# Patient Record
Sex: Female | Born: 1960 | Race: Black or African American | Hispanic: No | Marital: Single | Smoking: Never smoker
Health system: Southern US, Community
[De-identification: ages and names within clinical notes are randomized; demographics above are authoritative.]

## PROBLEM LIST (undated history)

## (undated) DIAGNOSIS — D509 Iron deficiency anemia, unspecified: Secondary | ICD-10-CM

## (undated) DIAGNOSIS — R590 Localized enlarged lymph nodes: Secondary | ICD-10-CM

## (undated) DIAGNOSIS — M1990 Unspecified osteoarthritis, unspecified site: Secondary | ICD-10-CM

## (undated) DIAGNOSIS — C541 Malignant neoplasm of endometrium: Secondary | ICD-10-CM

## (undated) DIAGNOSIS — E785 Hyperlipidemia, unspecified: Secondary | ICD-10-CM

## (undated) DIAGNOSIS — G4730 Sleep apnea, unspecified: Secondary | ICD-10-CM

## (undated) DIAGNOSIS — K921 Melena: Secondary | ICD-10-CM

## (undated) DIAGNOSIS — K219 Gastro-esophageal reflux disease without esophagitis: Secondary | ICD-10-CM

## (undated) DIAGNOSIS — E669 Obesity, unspecified: Secondary | ICD-10-CM

## (undated) DIAGNOSIS — E119 Type 2 diabetes mellitus without complications: Secondary | ICD-10-CM

## (undated) DIAGNOSIS — I10 Essential (primary) hypertension: Secondary | ICD-10-CM

## (undated) DIAGNOSIS — K589 Irritable bowel syndrome without diarrhea: Secondary | ICD-10-CM

## (undated) HISTORY — DX: Essential (primary) hypertension: I10

## (undated) HISTORY — DX: Malignant neoplasm of endometrium: C54.1

## (undated) HISTORY — DX: Localized enlarged lymph nodes: R59.0

## (undated) HISTORY — DX: Unspecified osteoarthritis, unspecified site: M19.90

## (undated) HISTORY — DX: Gastro-esophageal reflux disease without esophagitis: K21.9

## (undated) HISTORY — DX: Iron deficiency anemia, unspecified: D50.9

## (undated) HISTORY — DX: Obesity, unspecified: E66.9

## (undated) HISTORY — DX: Type 2 diabetes mellitus without complications: E11.9

## (undated) HISTORY — DX: Melena: K92.1

## (undated) HISTORY — DX: Irritable bowel syndrome, unspecified: K58.9

## (undated) HISTORY — DX: Hyperlipidemia, unspecified: E78.5

## (undated) MED FILL — Ferumoxytol Inj 510 MG/17ML (30 MG/ML) (Elemental Fe): INTRAVENOUS | Qty: 17 | Status: AC

---

## 2003-11-17 ENCOUNTER — Other Ambulatory Visit: Payer: Self-pay

## 2004-01-01 ENCOUNTER — Ambulatory Visit: Payer: Self-pay | Admitting: Internal Medicine

## 2004-01-18 ENCOUNTER — Inpatient Hospital Stay: Payer: Self-pay | Admitting: Internal Medicine

## 2004-02-25 ENCOUNTER — Inpatient Hospital Stay: Payer: Self-pay | Admitting: Internal Medicine

## 2004-04-14 ENCOUNTER — Emergency Department: Payer: Self-pay | Admitting: Emergency Medicine

## 2004-06-26 ENCOUNTER — Inpatient Hospital Stay: Payer: Self-pay | Admitting: Internal Medicine

## 2004-08-10 ENCOUNTER — Emergency Department: Payer: Self-pay | Admitting: Emergency Medicine

## 2004-08-10 ENCOUNTER — Inpatient Hospital Stay: Payer: Self-pay | Admitting: Internal Medicine

## 2004-08-10 ENCOUNTER — Other Ambulatory Visit: Payer: Self-pay

## 2005-01-09 ENCOUNTER — Ambulatory Visit: Payer: Self-pay | Admitting: Internal Medicine

## 2006-03-26 ENCOUNTER — Ambulatory Visit: Payer: Self-pay | Admitting: Internal Medicine

## 2006-07-08 ENCOUNTER — Ambulatory Visit: Payer: Self-pay | Admitting: Internal Medicine

## 2007-03-19 ENCOUNTER — Ambulatory Visit: Payer: Self-pay | Admitting: Internal Medicine

## 2007-09-06 ENCOUNTER — Ambulatory Visit: Payer: Self-pay | Admitting: Internal Medicine

## 2007-09-14 ENCOUNTER — Ambulatory Visit: Payer: Self-pay | Admitting: Internal Medicine

## 2008-09-14 ENCOUNTER — Ambulatory Visit: Payer: Self-pay | Admitting: Internal Medicine

## 2008-12-04 ENCOUNTER — Emergency Department: Payer: Self-pay | Admitting: Emergency Medicine

## 2009-08-28 ENCOUNTER — Ambulatory Visit: Payer: Self-pay | Admitting: Internal Medicine

## 2009-09-18 ENCOUNTER — Ambulatory Visit: Payer: Self-pay | Admitting: Internal Medicine

## 2009-09-24 ENCOUNTER — Ambulatory Visit: Payer: Self-pay | Admitting: Internal Medicine

## 2009-10-01 ENCOUNTER — Ambulatory Visit: Payer: Self-pay | Admitting: Internal Medicine

## 2010-12-25 ENCOUNTER — Ambulatory Visit: Payer: Self-pay | Admitting: Unknown Physician Specialty

## 2010-12-27 LAB — PATHOLOGY REPORT

## 2011-03-07 ENCOUNTER — Ambulatory Visit: Payer: Self-pay | Admitting: Gynecologic Oncology

## 2011-03-13 ENCOUNTER — Ambulatory Visit: Payer: Self-pay | Admitting: Obstetrics and Gynecology

## 2011-03-25 ENCOUNTER — Ambulatory Visit: Payer: Self-pay | Admitting: Gynecologic Oncology

## 2011-04-01 ENCOUNTER — Ambulatory Visit: Payer: Self-pay | Admitting: Gynecologic Oncology

## 2011-04-01 LAB — BASIC METABOLIC PANEL
Anion Gap: 11 (ref 7–16)
BUN: 10 mg/dL (ref 7–18)
Calcium, Total: 9 mg/dL (ref 8.5–10.1)
Co2: 28 mmol/L (ref 21–32)
Creatinine: 0.6 mg/dL (ref 0.60–1.30)
EGFR (African American): >60
EGFR (Non-African Amer.): >60
Osmolality: 282 (ref 275–301)
Sodium: 140 mmol/L (ref 136–145)

## 2011-04-04 ENCOUNTER — Ambulatory Visit: Payer: Self-pay | Admitting: Gynecologic Oncology

## 2011-04-04 HISTORY — PX: ABDOMINAL HYSTERECTOMY: SHX81

## 2011-04-08 ENCOUNTER — Inpatient Hospital Stay: Payer: Self-pay | Admitting: Obstetrics and Gynecology

## 2011-04-09 LAB — BASIC METABOLIC PANEL
BUN: 9 mg/dL (ref 7–18)
Calcium, Total: 7.7 mg/dL — ABNORMAL LOW (ref 8.5–10.1)
Co2: 29 mmol/L (ref 21–32)
Creatinine: 1.14 mg/dL (ref 0.60–1.30)
EGFR (Non-African Amer.): 54 — ABNORMAL LOW
Glucose: 163 mg/dL — ABNORMAL HIGH (ref 65–99)
Potassium: 4.1 mmol/L (ref 3.5–5.1)
Sodium: 141 mmol/L (ref 136–145)

## 2011-04-10 LAB — BASIC METABOLIC PANEL
Anion Gap: 10 (ref 7–16)
Chloride: 101 mmol/L (ref 98–107)
Co2: 27 mmol/L (ref 21–32)
Creatinine: 1.14 mg/dL (ref 0.60–1.30)
EGFR (African American): >60
Glucose: 194 mg/dL — ABNORMAL HIGH (ref 65–99)
Osmolality: 280 (ref 275–301)

## 2011-04-12 LAB — CBC WITH DIFFERENTIAL/PLATELET
Basophil #: 0 10*3/uL (ref 0.0–0.1)
Basophil %: 0.2 %
Eosinophil #: 0.8 10*3/uL — ABNORMAL HIGH (ref 0.0–0.7)
HCT: 26.7 % — ABNORMAL LOW (ref 35.0–47.0)
HGB: 8.4 g/dL — ABNORMAL LOW (ref 12.0–16.0)
Lymphocyte #: 2 10*3/uL (ref 1.0–3.6)
MCH: 22.4 pg — ABNORMAL LOW (ref 26.0–34.0)
MCHC: 31.5 g/dL — ABNORMAL LOW (ref 32.0–36.0)
MCV: 71 fL — ABNORMAL LOW (ref 80–100)
Monocyte %: 1.2 %
Neutrophil #: 17 10*3/uL — ABNORMAL HIGH (ref 1.4–6.5)
RDW: 16.6 % — ABNORMAL HIGH (ref 11.5–14.5)
WBC: 20.1 10*3/uL — ABNORMAL HIGH (ref 3.6–11.0)

## 2011-04-13 LAB — CBC WITH DIFFERENTIAL/PLATELET
Basophil #: 0 10*3/uL (ref 0.0–0.1)
Basophil %: 0.3 %
Eosinophil %: 5.5 %
HCT: 24.6 % — ABNORMAL LOW (ref 35.0–47.0)
HGB: 7.8 g/dL — ABNORMAL LOW (ref 12.0–16.0)
MCH: 22.6 pg — ABNORMAL LOW (ref 26.0–34.0)
MCV: 72 fL — ABNORMAL LOW (ref 80–100)
Monocyte #: 1.5 10*3/uL — ABNORMAL HIGH (ref 0.0–0.7)
Monocyte %: 9.7 %
Neutrophil #: 11.1 10*3/uL — ABNORMAL HIGH (ref 1.4–6.5)
Neutrophil %: 73.1 %
RBC: 3.44 10*6/uL — ABNORMAL LOW (ref 3.80–5.20)
WBC: 15.2 10*3/uL — ABNORMAL HIGH (ref 3.6–11.0)

## 2011-04-13 LAB — URINALYSIS, COMPLETE
Bilirubin,UR: NEGATIVE
Blood: NEGATIVE
Ketone: NEGATIVE
Leukocyte Esterase: NEGATIVE
Nitrite: NEGATIVE
Ph: 6 (ref 4.5–8.0)
Protein: NEGATIVE
RBC,UR: <1 /HPF (ref 0–5)
Squamous Epithelial: 2

## 2011-04-14 LAB — CBC WITH DIFFERENTIAL/PLATELET
Basophil %: 0 %
Eosinophil #: 0.2 10*3/uL (ref 0.0–0.7)
Eosinophil %: 1.5 %
HCT: 27.5 % — ABNORMAL LOW (ref 35.0–47.0)
Lymphocyte #: 1.6 10*3/uL (ref 1.0–3.6)
Lymphocyte %: 10.3 %
MCH: 22.2 pg — ABNORMAL LOW (ref 26.0–34.0)
MCHC: 31.2 g/dL — ABNORMAL LOW (ref 32.0–36.0)
Monocyte #: 1.5 10*3/uL — ABNORMAL HIGH (ref 0.0–0.7)
Monocyte %: 10 %
Neutrophil #: 12.1 10*3/uL — ABNORMAL HIGH (ref 1.4–6.5)
Neutrophil %: 78.2 %
Platelet: 559 10*3/uL — ABNORMAL HIGH (ref 150–440)
RBC: 3.86 10*6/uL (ref 3.80–5.20)
WBC: 15.4 10*3/uL — ABNORMAL HIGH (ref 3.6–11.0)

## 2011-04-14 LAB — CREATININE, SERUM
Creatinine: 1.05 mg/dL (ref 0.60–1.30)
EGFR (African American): >60
EGFR (Non-African Amer.): 59 — ABNORMAL LOW

## 2011-04-16 LAB — URINE CULTURE

## 2011-05-02 ENCOUNTER — Ambulatory Visit: Payer: Self-pay | Admitting: Gynecologic Oncology

## 2011-05-06 ENCOUNTER — Ambulatory Visit: Payer: Self-pay | Admitting: Gynecologic Oncology

## 2011-05-21 ENCOUNTER — Ambulatory Visit: Payer: Self-pay | Admitting: Specialist

## 2011-06-02 ENCOUNTER — Ambulatory Visit: Payer: Self-pay | Admitting: Gynecologic Oncology

## 2011-07-02 ENCOUNTER — Ambulatory Visit: Payer: Self-pay | Admitting: Gynecologic Oncology

## 2011-09-08 ENCOUNTER — Other Ambulatory Visit: Payer: Self-pay | Admitting: Unknown Physician Specialty

## 2011-09-15 ENCOUNTER — Ambulatory Visit: Payer: Self-pay | Admitting: Unknown Physician Specialty

## 2011-10-01 ENCOUNTER — Ambulatory Visit: Payer: Self-pay | Admitting: Unknown Physician Specialty

## 2011-10-07 ENCOUNTER — Ambulatory Visit: Payer: Self-pay | Admitting: Gynecologic Oncology

## 2011-10-31 ENCOUNTER — Ambulatory Visit: Payer: Self-pay | Admitting: Specialist

## 2011-11-02 ENCOUNTER — Ambulatory Visit: Payer: Self-pay | Admitting: Gynecologic Oncology

## 2012-01-09 ENCOUNTER — Emergency Department: Payer: Self-pay | Admitting: *Deleted

## 2012-01-28 ENCOUNTER — Ambulatory Visit: Payer: Self-pay | Admitting: Internal Medicine

## 2012-02-03 ENCOUNTER — Ambulatory Visit: Payer: Self-pay | Admitting: General Practice

## 2012-03-11 ENCOUNTER — Ambulatory Visit: Payer: Self-pay | Admitting: General Practice

## 2012-03-19 ENCOUNTER — Ambulatory Visit: Payer: Self-pay | Admitting: General Practice

## 2012-04-12 ENCOUNTER — Ambulatory Visit: Payer: Self-pay | Admitting: Gynecologic Oncology

## 2012-05-01 ENCOUNTER — Ambulatory Visit: Payer: Self-pay | Admitting: Gynecologic Oncology

## 2012-05-05 ENCOUNTER — Ambulatory Visit: Payer: Self-pay | Admitting: Specialist

## 2012-05-25 ENCOUNTER — Ambulatory Visit: Payer: Self-pay | Admitting: Specialist

## 2012-06-01 ENCOUNTER — Ambulatory Visit: Payer: Self-pay | Admitting: Gynecologic Oncology

## 2012-09-02 ENCOUNTER — Ambulatory Visit: Payer: Self-pay | Admitting: General Practice

## 2012-09-21 ENCOUNTER — Ambulatory Visit: Payer: Self-pay | Admitting: Internal Medicine

## 2012-09-21 ENCOUNTER — Emergency Department: Payer: Self-pay | Admitting: Emergency Medicine

## 2012-11-15 ENCOUNTER — Ambulatory Visit: Payer: Self-pay | Admitting: Gynecologic Oncology

## 2012-11-22 ENCOUNTER — Ambulatory Visit: Payer: Self-pay | Admitting: Gynecologic Oncology

## 2012-12-14 ENCOUNTER — Ambulatory Visit: Payer: Self-pay | Admitting: Gynecologic Oncology

## 2013-01-01 ENCOUNTER — Ambulatory Visit: Payer: Self-pay | Admitting: Gynecologic Oncology

## 2013-02-07 ENCOUNTER — Ambulatory Visit: Payer: Self-pay | Admitting: Internal Medicine

## 2013-03-04 ENCOUNTER — Emergency Department: Payer: Self-pay | Admitting: Emergency Medicine

## 2013-03-04 LAB — URINALYSIS, COMPLETE
BILIRUBIN, UR: NEGATIVE
Blood: NEGATIVE
Glucose,UR: NEGATIVE mg/dL (ref 0–75)
KETONE: NEGATIVE
Leukocyte Esterase: NEGATIVE
Nitrite: NEGATIVE
Ph: 5 (ref 4.5–8.0)
SPECIFIC GRAVITY: 1.01 (ref 1.003–1.030)
Squamous Epithelial: 2

## 2013-03-04 LAB — RAPID INFLUENZA A&B ANTIGENS

## 2013-04-07 ENCOUNTER — Ambulatory Visit: Payer: Self-pay | Admitting: Internal Medicine

## 2013-05-01 ENCOUNTER — Ambulatory Visit: Payer: Self-pay | Admitting: Internal Medicine

## 2013-05-23 ENCOUNTER — Ambulatory Visit: Payer: Self-pay | Admitting: Unknown Physician Specialty

## 2013-06-01 ENCOUNTER — Ambulatory Visit: Payer: Self-pay | Admitting: Unknown Physician Specialty

## 2013-06-03 ENCOUNTER — Emergency Department: Payer: Self-pay | Admitting: Emergency Medicine

## 2013-06-03 LAB — URINALYSIS, COMPLETE
Bilirubin,UR: NEGATIVE
Blood: NEGATIVE
GLUCOSE, UR: NEGATIVE mg/dL (ref 0–75)
KETONE: NEGATIVE
Nitrite: NEGATIVE
PH: 6 (ref 4.5–8.0)
RBC,UR: 3 /HPF (ref 0–5)
Specific Gravity: 1.016 (ref 1.003–1.030)
Squamous Epithelial: 7

## 2013-06-03 LAB — CBC
HCT: 39.7 % (ref 35.0–47.0)
HGB: 12.4 g/dL (ref 12.0–16.0)
MCH: 23.4 pg — ABNORMAL LOW (ref 26.0–34.0)
MCHC: 31.2 g/dL — AB (ref 32.0–36.0)
MCV: 75 fL — ABNORMAL LOW (ref 80–100)
PLATELETS: 484 10*3/uL — AB (ref 150–440)
RBC: 5.3 10*6/uL — ABNORMAL HIGH (ref 3.80–5.20)
RDW: 20.5 % — AB (ref 11.5–14.5)
WBC: 14.6 10*3/uL — ABNORMAL HIGH (ref 3.6–11.0)

## 2013-06-03 LAB — COMPREHENSIVE METABOLIC PANEL
AST: 38 U/L — AB (ref 15–37)
Albumin: 3.6 g/dL (ref 3.4–5.0)
Alkaline Phosphatase: 134 U/L — ABNORMAL HIGH
Anion Gap: 5 — ABNORMAL LOW (ref 7–16)
BUN: 6 mg/dL — AB (ref 7–18)
Bilirubin,Total: 0.4 mg/dL (ref 0.2–1.0)
CHLORIDE: 106 mmol/L (ref 98–107)
Calcium, Total: 8.8 mg/dL (ref 8.5–10.1)
Co2: 30 mmol/L (ref 21–32)
Creatinine: 0.59 mg/dL — ABNORMAL LOW (ref 0.60–1.30)
EGFR (African American): >60
EGFR (Non-African Amer.): >60
Glucose: 59 mg/dL — ABNORMAL LOW (ref 65–99)
Osmolality: 277 (ref 275–301)
Potassium: 3.7 mmol/L (ref 3.5–5.1)
SGPT (ALT): 44 U/L (ref 12–78)
SODIUM: 141 mmol/L (ref 136–145)
Total Protein: 8.8 g/dL — ABNORMAL HIGH (ref 6.4–8.2)

## 2013-07-01 ENCOUNTER — Ambulatory Visit: Payer: Self-pay | Admitting: Unknown Physician Specialty

## 2013-07-14 ENCOUNTER — Ambulatory Visit: Payer: Self-pay | Admitting: Internal Medicine

## 2013-07-15 LAB — CBC CANCER CENTER
BASOS PCT: 0.4 %
Basophil #: 0.1 x10 3/mm (ref 0.0–0.1)
EOS PCT: 2.9 %
Eosinophil #: 0.4 x10 3/mm (ref 0.0–0.7)
HCT: 39.5 % (ref 35.0–47.0)
HGB: 13 g/dL (ref 12.0–16.0)
LYMPHS PCT: 10.6 %
Lymphocyte #: 1.4 x10 3/mm (ref 1.0–3.6)
MCH: 25 pg — ABNORMAL LOW (ref 26.0–34.0)
MCHC: 32.9 g/dL (ref 32.0–36.0)
MCV: 76 fL — ABNORMAL LOW (ref 80–100)
MONO ABS: 0.7 x10 3/mm (ref 0.2–0.9)
Monocyte %: 5.6 %
NEUTROS ABS: 10.4 x10 3/mm — AB (ref 1.4–6.5)
Neutrophil %: 80.5 %
Platelet: 473 x10 3/mm — ABNORMAL HIGH (ref 150–440)
RBC: 5.2 10*6/uL (ref 3.80–5.20)
RDW: 17.2 % — ABNORMAL HIGH (ref 11.5–14.5)
WBC: 12.9 x10 3/mm — ABNORMAL HIGH (ref 3.6–11.0)

## 2013-07-15 LAB — IRON AND TIBC
Iron Bind.Cap.(Total): 346 ug/dL (ref 250–450)
Iron Saturation: 21 %
Iron: 74 ug/dL (ref 50–170)
UNBOUND IRON-BIND. CAP.: 272 ug/dL

## 2013-07-15 LAB — FERRITIN: Ferritin (ARMC): 240 ng/mL (ref 8–388)

## 2013-07-28 DIAGNOSIS — M1990 Unspecified osteoarthritis, unspecified site: Secondary | ICD-10-CM | POA: Insufficient documentation

## 2013-07-28 DIAGNOSIS — E785 Hyperlipidemia, unspecified: Secondary | ICD-10-CM | POA: Insufficient documentation

## 2013-07-28 DIAGNOSIS — G4730 Sleep apnea, unspecified: Secondary | ICD-10-CM | POA: Insufficient documentation

## 2013-07-28 DIAGNOSIS — E119 Type 2 diabetes mellitus without complications: Secondary | ICD-10-CM | POA: Insufficient documentation

## 2013-07-28 DIAGNOSIS — E669 Obesity, unspecified: Secondary | ICD-10-CM | POA: Insufficient documentation

## 2013-07-28 DIAGNOSIS — G4733 Obstructive sleep apnea (adult) (pediatric): Secondary | ICD-10-CM | POA: Insufficient documentation

## 2013-07-28 DIAGNOSIS — I10 Essential (primary) hypertension: Secondary | ICD-10-CM | POA: Insufficient documentation

## 2013-07-28 DIAGNOSIS — E041 Nontoxic single thyroid nodule: Secondary | ICD-10-CM | POA: Insufficient documentation

## 2013-08-01 ENCOUNTER — Ambulatory Visit: Payer: Self-pay | Admitting: Unknown Physician Specialty

## 2013-08-01 ENCOUNTER — Ambulatory Visit: Payer: Self-pay | Admitting: Internal Medicine

## 2013-08-31 ENCOUNTER — Ambulatory Visit: Payer: Self-pay | Admitting: Internal Medicine

## 2013-10-26 ENCOUNTER — Ambulatory Visit: Payer: Self-pay | Admitting: Internal Medicine

## 2013-11-01 ENCOUNTER — Ambulatory Visit: Payer: Self-pay | Admitting: Internal Medicine

## 2013-12-06 ENCOUNTER — Ambulatory Visit: Payer: Self-pay | Admitting: Internal Medicine

## 2013-12-12 DIAGNOSIS — R809 Proteinuria, unspecified: Secondary | ICD-10-CM | POA: Insufficient documentation

## 2013-12-12 DIAGNOSIS — Z794 Long term (current) use of insulin: Secondary | ICD-10-CM | POA: Insufficient documentation

## 2014-01-01 ENCOUNTER — Ambulatory Visit: Payer: Self-pay | Admitting: Internal Medicine

## 2014-01-02 ENCOUNTER — Ambulatory Visit: Payer: Self-pay | Admitting: Internal Medicine

## 2014-01-02 LAB — CBC CANCER CENTER
Basophil #: 0.1 "x10 3/mm "
Basophil %: 0.5 %
Eosinophil #: 0.4 "x10 3/mm "
Eosinophil %: 3.3 %
HCT: 37.3 %
HGB: 11.6 g/dL — ABNORMAL LOW
Lymphocyte %: 11.7 %
Lymphs Abs: 1.4 "x10 3/mm "
MCH: 25.4 pg — ABNORMAL LOW
MCHC: 31 g/dL — ABNORMAL LOW
MCV: 82 fL
Monocyte #: 0.5 "x10 3/mm "
Monocyte %: 4.1 %
Neutrophil #: 9.9 "x10 3/mm " — ABNORMAL HIGH
Neutrophil %: 80.4 %
Platelet: 465 "x10 3/mm " — ABNORMAL HIGH
RBC: 4.56 "x10 6/mm "
RDW: 14.4 %
WBC: 12.3 "x10 3/mm " — ABNORMAL HIGH

## 2014-01-02 LAB — IRON AND TIBC
IRON: 56 ug/dL (ref 50–170)
Iron Bind.Cap.(Total): 312 ug/dL (ref 250–450)
Iron Saturation: 18 %
Unbound Iron-Bind.Cap.: 256 ug/dL

## 2014-01-02 LAB — FERRITIN: Ferritin (ARMC): 189 ng/mL

## 2014-01-31 ENCOUNTER — Ambulatory Visit: Payer: Self-pay | Admitting: Internal Medicine

## 2014-02-09 ENCOUNTER — Ambulatory Visit: Payer: Self-pay | Admitting: Internal Medicine

## 2014-03-17 ENCOUNTER — Ambulatory Visit: Payer: Self-pay | Admitting: Internal Medicine

## 2014-03-17 LAB — CBC CANCER CENTER
BASOS ABS: 0.1 x10 3/mm (ref 0.0–0.1)
Basophil %: 0.7 %
EOS PCT: 3 %
Eosinophil #: 0.3 x10 3/mm (ref 0.0–0.7)
HCT: 39.1 % (ref 35.0–47.0)
HGB: 12.3 g/dL (ref 12.0–16.0)
LYMPHS ABS: 1.5 x10 3/mm (ref 1.0–3.6)
LYMPHS PCT: 14.9 %
MCH: 24.9 pg — AB (ref 26.0–34.0)
MCHC: 31.6 g/dL — AB (ref 32.0–36.0)
MCV: 79 fL — ABNORMAL LOW (ref 80–100)
Monocyte #: 0.5 x10 3/mm (ref 0.2–0.9)
Monocyte %: 5.2 %
NEUTROS PCT: 76.2 %
Neutrophil #: 7.6 x10 3/mm — ABNORMAL HIGH (ref 1.4–6.5)
PLATELETS: 482 x10 3/mm — AB (ref 150–440)
RBC: 4.96 10*6/uL (ref 3.80–5.20)
RDW: 14.7 % — ABNORMAL HIGH (ref 11.5–14.5)
WBC: 9.9 x10 3/mm (ref 3.6–11.0)

## 2014-03-17 LAB — IRON AND TIBC
IRON SATURATION: 25 %
Iron Bind.Cap.(Total): 350 ug/dL (ref 250–450)
Iron: 88 ug/dL (ref 50–170)
UNBOUND IRON-BIND. CAP.: 262 ug/dL

## 2014-03-17 LAB — FERRITIN: Ferritin (ARMC): 176 ng/mL (ref 8–388)

## 2014-04-03 ENCOUNTER — Ambulatory Visit: Payer: Self-pay | Admitting: Internal Medicine

## 2014-06-23 NOTE — Op Note (Signed)
PATIENT NAME:  Christine Lawson, Christine Lawson MR#:  [id number] DATE OF BIRTH:  1960/03/05  DATE OF PROCEDURE:  03/19/2012.  PREOPERATIVE DIAGNOSIS: Internal derangement of the right knee.   POSTOPERATIVE DIAGNOSES:  1. Tear of the posterior horn medial meniscus, right knee.  2. 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   PROCEDURES PERFORMED: Right knee arthroscopy, partial medial meniscectomy, and medial chondroplasty.   SURGEON: Laurice Record. Holley Bouche., MD.   ANESTHESIA: General.   ESTIMATED BLOOD LOSS: Minimal.   TOURNIQUET TIME: Not used.   DRAINS: None.   INDICATIONS FOR SURGERY: The patient is a 54 year old female who has been seen for complaints of right knee pain. MRI demonstrated findings consistent with meniscal pathology. After a discussion of the risks and benefits of surgical intervention, the patient expressed her understanding of the risks, benefits, and agreed with plans for surgical intervention.   PROCEDURE IN DETAIL: The patient was brought into the Operating Room and, after adequate general anesthesia was achieved, a tourniquet was placed on the patient's right thigh and the leg was placed in a leg holder. All bony prominences were well padded. The patient's right knee and leg were cleaned and prepped with alcohol and DuraPrep and draped in the usual sterile fashion. A "time-out" was performed as per usual protocol. The anticipated portal sites were injected with 0.25% Marcaine with epinephrine. An anterolateral portal was created and a cannula was inserted. The scope was inserted and the knee was distended with fluid using the Stryker pump. The scope was advanced down the medial gutter into the medial compartment of the knee. Under visualization with the scope, an anteromedial portal was created and a hook probe was inserted. Inspection of the medial compartment demonstrated a tear of the posterior horn medial meniscus with fraying and a small flap component. The area  of the tear was debrided using meniscal punches and a 4.5 mm shaver. Final contouring was performed using the 50 degrees ArthroCare wand. The scope was then positioned so as to visualize the anterior horn which was probed and felt to be stable. Inspection of the articular cartilage demonstrated areas of grade III chondromalacia involving the medial femoral condyle. These were debrided and contoured using the ArthroCare wand. The scope was then advanced into the intercondylar region. The anterior cruciate ligament was visualized and probed and felt to be stable. The scope was removed from the anterolateral portal and reinserted via the anteromedial portal so as to better visualize the lateral compartment. The articular surface was in good condition. The lateral meniscus was visualized and probed and felt to be stable. Finally, the scope was positioned so as to visualize the patellofemoral articulation. The articular surface was in good condition. Good patellar tracking was noted.  The knee was irrigated with copious amounts of fluid and then suctioned dry. The anterolateral portal was reapproximated using #3-0 nylon. A combination of 0.25% Marcaine with epinephrine and 4 mg of morphine was injected via the scope. The scope was removed and the anteromedial portal was reapproximated using #3-0 nylon. A sterile dressing was applied followed by application of an ice wrap.   The patient tolerated the procedure well. She was transported to the recovery room in stable condition.   ____________________________ Laurice Record. Holley Bouche., MD jph:jm D: 03/20/2012 08:30:55 ET T: 03/20/2012 11:22:39 ET JOB#: [id number]  cc: Jeneen Rinks P. Holley Bouche., MD, <Dictator> JAMES P Holley Bouche MD ELECTRONICALLY SIGNED 03/21/2012 22:02

## 2014-06-25 NOTE — Op Note (Signed)
PATIENT NAME:  Christine Lawson, Christine Lawson MR#:  [id number] DATE OF BIRTH:  Dec 13, 1960  DATE OF PROCEDURE:  04/08/2011  PREOPERATIVE DIAGNOSIS: Endometrial adenocarcinoma.   POSTOPERATIVE DIAGNOSIS:  Endometrial adenocarcinoma.  PROCEDURE: Total abdominal hysterectomy, bilateral salpingo-oophorectomy, lymph node sampling.  CO-SURGEONS:  Dr. Ouida Sills, Dr. Jacquelyne Balint  ANESTHESIA:  General endotracheal anesthesia.    INDICATIONS: This is a 54 year old gravida 0 patient who underwent an office biopsy of endometrial polyp that showed adenocarcinoma well-differentiated.   PROCEDURE: After adequate general endotracheal anesthesia, the patient was placed in the dorsal supine position. Legs were placed in the [place]. Abdominal, pelvic prep performed, sterilely draped. Foley catheter was placed. The patient did receive 2 grams IV cefoxitin prior to commencement of the case. A vertical skin incision was made from the symphysis pubis to the inferior aspect of the umbilicus. Fascia was identified and opened and the peritoneum was opened sharply. The Bookwalter retractor was brought to the operative field. Initial evaluation by Dr. Sabra Heck demonstrated no adenopathy, no pelvic masses. Cell washings were performed and two large Kelly clamps were placed on the cornua. The bowel was retracted cephalad. The round ligaments on both sides were clamped, transected, and suture ligated with 0 Vicryl suture. The anterior leaf of the broad ligament was incised along the bladder reflection to the midline from both sides and the bladder was then gently dissected off the lower uterine segment with sharp dissection. After identifying the ureters bilaterally, the infundibulopelvic ligaments were bilaterally doubly clamped, transected, and suture ligated with 0 Vicryl suture, the proximal pedicle with two separate sutures. Good hemostasis was noted. Uterine arteries were then bilaterally skeletonized, clamped with Heaney  clamps, transected, and suture ligated with 0 Vicryl suture. Uterosacral ligaments were clamped on both sides, transected, and suture ligated, curved Heaney clamps used at the vaginal angles, and the cervix was delivered intact. Vaginal angles were closed with 0 Vicryl suture. The rest of the vaginal cuff was closed with interrupted 0 Vicryl suture. Good hemostasis was noted.  At this point, Dr. Jacquelyne Balint took over as primary surgeon and dissected lymph nodes from the pelvic area bilaterally. Please reference her separate operative report. After Dr. Sabra Heck completed her dissection, the abdomen was copiously irrigated. Frozen section returned superficial invasion well-differentiated adenocarcinoma. Good hemostasis was noted. FloSeal was placed in the deep pelvic areas. All instruments were removed. Sponge and needle counts were correct. The fascia was closed with 1 PDS suture. The subcutaneous tissues were then irrigated and bovied for hemostasis. Given the depth of the subcutaneous tissues, dead space was closed with a running 2-0 chromic suture. The skin was reapproximated with staples. Estimated blood loss was 250 mL. Intraoperative fluids 1600 mL. Urine output 250 mL. The patient tolerated the procedure well and was taken to the recovery room in good condition.     ____________________________ Boykin Nearing, MD tjs:bjt D: 04/08/2011 15:32:27 ET T: 04/08/2011 16:12:55 ET JOB#: [id number]  cc: Boykin Nearing, MD, <Dictator> Boykin Nearing MD ELECTRONICALLY SIGNED 04/09/2011 9:24

## 2014-06-25 NOTE — Op Note (Signed)
PATIENT NAME:  Christine Lawson, Christine Lawson MR#:  [id number] DATE OF BIRTH:  11-Oct-1960  DATE OF PROCEDURE:  04/08/2011  PREOPERATIVE DIAGNOSIS: Adenocarcinoma of the endometrium.   POSTOPERATIVE DIAGNOSIS: Adenocarcinoma of the endometrium, stage I-A.   PROCEDURES PERFORMED:  1. Exploratory laparotomy. 2. Total abdominal hysterectomy. 3. Bilateral salpingo-oophorectomy.  4. Pelvic lymphadenectomy.   CO-SURGEONS: Jacquelyne Balint, MD and Laverta Baltimore, MD    ANESTHESIA: General.   ESTIMATED BLOOD LOSS: 250 mL.   COMPLICATIONS: None.   INDICATION FOR SURGERY: Ms. Prevo is a 54 year old patient who complained of irregular uterine bleeding and on endometrial biopsy was noted to have a well-differentiated endometrioid adenocarcinoma within polyp. Work-up failed to reveal any evidence of metastatic disease. Therefore, decision was made to proceed with surgery.   FINDINGS AT TIME OF SURGERY: Uterus of normal form. Adnexa within normal limits. No peritoneal lesions seen. Pelvic lymph nodes slightly enlarged. Inspection and palpation of the upper abdomen unremarkable. Frozen section analysis reveals a polypoid tumor 3 and 4 cm in diameter with only superficial invasion.   OPERATIVE REPORT: After adequate general anesthesia had been obtained, the patient was prepped and draped in supine position. A midline incision was placed with a sharp knife and carried down through the fascia. The peritoneum was entered. Incision was extended cephalad and caudad. Cytology was obtained. Exploration was done with the above-mentioned findings.   The hysterectomy is dictated by Dr. Ouida Sills.   Attention was then directed towards the lymphadenectomy. This was done in the same fashion on either pelvic sidewall. Using the Harmonic scalpel, the node bearing fatty tissue around the lower common iliac velles, the external iliac vessels, the hypogastric artery and from the obturator space were removed. Hemostasis was  noted to be adequate at the end of the procedure.   Irrigation was performed and adequate hemostasis confirmed in all areas. The remainder of the report is dictated by Dr. Ouida Sills.   ____________________________ Weber Cooks, MD bem:drc D: 04/08/2011 18:26:34 ET T: 04/09/2011 08:23:20 ET JOB#: [id number]  cc: Weber Cooks, MD, <Dictator> Weber Cooks MD ELECTRONICALLY SIGNED 04/15/2011 18:40

## 2014-06-25 NOTE — Discharge Summary (Signed)
PATIENT NAME:  Christine Lawson, Christine Lawson MR#:  [id number] DATE OF BIRTH:  04/01/1960  DATE OF ADMISSION:  04/08/2011 DATE OF DISCHARGE:  04/15/2011   PRINCIPLE PROCEDURES: Exploratory laparotomy, total abdominal hysterectomy, bilateral salpingo-oophorectomy, and lymph node sampling for endometrial cancer.  HOSPITAL COURSE: The patient's postoperative course was eventful for hypoxia which warranted a chest x-ray and a CT scan of her lungs that ruled out a pulmonary embolism. The patient remained on nasal cannula. The patient was seen by Dr. Raul Del, pulmonologist, who felt that potentially she had aspiration pneumonia versus atelectasis. Ultimately, Dr. Raul Del placed her on Augmentin 875 mg twice a day. The patient remained afebrile and did have a mild leukocytosis. The patient's insulin was restarted once she tolerated clear liquids and regular food postop day #1 and sugars were stable on discharge. She was eating solid foods, passing gas. No nausea or vomiting. Echocardiogram was done the day of discharge and results are pending.   CONDITION ON DISCHARGE: She is discharged to home in stable condition.   DISCHARGE MEDICATIONS: She will be discharged to home with: 1. Supplemental oxygen nasal cannula 2 liters a day. 2. Augmentin 875 mg twice a day.  3. Norco 5/325 q.4 to 6 hours as needed for pain. 4. The patient will remain on all other home medications.  5. She will decrease her insulin dosing to which she was taking here which is 30 units Lantus q.a.m., metformin 500 mg twice a day, and 6 units regular before her meals until she gets back to normal full diet and then she can advance to what she was on prehospitalization.   FOLLOW-UP:  1. The patient will return to the clinic to see Dr. Ouida Sills Friday for staple removal or before if she has nausea, vomiting, wound drainage, fever. 2. The patient will follow-up with Dr. Raul Del on Monday.   ____________________________ Boykin Nearing,  MD tjs:drc D: 04/15/2011 17:15:51 ET T: 04/15/2011 17:31:15 ET JOB#: [id number] Boykin Nearing MD ELECTRONICALLY SIGNED 04/16/2011 8:49

## 2014-08-09 ENCOUNTER — Ambulatory Visit: Payer: Medicare Other

## 2014-08-30 ENCOUNTER — Ambulatory Visit: Payer: Medicare Other

## 2014-09-05 ENCOUNTER — Other Ambulatory Visit: Payer: Self-pay

## 2014-09-05 ENCOUNTER — Inpatient Hospital Stay: Payer: Medicare Other | Admitting: Internal Medicine

## 2014-09-05 ENCOUNTER — Inpatient Hospital Stay: Payer: Medicare Other

## 2014-09-05 DIAGNOSIS — C801 Malignant (primary) neoplasm, unspecified: Secondary | ICD-10-CM

## 2014-09-13 ENCOUNTER — Ambulatory Visit: Payer: Medicare Other

## 2014-09-15 ENCOUNTER — Encounter: Payer: Self-pay | Admitting: Internal Medicine

## 2014-09-15 ENCOUNTER — Inpatient Hospital Stay: Payer: Medicare Other

## 2014-09-15 ENCOUNTER — Inpatient Hospital Stay: Payer: Medicare Other | Attending: Internal Medicine | Admitting: Internal Medicine

## 2014-09-15 VITALS — BP 117/72 | HR 86 | Temp 98.7°F | Resp 18 | Wt 253.3 lb

## 2014-09-15 DIAGNOSIS — Z79899 Other long term (current) drug therapy: Secondary | ICD-10-CM | POA: Diagnosis not present

## 2014-09-15 DIAGNOSIS — I10 Essential (primary) hypertension: Secondary | ICD-10-CM | POA: Diagnosis not present

## 2014-09-15 DIAGNOSIS — R5383 Other fatigue: Secondary | ICD-10-CM | POA: Insufficient documentation

## 2014-09-15 DIAGNOSIS — D509 Iron deficiency anemia, unspecified: Secondary | ICD-10-CM

## 2014-09-15 DIAGNOSIS — M1990 Unspecified osteoarthritis, unspecified site: Secondary | ICD-10-CM | POA: Insufficient documentation

## 2014-09-15 DIAGNOSIS — Z8542 Personal history of malignant neoplasm of other parts of uterus: Secondary | ICD-10-CM | POA: Diagnosis not present

## 2014-09-15 DIAGNOSIS — K589 Irritable bowel syndrome without diarrhea: Secondary | ICD-10-CM | POA: Diagnosis not present

## 2014-09-15 DIAGNOSIS — Z794 Long term (current) use of insulin: Secondary | ICD-10-CM

## 2014-09-15 DIAGNOSIS — D473 Essential (hemorrhagic) thrombocythemia: Secondary | ICD-10-CM | POA: Insufficient documentation

## 2014-09-15 DIAGNOSIS — E669 Obesity, unspecified: Secondary | ICD-10-CM | POA: Insufficient documentation

## 2014-09-15 DIAGNOSIS — K219 Gastro-esophageal reflux disease without esophagitis: Secondary | ICD-10-CM | POA: Insufficient documentation

## 2014-09-15 DIAGNOSIS — C801 Malignant (primary) neoplasm, unspecified: Secondary | ICD-10-CM

## 2014-09-15 DIAGNOSIS — E119 Type 2 diabetes mellitus without complications: Secondary | ICD-10-CM | POA: Diagnosis not present

## 2014-09-15 DIAGNOSIS — E785 Hyperlipidemia, unspecified: Secondary | ICD-10-CM

## 2014-09-15 LAB — CBC
HCT: 37.8 % (ref 35.0–47.0)
HEMOGLOBIN: 12.2 g/dL (ref 12.0–16.0)
MCH: 25.7 pg — ABNORMAL LOW (ref 26.0–34.0)
MCHC: 32.3 g/dL (ref 32.0–36.0)
MCV: 79.4 fL — AB (ref 80.0–100.0)
PLATELETS: 482 10*3/uL — AB (ref 150–440)
RBC: 4.76 MIL/uL (ref 3.80–5.20)
RDW: 14.6 % — AB (ref 11.5–14.5)
WBC: 10.7 10*3/uL (ref 3.6–11.0)

## 2014-09-15 LAB — FERRITIN: Ferritin: 150 ng/mL (ref 11–307)

## 2014-09-15 LAB — IRON AND TIBC
IRON: 50 ug/dL (ref 28–170)
Saturation Ratios: 15 % (ref 10.4–31.8)
TIBC: 333 ug/dL (ref 250–450)
UIBC: 284 ug/dL

## 2014-09-15 NOTE — Progress Notes (Signed)
Pt here for f/u. Fatigue about the same. Gets tired on exertion. Saw blood in her stool today, she is followed by dr Vira Agar as well as dr Ginette Pitman. Eating well and no pain. She has had loose bowels since 3 years ago.

## 2014-09-27 ENCOUNTER — Inpatient Hospital Stay: Payer: Medicare Other

## 2014-10-04 NOTE — Progress Notes (Signed)
[facility]  [phone] Fax:([phone]) [phone]     ID: Mora Bellman OB: [birth year]  MR#: [id number]  VZD#:[id number]  Patient Care Team: Tracie Harrier, MD as PCP - General (Internal Medicine)  CHIEF COMPLAINT/DIAGNOSIS:  Anemia of iron deficiency unresponsive to oral iron therapy - got IV Venofer 1 g in Feb 2015, then on oral Ferrous sulfate 1 tablet daily. (labs on 03/28/13 had showed hemoglobin 10.0, MCV 71.3, WBC 10,800, ANC 7990, ALC 1640, platelet count 564, ferritin 27, serum iron 33, iron saturation low at 7%, TIBC 452. In June 2014 - hemoglobin 9.8, WBC 10,700, platelets 551, LFT unremarkable except alkaline phos of 120, creatinine 0.6). Stool Hemoccult was negative x2 in November 2014.  Colonoscopy July 2013 had shown internal hemorrhoids.    HISTORY OF PRESENT ILLNESS:  Patient fror hematology followup, she was last seen in May 2015. States she is doing the same, has mild fatigue on exertion but is physically active. She is taking oral iron 1 tablet daily. She denies any new dyspnea, orthopnea, PND. No new angina, palpitation, or dizziness. Denies any obvious bleeding symptoms including bright red blood in stools, melena, or hematuria. No new bone pains. Appetite is steady.   REVIEW OF SYSTEMS:   ROS As in HPI above. In addition, no fever, chills. No new headaches or focal weakness.  No new cough, shortness of breath, sputum, hemoptysis or chest pain. No dizziness or palpitation. No abdominal pain, constipation, diarrhea, dysuria or hematuria. No new skin rash. No new paresthesias in extremities.    PAST MEDICAL HISTORY: Reviewed. Past Medical History  Diagnosis Date  . Endometrial adenocarcinoma   . GERD (gastroesophageal reflux disease)   . Hypertension   . Diabetes mellitus without complication   . Obesity   . Irritable bowel syndrome   . Hematochezia   . IDA (iron deficiency anemia)   . Hyperlipidemia   . Osteoarthritis   Colonoscopy  October 2012 and more recently July 2013 showed internal hemorrhoids.     TAH/BSO February 2013 for stage Ia endometroid adenocarcinoma.  PAST SURGICAL HISTORY: Reviewed. Past Surgical History  Procedure Laterality Date  . Abdominal hysterectomy  feb 2013    FAMILY HISTORY: Reviewed. Remarkable for heart disease. Denies malignancy or hematological disorders.  SOCIAL HISTORY: Reviewed. History  Substance Use Topics  . Smoking status: Never Smoker   . Smokeless tobacco: Never Used  . Alcohol Use: No    Allergies  Allergen Reactions  . Isovue [Iopamidol] Other (See Comments)    Wheezing     Current Outpatient Prescriptions  Medication Sig Dispense Refill  . amLODipine (NORVASC) 10 MG tablet Take 10 mg by mouth daily.    . cholecalciferol (VITAMIN D) 1000 UNITS tablet Take 1,000 Units by mouth daily.    . enalapril (VASOTEC) 5 MG tablet Take 5 mg by mouth daily.    . insulin aspart (NOVOLOG) 100 UNIT/ML injection Inject 20 Units into the skin 3 (three) times daily before meals. 2o units in am, 26 units at lunch and 36 units at supper    . Insulin Glargine (LANTUS SOLOSTAR) 100 UNIT/ML Solostar Pen Inject 46 Units as directed at bedtime.    . metFORMIN (GLUCOPHAGE-XR) 500 MG 24 hr tablet Take 1 tablet by mouth 2 (two) times daily.    Marland Kitchen omeprazole (PRILOSEC) 40 MG capsule Take 40 mg by mouth 2 (two) times daily.    . ferrous sulfate 325 (65 FE) MG tablet Take 325 mg by mouth daily after breakfast.    .  Probiotic Product (SOLUBLE FIBER/PROBIOTICS PO) Take 1 Dose by mouth 3 (three) times daily.     No current facility-administered medications for this visit.    PHYSICAL EXAM: Filed Vitals:   09/15/14 1330  BP: 117/72  Pulse: 86  Temp: 98.7 F (37.1 C)  Resp: 18     There is no height on file to calculate BMI.    GENERAL: Patient is alert and oriented and in no acute distress. There is no icterus or pallor. HEENT: EOMs intact. No cervical lymphadenopathy. CVS: S1S2,  regular LUNGS: Bilaterally clear to auscultation, no rhonchi. ABDOMEN: Soft, nontender. No hepatosplenomegaly clinically.  EXTREMITIES: No pedal edema.  LAB RESULTS: Serum Fe 50, ferritin 150, iron sat 15%, TIBC 333.    Component Value Date/Time   NA 141 06/03/2013 2039   K 3.7 06/03/2013 2039   CL 106 06/03/2013 2039   CO2 30 06/03/2013 2039   GLUCOSE 59* 06/03/2013 2039   BUN 6* 06/03/2013 2039   CREATININE 0.59* 06/03/2013 2039   CALCIUM 8.8 06/03/2013 2039   PROT 8.8* 06/03/2013 2039   ALBUMIN 3.6 06/03/2013 2039   AST 38* 06/03/2013 2039   ALT 44 06/03/2013 2039   ALKPHOS 134* 06/03/2013 2039   BILITOT 0.4 06/03/2013 2039   GFRNONAA >60 06/03/2013 2039   GFRAA >60 06/03/2013 2039    Lab Results  Component Value Date   WBC 10.7 09/15/2014   NEUTROABS 7.6* 03/17/2014   HGB 12.2 09/15/2014   HCT 37.8 09/15/2014   MCV 79.4* 09/15/2014   PLT 482* 09/15/2014    Lab Results  Component Value Date   IRON 50 09/15/2014      STUDIES: 03/28/13 - Hb 10, WBC 10,800, MCV 71.3, platelets 564, ANC 7990, ALC 1640, ferritin 27, serum iron 33, iron saturation 7%, TIBC 452.  Stool Hemoccult negative x2, November 2014.  October 2014 - ferritin 23, B12 of 336, folate greater than 22.3.  October 2014 - Hb 9.6, WBC 10,400, platelets 530, creatinine 0.5.  June 2014 - Hb 9.8, WBC 10,700, platelets 551, creatinine 0.6, LFT unremarkable except alk phos of 120.    ASSESSMENT / PLAN:   1. Anemia of iron deficiency unresponsive to oral iron therapy (labs on 03/28/13 showed Hb 10.0, MCV 71.3, WBC 10,800, ANC 7990, ALC 1640, platelet count 564, ferritin 27, serum iron 33, iron saturation low at 7%, TIBC 452. In June 2014 - hemoglobin 9.8, WBC 10700, platelets 551, LFT unremarkable except alkaline phos of 120, creatinine 0.6)  -   Reviewed labs from today and d/w patient. Overall she is clinically doing steady, no progressive anemia symptoms. She does not have any obvious bleeding  symptoms. Labs shows Hb and iron study maintains in the normal range. She is currently tolerating oraliron ferrous sulfate 325 mg once daily, advised to continue on this. Will monitor Hemoglobin and iron study q24 weeks, and see her back at 52 weeks and make conitnued treatment planning. Patient explained that if she develops recurrent iron deficiency, then will need to resume on parenteral iron therapy. 2. Mild thrombocytosis - platelet count just above normal range, asymptomatic, monitor as above. 3. In between visits, she was advised to call or come to ER in case of any  new symptoms or acute sickness. She is agreeable to this plan.    Leia Alf, MD   10/04/2014 7:26 AM

## 2014-10-18 ENCOUNTER — Inpatient Hospital Stay: Payer: Medicare Other

## 2014-11-01 ENCOUNTER — Inpatient Hospital Stay: Payer: Medicare Other

## 2014-11-15 ENCOUNTER — Encounter: Payer: Self-pay | Admitting: Obstetrics and Gynecology

## 2014-11-15 ENCOUNTER — Inpatient Hospital Stay: Payer: Medicare Other | Attending: Obstetrics and Gynecology | Admitting: Obstetrics and Gynecology

## 2014-11-15 ENCOUNTER — Inpatient Hospital Stay: Payer: Medicare Other

## 2014-11-15 VITALS — BP 134/79 | HR 92 | Temp 97.1°F | Resp 18 | Ht 63.0 in | Wt 253.5 lb

## 2014-11-15 DIAGNOSIS — R197 Diarrhea, unspecified: Secondary | ICD-10-CM | POA: Diagnosis not present

## 2014-11-15 DIAGNOSIS — K589 Irritable bowel syndrome without diarrhea: Secondary | ICD-10-CM | POA: Diagnosis not present

## 2014-11-15 DIAGNOSIS — I10 Essential (primary) hypertension: Secondary | ICD-10-CM | POA: Insufficient documentation

## 2014-11-15 DIAGNOSIS — E785 Hyperlipidemia, unspecified: Secondary | ICD-10-CM | POA: Insufficient documentation

## 2014-11-15 DIAGNOSIS — Z90710 Acquired absence of both cervix and uterus: Secondary | ICD-10-CM | POA: Diagnosis not present

## 2014-11-15 DIAGNOSIS — Z8542 Personal history of malignant neoplasm of other parts of uterus: Secondary | ICD-10-CM | POA: Diagnosis present

## 2014-11-15 DIAGNOSIS — G4730 Sleep apnea, unspecified: Secondary | ICD-10-CM | POA: Insufficient documentation

## 2014-11-15 DIAGNOSIS — Z79899 Other long term (current) drug therapy: Secondary | ICD-10-CM | POA: Diagnosis not present

## 2014-11-15 DIAGNOSIS — R630 Anorexia: Secondary | ICD-10-CM | POA: Insufficient documentation

## 2014-11-15 DIAGNOSIS — Z794 Long term (current) use of insulin: Secondary | ICD-10-CM | POA: Diagnosis not present

## 2014-11-15 DIAGNOSIS — K219 Gastro-esophageal reflux disease without esophagitis: Secondary | ICD-10-CM | POA: Insufficient documentation

## 2014-11-15 DIAGNOSIS — C541 Malignant neoplasm of endometrium: Secondary | ICD-10-CM | POA: Diagnosis not present

## 2014-11-15 DIAGNOSIS — R140 Abdominal distension (gaseous): Secondary | ICD-10-CM | POA: Diagnosis not present

## 2014-11-15 DIAGNOSIS — R609 Edema, unspecified: Secondary | ICD-10-CM | POA: Insufficient documentation

## 2014-11-15 DIAGNOSIS — M1990 Unspecified osteoarthritis, unspecified site: Secondary | ICD-10-CM | POA: Insufficient documentation

## 2014-11-15 DIAGNOSIS — Z90722 Acquired absence of ovaries, bilateral: Secondary | ICD-10-CM | POA: Insufficient documentation

## 2014-11-15 DIAGNOSIS — E119 Type 2 diabetes mellitus without complications: Secondary | ICD-10-CM | POA: Insufficient documentation

## 2014-11-15 DIAGNOSIS — R109 Unspecified abdominal pain: Secondary | ICD-10-CM | POA: Insufficient documentation

## 2014-11-15 LAB — CREATININE, SERUM
Creatinine, Ser: 0.57 mg/dL (ref 0.44–1.00)
GFR calc Af Amer: >60 mL/min (ref >60)
GFR calc non Af Amer: >60 mL/min (ref >60)

## 2014-11-15 LAB — SEDIMENTATION RATE: Sed Rate: 51 mm/hr — ABNORMAL HIGH (ref 0–30)

## 2014-11-15 NOTE — Patient Instructions (Signed)
Diet and Irritable Bowel Syndrome  No cure has been found for irritable bowel syndrome (IBS). Many options are available to treat the symptoms. Your caregiver will give you the best treatments available for your symptoms. He or she will also encourage you to manage stress and to make changes to your diet. You need to work with your caregiver and Registered Dietician to find the best combination of medicine, diet, counseling, and support to control your symptoms. The following are some diet suggestions. FOODS THAT MAKE IBS WORSE  Fatty foods, such as [place] fries.  Milk products, such as cheese or ice cream.  Chocolate.  Alcohol.  Caffeine (found in coffee and some sodas).  Carbonated drinks, such as soda. If certain foods cause symptoms, you should eat less of them or stop eating them. FOOD JOURNAL   Keep a journal of the foods that seem to cause distress. Write down:  What you are eating during the day and when.  What problems you are having after eating.  When the symptoms occur in relation to your meals.  What foods always make you feel badly.  Take your notes with you to your caregiver to see if you should stop eating certain foods. FOODS THAT MAKE IBS BETTER Fiber reduces IBS symptoms, especially constipation, because it makes stools soft, bulky, and easier to pass. Fiber is found in bran, bread, cereal, beans, fruit, and vegetables. Examples of foods with fiber include:  Apples.  Peaches.  Pears.  Berries.  Figs.  Broccoli, raw.  Cabbage.  Carrots.  Raw peas.  Kidney beans.  Lima beans.  Whole-grain bread.  Whole-grain cereal. Add foods with fiber to your diet a little at a time. This will let your body get used to them. Too much fiber at once might cause gas and swelling of your abdomen. This can trigger symptoms in a person with IBS. Caregivers usually recommend a diet with enough fiber to produce soft, painless bowel movements. High fiber diets may  cause gas and bloating. However, these symptoms often go away within a few weeks, as your body adjusts. In many cases, dietary fiber may lessen IBS symptoms, particularly constipation. However, it may not help pain or diarrhea. High fiber diets keep the colon mildly enlarged (distended) with the added fiber. This may help prevent spasms in the colon. Some forms of fiber also keep water in the stool, thereby preventing hard stools that are difficult to pass.  Besides telling you to eat more foods with fiber, your caregiver may also tell you to get more fiber by taking a fiber pill or drinking water mixed with a special high fiber powder. An example of this is a natural fiber laxative containing psyllium seed.  TIPS  Large meals can cause cramping and diarrhea in people with IBS. If this happens to you, try eating 4 or 5 small meals a day, or try eating less at each of your usual 3 meals. It may also help if your meals are low in fat and high in carbohydrates. Examples of carbohydrates are pasta, rice, whole-grain breads and cereals, fruits, and vegetables.  If dairy products cause your symptoms to flare up, you can try eating less of those foods. You might be able to handle yogurt better than other dairy products, because it contains bacteria that helps with digestion. Dairy products are an important source of calcium and other nutrients. If you need to avoid dairy products, be sure to talk with a Registered Dietitian about getting these nutrients  through other food sources.  Drink enough water and fluids to keep your urine clear or pale yellow. This is important, especially if you have diarrhea. FOR MORE INFORMATION  International Foundation for Functional Gastrointestinal Disorders: www.iffgd.org  National Digestive Diseases Information Clearinghouse: digestive.AmenCredit.is Document Released: 05/10/2003 Document Revised: 05/12/2011 Document Reviewed: 05/20/2013 [facility] Patient Information 2015  [place], [place]. This information is not intended to replace advice given to you by your health care provider. Make sure you discuss any questions you have with your health care provider.   Exercise to Lose Weight Exercise and a healthy diet may help you lose weight. Your doctor may suggest specific exercises. EXERCISE IDEAS AND TIPS  Choose low-cost things you enjoy doing, such as walking, bicycling, or exercising to workout videos.  Take stairs instead of the elevator.  Walk during your lunch break.  Park your car further away from work or school.  Go to a gym or an exercise class.  Start with 5 to 10 minutes of exercise each day. Build up to 30 minutes of exercise 4 to 6 days a week.  Wear shoes with good support and comfortable clothes.  Stretch before and after working out.  Work out until you breathe harder and your heart beats faster.  Drink extra water when you exercise.  Do not do so much that you hurt yourself, feel dizzy, or get very short of breath. Exercises that burn about 150 calories:  Running 1  miles in 15 minutes.  Playing volleyball for 45 to 60 minutes.  Washing and waxing a car for 45 to 60 minutes.  Playing touch football for 45 minutes.  Walking 1  miles in 35 minutes.  Pushing a stroller 1  miles in 30 minutes.  Playing basketball for 30 minutes.  Raking leaves for 30 minutes.  Bicycling 5 miles in 30 minutes.  Walking 2 miles in 30 minutes.  Dancing for 30 minutes.  Shoveling snow for 15 minutes.  Swimming laps for 20 minutes.  Walking up stairs for 15 minutes.  Bicycling 4 miles in 15 minutes.  Gardening for 30 to 45 minutes.  Jumping rope for 15 minutes.  Washing windows or floors for 45 to 60 minutes. Document Released: 03/22/2010 Document Revised: 05/12/2011 Document Reviewed: 03/22/2010 [facility] Patient Information 2015 [place], [place]. This information is not intended to replace advice given to you by your health care  provider. Make sure you discuss any questions you have with your health care provider.    Calorie Counting for Weight Loss Calories are energy you get from the things you eat and drink. Your body uses this energy to keep you going throughout the day. The number of calories you eat affects your weight. When you eat more calories than your body needs, your body stores the extra calories as fat. When you eat fewer calories than your body needs, your body burns fat to get the energy it needs. Calorie counting means keeping track of how many calories you eat and drink each day. If you make sure to eat fewer calories than your body needs, you should lose weight. In order for calorie counting to work, you will need to eat the number of calories that are right for you in a day to lose a healthy amount of weight per week. A healthy amount of weight to lose per week is usually 1-2 lb (0.5-0.9 kg). A dietitian can determine how many calories you need in a day and give you suggestions on how to reach your  calorie goal.    WHAT DO I NEED TO KNOW ABOUT CALORIE COUNTING? In order to meet your daily calorie goal, you will need to:  Find out how many calories are in each food you would like to eat. Try to do this before you eat.  Decide how much of the food you can eat.  Write down what you ate and how many calories it had. Doing this is called keeping a food log. WHERE DO I FIND CALORIE INFORMATION? The number of calories in a food can be found on a Nutrition Facts label. Note that all the information on a label is based on a specific serving of the food. If a food does not have a Nutrition Facts label, try to look up the calories online or ask your dietitian for help. HOW DO I DECIDE HOW MUCH TO EAT? To decide how much of the food you can eat, you will need to consider both the number of calories in one serving and the size of one serving. This information can be found on the Nutrition Facts label. If a food  does not have a Nutrition Facts label, look up the information online or ask your dietitian for help. Remember that calories are listed per serving. If you choose to have more than one serving of a food, you will have to multiply the calories per serving by the amount of servings you plan to eat. For example, the label on a package of bread might say that a serving size is 1 slice and that there are 90 calories in a serving. If you eat 1 slice, you will have eaten 90 calories. If you eat 2 slices, you will have eaten 180 calories. HOW DO I KEEP A FOOD LOG? After each meal, record the following information in your food log:  What you ate.  How much of it you ate.  How many calories it had.  Then, add up your calories. Keep your food log near you, such as in a small notebook in your pocket. Another option is to use a mobile app or website. Some programs will calculate calories for you and show you how many calories you have left each time you add an item to the log. WHAT ARE SOME CALORIE COUNTING TIPS?  Use your calories on foods and drinks that will fill you up and not leave you hungry. Some examples of this include foods like nuts and nut butters, vegetables, lean proteins, and high-fiber foods (more than 5 g fiber per serving).  Eat nutritious foods and avoid empty calories. Empty calories are calories you get from foods or beverages that do not have many nutrients, such as candy and soda. It is better to have a nutritious high-calorie food (such as an avocado) than a food with few nutrients (such as a bag of chips).  Know how many calories are in the foods you eat most often. This way, you do not have to look up how many calories they have each time you eat them.  Look out for foods that may seem like low-calorie foods but are really high-calorie foods, such as baked goods, soda, and fat-free candy.  Pay attention to calories in drinks. Drinks such as sodas, specialty coffee drinks, alcohol,  and juices have a lot of calories yet do not fill you up. Choose low-calorie drinks like water and diet drinks.  Focus your calorie counting efforts on higher calorie items. Logging the calories in a garden salad that contains only  vegetables is less important than calculating the calories in a milk shake.  Find a way of tracking calories that works for you. Get creative. Most people who are successful find ways to keep track of how much they eat in a day, even if they do not count every calorie. WHAT ARE SOME PORTION CONTROL TIPS?  Know how many calories are in a serving. This will help you know how many servings of a certain food you can have.  Use a measuring cup to measure serving sizes. This is helpful when you start out. With time, you will be able to estimate serving sizes for some foods.  Take some time to put servings of different foods on your favorite plates, bowls, and cups so you know what a serving looks like.  Try not to eat straight from a bag or box. Doing this can lead to overeating. Put the amount you would like to eat in a cup or on a plate to make sure you are eating the right portion.  Use smaller plates, glasses, and bowls to prevent overeating. This is a quick and easy way to practice portion control. If your plate is smaller, less food can fit on it.  Try not to multitask while eating, such as watching TV or using your computer. If it is time to eat, sit down at a table and enjoy your food. Doing this will help you to start recognizing when you are full. It will also make you more aware of what and how much you are eating. HOW CAN I CALORIE COUNT WHEN EATING OUT?  Ask for smaller portion sizes or child-sized portions.  Consider sharing an entree and sides instead of getting your own entree.  If you get your own entree, eat only half. Ask for a box at the beginning of your meal and put the rest of your entree in it so you are not tempted to eat it.  Look for the  calories on the menu. If calories are listed, choose the lower calorie options.  Choose dishes that include vegetables, fruits, whole grains, low-fat dairy products, and lean protein. Focusing on smart food choices from each of the 5 food groups can help you stay on track at restaurants.  Choose items that are boiled, broiled, grilled, or steamed.  Choose water, milk, unsweetened iced tea, or other drinks without added sugars. If you want an alcoholic beverage, choose a lower calorie option. For example, a regular margarita can have up to 700 calories and a glass of wine has around 150.  Stay away from items that are buttered, battered, fried, or served with cream sauce. Items labeled "crispy" are usually fried, unless stated otherwise.  Ask for dressings, sauces, and syrups on the side. These are usually very high in calories, so do not eat much of them.  Watch out for salads. Many people think salads are a healthy option, but this is often not the case. Many salads come with bacon, fried chicken, lots of cheese, fried chips, and dressing. All of these items have a lot of calories. If you want a salad, choose a garden salad and ask for grilled meats or steak. Ask for the dressing on the side, or ask for olive oil and vinegar or lemon to use as dressing.  Estimate how many servings of a food you are given. For example, a serving of cooked rice is  cup or about the size of half a tennis ball or one cupcake wrapper. Knowing  serving sizes will help you be aware of how much food you are eating at restaurants. The list below tells you how big or small some common portion sizes are based on everyday objects.  1 oz--4 stacked dice.  3 oz--1 deck of cards.  1 tsp--1 dice.  1 Tbsp-- a Ping-Pong ball.  2 Tbsp--1 Ping-Pong ball.   cup--1 tennis ball or 1 cupcake wrapper.  1 cup--1 baseball. Document Released: 02/17/2005 Document Revised: 07/04/2013 Document Reviewed: 12/23/2012 [facility]  Patient Information 2015 [place], [place]. This information is not intended to replace advice given to you by your health care provider. Make sure you discuss any questions you have with your health care provider.

## 2014-11-15 NOTE — Progress Notes (Signed)
Assisted MD with pelvic exam

## 2014-11-15 NOTE — Progress Notes (Signed)
Gynecologic Oncology Consult Visit   Referring Provider: Dr. Ouida Sills  Chief Concern: Continued surveillance for endometrial cancer  Subjective:  Christine Lawson is a 54 y.o. female who is seen for continued surveillance for endometrial cancer.   She presents today with constant complaints of abdominal pain, bloating and distention. She is also noted decreased appetite and diarrhea. She is seen by Dr. Tiffany Kocher in gastroenterology and has been diagnosed with possible IBS per her and her sister's report. She is on Imodium and probiotics with minimal improvement. She also complains of chronic leg swelling.  She continues to alternate surveillance with Dr. Ouida Sills at her last visit with him was unremarkable.  Gynecologic Oncology History \ Christine Lawson is a pleasant female who is seen in consultation from Dr. 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  Problem List: Patient Active Problem List   Diagnosis Date Noted  . Cancer 09/05/2014  . Long term current use of insulin 12/12/2013  . Microalbuminuria 12/12/2013  . Diabetes 07/28/2013  . BP (high blood pressure) 07/28/2013  . HLD (hyperlipidemia) 07/28/2013  . Adiposity 07/28/2013  . Obstructive apnea 07/28/2013  . Arthritis, degenerative 07/28/2013  . Apnea, sleep 07/28/2013  . Thyroid nodule 07/28/2013    Past Medical History: Past Medical History  Diagnosis Date  . Endometrial adenocarcinoma   . GERD (gastroesophageal reflux disease)   . Hypertension   . Diabetes mellitus without complication   . Obesity   . Irritable bowel syndrome   . Hematochezia   . IDA (iron deficiency anemia)   . Hyperlipidemia   . Osteoarthritis     Past Surgical History: Past Surgical History  Procedure Laterality Date  . Abdominal hysterectomy  feb 2013    Past Gynecologic History:  See gynecologic oncology  istory     OB History:  OB History  No data available    Family History: History reviewed. No pertinent family history.  Social History: Social History   Social History  . Marital Status: Single    Spouse Name: N/A  . Number of Children: N/A  . Years of Education: N/A   Occupational History  . Not on file.   Social History Main Topics  . Smoking status: Never Smoker   . Smokeless tobacco: Never Used  . Alcohol Use: No  . Drug Use: No  . Sexual Activity: No   Other Topics Concern  . Not on file   Social History Narrative    Allergies: Allergies  Allergen Reactions  . Contrast Media [Iodinated Diagnostic Agents]   . Isovue [Iopamidol] Other (See Comments)    Wheezing     Current Medications: Current Outpatient Prescriptions  Medication Sig Dispense Refill  . amLODipine (NORVASC) 10 MG tablet Take 10 mg by mouth daily.    . cholecalciferol (VITAMIN D) 1000 UNITS tablet Take 1,000 Units by mouth daily.    . enalapril (VASOTEC) 5 MG tablet Take 5 mg by mouth daily.    . ferrous sulfate 325 (65 FE) MG tablet Take 325 mg by mouth daily after breakfast.    . insulin aspart (NOVOLOG) 100 UNIT/ML injection Inject 20 Units into the skin 3 (three) times daily before meals. 2o units in am, 26 units at lunch and 36 units at supper    . Insulin Glargine (LANTUS SOLOSTAR) 100 UNIT/ML Solostar Pen  Inject 46 Units as directed at bedtime.    . metFORMIN (GLUCOPHAGE-XR) 500 MG 24 hr tablet Take 1 tablet by mouth 2 (two) times daily.    Marland Kitchen omeprazole (PRILOSEC) 40 MG capsule Take 40 mg by mouth 2 (two) times daily.    . Probiotic Product (SOLUBLE FIBER/PROBIOTICS PO) Take 1 Dose by mouth 3 (three) times daily.    . vitamin B-12 (CYANOCOBALAMIN) 500 MCG tablet Take 1 tablet by mouth daily.     No current facility-administered medications for this visit.    Review of Systems General: no complaints  HEENT: no complaints  Lungs: no complaints  Cardiac: no complaints  GI:  pain, diarrhea, and bloating  GU: no complaints  Musculoskeletal: no complaints  Extremities: Leg swelling  Skin: no complaints  Neuro: no complaints  Endocrine: no complaints  Psych: no complaints       Objective:  Physical Examination:  BP 134/79 mmHg  Pulse 92  Temp(Src) 97.1 F (36.2 C) (Tympanic)  Resp 18  Ht 5\' 3"  (1.6 m)  Wt 253 lb 8.5 oz (115 kg)  BMI 44.92 kg/m2   ECOG Performance Status: 1 - Symptomatic but completely ambulatory  General appearance: alert, cooperative and appears stated age HEENT:PERRLA, extra ocular movement intact and sclera clear, anicteric Lymph node survey: non-palpable, axillary, inguinal, supraclavicular Cardiovascular: regular rate and rhythm Respiratory: decreased breath sounds, lungs clear to auscultation Abdomen: obese, firm, protuberant, distended, nontender, hernia at the umbilical site, and well healed incision Extremities: edema  chronic per patient Neurological exam reveals alert, oriented, normal speech, no focal findings or movement disorder noted.  Pelvic: exam chaperoned by nurse;  Vulva: normal appearing vulva with no masses, tenderness or lesions; Vagina: normal vagina; Adnexa: surgically absent bilateral; Uterus: surgically absent, vaginal cuff well healed; Cervix: absent; Rectal: not performed   Lab Review Labs on site today: Sed rate and CRP ordered  Radiologic Imaging: CT scan abdomen and pelvis ordered    Assessment:  Christine Lawson is a 54 y.o. female diagnosed with endometroid adenocarcinoma, grade 1-2, stage Ia endometrial cancer, NED. Medical co-morbidities complicating care: HTN, diabetes and obesity.  Plan:   Problem List Items Addressed This Visit    None    Visit Diagnoses    Endometrial cancer    -  Primary    Relevant Medications    vitamin B-12 (CYANOCOBALAMIN) 500 MCG tablet    Other Relevant Orders    Sedimentation rate    C-reactive protein    Creatinine, serum    CT Abdomen Pelvis W  Contrast      Given her symptoms as well as her physical exam I discussed her care with Dr. Vira Agar. He recommended a CT scan if she has not had one recently as well as a sedimentation rate and C-reactive protein. It's been at least 2 years per her report since she has had a CT scan. She also reports that she has an allergic reaction to IV contrast and was rushed to the emergency room. Therefore I a ordered a noncontrast CT scan of abdomen and pelvis.  If her scan is negative we will continue to alternate visits with Dr. Ouida Sills every 6 months. She will RTC in 12 months or sooner to see me of there are any new symptoms.   We also discussed her weight and need for weight loss, stressed good nutrition, and exercise. I spoke to her and her sister regarding the risks of morbid obesity including multiple medical issues that they already have  as well as cancers and risk of increased mortality.   Gillis Ends, MD    CC:  Dr. Ouida Sills  Dr. Vira Agar

## 2014-11-16 ENCOUNTER — Telehealth: Payer: Self-pay | Admitting: *Deleted

## 2014-11-16 LAB — C-REACTIVE PROTEIN: CRP: 1.6 mg/dL — ABNORMAL HIGH (ref <1.0)

## 2014-11-16 NOTE — Telephone Encounter (Signed)
Has questions regarding CT contrast and other questions as well. Please call her back [phone]

## 2014-11-16 NOTE — Telephone Encounter (Signed)
Returned telephone inquiry regarding contrast for CT scan. Patient verbalized understanding that she has to pick up contrast today.

## 2014-11-17 ENCOUNTER — Ambulatory Visit
Admission: RE | Admit: 2014-11-17 | Discharge: 2014-11-17 | Disposition: A | Discharge status: Home / Self Care | Payer: Medicare Other | Source: Ambulatory Visit | Attending: Obstetrics and Gynecology | Admitting: Obstetrics and Gynecology

## 2014-11-17 DIAGNOSIS — K449 Diaphragmatic hernia without obstruction or gangrene: Secondary | ICD-10-CM | POA: Insufficient documentation

## 2014-11-17 DIAGNOSIS — R590 Localized enlarged lymph nodes: Secondary | ICD-10-CM | POA: Insufficient documentation

## 2014-11-17 DIAGNOSIS — C541 Malignant neoplasm of endometrium: Secondary | ICD-10-CM

## 2014-11-17 DIAGNOSIS — R140 Abdominal distension (gaseous): Secondary | ICD-10-CM | POA: Diagnosis not present

## 2014-11-17 DIAGNOSIS — K429 Umbilical hernia without obstruction or gangrene: Secondary | ICD-10-CM | POA: Insufficient documentation

## 2014-11-22 ENCOUNTER — Telehealth: Payer: Self-pay | Admitting: *Deleted

## 2014-11-22 ENCOUNTER — Encounter: Payer: Self-pay | Admitting: *Deleted

## 2014-11-22 DIAGNOSIS — C801 Malignant (primary) neoplasm, unspecified: Secondary | ICD-10-CM

## 2014-11-22 NOTE — Telephone Encounter (Signed)
Asking for xray and lab results from last week, please call her back with these

## 2014-11-22 NOTE — Telephone Encounter (Signed)
Left message for patient to call back regarding CT/Lab results

## 2014-11-22 NOTE — Telephone Encounter (Signed)
Contacted patient for CT results and lab. Explained results to patient and caregiver. Informed patient of follow up CT in January.

## 2015-01-01 ENCOUNTER — Encounter: Payer: Medicare Other | Attending: Internal Medicine | Admitting: Dietician

## 2015-01-01 VITALS — Ht 63.0 in | Wt 254.6 lb

## 2015-01-01 DIAGNOSIS — E1129 Type 2 diabetes mellitus with other diabetic kidney complication: Secondary | ICD-10-CM

## 2015-01-01 DIAGNOSIS — Z794 Long term (current) use of insulin: Secondary | ICD-10-CM

## 2015-01-01 DIAGNOSIS — E119 Type 2 diabetes mellitus without complications: Secondary | ICD-10-CM | POA: Diagnosis present

## 2015-01-01 DIAGNOSIS — R809 Proteinuria, unspecified: Secondary | ICD-10-CM

## 2015-01-01 NOTE — Progress Notes (Signed)
Medical Nutrition Therapy: Visit start time: 1430  end time: 1530  Assessment:  Diagnosis: Diabetes Past medical history: obesity, sleep apnea Psychosocial issues/ stress concerns: patient reports moderate stress, engages in prayer and bible study for stress management Preferred learning method:  . No preference indicated  Current weight: 254.6lbs  Height: 5'3" Medications, supplements: reviewed list in MD notes with patient Progress and evaluation: Patient reports interest in losing weight.          She states she has made some changes since her last MNT visit, including using ground [place] instead of ground beef.   Physical activity: none, will be having knee surgery  Dietary Intake:  Usual eating pattern includes 3 meals and 2-3 snacks per day. Dining out frequency: unknown  Breakfast: today: applesauce and pkg peanut butter crackers Snack: pkg lemon cookies, sometimes Nekot cookies with peanut butter Lunch: today: cucumber and tomato sandwich + yogurt cup. Sometimes peanut butter with honey. Snack: apple, often more than one.  Supper: [place] burger or meat loaf ([place]) with pinto beans and green beans. Loves cabbage.  Snack: unkown Beverages: water, diet (or regular) peach tea, regular ginger ale when stomach is upset.   Nutrition Care Education: Topics covered: Diabetes, weight management Basic nutrition: appropriate nutrient balance, appropriate meal and snack schedule    Weight control: appropriate food portions and carbohydrate needs. Wrote [employer] based on patient's recent diet history.        Used simple 1-page food lists to review meal planning.  Diabetes:  appropriate meal and snack schedule, appropriate carb intake and balance   Nutritional Diagnosis:  [place]-3.3 Overweight/obesity As related to inactivity, history of excess caloric intake.  As evidenced by patient report, high BMI.  Intervention: Instruction as noted above.    Patient seems to be taking in some  large food portions, has difficulty with portion control.    Goals are to use menus provided to help with portion control.    She agrees to keep a food diary to return at follow-up for review.   Education Materials given:  . Food lists/1-page simple lists . Sample meal pattern/ menus  Learner/ who was taught:  . Patient  . Family member sister Lourene Hoston   Level of understanding: . Partial understanding; needs review/ practice  Demonstrated degree of understanding via:   Teach back Learning barriers: . None  Willingness to learn/ readiness for change: . Eager, change in progress   Monitoring and Evaluation:  Dietary intake, exercise, BG control, and body weight      follow up: 02/05/15

## 2015-02-05 ENCOUNTER — Ambulatory Visit: Payer: Medicare Other | Admitting: Dietician

## 2015-02-21 ENCOUNTER — Encounter: Payer: Medicare Other | Attending: Internal Medicine | Admitting: Dietician

## 2015-02-21 VITALS — Ht 63.0 in | Wt 254.8 lb

## 2015-02-21 DIAGNOSIS — E1129 Type 2 diabetes mellitus with other diabetic kidney complication: Secondary | ICD-10-CM

## 2015-02-21 DIAGNOSIS — E119 Type 2 diabetes mellitus without complications: Secondary | ICD-10-CM | POA: Insufficient documentation

## 2015-02-21 DIAGNOSIS — Z794 Long term (current) use of insulin: Secondary | ICD-10-CM

## 2015-02-21 DIAGNOSIS — R809 Proteinuria, unspecified: Secondary | ICD-10-CM

## 2015-02-21 NOTE — Progress Notes (Signed)
Medical Nutrition Therapy: Visit start time: 1330  end time: 1400  Assessment:  Diagnosis: Diabetes Medical history changes: no changes per patient Psychosocial issues/ stress concerns: none  Current weight: 254.8lbs  Height: 5'3" Medications, supplement changes: reviewed list in chart with patient  Progress and evaluation: Patient reports ongoing effort to improve diet; but states she overate on Thanksgiving and does eat cookies or veggie chips for snacks at times.          She also likes fruit and reports eating more than one serving at a time.          She occasionally drinks sugar-sweetened drinks.           She reports recent HbA1C of >8%, so knee surgery delayed for several months.   Physical activity: none due to knee pain; uses cane  Dietary Intake:  Usual eating pattern includes 3 meals and 2 snacks per day. Dining out frequency: unknown meals per week.  Breakfast: today oatmeal; often fruit/applesauce and pkg. peanut butter crackers.  Snack: sometimes none if stomach is upset Lunch: usually sandwich Snack: fruit, more than one serving Supper: last week had pintos and green beans. Usually meat and vegetables.  Snack: cookies or fruit or veggie chips Beverages: mostly water, occasionally regular ginger ale when stomach is upset, or peach tea.         Sparkling grape juice on holidays.  Nutrition Care Education: Topics covered: Diabetes Basic nutrition: basic food groups Weight control: behavior changes to control food portions; short-term indulgences vs. Long-term consequences Diabetes:  appropriate carb intake and balance, importance of controlling sugar and fat intake   Nutritional Diagnosis:  [place]-3.3 Overweight/obesity As related to excess caloric intake and low activity level.  As evidenced by patient report, high BMI.  Intervention: Discussion as noted above.   Set new goals with patient input.   No further visits scheduled at this time; patient to call if  needed.  Education Materials given:  . Food lists/ 1-page simple meal planner . Goals/ instructions   Learner/ who was taught:  . Patient  . Family member sister   Level of understanding: . Partial understanding; needs review/ practice  Demonstrated degree of understanding via:   Teach back Learning barriers: . None  Willingness to learn/ readiness for change: . Eager, change in progress  Monitoring and Evaluation:  Dietary intake, exercise, BG control, and body weight      follow up: prn

## 2015-02-21 NOTE — Patient Instructions (Addendum)
   Try part-skim mozzarella cheese (string cheese). It is low in fat and low in lactose.  Eat only 1 piece or serving of fruit at a time. More than one serving can make your sugar go up too high.   When you eat cookies or veggie chips put a small portion on your plate or bowl and put the rest away. If you eat from a big bag or box, you will always eat too much.

## 2015-03-16 ENCOUNTER — Inpatient Hospital Stay: Payer: Medicare Other

## 2015-03-23 ENCOUNTER — Inpatient Hospital Stay: Payer: Medicare Other | Attending: Internal Medicine

## 2015-03-23 DIAGNOSIS — C541 Malignant neoplasm of endometrium: Secondary | ICD-10-CM | POA: Insufficient documentation

## 2015-03-23 DIAGNOSIS — D509 Iron deficiency anemia, unspecified: Secondary | ICD-10-CM

## 2015-03-23 LAB — CBC WITH DIFFERENTIAL/PLATELET
BASOS ABS: 0.1 10*3/uL (ref 0–0.1)
Basophils Relative: 1 %
EOS ABS: 0.3 10*3/uL (ref 0–0.7)
EOS PCT: 3 %
HCT: 37.8 % (ref 35.0–47.0)
Hemoglobin: 12.3 g/dL (ref 12.0–16.0)
Lymphocytes Relative: 18 %
Lymphs Abs: 1.9 10*3/uL (ref 1.0–3.6)
MCH: 25.6 pg — AB (ref 26.0–34.0)
MCHC: 32.6 g/dL (ref 32.0–36.0)
MCV: 78.6 fL — ABNORMAL LOW (ref 80.0–100.0)
Monocytes Absolute: 0.7 10*3/uL (ref 0.2–0.9)
Monocytes Relative: 7 %
Neutro Abs: 7.5 10*3/uL — ABNORMAL HIGH (ref 1.4–6.5)
Neutrophils Relative %: 71 %
PLATELETS: 474 10*3/uL — AB (ref 150–440)
RBC: 4.8 MIL/uL (ref 3.80–5.20)
RDW: 14.5 % (ref 11.5–14.5)
WBC: 10.5 10*3/uL (ref 3.6–11.0)

## 2015-03-23 LAB — IRON AND TIBC
Iron: 43 ug/dL (ref 28–170)
SATURATION RATIOS: 13 % (ref 10.4–31.8)
TIBC: 337 ug/dL (ref 250–450)
UIBC: 294 ug/dL

## 2015-03-23 LAB — FERRITIN: FERRITIN: 145 ng/mL (ref 11–307)

## 2015-05-31 ENCOUNTER — Other Ambulatory Visit: Payer: Self-pay | Admitting: Internal Medicine

## 2015-05-31 DIAGNOSIS — Z1231 Encounter for screening mammogram for malignant neoplasm of breast: Secondary | ICD-10-CM

## 2015-06-12 ENCOUNTER — Ambulatory Visit: Payer: Medicare Other | Attending: Internal Medicine

## 2015-07-11 ENCOUNTER — Other Ambulatory Visit: Payer: Self-pay | Admitting: *Deleted

## 2015-07-11 DIAGNOSIS — C801 Malignant (primary) neoplasm, unspecified: Secondary | ICD-10-CM

## 2015-07-12 ENCOUNTER — Ambulatory Visit
Admission: RE | Admit: 2015-07-12 | Discharge: 2015-07-12 | Disposition: A | Discharge status: Home / Self Care | Payer: Medicare Other | Source: Ambulatory Visit | Attending: Obstetrics and Gynecology | Admitting: Obstetrics and Gynecology

## 2015-07-12 ENCOUNTER — Ambulatory Visit: Payer: Medicare Other

## 2015-07-12 ENCOUNTER — Telehealth: Payer: Self-pay | Admitting: *Deleted

## 2015-07-12 DIAGNOSIS — C801 Malignant (primary) neoplasm, unspecified: Secondary | ICD-10-CM | POA: Insufficient documentation

## 2015-07-12 NOTE — Telephone Encounter (Signed)
Dr. Theora Gianotti order this test. I will forward to her team to review these results with patient and f/u.

## 2015-07-12 NOTE — Telephone Encounter (Signed)
Asking for CT results  IMPRESSION: Persistent LEFT ovarian cyst 4.0 x 3.1 x 4.8 cm.  Post hysterectomy.  Slight interval increases in sizes of of enlarged BILATERAL inguinal lymph nodes, nonspecific, could be reactive but metastatic disease not excluded ; consider followup PET-CT assessment.

## 2015-07-12 NOTE — Telephone Encounter (Signed)
Patient called again and given number to [place] office

## 2015-07-13 ENCOUNTER — Telehealth: Payer: Self-pay

## 2015-07-13 DIAGNOSIS — R590 Localized enlarged lymph nodes: Secondary | ICD-10-CM

## 2015-07-13 DIAGNOSIS — C541 Malignant neoplasm of endometrium: Secondary | ICD-10-CM | POA: Insufficient documentation

## 2015-07-13 HISTORY — DX: Localized enlarged lymph nodes: R59.0

## 2015-07-13 NOTE — Telephone Encounter (Signed)
  Oncology Nurse Navigator Documentation  Navigator Location: [facility] (07/13/15 1500) Navigator Encounter Type: Diagnostic Results (07/13/15 1500)                                          Time Spent with Patient: 30 (07/13/15 1500)   Pt given results of recent abdominal CT. PET scan ordered per Dr Theora Gianotti. Needs to be seen back in [place] [facility] following

## 2015-07-13 NOTE — Progress Notes (Signed)
  Oncology Nurse Navigator Documentation  Navigator Location: [facility] (07/13/15 1200) Navigator Encounter Type: Diagnostic Results (07/13/15 1200)                                          Time Spent with Patient: 15 (07/13/15 1200)   Recent CT sent to Dr Theora Gianotti to review

## 2015-07-18 NOTE — Telephone Encounter (Signed)
Spoke to Dr.Secord; pt will be informed of the results.

## 2015-07-24 ENCOUNTER — Other Ambulatory Visit: Payer: Self-pay | Admitting: Internal Medicine

## 2015-07-24 ENCOUNTER — Ambulatory Visit
Admission: RE | Admit: 2015-07-24 | Discharge: 2015-07-24 | Disposition: A | Discharge status: Home / Self Care | Payer: Medicare Other | Source: Ambulatory Visit | Attending: Internal Medicine | Admitting: Internal Medicine

## 2015-07-24 DIAGNOSIS — Z1231 Encounter for screening mammogram for malignant neoplasm of breast: Secondary | ICD-10-CM | POA: Insufficient documentation

## 2015-07-24 DIAGNOSIS — C541 Malignant neoplasm of endometrium: Secondary | ICD-10-CM | POA: Diagnosis present

## 2015-07-24 DIAGNOSIS — R599 Enlarged lymph nodes, unspecified: Secondary | ICD-10-CM | POA: Diagnosis present

## 2015-07-24 DIAGNOSIS — K760 Fatty (change of) liver, not elsewhere classified: Secondary | ICD-10-CM | POA: Insufficient documentation

## 2015-07-25 ENCOUNTER — Ambulatory Visit
Admission: RE | Admit: 2015-07-25 | Discharge: 2015-07-25 | Disposition: A | Discharge status: Home / Self Care | Payer: Medicare Other | Source: Ambulatory Visit | Attending: Obstetrics and Gynecology | Admitting: Obstetrics and Gynecology

## 2015-07-25 DIAGNOSIS — C541 Malignant neoplasm of endometrium: Secondary | ICD-10-CM

## 2015-07-25 DIAGNOSIS — R590 Localized enlarged lymph nodes: Secondary | ICD-10-CM

## 2015-07-25 DIAGNOSIS — Z1231 Encounter for screening mammogram for malignant neoplasm of breast: Secondary | ICD-10-CM | POA: Diagnosis not present

## 2015-07-25 LAB — GLUCOSE, CAPILLARY
GLUCOSE-CAPILLARY: 165 mg/dL — AB (ref 65–99)
Glucose-Capillary: 159 mg/dL — ABNORMAL HIGH (ref 65–99)

## 2015-07-25 MED ORDER — FLUDEOXYGLUCOSE F - 18 (FDG) INJECTION
12.5100 | Freq: Once | INTRAVENOUS | Status: AC | PRN
  Administered 2015-07-25: 12.51 via INTRAVENOUS

## 2015-08-01 ENCOUNTER — Encounter: Payer: Self-pay | Admitting: Obstetrics and Gynecology

## 2015-08-01 ENCOUNTER — Ambulatory Visit: Payer: Medicare Other

## 2015-08-01 ENCOUNTER — Inpatient Hospital Stay: Payer: Medicare Other | Attending: Obstetrics and Gynecology | Admitting: Obstetrics and Gynecology

## 2015-08-01 ENCOUNTER — Encounter: Payer: Self-pay | Admitting: *Deleted

## 2015-08-01 ENCOUNTER — Other Ambulatory Visit: Payer: Self-pay | Admitting: Internal Medicine

## 2015-08-01 DIAGNOSIS — G4733 Obstructive sleep apnea (adult) (pediatric): Secondary | ICD-10-CM | POA: Diagnosis not present

## 2015-08-01 DIAGNOSIS — Z79899 Other long term (current) drug therapy: Secondary | ICD-10-CM

## 2015-08-01 DIAGNOSIS — Z8542 Personal history of malignant neoplasm of other parts of uterus: Secondary | ICD-10-CM | POA: Insufficient documentation

## 2015-08-01 DIAGNOSIS — I10 Essential (primary) hypertension: Secondary | ICD-10-CM | POA: Diagnosis not present

## 2015-08-01 DIAGNOSIS — E669 Obesity, unspecified: Secondary | ICD-10-CM | POA: Insufficient documentation

## 2015-08-01 DIAGNOSIS — K760 Fatty (change of) liver, not elsewhere classified: Secondary | ICD-10-CM | POA: Diagnosis not present

## 2015-08-01 DIAGNOSIS — E119 Type 2 diabetes mellitus without complications: Secondary | ICD-10-CM | POA: Insufficient documentation

## 2015-08-01 DIAGNOSIS — Z90710 Acquired absence of both cervix and uterus: Secondary | ICD-10-CM | POA: Diagnosis not present

## 2015-08-01 DIAGNOSIS — M1990 Unspecified osteoarthritis, unspecified site: Secondary | ICD-10-CM | POA: Diagnosis not present

## 2015-08-01 DIAGNOSIS — E785 Hyperlipidemia, unspecified: Secondary | ICD-10-CM

## 2015-08-01 DIAGNOSIS — K449 Diaphragmatic hernia without obstruction or gangrene: Secondary | ICD-10-CM | POA: Diagnosis not present

## 2015-08-01 DIAGNOSIS — R109 Unspecified abdominal pain: Secondary | ICD-10-CM | POA: Diagnosis not present

## 2015-08-01 DIAGNOSIS — K589 Irritable bowel syndrome without diarrhea: Secondary | ICD-10-CM | POA: Diagnosis not present

## 2015-08-01 DIAGNOSIS — N63 Unspecified lump in unspecified breast: Secondary | ICD-10-CM

## 2015-08-01 DIAGNOSIS — R197 Diarrhea, unspecified: Secondary | ICD-10-CM | POA: Insufficient documentation

## 2015-08-01 DIAGNOSIS — K219 Gastro-esophageal reflux disease without esophagitis: Secondary | ICD-10-CM | POA: Diagnosis not present

## 2015-08-01 DIAGNOSIS — Z90722 Acquired absence of ovaries, bilateral: Secondary | ICD-10-CM | POA: Diagnosis not present

## 2015-08-01 DIAGNOSIS — Z794 Long term (current) use of insulin: Secondary | ICD-10-CM | POA: Diagnosis not present

## 2015-08-01 DIAGNOSIS — Z7984 Long term (current) use of oral hypoglycemic drugs: Secondary | ICD-10-CM | POA: Diagnosis not present

## 2015-08-01 DIAGNOSIS — R591 Generalized enlarged lymph nodes: Secondary | ICD-10-CM | POA: Insufficient documentation

## 2015-08-01 DIAGNOSIS — R599 Enlarged lymph nodes, unspecified: Secondary | ICD-10-CM

## 2015-08-01 NOTE — Patient Instructions (Signed)
CT 07/12/2015    ADDENDUM REPORT: 07/13/2015 14:57 ADDENDUM: Revision to body and impression. Additional history: Patient is reportedly post BILATERAL oophorectomy. The low-attenuation suspected cystic structures in the pelvis bilaterally, LEFT 4.0 x 3.1 x 4.8 cm and RIGHT 3.4 x 1.8 x 2.9 cm, therefore do not represent ovarian cysts. Differential diagnosis then would include chronic seromas, postoperative lymphoceles, and cystic/necrotic lymph nodes. These could be characterized by MR imaging. However, in light of BILATERAL inguinal adenopathy also identified, these could be evaluated concurrently by PET-CT imaging at the time of assessment of the inguinal adenopathy. Electronically Signed  By: Lavonia Dana M.D.  On: 07/13/2015 14:57     Study Result     CLINICAL DATA: Endometrial cancer 3 years ago post hysterectomy, asymptomatic, hypertension, diabetes mellitus, followup  EXAM: CT ABDOMEN AND PELVIS WITHOUT CONTRAST  TECHNIQUE: Multidetector CT imaging of the abdomen and pelvis was performed following the standard protocol without IV contrast. Sagittal and coronal MPR images reconstructed from axial data set. Patient drank dilute oral contrast for exam.  COMPARISON: 11/17/2014  FINDINGS: Lower chest: Minimal subsegmental atelectasis LEFT lower lobe.  Hepatobiliary: Liver and gallbladder grossly unremarkable for noncontrast technique. No biliary dilatation.  Pancreas: Atrophic, otherwise unremarkable  Spleen: Normal appearance  Adrenals/Urinary Tract: Normal adrenal glands. No renal mass or hydronephrosis. No urinary tract calcification or dilatation. Bladder and ureters unremarkable.  Stomach/Bowel: Appendix not visualized. Probable tiny hiatal hernia. Stomach and bowel loops otherwise normal appearance.  Vascular/Lymphatic: BILATERAL inguinal adenopathy, largest RIGHT inguinal node 21 mm short axis previously 16 mm and largest LEFT inguinal  node 19 mm short axis previously 17 mm. Normal sized RIGHT external iliac node. No additional adenopathy or vascular abnormalities.  Reproductive: Uterus surgically absent. Normal sized RIGHT ovary. Enlargement of LEFT ovary by a low-attenuation structure question cyst 4.0 x 3.1 x 4.8 cm unchanged.  Other: No additional mass, free air or free fluid. No hernia.  Musculoskeletal: No acute osseous findings.  IMPRESSION: Persistent LEFT ovarian cyst 4.0 x 3.1 x 4.8 cm.  Post hysterectomy.  Slight interval increases in sizes of of enlarged BILATERAL inguinal lymph nodes, nonspecific, could be reactive but metastatic disease not excluded ; consider followup PET-CT assessment.  Electronically Signed: By: Lavonia Dana M.D. On: 07/12/2015 09:20     PET/CT 07/25/2015 CLINICAL DATA: Initial treatment strategy for endometrial carcinoma with inguinal adenopathy.  EXAM: NUCLEAR MEDICINE PET SKULL BASE TO THIGH  TECHNIQUE: 12.5 mCi F-18 FDG was injected intravenously. Full-ring PET imaging was performed from the skull base to thigh after the radiotracer. CT data was obtained and used for attenuation correction and anatomic localization.  FASTING BLOOD GLUCOSE: Value: 165 mg/dl  COMPARISON: CT abdomen pelvis 07/12/2015 and CT chest 06/03/2013.  FINDINGS: NECK  No hypermetabolic lymph nodes in the neck. CT images show no acute findings.  CHEST  No hypermetabolic mediastinal, hilar or axillary lymph nodes. No hypermetabolic pulmonary nodules. Heart is enlarged. No pericardial or pleural effusion.  ABDOMEN/PELVIS  Inguinal lymph nodes measure up to 1.7 cm on the right (CT image 233) with an SUV max of 2.9. Lymph nodes are stable in size from 11/17/2014. No abnormal hypermetabolism in the liver, adrenal glands, spleen or pancreas. Liver is decreased in attenuation diffusely. Gallbladder, adrenal glands, kidneys, spleen, pancreas, stomach and bowel are  unremarkable with exception of a tiny hiatal hernia. Hysterectomy and bilateral salpingo-oophorectomy. Low-attenuation lesions in the adnexal regions measure up to 4.2 cm on the left and show no abnormal hypermetabolism. Postoperative changes along the midline  ventral abdominal pelvic wall with scattered fat herniation.  SKELETON  No abnormal osseous hypermetabolism.  IMPRESSION: 1. Borderline hypermetabolic inguinal lymph nodes are unchanged in size from 11/17/2014, arguing against metastatic disease. 2. Low-attenuation lesions in the adnexal regions are not hypermetabolic, favoring postoperative seromas. 3. Hepatic steatosis.   Electronically Signed  By: Lorin Picket M.D.  On: 07/25/2015 14:02

## 2015-08-01 NOTE — Progress Notes (Signed)
Gynecologic Oncology Consult Visit   Referring Provider: Dr. Ouida Sills  Chief Concern: Continued surveillance for endometrial cancer  Subjective:  Christine Lawson is a 55 y.o. female who is seen for continued surveillance for endometrial cancer.   She presents today to review her radiologic results. At her last visit she was complaining of constant complaints of abdominal pain, bloating and distention. She is also noted decreased appetite and diarrhea. She is seen by Dr. Tiffany Kocher in gastroenterology and has been diagnosed with possible IBS per her and her sister's report. I spoke with him about her symptoms and he recommended a sedimentation rate and C-reactive protein.   CRP was 1.6 (H) and Sed rate 51 (H)   We ordered a CT scan on 11/17/2014 IMPRESSION: 1. Nonobstructive gas pattern with no findings to account for patient's symptoms.  2. Bilateral inguinal adenopathy,most prominent in the upper right inguinal/inferior right pelvic region. All nodes are similar except for one new node on the right measuring 27 x 34mm. Metastatic disease not excluded.  3. Stable fluid collection left pelvis likely a postoperative fluid Collection.  She was supposed to have a repeat scan in January but she missed that appointment.   She continues to alternate surveillance with Dr. Ouida Sills at her last visit with him was unremarkable March 2017. He contacted [place] to let us know she missed her CT scan. We ordered the repeat study as noted below.   CT 07/12/2015    ADDENDUM REPORT: 07/13/2015 14:57 ADDENDUM: Revision to body and impression. Additional history: Patient is reportedly post BILATERAL oophorectomy. The low-attenuation suspected cystic structures in the pelvis bilaterally, LEFT 4.0 x 3.1 x 4.8 cm and RIGHT 3.4 x 1.8 x 2.9 cm, therefore do not represent ovarian cysts. Differential diagnosis then would include chronic seromas, postoperative lymphoceles, and cystic/necrotic lymph  nodes. These could be characterized by MR imaging. However, in light of BILATERAL inguinal adenopathy also identified, these could be evaluated concurrently by PET-CT imaging at the time of assessment of the inguinal adenopathy. Electronically Signed  By: Lavonia Dana M.D.  On: 07/13/2015 14:57     Study Result     CLINICAL DATA: Endometrial cancer 3 years ago post hysterectomy, asymptomatic, hypertension, diabetes mellitus, followup  EXAM: CT ABDOMEN AND PELVIS WITHOUT CONTRAST  TECHNIQUE: Multidetector CT imaging of the abdomen and pelvis was performed following the standard protocol without IV contrast. Sagittal and coronal MPR images reconstructed from axial data set. Patient drank dilute oral contrast for exam.  COMPARISON: 11/17/2014  FINDINGS: Lower chest: Minimal subsegmental atelectasis LEFT lower lobe.  Hepatobiliary: Liver and gallbladder grossly unremarkable for noncontrast technique. No biliary dilatation.  Pancreas: Atrophic, otherwise unremarkable  Spleen: Normal appearance  Adrenals/Urinary Tract: Normal adrenal glands. No renal mass or hydronephrosis. No urinary tract calcification or dilatation. Bladder and ureters unremarkable.  Stomach/Bowel: Appendix not visualized. Probable tiny hiatal hernia. Stomach and bowel loops otherwise normal appearance.  Vascular/Lymphatic: BILATERAL inguinal adenopathy, largest RIGHT inguinal node 21 mm short axis previously 16 mm and largest LEFT inguinal node 19 mm short axis previously 17 mm. Normal sized RIGHT external iliac node. No additional adenopathy or vascular abnormalities.  Reproductive: Uterus surgically absent. Normal sized RIGHT ovary. Enlargement of LEFT ovary by a low-attenuation structure question cyst 4.0 x 3.1 x 4.8 cm unchanged.  Other: No additional mass, free air or free fluid. No hernia.  Musculoskeletal: No acute osseous findings.  IMPRESSION: Persistent LEFT  ovarian cyst 4.0 x 3.1 x 4.8 cm.  Post hysterectomy.  Slight interval increases  in sizes of of enlarged BILATERAL inguinal lymph nodes, nonspecific, could be reactive but metastatic disease not excluded ; consider followup PET-CT assessment.  Electronically Signed: By: Lavonia Dana M.D. On: 07/12/2015 09:20     PET/CT 07/25/2015 CLINICAL DATA: Initial treatment strategy for endometrial carcinoma with inguinal adenopathy.  EXAM: NUCLEAR MEDICINE PET SKULL BASE TO THIGH  TECHNIQUE: 12.5 mCi F-18 FDG was injected intravenously. Full-ring PET imaging was performed from the skull base to thigh after the radiotracer. CT data was obtained and used for attenuation correction and anatomic localization.  FASTING BLOOD GLUCOSE: Value: 165 mg/dl  COMPARISON: CT abdomen pelvis 07/12/2015 and CT chest 06/03/2013.  FINDINGS: NECK  No hypermetabolic lymph nodes in the neck. CT images show no acute findings.  CHEST  No hypermetabolic mediastinal, hilar or axillary lymph nodes. No hypermetabolic pulmonary nodules. Heart is enlarged. No pericardial or pleural effusion.  ABDOMEN/PELVIS  Inguinal lymph nodes measure up to 1.7 cm on the right (CT image 233) with an SUV max of 2.9. Lymph nodes are stable in size from 11/17/2014. No abnormal hypermetabolism in the liver, adrenal glands, spleen or pancreas. Liver is decreased in attenuation diffusely. Gallbladder, adrenal glands, kidneys, spleen, pancreas, stomach and bowel are unremarkable with exception of a tiny hiatal hernia. Hysterectomy and bilateral salpingo-oophorectomy. Low-attenuation lesions in the adnexal regions measure up to 4.2 cm on the left and show no abnormal hypermetabolism. Postoperative changes along the midline ventral abdominal pelvic wall with scattered fat herniation.  SKELETON  No abnormal osseous hypermetabolism.  IMPRESSION: 1. Borderline hypermetabolic inguinal lymph nodes are  unchanged in size from 11/17/2014, arguing against metastatic disease. 2. Low-attenuation lesions in the adnexal regions are not hypermetabolic, favoring postoperative seromas. 3. Hepatic steatosis.   Today she presents with complaints of cough, abdominal pain, diarrhea, and leg swelling. She is supposed to have a colonoscopy tomorrow to evaluate GI symtpoms.    Gynecologic Oncology History  Lalanya Seyfried is a pleasant female who is seen in consultation from Dr. Ouida Sills 04/2011     endometroid adenocarcinoma, grade 1-2, stage Ia (invasion 4 of 70mm, no LVSI),                TAHBSO and staging NED since surgery.  Problem List: Patient Active Problem List   Diagnosis Date Noted  . History of endometrial cancer 08/01/2015  . Endometrial adenocarcinoma ([place]) 07/13/2015    Class: History of  . Inguinal lymphadenopathy 07/13/2015  . Cancer ([place]) 09/05/2014  . Long term current use of insulin ([place]) 12/12/2013  . Microalbuminuria 12/12/2013  . Diabetes ([place]) 07/28/2013  . BP (high blood pressure) 07/28/2013  . HLD (hyperlipidemia) 07/28/2013  . Adiposity 07/28/2013  . Obstructive apnea 07/28/2013  . Arthritis, degenerative 07/28/2013  . Apnea, sleep 07/28/2013  . Thyroid nodule 07/28/2013    Past Medical History: Past Medical History  Diagnosis Date  . Endometrial adenocarcinoma ([place])   . GERD (gastroesophageal reflux disease)   . Hypertension   . Diabetes mellitus without complication ([place])   . Obesity   . Irritable bowel syndrome   . Hematochezia   . IDA (iron deficiency anemia)   . Hyperlipidemia   . Osteoarthritis   . Inguinal lymphadenopathy 07/13/2015    Past Surgical History: Past Surgical History  Procedure Laterality Date  . Abdominal hysterectomy  feb 2013    Past Gynecologic History:  See gynecologic oncology history     OB History:  OB History  No data available    Family History: History reviewed. No  pertinent family  history.  Social History: Social History   Social History  . Marital Status: Single    Spouse Name: N/A  . Number of Children: N/A  . Years of Education: N/A   Occupational History  . Not on file.   Social History Main Topics  . Smoking status: Never Smoker   . Smokeless tobacco: Never Used  . Alcohol Use: No  . Drug Use: No  . Sexual Activity: No   Other Topics Concern  . Not on file   Social History Narrative    Allergies: Allergies  Allergen Reactions  . Contrast Media [Iodinated Diagnostic Agents]   . Isovue [Iopamidol] Other (See Comments)    Wheezing     Current Medications: Current Outpatient Prescriptions  Medication Sig Dispense Refill  . amLODipine (NORVASC) 10 MG tablet Take 10 mg by mouth daily.    . cholecalciferol (VITAMIN D) 1000 UNITS tablet Take 1,000 Units by mouth daily.    . ferrous sulfate 325 (65 FE) MG tablet Take 325 mg by mouth daily after breakfast.    . insulin aspart (NOVOLOG) 100 UNIT/ML injection Inject 20 Units into the skin 3 (three) times daily before meals. 2o units in am, 26 units at lunch and 36 units at supper    . Insulin Glargine (LANTUS SOLOSTAR) 100 UNIT/ML Solostar Pen Inject 46 Units as directed at bedtime.    . metFORMIN (GLUCOPHAGE-XR) 500 MG 24 hr tablet Take 1 tablet by mouth 2 (two) times daily.    Marland Kitchen omeprazole (PRILOSEC) 40 MG capsule Take 40 mg by mouth 2 (two) times daily.    . Probiotic Product (SOLUBLE FIBER/PROBIOTICS PO) Take 1 Dose by mouth 3 (three) times daily.    . vitamin B-12 (CYANOCOBALAMIN) 500 MCG tablet Take 1 tablet by mouth daily.    . enalapril (VASOTEC) 5 MG tablet Take 5 mg by mouth daily.     No current facility-administered medications for this visit.    Review of Systems General: no complaints  HEENT: no complaints  Lungs: no complaints  Cardiac: no complaints  GI: pain, diarrhea, and bloating  GU: no complaints  Musculoskeletal: no complaints  Extremities: Leg swelling  Skin: no  complaints  Neuro: no complaints  Endocrine: no complaints  Psych: no complaints       Objective:  Physical Examination:  There were no vitals taken for this visit.   ECOG Performance Status: 1 - Symptomatic but completely ambulatory  General appearance: alert, cooperative and appears stated age HEENT:PERRLA, extra ocular movement intact and sclera clear, anicteric Lymph node survey: non-palpable, axillary, inguinal, supraclavicular Cardiovascular: regular rate and rhythm Respiratory: decreased breath sounds, lungs clear to auscultation Abdomen: obese, firm, protuberant, distended, nontender, hernia at the umbilical site, and well healed incision Extremities: edema  chronic per patient Neurological exam reveals alert, oriented, normal speech, no focal findings or movement disorder noted. She walks with a cane.   Pelvic: Deferred to 11/2015. Last exam 11/2014 exam chaperoned by nurse;  Vulva: normal appearing vulva with no masses, tenderness or lesions; Vagina: normal vagina; Adnexa: surgically absent bilateral; Uterus: surgically absent, vaginal cuff well healed; Cervix: absent; Rectal: not performed   Lab Review Labs on site today: n/a  Radiologic Imaging: n/a    Assessment:  Mehreen Guaman is a 55 y.o. female diagnosed with endometroid adenocarcinoma, grade 1-2, stage Ia endometrial cancer, NED. Inguinal adenopathy, borderline. Probable bilateral lymphocysts, asymptomatic. GI symptoms and abdominal pain, uncertain etiology.  Medical co-morbidities complicating care: HTN, diabetes and  obesity.  Plan:   Problem List Items Addressed This Visit      Other   History of endometrial cancer - Primary    Other Visit Diagnoses    Adenopathy          PET/CT scans were reassuring.   She also reports that she has an allergic reaction to IV contrast and was rushed to the emergency room. Needs noncontrast studies.   We will continue to alternate visits with Dr. Ouida Sills  every 6 months. She will RTC in 11/2015 or sooner if there are any new symptoms.   She will follow up with GI for her colonoscopy.   I have spoken to her and her sister previously regarding the risks of morbid obesity including multiple medical issues that they already have as well as cancers and risk of increased mortality.   Gillis Ends, MD    CC:  Dr. Ouida Sills  Dr. Vira Agar

## 2015-08-01 NOTE — Progress Notes (Signed)
Patient here for follow up and test results.

## 2015-08-02 ENCOUNTER — Ambulatory Visit: Payer: Medicare Other | Admitting: Anesthesiology

## 2015-08-02 ENCOUNTER — Encounter
Admission: RE | Disposition: A | Discharge status: Home / Self Care | Payer: Self-pay | Source: Ambulatory Visit | Attending: Unknown Physician Specialty

## 2015-08-02 ENCOUNTER — Ambulatory Visit
Admission: RE | Admit: 2015-08-02 | Discharge: 2015-08-02 | Disposition: A | Discharge status: Home / Self Care | Payer: Medicare Other | Source: Ambulatory Visit | Attending: Unknown Physician Specialty | Admitting: Unknown Physician Specialty

## 2015-08-02 DIAGNOSIS — K2950 Unspecified chronic gastritis without bleeding: Secondary | ICD-10-CM | POA: Insufficient documentation

## 2015-08-02 DIAGNOSIS — E669 Obesity, unspecified: Secondary | ICD-10-CM | POA: Diagnosis not present

## 2015-08-02 DIAGNOSIS — R1084 Generalized abdominal pain: Secondary | ICD-10-CM | POA: Insufficient documentation

## 2015-08-02 DIAGNOSIS — Z8542 Personal history of malignant neoplasm of other parts of uterus: Secondary | ICD-10-CM | POA: Diagnosis not present

## 2015-08-02 DIAGNOSIS — K633 Ulcer of intestine: Secondary | ICD-10-CM | POA: Insufficient documentation

## 2015-08-02 DIAGNOSIS — D509 Iron deficiency anemia, unspecified: Secondary | ICD-10-CM | POA: Diagnosis not present

## 2015-08-02 DIAGNOSIS — Z7984 Long term (current) use of oral hypoglycemic drugs: Secondary | ICD-10-CM | POA: Diagnosis not present

## 2015-08-02 DIAGNOSIS — K589 Irritable bowel syndrome without diarrhea: Secondary | ICD-10-CM | POA: Insufficient documentation

## 2015-08-02 DIAGNOSIS — K210 Gastro-esophageal reflux disease with esophagitis: Secondary | ICD-10-CM | POA: Insufficient documentation

## 2015-08-02 DIAGNOSIS — I10 Essential (primary) hypertension: Secondary | ICD-10-CM | POA: Insufficient documentation

## 2015-08-02 DIAGNOSIS — Z79899 Other long term (current) drug therapy: Secondary | ICD-10-CM | POA: Insufficient documentation

## 2015-08-02 DIAGNOSIS — Z794 Long term (current) use of insulin: Secondary | ICD-10-CM | POA: Insufficient documentation

## 2015-08-02 DIAGNOSIS — G4730 Sleep apnea, unspecified: Secondary | ICD-10-CM | POA: Diagnosis not present

## 2015-08-02 DIAGNOSIS — R1013 Epigastric pain: Secondary | ICD-10-CM | POA: Diagnosis not present

## 2015-08-02 DIAGNOSIS — E119 Type 2 diabetes mellitus without complications: Secondary | ICD-10-CM | POA: Diagnosis not present

## 2015-08-02 DIAGNOSIS — Z7982 Long term (current) use of aspirin: Secondary | ICD-10-CM | POA: Insufficient documentation

## 2015-08-02 DIAGNOSIS — M1990 Unspecified osteoarthritis, unspecified site: Secondary | ICD-10-CM | POA: Diagnosis not present

## 2015-08-02 DIAGNOSIS — E785 Hyperlipidemia, unspecified: Secondary | ICD-10-CM | POA: Diagnosis not present

## 2015-08-02 DIAGNOSIS — K640 First degree hemorrhoids: Secondary | ICD-10-CM | POA: Diagnosis not present

## 2015-08-02 HISTORY — PX: COLONOSCOPY WITH PROPOFOL: SHX5780

## 2015-08-02 HISTORY — DX: Sleep apnea, unspecified: G47.30

## 2015-08-02 HISTORY — PX: ESOPHAGOGASTRODUODENOSCOPY (EGD) WITH PROPOFOL: SHX5813

## 2015-08-02 LAB — GLUCOSE, CAPILLARY: GLUCOSE-CAPILLARY: 209 mg/dL — AB (ref 65–99)

## 2015-08-02 SURGERY — COLONOSCOPY WITH PROPOFOL
Anesthesia: General

## 2015-08-02 MED ORDER — FENTANYL CITRATE (PF) 100 MCG/2ML IJ SOLN
INTRAMUSCULAR | Status: DC | PRN
  Administered 2015-08-02: 50 ug via INTRAVENOUS

## 2015-08-02 MED ORDER — LIDOCAINE HCL (CARDIAC) 20 MG/ML IV SOLN
INTRAVENOUS | Status: DC | PRN
  Administered 2015-08-02: 30 mg via INTRAVENOUS

## 2015-08-02 MED ORDER — SODIUM CHLORIDE 0.9 % IV SOLN
INTRAVENOUS | Status: DC
  Administered 2015-08-02: 07:00:00 via INTRAVENOUS

## 2015-08-02 MED ORDER — GLYCOPYRROLATE 0.2 MG/ML IJ SOLN
INTRAMUSCULAR | Status: DC | PRN
  Administered 2015-08-02: 0.1 mg via INTRAVENOUS

## 2015-08-02 MED ORDER — SODIUM CHLORIDE 0.9 % IV SOLN
INTRAVENOUS | Status: DC
  Administered 2015-08-02: 1000 mL via INTRAVENOUS

## 2015-08-02 MED ORDER — BUTAMBEN-TETRACAINE-BENZOCAINE 2-2-14 % EX AERO
INHALATION_SPRAY | CUTANEOUS | Status: DC | PRN
  Administered 2015-08-02: 1 via TOPICAL

## 2015-08-02 MED ORDER — PROPOFOL 500 MG/50ML IV EMUL
INTRAVENOUS | Status: DC | PRN
  Administered 2015-08-02: 120 ug/kg/min via INTRAVENOUS

## 2015-08-02 MED ORDER — MIDAZOLAM HCL 2 MG/2ML IJ SOLN
INTRAMUSCULAR | Status: DC | PRN
  Administered 2015-08-02: 1 mg via INTRAVENOUS

## 2015-08-02 NOTE — Anesthesia Procedure Notes (Signed)
Performed by: COOK-MARTIN, Brandee Markin Pre-anesthesia Checklist: Patient identified, Emergency Drugs available, Suction available, Patient being monitored and Timeout performed Patient Re-evaluated:Patient Re-evaluated prior to inductionOxygen Delivery Method: Nasal cannula Preoxygenation: Pre-oxygenation with 100% oxygen Intubation Type: IV induction Airway Equipment and Method: Bite block Placement Confirmation: CO2 detector and positive ETCO2     

## 2015-08-02 NOTE — Transfer of Care (Signed)
Immediate Anesthesia Transfer of Care Note  Patient: Christine Lawson  Procedure(s) Performed: Procedure(s): COLONOSCOPY WITH PROPOFOL (N/A) ESOPHAGOGASTRODUODENOSCOPY (EGD) WITH PROPOFOL (N/A)  Patient Location: PACU  Anesthesia Type:General  Level of Consciousness: awake, alert , oriented and sedated  Airway & Oxygen Therapy: Patient Spontanous Breathing and Patient connected to nasal cannula oxygen  Post-op Assessment: Report given to RN and Post -op Vital signs reviewed and stable  Post vital signs: Reviewed and stable  Last Vitals:  Filed Vitals:   08/02/15 0704  BP: 149/75  Pulse: 84  Temp: 36.6 C  Resp: 18    Last Pain: There were no vitals filed for this visit.       Complications: No apparent anesthesia complications

## 2015-08-02 NOTE — H&P (Signed)
Primary Care Physician:  Tracie Harrier, MD Primary Gastroenterologist:  Dr. Vira Agar  Pre-Procedure History & Physical: HPI:  Christine Lawson is a 55 y.o. female is here for an endoscopy and colonoscopy.   Past Medical History  Diagnosis Date  . Endometrial adenocarcinoma ([place])   . GERD (gastroesophageal reflux disease)   . Hypertension   . Diabetes mellitus without complication ([place])   . Obesity   . Irritable bowel syndrome   . Hematochezia   . IDA (iron deficiency anemia)   . Hyperlipidemia   . Osteoarthritis   . Inguinal lymphadenopathy 07/13/2015  . Sleep apnea     Past Surgical History  Procedure Laterality Date  . Abdominal hysterectomy  feb 2013    Prior to Admission medications   Medication Sig Start Date End Date Taking? Authorizing Provider  albuterol (PROVENTIL HFA;VENTOLIN HFA) 108 (90 Base) MCG/ACT inhaler Inhale 1 puff into the lungs every 6 (six) hours as needed for wheezing or shortness of breath.   Yes Historical Provider, MD  aspirin (ASPIRIN EC) 81 MG EC tablet Take 81 mg by mouth daily. Swallow whole.   Yes Historical Provider, MD  dicyclomine (BENTYL) 20 MG tablet Take 20 mg by mouth every 6 (six) hours.   Yes Historical Provider, MD  solifenacin (VESICARE) 10 MG tablet Take by mouth daily.   Yes Historical Provider, MD  amLODipine (NORVASC) 10 MG tablet Take 10 mg by mouth daily.    Historical Provider, MD  cholecalciferol (VITAMIN D) 1000 UNITS tablet Take 1,000 Units by mouth daily.    Historical Provider, MD  enalapril (VASOTEC) 5 MG tablet Take 5 mg by mouth daily.    Historical Provider, MD  ferrous sulfate 325 (65 FE) MG tablet Take 325 mg by mouth daily after breakfast.    Historical Provider, MD  insulin aspart (NOVOLOG) 100 UNIT/ML injection Inject 20 Units into the skin 3 (three) times daily before meals. 2o units in am, 26 units at lunch and 36 units at supper    Historical Provider, MD  Insulin Glargine (LANTUS SOLOSTAR) 100 UNIT/ML  Solostar Pen Inject 46 Units as directed at bedtime. 03/17/14   Historical Provider, MD  metFORMIN (GLUCOPHAGE-XR) 500 MG 24 hr tablet Take 1 tablet by mouth 2 (two) times daily. 07/26/14   Historical Provider, MD  omeprazole (PRILOSEC) 40 MG capsule Take 40 mg by mouth 2 (two) times daily.    Historical Provider, MD  Probiotic Product (SOLUBLE FIBER/PROBIOTICS PO) Take 1 Dose by mouth 3 (three) times daily.    Historical Provider, MD  vitamin B-12 (CYANOCOBALAMIN) 500 MCG tablet Take 1 tablet by mouth daily.    Historical Provider, MD    Allergies as of 07/23/2015 - Review Complete 02/21/2015  Allergen Reaction Noted  . Contrast media [iodinated diagnostic agents]  11/15/2014  . Isovue [iopamidol] Other (See Comments) 09/15/2014    History reviewed. No pertinent family history.  Social History   Social History  . Marital Status: Single    Spouse Name: N/A  . Number of Children: N/A  . Years of Education: N/A   Occupational History  . Not on file.   Social History Main Topics  . Smoking status: Never Smoker   . Smokeless tobacco: Never Used  . Alcohol Use: No  . Drug Use: No  . Sexual Activity: No   Other Topics Concern  . Not on file   Social History Narrative    Review of Systems: See HPI, otherwise negative ROS  Physical Exam: BP 149/75  mmHg  Pulse 84  Temp(Src) 97.9 F (36.6 C) (Tympanic)  Resp 18  Ht 5\' 3"  (1.6 m)  Wt 115.214 kg (254 lb)  BMI 45.01 kg/m2  SpO2 99% General:   Alert,  pleasant and cooperative in NAD Head:  Normocephalic and atraumatic. Neck:  Supple; no masses or thyromegaly. Lungs:  Clear throughout to auscultation.    Heart:  Regular rate and rhythm. Abdomen:  Soft, nontender and nondistended. Normal bowel sounds, without guarding, and without rebound.   Neurologic:  Alert and  oriented x4;  grossly normal neurologically.  Impression/Plan: Christine Lawson is here for an endoscopy and colonoscopy to be performed for generalized abd  pain.  Risks, benefits, limitations, and alternatives regarding  endoscopy and colonoscopy have been reviewed with the patient.  Questions have been answered.  All parties agreeable.   Gaylyn Cheers, MD  08/02/2015, 7:23 AM

## 2015-08-02 NOTE — Anesthesia Preprocedure Evaluation (Signed)
Anesthesia Evaluation  Patient identified by MRN, date of birth, ID band Patient awake    Reviewed: Allergy & Precautions, H&P , NPO status , Patient's Chart, lab work & pertinent test results, reviewed documented beta blocker date and time   Airway Mallampati: II   Neck ROM: full    Dental  (+) Poor Dentition   Pulmonary neg pulmonary ROS,    Pulmonary exam normal        Cardiovascular hypertension, negative cardio ROS Normal cardiovascular exam     Neuro/Psych negative neurological ROS  negative psych ROS   GI/Hepatic negative GI ROS, Neg liver ROS, GERD  Medicated,  Endo/Other  negative endocrine ROSdiabetes  Renal/GU negative Renal ROS  negative genitourinary   Musculoskeletal   Abdominal   Peds  Hematology negative hematology ROS (+)   Anesthesia Other Findings Past Medical History:   Endometrial adenocarcinoma ([place])                             GERD (gastroesophageal reflux disease)                       Hypertension                                                 Diabetes mellitus without complication (HCC)                 Obesity                                                      Irritable bowel syndrome                                     Hematochezia                                                 IDA (iron deficiency anemia)                                 Hyperlipidemia                                               Osteoarthritis                                               Inguinal lymphadenopathy                        07/13/2015  Past Surgical History:   ABDOMINAL HYSTERECTOMY  feb 2013     Reproductive/Obstetrics                             Anesthesia Physical Anesthesia Plan  ASA: III  Anesthesia Plan: General   Post-op Pain Management:    Induction:   Airway Management Planned:   Additional Equipment:   Intra-op Plan:    Post-operative Plan:   Informed Consent: I have reviewed the patients History and Physical, chart, labs and discussed the procedure including the risks, benefits and alternatives for the proposed anesthesia with the patient or authorized representative who has indicated his/her understanding and acceptance.   Dental Advisory Given  Plan Discussed with: CRNA  Anesthesia Plan Comments:         Anesthesia Quick Evaluation

## 2015-08-02 NOTE — Op Note (Signed)
[facility] Gastroenterology Patient Name: Christine Lawson Procedure Date: 08/02/2015 7:34 AM MRN: [id number] Account #: [id number] Date of Birth: [birth year] Admit Type: Outpatient Age: 55 Room: [facility] ENDO ROOM 2 Gender: Female Note Status: Finalized Procedure:            Colonoscopy Indications:          Generalized abdominal pain Providers:            Manya Silvas, MD Referring MD:         Tracie Harrier, MD (Referring MD) Medicines:            Propofol per Anesthesia Complications:        No immediate complications. Procedure:            Pre-Anesthesia Assessment:                       - After reviewing the risks and benefits, the patient                        was deemed in satisfactory condition to undergo the                        procedure.                       After obtaining informed consent, the colonoscope was                        passed under direct vision. Throughout the procedure,                        the patient's blood pressure, pulse, and oxygen                        saturations were monitored continuously. The                        Colonoscope was introduced through the anus and                        advanced to the the cecum, identified by appendiceal                        orifice and ileocecal valve. The colonoscopy was                        performed without difficulty. The patient tolerated the                        procedure well. The quality of the bowel preparation                        was good. Findings:      Internal hemorrhoids were found during endoscopy. The hemorrhoids were       small and Grade I (internal hemorrhoids that do not prolapse).      A single (solitary) two mm ulcer was found in the transverse colon.       Stigmata of recent bleeding were present. Biopsies were taken with a       cold forceps for histology.      Bx done of ascending colon  for microscopic evaluation. Impression:           -  Internal hemorrhoids.                       - A single (solitary) ulcer in the transverse colon.                        Biopsied. Recommendation:       - Await pathology results. Manya Silvas, MD 08/02/2015 8:09:17 AM This report has been signed electronically. Number of Addenda: 0 Note Initiated On: 08/02/2015 7:34 AM Scope Withdrawal Time: 0 hours 9 minutes 19 seconds  Total Procedure Duration: 0 hours 12 minutes 45 seconds       [facility]

## 2015-08-02 NOTE — Op Note (Signed)
[facility] Gastroenterology Patient Name: Christine Lawson Procedure Date: 08/02/2015 7:34 AM MRN: [id number] Account #: [id number] Date of Birth: Aug 13, 1960 Admit Type: Outpatient Age: 55 Room: [facility] ENDO ROOM 2 Gender: Female Note Status: Finalized Procedure:            Upper GI endoscopy Indications:          Epigastric abdominal pain, Generalized abdominal pain Providers:            Manya Silvas, MD Referring MD:         Tracie Harrier, MD (Referring MD) Medicines:            Propofol per Anesthesia Complications:        No immediate complications. Procedure:            Pre-Anesthesia Assessment:                       - After reviewing the risks and benefits, the patient                        was deemed in satisfactory condition to undergo the                        procedure.                       After obtaining informed consent, the endoscope was                        passed under direct vision. Throughout the procedure,                        the patient's blood pressure, pulse, and oxygen                        saturations were monitored continuously. The Endoscope                        was introduced through the mouth, and advanced to the                        second part of duodenum. The upper GI endoscopy was                        accomplished without difficulty. The patient tolerated                        the procedure well. Findings:      She appears to have very large tonsils.      LA Grade A (one or more mucosal breaks less than 5 mm, not extending       between tops of 2 mucosal folds) esophagitis with no bleeding was found       38 cm from the incisors. Biopsies were taken with a cold forceps for       histology.      The entire examined stomach was normal. Biopsies were taken with a cold       forceps for histology. Biopsies were taken with a cold forceps for       Helicobacter pylori testing.      The examined duodenum was  normal. Impression:           -  LA Grade A reflux esophagitis. Rule out Barrett's                        esophagus. Biopsied.                       - Normal stomach. Biopsied.                       - Normal examined duodenum. Recommendation:       - Await pathology results. Manya Silvas, MD 08/02/2015 7:51:33 AM This report has been signed electronically. Number of Addenda: 0 Note Initiated On: 08/02/2015 7:34 AM      [facility]

## 2015-08-03 LAB — SURGICAL PATHOLOGY

## 2015-08-03 NOTE — Anesthesia Postprocedure Evaluation (Signed)
Anesthesia Post Note  Patient: Christine Lawson  Procedure(s) Performed: Procedure(s) (LRB): COLONOSCOPY WITH PROPOFOL (N/A) ESOPHAGOGASTRODUODENOSCOPY (EGD) WITH PROPOFOL (N/A)  Patient location during evaluation: PACU Anesthesia Type: General Level of consciousness: awake and alert Pain management: pain level controlled Vital Signs Assessment: post-procedure vital signs reviewed and stable Respiratory status: spontaneous breathing, nonlabored ventilation, respiratory function stable and patient connected to nasal cannula oxygen Cardiovascular status: blood pressure returned to baseline and stable Postop Assessment: no signs of nausea or vomiting Anesthetic complications: no    Last Vitals:  Filed Vitals:   08/02/15 0830 08/02/15 0840  BP: 131/62 137/65  Pulse: 87 90  Temp:    Resp: 17 12    Last Pain:  Filed Vitals:   08/03/15 0738  PainSc: [place]

## 2015-08-10 ENCOUNTER — Ambulatory Visit: Payer: Medicare Other

## 2015-08-10 ENCOUNTER — Other Ambulatory Visit: Payer: Medicare Other

## 2015-08-21 ENCOUNTER — Ambulatory Visit
Admission: RE | Admit: 2015-08-21 | Discharge: 2015-08-21 | Disposition: A | Discharge status: Home / Self Care | Payer: Medicare Other | Source: Ambulatory Visit | Attending: Internal Medicine | Admitting: Internal Medicine

## 2015-08-21 DIAGNOSIS — N63 Unspecified lump in unspecified breast: Secondary | ICD-10-CM

## 2015-09-14 ENCOUNTER — Other Ambulatory Visit: Payer: Medicare Other

## 2015-09-14 ENCOUNTER — Ambulatory Visit: Payer: Medicare Other | Admitting: Oncology

## 2015-11-14 ENCOUNTER — Encounter: Payer: Self-pay | Admitting: Obstetrics and Gynecology

## 2015-11-14 ENCOUNTER — Inpatient Hospital Stay: Payer: Medicare Other | Attending: Obstetrics and Gynecology | Admitting: Obstetrics and Gynecology

## 2015-11-14 ENCOUNTER — Telehealth: Payer: Self-pay

## 2015-11-14 VITALS — BP 155/81 | HR 80 | Temp 97.5°F | Ht 63.0 in | Wt 259.9 lb

## 2015-11-14 DIAGNOSIS — Z794 Long term (current) use of insulin: Secondary | ICD-10-CM | POA: Insufficient documentation

## 2015-11-14 DIAGNOSIS — N898 Other specified noninflammatory disorders of vagina: Secondary | ICD-10-CM | POA: Diagnosis not present

## 2015-11-14 DIAGNOSIS — E785 Hyperlipidemia, unspecified: Secondary | ICD-10-CM | POA: Diagnosis not present

## 2015-11-14 DIAGNOSIS — Z90710 Acquired absence of both cervix and uterus: Secondary | ICD-10-CM | POA: Insufficient documentation

## 2015-11-14 DIAGNOSIS — Z8542 Personal history of malignant neoplasm of other parts of uterus: Secondary | ICD-10-CM | POA: Diagnosis not present

## 2015-11-14 DIAGNOSIS — Z6841 Body Mass Index (BMI) 40.0 and over, adult: Secondary | ICD-10-CM | POA: Diagnosis not present

## 2015-11-14 DIAGNOSIS — Z7982 Long term (current) use of aspirin: Secondary | ICD-10-CM | POA: Insufficient documentation

## 2015-11-14 DIAGNOSIS — Z79899 Other long term (current) drug therapy: Secondary | ICD-10-CM | POA: Diagnosis not present

## 2015-11-14 DIAGNOSIS — I10 Essential (primary) hypertension: Secondary | ICD-10-CM | POA: Diagnosis not present

## 2015-11-14 DIAGNOSIS — E669 Obesity, unspecified: Secondary | ICD-10-CM | POA: Diagnosis not present

## 2015-11-14 DIAGNOSIS — K580 Irritable bowel syndrome with diarrhea: Secondary | ICD-10-CM | POA: Insufficient documentation

## 2015-11-14 DIAGNOSIS — C541 Malignant neoplasm of endometrium: Secondary | ICD-10-CM

## 2015-11-14 DIAGNOSIS — E119 Type 2 diabetes mellitus without complications: Secondary | ICD-10-CM | POA: Insufficient documentation

## 2015-11-14 DIAGNOSIS — M1990 Unspecified osteoarthritis, unspecified site: Secondary | ICD-10-CM

## 2015-11-14 DIAGNOSIS — K219 Gastro-esophageal reflux disease without esophagitis: Secondary | ICD-10-CM

## 2015-11-14 DIAGNOSIS — Z7984 Long term (current) use of oral hypoglycemic drugs: Secondary | ICD-10-CM | POA: Diagnosis not present

## 2015-11-14 DIAGNOSIS — G4730 Sleep apnea, unspecified: Secondary | ICD-10-CM | POA: Insufficient documentation

## 2015-11-14 DIAGNOSIS — L989 Disorder of the skin and subcutaneous tissue, unspecified: Secondary | ICD-10-CM | POA: Diagnosis not present

## 2015-11-14 LAB — WET PREP, GENITAL
Clue Cells Wet Prep HPF POC: NONE SEEN
Sperm: NONE SEEN
TRICH WET PREP: NONE SEEN
YEAST WET PREP: NONE SEEN

## 2015-11-14 NOTE — Progress Notes (Signed)
  Oncology Nurse Navigator Documentation Chaperoned pelvic exam. Will follow up with Dr Ouida Sills in 45months and Dr Theora Gianotti in one year.   Navigator Location: [facility] (11/14/15 1100) Navigator Encounter Type: Clinic/MDC;Follow-up Appt (11/14/15 1100)                                          Time Spent with Patient: 15 (11/14/15 1100)

## 2015-11-14 NOTE — Progress Notes (Addendum)
Gynecologic Oncology Consult Visit   Referring Provider: Dr. Ouida Sills  Chief Concern: Continued surveillance for endometrial cancer  Subjective:  Christine Lawson is a 55 y.o. female who is seen for continued surveillance for endometrial cancer.   She presents today with complaints of right knee pain and bladder pain if she holds her urine for a long time. No complaints of vaginal bleeding.  She continues to alternate surveillance with Dr. Ouida Sills at her last visit with him was unremarkable March 2017.   Gynecologic Oncology History  Christine Lawson is a pleasant female who is seen in consultation from Dr. Ouida Sills 04/2011     endometroid adenocarcinoma, grade 1-2, stage Ia (invasion 4 of 26mm, no LVSI),                TAHBSO and staging NED since surgery.  In 2016 she was complaining of constant complaints of abdominal pain, bloating and distention. She is also noted decreased appetite and diarrhea. She is seen by Dr. Tiffany Kocher in gastroenterology and has been diagnosed with possible IBS per her and her sister's report. To evaluate for recurrence we ordered a CT scan.  CT scan on 11/17/2014 IMPRESSION: 1. Nonobstructive gas pattern with no findings to account for patient's symptoms.  2. Bilateral inguinal adenopathy,most prominent in the upper right inguinal/inferior right pelvic region. All nodes are similar except for one new node on the right measuring 27 x 29mm. Metastatic disease not excluded.  3. Stable fluid collection left pelvis likely a postoperative fluid Collection.  She was supposed to have a repeat scan in January but she missed that appointment.   CT 07/12/2015    ADDENDUM REPORT: 07/13/2015 14:57 ADDENDUM: Revision to body and impression. Additional history: Patient is reportedly post BILATERAL oophorectomy. The low-attenuation suspected cystic structures in the pelvis bilaterally, LEFT 4.0 x 3.1 x 4.8 cm and RIGHT 3.4 x 1.8 x 2.9  cm, therefore do not represent ovarian cysts. Differential diagnosis then would include chronic seromas, postoperative lymphoceles, and cystic/necrotic lymph nodes. These could be characterized by MR imaging. However, in light of BILATERAL inguinal adenopathy also identified, these could be evaluated concurrently by PET-CT imaging at the time of assessment of the inguinal adenopathy.     PET/CT 07/25/2015 ABDOMEN/PELVIS:Inguinal lymph nodes measure up to 1.7 cm on the right (CT image 233) with an SUV max of 2.9. Lymph nodes are stable in size from 11/17/2014. No abnormal hypermetabolism in the liver, adrenal glands, spleen or pancreas. Liver is decreased in attenuation diffusely. Gallbladder, adrenal glands, kidneys, spleen, pancreas, stomach and bowel are unremarkable with exception of a tiny hiatal hernia. Hysterectomy and bilateral salpingo-oophorectomy. Low-attenuation lesions in the adnexal regions measure up to 4.2 cm on the left and show no abnormal hypermetabolism. Postoperative changes along the midline ventral abdominal pelvic wall with scattered fat herniation.  IMPRESSION: 1. Borderline hypermetabolic inguinal lymph nodes are unchanged in size from 11/17/2014, arguing against metastatic disease. 2. Low-attenuation lesions in the adnexal regions are not hypermetabolic, favoring postoperative seromas. 3. Hepatic steatosis.      Problem List: Patient Active Problem List   Diagnosis Date Noted  . History of endometrial cancer 08/01/2015  . Endometrial adenocarcinoma ([place]) 07/13/2015    Class: History of  . Inguinal lymphadenopathy 07/13/2015  . Cancer ([place]) 09/05/2014  . Long term current use of insulin ([place]) 12/12/2013  . Microalbuminuria 12/12/2013  . Diabetes ([place]) 07/28/2013  . BP (high blood pressure) 07/28/2013  . HLD (hyperlipidemia) 07/28/2013  . Adiposity 07/28/2013  . Obstructive apnea  07/28/2013  . Arthritis, degenerative 07/28/2013  . Apnea,  sleep 07/28/2013  . Thyroid nodule 07/28/2013    Past Medical History: Past Medical History:  Diagnosis Date  . Diabetes mellitus without complication ([place])   . Endometrial adenocarcinoma ([place])   . GERD (gastroesophageal reflux disease)   . Hematochezia   . Hyperlipidemia   . Hypertension   . IDA (iron deficiency anemia)   . Inguinal lymphadenopathy 07/13/2015  . Irritable bowel syndrome   . Obesity   . Osteoarthritis   . Sleep apnea     Past Surgical History: Past Surgical History:  Procedure Laterality Date  . ABDOMINAL HYSTERECTOMY  feb 2013  . COLONOSCOPY WITH PROPOFOL N/A 08/02/2015   Procedure: COLONOSCOPY WITH PROPOFOL;  Surgeon: Manya Silvas, MD;  Location: [facility] ENDOSCOPY;  Service: Endoscopy;  Laterality: N/A;  . ESOPHAGOGASTRODUODENOSCOPY (EGD) WITH PROPOFOL N/A 08/02/2015   Procedure: ESOPHAGOGASTRODUODENOSCOPY (EGD) WITH PROPOFOL;  Surgeon: Manya Silvas, MD;  Location[facility] ENDOSCOPY;  Service: Endoscopy;  Laterality: N/A;    Past Gynecologic History:  See gynecologic oncology history     OB History:  OB History  No data available    Family History: History reviewed. No pertinent family history.  Social History: Social History   Social History  . Marital status: Single    Spouse name: N/A  . Number of children: N/A  . Years of education: N/A   Occupational History  . Not on file.   Social History Main Topics  . Smoking status: Never Smoker  . Smokeless tobacco: Never Used  . Alcohol use No  . Drug use: No  . Sexual activity: No   Other Topics Concern  . Not on file   Social History Narrative  . No narrative on file    Allergies: Allergies  Allergen Reactions  . Contrast Media [Iodinated Diagnostic Agents]   . Isovue [Iopamidol] Other (See Comments)    Wheezing     Current Medications: Current Outpatient Prescriptions  Medication Sig Dispense Refill  . albuterol (PROVENTIL HFA;VENTOLIN HFA) 108 (90 Base) MCG/ACT inhaler  Inhale 1 puff into the lungs every 6 (six) hours as needed for wheezing or shortness of breath.    . allopurinol (ZYLOPRIM) 100 MG tablet Take by mouth.    Marland Kitchen amLODipine (NORVASC) 10 MG tablet Take 10 mg by mouth daily.    Marland Kitchen aspirin (ASPIRIN EC) 81 MG EC tablet Take 81 mg by mouth daily. Swallow whole.    . cholecalciferol (VITAMIN D) 1000 UNITS tablet Take 1,000 Units by mouth daily.    Marland Kitchen dicyclomine (BENTYL) 20 MG tablet Take 20 mg by mouth every 6 (six) hours.    . enalapril (VASOTEC) 5 MG tablet Take 5 mg by mouth daily.    . ferrous sulfate 325 (65 FE) MG tablet Take 325 mg by mouth daily after breakfast.    . insulin aspart (NOVOLOG) 100 UNIT/ML injection Inject 20 Units into the skin 3 (three) times daily before meals. 2o units in am, 26 units at lunch and 36 units at supper    . Insulin Glargine (LANTUS SOLOSTAR) 100 UNIT/ML Solostar Pen Inject 46 Units as directed at bedtime.    . insulin lispro (HUMALOG) 100 UNIT/ML KiwkPen 24 units with breakfast, 28 units with lunch, 50 units with supper    . metFORMIN (GLUCOPHAGE-XR) 500 MG 24 hr tablet Take 1 tablet by mouth 2 (two) times daily.    Marland Kitchen omeprazole (PRILOSEC) 40 MG capsule Take 40 mg by mouth 2 (two)  times daily.    . Probiotic Product (SOLUBLE FIBER/PROBIOTICS PO) Take 1 Dose by mouth 3 (three) times daily.    . solifenacin (VESICARE) 10 MG tablet Take by mouth daily.    . vitamin B-12 (CYANOCOBALAMIN) 500 MCG tablet Take 1 tablet by mouth daily.     No current facility-administered medications for this visit.     Review of Systems General: no complaints  HEENT: no complaints  Lungs: no complaints  Cardiac: no complaints  GI: no complaints  GU: as per HPI  Musculoskeletal: no complaints  Extremities: Right knee pain  Skin: no complaints  Neuro: no complaints  Endocrine: no complaints  Psych: no complaints       Objective:  Physical Examination:  BP (!) 155/81 (BP Location: Right Wrist, Patient Position: Sitting)    Pulse 80   Temp 97.5 F (36.4 C) (Tympanic)   Ht 5\' 3"  (1.6 m)   Wt 259 lb 14.8 oz (117.9 kg)   BMI 46.04 kg/m    ECOG Performance Status: 1 - Symptomatic but completely ambulatory  General appearance: alert, cooperative and appears stated age HEENT:PERRLA, extra ocular movement intact and sclera clear, anicteric Lymph node survey: non-palpable, axillary, inguinal, supraclavicular Cardiovascular: regular rate and rhythm Respiratory: decreased breath sounds, lungs clear to auscultation Abdomen: obese, firm, protuberant, distended, nontender, hernia at the umbilical site, and well healed incision Extremities: edema  chronic per patient. Abnormal mole on left thigh.  Neurological exam reveals alert, oriented, normal speech, no focal findings or movement disorder noted. She walks with a cane.   Pelvic: Deferred to 11/2015. Last exam 11/2014 exam chaperoned by nurse;  Vulva: normal appearing vulva with no masses, tenderness or lesions; Vagina: normal vagina; Adnexa: surgically absent bilateral, limited exam due to habitus and patient unable to tolerate exam; Uterus: surgically absent, vaginal cuff well healed; Cervix: absent; Rectal: not performed   Lab Review Labs on site today: n/a  Radiologic Imaging: n/a    Assessment:  Dura Douthat is a 55 y.o. female diagnosed with endometroid adenocarcinoma, grade 1-2, stage Ia endometrial cancer, NED. Inguinal adenopathy, borderline. Probable bilateral lymphocysts, asymptomatic. GI symptoms and abdominal pain, resolved. PET/CT scans were reassuring. Abnormal skin lesion left thigh concerning for abnormal nevus/melanoma. Possible vaginal infection. Medical co-morbidities complicating care: HTN, diabetes and obesity.  Plan:   Problem List Items Addressed This Visit    None    Visit Diagnoses    Endometrial cancer ([place])    -  Primary   Relevant Medications   allopurinol (ZYLOPRIM) 100 MG tablet     .  We will continue to alternate  visits with Dr. Ouida Sills every 6 months. She will RTC in 11/2016 at that time if NED she will be released from [facility] clinic.   With regard to the abnormal skin lesion I have recommended removal and to see either her PCP or dermatologist and to contact them today.   After the exam the patient complained of an odor and intermittent itching. We will send a Wet prep.   I have spoken to her and her sister previously regarding the risks of morbid obesity including multiple medical issues that they already have as well as cancers and risk of increased mortality.   Gillis Ends, MD    CC:  Dr. Ouida Sills  Dr. Vira Agar

## 2015-11-14 NOTE — Addendum Note (Signed)
Addended by: Gillis Ends on: 11/14/2015 11:20 AM   Modules accepted: Orders

## 2015-11-14 NOTE — Telephone Encounter (Signed)
  Oncology Nurse Navigator Documentation  Navigator Location: [facility] (11/14/15 1300) Navigator Encounter Type: Telephone;Diagnostic Results (11/14/15 1300)                                          Time Spent with Patient: 15 (11/14/15 1300)   Notified of wet prep results. No further treatment needed per Dr. Theora Gianotti

## 2015-11-14 NOTE — Progress Notes (Signed)
  Oncology Nurse Navigator Documentation Wet prep was collected from speculum and sent to lab Navigator Location: [facility] (11/14/15 1138) Navigator Encounter Type: Clinic/MDC (11/14/15 1138)                                          Time Spent with Patient: 15 (11/14/15 1138)

## 2015-11-14 NOTE — Progress Notes (Signed)
Patient here for follow up. States she experiences pain if she "holds" her urine. Chronic knee pain continues.

## 2015-11-16 ENCOUNTER — Encounter (INDEPENDENT_AMBULATORY_CARE_PROVIDER_SITE_OTHER): Payer: Self-pay

## 2015-12-12 ENCOUNTER — Inpatient Hospital Stay: Payer: Medicare Other | Admitting: Internal Medicine

## 2015-12-13 ENCOUNTER — Inpatient Hospital Stay: Payer: Medicare Other | Admitting: Internal Medicine

## 2015-12-17 ENCOUNTER — Ambulatory Visit (INDEPENDENT_AMBULATORY_CARE_PROVIDER_SITE_OTHER): Payer: Medicare Other | Admitting: Vascular Surgery

## 2015-12-26 ENCOUNTER — Inpatient Hospital Stay: Payer: Medicare Other | Admitting: Oncology

## 2015-12-31 ENCOUNTER — Ambulatory Visit (INDEPENDENT_AMBULATORY_CARE_PROVIDER_SITE_OTHER): Payer: Medicare Other | Admitting: Vascular Surgery

## 2016-01-02 DIAGNOSIS — J4489 Other specified chronic obstructive pulmonary disease: Secondary | ICD-10-CM | POA: Insufficient documentation

## 2016-01-07 ENCOUNTER — Ambulatory Visit (INDEPENDENT_AMBULATORY_CARE_PROVIDER_SITE_OTHER): Payer: Medicare Other | Admitting: Vascular Surgery

## 2016-01-09 ENCOUNTER — Other Ambulatory Visit: Payer: Self-pay

## 2016-01-09 ENCOUNTER — Inpatient Hospital Stay: Payer: Medicare Other | Attending: Internal Medicine | Admitting: Internal Medicine

## 2016-01-09 ENCOUNTER — Inpatient Hospital Stay: Payer: Medicare Other

## 2016-01-09 VITALS — BP 126/72 | HR 87 | Temp 96.4°F | Wt 263.2 lb

## 2016-01-09 DIAGNOSIS — I10 Essential (primary) hypertension: Secondary | ICD-10-CM | POA: Insufficient documentation

## 2016-01-09 DIAGNOSIS — E785 Hyperlipidemia, unspecified: Secondary | ICD-10-CM | POA: Diagnosis not present

## 2016-01-09 DIAGNOSIS — R590 Localized enlarged lymph nodes: Secondary | ICD-10-CM

## 2016-01-09 DIAGNOSIS — E119 Type 2 diabetes mellitus without complications: Secondary | ICD-10-CM | POA: Insufficient documentation

## 2016-01-09 DIAGNOSIS — D500 Iron deficiency anemia secondary to blood loss (chronic): Secondary | ICD-10-CM

## 2016-01-09 DIAGNOSIS — K589 Irritable bowel syndrome without diarrhea: Secondary | ICD-10-CM | POA: Insufficient documentation

## 2016-01-09 DIAGNOSIS — M1990 Unspecified osteoarthritis, unspecified site: Secondary | ICD-10-CM

## 2016-01-09 DIAGNOSIS — C541 Malignant neoplasm of endometrium: Secondary | ICD-10-CM

## 2016-01-09 DIAGNOSIS — R5383 Other fatigue: Secondary | ICD-10-CM | POA: Diagnosis not present

## 2016-01-09 DIAGNOSIS — Z794 Long term (current) use of insulin: Secondary | ICD-10-CM | POA: Diagnosis not present

## 2016-01-09 DIAGNOSIS — E669 Obesity, unspecified: Secondary | ICD-10-CM

## 2016-01-09 DIAGNOSIS — D509 Iron deficiency anemia, unspecified: Secondary | ICD-10-CM | POA: Insufficient documentation

## 2016-01-09 DIAGNOSIS — Z79899 Other long term (current) drug therapy: Secondary | ICD-10-CM | POA: Diagnosis not present

## 2016-01-09 DIAGNOSIS — Z7982 Long term (current) use of aspirin: Secondary | ICD-10-CM

## 2016-01-09 DIAGNOSIS — G4730 Sleep apnea, unspecified: Secondary | ICD-10-CM

## 2016-01-09 DIAGNOSIS — Z8542 Personal history of malignant neoplasm of other parts of uterus: Secondary | ICD-10-CM

## 2016-01-09 DIAGNOSIS — K219 Gastro-esophageal reflux disease without esophagitis: Secondary | ICD-10-CM | POA: Diagnosis not present

## 2016-01-09 LAB — CBC WITH DIFFERENTIAL/PLATELET
BASOS ABS: 0.1 10*3/uL (ref 0–0.1)
Basophils Relative: 1 %
EOS ABS: 0.4 10*3/uL (ref 0–0.7)
EOS PCT: 4 %
HCT: 37.9 % (ref 35.0–47.0)
HEMOGLOBIN: 12.2 g/dL (ref 12.0–16.0)
LYMPHS ABS: 1.7 10*3/uL (ref 1.0–3.6)
Lymphocytes Relative: 16 %
MCH: 25.7 pg — AB (ref 26.0–34.0)
MCHC: 32.2 g/dL (ref 32.0–36.0)
MCV: 79.7 fL — ABNORMAL LOW (ref 80.0–100.0)
Monocytes Absolute: 0.6 10*3/uL (ref 0.2–0.9)
Monocytes Relative: 6 %
NEUTROS PCT: 73 %
Neutro Abs: 7.7 10*3/uL — ABNORMAL HIGH (ref 1.4–6.5)
PLATELETS: 436 10*3/uL (ref 150–440)
RBC: 4.75 MIL/uL (ref 3.80–5.20)
RDW: 14.6 % — ABNORMAL HIGH (ref 11.5–14.5)
WBC: 10.4 10*3/uL (ref 3.6–11.0)

## 2016-01-09 LAB — FERRITIN: FERRITIN: 151 ng/mL (ref 11–307)

## 2016-01-09 LAB — IRON AND TIBC
IRON: 40 ug/dL (ref 28–170)
SATURATION RATIOS: 12 % (ref 10.4–31.8)
TIBC: 323 ug/dL (ref 250–450)
UIBC: 283 ug/dL

## 2016-01-09 NOTE — Progress Notes (Signed)
[place] OFFICE PROGRESS NOTE  Patient Care Team: Tracie Harrier, MD as PCP - General (Internal Medicine)  No matching staging information was found for the patient.     Endometrial adenocarcinoma ([place])   07/13/2015 Initial Diagnosis    Endometrial adenocarcinoma ([place])     Anemia of iron deficiency unresponsive to oral iron therapy - got IV Venofer 1 g in Feb 2015, then on oral Ferrous sulfate 1 tablet daily. (labs on 03/28/13 had showed hemoglobin 10.0, MCV 71.3, WBC 10,800, ANC 7990, ALC 1640, platelet count 564, ferritin 27, serum iron 33, iron saturation low at 7%, TIBC 452. In June 2014 - hemoglobin 9.8, WBC 10,700, platelets 551, LFT unremarkable except alkaline phos of 120, creatinine 0.6). Stool Hemoccult was negative x2 in November 2014.  Colonoscopy July 2013 had shown internal hemorrhoids.  This is my first interaction with the patient as patient's primary oncologist has been Dr.Pandit I reviewed the patient's prior charts/pertinent labs/imaging in detail; findings are summarized above.     INTERVAL HISTORY:  Christine Lawson 55 y.o.  female pleasant patient above history of iron deficiency anemia; also history of early stage endometrial cancer is here for follow-up  Patient complains of chronic fatigue. Denies any new symptoms of blood in stools black colored stools. Denies any blood in urine. Patient does not take by mouth iron because of intolerance.  REVIEW OF SYSTEMS:  A complete 10 point review of system is done which is negative except mentioned above/history of present illness.   PAST MEDICAL HISTORY :  Past Medical History:  Diagnosis Date  . Diabetes mellitus without complication ([place])   . Endometrial adenocarcinoma ([place])   . GERD (gastroesophageal reflux disease)   . Hematochezia   . Hyperlipidemia   . Hypertension   . IDA (iron deficiency anemia)   . Inguinal lymphadenopathy 07/13/2015  . Irritable bowel syndrome   . Obesity   .  Osteoarthritis   . Sleep apnea     PAST SURGICAL HISTORY :   Past Surgical History:  Procedure Laterality Date  . ABDOMINAL HYSTERECTOMY  feb 2013  . COLONOSCOPY WITH PROPOFOL N/A 08/02/2015   Procedure: COLONOSCOPY WITH PROPOFOL;  Surgeon: Manya Silvas, MD;  Location: [facility] ENDOSCOPY;  Service: Endoscopy;  Laterality: N/A;  . ESOPHAGOGASTRODUODENOSCOPY (EGD) WITH PROPOFOL N/A 08/02/2015   Procedure: ESOPHAGOGASTRODUODENOSCOPY (EGD) WITH PROPOFOL;  Surgeon: Manya Silvas, MD;  Location: [facility] ENDOSCOPY;  Service: Endoscopy;  Laterality: N/A;    FAMILY HISTORY :   Family History  Problem Relation Age of Onset  . Diabetes Mother   . Diabetes Father     SOCIAL HISTORY:   Social History  Substance Use Topics  . Smoking status: Never Smoker  . Smokeless tobacco: Never Used  . Alcohol use No    ALLERGIES:  is allergic to solifenacin; contrast media [iodinated diagnostic agents]; dicyclomine; and isovue [iopamidol].  MEDICATIONS:  Current Outpatient Prescriptions  Medication Sig Dispense Refill  . albuterol (PROVENTIL HFA;VENTOLIN HFA) 108 (90 Base) MCG/ACT inhaler Inhale 1 puff into the lungs every 6 (six) hours as needed for wheezing or shortness of breath.    . allopurinol (ZYLOPRIM) 100 MG tablet Take by mouth.    Marland Kitchen amLODipine (NORVASC) 10 MG tablet Take 10 mg by mouth daily.    Marland Kitchen aspirin (ASPIRIN EC) 81 MG EC tablet Take 81 mg by mouth daily. Swallow whole.    . cholecalciferol (VITAMIN D) 1000 UNITS tablet Take 1,000 Units by mouth daily.    Marland Kitchen  dicyclomine (BENTYL) 20 MG tablet Take 20 mg by mouth every 6 (six) hours.    . enalapril (VASOTEC) 5 MG tablet Take 5 mg by mouth daily.    . ferrous sulfate 325 (65 FE) MG tablet Take 325 mg by mouth daily after breakfast.    . insulin aspart (NOVOLOG) 100 UNIT/ML injection Inject 20 Units into the skin 3 (three) times daily before meals. 2o units in am, 26 units at lunch and 36 units at supper    . Insulin Glargine (LANTUS  SOLOSTAR) 100 UNIT/ML Solostar Pen Inject 46 Units as directed at bedtime.    . insulin lispro (HUMALOG) 100 UNIT/ML KiwkPen 24 units with breakfast, 28 units with lunch, 50 units with supper    . metFORMIN (GLUCOPHAGE-XR) 500 MG 24 hr tablet Take 1 tablet by mouth 2 (two) times daily.    Marland Kitchen omeprazole (PRILOSEC) 40 MG capsule Take 40 mg by mouth 2 (two) times daily.    . Probiotic Product (SOLUBLE FIBER/PROBIOTICS PO) Take 1 Dose by mouth 3 (three) times daily.    . solifenacin (VESICARE) 10 MG tablet Take by mouth daily.    . vitamin B-12 (CYANOCOBALAMIN) 500 MCG tablet Take 1 tablet by mouth daily.     No current facility-administered medications for this visit.     PHYSICAL EXAMINATION: ECOG PERFORMANCE STATUS: 0 - Asymptomatic  BP 126/72 (BP Location: Left Arm, Patient Position: Sitting)   Pulse 87   Temp (!) 96.4 F (35.8 C) (Tympanic)   Wt 263 lb 3.2 oz (119.4 kg)   BMI 46.62 kg/m   Filed Weights   01/09/16 1603  Weight: 263 lb 3.2 oz (119.4 kg)    GENERAL: Well-nourished well-developed; Alert, no distress and comfortable.   Accompanied by her twin sister.  EYES: no pallor or icterus OROPHARYNX: no thrush or ulceration; good dentition  NECK: supple, no masses felt LYMPH:  no palpable lymphadenopathy in the cervical, axillary or inguinal regions LUNGS: clear to auscultation and  No wheeze or crackles HEART/CVS: regular rate & rhythm and no murmurs; No lower extremity edema ABDOMEN:abdomen soft, non-tender and normal bowel sounds Musculoskeletal:no cyanosis of digits and no clubbing  PSYCH: alert & oriented x 3 with fluent speech NEURO: no focal motor/sensory deficits SKIN:  no rashes or significant lesions  LABORATORY DATA:  I have reviewed the data as listed    Component Value Date/Time   NA 141 06/03/2013 2039   K 3.7 06/03/2013 2039   CL 106 06/03/2013 2039   CO2 30 06/03/2013 2039   GLUCOSE 59 (L) 06/03/2013 2039   BUN 6 (L) 06/03/2013 2039   CREATININE 0.57  11/15/2014 1629   CREATININE 0.59 (L) 06/03/2013 2039   CALCIUM 8.8 06/03/2013 2039   PROT 8.8 (H) 06/03/2013 2039   ALBUMIN 3.6 06/03/2013 2039   AST 38 (H) 06/03/2013 2039   ALT 44 06/03/2013 2039   ALKPHOS 134 (H) 06/03/2013 2039   BILITOT 0.4 06/03/2013 2039   GFRNONAA >60 11/15/2014 1629   GFRNONAA >60 06/03/2013 2039   GFRAA >60 11/15/2014 1629   GFRAA >60 06/03/2013 2039    No results found for: SPEP, UPEP  Lab Results  Component Value Date   WBC 10.4 01/09/2016   NEUTROABS 7.7 (H) 01/09/2016   HGB 12.2 01/09/2016   HCT 37.9 01/09/2016   MCV 79.7 (L) 01/09/2016   PLT 436 01/09/2016      Chemistry      Component Value Date/Time   NA 141 06/03/2013 2039  K 3.7 06/03/2013 2039   CL 106 06/03/2013 2039   CO2 30 06/03/2013 2039   BUN 6 (L) 06/03/2013 2039   CREATININE 0.57 11/15/2014 1629   CREATININE 0.59 (L) 06/03/2013 2039      Component Value Date/Time   CALCIUM 8.8 06/03/2013 2039   ALKPHOS 134 (H) 06/03/2013 2039   AST 38 (H) 06/03/2013 2039   ALT 44 06/03/2013 2039   BILITOT 0.4 06/03/2013 2039       RADIOGRAPHIC STUDIES: I have personally reviewed the radiological images as listed and agreed with the findings in the report. No results found.   ASSESSMENT & PLAN:  Iron deficiency anemia due to chronic blood loss Iron deficiency anemia-question etiology. Check CBC on studies today. If low recommend IV iron. Patient has tolerated IV iron in the past well. Intolerant by mouth iron.   # History of endometrial cancer status post surgery; no adjuvant needed.  # Will call patient with the plan for IV iron based on her numbers.  # Otherwise patient follow-up with [place] 6 months with iron studies/CBC BMP.   Orders Placed This Encounter  Procedures  . CBC with Differential    Standing Status:   Future    Standing Expiration Date:   01/08/2017  . Ferritin    Standing Status:   Future    Standing Expiration Date:   01/08/2017  . Iron and TIBC     Standing Status:   Future    Standing Expiration Date:   01/08/2017   All questions were answered. The patient knows to call the clinic with any problems, questions or concerns.      Cammie Sickle, MD 01/10/2016 8:29 AM

## 2016-01-10 NOTE — Assessment & Plan Note (Addendum)
Iron deficiency anemia-question etiology. Check CBC on studies today. If low recommend IV iron. Patient has tolerated IV iron in the past well. Intolerant by mouth iron.   # History of endometrial cancer status post surgery; no adjuvant needed.  # Will call patient with the plan for IV iron based on her numbers.  # Otherwise patient follow-up with [place] 6 months with iron studies/CBC BMP.

## 2016-01-14 ENCOUNTER — Telehealth: Payer: Self-pay | Admitting: *Deleted

## 2016-01-14 NOTE — Telephone Encounter (Signed)
-----   Message from Cammie Sickle, MD sent at 01/11/2016  4:42 PM EST ----- Pt does not IV iron; okay to take iron PO once a day.

## 2016-01-14 NOTE — Telephone Encounter (Signed)
Spoke with patient. No IV iron needed.  Pt instructed to Continue to take oral iron otc.  Teach back process performed.

## 2016-01-17 ENCOUNTER — Ambulatory Visit (INDEPENDENT_AMBULATORY_CARE_PROVIDER_SITE_OTHER): Payer: Medicare Other | Admitting: Vascular Surgery

## 2016-01-17 ENCOUNTER — Encounter (INDEPENDENT_AMBULATORY_CARE_PROVIDER_SITE_OTHER): Payer: Self-pay | Admitting: Vascular Surgery

## 2016-01-17 VITALS — BP 135/73 | HR 71 | Resp 17 | Ht 64.0 in | Wt 260.0 lb

## 2016-01-17 DIAGNOSIS — I890 Lymphedema, not elsewhere classified: Secondary | ICD-10-CM | POA: Diagnosis not present

## 2016-01-17 DIAGNOSIS — G4733 Obstructive sleep apnea (adult) (pediatric): Secondary | ICD-10-CM

## 2016-01-17 DIAGNOSIS — M79604 Pain in right leg: Secondary | ICD-10-CM

## 2016-01-17 DIAGNOSIS — I872 Venous insufficiency (chronic) (peripheral): Secondary | ICD-10-CM | POA: Diagnosis not present

## 2016-01-17 DIAGNOSIS — I10 Essential (primary) hypertension: Secondary | ICD-10-CM

## 2016-01-17 DIAGNOSIS — M79605 Pain in left leg: Secondary | ICD-10-CM

## 2016-01-17 DIAGNOSIS — M79609 Pain in unspecified limb: Secondary | ICD-10-CM | POA: Insufficient documentation

## 2016-01-17 NOTE — Progress Notes (Signed)
MRN : [id number]  Christine Lawson is a 55 y.o. (1961-01-04) female who presents with chief complaint of  Chief Complaint  Patient presents with  . Re-evaluation    2 month follow up  .  History of Present Illness: The patient returns to the office for followup evaluation regarding leg swelling.  The swelling has persisted but with the lymph pump is much, much better controlled (when she uses the pump). The pain associated with swelling is essentially eliminated. There have not been any interval development of a ulcerations or wounds.    The patient denies problems with the pump, noting it is working well and the leggings are in good condition.  Since the previous visit the patient has not been wearing graduated compression stockings.  She states they "hurt too much to wear then".  Patient stated the lymph pump has been a very positive factor in her care.    Current Meds  Medication Sig  . albuterol (PROVENTIL HFA;VENTOLIN HFA) 108 (90 Base) MCG/ACT inhaler Inhale 1 puff into the lungs every 6 (six) hours as needed for wheezing or shortness of breath.  . allopurinol (ZYLOPRIM) 100 MG tablet Take by mouth.  Marland Kitchen amLODipine (NORVASC) 10 MG tablet Take 10 mg by mouth daily.  Marland Kitchen aspirin (ASPIRIN EC) 81 MG EC tablet Take 81 mg by mouth daily. Swallow whole.  . cholecalciferol (VITAMIN D) 1000 UNITS tablet Take 1,000 Units by mouth daily.  Marland Kitchen dicyclomine (BENTYL) 20 MG tablet Take 20 mg by mouth every 6 (six) hours.  . enalapril (VASOTEC) 2.5 MG tablet   . ferrous sulfate 325 (65 FE) MG tablet Take 325 mg by mouth daily after breakfast.  . insulin aspart (NOVOLOG) 100 UNIT/ML injection Inject 20 Units into the skin 3 (three) times daily before meals. 2o units in am, 26 units at lunch and 36 units at supper  . Insulin Glargine (LANTUS SOLOSTAR) 100 UNIT/ML Solostar Pen Inject 46 Units as directed at bedtime.  . insulin lispro (HUMALOG) 100 UNIT/ML KiwkPen 24 units with breakfast, 28 units with  lunch, 50 units with supper  . metFORMIN (GLUCOPHAGE-XR) 500 MG 24 hr tablet Take 1 tablet by mouth 2 (two) times daily.  Marland Kitchen omeprazole (PRILOSEC) 40 MG capsule Take 40 mg by mouth 2 (two) times daily.  . Probiotic Product (SOLUBLE FIBER/PROBIOTICS PO) Take 1 Dose by mouth 3 (three) times daily.  . solifenacin (VESICARE) 10 MG tablet Take by mouth daily.  . vitamin B-12 (CYANOCOBALAMIN) 500 MCG tablet Take 1 tablet by mouth daily.    Past Medical History:  Diagnosis Date  . Diabetes mellitus without complication ([place])   . Endometrial adenocarcinoma ([place])   . GERD (gastroesophageal reflux disease)   . Hematochezia   . Hyperlipidemia   . Hypertension   . IDA (iron deficiency anemia)   . Inguinal lymphadenopathy 07/13/2015  . Irritable bowel syndrome   . Obesity   . Osteoarthritis   . Sleep apnea     Past Surgical History:  Procedure Laterality Date  . ABDOMINAL HYSTERECTOMY  feb 2013  . COLONOSCOPY WITH PROPOFOL N/A 08/02/2015   Procedure: COLONOSCOPY WITH PROPOFOL;  Surgeon: Manya Silvas, MD;  Location: [facility] ENDOSCOPY;  Service: Endoscopy;  Laterality: N/A;  . ESOPHAGOGASTRODUODENOSCOPY (EGD) WITH PROPOFOL N/A 08/02/2015   Procedure: ESOPHAGOGASTRODUODENOSCOPY (EGD) WITH PROPOFOL;  Surgeon: Manya Silvas, MD;  Location: [facility] ENDOSCOPY;  Service: Endoscopy;  Laterality: N/A;    Social History Social History  Substance Use Topics  . Smoking status:  Never Smoker  . Smokeless tobacco: Never Used  . Alcohol use No    Family History Family History  Problem Relation Age of Onset  . Diabetes Mother   . Diabetes Father   No family history of bleeding/clotting disorders, porphyria or autoimmune disease   Allergies  Allergen Reactions  . Solifenacin Other (See Comments)  . Contrast Media [Iodinated Diagnostic Agents]   . Dicyclomine Other (See Comments)  . Isovue [Iopamidol] Other (See Comments)    Wheezing      REVIEW OF SYSTEMS (Negative unless  checked)  Constitutional: [] Weight loss  [] Fever  [] Chills Cardiac: [] Chest pain   [] Chest pressure   [] Palpitations   [] Shortness of breath when laying flat   [] Shortness of breath with exertion. Vascular:  [] Pain in legs with walking   [] Pain in legs at rest  [] History of DVT   [] Phlebitis   [] Swelling in legs   [] Varicose veins   [] Non-healing ulcers Pulmonary:   [] Uses home oxygen   [] Productive cough   [] Hemoptysis   [] Wheeze  [] COPD   [] Asthma Neurologic:  [] Dizziness   [] Seizures   [] History of stroke   [] History of TIA  [] Aphasia   [] Vissual changes   [] Weakness or numbness in arm   [] Weakness or numbness in leg Musculoskeletal:   [] Joint swelling   [] Joint pain   [] Low back pain Hematologic:  [] Easy bruising  [] Easy bleeding   [] Hypercoagulable state   [] Anemic Gastrointestinal:  [] Diarrhea   [] Vomiting  [] Gastroesophageal reflux/heartburn   [] Difficulty swallowing. Genitourinary:  [] Chronic kidney disease   [] Difficult urination  [] Frequent urination   [] Blood in urine Skin:  [] Rashes   [] Ulcers  Psychological:  [] History of anxiety   []  History of major depression.  Physical Examination  Vitals:   01/17/16 1317  BP: 135/73  Pulse: 71  Resp: 17  Weight: 260 lb (117.9 kg)  Height: 5\' 4"  (1.626 m)   Body mass index is 44.63 kg/m. Gen: WD/WN, NAD Head: /AT, No temporalis wasting.  Ear/Nose/Throat: Hearing grossly intact, nares w/o erythema or drainage, poor dentition Eyes: PER, EOMI, sclera nonicteric.  Neck: Supple, no masses.  No bruit or JVD.  Pulmonary:  Good air movement, clear to auscultation bilaterally, no use of accessory muscles.  Cardiac: RRR, normal S1, S2, no Murmurs. Vascular: 3-4+ edema with chronic skin changes  No ulcers Vessel Right Left  Radial Palpable Palpable  Ulnar Palpable Palpable  Brachial Palpable Palpable  Carotid Palpable Palpable  Femoral Palpable Palpable  Popliteal Palpable Palpable  PT Palpable Palpable  DP Palpable Palpable    Gastrointestinal: soft, non-distended. No guarding/no peritoneal signs.  Musculoskeletal: M/S 5/5 throughout.  No deformity or atrophy.  Neurologic: CN 2-12 intact. Pain and light touch intact in extremities.  Symmetrical.  Speech is fluent. Motor exam as listed above. Psychiatric: Judgment intact, Mood & affect appropriate for pt's clinical situation. Dermatologic: No rashes or ulcers noted.  No changes consistent with cellulitis. Lymph : No Cervical lymphadenopathy, no lichenification or skin changes of chronic lymphedema.  CBC Lab Results  Component Value Date   WBC 10.4 01/09/2016   HGB 12.2 01/09/2016   HCT 37.9 01/09/2016   MCV 79.7 (L) 01/09/2016   PLT 436 01/09/2016    BMET    Component Value Date/Time   NA 141 06/03/2013 2039   K 3.7 06/03/2013 2039   CL 106 06/03/2013 2039   CO2 30 06/03/2013 2039   GLUCOSE 59 (L) 06/03/2013 2039   BUN 6 (L) 06/03/2013 2039  CREATININE 0.57 11/15/2014 1629   CREATININE 0.59 (L) 06/03/2013 2039   CALCIUM 8.8 06/03/2013 2039   GFRNONAA >60 11/15/2014 1629   GFRNONAA >60 06/03/2013 2039   GFRAA >60 11/15/2014 1629   GFRAA >60 06/03/2013 2039   CrCl cannot be calculated (Patient's most recent lab result is older than the maximum 21 days allowed.).  COAG No results found for: INR, PROTIME  Radiology No results found.   Assessment/Plan 1. Lymphedema  No surgery or intervention at this point in time.    I have reviewed my discussion with the patient regarding lymphedema and why it  causes symptoms.  Patient will try wearing graduated compression wraps such as the ones from [employer] or Sigvaris (20-30 mmHg) on a daily basis a prescription was given. I demonstrated these two wraps to her.  The patient will  beginning wearing the wraps first thing in the morning and removing them in the evening. The patient is instructed specifically not to sleep in the stockings.   In addition, behavioral modification throughout the day will be  continued.  This will include frequent elevation (such as in a recliner), use of over the counter pain medications as needed and exercise such as walking.  I have reviewed systemic causes for chronic edema such as liver, kidney and cardiac etiologies and there does not appear to be any significant changes in these organ systems over the past year.  The patient is under the impression that these organ systems are all stable and unchanged.    The patient will continue aggressive use of the  lymph pump.  This will continue to improve the edema control and prevent sequela such as ulcers and infections.   The patient will follow-up with me on an annual basis.    2. Chronic venous insufficiency See # 1  3. Type 2 diabetes mellitus with diabetic polyneuropathy, with long-term current use of insulin ([place]) Continue hypoglycemic medications as ordered no changes today   Hortencia Pilar, MD  01/17/2016 2:54 PM

## 2016-01-30 ENCOUNTER — Other Ambulatory Visit
Admission: RE | Admit: 2016-01-30 | Discharge: 2016-01-30 | Disposition: A | Discharge status: Home / Self Care | Payer: Medicare Other | Source: Ambulatory Visit | Attending: Nurse Practitioner | Admitting: Nurse Practitioner

## 2016-01-30 DIAGNOSIS — R197 Diarrhea, unspecified: Secondary | ICD-10-CM | POA: Insufficient documentation

## 2016-01-30 LAB — GASTROINTESTINAL PANEL BY PCR, STOOL (REPLACES STOOL CULTURE)

## 2016-01-30 LAB — C DIFFICILE QUICK SCREEN W PCR REFLEX
C Diff antigen: NEGATIVE
C Diff interpretation: NOT DETECTED
C Diff toxin: NEGATIVE

## 2016-02-08 ENCOUNTER — Other Ambulatory Visit
Admission: RE | Admit: 2016-02-08 | Discharge: 2016-02-08 | Disposition: A | Discharge status: Home / Self Care | Payer: Medicare Other | Source: Ambulatory Visit | Attending: Nurse Practitioner | Admitting: Nurse Practitioner

## 2016-02-08 DIAGNOSIS — R197 Diarrhea, unspecified: Secondary | ICD-10-CM | POA: Diagnosis present

## 2016-02-12 LAB — MISC LABCORP TEST (SEND OUT): Labcorp test code: 123255

## 2016-02-27 ENCOUNTER — Ambulatory Visit: Payer: Medicare Other

## 2016-03-05 ENCOUNTER — Other Ambulatory Visit
Admission: RE | Admit: 2016-03-05 | Discharge: 2016-03-05 | Disposition: A | Discharge status: Home / Self Care | Payer: Medicare Other | Source: Ambulatory Visit | Attending: Nurse Practitioner | Admitting: Nurse Practitioner

## 2016-03-05 DIAGNOSIS — K529 Noninfective gastroenteritis and colitis, unspecified: Secondary | ICD-10-CM | POA: Insufficient documentation

## 2016-03-05 LAB — C DIFFICILE QUICK SCREEN W PCR REFLEX
C Diff antigen: NEGATIVE
C Diff interpretation: NOT DETECTED
C Diff toxin: NEGATIVE

## 2016-04-03 DIAGNOSIS — K580 Irritable bowel syndrome with diarrhea: Secondary | ICD-10-CM | POA: Insufficient documentation

## 2016-07-08 ENCOUNTER — Inpatient Hospital Stay: Payer: Medicare Other | Attending: Internal Medicine

## 2016-07-08 DIAGNOSIS — I10 Essential (primary) hypertension: Secondary | ICD-10-CM | POA: Insufficient documentation

## 2016-07-08 DIAGNOSIS — K219 Gastro-esophageal reflux disease without esophagitis: Secondary | ICD-10-CM | POA: Insufficient documentation

## 2016-07-08 DIAGNOSIS — M1990 Unspecified osteoarthritis, unspecified site: Secondary | ICD-10-CM | POA: Diagnosis not present

## 2016-07-08 DIAGNOSIS — D509 Iron deficiency anemia, unspecified: Secondary | ICD-10-CM | POA: Diagnosis present

## 2016-07-08 DIAGNOSIS — Z8542 Personal history of malignant neoplasm of other parts of uterus: Secondary | ICD-10-CM | POA: Insufficient documentation

## 2016-07-08 DIAGNOSIS — K589 Irritable bowel syndrome without diarrhea: Secondary | ICD-10-CM | POA: Insufficient documentation

## 2016-07-08 DIAGNOSIS — Z79899 Other long term (current) drug therapy: Secondary | ICD-10-CM | POA: Diagnosis not present

## 2016-07-08 DIAGNOSIS — Z7982 Long term (current) use of aspirin: Secondary | ICD-10-CM | POA: Diagnosis not present

## 2016-07-08 DIAGNOSIS — D500 Iron deficiency anemia secondary to blood loss (chronic): Secondary | ICD-10-CM

## 2016-07-08 DIAGNOSIS — E669 Obesity, unspecified: Secondary | ICD-10-CM | POA: Diagnosis not present

## 2016-07-08 DIAGNOSIS — G4730 Sleep apnea, unspecified: Secondary | ICD-10-CM | POA: Insufficient documentation

## 2016-07-08 DIAGNOSIS — E119 Type 2 diabetes mellitus without complications: Secondary | ICD-10-CM | POA: Diagnosis not present

## 2016-07-08 DIAGNOSIS — Z794 Long term (current) use of insulin: Secondary | ICD-10-CM | POA: Insufficient documentation

## 2016-07-08 LAB — CBC WITH DIFFERENTIAL/PLATELET
BASOS PCT: 1 %
Basophils Absolute: 0.1 10*3/uL (ref 0–0.1)
EOS ABS: 0.3 10*3/uL (ref 0–0.7)
EOS PCT: 3 %
HCT: 35 % (ref 35.0–47.0)
Hemoglobin: 11.6 g/dL — ABNORMAL LOW (ref 12.0–16.0)
Lymphocytes Relative: 18 %
Lymphs Abs: 2 10*3/uL (ref 1.0–3.6)
MCH: 25.9 pg — ABNORMAL LOW (ref 26.0–34.0)
MCHC: 33.2 g/dL (ref 32.0–36.0)
MCV: 77.9 fL — ABNORMAL LOW (ref 80.0–100.0)
MONO ABS: 0.8 10*3/uL (ref 0.2–0.9)
MONOS PCT: 7 %
NEUTROS PCT: 71 %
Neutro Abs: 7.9 10*3/uL — ABNORMAL HIGH (ref 1.4–6.5)
Platelets: 462 10*3/uL — ABNORMAL HIGH (ref 150–440)
RBC: 4.48 MIL/uL (ref 3.80–5.20)
RDW: 14.5 % (ref 11.5–14.5)
WBC: 11 10*3/uL (ref 3.6–11.0)

## 2016-07-08 LAB — IRON AND TIBC
IRON: 49 ug/dL (ref 28–170)
Saturation Ratios: 14 % (ref 10.4–31.8)
TIBC: 350 ug/dL (ref 250–450)
UIBC: 301 ug/dL

## 2016-07-08 LAB — FERRITIN: FERRITIN: 151 ng/mL (ref 11–307)

## 2016-07-16 ENCOUNTER — Inpatient Hospital Stay (HOSPITAL_BASED_OUTPATIENT_CLINIC_OR_DEPARTMENT_OTHER): Payer: Medicare Other | Admitting: Internal Medicine

## 2016-07-16 VITALS — BP 134/73 | HR 73 | Temp 97.6°F | Resp 20 | Ht 64.0 in | Wt 261.0 lb

## 2016-07-16 DIAGNOSIS — K219 Gastro-esophageal reflux disease without esophagitis: Secondary | ICD-10-CM | POA: Diagnosis not present

## 2016-07-16 DIAGNOSIS — D509 Iron deficiency anemia, unspecified: Secondary | ICD-10-CM

## 2016-07-16 DIAGNOSIS — E669 Obesity, unspecified: Secondary | ICD-10-CM | POA: Diagnosis not present

## 2016-07-16 DIAGNOSIS — K589 Irritable bowel syndrome without diarrhea: Secondary | ICD-10-CM

## 2016-07-16 DIAGNOSIS — Z7982 Long term (current) use of aspirin: Secondary | ICD-10-CM

## 2016-07-16 DIAGNOSIS — E119 Type 2 diabetes mellitus without complications: Secondary | ICD-10-CM | POA: Diagnosis not present

## 2016-07-16 DIAGNOSIS — G4730 Sleep apnea, unspecified: Secondary | ICD-10-CM | POA: Diagnosis not present

## 2016-07-16 DIAGNOSIS — M1990 Unspecified osteoarthritis, unspecified site: Secondary | ICD-10-CM | POA: Diagnosis not present

## 2016-07-16 DIAGNOSIS — Z8542 Personal history of malignant neoplasm of other parts of uterus: Secondary | ICD-10-CM

## 2016-07-16 DIAGNOSIS — I10 Essential (primary) hypertension: Secondary | ICD-10-CM

## 2016-07-16 DIAGNOSIS — Z79899 Other long term (current) drug therapy: Secondary | ICD-10-CM

## 2016-07-16 DIAGNOSIS — Z794 Long term (current) use of insulin: Secondary | ICD-10-CM | POA: Diagnosis not present

## 2016-07-16 DIAGNOSIS — C541 Malignant neoplasm of endometrium: Secondary | ICD-10-CM

## 2016-07-16 DIAGNOSIS — D500 Iron deficiency anemia secondary to blood loss (chronic): Secondary | ICD-10-CM

## 2016-07-16 NOTE — Assessment & Plan Note (Addendum)
Iron deficiency anemia-question etiology. Today 11.7; iron sat- 14%; ferritin 151. Patient denies any significant symptoms/reluctant to get IV iron. Continue by mouth iron. No IV iron today.  # History of endometrial cancer status post surgery-no adjuvant therapy follow up with gynecology oncology.  # Otherwise patient follow-up with [place] 3 months with iron studies/CBC BMP; few days prior; IV ferrahem.

## 2016-07-16 NOTE — Progress Notes (Signed)
Patient here for follow-up for endometrial cancer. She has no medical complaints.

## 2016-07-16 NOTE — Progress Notes (Signed)
[place] OFFICE PROGRESS NOTE  Patient Care Team: Tracie Harrier, MD as PCP - General (Internal Medicine)  Cancer Staging No matching staging information was found for the patient.     Endometrial adenocarcinoma ([place])   07/13/2015 Initial Diagnosis    Endometrial adenocarcinoma ([place])     Anemia of iron deficiency unresponsive to oral iron therapy - got IV Venofer 1 g in Feb 2015, then on oral Ferrous sulfate 1 tablet daily. (labs on 03/28/13 had showed hemoglobin 10.0, MCV 71.3, WBC 10,800, ANC 7990, ALC 1640, platelet count 564, ferritin 27, serum iron 33, iron saturation low at 7%, TIBC 452. In June 2014 - hemoglobin 9.8, WBC 10,700, platelets 551, LFT unremarkable except alkaline phos of 120, creatinine 0.6). Stool Hemoccult was negative x2 in November 2014.  Colonoscopy July 2013 had shown internal hemorrhoids.  INTERVAL HISTORY:  Christine Lawson 56 y.o.  female pleasant patient above history of iron deficiency anemia; also history of early stage endometrial cancer is here for follow-up.  Patient denies any worsening fatigue or shortness of breath.  Denies any new symptoms of blood in stools black colored stools. Denies any blood in urine. Patient takes 1 iron pill a day.  REVIEW OF SYSTEMS:  A complete 10 point review of system is done which is negative except mentioned above/history of present illness.   PAST MEDICAL HISTORY :  Past Medical History:  Diagnosis Date  . Diabetes mellitus without complication (Christine Lawson)   . Endometrial adenocarcinoma ([place])   . GERD (gastroesophageal reflux disease)   . Hematochezia   . Hyperlipidemia   . Hypertension   . IDA (iron deficiency anemia)   . Inguinal lymphadenopathy 07/13/2015  . Irritable bowel syndrome   . Obesity   . Osteoarthritis   . Sleep apnea     PAST SURGICAL HISTORY :   Past Surgical History:  Procedure Laterality Date  . ABDOMINAL HYSTERECTOMY  feb 2013  . COLONOSCOPY WITH PROPOFOL N/A 08/02/2015    Procedure: COLONOSCOPY WITH PROPOFOL;  Surgeon: Manya Silvas, MD;  Location: [facility] ENDOSCOPY;  Service: Endoscopy;  Laterality: N/A;  . ESOPHAGOGASTRODUODENOSCOPY (EGD) WITH PROPOFOL N/A 08/02/2015   Procedure: ESOPHAGOGASTRODUODENOSCOPY (EGD) WITH PROPOFOL;  Surgeon: Manya Silvas, MD;  Location: [facility] ENDOSCOPY;  Service: Endoscopy;  Laterality: N/A;    FAMILY HISTORY :   Family History  Problem Relation Age of Onset  . Diabetes Mother   . Diabetes Father     SOCIAL HISTORY:   Social History  Substance Use Topics  . Smoking status: Never Smoker  . Smokeless tobacco: Never Used  . Alcohol use No    ALLERGIES:  is allergic to solifenacin; contrast media [iodinated diagnostic agents]; dicyclomine; and isovue [iopamidol].  MEDICATIONS:  Current Outpatient Prescriptions  Medication Sig Dispense Refill  . albuterol (PROVENTIL HFA;VENTOLIN HFA) 108 (90 Base) MCG/ACT inhaler Inhale 1 puff into the lungs every 6 (six) hours as needed for wheezing or shortness of breath.    . allopurinol (ZYLOPRIM) 100 MG tablet Take by mouth.    Marland Kitchen amLODipine (NORVASC) 10 MG tablet Take 10 mg by mouth daily.    Marland Kitchen aspirin (ASPIRIN EC) 81 MG EC tablet Take 81 mg by mouth daily. Swallow whole.    . cholecalciferol (VITAMIN D) 1000 UNITS tablet Take 1,000 Units by mouth daily.    . enalapril (VASOTEC) 2.5 MG tablet Take 2.5 mg by mouth daily.     . ferrous sulfate 325 (65 FE) MG tablet Take 325 mg by mouth  daily after breakfast.    . insulin aspart (NOVOLOG) 100 UNIT/ML injection Inject 20 Units into the skin 3 (three) times daily before meals. 24 units in am, 26 units at lunch and 54 units at supper    . Insulin Glargine (LANTUS SOLOSTAR) 100 UNIT/ML Solostar Pen Inject 48 Units as directed at bedtime.     Marland Kitchen Lifitegrast (XIIDRA) 5 % SOLN Apply 1 drop to eye as needed for dry eyes.    . meloxicam (MOBIC) 7.5 MG tablet Take 1 tablet by mouth daily.    . metFORMIN (GLUCOPHAGE-XR) 500 MG 24 hr tablet Take  1 tablet by mouth 2 (two) times daily.    Marland Kitchen omeprazole (PRILOSEC) 40 MG capsule Take 40 mg by mouth daily.     . vitamin B-12 (CYANOCOBALAMIN) 500 MCG tablet Take 1 tablet by mouth daily.    . Probiotic Product (SOLUBLE FIBER/PROBIOTICS PO) Take 1 Dose by mouth 3 (three) times daily.     No current facility-administered medications for this visit.     PHYSICAL EXAMINATION: ECOG PERFORMANCE STATUS: 0 - Asymptomatic  BP 134/73 (BP Location: Left Arm, Patient Position: Sitting)   Pulse 73   Temp 97.6 F (36.4 C) (Tympanic)   Resp 20   Ht 5\' 4"  (1.626 m)   Wt 261 lb (118.4 kg)   BMI 44.80 kg/m   Filed Weights   07/16/16 1459  Weight: 261 lb (118.4 kg)    GENERAL: Well-nourished well-developed; Alert, no distress and comfortable.   Accompanied by her twin sister.  EYES: no pallor or icterus OROPHARYNX: no thrush or ulceration; good dentition  NECK: supple, no masses felt LYMPH:  no palpable lymphadenopathy in the cervical, axillary or inguinal regions LUNGS: clear to auscultation and  No wheeze or crackles HEART/CVS: regular rate & rhythm and no murmurs; No lower extremity edema ABDOMEN:abdomen soft, non-tender and normal bowel sounds Musculoskeletal:no cyanosis of digits and no clubbing  PSYCH: alert & oriented x 3 with fluent speech NEURO: no focal motor/sensory deficits SKIN:  no rashes or significant lesions  LABORATORY DATA:  I have reviewed the data as listed    Component Value Date/Time   NA 141 06/03/2013 2039   K 3.7 06/03/2013 2039   CL 106 06/03/2013 2039   CO2 30 06/03/2013 2039   GLUCOSE 59 (L) 06/03/2013 2039   BUN 6 (L) 06/03/2013 2039   CREATININE 0.57 11/15/2014 1629   CREATININE 0.59 (L) 06/03/2013 2039   CALCIUM 8.8 06/03/2013 2039   PROT 8.8 (H) 06/03/2013 2039   ALBUMIN 3.6 06/03/2013 2039   AST 38 (H) 06/03/2013 2039   ALT 44 06/03/2013 2039   ALKPHOS 134 (H) 06/03/2013 2039   BILITOT 0.4 06/03/2013 2039   GFRNONAA >60 11/15/2014 1629    GFRNONAA >60 06/03/2013 2039   GFRAA >60 11/15/2014 1629   GFRAA >60 06/03/2013 2039    No results found for: SPEP, UPEP  Lab Results  Component Value Date   WBC 11.0 07/08/2016   NEUTROABS 7.9 (H) 07/08/2016   HGB 11.6 (L) 07/08/2016   HCT 35.0 07/08/2016   MCV 77.9 (L) 07/08/2016   PLT 462 (H) 07/08/2016      Chemistry      Component Value Date/Time   NA 141 06/03/2013 2039   K 3.7 06/03/2013 2039   CL 106 06/03/2013 2039   CO2 30 06/03/2013 2039   BUN 6 (L) 06/03/2013 2039   CREATININE 0.57 11/15/2014 1629   CREATININE 0.59 (L) 06/03/2013 2039  Component Value Date/Time   CALCIUM 8.8 06/03/2013 2039   ALKPHOS 134 (H) 06/03/2013 2039   AST 38 (H) 06/03/2013 2039   ALT 44 06/03/2013 2039   BILITOT 0.4 06/03/2013 2039       RADIOGRAPHIC STUDIES: I have personally reviewed the radiological images as listed and agreed with the findings in the report. No results found.   ASSESSMENT & PLAN:  Iron deficiency anemia due to chronic blood loss Iron deficiency anemia-question etiology. Today 11.7; iron sat- 14%; ferritin 151. Patient denies any significant symptoms/reluctant to get IV iron. Continue by mouth iron. No IV iron today.  # History of endometrial cancer status post surgery-no adjuvant therapy follow up with gynecology oncology.  # Otherwise patient follow-up with [place] 3 months with iron studies/CBC BMP; few days prior; IV ferrahem.    Orders Placed This Encounter  Procedures  . Basic metabolic panel    Standing Status:   Future    Standing Expiration Date:   07/16/2017  . CBC with Differential/Platelet    Standing Status:   Future    Standing Expiration Date:   07/16/2017  . Ferritin    Standing Status:   Future    Standing Expiration Date:   07/16/2017  . Iron and TIBC    Standing Status:   Future    Standing Expiration Date:   07/16/2017   All questions were answered. The patient knows to call the clinic with any problems, questions or concerns.       Cammie Sickle, MD 07/20/2016 6:43 PM

## 2016-09-26 ENCOUNTER — Other Ambulatory Visit: Payer: Self-pay | Admitting: Internal Medicine

## 2016-09-26 DIAGNOSIS — Z1231 Encounter for screening mammogram for malignant neoplasm of breast: Secondary | ICD-10-CM

## 2016-10-13 ENCOUNTER — Inpatient Hospital Stay: Payer: Medicare Other | Attending: Internal Medicine

## 2016-10-13 DIAGNOSIS — K219 Gastro-esophageal reflux disease without esophagitis: Secondary | ICD-10-CM | POA: Diagnosis not present

## 2016-10-13 DIAGNOSIS — Z794 Long term (current) use of insulin: Secondary | ICD-10-CM | POA: Diagnosis not present

## 2016-10-13 DIAGNOSIS — G4730 Sleep apnea, unspecified: Secondary | ICD-10-CM | POA: Insufficient documentation

## 2016-10-13 DIAGNOSIS — I10 Essential (primary) hypertension: Secondary | ICD-10-CM | POA: Diagnosis not present

## 2016-10-13 DIAGNOSIS — E669 Obesity, unspecified: Secondary | ICD-10-CM | POA: Insufficient documentation

## 2016-10-13 DIAGNOSIS — E119 Type 2 diabetes mellitus without complications: Secondary | ICD-10-CM | POA: Diagnosis not present

## 2016-10-13 DIAGNOSIS — D500 Iron deficiency anemia secondary to blood loss (chronic): Secondary | ICD-10-CM | POA: Diagnosis not present

## 2016-10-13 DIAGNOSIS — C541 Malignant neoplasm of endometrium: Secondary | ICD-10-CM

## 2016-10-13 DIAGNOSIS — M1990 Unspecified osteoarthritis, unspecified site: Secondary | ICD-10-CM | POA: Diagnosis not present

## 2016-10-13 DIAGNOSIS — Z8542 Personal history of malignant neoplasm of other parts of uterus: Secondary | ICD-10-CM | POA: Insufficient documentation

## 2016-10-13 DIAGNOSIS — Z7982 Long term (current) use of aspirin: Secondary | ICD-10-CM | POA: Insufficient documentation

## 2016-10-13 DIAGNOSIS — E785 Hyperlipidemia, unspecified: Secondary | ICD-10-CM | POA: Diagnosis not present

## 2016-10-13 DIAGNOSIS — K589 Irritable bowel syndrome without diarrhea: Secondary | ICD-10-CM | POA: Insufficient documentation

## 2016-10-13 DIAGNOSIS — Z79899 Other long term (current) drug therapy: Secondary | ICD-10-CM | POA: Insufficient documentation

## 2016-10-13 LAB — BASIC METABOLIC PANEL
ANION GAP: 10 (ref 5–15)
BUN: 9 mg/dL (ref 6–20)
CHLORIDE: 99 mmol/L — AB (ref 101–111)
CO2: 27 mmol/L (ref 22–32)
Calcium: 8.8 mg/dL — ABNORMAL LOW (ref 8.9–10.3)
Creatinine, Ser: 0.54 mg/dL (ref 0.44–1.00)
GFR calc non Af Amer: >60 mL/min (ref >60)
Glucose, Bld: 265 mg/dL — ABNORMAL HIGH (ref 65–99)
Potassium: 3.9 mmol/L (ref 3.5–5.1)
SODIUM: 136 mmol/L (ref 135–145)

## 2016-10-13 LAB — CBC WITH DIFFERENTIAL/PLATELET
BASOS PCT: 1 %
Basophils Absolute: 0.1 10*3/uL (ref 0–0.1)
EOS ABS: 0.3 10*3/uL (ref 0–0.7)
Eosinophils Relative: 3 %
HEMATOCRIT: 36.9 % (ref 35.0–47.0)
HEMOGLOBIN: 12.4 g/dL (ref 12.0–16.0)
LYMPHS ABS: 1.7 10*3/uL (ref 1.0–3.6)
Lymphocytes Relative: 17 %
MCH: 26.3 pg (ref 26.0–34.0)
MCHC: 33.5 g/dL (ref 32.0–36.0)
MCV: 78.4 fL — ABNORMAL LOW (ref 80.0–100.0)
Monocytes Absolute: 0.6 10*3/uL (ref 0.2–0.9)
Monocytes Relative: 6 %
NEUTROS ABS: 7.2 10*3/uL — AB (ref 1.4–6.5)
NEUTROS PCT: 73 %
Platelets: 430 10*3/uL (ref 150–440)
RBC: 4.71 MIL/uL (ref 3.80–5.20)
RDW: 14.5 % (ref 11.5–14.5)
WBC: 9.9 10*3/uL (ref 3.6–11.0)

## 2016-10-13 LAB — FERRITIN: FERRITIN: 169 ng/mL (ref 11–307)

## 2016-10-14 ENCOUNTER — Ambulatory Visit
Admission: RE | Admit: 2016-10-14 | Discharge: 2016-10-14 | Disposition: A | Discharge status: Home / Self Care | Payer: Medicare Other | Source: Ambulatory Visit | Attending: Internal Medicine | Admitting: Internal Medicine

## 2016-10-14 DIAGNOSIS — Z1231 Encounter for screening mammogram for malignant neoplasm of breast: Secondary | ICD-10-CM | POA: Diagnosis not present

## 2016-10-15 ENCOUNTER — Inpatient Hospital Stay: Payer: Medicare Other

## 2016-10-15 ENCOUNTER — Inpatient Hospital Stay (HOSPITAL_BASED_OUTPATIENT_CLINIC_OR_DEPARTMENT_OTHER): Payer: Medicare Other | Admitting: Internal Medicine

## 2016-10-15 VITALS — BP 116/66 | HR 86 | Temp 97.6°F | Resp 20

## 2016-10-15 DIAGNOSIS — E119 Type 2 diabetes mellitus without complications: Secondary | ICD-10-CM | POA: Diagnosis not present

## 2016-10-15 DIAGNOSIS — Z794 Long term (current) use of insulin: Secondary | ICD-10-CM | POA: Diagnosis not present

## 2016-10-15 DIAGNOSIS — K219 Gastro-esophageal reflux disease without esophagitis: Secondary | ICD-10-CM | POA: Diagnosis not present

## 2016-10-15 DIAGNOSIS — E785 Hyperlipidemia, unspecified: Secondary | ICD-10-CM

## 2016-10-15 DIAGNOSIS — G4730 Sleep apnea, unspecified: Secondary | ICD-10-CM | POA: Diagnosis not present

## 2016-10-15 DIAGNOSIS — I10 Essential (primary) hypertension: Secondary | ICD-10-CM | POA: Diagnosis not present

## 2016-10-15 DIAGNOSIS — E669 Obesity, unspecified: Secondary | ICD-10-CM

## 2016-10-15 DIAGNOSIS — K589 Irritable bowel syndrome without diarrhea: Secondary | ICD-10-CM

## 2016-10-15 DIAGNOSIS — Z7982 Long term (current) use of aspirin: Secondary | ICD-10-CM | POA: Diagnosis not present

## 2016-10-15 DIAGNOSIS — M1990 Unspecified osteoarthritis, unspecified site: Secondary | ICD-10-CM | POA: Diagnosis not present

## 2016-10-15 DIAGNOSIS — Z8542 Personal history of malignant neoplasm of other parts of uterus: Secondary | ICD-10-CM | POA: Diagnosis not present

## 2016-10-15 DIAGNOSIS — D500 Iron deficiency anemia secondary to blood loss (chronic): Secondary | ICD-10-CM | POA: Diagnosis not present

## 2016-10-15 DIAGNOSIS — C541 Malignant neoplasm of endometrium: Secondary | ICD-10-CM

## 2016-10-15 DIAGNOSIS — Z79899 Other long term (current) drug therapy: Secondary | ICD-10-CM

## 2016-10-15 LAB — IRON AND TIBC
IRON: 68 ug/dL (ref 28–170)
Saturation Ratios: 20 % (ref 10.4–31.8)
TIBC: 335 ug/dL (ref 250–450)
UIBC: 267 ug/dL

## 2016-10-15 NOTE — Progress Notes (Signed)
[place] OFFICE PROGRESS NOTE  Patient Care Team: Tracie Harrier, MD as PCP - General (Internal Medicine)  Cancer Staging No matching staging information was found for the patient.     Endometrial adenocarcinoma ([place])   07/13/2015 Initial Diagnosis    Endometrial adenocarcinoma ([place])     Anemia of iron deficiency unresponsive to oral iron therapy - got IV Venofer 1 g in Feb 2015, then on oral Ferrous sulfate 1 tablet daily. (labs on 03/28/13 had showed hemoglobin 10.0, MCV 71.3, WBC 10,800, ANC 7990, ALC 1640, platelet count 564, ferritin 27, serum iron 33, iron saturation low at 7%, TIBC 452. In June 2014 - hemoglobin 9.8, WBC 10,700, platelets 551, LFT unremarkable except alkaline phos of 120, creatinine 0.6). Stool Hemoccult was negative x2 in November 2014.  Colonoscopy July 2013 had shown internal hemorrhoids.  INTERVAL HISTORY:  Christine Lawson 56 y.o.  female pleasant patient above history of iron deficiency anemia; also history of early stage endometrial cancer is here for follow-up.  Patient denies any worsening fatigue or shortness of breath.  Denies any new symptoms of blood in stools black colored stools. Denies any blood in urine. Patient takes 1 iron pill BID.   REVIEW OF SYSTEMS:  A complete 10 point review of system is done which is negative except mentioned above/history of present illness.   PAST MEDICAL HISTORY :  Past Medical History:  Diagnosis Date  . Diabetes mellitus without complication ([place])   . Endometrial adenocarcinoma ([place])   . GERD (gastroesophageal reflux disease)   . Hematochezia   . Hyperlipidemia   . Hypertension   . IDA (iron deficiency anemia)   . Inguinal lymphadenopathy 07/13/2015  . Irritable bowel syndrome   . Obesity   . Osteoarthritis   . Sleep apnea     PAST SURGICAL HISTORY :   Past Surgical History:  Procedure Laterality Date  . ABDOMINAL HYSTERECTOMY  feb 2013  . COLONOSCOPY WITH PROPOFOL N/A 08/02/2015    Procedure: COLONOSCOPY WITH PROPOFOL;  Surgeon: Manya Silvas, MD;  Location: [facility] ENDOSCOPY;  Service: Endoscopy;  Laterality: N/A;  . ESOPHAGOGASTRODUODENOSCOPY (EGD) WITH PROPOFOL N/A 08/02/2015   Procedure: ESOPHAGOGASTRODUODENOSCOPY (EGD) WITH PROPOFOL;  Surgeon: Manya Silvas, MD;  Location[facility] ENDOSCOPY;  Service: Endoscopy;  Laterality: N/A;    FAMILY HISTORY :   Family History  Problem Relation Age of Onset  . Diabetes Mother   . Diabetes Father     SOCIAL HISTORY:   Social History  Substance Use Topics  . Smoking status: Never Smoker  . Smokeless tobacco: Never Used  . Alcohol use No    ALLERGIES:  is allergic to solifenacin; contrast media [iodinated diagnostic agents]; dicyclomine; and isovue [iopamidol].  MEDICATIONS:  Current Outpatient Prescriptions  Medication Sig Dispense Refill  . albuterol (PROVENTIL HFA;VENTOLIN HFA) 108 (90 Base) MCG/ACT inhaler Inhale 1 puff into the lungs every 6 (six) hours as needed for wheezing or shortness of breath.    . allopurinol (ZYLOPRIM) 100 MG tablet Take 100 mg by mouth daily.     Marland Kitchen amLODipine (NORVASC) 10 MG tablet Take 10 mg by mouth daily.    Marland Kitchen aspirin (ASPIRIN EC) 81 MG EC tablet Take 81 mg by mouth daily. Swallow whole.    . cholecalciferol (VITAMIN D) 1000 UNITS tablet Take 1,000 Units by mouth daily.    . enalapril (VASOTEC) 2.5 MG tablet Take 2.5 mg by mouth daily.     . ferrous sulfate 325 (65 FE) MG tablet Take  325 mg by mouth 2 (two) times daily with a meal.     . insulin aspart (NOVOLOG) 100 UNIT/ML injection Inject 20 Units into the skin 3 (three) times daily before meals. 24 units in am, 22 units at lunch and 58 units at supper    . Insulin Glargine (LANTUS SOLOSTAR) 100 UNIT/ML Solostar Pen Inject 50 Units as directed at bedtime.     Marland Kitchen Lifitegrast (XIIDRA) 5 % SOLN Apply 1 drop to eye as needed for dry eyes.    . metFORMIN (GLUCOPHAGE-XR) 500 MG 24 hr tablet Take 1 tablet by mouth 2 (two) times daily.     Marland Kitchen omeprazole (PRILOSEC) 40 MG capsule Take 40 mg by mouth daily.     . vitamin B-12 (CYANOCOBALAMIN) 500 MCG tablet Take 1 tablet by mouth daily.    . diclofenac sodium (VOLTAREN) 1 % GEL Apply 1 application topically 2 (two) times daily as needed for pain.     No current facility-administered medications for this visit.     PHYSICAL EXAMINATION: ECOG PERFORMANCE STATUS: 0 - Asymptomatic  BP 116/66   Pulse 86   Temp 97.6 F (36.4 C) (Tympanic)   Resp 20   There were no vitals filed for this visit.  GENERAL: Well-nourished well-developed; Alert, no distress and comfortable.   Accompanied by her twin sister.  EYES: no pallor or icterus OROPHARYNX: no thrush or ulceration; good dentition  NECK: supple, no masses felt LYMPH:  no palpable lymphadenopathy in the cervical, axillary or inguinal regions LUNGS: clear to auscultation and  No wheeze or crackles HEART/CVS: regular rate & rhythm and no murmurs; No lower extremity edema ABDOMEN:abdomen soft, non-tender and normal bowel sounds Musculoskeletal:no cyanosis of digits and no clubbing  PSYCH: alert & oriented x 3 with fluent speech NEURO: no focal motor/sensory deficits SKIN:  no rashes or significant lesions  LABORATORY DATA:  I have reviewed the data as listed    Component Value Date/Time   NA 136 10/13/2016 1107   NA 141 06/03/2013 2039   K 3.9 10/13/2016 1107   K 3.7 06/03/2013 2039   CL 99 (L) 10/13/2016 1107   CL 106 06/03/2013 2039   CO2 27 10/13/2016 1107   CO2 30 06/03/2013 2039   GLUCOSE 265 (H) 10/13/2016 1107   GLUCOSE 59 (L) 06/03/2013 2039   BUN 9 10/13/2016 1107   BUN 6 (L) 06/03/2013 2039   CREATININE 0.54 10/13/2016 1107   CREATININE 0.59 (L) 06/03/2013 2039   CALCIUM 8.8 (L) 10/13/2016 1107   CALCIUM 8.8 06/03/2013 2039   PROT 8.8 (H) 06/03/2013 2039   ALBUMIN 3.6 06/03/2013 2039   AST 38 (H) 06/03/2013 2039   ALT 44 06/03/2013 2039   ALKPHOS 134 (H) 06/03/2013 2039   BILITOT 0.4 06/03/2013 2039    GFRNONAA >60 10/13/2016 1107   GFRNONAA >60 06/03/2013 2039   GFRAA >60 10/13/2016 1107   GFRAA >60 06/03/2013 2039    No results found for: SPEP, UPEP  Lab Results  Component Value Date   WBC 9.9 10/13/2016   NEUTROABS 7.2 (H) 10/13/2016   HGB 12.4 10/13/2016   HCT 36.9 10/13/2016   MCV 78.4 (L) 10/13/2016   PLT 430 10/13/2016      Chemistry      Component Value Date/Time   NA 136 10/13/2016 1107   NA 141 06/03/2013 2039   K 3.9 10/13/2016 1107   K 3.7 06/03/2013 2039   CL 99 (L) 10/13/2016 1107   CL 106 06/03/2013 2039  CO2 27 10/13/2016 1107   CO2 30 06/03/2013 2039   BUN 9 10/13/2016 1107   BUN 6 (L) 06/03/2013 2039   CREATININE 0.54 10/13/2016 1107   CREATININE 0.59 (L) 06/03/2013 2039      Component Value Date/Time   CALCIUM 8.8 (L) 10/13/2016 1107   CALCIUM 8.8 06/03/2013 2039   ALKPHOS 134 (H) 06/03/2013 2039   AST 38 (H) 06/03/2013 2039   ALT 44 06/03/2013 2039   BILITOT 0.4 06/03/2013 2039       RADIOGRAPHIC STUDIES: I have personally reviewed the radiological images as listed and agreed with the findings in the report. No results found.   ASSESSMENT & PLAN:  Iron deficiency anemia due to chronic blood loss Iron deficiency anemia-question etiology. Today 12.3;ferritin- 169. Patient denies any significant symptoms/reluctant to get IV iron. Continue by mouth iron PO BID.  No IV iron today.  # History of endometrial cancer status post surgery-no adjuvant therapy follow up with gynecology oncology.  # Patient follow-up with [place] 6 months with iron studies/CBC BMP; few days prior; IV ferrahem.    Orders Placed This Encounter  Procedures  . CBC with Differential/Platelet    Standing Status:   Future    Standing Expiration Date:   10/15/2017  . Comprehensive metabolic panel    Standing Status:   Future    Standing Expiration Date:   10/15/2017  . Iron and TIBC    Standing Status:   Future    Standing Expiration Date:   10/15/2017  . Ferritin     Standing Status:   Future    Standing Expiration Date:   10/15/2017   All questions were answered. The patient knows to call the clinic with any problems, questions or concerns.      Cammie Sickle, MD 10/17/2016 1:15 PM

## 2016-10-15 NOTE — Assessment & Plan Note (Addendum)
Iron deficiency anemia-question etiology. Today 12.3;ferritin- 169. Patient denies any significant symptoms/reluctant to get IV iron. Continue by mouth iron PO BID.  No IV iron today.  # History of endometrial cancer status post surgery-no adjuvant therapy follow up with gynecology oncology.  # Patient follow-up with [place] 6 months with iron studies/CBC BMP; few days prior; IV ferrahem.

## 2016-11-12 ENCOUNTER — Ambulatory Visit: Payer: Medicare Other

## 2016-11-19 ENCOUNTER — Inpatient Hospital Stay: Payer: Medicare Other | Attending: Obstetrics and Gynecology | Admitting: Obstetrics and Gynecology

## 2016-11-19 VITALS — BP 176/76 | HR 96 | Temp 98.4°F | Resp 18 | Ht 64.0 in | Wt 259.0 lb

## 2016-11-19 DIAGNOSIS — I890 Lymphedema, not elsewhere classified: Secondary | ICD-10-CM | POA: Diagnosis not present

## 2016-11-19 DIAGNOSIS — Z7982 Long term (current) use of aspirin: Secondary | ICD-10-CM | POA: Insufficient documentation

## 2016-11-19 DIAGNOSIS — Z794 Long term (current) use of insulin: Secondary | ICD-10-CM | POA: Diagnosis not present

## 2016-11-19 DIAGNOSIS — E119 Type 2 diabetes mellitus without complications: Secondary | ICD-10-CM

## 2016-11-19 DIAGNOSIS — Z90722 Acquired absence of ovaries, bilateral: Secondary | ICD-10-CM | POA: Insufficient documentation

## 2016-11-19 DIAGNOSIS — K219 Gastro-esophageal reflux disease without esophagitis: Secondary | ICD-10-CM | POA: Diagnosis not present

## 2016-11-19 DIAGNOSIS — M1990 Unspecified osteoarthritis, unspecified site: Secondary | ICD-10-CM | POA: Insufficient documentation

## 2016-11-19 DIAGNOSIS — Z8542 Personal history of malignant neoplasm of other parts of uterus: Secondary | ICD-10-CM | POA: Insufficient documentation

## 2016-11-19 DIAGNOSIS — E785 Hyperlipidemia, unspecified: Secondary | ICD-10-CM | POA: Insufficient documentation

## 2016-11-19 DIAGNOSIS — R1084 Generalized abdominal pain: Secondary | ICD-10-CM

## 2016-11-19 DIAGNOSIS — I10 Essential (primary) hypertension: Secondary | ICD-10-CM

## 2016-11-19 DIAGNOSIS — K760 Fatty (change of) liver, not elsewhere classified: Secondary | ICD-10-CM | POA: Insufficient documentation

## 2016-11-19 DIAGNOSIS — D500 Iron deficiency anemia secondary to blood loss (chronic): Secondary | ICD-10-CM | POA: Diagnosis not present

## 2016-11-19 DIAGNOSIS — E669 Obesity, unspecified: Secondary | ICD-10-CM

## 2016-11-19 DIAGNOSIS — K580 Irritable bowel syndrome with diarrhea: Secondary | ICD-10-CM | POA: Diagnosis not present

## 2016-11-19 DIAGNOSIS — E041 Nontoxic single thyroid nodule: Secondary | ICD-10-CM | POA: Diagnosis not present

## 2016-11-19 DIAGNOSIS — G4730 Sleep apnea, unspecified: Secondary | ICD-10-CM | POA: Diagnosis not present

## 2016-11-19 DIAGNOSIS — R590 Localized enlarged lymph nodes: Secondary | ICD-10-CM | POA: Insufficient documentation

## 2016-11-19 DIAGNOSIS — C541 Malignant neoplasm of endometrium: Secondary | ICD-10-CM

## 2016-11-19 DIAGNOSIS — R2242 Localized swelling, mass and lump, left lower limb: Secondary | ICD-10-CM | POA: Diagnosis not present

## 2016-11-19 DIAGNOSIS — Z7951 Long term (current) use of inhaled steroids: Secondary | ICD-10-CM | POA: Insufficient documentation

## 2016-11-19 DIAGNOSIS — Z6841 Body Mass Index (BMI) 40.0 and over, adult: Secondary | ICD-10-CM

## 2016-11-19 DIAGNOSIS — Z79899 Other long term (current) drug therapy: Secondary | ICD-10-CM

## 2016-11-19 DIAGNOSIS — I872 Venous insufficiency (chronic) (peripheral): Secondary | ICD-10-CM | POA: Diagnosis not present

## 2016-11-19 DIAGNOSIS — R109 Unspecified abdominal pain: Secondary | ICD-10-CM | POA: Insufficient documentation

## 2016-11-19 DIAGNOSIS — Z90710 Acquired absence of both cervix and uterus: Secondary | ICD-10-CM | POA: Diagnosis not present

## 2016-11-19 NOTE — Patient Instructions (Signed)
Pt to make appt with [facility] GYN in 1 year from today

## 2016-11-19 NOTE — Progress Notes (Signed)
Gynecologic Oncology Consult Visit   Referring Provider: Dr. Ouida Sills  Chief Concern: Continued surveillance for endometrial cancer  Subjective:  Christine Lawson is a 56 y.o. female who is seen for continued surveillance for endometrial cancer.   She presents today with complaints of abdominal pain - she has had this chronically and it comes and goes but she feels that this is worse than usual. No complaints of vaginal bleeding.   She forgot to see Dr. Ouida Sills this past year for surveillance.   At her last visit she was noted to have an abnormal skin lesion on her thigh. I have recommended removal and to see either her PCP or dermatologist and to contact them today. She states that they did not think further evaluation needed.   Gynecologic Oncology History  Christine Lawson is a pleasant female who is seen in consultation from Dr. Ouida Sills 04/2011     endometroid adenocarcinoma, grade 1-2, stage Ia (invasion 4 of 52mm, no LVSI),                TAHBSO and staging NED since surgery.  In 2016 she was complaining of constant complaints of abdominal pain, bloating and distention. She is also noted decreased appetite and diarrhea. She is seen by Dr. Tiffany Kocher in gastroenterology and has been diagnosed with possible IBS per her and her sister's report. To evaluate for recurrence we ordered a CT scan.  CT scan on 11/17/2014 IMPRESSION: 1. Nonobstructive gas pattern with no findings to account for patient's symptoms.  2. Bilateral inguinal adenopathy,most prominent in the upper right inguinal/inferior right pelvic region. All nodes are similar except for one new node on the right measuring 27 x 49mm. Metastatic disease not excluded.  3. Stable fluid collection left pelvis likely a postoperative fluid Collection.  She was supposed to have a repeat scan in January but she missed that appointment.   CT 07/12/2015    ADDENDUM REPORT: 07/13/2015 14:57 ADDENDUM: Revision to  body and impression. Additional history: Patient is reportedly post BILATERAL oophorectomy. The low-attenuation suspected cystic structures in the pelvis bilaterally, LEFT 4.0 x 3.1 x 4.8 cm and RIGHT 3.4 x 1.8 x 2.9 cm, therefore do not represent ovarian cysts. Differential diagnosis then would include chronic seromas, postoperative lymphoceles, and cystic/necrotic lymph nodes. These could be characterized by MR imaging. However, in light of BILATERAL inguinal adenopathy also identified, these could be evaluated concurrently by PET-CT imaging at the time of assessment of the inguinal adenopathy.     PET/CT 07/25/2015 ABDOMEN/PELVIS:Inguinal lymph nodes measure up to 1.7 cm on the right (CT image 233) with an SUV max of 2.9. Lymph nodes are stable in size from 11/17/2014. No abnormal hypermetabolism in the liver, adrenal glands, spleen or pancreas. Liver is decreased in attenuation diffusely. Gallbladder, adrenal glands, kidneys, spleen, pancreas, stomach and bowel are unremarkable with exception of a tiny hiatal hernia. Hysterectomy and bilateral salpingo-oophorectomy. Low-attenuation lesions in the adnexal regions measure up to 4.2 cm on the left and show no abnormal hypermetabolism. Postoperative changes along the midline ventral abdominal pelvic wall with scattered fat herniation.  IMPRESSION: 1. Borderline hypermetabolic inguinal lymph nodes are unchanged in size from 11/17/2014, arguing against metastatic disease. 2. Low-attenuation lesions in the adnexal regions are not hypermetabolic, favoring postoperative seromas. 3. Hepatic steatosis.      Problem List: Patient Active Problem List   Diagnosis Date Noted  . Lymphedema 01/17/2016  . Venous (peripheral) insufficiency 01/17/2016  . Pain in limb 01/17/2016  . Iron deficiency anemia due  to chronic blood loss 01/09/2016  . History of endometrial cancer 08/01/2015  . Endometrial adenocarcinoma ([place]) 07/13/2015     Class: History of  . Inguinal lymphadenopathy 07/13/2015  . Long term current use of insulin ([place]) 12/12/2013  . Microalbuminuria 12/12/2013  . Diabetes ([place]) 07/28/2013  . BP (high blood pressure) 07/28/2013  . HLD (hyperlipidemia) 07/28/2013  . Adiposity 07/28/2013  . Obstructive apnea 07/28/2013  . Arthritis, degenerative 07/28/2013  . Apnea, sleep 07/28/2013  . Thyroid nodule 07/28/2013    Past Medical History: Past Medical History:  Diagnosis Date  . Diabetes mellitus without complication ([place])   . Endometrial adenocarcinoma ([place])   . GERD (gastroesophageal reflux disease)   . Hematochezia   . Hyperlipidemia   . Hypertension   . IDA (iron deficiency anemia)   . Inguinal lymphadenopathy 07/13/2015  . Irritable bowel syndrome   . Obesity   . Osteoarthritis   . Sleep apnea     Past Surgical History: Past Surgical History:  Procedure Laterality Date  . ABDOMINAL HYSTERECTOMY  feb 2013  . COLONOSCOPY WITH PROPOFOL N/A 08/02/2015   Procedure: COLONOSCOPY WITH PROPOFOL;  Surgeon: Manya Silvas, MD;  Location[facility] END[facility]COPY;  Service: Endoscopy;  Laterality: N/A;  . ESOPHAGOGASTRODUODENOSCOPY (EGD) WITH PROPOFOL N/A 08/02/2015   Procedure: ESOPHAGOGASTRODUODENOSCOPY (EGD) WITH PROPOFOL;  Surgeon: Manya Silvas, MD;  Location: [facility] ENDOSCOPY;  Service: Endoscopy;  Laterality: N/A;    Past Gynecologic History:  See gynecologic oncology history     OB History:  OB History  No data available    Family History: Family History  Problem Relation Age of Onset  . Diabetes Mother   . Diabetes Father     Social History: Social History   Social History  . Marital status: Single    Spouse name: N/A  . Number of children: N/A  . Years of education: N/A   Occupational History  . Not on file.   Social History Main Topics  . Smoking status: Never Smoker  . Smokeless tobacco: Never Used  . Alcohol use No  . Drug use: No  . Sexual activity: No   Other Topics  Concern  . Not on file   Social History Narrative  . No narrative on file    Allergies: Allergies  Allergen Reactions  . Solifenacin Other (See Comments)  . Contrast Media [Iodinated Diagnostic Agents]   . Dicyclomine Other (See Comments)  . Isovue [Iopamidol] Other (See Comments)    Wheezing     Current Medications: Current Outpatient Prescriptions  Medication Sig Dispense Refill  . albuterol (PROVENTIL HFA;VENTOLIN HFA) 108 (90 Base) MCG/ACT inhaler Inhale 1 puff into the lungs every 6 (six) hours as needed for wheezing or shortness of breath.    . allopurinol (ZYLOPRIM) 100 MG tablet Take 100 mg by mouth daily.     Marland Kitchen amLODipine (NORVASC) 10 MG tablet Take 10 mg by mouth daily.    Marland Kitchen aspirin (ASPIRIN EC) 81 MG EC tablet Take 81 mg by mouth daily. Swallow whole.    . cholecalciferol (VITAMIN D) 1000 UNITS tablet Take 1,000 Units by mouth daily.    . diclofenac sodium (VOLTAREN) 1 % GEL Apply 1 application topically 2 (two) times daily as needed for pain.    Marland Kitchen enalapril (VASOTEC) 2.5 MG tablet Take 2.5 mg by mouth daily.     . ferrous sulfate 325 (65 FE) MG tablet Take 325 mg by mouth 2 (two) times daily with a meal.     .  gabapentin (NEURONTIN) 300 MG capsule Take 300 mg by mouth at bedtime.    . insulin aspart (NOVOLOG) 100 UNIT/ML injection Inject 20 Units into the skin 3 (three) times daily before meals. 24 units in am, 22 units at lunch and 58 units at supper    . Insulin Glargine (LANTUS SOLOSTAR) 100 UNIT/ML Solostar Pen Inject 50 Units as directed at bedtime.     Marland Kitchen Lifitegrast (XIIDRA) 5 % SOLN Apply 1 drop to eye as needed for dry eyes.    . metFORMIN (GLUCOPHAGE-XR) 500 MG 24 hr tablet Take 2 tablets by mouth 2 (two) times daily.     Marland Kitchen omeprazole (PRILOSEC) 40 MG capsule Take 40 mg by mouth daily.     . vitamin B-12 (CYANOCOBALAMIN) 500 MCG tablet Take 1 tablet by mouth daily.     No current facility-administered medications for this visit.     Review of  Systems General: no complaints  HEENT: no complaints  Lungs: no complaints  Cardiac: no complaints  GI: abdominal pain  GU: negative  Musculoskeletal: no complaints  Extremities: no complaints  Skin: no complaints  Neuro: no complaints  Endocrine: no complaints  Psych: no complaints       Objective:  Physical Examination:  Temp 98.4 F (36.9 C) (Tympanic)   Ht 5\' 4"  (1.626 m)   Wt 259 lb (117.5 kg)   BMI 44.46 kg/m    ECOG Performance Status: 1 - Symptomatic but completely ambulatory  General appearance: alert, cooperative and appears stated age HEENT:PERRLA, extra ocular movement intact and sclera clear, anicteric Lymph node survey: non-palpable, axillary, inguinal, supraclavicular Cardiovascular: regular rate and rhythm Respiratory: decreased breath sounds, lungs clear to auscultation Abdomen: obese, firm, protuberant, distended, nontender, hernia at the umbilical site, and well healed incision Extremities: edema  chronic per patient. Abnormal mole on left thigh still present and unchanged.  Neurological exam reveals alert, oriented, normal speech, no focal findings or movement disorder noted. She walks with a cane.   Pelvic: chaperoned by nurse;  Vulva: normal appearing vulva with no masses, tenderness or lesions; Vagina: normal vagina; Adnexa: surgically absent bilateral, limited exam due to habitus and patient unable to tolerate exam; Uterus: surgically absent, vaginal cuff well healed; Cervix: absent; Rectal: not performed  Very difficult exam - we had to position knee to chest in order to do her exam.  Lab Review Labs on site today: n/a  Radiologic Imaging: n/a    Assessment:  Keimani Laufer is a 56 y.o. female diagnosed with endometroid adenocarcinoma, grade 1-2, stage Ia endometrial cancer, NED. Inguinal adenopathy, borderline. Probable bilateral lymphocysts, asymptomatic. Abdominal pain, uncertain etiology. PET/CT scans were reassuring. Abnormal skin  lesion left thigh concerning for abnormal nevus/melanoma.  Medical co-morbidities complicating care: HTN, diabetes and obesity.  Plan:   Problem List Items Addressed This Visit      Genitourinary   Endometrial adenocarcinoma ([place]) - Primary   Relevant Orders   CT Abdomen Pelvis Wo Contrast    Other Visit Diagnoses    Generalized abdominal pain       Relevant Orders   CT Abdomen Pelvis Wo Contrast     .  Obtain CT scan if negative release from [place] [facility] [facility] to Dr. Ouida Sills for follow up. If positive for malignancy then RTC for discussion of management.   With regard to the abnormal skin lesion I have recommended she continue to follow up with her PCP or dermatologist.   I have spoken to her and her sister previously  regarding the risks of morbid obesity including multiple medical issues that they already have as well as cancers and risk of increased mortality.   Gillis Ends, MD    CC:  Dr. Ouida Sills  Dr. Vira Agar

## 2016-11-24 ENCOUNTER — Ambulatory Visit (INDEPENDENT_AMBULATORY_CARE_PROVIDER_SITE_OTHER): Payer: Medicare Other | Admitting: Vascular Surgery

## 2016-12-09 ENCOUNTER — Ambulatory Visit
Admission: RE | Admit: 2016-12-09 | Discharge: 2016-12-09 | Disposition: A | Discharge status: Home / Self Care | Payer: Medicare Other | Source: Ambulatory Visit | Attending: Obstetrics and Gynecology | Admitting: Obstetrics and Gynecology

## 2016-12-09 DIAGNOSIS — R1084 Generalized abdominal pain: Secondary | ICD-10-CM | POA: Diagnosis not present

## 2016-12-09 DIAGNOSIS — C541 Malignant neoplasm of endometrium: Secondary | ICD-10-CM | POA: Insufficient documentation

## 2016-12-09 DIAGNOSIS — R591 Generalized enlarged lymph nodes: Secondary | ICD-10-CM | POA: Diagnosis not present

## 2016-12-09 DIAGNOSIS — Z90710 Acquired absence of both cervix and uterus: Secondary | ICD-10-CM | POA: Diagnosis not present

## 2016-12-09 DIAGNOSIS — N83202 Unspecified ovarian cyst, left side: Secondary | ICD-10-CM | POA: Diagnosis not present

## 2016-12-09 DIAGNOSIS — I7090 Unspecified atherosclerosis: Secondary | ICD-10-CM | POA: Diagnosis not present

## 2016-12-09 DIAGNOSIS — K760 Fatty (change of) liver, not elsewhere classified: Secondary | ICD-10-CM | POA: Insufficient documentation

## 2016-12-12 ENCOUNTER — Telehealth: Payer: Self-pay

## 2016-12-12 NOTE — Telephone Encounter (Signed)
  Oncology Nurse Navigator Documentation Ms. Nghiem notified of CT results. Keep follow up as scheduled.  IMPRESSION: 1. Status posthysterectomy. No findings to suggest local recurrence of disease or metastatic disease in the abdomen or pelvis on today's noncontrast CT examination. 2. Bilateral inguinal lymphadenopathy is stable compared to prior examinations, and presumably benign. 3. Simple appearing left adnexal cyst is also stable compared to prior examinations dating back to 2016, also presumably benign. 4. Severe hepatic steatosis. 5. Atherosclerosis.  Navigator Location: [facility] (12/12/16 1100)   )Navigator Encounter Type: Diagnostic Results (12/12/16 1100)                                                    Time Spent with Patient: 15 (12/12/16 1100)

## 2016-12-27 IMAGING — MG MM DIGITAL DIAGNOSTIC UNILAT*R* W/ TOMO W/ CAD
6 series · 6 of 14 positions shown · non-contrast
Comparison: Previous exam(s).

CLINICAL DATA: Recall from screening mammography.

EXAM:
2D DIGITAL DIAGNOSTIC UNILATERAL RIGHT MAMMOGRAM WITH CAD AND
ADJUNCT TOMO

[R CC synth-2D]
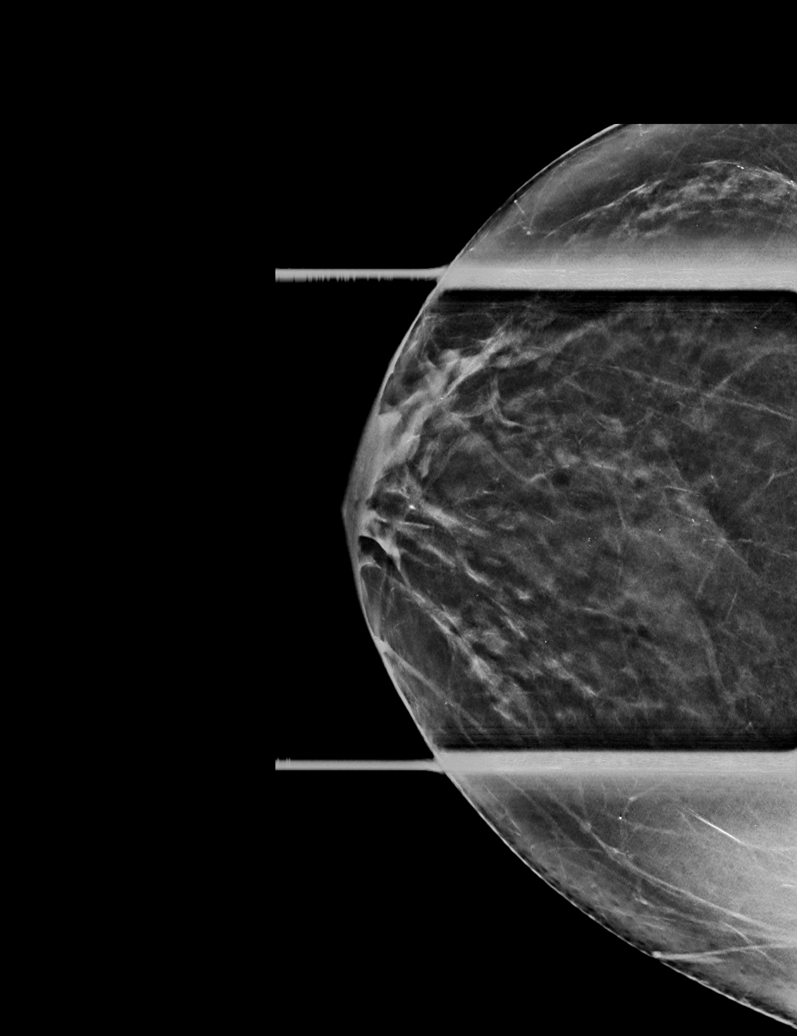

[R MLO synth-2D]
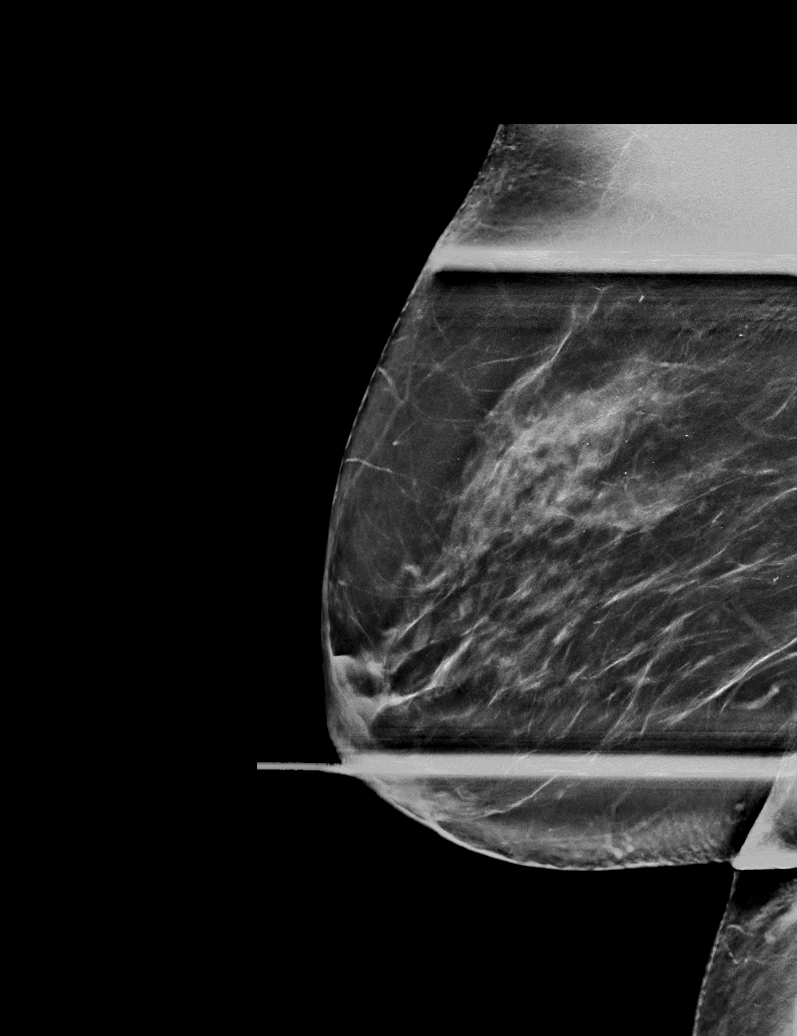

[R CC]
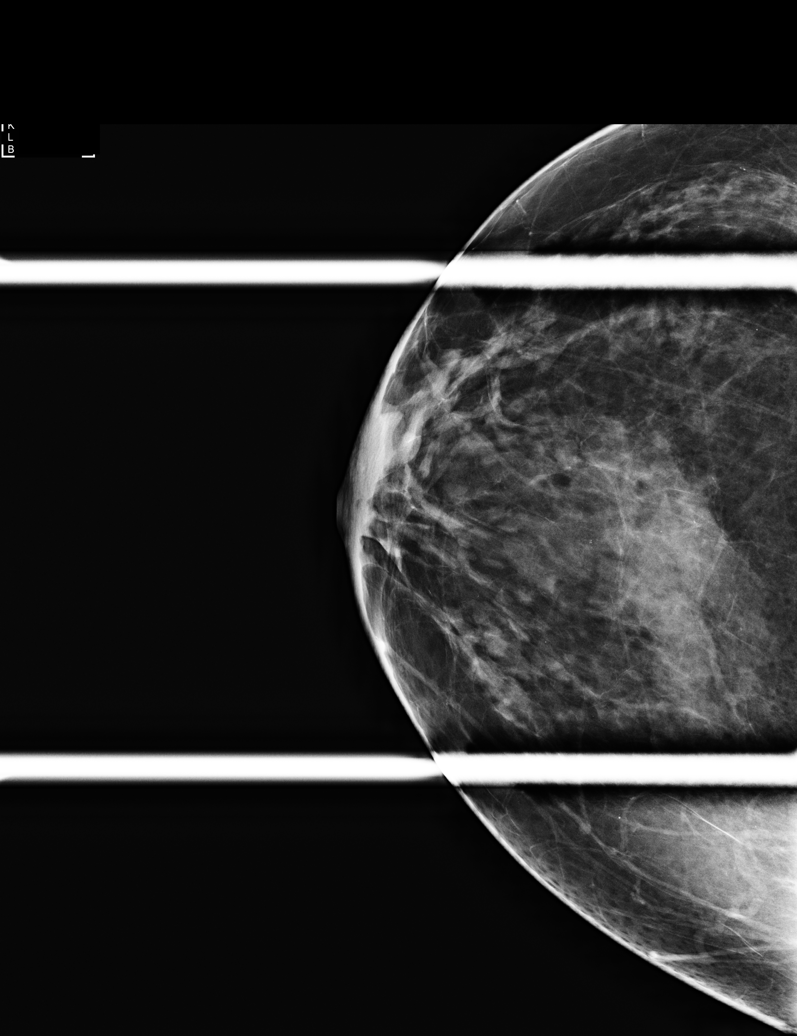

[R MLO]
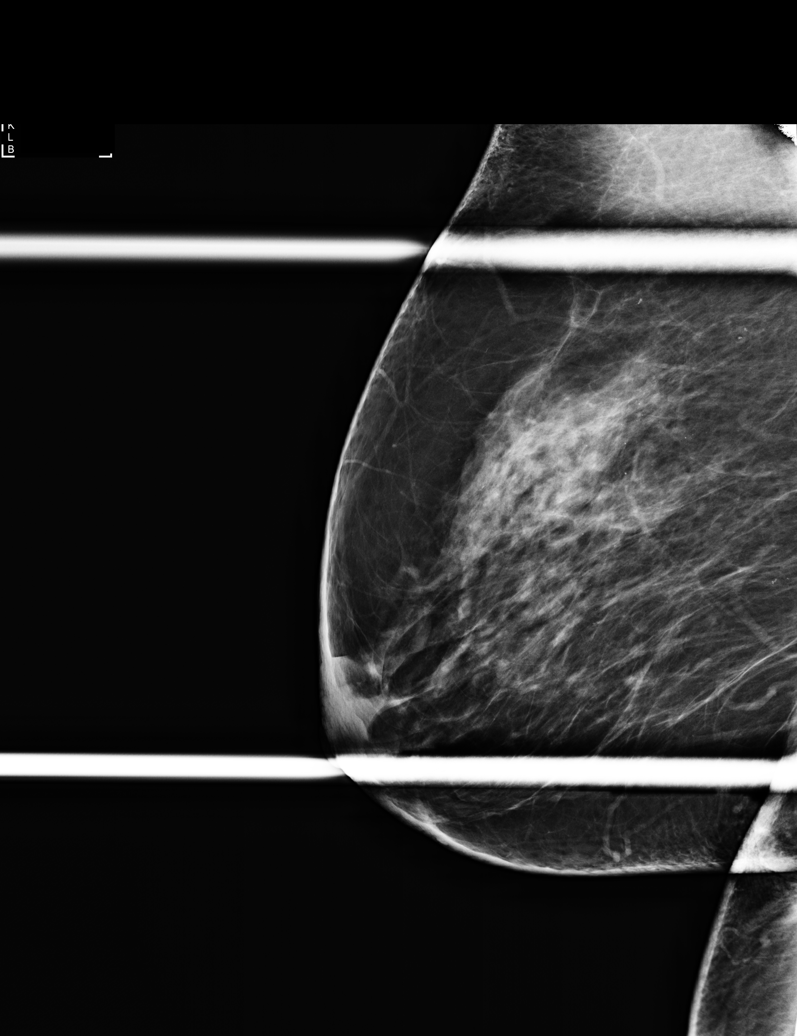

[R MLO tomo · tomo slice 41/81.0]
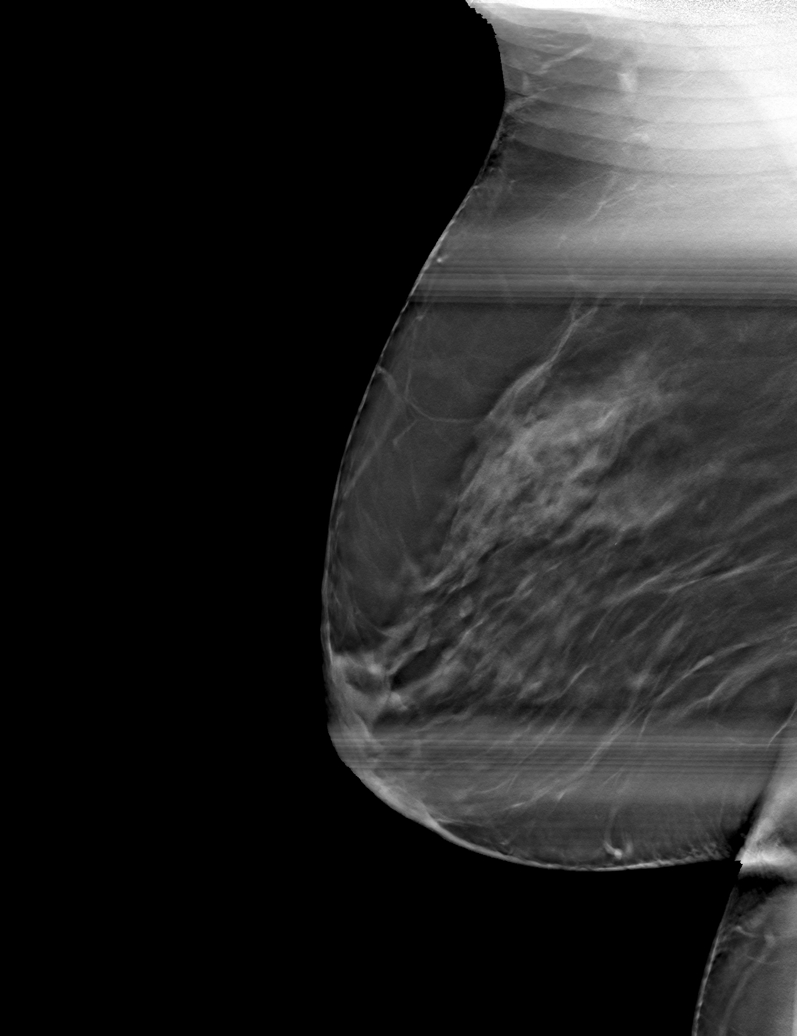

[R CC tomo · tomo slice 28/55.0]
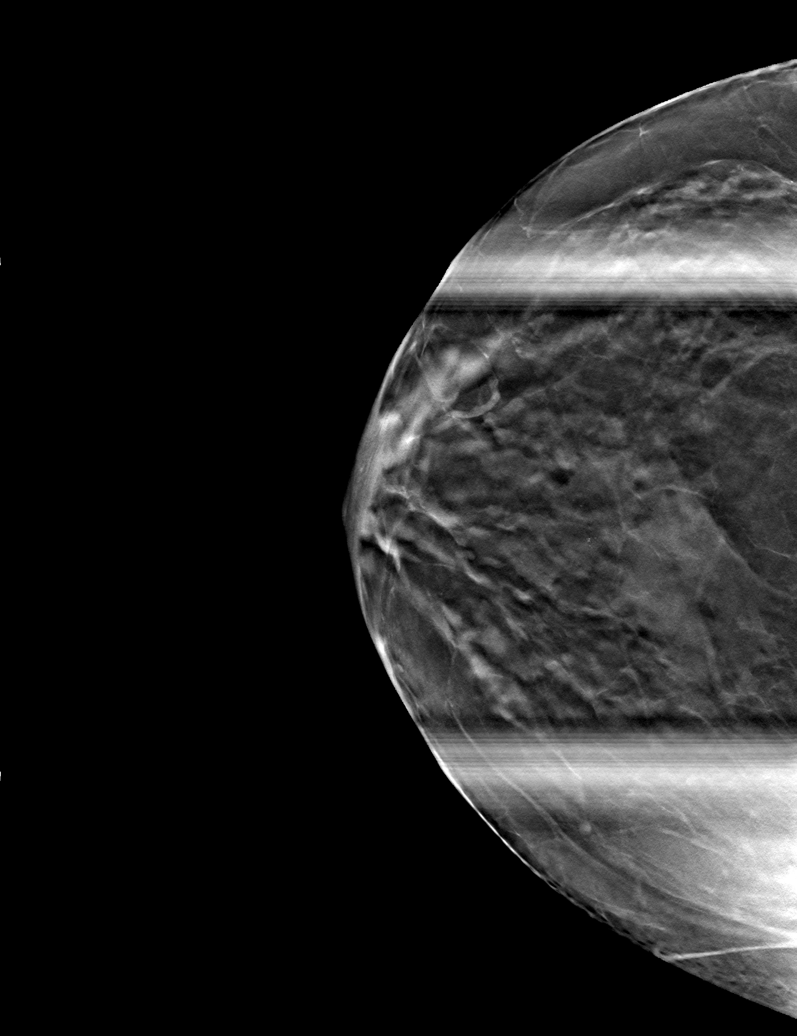

[6 of 14 positions shown; findings below may reference images not displayed]

ACR Breast Density Category b: There are scattered areas of
fibroglandular density.
FINDINGS: Additional views of the right breast demonstrate no persistent
abnormality. The appearance noted on the screening study is
consistent with a summation shadow.

Mammographic images were processed with CAD.
IMPRESSION: No persistent abnormality on additional evaluation of the right
breast.

RECOMMENDATION:
Screening mammography in 1 year.

I have discussed the findings and recommendations with the patient.
Results were also provided in writing at the conclusion of the
visit. If applicable, a reminder letter will be sent to the patient
regarding the next appointment.

BI-RADS CATEGORY  1: Negative.

## 2017-04-10 ENCOUNTER — Inpatient Hospital Stay: Payer: Medicare Other | Attending: Nurse Practitioner

## 2017-04-10 DIAGNOSIS — D500 Iron deficiency anemia secondary to blood loss (chronic): Secondary | ICD-10-CM | POA: Insufficient documentation

## 2017-04-17 ENCOUNTER — Other Ambulatory Visit: Payer: Medicare Other

## 2017-04-17 ENCOUNTER — Inpatient Hospital Stay: Payer: Medicare Other | Admitting: Nurse Practitioner

## 2017-04-17 ENCOUNTER — Inpatient Hospital Stay: Payer: Medicare Other

## 2017-04-24 ENCOUNTER — Inpatient Hospital Stay: Payer: Medicare Other

## 2017-04-24 ENCOUNTER — Ambulatory Visit: Payer: Medicare Other

## 2017-04-24 ENCOUNTER — Ambulatory Visit: Payer: Medicare Other | Admitting: Nurse Practitioner

## 2017-04-24 DIAGNOSIS — D500 Iron deficiency anemia secondary to blood loss (chronic): Secondary | ICD-10-CM | POA: Diagnosis present

## 2017-04-24 LAB — CBC WITH DIFFERENTIAL/PLATELET
BASOS ABS: 0.1 10*3/uL (ref 0–0.1)
BASOS PCT: 1 %
EOS ABS: 0.2 10*3/uL (ref 0–0.7)
EOS PCT: 2 %
HCT: 37.6 % (ref 35.0–47.0)
Hemoglobin: 12.5 g/dL (ref 12.0–16.0)
Lymphocytes Relative: 17 %
Lymphs Abs: 1.7 10*3/uL (ref 1.0–3.6)
MCH: 26.6 pg (ref 26.0–34.0)
MCHC: 33.2 g/dL (ref 32.0–36.0)
MCV: 80.1 fL (ref 80.0–100.0)
Monocytes Absolute: 0.5 10*3/uL (ref 0.2–0.9)
Monocytes Relative: 5 %
NEUTROS PCT: 75 %
Neutro Abs: 7.7 10*3/uL — ABNORMAL HIGH (ref 1.4–6.5)
PLATELETS: 405 10*3/uL (ref 150–440)
RBC: 4.7 MIL/uL (ref 3.80–5.20)
RDW: 13.9 % (ref 11.5–14.5)
WBC: 10.2 10*3/uL (ref 3.6–11.0)

## 2017-04-24 LAB — COMPREHENSIVE METABOLIC PANEL
ALBUMIN: 3.5 g/dL (ref 3.5–5.0)
ALT: 30 U/L (ref 14–54)
AST: 38 U/L (ref 15–41)
Alkaline Phosphatase: 112 U/L (ref 38–126)
Anion gap: 7 (ref 5–15)
BUN: 13 mg/dL (ref 6–20)
CHLORIDE: 100 mmol/L — AB (ref 101–111)
CO2: 29 mmol/L (ref 22–32)
CREATININE: 0.6 mg/dL (ref 0.44–1.00)
Calcium: 8.8 mg/dL — ABNORMAL LOW (ref 8.9–10.3)
GFR calc non Af Amer: >60 mL/min (ref >60)
GLUCOSE: 253 mg/dL — AB (ref 65–99)
Potassium: 3.5 mmol/L (ref 3.5–5.1)
SODIUM: 136 mmol/L (ref 135–145)
Total Bilirubin: 0.5 mg/dL (ref 0.3–1.2)
Total Protein: 8.1 g/dL (ref 6.5–8.1)

## 2017-04-28 ENCOUNTER — Telehealth: Payer: Self-pay | Admitting: *Deleted

## 2017-04-28 NOTE — Telephone Encounter (Signed)
Per Rulon Abide, NP- no IV iron is needed based on hgb. Have patient follow-up with DR. B in 6 months with iron studies/ferritin/CBC BMP; few days prior to appt; with possible IV ferraheme.

## 2017-04-28 NOTE — Telephone Encounter (Signed)
Patient had called asking for results of labs Friday and I told her results and then she asked if she needed and iron infusion. I explained to her that Iron level and Ferr was not checked. She has cancelled her appointment 2/22 wanting to wait until her labs were done to see if she needed iron. Please advise

## 2017-04-28 NOTE — Telephone Encounter (Addendum)
The iron panels can take several hours to come back, so we are not able to have labs/,md  infusion in the same day.

## 2017-05-28 ENCOUNTER — Other Ambulatory Visit: Payer: Self-pay | Admitting: Student

## 2017-05-28 DIAGNOSIS — M7582 Other shoulder lesions, left shoulder: Secondary | ICD-10-CM

## 2017-05-28 DIAGNOSIS — S63502A Unspecified sprain of left wrist, initial encounter: Secondary | ICD-10-CM

## 2017-06-03 ENCOUNTER — Ambulatory Visit: Payer: Medicare Other | Attending: Internal Medicine | Admitting: Physical Therapy

## 2017-06-03 DIAGNOSIS — M25612 Stiffness of left shoulder, not elsewhere classified: Secondary | ICD-10-CM | POA: Insufficient documentation

## 2017-06-03 DIAGNOSIS — M25632 Stiffness of left wrist, not elsewhere classified: Secondary | ICD-10-CM | POA: Diagnosis present

## 2017-06-03 NOTE — Therapy (Signed)
[facility] [address], [place], [address] Phone: [phone]   Fax:  [phone]  Physical Therapy Evaluation  Patient Details  Name: Christine Lawson MRN: [id number] Date of Birth: January 23, 1961 Referring Provider: Ginette Pitman   Encounter Date: 06/03/2017  PT End of Session - 06/04/17 0933    Visit Number  1    Number of Visits  17    Date for PT Re-Evaluation  07/29/17    PT Start Time  0400    PT Stop Time  0507    PT Time Calculation (min)  67 min    Activity Tolerance  Patient tolerated treatment well;Patient limited by pain    Behavior During Therapy  [facility] for tasks assessed/performed       Past Medical History:  Diagnosis Date  . Diabetes mellitus without complication ([place])   . Endometrial adenocarcinoma ([place])   . GERD (gastroesophageal reflux disease)   . Hematochezia   . Hyperlipidemia   . Hypertension   . IDA (iron deficiency anemia)   . Inguinal lymphadenopathy 07/13/2015  . Irritable bowel syndrome   . Obesity   . Osteoarthritis   . Sleep apnea     Past Surgical History:  Procedure Laterality Date  . ABDOMINAL HYSTERECTOMY  feb 2013  . COLONOSCOPY WITH PROPOFOL N/A 08/02/2015   Procedure: COLONOSCOPY WITH PROPOFOL;  Surgeon: Manya Silvas, MD;  Location: [facility] ENDOSCOPY;  Service: Endoscopy;  Laterality: N/A;  . ESOPHAGOGASTRODUODENOSCOPY (EGD) WITH PROPOFOL N/A 08/02/2015   Procedure: ESOPHAGOGASTRODUODENOSCOPY (EGD) WITH PROPOFOL;  Surgeon: Manya Silvas, MD;  Location: [facility] ENDOSCOPY;  Service: Endoscopy;  Laterality: N/A;    There were no vitals filed for this visit.   Subjective Assessment - 06/03/17 1605    Subjective  Acute L shoulder pain; L wrist sprain    Pertinent History  Patient is 57 year old female that reports a fall resulting in a chair falling onto L shoulder and wrist at the beginning of March. Patient reports that MD instructed her to wear shoulder sling as needed  in community and follow up appointment Dr. Theresa Mulligan (3/27) instructed patient to wear L wrist brace for wrist sprain. Patient is scheduled for MRI 4/8 for shoulder and wrist. Patient reports worst pain in L shoulder 10/10 in past week, worst pain in L wrist 10/10. Patient reports the best her pain has been in the R shoulder in the past week: 8/10 wrist:  9/10    Limitations  House hold activities;Lifting;Writing;Sitting;Standing;Walking    How long can you sit comfortably?  Pain in all positions    How long can you stand comfortably?  Pain in all positions    How long can you walk comfortably?  Pain in all positions    Diagnostic tests  MRI scheduled 4/8    Patient Stated Goals  decrease pain    Currently in Pain?  Yes    Pain Score  9     Pain Location  Shoulder    Pain Orientation  Left    Pain Descriptors / Indicators  Aching;Sharp    Pain Type  Acute pain    Pain Radiating Towards  down lateral aspect of the shoulder    Pain Onset  1 to 4 weeks ago    Pain Frequency  Constant    Aggravating Factors   all movements    Pain Relieving Factors  ice    Effect of Pain on Daily Activities  Patient reports she  is unable to move the shoulder without pain at all    Multiple Pain Sites  Yes    Pain Score  8    Pain Location  Wrist    Pain Orientation  Left    Pain Descriptors / Indicators  Dull;Aching    Pain Type  Acute pain    Pain Onset  1 to 4 weeks ago    Pain Frequency  Constant    Aggravating Factors   gripping, all movements    Pain Relieving Factors  Rest    Effect of Pain on Daily Activities  Unable to complete ADLs without pain              AROM Wrist ext L 35d with painR wnl Wrist flex L 70 R wnl Forearm supination and pronation wnl bilat Elbow flex L 72d (from 90d neutral) with pain at ant lateral shoulder R wnl Elbow ext: wnl bilat Shoulder flex L 80d with pain at ant lateral shoulder  R wnl Shoulder ext L 13d with pain at ant lateral shoulder R wnl Shoulder abd L  99 with pain at ant lateral shoulder R wnl Shoulder IR L to L iliac crest R to T9 Shoulder ER L to top of head with pain at ant lateral shoulder R C7 Cervical flex wnl Cervical ext 19d w/ pain at ant/lateral shoulder Cervical rotation L 40d with pain at ant/lateral shoulder R 51d Cervical lateral bending L 39d R 20d with pain with each at L shoulder Cervical retraction wnl Cervical protraction  wnl with pain at ant lateral shoulder  PROM Wrist ext L 40d with pain R wnl Wrist flex L 79 R wnl R wnl Forearm supination and pronation wnl bilat Elbow flex wnl bilat Elbow ext: wnl bilat Shoulder flex L 122d with pain at ant lateral shoulder  R wnl Shoulder ext L 70d with pain at ant lateral shoulder R wnl Shoulder abd L 114 with pain at ant lateral shoulder R wnl Shoulder IR L 68d R wnl Shoulder ER L 45d R wnl All passive cervical motion wnl with gaurding in all directions  Strength Grip strength R: 15kg L: 8kg  Posture forward head, rounded shoulders, increased kyphosis  Palpation Patient is extremely tender to palpation of L shoulder with the slightest pressure. Patient has tightness in L pec minor, bicep, UT, levator, deltoid. Patient is able to tolerate slight pressure over palable mass of dorsal wrist        Objective measurements completed on examination: See above findings.              PT Education - 06/04/17 0932    Education provided  Yes    Education Details  Patient was educated on diagnosis, anatomy and pathology involved, prognosis, role of PT, and was given an HEP, demonstrating exercise with proper form following verbal and tactile cues, and was given a paper hand out to continue exercise at home. Pt was educated on and agreed to plan of care.    Person(s) Educated  Patient    Methods  Explanation;Demonstration;Tactile cues;Verbal cues;Handout    Comprehension  Verbal cues required;Tactile cues required;Returned demonstration;Verbalized understanding        PT Short Term Goals - 06/04/17 4259      PT SHORT TERM GOAL #1   Title  Pt will be independent with HEP in order to improve strength and balance in order to decrease fall risk and improve function at home and work.    Time  2    Period  Weeks    Status  New    Target Date  06/18/17        PT Long Term Goals - 06/04/17 1230      PT LONG TERM GOAL #1   Title  Patient will increase FOTO score to 56 to demonstrate predicted increase in functional mobility to complete ADLs    Baseline  4/4 36    Time  8    Period  Weeks    Status  New      PT LONG TERM GOAL #2   Title  Pt will decrease worst pain as reported on NPRS by at least 3 points in order to demonstrate clinically significant reduction in pain.    Baseline  4/4: 10/10 in L shoulder and wrist    Time  8    Period  Weeks    Status  New      PT LONG TERM GOAL #3   Title  Pt will be able to open a jar to each side successfully without pain in order to complete ADLs    Baseline  4/4 unable    Time  8    Period  Weeks    Status  New      PT LONG TERM GOAL #4   Title  Patient will demonstrate L grip strength of 15kg to return to PLOF in order to complete ADLs    Baseline  4/4 8kg    Time  8    Period  Weeks    Status  New             Plan - 06/04/17 1418    Clinical Impression Statement  Pt is a year old female with signs and symptoms of L anterior strain, and possible wrist extensor strain following a fall May 04, 2017 where she fell onto the L UE with chair subsequently falling on the limb. Current activity limitations in gripping, lifting, and all active movements with L UE. Impairments including decreased L shoulder/wrist passive and active ROM, ant L shoulder and L wrist pain, postural deficits, and decreased strength of L UE . D/t these impairments patient is unable to complete ADLs (bathing/dressing/cooking). Patient will benefit from skilled PT to safely return to PLOF.      History and Personal Factors  relevant to plan of care:   2 personal factors/comorbidities, 3 body systems/activity limitations/participation restrictions     Clinical Presentation  Evolving    Clinical Presentation due to:  multiple pain sites    Clinical Decision Making  Moderate    Rehab Potential  Fair    Clinical Impairments Affecting Rehab Potential  (-) transportation, multiple comorbidities (DM II, obesity, GERD, communication barrier, sedentary lifestyle (+) positive attitude,     PT Frequency  2x / week    PT Duration  8 weeks    PT Treatment/Interventions  Neuromuscular re-education;Passive range of motion;Manual techniques;Patient/family education;Taping;Therapeutic exercise;Therapeutic activities;Functional mobility training;Moist Heat;Ultrasound;Cryotherapy;Electrical Stimulation    PT Next Visit Plan  Assess MMT as able (definitely of R UE for baseline), HEP review, Goal review, Shoulder/wrist PROM/AAROM and manual for muscle spasm    PT Home Exercise Plan  Cane assisted ER/abd/flex, scap retractions, wrist AAROM with other hand into flex/ext, desensitization to L shoulder (brushing)    Consulted and Agree with Plan of Care  Patient       Patient will benefit from skilled therapeutic intervention in order to improve the following deficits and impairments:  Increased fascial restricitons, Pain, Improper body mechanics, Increased muscle spasms, Impaired tone, Postural dysfunction, Decreased activity tolerance, Decreased endurance, Decreased range of motion, Decreased strength, Impaired flexibility  Visit Diagnosis: Stiffness of left shoulder, not elsewhere classified  Stiffness of left wrist, not elsewhere classified     Problem List Patient Active Problem List   Diagnosis Date Noted  . Lymphedema 01/17/2016  . Venous (peripheral) insufficiency 01/17/2016  . Pain in limb 01/17/2016  . Iron deficiency anemia due to chronic blood loss 01/09/2016  . History of endometrial cancer 08/01/2015  .  Endometrial adenocarcinoma ([place]) 07/13/2015    Class: History of  . Inguinal lymphadenopathy 07/13/2015  . Long term current use of insulin ([place]) 12/12/2013  . Microalbuminuria 12/12/2013  . Diabetes ([place]) 07/28/2013  . BP (high blood pressure) 07/28/2013  . HLD (hyperlipidemia) 07/28/2013  . Adiposity 07/28/2013  . Obstructive apnea 07/28/2013  . Arthritis, degenerative 07/28/2013  . Apnea, sleep 07/28/2013  . Thyroid nodule 07/28/2013   [name] PT, DPT [name] 06/04/2017, 4:30 PM  [facility] [address], [place], [address] Phone: [phone]   Fax:  [phone]  Name: Amos Gaber MRN: [id number] Date of Birth: 1960-03-15

## 2017-06-04 ENCOUNTER — Encounter: Payer: Self-pay | Admitting: Physical Therapy

## 2017-06-08 ENCOUNTER — Ambulatory Visit
Admission: RE | Admit: 2017-06-08 | Discharge: 2017-06-08 | Disposition: A | Discharge status: Home / Self Care | Payer: Medicare Other | Source: Ambulatory Visit | Attending: Student | Admitting: Student

## 2017-06-08 DIAGNOSIS — M7582 Other shoulder lesions, left shoulder: Secondary | ICD-10-CM

## 2017-06-08 DIAGNOSIS — S63502A Unspecified sprain of left wrist, initial encounter: Secondary | ICD-10-CM

## 2017-06-09 ENCOUNTER — Encounter: Payer: Self-pay | Admitting: Physical Therapy

## 2017-06-09 ENCOUNTER — Ambulatory Visit: Payer: Medicare Other | Admitting: Physical Therapy

## 2017-06-09 DIAGNOSIS — M25632 Stiffness of left wrist, not elsewhere classified: Secondary | ICD-10-CM

## 2017-06-09 DIAGNOSIS — M25612 Stiffness of left shoulder, not elsewhere classified: Secondary | ICD-10-CM

## 2017-06-09 NOTE — Therapy (Signed)
[facility] [address], [place], [address] Phone: [phone]   Fax:  [phone]  Physical Therapy Treatment  Patient Details  Name: Christine Lawson MRN: [id number] Date of Birth: 10/29/1960 Referring Provider: Ginette Pitman   Encounter Date: 06/09/2017  PT End of Session - 06/09/17 1500    Visit Number  2    Number of Visits  17    Date for PT Re-Evaluation  07/29/17    PT Start Time  0230    PT Stop Time  0315    PT Time Calculation (min)  45 min    Activity Tolerance  Patient tolerated treatment well;Patient limited by pain    Behavior During Therapy  [facility] for tasks assessed/performed;Impulsive       Past Medical History:  Diagnosis Date  . Diabetes mellitus without complication ([place])   . Endometrial adenocarcinoma ([place])   . GERD (gastroesophageal reflux disease)   . Hematochezia   . Hyperlipidemia   . Hypertension   . IDA (iron deficiency anemia)   . Inguinal lymphadenopathy 07/13/2015  . Irritable bowel syndrome   . Obesity   . Osteoarthritis   . Sleep apnea     Past Surgical History:  Procedure Laterality Date  . ABDOMINAL HYSTERECTOMY  feb 2013  . COLONOSCOPY WITH PROPOFOL N/A 08/02/2015   Procedure: COLONOSCOPY WITH PROPOFOL;  Surgeon: Manya Silvas, MD;  Location: [facility] ENDOSCOPY;  Service: Endoscopy;  Laterality: N/A;  . ESOPHAGOGASTRODUODENOSCOPY (EGD) WITH PROPOFOL N/A 08/02/2015   Procedure: ESOPHAGOGASTRODUODENOSCOPY (EGD) WITH PROPOFOL;  Surgeon: Manya Silvas, MD;  Location: [facility] ENDOSCOPY;  Service: Endoscopy;  Laterality: N/A;    There were no vitals filed for this visit.  Subjective Assessment - 06/09/17 1437    Subjective  Patient reports she has not worn her shoulder sling since last visit. Patient reports she has not been able to do her HEP, d/t 10/10 pain but that she has been doing her UT stretch. Patient reports 10/10 pain verbally, but is visually in 3/10 pain Patient  reports she was going to complete an MRI yesterday, but had a panic attack and therefore had to re-schedule    Pertinent History  Patient is 57 year old female that reports a fall resulting in a chair falling onto L shoulder and wrist at the beginning of March. Patient reports that MD instructed her to wear shoulder sling as needed in community and follow up appointment Dr. Theresa Mulligan (3/27) instructed patient to wear L wrist brace for wrist sprain. Patient is scheduled for MRI 4/8 for shoulder and wrist. Patient reports worst pain in L shoulder 10/10 in past week, worst pain in L wrist 10/10. Patient reports the best her pain has been in the R shoulder in the past week: 8/10 wrist:  9/10    Limitations  House hold activities;Lifting;Writing;Sitting;Standing;Walking    How long can you sit comfortably?  Pain in all positions    How long can you stand comfortably?  Pain in all positions    How long can you walk comfortably?  Pain in all positions    Diagnostic tests  MRI scheduled 4/8    Patient Stated Goals  decrease pain    Pain Onset  1 to 4 weeks ago         Ther-Ex -Pulleys flexion and abd 20x 3-5sec hold each position  -Cane assisted IR 20x 3-5 sec holds (attempted towel assistance and unable) -Cane assisted ER 20x 3-5 sec holds -Seated  tricep stretch 3x 30sec holds - Doorway bicep stretch 3x 30sec holds  Manual -Attempted PROM in all directions and this was very difficult d/t extreme muscle guarding  -STM and trigger point release to L bicep, UT, and ant and mid deltoid increasing pressure as able, but patient very apprehensive so attempted ESTIM (see below). Patient able to tolerate mod palpation today, which is much better than evaluation    ESTIM + heat pack HiVolt ESTIM 15 min at patient tolerated 75V increased to 80V through treatment at L UT and ant delt/bicep area. Attempted d/t success of treatment at previous session success. With PT assessing patient tolerance throughout  (decreasing intensity as needed), monitoring skin integrity (normal), with decreased pain noted from patient                        PT Education - 06/09/17 1450    Education provided  Yes    Education Details  Exercise form    Person(s) Educated  Patient    Methods  Explanation;Demonstration;Tactile cues;Verbal cues    Comprehension  Verbal cues required;Tactile cues required;Verbalized understanding;Need further instruction;Returned demonstration       PT Short Term Goals - 06/04/17 [phone]      PT SHORT TERM GOAL #1   Title  Pt will be independent with HEP in order to improve strength and balance in order to decrease fall risk and improve function at home and work.    Time  2    Period  Weeks    Status  New    Target Date  06/18/17        PT Long Term Goals - 06/04/17 1230      PT LONG TERM GOAL #1   Title  Patient will increase FOTO score to 56 to demonstrate predicted increase in functional mobility to complete ADLs    Baseline  4/4 36    Time  8    Period  Weeks    Status  New      PT LONG TERM GOAL #2   Title  Pt will decrease worst pain as reported on NPRS by at least 3 points in order to demonstrate clinically significant reduction in pain.    Baseline  4/4: 10/10 in L shoulder and wrist    Time  8    Period  Weeks    Status  New      PT LONG TERM GOAL #3   Title  Pt will be able to open a jar to each side successfully without pain in order to complete ADLs    Baseline  4/4 unable    Time  8    Period  Weeks    Status  New      PT LONG TERM GOAL #4   Title  Patient will demonstrate L grip strength of 15kg to return to PLOF in order to complete ADLs    Baseline  4/4 8kg    Time  8    Period  Weeks    Status  New            Plan - 06/09/17 1526    Clinical Impression Statement  Pt is demonstrating less hypersensitivity and is able to tolerate manual techniques today. Patient is continuing to demonstrate decreased ROM, but it is this  PT's assessment this is largely d/t increased muscle guarding. Throughout session patient is very distracted with her sisters session and dramatizes her pain with AAROM for  attention. PT attempted to pull patient into private treatment room and explain that AAROM is supposed to be into comfortable ROM that is a gentle stretch and not pain to negate this, but was fairly unsuccessful. Following manual and ESTIM treatment patient reports decreased pain from 10/10 to 9/10 and demonstrates better AROM.    Clinical Impairments Affecting Rehab Potential  (-) transportation, multiple comorbidities (DM II, obesity, GERD, communication barrier, sedentary lifestyle (+) positive attitude,     PT Frequency  2x / week    PT Duration  8 weeks    PT Treatment/Interventions  Neuromuscular re-education;Passive range of motion;Manual techniques;Patient/family education;Taping;Therapeutic exercise;Therapeutic activities;Functional mobility training;Moist Heat;Ultrasound;Cryotherapy;Electrical Stimulation    PT Next Visit Plan  Assess MMT as able (definitely of R UE for baseline), HEP review, Goal review, Shoulder/wrist PROM/AAROM and manual for muscle spasm    PT Home Exercise Plan  Cane assisted ER/abd/flex, scap retractions, wrist AAROM with other hand into flex/ext, desensitization to L shoulder (brushing)    Consulted and Agree with Plan of Care  Patient       Patient will benefit from skilled therapeutic intervention in order to improve the following deficits and impairments:  Increased fascial restricitons, Pain, Improper body mechanics, Increased muscle spasms, Impaired tone, Postural dysfunction, Decreased activity tolerance, Decreased endurance, Decreased range of motion, Decreased strength, Impaired flexibility  Visit Diagnosis: Stiffness of left shoulder, not elsewhere classified  Stiffness of left wrist, not elsewhere classified     Problem List Patient Active Problem List   Diagnosis Date Noted  .  Lymphedema 01/17/2016  . Venous (peripheral) insufficiency 01/17/2016  . Pain in limb 01/17/2016  . Iron deficiency anemia due to chronic blood loss 01/09/2016  . History of endometrial cancer 08/01/2015  . Endometrial adenocarcinoma ([place]) 07/13/2015    Class: History of  . Inguinal lymphadenopathy 07/13/2015  . Long term current use of insulin ([place]) 12/12/2013  . Microalbuminuria 12/12/2013  . Diabetes ([place]) 07/28/2013  . BP (high blood pressure) 07/28/2013  . HLD (hyperlipidemia) 07/28/2013  . Adiposity 07/28/2013  . Obstructive apnea 07/28/2013  . Arthritis, degenerative 07/28/2013  . Apnea, sleep 07/28/2013  . Thyroid nodule 07/28/2013   [name] PT, DPT [name] 06/09/2017, 3:47 PM  [facility] [address], [place], [address] Phone: [phone]   Fax:  [phone]  Name: Christine Lawson MRN: [id number] Date of Birth: January 10, 1961

## 2017-06-11 ENCOUNTER — Encounter: Payer: Self-pay | Admitting: Physical Therapy

## 2017-06-11 ENCOUNTER — Ambulatory Visit: Payer: Medicare Other | Admitting: Physical Therapy

## 2017-06-11 DIAGNOSIS — M25612 Stiffness of left shoulder, not elsewhere classified: Secondary | ICD-10-CM | POA: Diagnosis not present

## 2017-06-11 NOTE — Therapy (Signed)
[facility] [address], [place], [address] Phone: [phone]   Fax:  [phone]  Physical Therapy Treatment  Patient Details  Name: Christine Lawson MRN: [id number] Date of Birth: 18-Apr-1960 Referring Provider: Ginette Pitman   Encounter Date: 06/11/2017  PT End of Session - 06/11/17 1511    Visit Number  3    Number of Visits  17    Date for PT Re-Evaluation  07/29/17    PT Start Time  0250    PT Stop Time  0330    PT Time Calculation (min)  40 min    Activity Tolerance  Patient tolerated treatment well;Patient limited by pain    Behavior During Therapy  [facility] for tasks assessed/performed;Impulsive       Past Medical History:  Diagnosis Date  . Diabetes mellitus without complication ([place])   . Endometrial adenocarcinoma ([place])   . GERD (gastroesophageal reflux disease)   . Hematochezia   . Hyperlipidemia   . Hypertension   . IDA (iron deficiency anemia)   . Inguinal lymphadenopathy 07/13/2015  . Irritable bowel syndrome   . Obesity   . Osteoarthritis   . Sleep apnea     Past Surgical History:  Procedure Laterality Date  . ABDOMINAL HYSTERECTOMY  feb 2013  . COLONOSCOPY WITH PROPOFOL N/A 08/02/2015   Procedure: COLONOSCOPY WITH PROPOFOL;  Surgeon: Manya Silvas, MD;  Location: [facility] ENDOSCOPY;  Service: Endoscopy;  Laterality: N/A;  . ESOPHAGOGASTRODUODENOSCOPY (EGD) WITH PROPOFOL N/A 08/02/2015   Procedure: ESOPHAGOGASTRODUODENOSCOPY (EGD) WITH PROPOFOL;  Surgeon: Manya Silvas, MD;  Location: [facility] ENDOSCOPY;  Service: Endoscopy;  Laterality: N/A;    There were no vitals filed for this visit.  Subjective Assessment - 06/11/17 1454    Subjective  Patient reports 9/10 pain today, but is visibly closer to 3/10 pain. Pt reports compliance with some of her HEP. Patient reports her pain was decreased following ESTIM treatment.    Pertinent History  Patient is 57 year old female that reports a fall  resulting in a chair falling onto L shoulder and wrist at the beginning of March. Patient reports that MD instructed her to wear shoulder sling as needed in community and follow up appointment Dr. Theresa Mulligan (3/27) instructed patient to wear L wrist brace for wrist sprain. Patient is scheduled for MRI 4/8 for shoulder and wrist. Patient reports worst pain in L shoulder 10/10 in past week, worst pain in L wrist 10/10. Patient reports the best her pain has been in the R shoulder in the past week: 8/10 wrist:  9/10    Limitations  House hold activities;Lifting;Writing;Sitting;Standing;Walking    How long can you sit comfortably?  Pain in all positions    How long can you stand comfortably?  Pain in all positions    How long can you walk comfortably?  Pain in all positions    Diagnostic tests  MRI scheduled 4/8    Patient Stated Goals  decrease pain    Pain Onset  1 to 4 weeks ago    Pain Onset  1 to 4 weeks ago         Ther-Ex -Pulleys AAROM 20x flex holding 3-5 secs and abd holding 3-5 secs with cuing for proper/neutral seated posture -UE ranger circles clockwise and counter-clockwise 3x 10 e with cuing for largest possible ROM -Towel AAROM IR 2x 12 with cuing for neutral posture with 3-5 sec holds  -Towel AAROM ER 2x 12 with cuing for  neutral posture with 3-5 sec holds  Manual -Multiple bouts of PROM into flex/abd/IR/ER holding for 3-5 sec in each position with all motions restricted with soft tissue restriction end feel. Patient able to tolerate more than Tuesday -Attempted G I AP GHJ mobilization, pt unable to tolerate    ESTIM + heat pack HiVolt ESTIM 13 min at patient tolerated 50V increased to 60V through treatment at LUT and lateral shoulder area . Attempted d/t success of treatment at previous session success. With PT assessing patient tolerance throughout (increasing intensity as needed), monitoring skin integrity (normal), with decreased pain noted from patient                         PT Education - 06/11/17 1504    Education provided  Yes    Education Details  Exercise form    Person(s) Educated  Patient    Methods  Explanation;Demonstration;Tactile cues    Comprehension  Verbalized understanding;Returned demonstration;Verbal cues required;Tactile cues required;Need further instruction       PT Short Term Goals - 06/04/17 9678      PT SHORT TERM GOAL #1   Title  Pt will be independent with HEP in order to improve strength and balance in order to decrease fall risk and improve function at home and work.    Time  2    Period  Weeks    Status  New    Target Date  06/18/17        PT Long Term Goals - 06/04/17 1230      PT LONG TERM GOAL #1   Title  Patient will increase FOTO score to 56 to demonstrate predicted increase in functional mobility to complete ADLs    Baseline  4/4 36    Time  8    Period  Weeks    Status  New      PT LONG TERM GOAL #2   Title  Pt will decrease worst pain as reported on NPRS by at least 3 points in order to demonstrate clinically significant reduction in pain.    Baseline  4/4: 10/10 in L shoulder and wrist    Time  8    Period  Weeks    Status  New      PT LONG TERM GOAL #3   Title  Pt will be able to open a jar to each side successfully without pain in order to complete ADLs    Baseline  4/4 unable    Time  8    Period  Weeks    Status  New      PT LONG TERM GOAL #4   Title  Patient will demonstrate L grip strength of 15kg to return to PLOF in order to complete ADLs    Baseline  4/4 8kg    Time  8    Period  Weeks    Status  New            Plan - 06/11/17 1522    Clinical Impression Statement  Patient was able to focus on therapy 50% more today and was able to demonstrate 50% ROM with active assistance (pulleys, towel, UE ranger). Pt is able to tolerate manual techniques much better today with noted muscle guarding, despite PT cuing to breathe deeply to attempt to  relax patient and prevent valsalva. All motions have soft tissue restriction end feel at this time. Following manual and ESTIM patient reports decreased pain to  8/10.     Clinical Impairments Affecting Rehab Potential  (-) transportation, multiple comorbidities (DM II, obesity, GERD, communication barrier, sedentary lifestyle (+) positive attitude,     PT Frequency  2x / week    PT Duration  8 weeks    PT Treatment/Interventions  Neuromuscular re-education;Passive range of motion;Manual techniques;Patient/family education;Taping;Therapeutic exercise;Therapeutic activities;Functional mobility training;Moist Heat;Ultrasound;Cryotherapy;Electrical Stimulation    PT Next Visit Plan  Assess MMT as able (definitely of R UE for baseline), HEP review, Goal review, Shoulder/wrist PROM/AAROM and manual for muscle spasm    PT Home Exercise Plan  Cane assisted ER/abd/flex, scap retractions, wrist AAROM with other hand into flex/ext, desensitization to L shoulder (brushing)    Consulted and Agree with Plan of Care  Patient       Patient will benefit from skilled therapeutic intervention in order to improve the following deficits and impairments:  Increased fascial restricitons, Pain, Improper body mechanics, Increased muscle spasms, Impaired tone, Postural dysfunction, Decreased activity tolerance, Decreased endurance, Decreased range of motion, Decreased strength, Impaired flexibility  Visit Diagnosis: Stiffness of left shoulder, not elsewhere classified     Problem List Patient Active Problem List   Diagnosis Date Noted  . Lymphedema 01/17/2016  . Venous (peripheral) insufficiency 01/17/2016  . Pain in limb 01/17/2016  . Iron deficiency anemia due to chronic blood loss 01/09/2016  . History of endometrial cancer 08/01/2015  . Endometrial adenocarcinoma ([place]) 07/13/2015    Class: History of  . Inguinal lymphadenopathy 07/13/2015  . Long term current use of insulin ([place]) 12/12/2013  .  Microalbuminuria 12/12/2013  . Diabetes ([place]) 07/28/2013  . BP (high blood pressure) 07/28/2013  . HLD (hyperlipidemia) 07/28/2013  . Adiposity 07/28/2013  . Obstructive apnea 07/28/2013  . Arthritis, degenerative 07/28/2013  . Apnea, sleep 07/28/2013  . Thyroid nodule 07/28/2013   [name] PT, DPT  [name] 06/11/2017, 3:43 PM  [facility] [address], [place], [address] Phone: [phone]   Fax:  [phone]  Name: Christine Lawson MRN: [id number] Date of Birth: 07/28/1960

## 2017-06-16 ENCOUNTER — Encounter: Payer: Self-pay | Admitting: Physical Therapy

## 2017-06-16 ENCOUNTER — Ambulatory Visit: Payer: Medicare Other | Admitting: Physical Therapy

## 2017-06-16 DIAGNOSIS — M25612 Stiffness of left shoulder, not elsewhere classified: Secondary | ICD-10-CM

## 2017-06-16 NOTE — Therapy (Signed)
[facility] [facility] [address], [place], [address] Phone: [phone]   Fax:  [phone]  Physical Therapy Treatment  Patient Details  Name: Christine Lawson MRN: [id number] Date of Birth: 09-17-1960 Referring Provider: Ginette Pitman   Encounter Date: 06/16/2017  PT End of Session - 06/16/17 1703    Visit Number  4    Number of Visits  17    Date for PT Re-Evaluation  07/29/17    PT Start Time  0315    PT Stop Time  0400    PT Time Calculation (min)  45 min    Activity Tolerance  Patient tolerated treatment well;Patient limited by pain    Behavior During Therapy  [facility] for tasks assessed/performed;Impulsive       Past Medical History:  Diagnosis Date  . Diabetes mellitus without complication ([place])   . Endometrial adenocarcinoma ([place])   . GERD (gastroesophageal reflux disease)   . Hematochezia   . Hyperlipidemia   . Hypertension   . IDA (iron deficiency anemia)   . Inguinal lymphadenopathy 07/13/2015  . Irritable bowel syndrome   . Obesity   . Osteoarthritis   . Sleep apnea     Past Surgical History:  Procedure Laterality Date  . ABDOMINAL HYSTERECTOMY  feb 2013  . COLONOSCOPY WITH PROPOFOL N/A 08/02/2015   Procedure: COLONOSCOPY WITH PROPOFOL;  Surgeon: Manya Silvas, MD;  Location: [facility] ENDOSCOPY;  Service: Endoscopy;  Laterality: N/A;  . ESOPHAGOGASTRODUODENOSCOPY (EGD) WITH PROPOFOL N/A 08/02/2015   Procedure: ESOPHAGOGASTRODUODENOSCOPY (EGD) WITH PROPOFOL;  Surgeon: Manya Silvas, MD;  Location: [facility] ENDOSCOPY;  Service: Endoscopy;  Laterality: N/A;    There were no vitals filed for this visit.  Subjective Assessment - 06/16/17 1521    Subjective  Patient reports she had some "aching" pain over the weekend, but reports she was moving the shoulder more over the weekend. Patient reports some deligence with HEP over the weekend. Patient reports she thinks the ESTIM is working really well to allow her to  move the shoulder more. Reports 7/10 pain today.     Pertinent History  Patient is 57 year old female that reports a fall resulting in a chair falling onto L shoulder and wrist at the beginning of March. Patient reports that MD instructed her to wear shoulder sling as needed in community and follow up appointment Dr. Theresa Mulligan (3/27) instructed patient to wear L wrist brace for wrist sprain. Patient is scheduled for MRI 4/8 for shoulder and wrist. Patient reports worst pain in L shoulder 10/10 in past week, worst pain in L wrist 10/10. Patient reports the best her pain has been in the R shoulder in the past week: 8/10 wrist:  9/10    Limitations  House hold activities;Lifting;Writing;Sitting;Standing;Walking    How long can you sit comfortably?  Pain in all positions    How long can you stand comfortably?  Pain in all positions    How long can you walk comfortably?  Pain in all positions    Diagnostic tests  MRI scheduled 4/8    Patient Stated Goals  decrease pain    Pain Onset  1 to 4 weeks ago    Pain Onset  1 to 4 weeks ago       Ther-Ex -Pulleys AAROM flexion x20 abd x20 with cuing for slow lowering -UE ranger clockwise circles and counth- clockwise circles 3x 10 w/ PT giving tactile cues for max possible shoulder ROM -Seated active shoulder  flex to 90d 3x 10 with cuing for eccentric lowering -Isometric shoulder abd, ER, IR 10x each position with 3 sec holds    ESTIM + heat pack [employer] ESTIM 15 min at patient tolerated 50V increased to 60V through treatment at [place] area . Attempted d/t success of treatment at previous session success. With PT assessing patient tolerance throughout (decreasing intensity as needed), monitoring skin integrity (normal), with decreased pain noted from patient. For 8 mins PT was able to do 8 bouts of PROM into each ER/IR/abd/flex with 3-5 sec holds with increased patient tolerance d/t ESTIM/heat treatment and desensitization/more available ROM                       PT Education - 06/16/17 1530    Education provided  Yes    Education Details  Exercise form    Person(s) Educated  Patient    Methods  Explanation;Demonstration;Tactile cues;Verbal cues    Comprehension  Verbal cues required;Tactile cues required;Returned demonstration;Verbalized understanding;Need further instruction       PT Short Term Goals - 06/04/17 2094      PT SHORT TERM GOAL #1   Title  Pt will be independent with HEP in order to improve strength and balance in order to decrease fall risk and improve function at home and work.    Time  2    Period  Weeks    Status  New    Target Date  06/18/17        PT Long Term Goals - 06/04/17 1230      PT LONG TERM GOAL #1   Title  Patient will increase FOTO score to 56 to demonstrate predicted increase in functional mobility to complete ADLs    Baseline  4/4 36    Time  8    Period  Weeks    Status  New      PT LONG TERM GOAL #2   Title  Pt will decrease worst pain as reported on NPRS by at least 3 points in order to demonstrate clinically significant reduction in pain.    Baseline  4/4: 10/10 in L shoulder and wrist    Time  8    Period  Weeks    Status  New      PT LONG TERM GOAL #3   Title  Pt will be able to open a jar to each side successfully without pain in order to complete ADLs    Baseline  4/4 unable    Time  8    Period  Weeks    Status  New      PT LONG TERM GOAL #4   Title  Patient will demonstrate L grip strength of 15kg to return to PLOF in order to complete ADLs    Baseline  4/4 8kg    Time  8    Period  Weeks    Status  New            Plan - 06/16/17 1706    Clinical Impression Statement  Patient is increasing AAROM and is decreasing tenderness to palpation allowing therapist to complete passive stretching during ESTIM treatment. Patient was able to tolerate pressure on R shoulder as well as passive ROM stretching more than previous session. Pt tolerated  active flexion well in 50% availble ROM, but was unable to complete more than 25% ROM into abd w/o pain, so therapist utilized isometric abd  to begin strengthening in this  direction. Therapist will continue to progress ROM and strengthening as able.     Clinical Impairments Affecting Rehab Potential  (-) transportation, multiple comorbidities (DM II, obesity, GERD, communication barrier, sedentary lifestyle (+) positive attitude,     PT Treatment/Interventions  Neuromuscular re-education;Passive range of motion;Manual techniques;Patient/family education;Taping;Therapeutic exercise;Therapeutic activities;Functional mobility training;Moist Heat;Ultrasound;Cryotherapy;Electrical Stimulation    PT Next Visit Plan  Assess MMT as able (definitely of R UE for baseline),  Shoulder/wrist PROM/AAROM and manual for muscle spasm as tolerated, progress strengthening as tolerated    PT Home Exercise Plan  Cane assisted ER/abd/flex, scap retractions, wrist AAROM with other hand into flex/ext, desensitization to L shoulder (brushing)    Consulted and Agree with Plan of Care  Patient       Patient will benefit from skilled therapeutic intervention in order to improve the following deficits and impairments:  Increased fascial restricitons, Pain, Improper body mechanics, Increased muscle spasms, Impaired tone, Postural dysfunction, Decreased activity tolerance, Decreased endurance, Decreased range of motion, Decreased strength, Impaired flexibility  Visit Diagnosis: Stiffness of left shoulder, not elsewhere classified     Problem List Patient Active Problem List   Diagnosis Date Noted  . Lymphedema 01/17/2016  . Venous (peripheral) insufficiency 01/17/2016  . Pain in limb 01/17/2016  . Iron deficiency anemia due to chronic blood loss 01/09/2016  . History of endometrial cancer 08/01/2015  . Endometrial adenocarcinoma ([place]) 07/13/2015    Class: History of  . Inguinal lymphadenopathy 07/13/2015  . Long term  current use of insulin ([place]) 12/12/2013  . Microalbuminuria 12/12/2013  . Diabetes ([place]) 07/28/2013  . BP (high blood pressure) 07/28/2013  . HLD (hyperlipidemia) 07/28/2013  . Adiposity 07/28/2013  . Obstructive apnea 07/28/2013  . Arthritis, degenerative 07/28/2013  . Apnea, sleep 07/28/2013  . Thyroid nodule 07/28/2013    [name] PT, DPT [name] 06/17/2017, 5:58 PM   [facility] [address] [place], [address] Phone: [phone]   Fax:  [phone]  Name: Christine Lawson MRN: [id number] Date of Birth: 1960-10-02

## 2017-06-18 ENCOUNTER — Encounter: Payer: Self-pay | Admitting: Physical Therapy

## 2017-06-18 ENCOUNTER — Ambulatory Visit: Payer: Medicare Other | Admitting: Physical Therapy

## 2017-06-18 DIAGNOSIS — M25612 Stiffness of left shoulder, not elsewhere classified: Secondary | ICD-10-CM | POA: Diagnosis not present

## 2017-06-18 NOTE — Therapy (Signed)
[facility] [address], [place], [address] Phone: [phone]   Fax:  [phone]  Physical Therapy Treatment  Patient Details  Name: Christine Lawson MRN: [id number] Date of Birth: 1960/08/05 Referring Provider: Ginette Pitman   Encounter Date: 06/18/2017  PT End of Session - 06/18/17 1522    Visit Number  5    Number of Visits  17    Date for PT Re-Evaluation  07/29/17    PT Start Time  0310    PT Stop Time  0355    PT Time Calculation (min)  45 min    Activity Tolerance  Patient tolerated treatment well;Patient limited by pain    Behavior During Therapy  [facility] for tasks assessed/performed;Impulsive       Past Medical History:  Diagnosis Date  . Diabetes mellitus without complication ([place])   . Endometrial adenocarcinoma ([place])   . GERD (gastroesophageal reflux disease)   . Hematochezia   . Hyperlipidemia   . Hypertension   . IDA (iron deficiency anemia)   . Inguinal lymphadenopathy 07/13/2015  . Irritable bowel syndrome   . Obesity   . Osteoarthritis   . Sleep apnea     Past Surgical History:  Procedure Laterality Date  . ABDOMINAL HYSTERECTOMY  feb 2013  . COLONOSCOPY WITH PROPOFOL N/A 08/02/2015   Procedure: COLONOSCOPY WITH PROPOFOL;  Surgeon: Manya Silvas, MD;  Location: [facility] ENDOSCOPY;  Service: Endoscopy;  Laterality: N/A;  . ESOPHAGOGASTRODUODENOSCOPY (EGD) WITH PROPOFOL N/A 08/02/2015   Procedure: ESOPHAGOGASTRODUODENOSCOPY (EGD) WITH PROPOFOL;  Surgeon: Manya Silvas, MD;  Location: [facility] ENDOSCOPY;  Service: Endoscopy;  Laterality: N/A;    There were no vitals filed for this visit.  Subjective Assessment - 06/18/17 1518    Subjective  Patient reports she has been moving the L shoulder more over the past couple days and has been using her pulleys at home. Patient reports her pain is "a little better" today and is a 7.5/10    Pertinent History  Patient is 57 year old female that reports a  fall resulting in a chair falling onto L shoulder and wrist at the beginning of March. Patient reports that MD instructed her to wear shoulder sling as needed in community and follow up appointment Dr. Theresa Mulligan (3/27) instructed patient to wear L wrist brace for wrist sprain. Patient is scheduled for MRI 4/8 for shoulder and wrist. Patient reports worst pain in L shoulder 10/10 in past week, worst pain in L wrist 10/10. Patient reports the best her pain has been in the R shoulder in the past week: 8/10 wrist:  9/10    Limitations  House hold activities;Lifting;Writing;Sitting;Standing;Walking    How long can you sit comfortably?  Pain in all positions    How long can you stand comfortably?  Pain in all positions    How long can you walk comfortably?  Pain in all positions    Diagnostic tests  MRI scheduled 4/8    Patient Stated Goals  decrease pain    Pain Onset  1 to 4 weeks ago       Ther-Ex -Pulleys AAROM 15x abd 15x flex  - UE ranger clockwise and counter clockwise circles 2x 15 with PT assisting movement to ensure full available ROM  -Standing IR w/ yellow tband 2x 10 with TC and VC for eccentric control  -Standing ER w/ yellow tband 2x 10 w. TC and VC for proper ROM  -Standing rows w/ yellow tband  3x 10 with cuing for eccentric control and  Proper form with scapular contraction -Shoulder flexion BW w/ thumb up and thumf down 2x 10e with cuing for eccentric control    Manual -Multiple bouts of PROM into flex/abd/IR/ER with 3-5 sec holds at end range in each position, increasing range each bout                   PT Education - 06/18/17 1521    Education provided  Yes    Education Details  Exercise form    Person(s) Educated  Patient    Methods  Explanation;Demonstration;Tactile cues;Verbal cues    Comprehension  Verbalized understanding;Verbal cues required;Returned demonstration;Tactile cues required       PT Short Term Goals - 06/04/17 0938      PT SHORT TERM GOAL  #1   Title  Pt will be independent with HEP in order to improve strength and balance in order to decrease fall risk and improve function at home and work.    Time  2    Period  Weeks    Status  New    Target Date  06/18/17        PT Long Term Goals - 06/04/17 1230      PT LONG TERM GOAL #1   Title  Patient will increase FOTO score to 56 to demonstrate predicted increase in functional mobility to complete ADLs    Baseline  4/4 36    Time  8    Period  Weeks    Status  New      PT LONG TERM GOAL #2   Title  Pt will decrease worst pain as reported on NPRS by at least 3 points in order to demonstrate clinically significant reduction in pain.    Baseline  4/4: 10/10 in L shoulder and wrist    Time  8    Period  Weeks    Status  New      PT LONG TERM GOAL #3   Title  Pt will be able to open a jar to each side successfully without pain in order to complete ADLs    Baseline  4/4 unable    Time  8    Period  Weeks    Status  New      PT LONG TERM GOAL #4   Title  Patient will demonstrate L grip strength of 15kg to return to PLOF in order to complete ADLs    Baseline  4/4 8kg    Time  8    Period  Weeks    Status  New            Plan - 06/18/17 1630    Clinical Impression Statement  Patient is continuing to increase AAROM and is able to tolerate light resistance exercises today. Patient is continuing to exhibit severe muscle gaurding with PROM, but motion is slowly improving, and patient is able to tolerate more PROM with further end range each session. Patient is encouraged to continue using Pulleys at home and mobilizing shoulder as much as possible.     Clinical Impairments Affecting Rehab Potential  (-) transportation, multiple comorbidities (DM II, obesity, GERD, communication barrier, sedentary lifestyle (+) positive attitude,     PT Frequency  2x / week    PT Duration  8 weeks    PT Treatment/Interventions  Neuromuscular re-education;Passive range of motion;Manual  techniques;Patient/family education;Taping;Therapeutic exercise;Therapeutic activities;Functional mobility training;Moist Heat;Ultrasound;Cryotherapy;Electrical Stimulation    PT Next Visit  Plan  Assess MMT as able (definitely of R UE for baseline),  Shoulder/wrist PROM/AAROM and manual for muscle spasm as tolerated, progress strengthening as tolerated    PT Home Exercise Plan  Cane assisted ER/abd/flex, scap retractions, wrist AAROM with other hand into flex/ext, desensitization to L shoulder (brushing)    Consulted and Agree with Plan of Care  Patient       Patient will benefit from skilled therapeutic intervention in order to improve the following deficits and impairments:  Increased fascial restricitons, Pain, Improper body mechanics, Increased muscle spasms, Impaired tone, Postural dysfunction, Decreased activity tolerance, Decreased endurance, Decreased range of motion, Decreased strength, Impaired flexibility  Visit Diagnosis: Stiffness of left shoulder, not elsewhere classified     Problem List Patient Active Problem List   Diagnosis Date Noted  . Lymphedema 01/17/2016  . Venous (peripheral) insufficiency 01/17/2016  . Pain in limb 01/17/2016  . Iron deficiency anemia due to chronic blood loss 01/09/2016  . History of endometrial cancer 08/01/2015  . Endometrial adenocarcinoma ([place]) 07/13/2015    Class: History of  . Inguinal lymphadenopathy 07/13/2015  . Long term current use of insulin ([place]) 12/12/2013  . Microalbuminuria 12/12/2013  . Diabetes ([place]) 07/28/2013  . BP (high blood pressure) 07/28/2013  . HLD (hyperlipidemia) 07/28/2013  . Adiposity 07/28/2013  . Obstructive apnea 07/28/2013  . Arthritis, degenerative 07/28/2013  . Apnea, sleep 07/28/2013  . Thyroid nodule 07/28/2013   [name] PT, DPT [name] 06/18/2017, 4:34 PM  [facility] [address] [place], [address] Phone:  [phone]   Fax:  [phone]  Name: Christine Lawson MRN: [id number] Date of Birth: [birth year]

## 2017-06-22 ENCOUNTER — Encounter: Payer: Self-pay | Admitting: Physical Therapy

## 2017-06-22 ENCOUNTER — Ambulatory Visit: Payer: Medicare Other | Admitting: Physical Therapy

## 2017-06-22 DIAGNOSIS — M25612 Stiffness of left shoulder, not elsewhere classified: Secondary | ICD-10-CM | POA: Diagnosis not present

## 2017-06-22 NOTE — Therapy (Signed)
[place] [facility] [address], [place], [address] Phone: [phone]   Fax:  [phone]  Physical Therapy Treatment  Patient Details  Name: Christine Lawson MRN: [id number] Date of Birth: [birth year] Referring Provider: Ginette Pitman   Encounter Date: 06/22/2017  PT End of Session - 06/22/17 1538    Visit Number  6    Number of Visits  17    Date for PT Re-Evaluation  07/29/17    PT Start Time  0314    PT Stop Time  0400    PT Time Calculation (min)  46 min    Activity Tolerance  Patient tolerated treatment well;Patient limited by pain    Behavior During Therapy  [facility] for tasks assessed/performed;Impulsive       Past Medical History:  Diagnosis Date  . Diabetes mellitus without complication ([place])   . Endometrial adenocarcinoma ([place])   . GERD (gastroesophageal reflux disease)   . Hematochezia   . Hyperlipidemia   . Hypertension   . IDA (iron deficiency anemia)   . Inguinal lymphadenopathy 07/13/2015  . Irritable bowel syndrome   . Obesity   . Osteoarthritis   . Sleep apnea     Past Surgical History:  Procedure Laterality Date  . ABDOMINAL HYSTERECTOMY  feb 2013  . COLONOSCOPY WITH PROPOFOL N/A 08/02/2015   Procedure: COLONOSCOPY WITH PROPOFOL;  Surgeon: Manya Silvas, MD;  Location: [facility] ENDOSCOPY;  Service: Endoscopy;  Laterality: N/A;  . ESOPHAGOGASTRODUODENOSCOPY (EGD) WITH PROPOFOL N/A 08/02/2015   Procedure: ESOPHAGOGASTRODUODENOSCOPY (EGD) WITH PROPOFOL;  Surgeon: Manya Silvas, MD;  Location: [facility] ENDOSCOPY;  Service: Endoscopy;  Laterality: N/A;    There were no vitals filed for this visit.  Subjective Assessment - 06/22/17 1521    Subjective  Patient reports 7/10 today. Patient reports she has been "trying to comlete her HEP". Patient reports she was able to help complete Easter cooking this weekend, but was very sore after.     Pertinent History  Patient is 57 year old female that reports a  fall resulting in a chair falling onto L shoulder and wrist at the beginning of March. Patient reports that MD instructed her to wear shoulder sling as needed in community and follow up appointment Dr. Theresa Mulligan (3/27) instructed patient to wear L wrist brace for wrist sprain. Patient is scheduled for MRI 4/8 for shoulder and wrist. Patient reports worst pain in L shoulder 10/10 in past week, worst pain in L wrist 10/10. Patient reports the best her pain has been in the R shoulder in the past week: 8/10 wrist:  9/10    Limitations  House hold activities;Lifting;Writing;Sitting;Standing;Walking    How long can you sit comfortably?  Pain in all positions    How long can you stand comfortably?  Pain in all positions    How long can you walk comfortably?  Pain in all positions    Diagnostic tests  MRI scheduled 4/8    Patient Stated Goals  decrease pain    Pain Onset  1 to 4 weeks ago          Ther-Ex -Pulleys AAROM 20x flex 20x abd -Table slide abd 20x 3-5 sec holds -Cane assisted ER 20x w/ 3-5 sec holds -Towel assisted IR 20x w/ 3-5 sec holds w/ PT assistance needed for proper form -Red tband ER 3x 10 w/ VC and TC for shoulder movement only without trunk rotation and eccentric control -Red tband IR 3x 10 w/ VC and  TC for shoulder movement only without trunk rotation and eccentric control     ESTIM + heat pack HiVolt ESTIM 15 min at patient tolerated 50V increased to 65V through treatment at R tricep and lateral shoulder . Attempted d/t success of treatment at previous session success. With PT assessing patient tolerance throughout (increasing intensity as needed), monitoring skin integrity (normal), with decreased pain noted from patient    Manual -Multiple bouts of PROM into end range abd/flex/IR/ER with 3-5 sec holds; increasing ROM each bout; with most limitation in ER>IR>abd with                   PT Education - 06/22/17 1533    Education provided  Yes    Education Details   Exercise form    Person(s) Educated  Patient    Methods  Explanation;Tactile cues;Verbal cues;Demonstration    Comprehension  Verbal cues required;Returned demonstration;Verbalized understanding;Tactile cues required       PT Short Term Goals - 06/04/17 0938      PT SHORT TERM GOAL #1   Title  Pt will be independent with HEP in order to improve strength and balance in order to decrease fall risk and improve function at home and work.    Time  2    Period  Weeks    Status  New    Target Date  06/18/17        PT Long Term Goals - 06/04/17 1230      PT LONG TERM GOAL #1   Title  Patient will increase FOTO score to 56 to demonstrate predicted increase in functional mobility to complete ADLs    Baseline  4/4 36    Time  8    Period  Weeks    Status  New      PT LONG TERM GOAL #2   Title  Pt will decrease worst pain as reported on NPRS by at least 3 points in order to demonstrate clinically significant reduction in pain.    Baseline  4/4: 10/10 in L shoulder and wrist    Time  8    Period  Weeks    Status  New      PT LONG TERM GOAL #3   Title  Pt will be able to open a jar to each side successfully without pain in order to complete ADLs    Baseline  4/4 unable    Time  8    Period  Weeks    Status  New      PT LONG TERM GOAL #4   Title  Patient will demonstrate L grip strength of 15kg to return to PLOF in order to complete ADLs    Baseline  4/4 8kg    Time  8    Period  Weeks    Status  New            Plan - 06/22/17 1556    Clinical Impression Statement  Patient is demonstrating better tolerance to PROM and AAROM treatment. Patient is exhibiting PROM limitations consistant with adhesive capsulitis ER>abd>IR. PT educated patient on the importance of continuing HEP to prevent further progression and to continue AAROM HEP in all directions; patient verbalized understanding.     Clinical Impairments Affecting Rehab Potential  (-) transportation, multiple  comorbidities (DM II, obesity, GERD, communication barrier, sedentary lifestyle (+) positive attitude,     PT Frequency  2x / week    PT Duration  8 weeks  PT Treatment/Interventions  Neuromuscular re-education;Passive range of motion;Manual techniques;Patient/family education;Taping;Therapeutic exercise;Therapeutic activities;Functional mobility training;Moist Heat;Ultrasound;Cryotherapy;Electrical Stimulation    PT Next Visit Plan  Assess MMT as able (definitely of R UE for baseline),  Shoulder/wrist PROM/AAROM and manual for muscle spasm as tolerated, progress strengthening as tolerated    PT Home Exercise Plan  Cane assisted ER/abd/flex, scap retractions, wrist AAROM with other hand into flex/ext, desensitization to L shoulder (brushing)    Consulted and Agree with Plan of Care  Patient       Patient will benefit from skilled therapeutic intervention in order to improve the following deficits and impairments:  Increased fascial restricitons, Pain, Improper body mechanics, Increased muscle spasms, Impaired tone, Postural dysfunction, Decreased activity tolerance, Decreased endurance, Decreased range of motion, Decreased strength, Impaired flexibility  Visit Diagnosis: Stiffness of left shoulder, not elsewhere classified     Problem List Patient Active Problem List   Diagnosis Date Noted  . Lymphedema 01/17/2016  . Venous (peripheral) insufficiency 01/17/2016  . Pain in limb 01/17/2016  . Iron deficiency anemia due to chronic blood loss 01/09/2016  . History of endometrial cancer 08/01/2015  . Endometrial adenocarcinoma ([place]) 07/13/2015    Class: History of  . Inguinal lymphadenopathy 07/13/2015  . Long term current use of insulin ([place]) 12/12/2013  . Microalbuminuria 12/12/2013  . Diabetes ([place]) 07/28/2013  . BP (high blood pressure) 07/28/2013  . HLD (hyperlipidemia) 07/28/2013  . Adiposity 07/28/2013  . Obstructive apnea 07/28/2013  . Arthritis, degenerative 07/28/2013  .  Apnea, sleep 07/28/2013  . Thyroid nodule 07/28/2013   [name] PT, DPT [name] 06/22/2017, 4:01 PM  [place] [facility] [address] [place], [address] Phone: [phone]   Fax:  [phone]  Name: Alliana Mcauliff MRN: [id number] Date of Birth: 1960/06/13

## 2017-06-23 ENCOUNTER — Ambulatory Visit: Admission: RE | Admit: 2017-06-23 | Payer: Medicare Other | Source: Ambulatory Visit

## 2017-06-24 ENCOUNTER — Encounter: Payer: Self-pay | Admitting: Physical Therapy

## 2017-06-24 ENCOUNTER — Ambulatory Visit: Payer: Medicare Other | Admitting: Physical Therapy

## 2017-06-24 DIAGNOSIS — M25612 Stiffness of left shoulder, not elsewhere classified: Secondary | ICD-10-CM | POA: Diagnosis not present

## 2017-06-24 NOTE — Therapy (Signed)
[place] [facility] [address] [place], [address] Phone: [phone]   Fax:  [phone]  Physical Therapy Treatment  Patient Details  Name: Christine Lawson MRN: [id number] Date of Birth: [birth year] Referring Provider: Ginette Pitman   Encounter Date: 06/24/2017  PT End of Session - 06/24/17 1628    Visit Number  7    Number of Visits  17    Date for PT Re-Evaluation  07/29/17    PT Start Time  0400    PT Stop Time  0445    PT Time Calculation (min)  45 min    Activity Tolerance  Patient tolerated treatment well;Patient limited by pain    Behavior During Therapy  [facility] for tasks assessed/performed;Impulsive       Past Medical History:  Diagnosis Date  . Diabetes mellitus without complication ([place])   . Endometrial adenocarcinoma ([place])   . GERD (gastroesophageal reflux disease)   . Hematochezia   . Hyperlipidemia   . Hypertension   . IDA (iron deficiency anemia)   . Inguinal lymphadenopathy 07/13/2015  . Irritable bowel syndrome   . Obesity   . Osteoarthritis   . Sleep apnea     Past Surgical History:  Procedure Laterality Date  . ABDOMINAL HYSTERECTOMY  feb 2013  . COLONOSCOPY WITH PROPOFOL N/A 08/02/2015   Procedure: COLONOSCOPY WITH PROPOFOL;  Surgeon: Manya Silvas, MD;  Location: [facility] ENDOSCOPY;  Service: Endoscopy;  Laterality: N/A;  . ESOPHAGOGASTRODUODENOSCOPY (EGD) WITH PROPOFOL N/A 08/02/2015   Procedure: ESOPHAGOGASTRODUODENOSCOPY (EGD) WITH PROPOFOL;  Surgeon: Manya Silvas, MD;  Location: [facility] ENDOSCOPY;  Service: Endoscopy;  Laterality: N/A;    There were no vitals filed for this visit.  Subjective Assessment - 06/24/17 1606    Subjective  Patient reports 7/10 pain today, but she reports it is "getting better". Patient reports that she attempted an MRI yesterday, but had a lot of anxiety and was unable to complete MRI. Patient reports that she has an appt with her PCP Monday to discuss if she  can be medicated for MRI. PT asked patient if she takes her DM medication as prescribed and she reports "most of the time.     Pertinent History  Patient is 57 year old female that reports a fall resulting in a chair falling onto L shoulder and wrist at the beginning of March. Patient reports that MD instructed her to wear shoulder sling as needed in community and follow up appointment Dr. Theresa Mulligan (3/27) instructed patient to wear L wrist brace for wrist sprain. Patient is scheduled for MRI 4/8 for shoulder and wrist. Patient reports worst pain in L shoulder 10/10 in past week, worst pain in L wrist 10/10. Patient reports the best her pain has been in the R shoulder in the past week: 8/10 wrist:  9/10    Limitations  House hold activities;Lifting;Writing;Sitting;Standing;Walking    How long can you sit comfortably?  Pain in all positions    How long can you stand comfortably?  Pain in all positions    How long can you walk comfortably?  Pain in all positions    Diagnostic tests  MRI scheduled 4/8    Patient Stated Goals  decrease pain    Pain Onset  1 to 4 weeks ago    Pain Onset  1 to 4 weeks ago            Ther-Ex -Pulleys 20x flex 20x abd -UE ranger counter clockwise and clockwise  circles 2x 12each direction with TC from PT for full available ROM and to not use trunk movement compensation -Cane assisted ER 2x 15 with PT cuing for proper form with TC needed for proper elbow positioning -Towel assisted IR 2x 15 with PT TC for proper form and utilization of of R UE for assistance. -UE ranger assisted abd x 10 w/ PT TC to step toward wall to elicit further assisted abd     ESTIM + heat pack HiVolt ESTIM 13 min at patient tolerated 85V increased to 95V through treatment at L lateral shoulder and UT area . Attempted d/t success of treatment at previous session success. With PT assessing patient tolerance throughout (increasing intensity as needed), monitoring skin integrity (normal), with  decreased pain noted from patient.   Manual For 8 mins during ESTIM PT assisted multiple bouts of PROM with 2-3 sec holds at end range, increasing range each bout. Patient is demonstrating much more PROM and better tolerance to manual PROM. Measurements taken below    PROM measurements Flex 146d  Abd 115d ER 79d IR 71d               PT Education - 06/24/17 1628    Education provided  Yes    Education Details  Exercise form    Person(s) Educated  Patient    Methods  Explanation;Tactile cues;Verbal cues;Demonstration    Comprehension  Verbal cues required;Returned demonstration;Verbalized understanding;Tactile cues required;Need further instruction       PT Short Term Goals - 06/04/17 1017      PT SHORT TERM GOAL #1   Title  Pt will be independent with HEP in order to improve strength and balance in order to decrease fall risk and improve function at home and work.    Time  2    Period  Weeks    Status  New    Target Date  06/18/17        PT Long Term Goals - 06/04/17 1230      PT LONG TERM GOAL #1   Title  Patient will increase FOTO score to 56 to demonstrate predicted increase in functional mobility to complete ADLs    Baseline  4/4 36    Time  8    Period  Weeks    Status  New      PT LONG TERM GOAL #2   Title  Pt will decrease worst pain as reported on NPRS by at least 3 points in order to demonstrate clinically significant reduction in pain.    Baseline  4/4: 10/10 in L shoulder and wrist    Time  8    Period  Weeks    Status  New      PT LONG TERM GOAL #3   Title  Pt will be able to open a jar to each side successfully without pain in order to complete ADLs    Baseline  4/4 unable    Time  8    Period  Weeks    Status  New      PT LONG TERM GOAL #4   Title  Patient will demonstrate L grip strength of 15kg to return to PLOF in order to complete ADLs    Baseline  4/4 8kg    Time  8    Period  Weeks    Status  New            Plan -  06/24/17 1646    Clinical Impression  Statement  Patient is demonstrating better ROM as evidenced by measurements taken by PT today. PT has more success with decreased pain response completing manual techniques with ESTIM. PT will continue to work toward regaining ROM .     Rehab Potential  Fair    Clinical Impairments Affecting Rehab Potential  (-) transportation, multiple comorbidities (DM II, obesity, GERD, communication barrier, sedentary lifestyle (+) positive attitude,     PT Frequency  2x / week    PT Duration  8 weeks    PT Treatment/Interventions  Neuromuscular re-education;Passive range of motion;Manual techniques;Patient/family education;Taping;Therapeutic exercise;Therapeutic activities;Functional mobility training;Moist Heat;Ultrasound;Cryotherapy;Electrical Stimulation    PT Next Visit Plan  Continue AAROM/PROM, assess AROM, begin isometrics as tolerate.     PT Home Exercise Plan  Cane assisted ER/abd/flex, scap retractions, wrist AAROM with other hand into flex/ext, desensitization to L shoulder (brushing)    Consulted and Agree with Plan of Care  Patient       Patient will benefit from skilled therapeutic intervention in order to improve the following deficits and impairments:  Increased fascial restricitons, Pain, Improper body mechanics, Increased muscle spasms, Impaired tone, Postural dysfunction, Decreased activity tolerance, Decreased endurance, Decreased range of motion, Decreased strength, Impaired flexibility  Visit Diagnosis: Stiffness of left shoulder, not elsewhere classified     Problem List Patient Active Problem List   Diagnosis Date Noted  . Lymphedema 01/17/2016  . Venous (peripheral) insufficiency 01/17/2016  . Pain in limb 01/17/2016  . Iron deficiency anemia due to chronic blood loss 01/09/2016  . History of endometrial cancer 08/01/2015  . Endometrial adenocarcinoma ([place]) 07/13/2015    Class: History of  . Inguinal lymphadenopathy 07/13/2015  . Long  term current use of insulin ([place]) 12/12/2013  . Microalbuminuria 12/12/2013  . Diabetes ([place]) 07/28/2013  . BP (high blood pressure) 07/28/2013  . HLD (hyperlipidemia) 07/28/2013  . Adiposity 07/28/2013  . Obstructive apnea 07/28/2013  . Arthritis, degenerative 07/28/2013  . Apnea, sleep 07/28/2013  . Thyroid nodule 07/28/2013   [name] PT, DPT [name] 06/24/2017, 4:54 PM  [place] [facility] [address] [place], [address] Phone: [phone]   Fax:  [phone]  Name: Christine Lawson MRN: [id number] Date of Birth: February 23, 1961

## 2017-06-29 ENCOUNTER — Ambulatory Visit: Payer: Medicare Other | Admitting: Physical Therapy

## 2017-06-29 ENCOUNTER — Encounter: Payer: Self-pay | Admitting: Physical Therapy

## 2017-06-29 ENCOUNTER — Ambulatory Visit: Admission: RE | Admit: 2017-06-29 | Payer: Medicare Other | Source: Ambulatory Visit

## 2017-06-29 ENCOUNTER — Other Ambulatory Visit: Payer: Self-pay | Admitting: Internal Medicine

## 2017-06-29 DIAGNOSIS — M25612 Stiffness of left shoulder, not elsewhere classified: Secondary | ICD-10-CM

## 2017-06-29 DIAGNOSIS — M79604 Pain in right leg: Secondary | ICD-10-CM

## 2017-06-29 NOTE — Therapy (Signed)
[place] [facility] [address], [place], [address] Phone: [phone]   Fax:  [phone]  Physical Therapy Treatment  Patient Details  Name: Christine Lawson MRN: [id number] Date of Birth: Sep 16, 1960 Referring Provider: Ginette Pitman   Encounter Date: 06/29/2017  PT End of Session - 06/29/17 1621    Visit Number  8    Number of Visits  17    Date for PT Re-Evaluation  07/29/17    PT Start Time  0400    PT Stop Time  0445    PT Time Calculation (min)  45 min    Activity Tolerance  Patient tolerated treatment well;Patient limited by pain    Behavior During Therapy  [facility] for tasks assessed/performed;Impulsive       Past Medical History:  Diagnosis Date  . Diabetes mellitus without complication ([place])   . Endometrial adenocarcinoma ([place])   . GERD (gastroesophageal reflux disease)   . Hematochezia   . Hyperlipidemia   . Hypertension   . IDA (iron deficiency anemia)   . Inguinal lymphadenopathy 07/13/2015  . Irritable bowel syndrome   . Obesity   . Osteoarthritis   . Sleep apnea     Past Surgical History:  Procedure Laterality Date  . ABDOMINAL HYSTERECTOMY  feb 2013  . COLONOSCOPY WITH PROPOFOL N/A 08/02/2015   Procedure: COLONOSCOPY WITH PROPOFOL;  Surgeon: Manya Silvas, MD;  Location: [facility] ENDOSCOPY;  Service: Endoscopy;  Laterality: N/A;  . ESOPHAGOGASTRODUODENOSCOPY (EGD) WITH PROPOFOL N/A 08/02/2015   Procedure: ESOPHAGOGASTRODUODENOSCOPY (EGD) WITH PROPOFOL;  Surgeon: Manya Silvas, MD;  Location: [facility] ENDOSCOPY;  Service: Endoscopy;  Laterality: N/A;    There were no vitals filed for this visit.  Subjective Assessment - 06/29/17 1605    Subjective  Patient reports 7/10 pain today, and reports she has been using the shoulder more. Patient reports she went to her PCP today who she reports is pleased with her progress and wants to hold off on the MRI since she is having some shoulder improvement. Patient  reports she has OT referral for wrist/hand scheduled next week. Patient reports she thinks ESTIM machine is helpful for her pain to allow her to do her exercises at hom    Pertinent History  Patient is 57 year old female that reports a fall resulting in a chair falling onto L shoulder and wrist at the beginning of March. Patient reports that MD instructed her to wear shoulder sling as needed in community and follow up appointment Dr. Theresa Mulligan (3/27) instructed patient to wear L wrist brace for wrist sprain. Patient is scheduled for MRI 4/8 for shoulder and wrist. Patient reports worst pain in L shoulder 10/10 in past week, worst pain in L wrist 10/10. Patient reports the best her pain has been in the R shoulder in the past week: 8/10 wrist:  9/10    Limitations  House hold activities;Lifting;Writing;Sitting;Standing;Walking    How long can you sit comfortably?  Pain in all positions    How long can you stand comfortably?  Pain in all positions    How long can you walk comfortably?  Pain in all positions    Diagnostic tests  MRI scheduled 4/8    Patient Stated Goals  decrease pain    Pain Onset  1 to 4 weeks ago          Ther-Ex -Pulleys 2 min flexion 2 min abduction for warmup during hx intake  -Clockwise and Counterclockwise 2x 15e with  cuing for full ROM  -Standing rows 3x 10 with continuous cuing for proper form and  -Shoulder IR red tband 3x 10 with cuing to prevent torso rotation and eccentric control  -Shoulder ER red tband 3x 10 with cuing to prevent torso rotation  -Shoulder flex and abd 2x 8/6each -Seated lat stretch 2x 30sec hold (added to HEP    Manual -Multiple bouts of PROM into flex, abd, IR, and ER with 3-5sec holds at end range, increasing end range each time -STM w/ trigger point release to L UT -through flexion PROM (at the end of session) PT assessed patient L latissimus and noted tightness and trigger points; PT attempted [facility] w/ trigger point release to this area, and  patient could only tolerate slight pressure at this area     ESTIM + heat pack HiVolt ESTIM 15 min at patient tolerated 80V increased to 85V through treatment at L UT area . Attempted d/t success of treatment at previous session success. With PT assessing patient tolerance throughout (increasing intensity as needed), monitoring skin integrity (normal), with decreased pain noted from patient. PT utilized majority of ESTIM time with PROM manual techniques, as patient tolerates this better with combination of modality                PT Education - 06/29/17 1615    Education provided  Yes    Education Details  Exercise form    Person(s) Educated  Patient    Methods  Explanation;Tactile cues;Verbal cues    Comprehension  Verbal cues required;Verbalized understanding;Tactile cues required       PT Short Term Goals - 06/04/17 0938      PT SHORT TERM GOAL #1   Title  Pt will be independent with HEP in order to improve strength and balance in order to decrease fall risk and improve function at home and work.    Time  2    Period  Weeks    Status  New    Target Date  06/18/17        PT Long Term Goals - 06/04/17 1230      PT LONG TERM GOAL #1   Title  Patient will increase FOTO score to 56 to demonstrate predicted increase in functional mobility to complete ADLs    Baseline  4/4 36    Time  8    Period  Weeks    Status  New      PT LONG TERM GOAL #2   Title  Pt will decrease worst pain as reported on NPRS by at least 3 points in order to demonstrate clinically significant reduction in pain.    Baseline  4/4: 10/10 in L shoulder and wrist    Time  8    Period  Weeks    Status  New      PT LONG TERM GOAL #3   Title  Pt will be able to open a jar to each side successfully without pain in order to complete ADLs    Baseline  4/4 unable    Time  8    Period  Weeks    Status  New      PT LONG TERM GOAL #4   Title  Patient will demonstrate L grip strength of 15kg to  return to PLOF in order to complete ADLs    Baseline  4/4 8kg    Time  8    Period  Weeks    Status  New  Plan - 06/29/17 1624    Clinical Impression Statement  Patient is continuing to demonstrate increased L shoulder PROM and AROM allowing for PT to introduce more resistance training. Patient is able to complete resistance exercises with no increased pain, only noted muscle fatigue, with continuous PT TC and VC to ensure proper form and muscle activation. As patient is demonstrating less muscle gaurding, PT is able to progress PROM and was able to assess soft tissue restriction, noting increased restriction in L UT (noted previously) and L latissimus with passive flexion. Patient demonstrates severe tenderness to L latissimus STM and has increased trigger points in this area. PT was able to resolve tenderness in this area 50% and gave patient stretch to continue at home (demonstrated in the clinic) to continue to lengthen this muscle.     Rehab Potential  Fair    Clinical Impairments Affecting Rehab Potential  (-) transportation, multiple comorbidities (DM II, obesity, GERD, communication barrier, sedentary lifestyle (+) positive attitude,     PT Frequency  2x / week    PT Duration  8 weeks    PT Treatment/Interventions  Neuromuscular re-education;Passive range of motion;Manual techniques;Patient/family education;Taping;Therapeutic exercise;Therapeutic activities;Functional mobility training;Moist Heat;Ultrasound;Cryotherapy;Electrical Stimulation    PT Next Visit Plan  Continue AAROM/PROM, assess AROM, begin isometrics as tolerate.     PT Home Exercise Plan  Cane assisted ER/abd/flex, scap retractions, wrist AAROM with other hand into flex/ext, desensitization to L shoulder (brushing)    Consulted and Agree with Plan of Care  Patient       Patient will benefit from skilled therapeutic intervention in order to improve the following deficits and impairments:  Increased fascial  restricitons, Pain, Improper body mechanics, Increased muscle spasms, Impaired tone, Postural dysfunction, Decreased activity tolerance, Decreased endurance, Decreased range of motion, Decreased strength, Impaired flexibility  Visit Diagnosis: Stiffness of left shoulder, not elsewhere classified     Problem List Patient Active Problem List   Diagnosis Date Noted  . Lymphedema 01/17/2016  . Venous (peripheral) insufficiency 01/17/2016  . Pain in limb 01/17/2016  . Iron deficiency anemia due to chronic blood loss 01/09/2016  . History of endometrial cancer 08/01/2015  . Endometrial adenocarcinoma ([place]) 07/13/2015    Class: History of  . Inguinal lymphadenopathy 07/13/2015  . Long term current use of insulin ([place]) 12/12/2013  . Microalbuminuria 12/12/2013  . Diabetes ([place]) 07/28/2013  . BP (high blood pressure) 07/28/2013  . HLD (hyperlipidemia) 07/28/2013  . Adiposity 07/28/2013  . Obstructive apnea 07/28/2013  . Arthritis, degenerative 07/28/2013  . Apnea, sleep 07/28/2013  . Thyroid nodule 07/28/2013   [name] PT, DPT [name] 06/29/2017, 5:01 PM  [place] [facility] [address], [place], [address] Phone: [phone]   Fax:  [phone]  Name: Christine Lawson MRN: [id number] Date of Birth: 17-Sep-1960

## 2017-07-01 ENCOUNTER — Encounter: Payer: Self-pay | Admitting: Physical Therapy

## 2017-07-01 ENCOUNTER — Ambulatory Visit: Payer: Medicare Other | Attending: Internal Medicine | Admitting: Physical Therapy

## 2017-07-01 DIAGNOSIS — M25512 Pain in left shoulder: Secondary | ICD-10-CM | POA: Diagnosis present

## 2017-07-01 DIAGNOSIS — M79642 Pain in left hand: Secondary | ICD-10-CM | POA: Diagnosis present

## 2017-07-01 DIAGNOSIS — M6281 Muscle weakness (generalized): Secondary | ICD-10-CM | POA: Diagnosis present

## 2017-07-01 DIAGNOSIS — M25612 Stiffness of left shoulder, not elsewhere classified: Secondary | ICD-10-CM | POA: Diagnosis not present

## 2017-07-01 DIAGNOSIS — M25532 Pain in left wrist: Secondary | ICD-10-CM | POA: Insufficient documentation

## 2017-07-01 DIAGNOSIS — M25632 Stiffness of left wrist, not elsewhere classified: Secondary | ICD-10-CM | POA: Insufficient documentation

## 2017-07-01 DIAGNOSIS — G8929 Other chronic pain: Secondary | ICD-10-CM | POA: Diagnosis present

## 2017-07-01 NOTE — Therapy (Signed)
[place] [address], [place], [address] Phone: [phone]   Fax:  [phone]  Physical Therapy Treatment  Patient Details  Name: Christine Lawson MRN: [id number] Date of Birth: 12-17-1960 Referring Provider: Ginette Pitman   Encounter Date: 07/01/2017  PT End of Session - 07/01/17 1457    Visit Number  9    Number of Visits  17    Date for PT Re-Evaluation  07/29/17    PT Start Time  0230    PT Stop Time  0315    PT Time Calculation (min)  45 min    Activity Tolerance  Patient tolerated treatment well;Patient limited by pain    Behavior During Therapy  [facility] for tasks assessed/performed;Impulsive       Past Medical History:  Diagnosis Date  . Diabetes mellitus without complication ([place])   . Endometrial adenocarcinoma ([place])   . GERD (gastroesophageal reflux disease)   . Hematochezia   . Hyperlipidemia   . Hypertension   . IDA (iron deficiency anemia)   . Inguinal lymphadenopathy 07/13/2015  . Irritable bowel syndrome   . Obesity   . Osteoarthritis   . Sleep apnea     Past Surgical History:  Procedure Laterality Date  . ABDOMINAL HYSTERECTOMY  feb 2013  . COLONOSCOPY WITH PROPOFOL N/A 08/02/2015   Procedure: COLONOSCOPY WITH PROPOFOL;  Surgeon: Manya Silvas, MD;  Location: [facility] ENDOSCOPY;  Service: Endoscopy;  Laterality: N/A;  . ESOPHAGOGASTRODUODENOSCOPY (EGD) WITH PROPOFOL N/A 08/02/2015   Procedure: ESOPHAGOGASTRODUODENOSCOPY (EGD) WITH PROPOFOL;  Surgeon: Manya Silvas, MD;  Location: [facility] ENDOSCOPY;  Service: Endoscopy;  Laterality: N/A;    There were no vitals filed for this visit.  Subjective Assessment - 07/01/17 1435    Subjective  Patient reports her pain is elevated today to 7/10 because someone "bumped her shoulder" earlier. Patient reports she has been doing her HEP and thinks it is helping.     Pertinent History  Patient is 57 year old female that reports a fall resulting in a  chair falling onto L shoulder and wrist at the beginning of March. Patient reports that MD instructed her to wear shoulder sling as needed in community and follow up appointment Dr. Theresa Mulligan (3/27) instructed patient to wear L wrist brace for wrist sprain. Patient is scheduled for MRI 4/8 for shoulder and wrist. Patient reports worst pain in L shoulder 10/10 in past week, worst pain in L wrist 10/10. Patient reports the best her pain has been in the R shoulder in the past week: 8/10 wrist:  9/10    Limitations  House hold activities;Lifting;Writing;Sitting;Standing;Walking    How long can you sit comfortably?  Pain in all positions    How long can you stand comfortably?  Pain in all positions    How long can you walk comfortably?  Pain in all positions    Diagnostic tests  MRI scheduled 4/8    Patient Stated Goals  decrease pain    Pain Onset  1 to 4 weeks ago         Ther-Ex -Pulleys 2 min flex 2 min abd for warmup during hx intake (unbilled) -Standing rows 10# 3x 10 with TC for scapular "squeeze" and VC for eccentric control -Tricep extension 10# 3x 12 w/ TC for proper form -Empty can abduction 2x 10 with TC to prevent "lean" compensation, and shoulder hiking, and VC for eccentric control   -Full can abduction 2x 10 with cuing and  simultaneous demonstration for eccentric control    Manual -PROM into flex/abd/IR/ER holding 3-5sec at end range, increasing end range each bout  with measurements at the end of bouts: Flex-  134d abd-  136d ER- 80d IR- 71d  -STM and trigger point release to L latissimus and bicep with decreased tenderness and tightness noted after       ESTIM + heat pack HiVolt ESTIM 15 min at patient tolerated 75V at L bicep/ant delt and 70V at L lattissimus . Attempted d/t success of treatment at previous session success. With PT assessing patient tolerance throughout , monitoring skin integrity (normal), with decreased pain noted from patient. Utilized this time for manual  techniques                  PT Education - 07/01/17 1455    Education provided  Yes    Education Details  Exercise form    Person(s) Educated  Patient    Methods  Explanation;Demonstration;Tactile cues;Verbal cues    Comprehension  Verbalized understanding;Returned demonstration;Verbal cues required;Tactile cues required       PT Short Term Goals - 06/04/17 0938      PT SHORT TERM GOAL #1   Title  Pt will be independent with HEP in order to improve strength and balance in order to decrease fall risk and improve function at home and work.    Time  2    Period  Weeks    Status  New    Target Date  06/18/17        PT Long Term Goals - 06/04/17 1230      PT LONG TERM GOAL #1   Title  Patient will increase FOTO score to 56 to demonstrate predicted increase in functional mobility to complete ADLs    Baseline  4/4 36    Time  8    Period  Weeks    Status  New      PT LONG TERM GOAL #2   Title  Pt will decrease worst pain as reported on NPRS by at least 3 points in order to demonstrate clinically significant reduction in pain.    Baseline  4/4: 10/10 in L shoulder and wrist    Time  8    Period  Weeks    Status  New      PT LONG TERM GOAL #3   Title  Pt will be able to open a jar to each side successfully without pain in order to complete ADLs    Baseline  4/4 unable    Time  8    Period  Weeks    Status  New      PT LONG TERM GOAL #4   Title  Patient will demonstrate L grip strength of 15kg to return to PLOF in order to complete ADLs    Baseline  4/4 8kg    Time  8    Period  Weeks    Status  New            Plan - 07/01/17 1516    Clinical Impression Statement  Patient is continuing to demonstrate increased PROM and AROM. PT focused more on resistance training today as patient is nearing full AROM/PROM, which patient tolerated well with no increased pain, only consistent cuing needed for proper form in the inital reps, but pt was able to  demonstrate better carry over today. Patient continued to demonstrate gaurding with end range PROM with noted tightness and trigger  points at L lat and biceps. PT attempted HiVolt ESTIM at both areas d/t success in the past and utilized manual techniques at this area. Patient reported pain relief following modality and manual treatment.     Clinical Impairments Affecting Rehab Potential  (-) transportation, multiple comorbidities (DM II, obesity, GERD, communication barrier, sedentary lifestyle (+) positive attitude,     PT Frequency  2x / week    PT Duration  8 weeks    PT Treatment/Interventions  Neuromuscular re-education;Passive range of motion;Manual techniques;Patient/family education;Taping;Therapeutic exercise;Therapeutic activities;Functional mobility training;Moist Heat;Ultrasound;Cryotherapy;Electrical Stimulation    PT Next Visit Plan  Continue AAROM/PROM, assess AROM, begin isometrics as tolerate.     PT Home Exercise Plan  Cane assisted ER/abd/flex, scap retractions, wrist AAROM with other hand into flex/ext, desensitization to L shoulder (brushing)    Consulted and Agree with Plan of Care  Patient       Patient will benefit from skilled therapeutic intervention in order to improve the following deficits and impairments:  Increased fascial restricitons, Pain, Improper body mechanics, Increased muscle spasms, Impaired tone, Postural dysfunction, Decreased activity tolerance, Decreased endurance, Decreased range of motion, Decreased strength, Impaired flexibility  Visit Diagnosis: Stiffness of left shoulder, not elsewhere classified     Problem List Patient Active Problem List   Diagnosis Date Noted  . Lymphedema 01/17/2016  . Venous (peripheral) insufficiency 01/17/2016  . Pain in limb 01/17/2016  . Iron deficiency anemia due to chronic blood loss 01/09/2016  . History of endometrial cancer 08/01/2015  . Endometrial adenocarcinoma ([place]) 07/13/2015    Class: History of  .  Inguinal lymphadenopathy 07/13/2015  . Long term current use of insulin ([place]) 12/12/2013  . Microalbuminuria 12/12/2013  . Diabetes ([place]) 07/28/2013  . BP (high blood pressure) 07/28/2013  . HLD (hyperlipidemia) 07/28/2013  . Adiposity 07/28/2013  . Obstructive apnea 07/28/2013  . Arthritis, degenerative 07/28/2013  . Apnea, sleep 07/28/2013  . Thyroid nodule 07/28/2013   [name] PT, DPT [name] 07/01/2017, 5:22 PM  [place] [address], [place], [address] Phone: [phone]   Fax:  [phone]  Name: Christine Lawson MRN: [id number] Date of Birth: [birth year]

## 2017-07-07 ENCOUNTER — Other Ambulatory Visit: Payer: Self-pay

## 2017-07-07 ENCOUNTER — Encounter: Payer: Self-pay | Admitting: Occupational Therapy

## 2017-07-07 ENCOUNTER — Ambulatory Visit: Payer: Medicare Other | Admitting: Occupational Therapy

## 2017-07-07 DIAGNOSIS — M25632 Stiffness of left wrist, not elsewhere classified: Secondary | ICD-10-CM

## 2017-07-07 DIAGNOSIS — M25532 Pain in left wrist: Secondary | ICD-10-CM

## 2017-07-07 DIAGNOSIS — M25612 Stiffness of left shoulder, not elsewhere classified: Secondary | ICD-10-CM | POA: Diagnosis not present

## 2017-07-07 DIAGNOSIS — M6281 Muscle weakness (generalized): Secondary | ICD-10-CM

## 2017-07-07 DIAGNOSIS — M79642 Pain in left hand: Secondary | ICD-10-CM

## 2017-07-07 NOTE — Therapy (Signed)
[place] [address] [place], [address] Phone: [phone]   Fax:  [phone]  Occupational Therapy Evaluation  Patient Details  Name: Christine Lawson MRN: [id number] Date of Birth: 03-06-1960 Referring Provider: Arvella Nigh   Encounter Date: 07/07/2017  OT End of Session - 07/07/17 1816    Visit Number  1    Number of Visits  12    Date for OT Re-Evaluation  08/18/17    OT Start Time  1115    OT Stop Time  1213    OT Time Calculation (min)  58 min    Activity Tolerance  Treatment limited secondary to medical complications (Comment)    Behavior During Therapy  [facility] for tasks assessed/performed;Impulsive       Past Medical History:  Diagnosis Date  . Diabetes mellitus without complication ([place])   . Endometrial adenocarcinoma ([place])   . GERD (gastroesophageal reflux disease)   . Hematochezia   . Hyperlipidemia   . Hypertension   . IDA (iron deficiency anemia)   . Inguinal lymphadenopathy 07/13/2015  . Irritable bowel syndrome   . Obesity   . Osteoarthritis   . Sleep apnea     Past Surgical History:  Procedure Laterality Date  . ABDOMINAL HYSTERECTOMY  feb 2013  . COLONOSCOPY WITH PROPOFOL N/A 08/02/2015   Procedure: COLONOSCOPY WITH PROPOFOL;  Surgeon: Manya Silvas, MD;  Location: [facility] ENDOSCOPY;  Service: Endoscopy;  Laterality: N/A;  . ESOPHAGOGASTRODUODENOSCOPY (EGD) WITH PROPOFOL N/A 08/02/2015   Procedure: ESOPHAGOGASTRODUODENOSCOPY (EGD) WITH PROPOFOL;  Surgeon: Manya Silvas, MD;  Location: [facility] ENDOSCOPY;  Service: Endoscopy;  Laterality: N/A;    There were no vitals filed for this visit.  Subjective Assessment - 07/07/17 1251    Subjective   I fell end of Febr and hurt my shoulder and wrist - it is getting slowly better - I wear my splint more than 50% of time and sleeping with it     Patient Stated Goals  I want to be able to use my hand like before - dressing , bathing , cutting  food, lift and carry objects     Currently in Pain?  Yes    Pain Score  7     Pain Location  Wrist    Pain Orientation  Left    Pain Descriptors / Indicators  Aching    Pain Type  Acute pain    Pain Onset  More than a month ago    Pain Frequency  Constant        [facility] OT Assessment - 07/07/17 0001      Assessment   Medical Diagnosis  L wrist sprain    Referring Provider  gaines    Onset Date/Surgical Date  04/02/17    Hand Dominance  Right    Prior Therapy  Yes; successful      Precautions   Required Braces or Orthoses  -- L wrist brace      Balance Screen   Has the patient fallen in the past 6 months  Yes      Home  Environment   Lives With  -- sister      Prior Function   Level of Independence  Independent    Vocation  On disability    Leisure  house work , play on IPAD      AROM   Left Forearm Supination  65 Degrees    Right Wrist Extension  55 Degrees  Right Wrist Flexion  90 Degrees    Left Wrist Extension  45 Degrees    Left Wrist Flexion  70 Degrees    Left Wrist Radial Deviation  24 Degrees    Left Wrist Ulnar Deviation  32 Degrees      Strength   Right Hand Grip (lbs)  35    Right Hand Lateral Pinch  12 lbs    Right Hand 3 Point Pinch  12 lbs    Left Hand Grip (lbs)  18    Left Hand Lateral Pinch  7 lbs    Left Hand 3 Point Pinch  7 lbs      Right Hand AROM   R Index  MCP 0-90  80 Degrees    R Index PIP 0-100  90 Degrees    R Long  MCP 0-90  80 Degrees    R Long PIP 0-100  90 Degrees    R Ring  MCP 0-90  80 Degrees    R Ring PIP 0-100  100 Degrees    R Little  MCP 0-90  90 Degrees    R Little PIP 0-100  100 Degrees      Left Hand AROM   L Index  MCP 0-90  75 Degrees    L Index PIP 0-100  90 Degrees    L Long  MCP 0-90  75 Degrees    L Long PIP 0-100  85 Degrees    L Ring  MCP 0-90  75 Degrees    L Ring PIP 0-100  90 Degrees    L Little  MCP 0-90  75 Degrees    L Little PIP 0-100  90 Degrees       fluidotherapy done prior to review  of HEP -  Fitted with isotoner glove  And to cont with splint    REview with pt and sister her HEP   Contrast   tendon glides  Wrist AROM RD, UD , ext, flexion pain free range  Sup/pro pain free range  10 reps  2-3 x day  isotoner glove night time and splint  And as needed during day                OT Education - 07/07/17 1816    Education provided  Yes    Education Details  findings of eval and HEP     Person(s) Educated  Patient    Methods  Explanation;Demonstration;Tactile cues    Comprehension  Verbalized understanding;Returned demonstration       OT Short Term Goals - 07/07/17 1824      OT SHORT TERM GOAL #1   Title  Pain on L wrist decrease to less than 5/10 at rest to wean out of splint     Baseline  splint more than 50% during day ,  night time - and pain between 7-10/10 pe rpt     Time  3    Period  Weeks    Status  New    Target Date  07/28/17      OT SHORT TERM GOAL #2   Title  Pt to be ind in HEP to decrease pain , increase AROM in wrist in all planes and increase grip     Baseline  no knowledge    Time  2    Period  Weeks    Status  New    Target Date  07/21/17        OT Long Term  Goals - 07/07/17 1826      OT LONG TERM GOAL #1   Title  Pt wrist AROM and strength increase for pt to use L hand in more than 50% of bathing , cut food, turn doorknob, without increase symptoms     Baseline  see flowsheet     Time  5    Period  Weeks    Status  New    Target Date  08/11/17      OT LONG TERM GOAL #2   Title  L grip  increase with more than 5 lbs and prehension strength increase with more than 3 lbs to use hand in cooking , laudry and cut food     Baseline  L grip 18 , R 35, Lat/ 3 point grip L 7 lbs and R 12     Time  6    Period  Weeks    Status  New    Target Date  08/18/17            Plan - 07/07/17 1817    Clinical Impression Statement  Pt present about 9 wks from fall in kitchen resulting in shoulder injury and sprain wrist  - pt was since then in wrist prefab splint -Pt show increase edema of more than 2 cm on dorsal wrist compare to R -  pt reprot that pain is better and she is still sleeping with splint and wear it more than 50% of time during day- but pain this date at rest 7/10 per pt - pt show decrease AROM in wrist mostly wrist ext and  flexion , sup impaired - but pain increase with wrist in all planes and fisting - pt denies any sensory issues - pt limited in use of L hand in ADL's and IADL,s     Occupational performance deficits (Please refer to evaluation for details):  ADL's;IADL's;Play;Leisure    Current Impairments/barriers affecting progress:  co-morbitities     OT Frequency  2x / week    OT Duration  6 weeks    OT Treatment/Interventions  Self-care/ADL training;Iontophoresis;Therapeutic exercise;Ultrasound;Contrast Bath;Fluidtherapy;Manual Therapy;Splinting;Patient/family education    Plan  Assess progress with HEP-  contrast , or ionto /US     Clinical Decision Making  Multiple treatment options, significant modification of task necessary    OT Home Exercise Plan  See pt instruction    Consulted and Agree with Plan of Care  Patient       Patient will benefit from skilled therapeutic intervention in order to improve the following deficits and impairments:  Pain, Impaired flexibility, Increased edema, Decreased strength, Decreased range of motion, Impaired UE functional use  Visit Diagnosis: Pain in left wrist - Plan: Ot plan of care cert/re-cert  Stiffness of left wrist, not elsewhere classified - Plan: Ot plan of care cert/re-cert  Muscle weakness (generalized) - Plan: Ot plan of care cert/re-cert  Pain in left hand - Plan: Ot plan of care cert/re-cert    Problem List Patient Active Problem List   Diagnosis Date Noted  . Lymphedema 01/17/2016  . Venous (peripheral) insufficiency 01/17/2016  . Pain in limb 01/17/2016  . Iron deficiency anemia due to chronic blood loss 01/09/2016  .  History of endometrial cancer 08/01/2015  . Endometrial adenocarcinoma ([place]) 07/13/2015    Class: History of  . Inguinal lymphadenopathy 07/13/2015  . Long term current use of insulin ([place]) 12/12/2013  . Microalbuminuria 12/12/2013  . Diabetes ([place]) 07/28/2013  . BP (high blood pressure) 07/28/2013  .  HLD (hyperlipidemia) 07/28/2013  . Adiposity 07/28/2013  . Obstructive apnea 07/28/2013  . Arthritis, degenerative 07/28/2013  . Apnea, sleep 07/28/2013  . Thyroid nodule 07/28/2013    Rosalyn Gess OTR/l,CLT 07/07/2017, 6:32 PM  [facility] [address], [place], [address] Phone: [phone]   Fax:  [phone]  Name: Christine Lawson MRN: [id number] Date of Birth: 30-Apr-1960

## 2017-07-07 NOTE — Patient Instructions (Signed)
Contrast   tendon glides  Wrist AROM RD, UD , ext, flexion pain free range  Sup/pro pain free range  10 reps  2-3 x day  isotoner glove night time and splint  And as needed during day

## 2017-07-08 ENCOUNTER — Ambulatory Visit: Payer: Medicare Other | Admitting: Physical Therapy

## 2017-07-08 ENCOUNTER — Encounter: Payer: Self-pay | Admitting: Physical Therapy

## 2017-07-08 DIAGNOSIS — M25612 Stiffness of left shoulder, not elsewhere classified: Secondary | ICD-10-CM | POA: Diagnosis not present

## 2017-07-08 NOTE — Therapy (Signed)
[facility] [address] [place], [address] Phone: [phone]   Fax:  [phone]  Physical Therapy Treatment  Patient Details  Name: Christine Lawson MRN: [id number] Date of Birth: 21-Jul-1960 Referring Provider: Arvella Nigh   Encounter Date: 07/08/2017  PT End of Session - 07/08/17 1353    Visit Number  10    Number of Visits  17    Date for PT Re-Evaluation  07/29/17    PT Start Time  0146    PT Stop Time  0230    PT Time Calculation (min)  44 min    Activity Tolerance  Patient tolerated treatment well;Patient limited by pain    Behavior During Therapy  [facility] for tasks assessed/performed;Impulsive       Past Medical History:  Diagnosis Date  . Diabetes mellitus without complication ([place])   . Endometrial adenocarcinoma ([place])   . GERD (gastroesophageal reflux disease)   . Hematochezia   . Hyperlipidemia   . Hypertension   . IDA (iron deficiency anemia)   . Inguinal lymphadenopathy 07/13/2015  . Irritable bowel syndrome   . Obesity   . Osteoarthritis   . Sleep apnea     Past Surgical History:  Procedure Laterality Date  . ABDOMINAL HYSTERECTOMY  feb 2013  . COLONOSCOPY WITH PROPOFOL N/A 08/02/2015   Procedure: COLONOSCOPY WITH PROPOFOL;  Surgeon: Manya Silvas, MD;  Location[facility] ENDOSCOPY;  Service: Endoscopy;  Laterality: N/A;  . ESOPHAGOGASTRODUODENOSCOPY (EGD) WITH PROPOFOL N/A 08/02/2015   Procedure: ESOPHAGOGASTRODUODENOSCOPY (EGD) WITH PROPOFOL;  Surgeon: Manya Silvas, MD;  Location[facility] ENDOSCOPY;  Service: Endoscopy;  Laterality: N/A;    There were no vitals filed for this visit.  Subjective Assessment - 07/08/17 1350    Subjective  Patient reports her pain is only a 6/10 and she feels as though her motion is improving and that she was able able to raise her hand in church this Sunday, which she was excited about.     Pertinent History  Patient is 57 year old female that reports a  fall resulting in a chair falling onto L shoulder and wrist at the beginning of March. Patient reports that MD instructed her to wear shoulder sling as needed in community and follow up appointment Dr. Theresa Mulligan (3/27) instructed patient to wear L wrist brace for wrist sprain. Patient is scheduled for MRI 4/8 for shoulder and wrist. Patient reports worst pain in L shoulder 10/10 in past week, worst pain in L wrist 10/10. Patient reports the best her pain has been in the R shoulder in the past week: 8/10 wrist:  9/10    Limitations  House hold activities;Lifting;Writing;Sitting;Standing;Walking    How long can you sit comfortably?  Pain in all positions    How long can you stand comfortably?  Pain in all positions    How long can you walk comfortably?  Pain in all positions    Diagnostic tests  MRI scheduled 4/8    Patient Stated Goals  decrease pain    Pain Onset  More than a month ago    Pain Onset  1 to 4 weeks ago       Ther-Ex -Pulleys AAROM flex 15x abd 15x -Standing shoulder ER 3x 12 with cuing for eccentric control -Standing shoulder ER 3x 12 with cuing for eccentric control -Standing neutral rows 3x 12 10# with TC for posture -Standing high rows 3x 10 10# with VC and TC needed initially for proper  form  Manual -Bouts of PROM into flex/abd/IR/ER holding 3 sec at end range, increasing end range each bout. Final measurements listed below Flex: 146d Abd: 133d ER: 81d  IR: 84d   ESTIM + heat pack [employer] ESTIM 15 min at patient tolerated 80V increased to 90V through treatment at L latissimus area . Attempted d/t success of treatment at previous session success. With PT assessing patient tolerance throughout (increasing intensity as needed), monitoring skin integrity (normal), with decreased pain noted from patient. Utilized this time for PROM of L shoulder                          PT Education - 07/08/17 1353    Education provided  Yes    Education Details  Exercise  form    Person(s) Educated  Patient    Methods  Explanation;Demonstration;Verbal cues;Tactile cues    Comprehension  Verbal cues required;Returned demonstration;Verbalized understanding       PT Short Term Goals - 06/04/17 4166      PT SHORT TERM GOAL #1   Title  Pt will be independent with HEP in order to improve strength and balance in order to decrease fall risk and improve function at home and work.    Time  2    Period  Weeks    Status  New    Target Date  06/18/17        PT Long Term Goals - 06/04/17 1230      PT LONG TERM GOAL #1   Title  Patient will increase FOTO score to 56 to demonstrate predicted increase in functional mobility to complete ADLs    Baseline  4/4 36    Time  8    Period  Weeks    Status  New      PT LONG TERM GOAL #2   Title  Pt will decrease worst pain as reported on NPRS by at least 3 points in order to demonstrate clinically significant reduction in pain.    Baseline  4/4: 10/10 in L shoulder and wrist    Time  8    Period  Weeks    Status  New      PT LONG TERM GOAL #3   Title  Pt will be able to open a jar to each side successfully without pain in order to complete ADLs    Baseline  4/4 unable    Time  8    Period  Weeks    Status  New      PT LONG TERM GOAL #4   Title  Patient will demonstrate L grip strength of 15kg to return to PLOF in order to complete ADLs    Baseline  4/4 8kg    Time  8    Period  Weeks    Status  New            Plan - 07/08/17 1436    Clinical Impression Statement  Patient is continuing to demonstrate increased AROM and is having less pain and requiring less cuing from PT for proper form with therex. Patient's passive IR and flexion has improved from last visit, but ER and abd have remained about the same. Pt admits that she has  not been doing her AAROM at home reporting that she "lost her paper. PT re-printed cane assisted motions and continued to educate patient on HEP for maintaining PT gains at home     Clinical Impairments  Affecting Rehab Potential  (-) transportation, multiple comorbidities (DM II, obesity, GERD, communication barrier, sedentary lifestyle (+) positive attitude,     PT Frequency  2x / week    PT Duration  8 weeks    PT Treatment/Interventions  Neuromuscular re-education;Passive range of motion;Manual techniques;Patient/family education;Taping;Therapeutic exercise;Therapeutic activities;Functional mobility training;Moist Heat;Ultrasound;Cryotherapy;Electrical Stimulation    PT Next Visit Plan  Continue AAROM/PROM, assess AROM, begin isometrics as tolerate.     PT Home Exercise Plan  Cane assisted ER/abd/flex, scap retractions, wrist AAROM with other hand into flex/ext, desensitization to L shoulder (brushing)    Consulted and Agree with Plan of Care  Patient       Patient will benefit from skilled therapeutic intervention in order to improve the following deficits and impairments:  Increased fascial restricitons, Pain, Improper body mechanics, Increased muscle spasms, Impaired tone, Postural dysfunction, Decreased activity tolerance, Decreased endurance, Decreased range of motion, Decreased strength, Impaired flexibility  Visit Diagnosis: Stiffness of left shoulder, not elsewhere classified     Problem List Patient Active Problem List   Diagnosis Date Noted  . Lymphedema 01/17/2016  . Venous (peripheral) insufficiency 01/17/2016  . Pain in limb 01/17/2016  . Iron deficiency anemia due to chronic blood loss 01/09/2016  . History of endometrial cancer 08/01/2015  . Endometrial adenocarcinoma ([place]) 07/13/2015    Class: History of  . Inguinal lymphadenopathy 07/13/2015  . Long term current use of insulin ([place]) 12/12/2013  . Microalbuminuria 12/12/2013  . Diabetes ([place]) 07/28/2013  . BP (high blood pressure) 07/28/2013  . HLD (hyperlipidemia) 07/28/2013  . Adiposity 07/28/2013  . Obstructive apnea 07/28/2013  . Arthritis, degenerative 07/28/2013  . Apnea, sleep  07/28/2013  . Thyroid nodule 07/28/2013   [name] PT, DPT [name] 07/08/2017, 2:41 PM  [place] [place] [facility] [address] [place], [address] Phone: [phone]   Fax:  [phone]  Name: Christine Lawson MRN: [id number] Date of Birth: 10/12/1960

## 2017-07-10 ENCOUNTER — Encounter: Payer: Self-pay | Admitting: Physical Therapy

## 2017-07-10 ENCOUNTER — Ambulatory Visit: Payer: Medicare Other | Admitting: Physical Therapy

## 2017-07-10 DIAGNOSIS — G8929 Other chronic pain: Secondary | ICD-10-CM

## 2017-07-10 DIAGNOSIS — M25612 Stiffness of left shoulder, not elsewhere classified: Secondary | ICD-10-CM | POA: Diagnosis not present

## 2017-07-10 DIAGNOSIS — M25512 Pain in left shoulder: Secondary | ICD-10-CM

## 2017-07-10 NOTE — Therapy (Signed)
[place] [facility] [address], [place], [address] Phone: [phone]   Fax:  [phone]  Physical Therapy Treatment  Patient Details  Name: Christine Lawson MRN: [id number] Date of Birth: Jul 14, 1960 Referring Provider: Arvella Nigh   Encounter Date: 07/10/2017  PT End of Session - 07/10/17 1049    Visit Number  11    Number of Visits  17    Date for PT Re-Evaluation  07/29/17    PT Start Time  1000    PT Stop Time  1045    PT Time Calculation (min)  45 min    Activity Tolerance  Patient tolerated treatment well;Patient limited by pain    Behavior During Therapy  [facility] for tasks assessed/performed;Impulsive       Past Medical History:  Diagnosis Date  . Diabetes mellitus without complication ([place])   . Endometrial adenocarcinoma ([place])   . GERD (gastroesophageal reflux disease)   . Hematochezia   . Hyperlipidemia   . Hypertension   . IDA (iron deficiency anemia)   . Inguinal lymphadenopathy 07/13/2015  . Irritable bowel syndrome   . Obesity   . Osteoarthritis   . Sleep apnea     Past Surgical History:  Procedure Laterality Date  . ABDOMINAL HYSTERECTOMY  feb 2013  . COLONOSCOPY WITH PROPOFOL N/A 08/02/2015   Procedure: COLONOSCOPY WITH PROPOFOL;  Surgeon: Manya Silvas, MD;  Location: [facility] ENDOSCOPY;  Service: Endoscopy;  Laterality: N/A;  . ESOPHAGOGASTRODUODENOSCOPY (EGD) WITH PROPOFOL N/A 08/02/2015   Procedure: ESOPHAGOGASTRODUODENOSCOPY (EGD) WITH PROPOFOL;  Surgeon: Manya Silvas, MD;  Location: [facility] ENDOSCOPY;  Service: Endoscopy;  Laterality: N/A;    There were no vitals filed for this visit.  Subjective Assessment - 07/10/17 0920    Subjective  Patient reports decreased only 5/10 pain today, and reports her shoulder is moving "much better". Patient reports compliance with her HEP     Pertinent History  Patient is 57 year old female that reports a fall resulting in a chair falling onto L shoulder  and wrist at the beginning of March. Patient reports that MD instructed her to wear shoulder sling as needed in community and follow up appointment Dr. Theresa Mulligan (3/27) instructed patient to wear L wrist brace for wrist sprain. Patient is scheduled for MRI 4/8 for shoulder and wrist. Patient reports worst pain in L shoulder 10/10 in past week, worst pain in L wrist 10/10. Patient reports the best her pain has been in the R shoulder in the past week: 8/10 wrist:  9/10    Limitations  House hold activities;Lifting;Writing;Sitting;Standing;Walking    How long can you sit comfortably?  Pain in all positions    How long can you stand comfortably?  Pain in all positions    How long can you walk comfortably?  Pain in all positions    Diagnostic tests  MRI scheduled 4/8    Patient Stated Goals  decrease pain    Pain Onset  More than a month ago    Pain Onset  1 to 4 weeks ago         Ther-Ex -Pulleys 15x  Flexion 15x abd for wamup -Standing abd 3x 12 with 2# cuff wt on wrist w/ eccentric -Standing flex 3x 12 with 2# cuff wt on wrist with cuing to prevent shoulder hiking -L overhead farmers carry with 2# ankle wt cuff 2X 60ft -Sidelying ER with 3# cuff wt 3x 12 with TC for proper form with 2sec hold  at max ER each rep   Manual -Mulitple bouts of PROM duing ESTIM holding 3-5sec at end range, increasing end range each bout Measured below: Flex: 146d Abd: 141d ER: 88d IR: 89d        ESTIM + heat pack [employer] ESTIM 12 min at patient tolerated 90V increased to 100V through treatment at L latissimus . Attempted d/t success of treatment at previous session success. With PT assessing patient tolerance throughout (increasing intensity as needed), monitoring skin integrity (normal), with decreased pain noted from patient. PROM completed at this time                   PT Education - 07/10/17 1045    Education provided  Yes    Education Details  Exercise form    Person(s) Educated  Patient     Methods  Explanation;Demonstration;Verbal cues    Comprehension  Verbal cues required;Returned demonstration;Verbalized understanding       PT Short Term Goals - 06/04/17 0938      PT SHORT TERM GOAL #1   Title  Pt will be independent with HEP in order to improve strength and balance in order to decrease fall risk and improve function at home and work.    Time  2    Period  Weeks    Status  New    Target Date  06/18/17        PT Long Term Goals - 06/04/17 1230      PT LONG TERM GOAL #1   Title  Patient will increase FOTO score to 56 to demonstrate predicted increase in functional mobility to complete ADLs    Baseline  4/4 36    Time  8    Period  Weeks    Status  New      PT LONG TERM GOAL #2   Title  Pt will decrease worst pain as reported on NPRS by at least 3 points in order to demonstrate clinically significant reduction in pain.    Baseline  4/4: 10/10 in L shoulder and wrist    Time  8    Period  Weeks    Status  New      PT LONG TERM GOAL #3   Title  Pt will be able to open a jar to each side successfully without pain in order to complete ADLs    Baseline  4/4 unable    Time  8    Period  Weeks    Status  New      PT LONG TERM GOAL #4   Title  Patient will demonstrate L grip strength of 15kg to return to PLOF in order to complete ADLs    Baseline  4/4 8kg    Time  8    Period  Weeks    Status  New            Plan - 07/10/17 1050    Clinical Impression Statement  Patient is continuing to increase ROM and is reporting decreased pain. PT led patient through progressive resistance training, which patient was able to tolerate without increased pain, only muscle fatigue. Patient is demonstrating less tenderness/trigger points in L RTC musculature, and latissimus, but latissimus is still very tender to touch, but is continuing to respond well to ESTIM treatment. With ESTIM treatment, patient is continuing to increase PROM, especially in flexion, which PT  believes is d/t decreased latissimus tension.     Rehab Potential  Fair  Clinical Impairments Affecting Rehab Potential  (-) transportation, multiple comorbidities (DM II, obesity, GERD, communication barrier, sedentary lifestyle (+) positive attitude,     PT Frequency  2x / week    PT Duration  8 weeks    PT Treatment/Interventions  Neuromuscular re-education;Passive range of motion;Manual techniques;Patient/family education;Taping;Therapeutic exercise;Therapeutic activities;Functional mobility training;Moist Heat;Ultrasound;Cryotherapy;Electrical Stimulation    PT Next Visit Plan  Continue AAROM/PROM, assess AROM, begin isometrics as tolerate.     PT Home Exercise Plan  Cane assisted ER/abd/flex, scap retractions, wrist AAROM with other hand into flex/ext, desensitization to L shoulder (brushing)    Consulted and Agree with Plan of Care  Patient       Patient will benefit from skilled therapeutic intervention in order to improve the following deficits and impairments:  Increased fascial restricitons, Pain, Improper body mechanics, Increased muscle spasms, Impaired tone, Postural dysfunction, Decreased activity tolerance, Decreased endurance, Decreased range of motion, Decreased strength, Impaired flexibility  Visit Diagnosis: Stiffness of left shoulder, not elsewhere classified  Chronic left shoulder pain     Problem List Patient Active Problem List   Diagnosis Date Noted  . Lymphedema 01/17/2016  . Venous (peripheral) insufficiency 01/17/2016  . Pain in limb 01/17/2016  . Iron deficiency anemia due to chronic blood loss 01/09/2016  . History of endometrial cancer 08/01/2015  . Endometrial adenocarcinoma ([place]) 07/13/2015    Class: History of  . Inguinal lymphadenopathy 07/13/2015  . Long term current use of insulin ([place]) 12/12/2013  . Microalbuminuria 12/12/2013  . Diabetes ([place]) 07/28/2013  . BP (high blood pressure) 07/28/2013  . HLD (hyperlipidemia) 07/28/2013  .  Adiposity 07/28/2013  . Obstructive apnea 07/28/2013  . Arthritis, degenerative 07/28/2013  . Apnea, sleep 07/28/2013  . Thyroid nodule 07/28/2013   [name] PT, DPT [name] 07/10/2017, 11:10 AM  [place] [facility] [address] [place], [address] Phone: [phone]   Fax:  [phone]  Name: Christine Lawson MRN: [id number] Date of Birth: [birth year]

## 2017-07-14 ENCOUNTER — Ambulatory Visit: Payer: Medicare Other | Admitting: Physical Therapy

## 2017-07-14 ENCOUNTER — Ambulatory Visit: Payer: Medicare Other | Admitting: Occupational Therapy

## 2017-07-16 ENCOUNTER — Ambulatory Visit: Payer: Medicare Other | Admitting: Physical Therapy

## 2017-07-16 ENCOUNTER — Encounter: Payer: Self-pay | Admitting: Physical Therapy

## 2017-07-16 DIAGNOSIS — M25512 Pain in left shoulder: Principal | ICD-10-CM

## 2017-07-16 DIAGNOSIS — M25612 Stiffness of left shoulder, not elsewhere classified: Secondary | ICD-10-CM | POA: Diagnosis not present

## 2017-07-16 DIAGNOSIS — G8929 Other chronic pain: Secondary | ICD-10-CM

## 2017-07-16 NOTE — Therapy (Signed)
[place] [facility] [address], [place], [address] Phone: [phone]   Fax:  [phone]  Physical Therapy Treatment  Patient Details  Name: Christine Lawson MRN: [id number] Date of Birth: 1960-12-28 Referring Provider: Arvella Nigh   Encounter Date: 07/16/2017  PT End of Session - 07/16/17 1656    Visit Number  12    Number of Visits  17    Date for PT Re-Evaluation  07/29/17    PT Start Time  0445    PT Stop Time  0530    PT Time Calculation (min)  45 min    Activity Tolerance  Patient tolerated treatment well;Patient limited by pain    Behavior During Therapy  [facility] for tasks assessed/performed;Impulsive       Past Medical History:  Diagnosis Date  . Diabetes mellitus without complication ([place])   . Endometrial adenocarcinoma ([place])   . GERD (gastroesophageal reflux disease)   . Hematochezia   . Hyperlipidemia   . Hypertension   . IDA (iron deficiency anemia)   . Inguinal lymphadenopathy 07/13/2015  . Irritable bowel syndrome   . Obesity   . Osteoarthritis   . Sleep apnea     Past Surgical History:  Procedure Laterality Date  . ABDOMINAL HYSTERECTOMY  feb 2013  . COLONOSCOPY WITH PROPOFOL N/A 08/02/2015   Procedure: COLONOSCOPY WITH PROPOFOL;  Surgeon: Manya Silvas, MD;  Location: [facility] ENDOSCOPY;  Service: Endoscopy;  Laterality: N/A;  . ESOPHAGOGASTRODUODENOSCOPY (EGD) WITH PROPOFOL N/A 08/02/2015   Procedure: ESOPHAGOGASTRODUODENOSCOPY (EGD) WITH PROPOFOL;  Surgeon: Manya Silvas, MD;  Location: [facility] ENDOSCOPY;  Service: Endoscopy;  Laterality: N/A;    There were no vitals filed for this visit.  Subjective Assessment - 07/16/17 1653    Subjective  Patient reports increased pain to 7/10 after "hitting her shoulder on her dresser earlier this week". Patient reports compliance with "some of her HEP"    Pertinent History  Patient is 57 year old female that reports a fall resulting in a chair falling  onto L shoulder and wrist at the beginning of March. Patient reports that MD instructed her to wear shoulder sling as needed in community and follow up appointment Dr. Theresa Mulligan (3/27) instructed patient to wear L wrist brace for wrist sprain. Patient is scheduled for MRI 4/8 for shoulder and wrist. Patient reports worst pain in L shoulder 10/10 in past week, worst pain in L wrist 10/10. Patient reports the best her pain has been in the R shoulder in the past week: 8/10 wrist:  9/10    Limitations  House hold activities;Lifting;Writing;Sitting;Standing;Walking    How long can you sit comfortably?  Pain in all positions    How long can you stand comfortably?  Pain in all positions    How long can you walk comfortably?  Pain in all positions    Patient Stated Goals  decrease pain    Pain Onset  More than a month ago          Ther-Ex -Pulleys flex 15x abd 15x warm up with near full ROM -High rows 2x 10 1x 9 15# with TC to prevent "rocking backward" to ensure proper scapular movement  -Standing neutral rows 1x 12 15# 2x 20# with min TC for proper form -Tricep ext 10# 3x 12 with TC to maintain proper elbow placement to elicit proper tricep contraction -Seated shoulder abd 3x 10    Manual -STM w/ trigger point release to L latissimus/teres minor with  patient very guarded with withdraw response, requiring prolonged time to relax patient to obtain therapeutic effect -STM w/ trigger point release to L bicep -Multiple bouts of PROM into abd/flex/IR/ER with 2-3 sec holds at end range, increasing range as able                 PT Education - 07/16/17 1656    Education provided  Yes    Education Details  Exercise form    Person(s) Educated  Patient    Methods  Explanation;Demonstration;Tactile cues;Verbal cues    Comprehension  Verbal cues required;Tactile cues required;Returned demonstration;Verbalized understanding       PT Short Term Goals - 06/04/17 7782      PT SHORT TERM GOAL  #1   Title  Pt will be independent with HEP in order to improve strength and balance in order to decrease fall risk and improve function at home and work.    Time  2    Period  Weeks    Status  New    Target Date  06/18/17        PT Long Term Goals - 06/04/17 1230      PT LONG TERM GOAL #1   Title  Patient will increase FOTO score to 56 to demonstrate predicted increase in functional mobility to complete ADLs    Baseline  4/4 36    Time  8    Period  Weeks    Status  New      PT LONG TERM GOAL #2   Title  Pt will decrease worst pain as reported on NPRS by at least 3 points in order to demonstrate clinically significant reduction in pain.    Baseline  4/4: 10/10 in L shoulder and wrist    Time  8    Period  Weeks    Status  New      PT LONG TERM GOAL #3   Title  Pt will be able to open a jar to each side successfully without pain in order to complete ADLs    Baseline  4/4 unable    Time  8    Period  Weeks    Status  New      PT LONG TERM GOAL #4   Title  Patient will demonstrate L grip strength of 15kg to return to PLOF in order to complete ADLs    Baseline  4/4 8kg    Time  8    Period  Weeks    Status  New            Plan - 07/16/17 1723    Clinical Impression Statement  Pt with increased pain this session, so therex aimed at periscauplar/ tricepstrengthening to not further irritate cheif anterior shoulder pain compliant, and to utilize reciprocol inhibition for tightness in anterior shoulder musculature. Patient able to complete all therex with no increased pain, only muscle fatigue noted, requiring PT cuing for proper form without compensation. Pt very guarded with manual techniques to L latissimus/teres minor, requiring prolonged time for patient to relax to a point that manual techniques could produce a therapeutic effect. Patient reports less pain following session.     Rehab Potential  Fair    Clinical Impairments Affecting Rehab Potential  (-)  transportation, multiple comorbidities (DM II, obesity, GERD, communication barrier, sedentary lifestyle (+) positive attitude,     PT Frequency  2x / week    PT Treatment/Interventions  Neuromuscular re-education;Passive range of motion;Manual techniques;Patient/family education;Taping;Therapeutic  exercise;Therapeutic activities;Functional mobility training;Moist Heat;Ultrasound;Cryotherapy;Electrical Stimulation    PT Next Visit Plan  Continue AAROM/PROM, assess AROM, begin isometrics as tolerate.     PT Home Exercise Plan  Cane assisted ER/abd/flex, scap retractions, wrist AAROM with other hand into flex/ext, desensitization to L shoulder (brushing)    Consulted and Agree with Plan of Care  Patient       Patient will benefit from skilled therapeutic intervention in order to improve the following deficits and impairments:  Increased fascial restricitons, Pain, Improper body mechanics, Increased muscle spasms, Impaired tone, Postural dysfunction, Decreased activity tolerance, Decreased endurance, Decreased range of motion, Decreased strength, Impaired flexibility  Visit Diagnosis: Chronic left shoulder pain     Problem List Patient Active Problem List   Diagnosis Date Noted  . Lymphedema 01/17/2016  . Venous (peripheral) insufficiency 01/17/2016  . Pain in limb 01/17/2016  . Iron deficiency anemia due to chronic blood loss 01/09/2016  . History of endometrial cancer 08/01/2015  . Endometrial adenocarcinoma ([place]) 07/13/2015    Class: History of  . Inguinal lymphadenopathy 07/13/2015  . Long term current use of insulin ([place]) 12/12/2013  . Microalbuminuria 12/12/2013  . Diabetes ([place]) 07/28/2013  . BP (high blood pressure) 07/28/2013  . HLD (hyperlipidemia) 07/28/2013  . Adiposity 07/28/2013  . Obstructive apnea 07/28/2013  . Arthritis, degenerative 07/28/2013  . Apnea, sleep 07/28/2013  . Thyroid nodule 07/28/2013    [name] PT, DPT [name] 07/16/2017, 5:39  PM  [place] [facility] [address], [place], [address] Phone: [phone]   Fax:  [phone]  Name: Christine Lawson MRN: [id number] Date of Birth: May 23, 1960

## 2017-07-17 ENCOUNTER — Ambulatory Visit: Payer: Medicare Other | Admitting: Occupational Therapy

## 2017-07-20 ENCOUNTER — Ambulatory Visit: Payer: Medicare Other | Admitting: Physical Therapy

## 2017-07-20 ENCOUNTER — Encounter: Payer: Self-pay | Admitting: Physical Therapy

## 2017-07-20 DIAGNOSIS — M25512 Pain in left shoulder: Principal | ICD-10-CM

## 2017-07-20 DIAGNOSIS — M25612 Stiffness of left shoulder, not elsewhere classified: Secondary | ICD-10-CM | POA: Diagnosis not present

## 2017-07-20 DIAGNOSIS — G8929 Other chronic pain: Secondary | ICD-10-CM

## 2017-07-20 NOTE — Therapy (Signed)
[place] [facility] [address] [place], [address] Phone: [phone]   Fax:  [phone]  Physical Therapy Treatment  Patient Details  Name: Christine Lawson MRN: [id number] Date of Birth: 1960-08-15 Referring Provider: Arvella Nigh   Encounter Date: 07/20/2017  PT End of Session - 07/20/17 1450    Visit Number  13    Number of Visits  17    Date for PT Re-Evaluation  07/29/17    PT Start Time  0230    PT Stop Time  0315    PT Time Calculation (min)  45 min    Activity Tolerance  Patient tolerated treatment well;Patient limited by pain    Behavior During Therapy  [facility] for tasks assessed/performed;Impulsive       Past Medical History:  Diagnosis Date  . Diabetes mellitus without complication ([place])   . Endometrial adenocarcinoma ([place])   . GERD (gastroesophageal reflux disease)   . Hematochezia   . Hyperlipidemia   . Hypertension   . IDA (iron deficiency anemia)   . Inguinal lymphadenopathy 07/13/2015  . Irritable bowel syndrome   . Obesity   . Osteoarthritis   . Sleep apnea     Past Surgical History:  Procedure Laterality Date  . ABDOMINAL HYSTERECTOMY  feb 2013  . COLONOSCOPY WITH PROPOFOL N/A 08/02/2015   Procedure: COLONOSCOPY WITH PROPOFOL;  Surgeon: Manya Silvas, MD;  Location: [facility] ENDOSCOPY;  Service: Endoscopy;  Laterality: N/A;  . ESOPHAGOGASTRODUODENOSCOPY (EGD) WITH PROPOFOL N/A 08/02/2015   Procedure: ESOPHAGOGASTRODUODENOSCOPY (EGD) WITH PROPOFOL;  Surgeon: Manya Silvas, MD;  Location: [facility] ENDOSCOPY;  Service: Endoscopy;  Laterality: N/A;    There were no vitals filed for this visit.  Subjective Assessment - 07/20/17 1435    Subjective  Patient reports 6/10 pain today in the L shoulder. Patient reports she has been completing her HEP without pain complete. No questions or concerns at this time    Pertinent History  Patient is 57 year old female that reports a fall resulting in a chair  falling onto L shoulder and wrist at the beginning of March. Patient reports that MD instructed her to wear shoulder sling as needed in community and follow up appointment Dr. Theresa Mulligan (3/27) instructed patient to wear L wrist brace for wrist sprain. Patient is scheduled for MRI 4/8 for shoulder and wrist. Patient reports worst pain in L shoulder 10/10 in past week, worst pain in L wrist 10/10. Patient reports the best her pain has been in the R shoulder in the past week: 8/10 wrist:  9/10    Limitations  House hold activities;Lifting;Writing;Sitting;Standing;Walking    How long can you sit comfortably?  Pain in all positions    How long can you stand comfortably?  Pain in all positions    How long can you walk comfortably?  Pain in all positions    Diagnostic tests  MRI scheduled 4/8    Patient Stated Goals  decrease pain    Pain Onset  More than a month ago    Pain Onset  1 to 4 weeks ago         Ther-Ex -Pulleys 15x flex 15x abd  -Empty can shoulder horizontal abduction 3x 10 with cuing for eccentric control with TC/VC and demo -Tband ER red 1x 12 blue 2x 10 with cuing for eccentric control -Tband IR red 1x 12 blue 2x 10 with cuing for proper form without rotation   Manual -Multiple bouts of PROM into  IR/ER/abd/flex with 2-3 sec holds in each position, increasing ROM each bout -STM and trigger point release to L latissimus/teres minor and bicep  -Manual bicep stretching increasing elbow ext until patient is able to                     PT Education - 07/20/17 1449    Education provided  Yes    Education Details  Exercise form    Person(s) Educated  Patient    Methods  Explanation;Verbal cues;Tactile cues    Comprehension  Verbal cues required;Verbalized understanding;Returned demonstration;Tactile cues required       PT Short Term Goals - 06/04/17 0938      PT SHORT TERM GOAL #1   Title  Pt will be independent with HEP in order to improve strength and balance in  order to decrease fall risk and improve function at home and work.    Time  2    Period  Weeks    Status  New    Target Date  06/18/17        PT Long Term Goals - 06/04/17 1230      PT LONG TERM GOAL #1   Title  Patient will increase FOTO score to 56 to demonstrate predicted increase in functional mobility to complete ADLs    Baseline  4/4 36    Time  8    Period  Weeks    Status  New      PT LONG TERM GOAL #2   Title  Pt will decrease worst pain as reported on NPRS by at least 3 points in order to demonstrate clinically significant reduction in pain.    Baseline  4/4: 10/10 in L shoulder and wrist    Time  8    Period  Weeks    Status  New      PT LONG TERM GOAL #3   Title  Pt will be able to open a jar to each side successfully without pain in order to complete ADLs    Baseline  4/4 unable    Time  8    Period  Weeks    Status  New      PT LONG TERM GOAL #4   Title  Patient will demonstrate L grip strength of 15kg to return to PLOF in order to complete ADLs    Baseline  4/4 8kg    Time  8    Period  Weeks    Status  New              Patient will benefit from skilled therapeutic intervention in order to improve the following deficits and impairments:     Visit Diagnosis: Chronic left shoulder pain     Problem List Patient Active Problem List   Diagnosis Date Noted  . Lymphedema 01/17/2016  . Venous (peripheral) insufficiency 01/17/2016  . Pain in limb 01/17/2016  . Iron deficiency anemia due to chronic blood loss 01/09/2016  . History of endometrial cancer 08/01/2015  . Endometrial adenocarcinoma ([place]) 07/13/2015    Class: History of  . Inguinal lymphadenopathy 07/13/2015  . Long term current use of insulin ([place]) 12/12/2013  . Microalbuminuria 12/12/2013  . Diabetes ([place]) 07/28/2013  . BP (high blood pressure) 07/28/2013  . HLD (hyperlipidemia) 07/28/2013  . Adiposity 07/28/2013  . Obstructive apnea 07/28/2013  . Arthritis, degenerative  07/28/2013  . Apnea, sleep 07/28/2013  . Thyroid nodule 07/28/2013   [name] PT, DPT [facility]  Sabra Heck 07/20/2017, 3:21 PM  [place] [facility] [address] [place], [address] Phone: [phone]   Fax:  [phone]  Name: Christine Lawson MRN: [id number] Date of Birth: Oct 23, 1960

## 2017-07-22 ENCOUNTER — Ambulatory Visit: Payer: Medicare Other | Admitting: Occupational Therapy

## 2017-07-22 ENCOUNTER — Encounter: Payer: Self-pay | Admitting: Physical Therapy

## 2017-07-22 ENCOUNTER — Ambulatory Visit: Payer: Medicare Other | Admitting: Physical Therapy

## 2017-07-22 DIAGNOSIS — M25512 Pain in left shoulder: Principal | ICD-10-CM

## 2017-07-22 DIAGNOSIS — M6281 Muscle weakness (generalized): Secondary | ICD-10-CM

## 2017-07-22 DIAGNOSIS — G8929 Other chronic pain: Secondary | ICD-10-CM

## 2017-07-22 DIAGNOSIS — M25532 Pain in left wrist: Secondary | ICD-10-CM

## 2017-07-22 DIAGNOSIS — M79642 Pain in left hand: Secondary | ICD-10-CM

## 2017-07-22 DIAGNOSIS — M25632 Stiffness of left wrist, not elsewhere classified: Secondary | ICD-10-CM

## 2017-07-22 DIAGNOSIS — M25612 Stiffness of left shoulder, not elsewhere classified: Secondary | ICD-10-CM | POA: Diagnosis not present

## 2017-07-22 NOTE — Therapy (Signed)
[facility] [address], [place], [address] Phone: [phone]   Fax:  [phone]  Occupational Therapy Treatment  Patient Details  Name: Christine Lawson MRN: [id number] Date of Birth: 1960/11/06 Referring Provider: Arvella Nigh   Encounter Date: 07/22/2017  OT End of Session - 07/22/17 1743    Visit Number  2    Number of Visits  12    Date for OT Re-Evaluation  08/18/17    OT Start Time  1245    OT Stop Time  1324    OT Time Calculation (min)  39 min    Activity Tolerance  Treatment limited secondary to medical complications (Comment)    Behavior During Therapy  [facility] for tasks assessed/performed;Impulsive       Past Medical History:  Diagnosis Date  . Diabetes mellitus without complication ([place])   . Endometrial adenocarcinoma ([place])   . GERD (gastroesophageal reflux disease)   . Hematochezia   . Hyperlipidemia   . Hypertension   . IDA (iron deficiency anemia)   . Inguinal lymphadenopathy 07/13/2015  . Irritable bowel syndrome   . Obesity   . Osteoarthritis   . Sleep apnea     Past Surgical History:  Procedure Laterality Date  . ABDOMINAL HYSTERECTOMY  feb 2013  . COLONOSCOPY WITH PROPOFOL N/A 08/02/2015   Procedure: COLONOSCOPY WITH PROPOFOL;  Surgeon: Manya Silvas, MD;  Location: [facility] ENDOSCOPY;  Service: Endoscopy;  Laterality: N/A;  . ESOPHAGOGASTRODUODENOSCOPY (EGD) WITH PROPOFOL N/A 08/02/2015   Procedure: ESOPHAGOGASTRODUODENOSCOPY (EGD) WITH PROPOFOL;  Surgeon: Manya Silvas, MD;  Location: [facility] ENDOSCOPY;  Service: Endoscopy;  Laterality: N/A;    There were no vitals filed for this visit.  Subjective Assessment - 07/22/17 1738    Subjective   Doing little better- did my exercises -and wear my splint when out -taking it off when sitting at home - still having aid to bath me - she comes for 4 hrs about daily - pain little better but still swelling at my wrist     Patient Stated Goals   I want to be able to use my hand like before - dressing , bathing , cutting food, lift and carry objects     Currently in Pain?  Yes    Pain Score  5     Pain Location  Wrist    Pain Orientation  Left    Pain Descriptors / Indicators  Aching    Pain Type  Acute pain    Pain Onset  More than a month ago    Pain Frequency  Constant         [facility] OT Assessment - 07/22/17 0001      AROM   Left Forearm Supination  65 Degrees    Left Wrist Extension  53 Degrees    Left Wrist Flexion  90 Degrees    Left Wrist Radial Deviation  24 Degrees    Left Wrist Ulnar Deviation  32 Degrees      Strength   Right Hand Grip (lbs)  35    Right Hand Lateral Pinch  12 lbs    Right Hand 3 Point Pinch  12 lbs    Left Hand Grip (lbs)  24    Left Hand Lateral Pinch  12 lbs      Left Hand AROM   L Index  MCP 0-90  80 Degrees    L Index PIP 0-100  95 Degrees    L  Long  MCP 0-90  85 Degrees    L Long PIP 0-100  95 Degrees    L Ring  MCP 0-90  80 Degrees    L Ring PIP 0-100  95 Degrees    L Little  MCP 0-90  85 Degrees    L Little PIP 0-100  90 Degrees       measurements taken - see flowsheet  Progress in digits and wrist AROM   but edema at wrist still increase 2 cm  And tender over volar and dorsal wrist         OT Treatments/Exercises (OP) - 07/22/17 0001      LUE Contrast Bath   Time  11 minutes    Comments  prior to soft tissue mobs - decrease aind edema        soft tissue massage done over volar and dorsal wrist and forearm - tender and some trigger points - attempted some graston tool nr 2 but pain and tenderness - pt and sister to help wit some soft tissue massage on forearm   Review AROM for tendon glides   wrist AROM in all planes  Add and review isometric strengthening - neutral position  10 reps   pain free   to do 10 reps   1-2 x day  Prior to HEP contrast still to be done  Fitted with neoprene soft Benik for painfull act or out of house act  Pt to start doing 50% of  her bathing -      OT Education - 07/22/17 1743    Education provided  Yes    Education Details  HEP upgrade and splint wearing     Person(s) Educated  Patient    Methods  Explanation;Demonstration    Comprehension  Verbalized understanding;Returned demonstration       OT Short Term Goals - 07/07/17 1824      OT SHORT TERM GOAL #1   Title  Pain on L wrist decrease to less than 5/10 at rest to wean out of splint     Baseline  splint more than 50% during day ,  night time - and pain between 7-10/10 pe rpt     Time  3    Period  Weeks    Status  New    Target Date  07/28/17      OT SHORT TERM GOAL #2   Title  Pt to be ind in HEP to decrease pain , increase AROM in wrist in all planes and increase grip     Baseline  no knowledge    Time  2    Period  Weeks    Status  New    Target Date  07/21/17        OT Long Term Goals - 07/07/17 1826      OT LONG TERM GOAL #1   Title  Pt wrist AROM and strength increase for pt to use L hand in more than 50% of bathing , cut food, turn doorknob, without increase symptoms     Baseline  see flowsheet     Time  5    Period  Weeks    Status  New    Target Date  08/11/17      OT LONG TERM GOAL #2   Title  L grip  increase with more than 5 lbs and prehension strength increase with more than 3 lbs to use hand in cooking , laudry and cut food  Baseline  L grip 18 , R 35, Lat/ 3 point grip L 7 lbs and R 12     Time  6    Period  Weeks    Status  New    Target Date  08/18/17            Plan - 07/22/17 1743    Clinical Impression Statement  Pt cont to show increase edema over dorsal wrist of 2 cm - but pain decrease from 7 to 5/10 - pt show increase AROM at wrist in all planes and digits flexion - pt to wean out of prefab hard splint  into neoprene  soft splint  for act that cause pain and outside of house  - initiated this date isometric strenthening to wrist     Occupational performance deficits (Please refer to evaluation for  details):  ADL's;IADL's;Play;Leisure    Rehab Potential  Fair    Current Impairments/barriers affecting progress:  co-morbitities     OT Frequency  1x / week    OT Duration  4 weeks    OT Treatment/Interventions  Self-care/ADL training;Iontophoresis;Therapeutic exercise;Ultrasound;Contrast Bath;Fluidtherapy;Manual Therapy;Splinting;Patient/family education    Plan  assess progress and upgrade HEP as needed     Clinical Decision Making  Multiple treatment options, significant modification of task necessary    OT Home Exercise Plan  See pt instruction    Consulted and Agree with Plan of Care  Patient       Patient will benefit from skilled therapeutic intervention in order to improve the following deficits and impairments:  Pain, Impaired flexibility, Increased edema, Decreased strength, Decreased range of motion, Impaired UE functional use  Visit Diagnosis: Pain in left wrist  Stiffness of left wrist, not elsewhere classified  Muscle weakness (generalized)  Pain in left hand    Problem List Patient Active Problem List   Diagnosis Date Noted  . Lymphedema 01/17/2016  . Venous (peripheral) insufficiency 01/17/2016  . Pain in limb 01/17/2016  . Iron deficiency anemia due to chronic blood loss 01/09/2016  . History of endometrial cancer 08/01/2015  . Endometrial adenocarcinoma ([place]) 07/13/2015    Class: History of  . Inguinal lymphadenopathy 07/13/2015  . Long term current use of insulin ([place]) 12/12/2013  . Microalbuminuria 12/12/2013  . Diabetes ([place]) 07/28/2013  . BP (high blood pressure) 07/28/2013  . HLD (hyperlipidemia) 07/28/2013  . Adiposity 07/28/2013  . Obstructive apnea 07/28/2013  . Arthritis, degenerative 07/28/2013  . Apnea, sleep 07/28/2013  . Thyroid nodule 07/28/2013    Rosalyn Gess OTR/L,CLT 07/22/2017, 5:46 PM  [facility] [address] [place], [address] Phone: [phone]    Fax:  [phone]  Name: Christine Lawson MRN: [id number] Date of Birth: [birth year]

## 2017-07-22 NOTE — Patient Instructions (Signed)
Cont with contrast Tendon glides  Wrist AROM in all planes  and add isometric strengthening for RD,UD, flexion and exteniton -neutral position Wean to Benik neoprene splint to use out side of house or act that cause pain or strain

## 2017-07-22 NOTE — Therapy (Signed)
[facility] [address] [place], [address] Phone: [phone]   Fax:  [phone]  Physical Therapy Treatment  Patient Details  Name: Christine Lawson MRN: [id number] Date of Birth: 1960/04/06 Referring Provider: Arvella Nigh   Encounter Date: 07/22/2017  PT End of Session - 07/22/17 1307    Visit Number  14    Number of Visits  17    Date for PT Re-Evaluation  07/29/17    PT Start Time  0100    PT Stop Time  0145    PT Time Calculation (min)  45 min    Activity Tolerance  Patient tolerated treatment well;Patient limited by pain    Behavior During Therapy  [facility] for tasks assessed/performed;Impulsive       Past Medical History:  Diagnosis Date  . Diabetes mellitus without complication ([place])   . Endometrial adenocarcinoma ([place])   . GERD (gastroesophageal reflux disease)   . Hematochezia   . Hyperlipidemia   . Hypertension   . IDA (iron deficiency anemia)   . Inguinal lymphadenopathy 07/13/2015  . Irritable bowel syndrome   . Obesity   . Osteoarthritis   . Sleep apnea     Past Surgical History:  Procedure Laterality Date  . ABDOMINAL HYSTERECTOMY  feb 2013  . COLONOSCOPY WITH PROPOFOL N/A 08/02/2015   Procedure: COLONOSCOPY WITH PROPOFOL;  Surgeon: Manya Silvas, MD;  Location: [facility] ENDOSCOPY;  Service: Endoscopy;  Laterality: N/A;  . ESOPHAGOGASTRODUODENOSCOPY (EGD) WITH PROPOFOL N/A 08/02/2015   Procedure: ESOPHAGOGASTRODUODENOSCOPY (EGD) WITH PROPOFOL;  Surgeon: Manya Silvas, MD;  Location: [facility] ENDOSCOPY;  Service: Endoscopy;  Laterality: N/A;    There were no vitals filed for this visit.  Subjective Assessment - 07/22/17 1305    Subjective  Patient reports 5/10 pain in L shoulder today. Patient reports she is completing her HEP everyday. Patient reports her motion is getting better.     Pertinent History  Patient is 57 year old female that reports a fall resulting in a chair falling onto L  shoulder and wrist at the beginning of March. Patient reports that MD instructed her to wear shoulder sling as needed in community and follow up appointment Dr. Theresa Mulligan (3/27) instructed patient to wear L wrist brace for wrist sprain. Patient is scheduled for MRI 4/8 for shoulder and wrist. Patient reports worst pain in L shoulder 10/10 in past week, worst pain in L wrist 10/10. Patient reports the best her pain has been in the R shoulder in the past week: 8/10 wrist:  9/10    Limitations  House hold activities;Lifting;Writing;Sitting;Standing;Walking    How long can you sit comfortably?  Pain in all positions    How long can you stand comfortably?  Pain in all positions    How long can you walk comfortably?  Pain in all positions    Diagnostic tests  MRI scheduled 4/8    Patient Stated Goals  decrease pain    Pain Onset  More than a month ago    Pain Onset  1 to 4 weeks ago           Ther-Ex -Pulleys 15x flexion 15x abd for warm up -Empty can abd 2# wt 3x 10 with cuing for proper posture and eccentric control -Standing lat pull downs 3x 10 15# with cuing for eccentric control  -High Rows 3x 10 15# with cuing for proper ROM of exercise -Neutral rows 3x 10 15# with min cuing needed -Serratus punch  3x 10 w/ 3# w/ TC needed throughout for proper form  Manual -STM w/ trigger point release to L latissimus/teres minor with less withdrawal response than before but continued to be very TTP -STM w/ trigger point release to L bicep -Multiple bouts of PROM into abd/flex/IR/ER with 2-3 sec holds at end range, increasing range as able. Measurements below:   Flexion: 137d Abduction: 131d ER: 87d IR: 90d                     PT Education - 07/22/17 1325    Education provided  Yes    Education Details  Exercise form    Person(s) Educated  Patient    Methods  Explanation;Demonstration;Tactile cues;Verbal cues    Comprehension  Verbal cues required;Returned  demonstration;Verbalized understanding;Tactile cues required       PT Short Term Goals - 06/04/17 0938      PT SHORT TERM GOAL #1   Title  Pt will be independent with HEP in order to improve strength and balance in order to decrease fall risk and improve function at home and work.    Time  2    Period  Weeks    Status  New    Target Date  06/18/17        PT Long Term Goals - 06/04/17 1230      PT LONG TERM GOAL #1   Title  Patient will increase FOTO score to 56 to demonstrate predicted increase in functional mobility to complete ADLs    Baseline  4/4 36    Time  8    Period  Weeks    Status  New      PT LONG TERM GOAL #2   Title  Pt will decrease worst pain as reported on NPRS by at least 3 points in order to demonstrate clinically significant reduction in pain.    Baseline  4/4: 10/10 in L shoulder and wrist    Time  8    Period  Weeks    Status  New      PT LONG TERM GOAL #3   Title  Pt will be able to open a jar to each side successfully without pain in order to complete ADLs    Baseline  4/4 unable    Time  8    Period  Weeks    Status  New      PT LONG TERM GOAL #4   Title  Patient will demonstrate L grip strength of 15kg to return to PLOF in order to complete ADLs    Baseline  4/4 8kg    Time  8    Period  Weeks    Status  New            Plan - 07/22/17 1327    Clinical Impression Statement  Pt is continue to demonstrate higher tolerance to therex exercise without pain. Pt still requires cuing from PT throughout therex, but is able to complete all exercises with accuracy with PT cuing. Patient is continuing to have tender trigger points in L bicep, latissimus and teres minor, but is able to tolerate more manual techniques to this area as well.     Clinical Impairments Affecting Rehab Potential  (-) transportation, multiple comorbidities (DM II, obesity, GERD, communication barrier, sedentary lifestyle (+) positive attitude,     PT Frequency  2x / week     PT Duration  8 weeks    PT Treatment/Interventions  Neuromuscular re-education;Passive range of motion;Manual techniques;Patient/family education;Taping;Therapeutic exercise;Therapeutic activities;Functional mobility training;Moist Heat;Ultrasound;Cryotherapy;Electrical Stimulation    PT Next Visit Plan  Continue AAROM/PROM, continue RTC strengthening as able    PT Home Exercise Plan  Cane assisted ER/abd/flex, scap retractions, wrist AAROM with other hand into flex/ext, desensitization to L shoulder (brushing)    Consulted and Agree with Plan of Care  Patient       Patient will benefit from skilled therapeutic intervention in order to improve the following deficits and impairments:  Increased fascial restricitons, Pain, Improper body mechanics, Increased muscle spasms, Impaired tone, Postural dysfunction, Decreased activity tolerance, Decreased endurance, Decreased range of motion, Decreased strength, Impaired flexibility  Visit Diagnosis: Chronic left shoulder pain     Problem List Patient Active Problem List   Diagnosis Date Noted  . Lymphedema 01/17/2016  . Venous (peripheral) insufficiency 01/17/2016  . Pain in limb 01/17/2016  . Iron deficiency anemia due to chronic blood loss 01/09/2016  . History of endometrial cancer 08/01/2015  . Endometrial adenocarcinoma ([place]) 07/13/2015    Class: History of  . Inguinal lymphadenopathy 07/13/2015  . Long term current use of insulin ([place]) 12/12/2013  . Microalbuminuria 12/12/2013  . Diabetes ([place]) 07/28/2013  . BP (high blood pressure) 07/28/2013  . HLD (hyperlipidemia) 07/28/2013  . Adiposity 07/28/2013  . Obstructive apnea 07/28/2013  . Arthritis, degenerative 07/28/2013  . Apnea, sleep 07/28/2013  . Thyroid nodule 07/28/2013   [name] PT, DPT [name] 07/22/2017, 1:49 PM  [facility] [address], [place], [address] Phone: [phone]   Fax:   [phone]  Name: Christine Lawson MRN: [id number] Date of Birth: 1960/09/04

## 2017-07-29 ENCOUNTER — Ambulatory Visit: Payer: Medicare Other | Admitting: Occupational Therapy

## 2017-07-29 ENCOUNTER — Ambulatory Visit: Payer: Medicare Other | Admitting: Physical Therapy

## 2017-07-29 ENCOUNTER — Encounter: Payer: Self-pay | Admitting: Physical Therapy

## 2017-07-29 DIAGNOSIS — M25532 Pain in left wrist: Secondary | ICD-10-CM

## 2017-07-29 DIAGNOSIS — M79642 Pain in left hand: Secondary | ICD-10-CM

## 2017-07-29 DIAGNOSIS — M6281 Muscle weakness (generalized): Secondary | ICD-10-CM

## 2017-07-29 DIAGNOSIS — G8929 Other chronic pain: Secondary | ICD-10-CM

## 2017-07-29 DIAGNOSIS — M25632 Stiffness of left wrist, not elsewhere classified: Secondary | ICD-10-CM

## 2017-07-29 DIAGNOSIS — M25612 Stiffness of left shoulder, not elsewhere classified: Secondary | ICD-10-CM | POA: Diagnosis not present

## 2017-07-29 DIAGNOSIS — M25512 Pain in left shoulder: Principal | ICD-10-CM

## 2017-07-29 NOTE — Patient Instructions (Signed)
Add 16oz hammer for wrist HEP  In all planes 10 reps   2 x day  And then in 3 days can increase to 2 sets  And to use hand more -in dressing and bathing   and grasping objects if pain less than 4/10

## 2017-07-29 NOTE — Therapy (Signed)
[facility] [address], [place], [address] Phone: [phone]   Fax:  [phone]  Occupational Therapy Treatment  Patient Details  Name: Christine Lawson MRN: [id number] Date of Birth: 11/14/1960 Referring Provider: Arvella Nigh   Encounter Date: 07/29/2017  OT End of Session - 07/29/17 1408    Visit Number  3    Number of Visits  12    Date for OT Re-Evaluation  08/18/17    OT Start Time  1210    OT Stop Time  1250    OT Time Calculation (min)  40 min    Activity Tolerance  Patient tolerated treatment well    Behavior During Therapy  [facility] for tasks assessed/performed;Impulsive       Past Medical History:  Diagnosis Date  . Diabetes mellitus without complication ([place])   . Endometrial adenocarcinoma ([place])   . GERD (gastroesophageal reflux disease)   . Hematochezia   . Hyperlipidemia   . Hypertension   . IDA (iron deficiency anemia)   . Inguinal lymphadenopathy 07/13/2015  . Irritable bowel syndrome   . Obesity   . Osteoarthritis   . Sleep apnea     Past Surgical History:  Procedure Laterality Date  . ABDOMINAL HYSTERECTOMY  feb 2013  . COLONOSCOPY WITH PROPOFOL N/A 08/02/2015   Procedure: COLONOSCOPY WITH PROPOFOL;  Surgeon: Manya Silvas, MD;  Location: [facility] ENDOSCOPY;  Service: Endoscopy;  Laterality: N/A;  . ESOPHAGOGASTRODUODENOSCOPY (EGD) WITH PROPOFOL N/A 08/02/2015   Procedure: ESOPHAGOGASTRODUODENOSCOPY (EGD) WITH PROPOFOL;  Surgeon: Manya Silvas, MD;  Location: [facility] ENDOSCOPY;  Service: Endoscopy;  Laterality: N/A;    There were no vitals filed for this visit.  Subjective Assessment - 07/29/17 1232    Subjective   Seen DR and had xray repeat- plan to do MRI - checking to see if maybe facture in that one bone at base of thumb-  I did help little with bathing - pain and use getting better      Patient Stated Goals  I want to be able to use my hand like before - dressing , bathing ,  cutting food, lift and carry objects     Currently in Pain?  Yes    Pain Score  4     Pain Location  Wrist    Pain Orientation  Left    Pain Descriptors / Indicators  Aching       Pt report no increase pain with AROM for L wrist in all planes  appear to be same as R wrist  Pt also no pain with resistance to wrist in all planes   but weak  Pt  Seen PA and plan to order MRI for possible further assessment of scaphoid              OT Treatments/Exercises (OP) - 07/29/17 0001      LUE Fluidotherapy   Number Minutes Fluidotherapy  10 Minutes    LUE Fluidotherapy Location  Hand;Wrist    Comments  prior to ROM and  to decrease pain       16 oz hammer HEP done and review with pt - for wrist in all planes - hold close to head 10 reps   2 x day  Increase to 2 sets in 3 days if no increase pain  Pt ed on using L hand and arm in ADL's  For bathing and dressing- to modify donning and doffing over head shirt  And pants  Pt to report increase use next time - and can reach , carry and lift objects less than 2 lbs if no increase pain  Use benik splint as needed       OT Education - 07/29/17 1408    Education provided  Yes    Education Details  HEP and add strengthening - review bathing and dressing use L hand more     Person(s) Educated  Patient sister   sister   Methods  Explanation;Demonstration    Comprehension  Verbalized understanding;Returned demonstration       OT Short Term Goals - 07/07/17 1824      OT SHORT TERM GOAL #1   Title  Pain on L wrist decrease to less than 5/10 at rest to wean out of splint     Baseline  splint more than 50% during day ,  night time - and pain between 7-10/10 pe rpt     Time  3    Period  Weeks    Status  New    Target Date  07/28/17      OT SHORT TERM GOAL #2   Title  Pt to be ind in HEP to decrease pain , increase AROM in wrist in all planes and increase grip     Baseline  no knowledge    Time  2    Period  Weeks    Status   New    Target Date  07/21/17        OT Long Term Goals - 07/07/17 1826      OT LONG TERM GOAL #1   Title  Pt wrist AROM and strength increase for pt to use L hand in more than 50% of bathing , cut food, turn doorknob, without increase symptoms     Baseline  see flowsheet     Time  5    Period  Weeks    Status  New    Target Date  08/11/17      OT LONG TERM GOAL #2   Title  L grip  increase with more than 5 lbs and prehension strength increase with more than 3 lbs to use hand in cooking , laudry and cut food     Baseline  L grip 18 , R 35, Lat/ 3 point grip L 7 lbs and R 12     Time  6    Period  Weeks    Status  New    Target Date  08/18/17            Plan - 07/29/17 1409    Clinical Impression Statement  Pt cont to make progress in pain , AROM - and able to inititate this date 1 lbs or 16oz hammer HEP for wrist not increase pain - pt also ed on using hand and L arm more in bathing and dressing     Occupational performance deficits (Please refer to evaluation for details):  ADL's;IADL's;Play;Leisure    Rehab Potential  Fair    Current Impairments/barriers affecting progress:  co-morbitities     OT Frequency  1x / week    OT Duration  4 weeks    OT Treatment/Interventions  Self-care/ADL training;Iontophoresis;Therapeutic exercise;Ultrasound;Contrast Bath;Fluidtherapy;Manual Therapy;Splinting;Patient/family education    Plan  assess progress and upgrade HEP as needed     Clinical Decision Making  Multiple treatment options, significant modification of task necessary    OT Home Exercise Plan  See pt instruction    Consulted and Agree  with Plan of Care  Patient       Patient will benefit from skilled therapeutic intervention in order to improve the following deficits and impairments:  Pain, Impaired flexibility, Increased edema, Decreased strength, Decreased range of motion, Impaired UE functional use  Visit Diagnosis: Pain in left wrist  Stiffness of left wrist, not  elsewhere classified  Muscle weakness (generalized)  Pain in left hand    Problem List Patient Active Problem List   Diagnosis Date Noted  . Lymphedema 01/17/2016  . Venous (peripheral) insufficiency 01/17/2016  . Pain in limb 01/17/2016  . Iron deficiency anemia due to chronic blood loss 01/09/2016  . History of endometrial cancer 08/01/2015  . Endometrial adenocarcinoma ([place]) 07/13/2015    Class: History of  . Inguinal lymphadenopathy 07/13/2015  . Long term current use of insulin ([place]) 12/12/2013  . Microalbuminuria 12/12/2013  . Diabetes ([place]) 07/28/2013  . BP (high blood pressure) 07/28/2013  . HLD (hyperlipidemia) 07/28/2013  . Adiposity 07/28/2013  . Obstructive apnea 07/28/2013  . Arthritis, degenerative 07/28/2013  . Apnea, sleep 07/28/2013  . Thyroid nodule 07/28/2013    Rosalyn Gess OTR/L,CLT 07/29/2017, 2:11 PM  [place] [place] [facility] [address], [place], [address] Phone: [phone]   Fax:  [phone]  Name: Christine Lawson MRN: [id number] Date of Birth: 07/26/1960

## 2017-07-29 NOTE — Therapy (Signed)
[facility] [address], [place], [address] Phone: [phone]   Fax:  [phone]  Physical Therapy Treatment  Patient Details  Name: Christine Lawson MRN: [id number] Date of Birth: 15-Oct-1960 Referring Provider: Arvella Nigh   Encounter Date: 07/29/2017  PT End of Session - 07/29/17 1307    Visit Number  15    Number of Visits  29    Date for PT Re-Evaluation  09/09/17    PT Start Time  0100    PT Stop Time  0145    PT Time Calculation (min)  45 min    Activity Tolerance  Patient tolerated treatment well;Patient limited by pain    Behavior During Therapy  [facility] for tasks assessed/performed;Impulsive       Past Medical History:  Diagnosis Date  . Diabetes mellitus without complication ([place])   . Endometrial adenocarcinoma ([place])   . GERD (gastroesophageal reflux disease)   . Hematochezia   . Hyperlipidemia   . Hypertension   . IDA (iron deficiency anemia)   . Inguinal lymphadenopathy 07/13/2015  . Irritable bowel syndrome   . Obesity   . Osteoarthritis   . Sleep apnea     Past Surgical History:  Procedure Laterality Date  . ABDOMINAL HYSTERECTOMY  feb 2013  . COLONOSCOPY WITH PROPOFOL N/A 08/02/2015   Procedure: COLONOSCOPY WITH PROPOFOL;  Surgeon: Manya Silvas, MD;  Location: [facility] ENDOSCOPY;  Service: Endoscopy;  Laterality: N/A;  . ESOPHAGOGASTRODUODENOSCOPY (EGD) WITH PROPOFOL N/A 08/02/2015   Procedure: ESOPHAGOGASTRODUODENOSCOPY (EGD) WITH PROPOFOL;  Surgeon: Manya Silvas, MD;  Location: [facility] ENDOSCOPY;  Service: Endoscopy;  Laterality: N/A;    There were no vitals filed for this visit.  Subjective Assessment - 07/29/17 1305    Subjective  Patient reports 4/10 pain today in L shoulder, and reports she is starting to feel "much better" and is being deligent about her HEP.     Pertinent History  Patient is 57 year old female that reports a fall resulting in a chair falling onto L shoulder and  wrist at the beginning of March. Patient reports that MD instructed her to wear shoulder sling as needed in community and follow up appointment Dr. Theresa Mulligan (3/27) instructed patient to wear L wrist brace for wrist sprain. Patient is scheduled for MRI 4/8 for shoulder and wrist. Patient reports worst pain in L shoulder 10/10 in past week, worst pain in L wrist 10/10. Patient reports the best her pain has been in the R shoulder in the past week: 8/10 wrist:  9/10    Limitations  House hold activities;Lifting;Writing;Sitting;Standing;Walking    How long can you sit comfortably?  Pain in all positions    How long can you stand comfortably?  Pain in all positions    How long can you walk comfortably?  Pain in all positions    Diagnostic tests  MRI scheduled 4/8    Patient Stated Goals  decrease pain    Pain Onset  More than a month ago       Ther-Ex -Empty can shoulder abd in standing 3x 12 1# with min cuing for eccentric control -Low rows with red tband 1x 12 with blue tband 2x 10 with cuing to maintain elbow ext -Neutral rows with blue tband 2x 10 with cuing for eccentric control and to prevent shoulder hiking (HEP) -Standing IR blue tband 2x 10 with cuing to prevent trunk rotation compensation (HEP) -Standing ER blue tband 2x 10 with cuing  for eccentric control (HEP)  MMT:  Shoulder flex L4/5 R 4+/5   Shoulder ext L 5/5 R 4+/5 Shoulder abd L 4/5 R 4+/5 Shoulder IR L 4+/5  R 4/5 Shoulder ER L 5/5 R 4/5   Manual -Multiple PROM bouts into flex/abd/IR/ER with 3-5 sec holds increasing ROM each time, focus on ER/IR as these are the motions patient is continuing to have the most trouble with -STM with trigger point release to L teres minor and latissimus                     PT Education - 07/29/17 1307    Education provided  Yes    Education Details  Exercise form; HEP revision    Person(s) Educated  Patient    Methods  Explanation;Demonstration;Verbal cues;Tactile cues     Comprehension  Returned demonstration;Verbal cues required;Verbalized understanding       PT Short Term Goals - 06/04/17 1829      PT SHORT TERM GOAL #1   Title  Pt will be independent with HEP in order to improve strength and balance in order to decrease fall risk and improve function at home and work.    Time  2    Period  Weeks    Status  New    Target Date  06/18/17        PT Long Term Goals - 07/29/17 1309      PT LONG TERM GOAL #1   Title  Patient will increase FOTO score to 56 to demonstrate predicted increase in functional mobility to complete ADLs    Baseline  5/29 62    Time  8    Period  Weeks    Status  Achieved      PT LONG TERM GOAL #2   Title  Pt will decrease worst pain as reported on NPRS by at least 3 points in order to demonstrate clinically significant reduction in pain.    Baseline  07/29/17 4/10     Time  8    Period  Weeks    Status  Achieved      PT LONG TERM GOAL #3   Title  Pt will be able to open a jar to each side successfully without pain in order to complete ADLs    Baseline  5/29 unable d/t wrist pain    Time  8    Status  Not Met      PT LONG TERM GOAL #4   Title  Patient will demonstrate L grip strength of 15kg to return to PLOF in order to complete ADLs    Baseline  5/29 10kg    Time  8    Period  Weeks    Status  On-going      PT LONG TERM GOAL #5   Title  Patient will demonstrate symmetrical shoulder strength in order to safely complete overhead ADLs    Baseline  5/29:  flex L4/5 R 4+/5; ext L 5/5 R 4+/5; abd L 4/5 R 4+/5; IR L 4+/5  R 4/5; ER L 5/5 R 4/5    Time  6    Period  Weeks    Status  New      Additional Long Term Goals   Additional Long Term Goals  Yes      PT LONG TERM GOAL #6   Title  Patient will demonstrate symmetrical shoulder apleys IR/ER in order to complete ADLs    Baseline  5/29  IR: L: L1 R: T10 ER L C2 R C*    Time  6    Period  Weeks    Status  New            Plan - 07/29/17 1325    Clinical  Impression Statement  PT led patient through re-assessment today, where patient is demonstrating increased ROM, with some deficits mostly in IR/ER, and increased strength, but still not symmetrical to opposite side. Patient has demonstrated decrease in pain as well, which is allowing her to complete ADL's better. PT educated patient that PT will transition to more functional RTC strengthening with maintaining motion. Patient reports she feels as though she is making good progress this far. HEP revised to more strengthening focus.     Rehab Potential  Fair    Clinical Impairments Affecting Rehab Potential  (-) transportation, multiple comorbidities (DM II, obesity, GERD, communication barrier, sedentary lifestyle (+) positive attitude,     PT Frequency  2x / week    PT Duration  8 weeks    PT Treatment/Interventions  Neuromuscular re-education;Passive range of motion;Manual techniques;Patient/family education;Taping;Therapeutic exercise;Therapeutic activities;Functional mobility training;Moist Heat;Ultrasound;Cryotherapy;Electrical Stimulation    PT Next Visit Plan  continue RTC strengthening as able    PT Home Exercise Plan  Revised 5/29: standing rows with blue tband, ER with blue tband, IR with blue band    Consulted and Agree with Plan of Care  Patient       Patient will benefit from skilled therapeutic intervention in order to improve the following deficits and impairments:  Increased fascial restricitons, Pain, Improper body mechanics, Increased muscle spasms, Impaired tone, Postural dysfunction, Decreased activity tolerance, Decreased endurance, Decreased range of motion, Decreased strength, Impaired flexibility  Visit Diagnosis: Chronic left shoulder pain     Problem List Patient Active Problem List   Diagnosis Date Noted  . Lymphedema 01/17/2016  . Venous (peripheral) insufficiency 01/17/2016  . Pain in limb 01/17/2016  . Iron deficiency anemia due to chronic blood loss 01/09/2016   . History of endometrial cancer 08/01/2015  . Endometrial adenocarcinoma ([place]) 07/13/2015    Class: History of  . Inguinal lymphadenopathy 07/13/2015  . Long term current use of insulin ([place]) 12/12/2013  . Microalbuminuria 12/12/2013  . Diabetes ([place]) 07/28/2013  . BP (high blood pressure) 07/28/2013  . HLD (hyperlipidemia) 07/28/2013  . Adiposity 07/28/2013  . Obstructive apnea 07/28/2013  . Arthritis, degenerative 07/28/2013  . Apnea, sleep 07/28/2013  . Thyroid nodule 07/28/2013   [name] PT, DPT [name] 07/29/2017, 1:47 PM  [facility] [address], [place], [address] Phone: [phone]   Fax:  [phone]  Name: Christine Lawson MRN: [id number] Date of Birth: 28-Mar-1960

## 2017-08-03 ENCOUNTER — Ambulatory Visit: Payer: Medicare Other | Admitting: Physical Therapy

## 2017-08-03 ENCOUNTER — Ambulatory Visit: Payer: Medicare Other | Admitting: Occupational Therapy

## 2017-08-05 ENCOUNTER — Ambulatory Visit: Payer: Medicare Other | Attending: Student | Admitting: Occupational Therapy

## 2017-08-05 DIAGNOSIS — M79642 Pain in left hand: Secondary | ICD-10-CM | POA: Insufficient documentation

## 2017-08-05 DIAGNOSIS — G8929 Other chronic pain: Secondary | ICD-10-CM | POA: Diagnosis present

## 2017-08-05 DIAGNOSIS — M25632 Stiffness of left wrist, not elsewhere classified: Secondary | ICD-10-CM | POA: Insufficient documentation

## 2017-08-05 DIAGNOSIS — M25532 Pain in left wrist: Secondary | ICD-10-CM | POA: Insufficient documentation

## 2017-08-05 DIAGNOSIS — M6281 Muscle weakness (generalized): Secondary | ICD-10-CM | POA: Diagnosis present

## 2017-08-05 DIAGNOSIS — M25612 Stiffness of left shoulder, not elsewhere classified: Secondary | ICD-10-CM | POA: Insufficient documentation

## 2017-08-05 DIAGNOSIS — M25512 Pain in left shoulder: Secondary | ICD-10-CM | POA: Diagnosis present

## 2017-08-05 NOTE — Therapy (Signed)
[facility] [address] [place], [address] Phone: [phone]   Fax:  [phone]  Occupational Therapy Treatment  Patient Details  Name: Christine Lawson MRN: [id number] Date of Birth: 13-Feb-1961 Referring Provider: Arvella Nigh   Encounter Date: 08/05/2017  OT End of Session - 08/05/17 1438    Visit Number  4    Number of Visits  12    Date for OT Re-Evaluation  08/18/17    OT Start Time  1243    OT Stop Time  1325    OT Time Calculation (min)  42 min    Activity Tolerance  Patient tolerated treatment well    Behavior During Therapy  [facility] for tasks assessed/performed;Impulsive       Past Medical History:  Diagnosis Date  . Diabetes mellitus without complication ([place])   . Endometrial adenocarcinoma ([place])   . GERD (gastroesophageal reflux disease)   . Hematochezia   . Hyperlipidemia   . Hypertension   . IDA (iron deficiency anemia)   . Inguinal lymphadenopathy 07/13/2015  . Irritable bowel syndrome   . Obesity   . Osteoarthritis   . Sleep apnea     Past Surgical History:  Procedure Laterality Date  . ABDOMINAL HYSTERECTOMY  feb 2013  . COLONOSCOPY WITH PROPOFOL N/A 08/02/2015   Procedure: COLONOSCOPY WITH PROPOFOL;  Surgeon: Manya Silvas, MD;  Location: [facility] ENDOSCOPY;  Service: Endoscopy;  Laterality: N/A;  . ESOPHAGOGASTRODUODENOSCOPY (EGD) WITH PROPOFOL N/A 08/02/2015   Procedure: ESOPHAGOGASTRODUODENOSCOPY (EGD) WITH PROPOFOL;  Surgeon: Manya Silvas, MD;  Location: [facility] ENDOSCOPY;  Service: Endoscopy;  Laterality: N/A;    There were no vitals filed for this visit.  Subjective Assessment - 08/05/17 1435    Subjective   I am doing better- I did the 16oz hammer with no problems -about 30-45 reps each exercises - and bathing myself about 50% self , and dressing doing my shirt and pants -not really pain anymore     Patient Stated Goals  I want to be able to use my hand like before - dressing ,  bathing , cutting food, lift and carry objects     Currently in Pain?  Yes    Pain Score  3     Pain Location  Wrist    Pain Orientation  Left    Pain Descriptors / Indicators  Aching    Pain Type  Acute pain         [facility] OT Assessment - 08/05/17 0001      Strength   Right Hand Grip (lbs)  35    Right Hand Lateral Pinch  12 lbs    Right Hand 3 Point Pinch  12 lbs    Left Hand Grip (lbs)  30    Left Hand Lateral Pinch  13 lbs    Left Hand 3 Point Pinch  10 lbs         Pt report  Decrease pain to 3/10 at wrist -5/5 strength in plane  no increase pain with AROM for L wrist in all planes  appear to be same as R wrist  Pt also no pain with resistance to wrist in all planes   but weak  Pt  schedule for MRI for possible further assessment of scaphoid  next week    cone with pt PRWHE for function - pt was ablle to do all act - report her doorknob is harder , but push up from chair, cut  food, do buttons , carry 6 lbs with no issues- 7 lbs felt little heavy  Report increase use of L hand in bathing about 50% and dressing doing pants and shirt - Aid help with shoes and socks       OT Treatments/Exercises (OP) - 08/05/17 0001      LUE Fluidotherapy   Number Minutes Fluidotherapy  8 Minutes    LUE Fluidotherapy Location  Hand;Wrist    Comments  end to decrease pain        2 lbs for  HEP done and review with pt - for wrist in all planes -1-  2 x day  Increase to 2 sets in 3 days if no increase pain  Use benik splint as needed        OT Education - 08/05/17 1438    Education provided  Yes    Education Details  HEP for 2 lbs , progress and POC     Person(s) Educated  Patient    Methods  Explanation;Demonstration    Comprehension  Verbalized understanding;Returned demonstration       OT Short Term Goals - 07/07/17 1824      OT SHORT TERM GOAL #1   Title  Pain on L wrist decrease to less than 5/10 at rest to wean out of splint     Baseline  splint more than 50%  during day ,  night time - and pain between 7-10/10 pe rpt     Time  3    Period  Weeks    Status  New    Target Date  07/28/17      OT SHORT TERM GOAL #2   Title  Pt to be ind in HEP to decrease pain , increase AROM in wrist in all planes and increase grip     Baseline  no knowledge    Time  2    Period  Weeks    Status  New    Target Date  07/21/17        OT Long Term Goals - 07/07/17 1826      OT LONG TERM GOAL #1   Title  Pt wrist AROM and strength increase for pt to use L hand in more than 50% of bathing , cut food, turn doorknob, without increase symptoms     Baseline  see flowsheet     Time  5    Period  Weeks    Status  New    Target Date  08/11/17      OT LONG TERM GOAL #2   Title  L grip  increase with more than 5 lbs and prehension strength increase with more than 3 lbs to use hand in cooking , laudry and cut food     Baseline  L grip 18 , R 35, Lat/ 3 point grip L 7 lbs and R 12     Time  6    Period  Weeks    Status  New    Target Date  08/18/17            Plan - 08/05/17 1439    Clinical Impression Statement  Pt making progress in pain, strength in L wrist - increase use - and able to carry about 6 lbs without issues-  pt to cont to increase strength and decrease pain     Occupational performance deficits (Please refer to evaluation for details):  ADL's;IADL's;Play;Leisure    Rehab Potential  Fair  Current Impairments/barriers affecting progress:  co-morbitities     OT Frequency  1x / week    OT Duration  4 weeks    OT Treatment/Interventions  Self-care/ADL training;Iontophoresis;Therapeutic exercise;Ultrasound;Contrast Bath;Fluidtherapy;Manual Therapy;Splinting;Patient/family education    Plan  assess progress and possible discharge - or one more session after this one     Clinical Decision Making  Multiple treatment options, significant modification of task necessary    OT Home Exercise Plan  See pt instruction    Consulted and Agree with Plan of  Care  Patient       Patient will benefit from skilled therapeutic intervention in order to improve the following deficits and impairments:  Pain, Impaired flexibility, Increased edema, Decreased strength, Decreased range of motion, Impaired UE functional use  Visit Diagnosis: Pain in left wrist  Stiffness of left wrist, not elsewhere classified  Muscle weakness (generalized)  Pain in left hand    Problem List Patient Active Problem List   Diagnosis Date Noted  . Lymphedema 01/17/2016  . Venous (peripheral) insufficiency 01/17/2016  . Pain in limb 01/17/2016  . Iron deficiency anemia due to chronic blood loss 01/09/2016  . History of endometrial cancer 08/01/2015  . Endometrial adenocarcinoma ([place]) 07/13/2015    Class: History of  . Inguinal lymphadenopathy 07/13/2015  . Long term current use of insulin ([place]) 12/12/2013  . Microalbuminuria 12/12/2013  . Diabetes ([place]) 07/28/2013  . BP (high blood pressure) 07/28/2013  . HLD (hyperlipidemia) 07/28/2013  . Adiposity 07/28/2013  . Obstructive apnea 07/28/2013  . Arthritis, degenerative 07/28/2013  . Apnea, sleep 07/28/2013  . Thyroid nodule 07/28/2013    Rosalyn Gess OTR/L,CLT 08/05/2017, 2:44 PM  [place] [facility] [address], [place], [address] Phone: [phone]   Fax:  [phone]  Name: Christine Lawson MRN: [id number] Date of Birth: Nov 03, 1960

## 2017-08-05 NOTE — Patient Instructions (Signed)
2 lbs for wrist in all planes  15 reps   work up to 2 sets each  [place] as needed  And ice and heat as needed

## 2017-08-11 ENCOUNTER — Ambulatory Visit: Payer: Medicare Other | Admitting: Physical Therapy

## 2017-08-11 ENCOUNTER — Encounter: Payer: Self-pay | Admitting: Physical Therapy

## 2017-08-11 ENCOUNTER — Ambulatory Visit: Payer: Medicare Other | Admitting: Occupational Therapy

## 2017-08-11 DIAGNOSIS — M25532 Pain in left wrist: Secondary | ICD-10-CM

## 2017-08-11 DIAGNOSIS — M25632 Stiffness of left wrist, not elsewhere classified: Secondary | ICD-10-CM

## 2017-08-11 DIAGNOSIS — G8929 Other chronic pain: Secondary | ICD-10-CM

## 2017-08-11 DIAGNOSIS — M79642 Pain in left hand: Secondary | ICD-10-CM

## 2017-08-11 DIAGNOSIS — M25512 Pain in left shoulder: Principal | ICD-10-CM

## 2017-08-11 DIAGNOSIS — M6281 Muscle weakness (generalized): Secondary | ICD-10-CM

## 2017-08-11 DIAGNOSIS — M25612 Stiffness of left shoulder, not elsewhere classified: Secondary | ICD-10-CM

## 2017-08-11 NOTE — Therapy (Signed)
[facility] [address], [place], [address] Phone: [phone]   Fax:  [phone]  Physical Therapy Treatment  Patient Details  Name: Christine Lawson MRN: [id number] Date of Birth: October 22, 1960 Referring Provider: Arvella Nigh   Encounter Date: 08/11/2017  PT End of Session - 08/11/17 1325    Visit Number  16    Number of Visits  29    Date for PT Re-Evaluation  09/09/17    PT Start Time  0100    PT Stop Time  0145    PT Time Calculation (min)  45 min    Activity Tolerance  Patient tolerated treatment well;Patient limited by pain    Behavior During Therapy  [facility] for tasks assessed/performed;Impulsive       Past Medical History:  Diagnosis Date  . Diabetes mellitus without complication ([place])   . Endometrial adenocarcinoma ([place])   . GERD (gastroesophageal reflux disease)   . Hematochezia   . Hyperlipidemia   . Hypertension   . IDA (iron deficiency anemia)   . Inguinal lymphadenopathy 07/13/2015  . Irritable bowel syndrome   . Obesity   . Osteoarthritis   . Sleep apnea     Past Surgical History:  Procedure Laterality Date  . ABDOMINAL HYSTERECTOMY  feb 2013  . COLONOSCOPY WITH PROPOFOL N/A 08/02/2015   Procedure: COLONOSCOPY WITH PROPOFOL;  Surgeon: Manya Silvas, MD;  Location: [facility] ENDOSCOPY;  Service: Endoscopy;  Laterality: N/A;  . ESOPHAGOGASTRODUODENOSCOPY (EGD) WITH PROPOFOL N/A 08/02/2015   Procedure: ESOPHAGOGASTRODUODENOSCOPY (EGD) WITH PROPOFOL;  Surgeon: Manya Silvas, MD;  Location: [facility] ENDOSCOPY;  Service: Endoscopy;  Laterality: N/A;    There were no vitals filed for this visit.  Subjective Assessment - 08/11/17 1305    Subjective  Patient reports 3/10 pain in the L shoulder today. Patient reports she is trying to use the L shoulder and hand more often and to try to not guard the shoulder as much. Patient reports compliance with her HEP     Pertinent History  Patient is 57 year old  female that reports a fall resulting in a chair falling onto L shoulder and wrist at the beginning of March. Patient reports that MD instructed her to wear shoulder sling as needed in community and follow up appointment Dr. Theresa Mulligan (3/27) instructed patient to wear L wrist brace for wrist sprain. Patient is scheduled for MRI 4/8 for shoulder and wrist. Patient reports worst pain in L shoulder 10/10 in past week, worst pain in L wrist 10/10. Patient reports the best her pain has been in the R shoulder in the past week: 8/10 wrist:  9/10    Limitations  House hold activities;Lifting;Writing;Sitting;Standing;Walking    How long can you sit comfortably?  Pain in all positions    How long can you stand comfortably?  Pain in all positions    How long can you walk comfortably?  Pain in all positions    Diagnostic tests  MRI scheduled 4/8    Patient Stated Goals  decrease pain    Pain Onset  1 to 4 weeks ago          Ther-Ex -Pulleys 10x flex 10x abd with full AAROM with stretch at end range with patient reporting she "understands why PT does manual at latissimus" -Standing shoulder abd 1x 12 2# 2x 10 3# -Standing shoulder flex 1x 12 2# 2x 10 3# -Seated military press 3x 10 3# with TC and VC needed initially for  proper form with proper ROM -Attempted total gym tricep pulldowns, patient unable  with proper form/safely so discontinued.  -Low row with maintained elbow ext 3x 10 with blue tband with cuing for tricep activation -Seated latissimus stretch 1x 30sec hold and doorway latissimus stretch 1x 30sec hold with education on adding these stretches to HEP to maintain soft tissue lengthening made with manual techniques   Manual -STM with trigger point release to R latissimus with 50% tissue release following, with education on stretching to continue tissue lengthening here.                   PT Education - 08/11/17 1322    Education provided  Yes    Education Details  Exercise form     Person(s) Educated  Patient    Methods  Explanation;Tactile cues;Verbal cues;Demonstration    Comprehension  Verbalized understanding;Returned demonstration;Verbal cues required;Tactile cues required       PT Short Term Goals - 06/04/17 0938      PT SHORT TERM GOAL #1   Title  Pt will be independent with HEP in order to improve strength and balance in order to decrease fall risk and improve function at home and work.    Time  2    Period  Weeks    Status  New    Target Date  06/18/17        PT Long Term Goals - 07/29/17 1309      PT LONG TERM GOAL #1   Title  Patient will increase FOTO score to 56 to demonstrate predicted increase in functional mobility to complete ADLs    Baseline  5/29 62    Time  8    Period  Weeks    Status  Achieved      PT LONG TERM GOAL #2   Title  Pt will decrease worst pain as reported on NPRS by at least 3 points in order to demonstrate clinically significant reduction in pain.    Baseline  07/29/17 4/10     Time  8    Period  Weeks    Status  Achieved      PT LONG TERM GOAL #3   Title  Pt will be able to open a jar to each side successfully without pain in order to complete ADLs    Baseline  5/29 unable d/t wrist pain    Time  8    Status  Not Met      PT LONG TERM GOAL #4   Title  Patient will demonstrate L grip strength of 15kg to return to PLOF in order to complete ADLs    Baseline  5/29 10kg    Time  8    Period  Weeks    Status  On-going      PT LONG TERM GOAL #5   Title  Patient will demonstrate symmetrical shoulder strength in order to safely complete overhead ADLs    Baseline  5/29:  flex L4/5 R 4+/5; ext L 5/5 R 4+/5; abd L 4/5 R 4+/5; IR L 4+/5  R 4/5; ER L 5/5 R 4/5    Time  6    Period  Weeks    Status  New      Additional Long Term Goals   Additional Long Term Goals  Yes      PT LONG TERM GOAL #6   Title  Patient will demonstrate symmetrical shoulder apleys IR/ER in order to complete ADLs  Baseline  5/29 IR: L: L1  R: T10 ER L C2 R C*    Time  6    Period  Weeks    Status  New            Plan - 08/11/17 1348    Clinical Impression Statement  PT led patient through therex progression today which patient was able to complete with no pain, only noted muscle fatigue. Patient is having minimal pain, that is mostly described as tightness in latissimus/teres minor with AAROM and AROM. PT will continue to increase functional strength and decrease soft tissue restrictions in latissimus and teres minor.     Rehab Potential  Fair    Clinical Impairments Affecting Rehab Potential  (-) transportation, multiple comorbidities (DM II, obesity, GERD, communication barrier, sedentary lifestyle (+) positive attitude,     PT Frequency  2x / week    PT Duration  8 weeks    PT Treatment/Interventions  Neuromuscular re-education;Passive range of motion;Manual techniques;Patient/family education;Taping;Therapeutic exercise;Therapeutic activities;Functional mobility training;Moist Heat;Ultrasound;Cryotherapy;Electrical Stimulation    PT Next Visit Plan  continue RTC strengthening as able    PT Home Exercise Plan  Revised 5/29: standing rows with blue tband, ER with blue tband, IR with blue band    Consulted and Agree with Plan of Care  Patient       Patient will benefit from skilled therapeutic intervention in order to improve the following deficits and impairments:  Increased fascial restricitons, Pain, Improper body mechanics, Increased muscle spasms, Impaired tone, Postural dysfunction, Decreased activity tolerance, Decreased endurance, Decreased range of motion, Decreased strength, Impaired flexibility  Visit Diagnosis: Chronic left shoulder pain  Stiffness of left shoulder, not elsewhere classified     Problem List Patient Active Problem List   Diagnosis Date Noted  . Lymphedema 01/17/2016  . Venous (peripheral) insufficiency 01/17/2016  . Pain in limb 01/17/2016  . Iron deficiency anemia due to chronic  blood loss 01/09/2016  . History of endometrial cancer 08/01/2015  . Endometrial adenocarcinoma ([place]) 07/13/2015    Class: History of  . Inguinal lymphadenopathy 07/13/2015  . Long term current use of insulin ([place]) 12/12/2013  . Microalbuminuria 12/12/2013  . Diabetes ([place]) 07/28/2013  . BP (high blood pressure) 07/28/2013  . HLD (hyperlipidemia) 07/28/2013  . Adiposity 07/28/2013  . Obstructive apnea 07/28/2013  . Arthritis, degenerative 07/28/2013  . Apnea, sleep 07/28/2013  . Thyroid nodule 07/28/2013   [name] PT, DPT [name] 08/11/2017, 1:52 PM  [facility] [address] [place], [address] Phone: [phone]   Fax:  [phone]  Name: Christine Lawson MRN: [id number] Date of Birth: [birth year]

## 2017-08-12 ENCOUNTER — Ambulatory Visit
Admission: RE | Admit: 2017-08-12 | Discharge: 2017-08-12 | Disposition: A | Discharge status: Home / Self Care | Payer: Medicare Other | Source: Ambulatory Visit | Attending: Student | Admitting: Student

## 2017-08-12 ENCOUNTER — Other Ambulatory Visit: Payer: Self-pay | Admitting: Student

## 2017-08-12 DIAGNOSIS — S63502A Unspecified sprain of left wrist, initial encounter: Secondary | ICD-10-CM

## 2017-08-12 DIAGNOSIS — M7582 Other shoulder lesions, left shoulder: Secondary | ICD-10-CM

## 2017-08-13 ENCOUNTER — Encounter: Payer: Self-pay | Admitting: Occupational Therapy

## 2017-08-14 NOTE — Therapy (Signed)
[place] [facility] [address], [place], [address] Phone: [phone]   Fax:  [phone]  Occupational Therapy Treatment  Patient Details  Name: Christine Lawson MRN: [id number] Date of Birth: 1961-02-09 Referring Provider: Arvella Nigh   Encounter Date: 08/11/2017  OT End of Session - 08/13/17 2244    Visit Number  5    Number of Visits  12    Date for OT Re-Evaluation  08/18/17    OT Start Time  7253    OT Stop Time  1432    OT Time Calculation (min)  39 min    Activity Tolerance  Patient tolerated treatment well    Behavior During Therapy  [facility] for tasks assessed/performed;Impulsive       Past Medical History:  Diagnosis Date  . Diabetes mellitus without complication ([place])   . Endometrial adenocarcinoma ([place])   . GERD (gastroesophageal reflux disease)   . Hematochezia   . Hyperlipidemia   . Hypertension   . IDA (iron deficiency anemia)   . Inguinal lymphadenopathy 07/13/2015  . Irritable bowel syndrome   . Obesity   . Osteoarthritis   . Sleep apnea     Past Surgical History:  Procedure Laterality Date  . ABDOMINAL HYSTERECTOMY  feb 2013  . COLONOSCOPY WITH PROPOFOL N/A 08/02/2015   Procedure: COLONOSCOPY WITH PROPOFOL;  Surgeon: Manya Silvas, MD;  Location: [facility] ENDOSCOPY;  Service: Endoscopy;  Laterality: N/A;  . ESOPHAGOGASTRODUODENOSCOPY (EGD) WITH PROPOFOL N/A 08/02/2015   Procedure: ESOPHAGOGASTRODUODENOSCOPY (EGD) WITH PROPOFOL;  Surgeon: Manya Silvas, MD;  Location: [facility] ENDOSCOPY;  Service: Endoscopy;  Laterality: N/A;    There were no vitals filed for this visit.  Subjective Assessment - 08/13/17 2243    Subjective   Patient reports she will be going tomorrow for MRI    Patient Stated Goals  I want to be able to use my hand like before - dressing , bathing , cutting food, lift and carry objects     Currently in Pain?  Yes    Pain Score  3     Pain Location  Wrist    Pain Orientation   Left    Pain Descriptors / Indicators  Aching    Pain Type  Acute pain    Pain Onset  More than a month ago    Pain Frequency  Constant         [facility] OT Assessment - 08/14/17 0001      Strength   Left Hand Grip (lbs)  30        Pt seen for contrast to bilateral hands/forearm, warm for 3 mins, cold for 1 min, for 3 cycles.   She is going for MRI tomorrow in [place]. Manual techniques to bilateral wrists to increase motion, decrease pain. Wrist exercises for supination/pronation Flexion/extension, ulnar and radial deviation with 3# this date for 2 sets of 10 reps, cues for form and technique.                 OT Education - 08/13/17 2244    Education provided  Yes    Education Details  HEP for 2 lbs , progress and POC     Person(s) Educated  Patient    Methods  Explanation;Demonstration    Comprehension  Verbalized understanding;Returned demonstration       OT Short Term Goals - 07/07/17 1824      OT SHORT TERM GOAL #1   Title  Pain on L  wrist decrease to less than 5/10 at rest to wean out of splint     Baseline  splint more than 50% during day ,  night time - and pain between 7-10/10 pe rpt     Time  3    Period  Weeks    Status  New    Target Date  07/28/17      OT SHORT TERM GOAL #2   Title  Pt to be ind in HEP to decrease pain , increase AROM in wrist in all planes and increase grip     Baseline  no knowledge    Time  2    Period  Weeks    Status  New    Target Date  07/21/17        OT Long Term Goals - 07/07/17 1826      OT LONG TERM GOAL #1   Title  Pt wrist AROM and strength increase for pt to use L hand in more than 50% of bathing , cut food, turn doorknob, without increase symptoms     Baseline  see flowsheet     Time  5    Period  Weeks    Status  New    Target Date  08/11/17      OT LONG TERM GOAL #2   Title  L grip  increase with more than 5 lbs and prehension strength increase with more than 3 lbs to use hand in cooking , laudry  and cut food     Baseline  L grip 18 , R 35, Lat/ 3 point grip L 7 lbs and R 12     Time  6    Period  Weeks    Status  New    Target Date  08/18/17            Plan - 08/13/17 2245    Clinical Impression Statement  Patient continues to progress, she is scheduled for an MRI tomorrow.  Increased weight for wrist exercises to 3# this date, she has been doing 2# at home.  She is able to carry up to 7#.  Patient has progressed with participation to  50% of bathing and requiring some assistance still with socks and shoes.  Decreased pain noted on the left to 3 today.  Continue to work towards goals to decrease pain, increase ROM and strength as well as improve independence in daily self care tasks/IADLs.     Occupational performance deficits (Please refer to evaluation for details):  ADL's;IADL's;Play;Leisure    Rehab Potential  Fair    Current Impairments/barriers affecting progress:  co-morbitities     OT Frequency  1x / week    OT Duration  4 weeks    OT Treatment/Interventions  Self-care/ADL training;Iontophoresis;Therapeutic exercise;Ultrasound;Contrast Bath;Fluidtherapy;Manual Therapy;Splinting;Patient/family education    Consulted and Agree with Plan of Care  Patient       Patient will benefit from skilled therapeutic intervention in order to improve the following deficits and impairments:  Pain, Impaired flexibility, Increased edema, Decreased strength, Decreased range of motion, Impaired UE functional use  Visit Diagnosis: Pain in left wrist  Stiffness of left wrist, not elsewhere classified  Muscle weakness (generalized)  Pain in left hand    Problem List Patient Active Problem List   Diagnosis Date Noted  . Lymphedema 01/17/2016  . Venous (peripheral) insufficiency 01/17/2016  . Pain in limb 01/17/2016  . Iron deficiency anemia due to chronic blood loss 01/09/2016  . History of  endometrial cancer 08/01/2015  . Endometrial adenocarcinoma ([place]) 07/13/2015    Class:  History of  . Inguinal lymphadenopathy 07/13/2015  . Long term current use of insulin ([place]) 12/12/2013  . Microalbuminuria 12/12/2013  . Diabetes ([place]) 07/28/2013  . BP (high blood pressure) 07/28/2013  . HLD (hyperlipidemia) 07/28/2013  . Adiposity 07/28/2013  . Obstructive apnea 07/28/2013  . Arthritis, degenerative 07/28/2013  . Apnea, sleep 07/28/2013  . Thyroid nodule 07/28/2013   Deangela Randleman T Cate Oravec, OTR/L, CLT  Melyssa Signor 08/14/2017, 10:27 AM  [facility] [address], [place], [address] Phone: [phone]   Fax:  [phone]  Name: Christine Lawson MRN: [id number] Date of Birth: [birth year]

## 2017-08-17 ENCOUNTER — Ambulatory Visit: Payer: Medicare Other | Admitting: Occupational Therapy

## 2017-08-17 ENCOUNTER — Ambulatory Visit: Payer: Medicare Other | Admitting: Physical Therapy

## 2017-08-17 ENCOUNTER — Encounter: Payer: Self-pay | Admitting: Physical Therapy

## 2017-08-17 DIAGNOSIS — M25532 Pain in left wrist: Secondary | ICD-10-CM

## 2017-08-17 DIAGNOSIS — M6281 Muscle weakness (generalized): Secondary | ICD-10-CM

## 2017-08-17 DIAGNOSIS — G8929 Other chronic pain: Secondary | ICD-10-CM

## 2017-08-17 DIAGNOSIS — M25512 Pain in left shoulder: Principal | ICD-10-CM

## 2017-08-17 DIAGNOSIS — M79642 Pain in left hand: Secondary | ICD-10-CM

## 2017-08-17 DIAGNOSIS — M25632 Stiffness of left wrist, not elsewhere classified: Secondary | ICD-10-CM

## 2017-08-17 NOTE — Therapy (Signed)
[facility] [address], [place], [address] Phone: [phone]   Fax:  [phone]  Physical Therapy Treatment  Patient Details  Name: Christine Lawson MRN: [id number] Date of Birth: 1960/07/29 Referring Provider: Arvella Nigh   Encounter Date: 08/17/2017  PT End of Session - 08/17/17 1522    Visit Number  17    Number of Visits  29    Date for PT Re-Evaluation  09/09/17    PT Start Time  0315    PT Stop Time  0400    PT Time Calculation (min)  45 min    Activity Tolerance  Patient tolerated treatment well;Patient limited by pain    Behavior During Therapy  [facility] for tasks assessed/performed;Impulsive       Past Medical History:  Diagnosis Date  . Diabetes mellitus without complication ([place])   . Endometrial adenocarcinoma ([place])   . GERD (gastroesophageal reflux disease)   . Hematochezia   . Hyperlipidemia   . Hypertension   . IDA (iron deficiency anemia)   . Inguinal lymphadenopathy 07/13/2015  . Irritable bowel syndrome   . Obesity   . Osteoarthritis   . Sleep apnea     Past Surgical History:  Procedure Laterality Date  . ABDOMINAL HYSTERECTOMY  feb 2013  . COLONOSCOPY WITH PROPOFOL N/A 08/02/2015   Procedure: COLONOSCOPY WITH PROPOFOL;  Surgeon: Manya Silvas, MD;  Location: [facility] ENDOSCOPY;  Service: Endoscopy;  Laterality: N/A;  . ESOPHAGOGASTRODUODENOSCOPY (EGD) WITH PROPOFOL N/A 08/02/2015   Procedure: ESOPHAGOGASTRODUODENOSCOPY (EGD) WITH PROPOFOL;  Surgeon: Manya Silvas, MD;  Location[facility] ENDOSCOPY;  Service: Endoscopy;  Laterality: N/A;    There were no vitals filed for this visit.    Ther-Ex -Pulleys 10x flex 10x abd with full AAROM with stretch at end range with patient reporting she "understands why PT does manual at latissimus" -Standing shoulder abd 3x 10 3# with cuing for eccentric control -Standing shoulder flex 3x 10 3# -Seated military press 3x 10 3# with TC and VC needed  initially for proper form with proper ROM -Seated row 1x 10 10# 2x 10 15# with min cuing for proper posture -Seated mini tricep dips 1x 6/7/7 with demo and max VC    Manual -STM with trigger point release to R latissimus and R bicep muscle belly (prolonged time spent at R latissimus) -Multiple bouts of PROM into abd/IR/ER/flex with focus on abd and IR as they are most limited                         PT Education - 08/17/17 1522    Education provided  Yes    Education Details  exercise form    Person(s) Educated  Patient    Methods  Explanation;Verbal cues    Comprehension  Verbalized understanding;Verbal cues required       PT Short Term Goals - 06/04/17 0938      PT SHORT TERM GOAL #1   Title  Pt will be independent with HEP in order to improve strength and balance in order to decrease fall risk and improve function at home and work.    Time  2    Period  Weeks    Status  New    Target Date  06/18/17        PT Long Term Goals - 07/29/17 1309      PT LONG TERM GOAL #1   Title  Patient will increase  FOTO score to 56 to demonstrate predicted increase in functional mobility to complete ADLs    Baseline  5/29 62    Time  8    Period  Weeks    Status  Achieved      PT LONG TERM GOAL #2   Title  Pt will decrease worst pain as reported on NPRS by at least 3 points in order to demonstrate clinically significant reduction in pain.    Baseline  07/29/17 4/10     Time  8    Period  Weeks    Status  Achieved      PT LONG TERM GOAL #3   Title  Pt will be able to open a jar to each side successfully without pain in order to complete ADLs    Baseline  5/29 unable d/t wrist pain    Time  8    Status  Not Met      PT LONG TERM GOAL #4   Title  Patient will demonstrate L grip strength of 15kg to return to PLOF in order to complete ADLs    Baseline  5/29 10kg    Time  8    Period  Weeks    Status  On-going      PT LONG TERM GOAL #5   Title  Patient  will demonstrate symmetrical shoulder strength in order to safely complete overhead ADLs    Baseline  5/29:  flex L4/5 R 4+/5; ext L 5/5 R 4+/5; abd L 4/5 R 4+/5; IR L 4+/5  R 4/5; ER L 5/5 R 4/5    Time  6    Period  Weeks    Status  New      Additional Long Term Goals   Additional Long Term Goals  Yes      PT LONG TERM GOAL #6   Title  Patient will demonstrate symmetrical shoulder apleys IR/ER in order to complete ADLs    Baseline  5/29 IR: L: L1 R: T10 ER L C2 R C*    Time  6    Period  Weeks    Status  New            Plan - 08/17/17 1557    Clinical Impression Statement  Patient is progressing with ROM and strengthening, with min cuing for proper cform with therex. Patient admitted that she had not been very deligent with HEP stretching and PT encouraged patient to be compliant with stretching at home to maintain gains made through manual techniques, stressing the importance of consistancy for soft tissue lengthening.     Rehab Potential  Fair    Clinical Impairments Affecting Rehab Potential  (-) transportation, multiple comorbidities (DM II, obesity, GERD, communication barrier, sedentary lifestyle (+) positive attitude,     PT Frequency  2x / week    PT Duration  8 weeks    PT Treatment/Interventions  Neuromuscular re-education;Passive range of motion;Manual techniques;Patient/family education;Taping;Therapeutic exercise;Therapeutic activities;Functional mobility training;Moist Heat;Ultrasound;Cryotherapy;Electrical Stimulation    PT Next Visit Plan  continue RTC strengthening as able    PT Home Exercise Plan  Revised 5/29: standing rows with blue tband, ER with blue tband, IR with blue band    Consulted and Agree with Plan of Care  Patient       Patient will benefit from skilled therapeutic intervention in order to improve the following deficits and impairments:  Increased fascial restricitons, Pain, Improper body mechanics, Increased muscle spasms, Impaired tone, Postural  dysfunction,  Decreased activity tolerance, Decreased endurance, Decreased range of motion, Decreased strength, Impaired flexibility  Visit Diagnosis: Chronic left shoulder pain  Pain in left wrist     Problem List Patient Active Problem List   Diagnosis Date Noted  . Lymphedema 01/17/2016  . Venous (peripheral) insufficiency 01/17/2016  . Pain in limb 01/17/2016  . Iron deficiency anemia due to chronic blood loss 01/09/2016  . History of endometrial cancer 08/01/2015  . Endometrial adenocarcinoma ([place]) 07/13/2015    Class: History of  . Inguinal lymphadenopathy 07/13/2015  . Long term current use of insulin ([place]) 12/12/2013  . Microalbuminuria 12/12/2013  . Diabetes ([place]) 07/28/2013  . BP (high blood pressure) 07/28/2013  . HLD (hyperlipidemia) 07/28/2013  . Adiposity 07/28/2013  . Obstructive apnea 07/28/2013  . Arthritis, degenerative 07/28/2013  . Apnea, sleep 07/28/2013  . Thyroid nodule 07/28/2013   [name] PT, DPT [name] 08/17/2017, 4:01 PM  [place] [facility] [address] [place], [address] Phone: [phone]   Fax:  [phone]  Name: Christine Lawson MRN: [id number] Date of Birth: 1960/12/10

## 2017-08-19 ENCOUNTER — Other Ambulatory Visit: Payer: Self-pay | Admitting: Obstetrics and Gynecology

## 2017-08-19 ENCOUNTER — Ambulatory Visit: Payer: Medicare Other | Admitting: Physical Therapy

## 2017-08-19 ENCOUNTER — Encounter: Payer: Self-pay | Admitting: Physical Therapy

## 2017-08-19 DIAGNOSIS — M25512 Pain in left shoulder: Principal | ICD-10-CM

## 2017-08-19 DIAGNOSIS — G8929 Other chronic pain: Secondary | ICD-10-CM

## 2017-08-19 DIAGNOSIS — Z1231 Encounter for screening mammogram for malignant neoplasm of breast: Secondary | ICD-10-CM

## 2017-08-19 DIAGNOSIS — M25532 Pain in left wrist: Secondary | ICD-10-CM | POA: Diagnosis not present

## 2017-08-19 NOTE — Therapy (Signed)
[place] [facility] [address], [place], [address] Phone: [phone]   Fax:  [phone]  Physical Therapy Treatment  Patient Details  Name: Christine Lawson MRN: [id number] Date of Birth: 1960-11-05 Referring Provider: Arvella Nigh   Encounter Date: 08/19/2017  PT End of Session - 08/19/17 1441    Visit Number  18    Number of Visits  29    Date for PT Re-Evaluation  09/09/17    PT Start Time  0230    PT Stop Time  0315    PT Time Calculation (min)  45 min    Activity Tolerance  Patient tolerated treatment well;Patient limited by pain    Behavior During Therapy  [facility] for tasks assessed/performed;Impulsive       Past Medical History:  Diagnosis Date  . Diabetes mellitus without complication ([place])   . Endometrial adenocarcinoma ([place])   . GERD (gastroesophageal reflux disease)   . Hematochezia   . Hyperlipidemia   . Hypertension   . IDA (iron deficiency anemia)   . Inguinal lymphadenopathy 07/13/2015  . Irritable bowel syndrome   . Obesity   . Osteoarthritis   . Sleep apnea     Past Surgical History:  Procedure Laterality Date  . ABDOMINAL HYSTERECTOMY  feb 2013  . COLONOSCOPY WITH PROPOFOL N/A 08/02/2015   Procedure: COLONOSCOPY WITH PROPOFOL;  Surgeon: Manya Silvas, MD;  Location: [facility] ENDOSCOPY;  Service: Endoscopy;  Laterality: N/A;  . ESOPHAGOGASTRODUODENOSCOPY (EGD) WITH PROPOFOL N/A 08/02/2015   Procedure: ESOPHAGOGASTRODUODENOSCOPY (EGD) WITH PROPOFOL;  Surgeon: Manya Silvas, MD;  Location: [facility] ENDOSCOPY;  Service: Endoscopy;  Laterality: N/A;    There were no vitals filed for this visit.  Subjective Assessment - 08/19/17 1436    Subjective  Patient reports 3/10 pain in L shoulder and wrist. Patient reports she has been "moving the shoulder more" and reports compliance with her HEP.     Pertinent History  Patient is 57 year old female that reports a fall resulting in a chair falling onto L  shoulder and wrist at the beginning of March. Patient reports that MD instructed her to wear shoulder sling as needed in community and follow up appointment Dr. Theresa Mulligan (3/27) instructed patient to wear L wrist brace for wrist sprain. Patient is scheduled for MRI 4/8 for shoulder and wrist. Patient reports worst pain in L shoulder 10/10 in past week, worst pain in L wrist 10/10. Patient reports the best her pain has been in the R shoulder in the past week: 8/10 wrist:  9/10    Limitations  House hold activities;Lifting;Writing;Sitting;Standing;Walking    How long can you sit comfortably?  Pain in all positions    How long can you stand comfortably?  Pain in all positions    How long can you walk comfortably?  Pain in all positions    Diagnostic tests  MRI scheduled 4/8    Patient Stated Goals  decrease pain    Pain Onset  1 to 4 weeks ago         Ther-Ex -Pulleys 4 min for warmup during hx intake  -OMEGA seated row 1x 10 15# 2x 10 20# with min cuing for full scapular retraction -OMEGA lat pull down 1x 10 20# 2x 10 15# with demo and max TC/VC needed initially for proper form, with good carry over between sets -Small body blade mild shoulder flex/ext with 90d 20x and 20x medial/lateral movement with max cuing for scapular stabilization -overhead  ball throws to rebounder with bilat UE catching 2KG ball with max cuing initially for proper form 3x 10    Manual -STM withtrigger point releaseto R latissimus and R bicep muscle belly (prolonged time spent at R latissimus) -Multiple bouts of PROM into abd/IR/ER/flex with focus on abd and IR as they are most limited                  PT Education - 08/19/17 1440    Education provided  Yes    Education Details  exercise form    Person(s) Educated  Patient    Methods  Explanation;Tactile cues;Verbal cues    Comprehension  Verbal cues required;Returned demonstration;Verbalized understanding       PT Short Term Goals - 06/04/17 2458       PT SHORT TERM GOAL #1   Title  Pt will be independent with HEP in order to improve strength and balance in order to decrease fall risk and improve function at home and work.    Time  2    Period  Weeks    Status  New    Target Date  06/18/17        PT Long Term Goals - 07/29/17 1309      PT LONG TERM GOAL #1   Title  Patient will increase FOTO score to 56 to demonstrate predicted increase in functional mobility to complete ADLs    Baseline  5/29 62    Time  8    Period  Weeks    Status  Achieved      PT LONG TERM GOAL #2   Title  Pt will decrease worst pain as reported on NPRS by at least 3 points in order to demonstrate clinically significant reduction in pain.    Baseline  07/29/17 4/10     Time  8    Period  Weeks    Status  Achieved      PT LONG TERM GOAL #3   Title  Pt will be able to open a jar to each side successfully without pain in order to complete ADLs    Baseline  5/29 unable d/t wrist pain    Time  8    Status  Not Met      PT LONG TERM GOAL #4   Title  Patient will demonstrate L grip strength of 15kg to return to PLOF in order to complete ADLs    Baseline  5/29 10kg    Time  8    Period  Weeks    Status  On-going      PT LONG TERM GOAL #5   Title  Patient will demonstrate symmetrical shoulder strength in order to safely complete overhead ADLs    Baseline  5/29:  flex L4/5 R 4+/5; ext L 5/5 R 4+/5; abd L 4/5 R 4+/5; IR L 4+/5  R 4/5; ER L 5/5 R 4/5    Time  6    Period  Weeks    Status  New      Additional Long Term Goals   Additional Long Term Goals  Yes      PT LONG TERM GOAL #6   Title  Patient will demonstrate symmetrical shoulder apleys IR/ER in order to complete ADLs    Baseline  5/29 IR: L: L1 R: T10 ER L C2 R C*    Time  6    Period  Weeks    Status  New  Plan - 08/19/17 1510    Clinical Impression Statement  PT is continuing to progress therex, which patient is tolerating without increased pain. Patient is requiring  cuing for proper form for all exercises but is able to complete therex with proper form following PT cuing. Patient is continuing to exhibit tension in L latissimus and bicep, with resolution 50% following manual techniques. PT encouraged patient to continue HEP for maintainance.     Rehab Potential  Fair    Clinical Impairments Affecting Rehab Potential  (-) transportation, multiple comorbidities (DM II, obesity, GERD, communication barrier, sedentary lifestyle (+) positive attitude,     PT Frequency  2x / week    PT Duration  8 weeks    PT Treatment/Interventions  Neuromuscular re-education;Passive range of motion;Manual techniques;Patient/family education;Taping;Therapeutic exercise;Therapeutic activities;Functional mobility training;Moist Heat;Ultrasound;Cryotherapy;Electrical Stimulation    PT Next Visit Plan  continue RTC strengthening as able    PT Home Exercise Plan  Revised 5/29: standing rows with blue tband, ER with blue tband, IR with blue band    Consulted and Agree with Plan of Care  Patient       Patient will benefit from skilled therapeutic intervention in order to improve the following deficits and impairments:  Increased fascial restricitons, Pain, Improper body mechanics, Increased muscle spasms, Impaired tone, Postural dysfunction, Decreased activity tolerance, Decreased endurance, Decreased range of motion, Decreased strength, Impaired flexibility  Visit Diagnosis: Chronic left shoulder pain  Pain in left wrist     Problem List Patient Active Problem List   Diagnosis Date Noted  . Lymphedema 01/17/2016  . Venous (peripheral) insufficiency 01/17/2016  . Pain in limb 01/17/2016  . Iron deficiency anemia due to chronic blood loss 01/09/2016  . History of endometrial cancer 08/01/2015  . Endometrial adenocarcinoma ([place]) 07/13/2015    Class: History of  . Inguinal lymphadenopathy 07/13/2015  . Long term current use of insulin ([place]) 12/12/2013  . Microalbuminuria  12/12/2013  . Diabetes ([place]) 07/28/2013  . BP (high blood pressure) 07/28/2013  . HLD (hyperlipidemia) 07/28/2013  . Adiposity 07/28/2013  . Obstructive apnea 07/28/2013  . Arthritis, degenerative 07/28/2013  . Apnea, sleep 07/28/2013  . Thyroid nodule 07/28/2013   [name] PT, DPT [name] 08/19/2017, 3:15 PM  [place] [address] [place], [address] Phone: [phone]   Fax:  [phone]  Name: Christine Lawson MRN: [id number] Date of Birth: 05-18-1960

## 2017-08-20 ENCOUNTER — Ambulatory Visit
Admission: RE | Admit: 2017-08-20 | Discharge: 2017-08-20 | Disposition: A | Discharge status: Home / Self Care | Payer: Medicare Other | Source: Ambulatory Visit | Attending: Student | Admitting: Student

## 2017-08-20 DIAGNOSIS — S63502A Unspecified sprain of left wrist, initial encounter: Secondary | ICD-10-CM | POA: Insufficient documentation

## 2017-08-20 DIAGNOSIS — M7582 Other shoulder lesions, left shoulder: Secondary | ICD-10-CM | POA: Diagnosis not present

## 2017-08-21 ENCOUNTER — Encounter: Payer: Self-pay | Admitting: Occupational Therapy

## 2017-08-21 NOTE — Therapy (Signed)
[place] [facility] [address] [place], [address] Phone: [phone]   Fax:  [phone]  Occupational Therapy Treatment  Patient Details  Name: Genice Kimberlin MRN: [id number] Date of Birth: 11/22/1960 Referring Provider: Arvella Nigh   Encounter Date: 08/17/2017  OT End of Session - 08/21/17 2250    Visit Number  6    Number of Visits  12    Date for OT Re-Evaluation  08/18/17    OT Start Time  1430    OT Stop Time  1515    OT Time Calculation (min)  45 min    Activity Tolerance  Patient tolerated treatment well    Behavior During Therapy  [facility] for tasks assessed/performed;Impulsive       Past Medical History:  Diagnosis Date  . Diabetes mellitus without complication ([place])   . Endometrial adenocarcinoma ([place])   . GERD (gastroesophageal reflux disease)   . Hematochezia   . Hyperlipidemia   . Hypertension   . IDA (iron deficiency anemia)   . Inguinal lymphadenopathy 07/13/2015  . Irritable bowel syndrome   . Obesity   . Osteoarthritis   . Sleep apnea     Past Surgical History:  Procedure Laterality Date  . ABDOMINAL HYSTERECTOMY  feb 2013  . COLONOSCOPY WITH PROPOFOL N/A 08/02/2015   Procedure: COLONOSCOPY WITH PROPOFOL;  Surgeon: Manya Silvas, MD;  Location: [facility] ENDOSCOPY;  Service: Endoscopy;  Laterality: N/A;  . ESOPHAGOGASTRODUODENOSCOPY (EGD) WITH PROPOFOL N/A 08/02/2015   Procedure: ESOPHAGOGASTRODUODENOSCOPY (EGD) WITH PROPOFOL;  Surgeon: Manya Silvas, MD;  Location: [facility] ENDOSCOPY;  Service: Endoscopy;  Laterality: N/A;    There were no vitals filed for this visit.  Subjective Assessment - 08/21/17 2247    Subjective   Patient reports she went for MRI and they could not get a picture so they stopped the test and now she may have a CT scan    Patient Stated Goals  I want to be able to use my hand like before - dressing , bathing , cutting food, lift and carry objects     Currently in Pain?   Yes    Pain Score  3     Pain Location  Wrist    Pain Orientation  Left    Pain Descriptors / Indicators  Aching    Pain Type  Acute pain    Pain Onset  More than a month ago    Pain Frequency  Constant          Pt seen for contrast to bilateral hands/forearm, warm for 3 mins, cold for 1 min, for 3 cycles.   Patient able to demonstrate understanding of contrast at home to continue to decrease edema and pain.   Manual techniques to bilateral wrists to increase motion, decrease pain. Wrist exercises for supination/pronation 3#, 2 sets of 10 reps Flexion/extension, ulnar and radial deviation with 3# this date for 2 sets of 10 reps, cues for form and technique.                    OT Education - 08/21/17 2249    Education provided  Yes    Education Details  HEP with 3#, plan of care, patient to follow up with therapist on July 11    Person(s) Educated  Patient    Methods  Explanation;Demonstration    Comprehension  Verbalized understanding;Returned demonstration       OT Short Term Goals - 08/21/17 2259  OT SHORT TERM GOAL #1   Title  Pain on L wrist decrease to less than 5/10 at rest to wean out of splint     Baseline  splint more than 50% during day ,  night time - and pain between 7-10/10 pe rpt     Time  3    Period  Weeks    Status  Achieved      OT SHORT TERM GOAL #2   Title  Pt to be ind in HEP to decrease pain , increase AROM in wrist in all planes and increase grip     Baseline  no knowledge    Time  2    Period  Weeks    Status  On-going        OT Long Term Goals - 07/07/17 1826      OT LONG TERM GOAL #1   Title  Pt wrist AROM and strength increase for pt to use L hand in more than 50% of bathing , cut food, turn doorknob, without increase symptoms     Baseline  see flowsheet     Time  5    Period  Weeks    Status  New    Target Date  08/11/17      OT LONG TERM GOAL #2   Title  L grip  increase with more than 5 lbs and prehension  strength increase with more than 3 lbs to use hand in cooking , laudry and cut food     Baseline  L grip 18 , R 35, Lat/ 3 point grip L 7 lbs and R 12     Time  6    Period  Weeks    Status  New    Target Date  08/18/17            Plan - 08/21/17 2251    Clinical Impression Statement  Patient was unable to have a MRI done, she is now scheduled for a CT scan.  Patient performing wrist exercises with 3# in the clinic and needs to get a 3# weight for home and is able to demonstrate HEP.  She reports decreased pain to 3/10.  She will continue with her home program and will follow up with therapist in a couple weeks on July 11.      Occupational performance deficits (Please refer to evaluation for details):  ADL's;IADL's;Play;Leisure    Rehab Potential  Fair    Current Impairments/barriers affecting progress:  co-morbitities     OT Frequency  1x / week    OT Duration  4 weeks    OT Treatment/Interventions  Self-care/ADL training;Iontophoresis;Therapeutic exercise;Ultrasound;Contrast Bath;Fluidtherapy;Manual Therapy;Splinting;Patient/family education    Consulted and Agree with Plan of Care  Patient       Patient will benefit from skilled therapeutic intervention in order to improve the following deficits and impairments:  Pain, Impaired flexibility, Increased edema, Decreased strength, Decreased range of motion, Impaired UE functional use  Visit Diagnosis: Pain in left wrist  Stiffness of left wrist, not elsewhere classified  Muscle weakness (generalized)  Pain in left hand    Problem List Patient Active Problem List   Diagnosis Date Noted  . Lymphedema 01/17/2016  . Venous (peripheral) insufficiency 01/17/2016  . Pain in limb 01/17/2016  . Iron deficiency anemia due to chronic blood loss 01/09/2016  . History of endometrial cancer 08/01/2015  . Endometrial adenocarcinoma ([place]) 07/13/2015    Class: History of  . Inguinal lymphadenopathy 07/13/2015  .  Long term current use  of insulin ([place]) 12/12/2013  . Microalbuminuria 12/12/2013  . Diabetes ([place]) 07/28/2013  . BP (high blood pressure) 07/28/2013  . HLD (hyperlipidemia) 07/28/2013  . Adiposity 07/28/2013  . Obstructive apnea 07/28/2013  . Arthritis, degenerative 07/28/2013  . Apnea, sleep 07/28/2013  . Thyroid nodule 07/28/2013   Amy T Lovett, OTR/L, CLT  Lovett,Amy 08/21/2017, 11:00 PM  [place] [facility] [address], [place], [address] Phone: [phone]   Fax:  [phone]  Name: Delorus Langwell MRN: [id number] Date of Birth: 14-Feb-1961

## 2017-08-25 ENCOUNTER — Encounter: Payer: Self-pay | Admitting: Physical Therapy

## 2017-08-25 ENCOUNTER — Ambulatory Visit: Payer: Medicare Other | Admitting: Physical Therapy

## 2017-08-25 DIAGNOSIS — M25512 Pain in left shoulder: Principal | ICD-10-CM

## 2017-08-25 DIAGNOSIS — M25532 Pain in left wrist: Secondary | ICD-10-CM | POA: Diagnosis not present

## 2017-08-25 DIAGNOSIS — G8929 Other chronic pain: Secondary | ICD-10-CM

## 2017-08-25 NOTE — Therapy (Signed)
[place] [facility] [address] [place], [address] Phone: [phone]   Fax:  [phone]  Physical Therapy Treatment  Patient Details  Name: Christine Lawson MRN: [id number] Date of Birth: 17-Nov-1960 Referring Provider: Arvella Nigh   Encounter Date: 08/25/2017  PT End of Session - 08/25/17 1305    Visit Number  19    Number of Visits  29    Date for PT Re-Evaluation  09/09/17    PT Start Time  0100    PT Stop Time  0145    PT Time Calculation (min)  45 min    Activity Tolerance  Patient tolerated treatment well;Patient limited by pain    Behavior During Therapy  [facility] for tasks assessed/performed;Impulsive       Past Medical History:  Diagnosis Date  . Diabetes mellitus without complication ([place])   . Endometrial adenocarcinoma ([place])   . GERD (gastroesophageal reflux disease)   . Hematochezia   . Hyperlipidemia   . Hypertension   . IDA (iron deficiency anemia)   . Inguinal lymphadenopathy 07/13/2015  . Irritable bowel syndrome   . Obesity   . Osteoarthritis   . Sleep apnea     Past Surgical History:  Procedure Laterality Date  . ABDOMINAL HYSTERECTOMY  feb 2013  . COLONOSCOPY WITH PROPOFOL N/A 08/02/2015   Procedure: COLONOSCOPY WITH PROPOFOL;  Surgeon: Manya Silvas, MD;  Location: [facility] ENDOSCOPY;  Service: Endoscopy;  Laterality: N/A;  . ESOPHAGOGASTRODUODENOSCOPY (EGD) WITH PROPOFOL N/A 08/02/2015   Procedure: ESOPHAGOGASTRODUODENOSCOPY (EGD) WITH PROPOFOL;  Surgeon: Manya Silvas, MD;  Location: [facility] ENDOSCOPY;  Service: Endoscopy;  Laterality: N/A;    There were no vitals filed for this visit.  Subjective Assessment - 08/25/17 1309    Subjective  Patient reports 3/10 pain in L shoulder today, reporting she thinks it is moving better. Patient comes in today with order for PT for shoulder and wrist. Patient reports "she needs more physical therapy".     Limitations  House hold  activities;Lifting;Writing;Sitting;Standing;Walking    How long can you sit comfortably?  Pain in all positions    How long can you stand comfortably?  Pain in all positions    How long can you walk comfortably?  Pain in all positions    Diagnostic tests  MRI scheduled 4/8    Patient Stated Goals  decrease pain    Pain Onset  More than a month ago    Pain Onset  1 to 4 weeks ago             Ther-Ex -Pulleys 10x flexion 10x abd for warm up during hx intake -Overhead ball throws to rebounder with bilat UE catching 2KG ball with max cuing initially for proper form 3x 10 -Bilat shoulder flexion w/ 10# with focus on scapular motor control with TC and VC for proper humeral scapular rhythm -Low rows 3x 10 with green tband with TC and VC needed initially to sustain elbow ext for tricep activation and for eccentric control -Perturbations from PT for scapular stability activation; completed at 90d abd and 90d of flexion    Manual -GHJ AP mobs grade III 30sec bouts 8 bouts  STM withtrigger point releaseto R latissimusand R bicep muscle belly (prolonged time spent at R latissimus) -Multiple bouts of PROM into abd/IR/ER/flex with focus on abd and IR as they are most limited                 PT Education -  08/25/17 1316    Education provided  Yes    Education Details  exercise form    Person(s) Educated  Patient    Methods  Explanation;Demonstration;Tactile cues;Verbal cues    Comprehension  Verbal cues required;Returned demonstration;Verbalized understanding;Tactile cues required       PT Short Term Goals - 06/04/17 0938      PT SHORT TERM GOAL #1   Title  Pt will be independent with HEP in order to improve strength and balance in order to decrease fall risk and improve function at home and work.    Time  2    Period  Weeks    Status  New    Target Date  06/18/17        PT Long Term Goals - 07/29/17 1309      PT LONG TERM GOAL #1   Title  Patient will increase  FOTO score to 56 to demonstrate predicted increase in functional mobility to complete ADLs    Baseline  5/29 62    Time  8    Period  Weeks    Status  Achieved      PT LONG TERM GOAL #2   Title  Pt will decrease worst pain as reported on NPRS by at least 3 points in order to demonstrate clinically significant reduction in pain.    Baseline  07/29/17 4/10     Time  8    Period  Weeks    Status  Achieved      PT LONG TERM GOAL #3   Title  Pt will be able to open a jar to each side successfully without pain in order to complete ADLs    Baseline  5/29 unable d/t wrist pain    Time  8    Status  Not Met      PT LONG TERM GOAL #4   Title  Patient will demonstrate L grip strength of 15kg to return to PLOF in order to complete ADLs    Baseline  5/29 10kg    Time  8    Period  Weeks    Status  On-going      PT LONG TERM GOAL #5   Title  Patient will demonstrate symmetrical shoulder strength in order to safely complete overhead ADLs    Baseline  5/29:  flex L4/5 R 4+/5; ext L 5/5 R 4+/5; abd L 4/5 R 4+/5; IR L 4+/5  R 4/5; ER L 5/5 R 4/5    Time  6    Period  Weeks    Status  New      Additional Long Term Goals   Additional Long Term Goals  Yes      PT LONG TERM GOAL #6   Title  Patient will demonstrate symmetrical shoulder apleys IR/ER in order to complete ADLs    Baseline  5/29 IR: L: L1 R: T10 ER L C2 R C*    Time  6    Period  Weeks    Status  New            Plan - 08/25/17 1345    Clinical Impression Statement  PT continued to progress therex with focus on scapular stability and depression, as patient is unable to acheive true shoulder flexion d/t inadequate scapular movement/depression. Patient tolerated progression well, with no increased pain, requiring cuing for most exercises to complete with proper form. PT will continue to address motor control and RTC ROM/strengthening  Clinical Impairments Affecting Rehab Potential  (-) transportation, multiple comorbidities  (DM II, obesity, GERD, communication barrier, sedentary lifestyle (+) positive attitude,     PT Frequency  2x / week    PT Duration  8 weeks    PT Treatment/Interventions  Neuromuscular re-education;Passive range of motion;Manual techniques;Patient/family education;Taping;Therapeutic exercise;Therapeutic activities;Functional mobility training;Moist Heat;Ultrasound;Cryotherapy;Electrical Stimulation    PT Next Visit Plan  continue RTC strengthening as able with scapular stability focus    PT Home Exercise Plan  Revised 5/29: standing rows with blue tband, ER with blue tband, IR with blue band    Consulted and Agree with Plan of Care  Patient       Patient will benefit from skilled therapeutic intervention in order to improve the following deficits and impairments:  Increased fascial restricitons, Pain, Improper body mechanics, Increased muscle spasms, Impaired tone, Postural dysfunction, Decreased activity tolerance, Decreased endurance, Decreased range of motion, Decreased strength, Impaired flexibility  Visit Diagnosis: Chronic left shoulder pain     Problem List Patient Active Problem List   Diagnosis Date Noted  . Lymphedema 01/17/2016  . Venous (peripheral) insufficiency 01/17/2016  . Pain in limb 01/17/2016  . Iron deficiency anemia due to chronic blood loss 01/09/2016  . History of endometrial cancer 08/01/2015  . Endometrial adenocarcinoma ([place]) 07/13/2015    Class: History of  . Inguinal lymphadenopathy 07/13/2015  . Long term current use of insulin ([place]) 12/12/2013  . Microalbuminuria 12/12/2013  . Diabetes ([place]) 07/28/2013  . BP (high blood pressure) 07/28/2013  . HLD (hyperlipidemia) 07/28/2013  . Adiposity 07/28/2013  . Obstructive apnea 07/28/2013  . Arthritis, degenerative 07/28/2013  . Apnea, sleep 07/28/2013  . Thyroid nodule 07/28/2013   [name] PT, DPT [name] 08/25/2017, 1:51 PM  [place] [facility] [address], [place], [address] Phone: [phone]   Fax:  [phone]  Name: Christine Lawson MRN: [id number] Date of Birth: 13-Jan-1961

## 2017-08-27 ENCOUNTER — Ambulatory Visit: Payer: Medicare Other | Admitting: Physical Therapy

## 2017-08-27 ENCOUNTER — Encounter: Payer: Self-pay | Admitting: Physical Therapy

## 2017-08-27 DIAGNOSIS — M25512 Pain in left shoulder: Principal | ICD-10-CM

## 2017-08-27 DIAGNOSIS — G8929 Other chronic pain: Secondary | ICD-10-CM

## 2017-08-27 DIAGNOSIS — M25532 Pain in left wrist: Secondary | ICD-10-CM | POA: Diagnosis not present

## 2017-08-27 NOTE — Therapy (Signed)
[place] [facility] [address] [place], [address] Phone: [phone]   Fax:  [phone]  Physical Therapy Treatment  Patient Details  Name: Christine Lawson MRN: [id number] Date of Birth: 06-26-1960 Referring Provider: Arvella Nigh   Encounter Date: 08/27/2017  PT End of Session - 08/27/17 1656    Visit Number  20    Number of Visits  29    Date for PT Re-Evaluation  09/09/17    PT Start Time  0445    PT Stop Time  0530    PT Time Calculation (min)  45 min    Activity Tolerance  Patient tolerated treatment well;Patient limited by pain    Behavior During Therapy  [facility] for tasks assessed/performed;Impulsive       Past Medical History:  Diagnosis Date  . Diabetes mellitus without complication ([place])   . Endometrial adenocarcinoma ([place])   . GERD (gastroesophageal reflux disease)   . Hematochezia   . Hyperlipidemia   . Hypertension   . IDA (iron deficiency anemia)   . Inguinal lymphadenopathy 07/13/2015  . Irritable bowel syndrome   . Obesity   . Osteoarthritis   . Sleep apnea     Past Surgical History:  Procedure Laterality Date  . ABDOMINAL HYSTERECTOMY  feb 2013  . COLONOSCOPY WITH PROPOFOL N/A 08/02/2015   Procedure: COLONOSCOPY WITH PROPOFOL;  Surgeon: Manya Silvas, MD;  Location: [facility] ENDOSCOPY;  Service: Endoscopy;  Laterality: N/A;  . ESOPHAGOGASTRODUODENOSCOPY (EGD) WITH PROPOFOL N/A 08/02/2015   Procedure: ESOPHAGOGASTRODUODENOSCOPY (EGD) WITH PROPOFOL;  Surgeon: Manya Silvas, MD;  Location: [facility] ENDOSCOPY;  Service: Endoscopy;  Laterality: N/A;    There were no vitals filed for this visit.  Subjective Assessment - 08/27/17 1652    Subjective  Patient reports 3/10 pain in L shoulder today. Reports compliance with HEP with no questions or concerns.     Pertinent History  Patient is 57 year old female that reports a fall resulting in a chair falling onto L shoulder and wrist at the beginning of  March. Patient reports that MD instructed her to wear shoulder sling as needed in community and follow up appointment Dr. Theresa Mulligan (3/27) instructed patient to wear L wrist brace for wrist sprain. Patient is scheduled for MRI 4/8 for shoulder and wrist. Patient reports worst pain in L shoulder 10/10 in past week, worst pain in L wrist 10/10. Patient reports the best her pain has been in the R shoulder in the past week: 8/10 wrist:  9/10    Limitations  House hold activities;Lifting;Writing;Sitting;Standing;Walking    How long can you sit comfortably?  Pain in all positions    How long can you stand comfortably?  Pain in all positions    How long can you walk comfortably?  Pain in all positions    Diagnostic tests  MRI scheduled 4/8    Patient Stated Goals  decrease pain    Pain Onset  1 to 4 weeks ago         Ther-Ex -Pulleys 10x flexion 10x abd for warm up during hx intake -Overhead ball throws to rebounder with bilat UE catching 2KG ball with max cuing initially for proper form 3x 10 -Overhead farmers carry 4# 24f (163fdown, 1062fack) x3 with cuing to maintain scapular stability  -Farmers carry 6# [id number], 10ft 16f) x3 with cuing to maintain even shoulder height -Low rows green tband 3x 10 with cuing to maintain elbow ext  Manual -GHJ AP mobs grade III 30sec bouts 8 bouts  STM withtrigger point releaseto R latissimusand R bicep muscle belly (prolonged time spent at R latissimus) -Multiple bouts of PROM into abd/IR/ER/flex with focus on abd and IR as they are most limited                        PT Education - 08/27/17 1656    Education provided  Yes    Education Details  Exercise form    Person(s) Educated  Patient    Methods  Explanation;Verbal cues;Tactile cues    Comprehension  Verbalized understanding;Returned demonstration;Verbal cues required;Tactile cues required       PT Short Term Goals - 06/04/17 0938      PT SHORT TERM GOAL #1   Title   Pt will be independent with HEP in order to improve strength and balance in order to decrease fall risk and improve function at home and work.    Time  2    Period  Weeks    Status  New    Target Date  06/18/17        PT Long Term Goals - 07/29/17 1309      PT LONG TERM GOAL #1   Title  Patient will increase FOTO score to 56 to demonstrate predicted increase in functional mobility to complete ADLs    Baseline  5/29 62    Time  8    Period  Weeks    Status  Achieved      PT LONG TERM GOAL #2   Title  Pt will decrease worst pain as reported on NPRS by at least 3 points in order to demonstrate clinically significant reduction in pain.    Baseline  07/29/17 4/10     Time  8    Period  Weeks    Status  Achieved      PT LONG TERM GOAL #3   Title  Pt will be able to open a jar to each side successfully without pain in order to complete ADLs    Baseline  5/29 unable d/t wrist pain    Time  8    Status  Not Met      PT LONG TERM GOAL #4   Title  Patient will demonstrate L grip strength of 15kg to return to PLOF in order to complete ADLs    Baseline  5/29 10kg    Time  8    Period  Weeks    Status  On-going      PT LONG TERM GOAL #5   Title  Patient will demonstrate symmetrical shoulder strength in order to safely complete overhead ADLs    Baseline  5/29:  flex L4/5 R 4+/5; ext L 5/5 R 4+/5; abd L 4/5 R 4+/5; IR L 4+/5  R 4/5; ER L 5/5 R 4/5    Time  6    Period  Weeks    Status  New      Additional Long Term Goals   Additional Long Term Goals  Yes      PT LONG TERM GOAL #6   Title  Patient will demonstrate symmetrical shoulder apleys IR/ER in order to complete ADLs    Baseline  5/29 IR: L: L1 R: T10 ER L C2 R C*    Time  6    Period  Weeks    Status  New  Plan - 08/27/17 1755    Clinical Impression Statement  PT continued progression with scapular stability focus. Patient is demonstrating better ability to self correct form with cuing and understanding  proper scapular positioning with scauplar depression with shoulder elevation. Patient continuing to respond well to manual techniques with decreased muscle tension noted after.     Rehab Potential  Fair    Clinical Impairments Affecting Rehab Potential  (-) transportation, multiple comorbidities (DM II, obesity, GERD, communication barrier, sedentary lifestyle (+) positive attitude,     PT Frequency  2x / week    PT Duration  8 weeks    PT Treatment/Interventions  Neuromuscular re-education;Passive range of motion;Manual techniques;Patient/family education;Taping;Therapeutic exercise;Therapeutic activities;Functional mobility training;Moist Heat;Ultrasound;Cryotherapy;Electrical Stimulation    PT Next Visit Plan  continue RTC strengthening as able with scapular stability focus    PT Home Exercise Plan  Revised 5/29: standing rows with blue tband, ER with blue tband, IR with blue band    Consulted and Agree with Plan of Care  Patient       Patient will benefit from skilled therapeutic intervention in order to improve the following deficits and impairments:  Increased fascial restricitons, Pain, Improper body mechanics, Increased muscle spasms, Impaired tone, Postural dysfunction, Decreased activity tolerance, Decreased endurance, Decreased range of motion, Decreased strength, Impaired flexibility  Visit Diagnosis: Chronic left shoulder pain     Problem List Patient Active Problem List   Diagnosis Date Noted  . Lymphedema 01/17/2016  . Venous (peripheral) insufficiency 01/17/2016  . Pain in limb 01/17/2016  . Iron deficiency anemia due to chronic blood loss 01/09/2016  . History of endometrial cancer 08/01/2015  . Endometrial adenocarcinoma ([place]) 07/13/2015    Class: History of  . Inguinal lymphadenopathy 07/13/2015  . Long term current use of insulin ([place]) 12/12/2013  . Microalbuminuria 12/12/2013  . Diabetes ([place]) 07/28/2013  . BP (high blood pressure) 07/28/2013  . HLD  (hyperlipidemia) 07/28/2013  . Adiposity 07/28/2013  . Obstructive apnea 07/28/2013  . Arthritis, degenerative 07/28/2013  . Apnea, sleep 07/28/2013  . Thyroid nodule 07/28/2013   [name] PT, DPT [name] 08/27/2017, 6:01 PM  [place] [facility] [address] [place], [address] Phone: [phone]   Fax:  [phone]  Name: Emmarie Sannes MRN: [id number] Date of Birth: 1960-07-25

## 2017-09-01 ENCOUNTER — Encounter: Payer: Self-pay | Admitting: Physical Therapy

## 2017-09-01 ENCOUNTER — Ambulatory Visit: Payer: Medicare Other | Attending: Internal Medicine | Admitting: Physical Therapy

## 2017-09-01 DIAGNOSIS — M25532 Pain in left wrist: Secondary | ICD-10-CM | POA: Insufficient documentation

## 2017-09-01 DIAGNOSIS — G8929 Other chronic pain: Secondary | ICD-10-CM | POA: Insufficient documentation

## 2017-09-01 DIAGNOSIS — M25632 Stiffness of left wrist, not elsewhere classified: Secondary | ICD-10-CM | POA: Insufficient documentation

## 2017-09-01 DIAGNOSIS — M6281 Muscle weakness (generalized): Secondary | ICD-10-CM | POA: Insufficient documentation

## 2017-09-01 DIAGNOSIS — M25512 Pain in left shoulder: Secondary | ICD-10-CM | POA: Diagnosis present

## 2017-09-01 NOTE — Therapy (Signed)
[place] [facility] [address] [place], [address] Phone: [phone]   Fax:  [phone]  Physical Therapy Treatment  Patient Details  Name: Alline Pio MRN: [id number] Date of Birth: [birth year] Referring Provider: Arvella Nigh   Encounter Date: 09/01/2017  PT End of Session - 09/01/17 1321    Visit Number  21    Number of Visits  29    Date for PT Re-Evaluation  09/09/17    PT Start Time  0100    PT Stop Time  0145    PT Time Calculation (min)  45 min    Activity Tolerance  Patient tolerated treatment well;Patient limited by pain    Behavior During Therapy  [facility] for tasks assessed/performed;Impulsive       Past Medical History:  Diagnosis Date  . Diabetes mellitus without complication ([place])   . Endometrial adenocarcinoma ([place])   . GERD (gastroesophageal reflux disease)   . Hematochezia   . Hyperlipidemia   . Hypertension   . IDA (iron deficiency anemia)   . Inguinal lymphadenopathy 07/13/2015  . Irritable bowel syndrome   . Obesity   . Osteoarthritis   . Sleep apnea     Past Surgical History:  Procedure Laterality Date  . ABDOMINAL HYSTERECTOMY  feb 2013  . COLONOSCOPY WITH PROPOFOL N/A 08/02/2015   Procedure: COLONOSCOPY WITH PROPOFOL;  Surgeon: Manya Silvas, MD;  Location: [facility] ENDOSCOPY;  Service: Endoscopy;  Laterality: N/A;  . ESOPHAGOGASTRODUODENOSCOPY (EGD) WITH PROPOFOL N/A 08/02/2015   Procedure: ESOPHAGOGASTRODUODENOSCOPY (EGD) WITH PROPOFOL;  Surgeon: Manya Silvas, MD;  Location: [facility] ENDOSCOPY;  Service: Endoscopy;  Laterality: N/A;    There were no vitals filed for this visit.  Subjective Assessment - 09/01/17 1304    Subjective  Patient reports 3/10 pain in L shoulder today. Patient reports shoulder feels "tight" more than "painful" Reports compliance with HEP with no questions or concerns.     Pertinent History  Patient is 57 year old female that reports a fall resulting in a  chair falling onto L shoulder and wrist at the beginning of March. Patient reports that MD instructed her to wear shoulder sling as needed in community and follow up appointment Dr. Theresa Mulligan (3/27) instructed patient to wear L wrist brace for wrist sprain. Patient is scheduled for MRI 4/8 for shoulder and wrist. Patient reports worst pain in L shoulder 10/10 in past week, worst pain in L wrist 10/10. Patient reports the best her pain has been in the R shoulder in the past week: 8/10 wrist:  9/10    Limitations  House hold activities;Lifting;Writing;Sitting;Standing;Walking    How long can you sit comfortably?  Pain in all positions    How long can you stand comfortably?  Pain in all positions    How long can you walk comfortably?  Pain in all positions    Diagnostic tests  MRI scheduled 4/8    Patient Stated Goals  decrease pain    Pain Onset  More than a month ago    Pain Onset  1 to 4 weeks ago       Ther-Ex -Pulleys 10x flexion 10x abd for warm up during hx intake -Standing ER in 90/90 position with max TC/VC and demo needed initally for proper form with 50% carry over without continuous cuing 4x 10 with prolonged rest between seats with increased fatigue noted  -Engineer, water press with 4# DB 1x 10 2x12  with cuing for proper ROM/form throughout -  Seated tricep dips with LE assistance 3x 8 with cuing for full elbow ext and eccentric control -Seated OMEGA rows 1x 10 15# 2x 10 18# with cuing for proper posture without trunk ext compensation  Manual -GHJ AP mobs grade III 30sec bouts 8 bouts in neutral; 8 bouts with ER movement increasing motion as able, and 8 bouts with IR -STM withtrigger point releaseto R latissimusand R bicep muscle belly (prolonged time spent at R latissimus) -Multiple bouts of PROM into abd/IR/ER/flex with focus on abd and IR as they are most limited                         PT Education - 09/01/17 1321    Education provided  Yes     Education Details  Exercise form    Person(s) Educated  Patient    Methods  Explanation;Tactile cues;Verbal cues    Comprehension  Verbalized understanding;Verbal cues required;Tactile cues required       PT Short Term Goals - 06/04/17 0938      PT SHORT TERM GOAL #1   Title  Pt will be independent with HEP in order to improve strength and balance in order to decrease fall risk and improve function at home and work.    Time  2    Period  Weeks    Status  New    Target Date  06/18/17        PT Long Term Goals - 07/29/17 1309      PT LONG TERM GOAL #1   Title  Patient will increase FOTO score to 56 to demonstrate predicted increase in functional mobility to complete ADLs    Baseline  5/29 62    Time  8    Period  Weeks    Status  Achieved      PT LONG TERM GOAL #2   Title  Pt will decrease worst pain as reported on NPRS by at least 3 points in order to demonstrate clinically significant reduction in pain.    Baseline  07/29/17 4/10     Time  8    Period  Weeks    Status  Achieved      PT LONG TERM GOAL #3   Title  Pt will be able to open a jar to each side successfully without pain in order to complete ADLs    Baseline  5/29 unable d/t wrist pain    Time  8    Status  Not Met      PT LONG TERM GOAL #4   Title  Patient will demonstrate L grip strength of 15kg to return to PLOF in order to complete ADLs    Baseline  5/29 10kg    Time  8    Period  Weeks    Status  On-going      PT LONG TERM GOAL #5   Title  Patient will demonstrate symmetrical shoulder strength in order to safely complete overhead ADLs    Baseline  5/29:  flex L4/5 R 4+/5; ext L 5/5 R 4+/5; abd L 4/5 R 4+/5; IR L 4+/5  R 4/5; ER L 5/5 R 4/5    Time  6    Period  Weeks    Status  New      Additional Long Term Goals   Additional Long Term Goals  Yes      PT LONG TERM GOAL #6   Title  Patient will demonstrate  symmetrical shoulder apleys IR/ER in order to complete ADLs    Baseline  5/29 IR: L: L1 R:  T10 ER L C2 R C*    Time  6    Period  Weeks    Status  New            Plan - 09/01/17 1359    Clinical Impression Statement  PT continued to progress resistance exercise which patient is able to tolerate with cuing for most exercises for proper form/muscle activaiton, that patient is able to attempt to correct. PT utilized increased manual techniques for ROM, with patient beginning to demonstrate adhesive capsulitis presentation ER>ABD>IR in PROM restrictions. PT will continue to assess this as PT is able to utilize more manual techniques per patient tolerance, and proceed accordingly.    Rehab Potential  Fair    Clinical Impairments Affecting Rehab Potential  (-) transportation, multiple comorbidities (DM II, obesity, GERD, communication barrier, sedentary lifestyle (+) positive attitude,     PT Frequency  2x / week    PT Duration  8 weeks    PT Treatment/Interventions  Neuromuscular re-education;Passive range of motion;Manual techniques;Patient/family education;Taping;Therapeutic exercise;Therapeutic activities;Functional mobility training;Moist Heat;Ultrasound;Cryotherapy;Electrical Stimulation    PT Next Visit Plan  continue RTC strengthening as able with scapular stability focus    PT Home Exercise Plan  Revised 5/29: standing rows with blue tband, ER with blue tband, IR with blue band    Consulted and Agree with Plan of Care  Patient       Patient will benefit from skilled therapeutic intervention in order to improve the following deficits and impairments:  Increased fascial restricitons, Pain, Improper body mechanics, Increased muscle spasms, Impaired tone, Postural dysfunction, Decreased activity tolerance, Decreased endurance, Decreased range of motion, Decreased strength, Impaired flexibility  Visit Diagnosis: Chronic left shoulder pain     Problem List Patient Active Problem List   Diagnosis Date Noted  . Lymphedema 01/17/2016  . Venous (peripheral) insufficiency  01/17/2016  . Pain in limb 01/17/2016  . Iron deficiency anemia due to chronic blood loss 01/09/2016  . History of endometrial cancer 08/01/2015  . Endometrial adenocarcinoma ([place]) 07/13/2015    Class: History of  . Inguinal lymphadenopathy 07/13/2015  . Long term current use of insulin ([place]) 12/12/2013  . Microalbuminuria 12/12/2013  . Diabetes ([place]) 07/28/2013  . BP (high blood pressure) 07/28/2013  . HLD (hyperlipidemia) 07/28/2013  . Adiposity 07/28/2013  . Obstructive apnea 07/28/2013  . Arthritis, degenerative 07/28/2013  . Apnea, sleep 07/28/2013  . Thyroid nodule 07/28/2013   [name] PT, DPT [name] 09/01/2017, 2:12 PM  [place] [facility] [address], [place], [address] Phone: [phone]   Fax:  [phone]  Name: Caral Whan MRN: [id number] Date of Birth: [birth year]

## 2017-09-02 ENCOUNTER — Encounter: Payer: Medicare Other | Admitting: Occupational Therapy

## 2017-09-07 ENCOUNTER — Ambulatory Visit: Payer: Medicare Other | Admitting: Physical Therapy

## 2017-09-07 ENCOUNTER — Encounter: Payer: Self-pay | Admitting: Physical Therapy

## 2017-09-07 DIAGNOSIS — M25512 Pain in left shoulder: Secondary | ICD-10-CM | POA: Diagnosis not present

## 2017-09-07 DIAGNOSIS — G8929 Other chronic pain: Secondary | ICD-10-CM

## 2017-09-07 NOTE — Therapy (Addendum)
[place] [facility] [address], [place], [address] Phone: [phone]   Fax:  [phone]  Physical Therapy Treatment  Patient Details  Name: Christine Lawson MRN: [id number] Date of Birth: 12/13/1960 Referring Provider: Arvella Nigh   Encounter Date: 09/07/2017    Past Medical History:  Diagnosis Date  . Diabetes mellitus without complication ([place])   . Endometrial adenocarcinoma ([place])   . GERD (gastroesophageal reflux disease)   . Hematochezia   . Hyperlipidemia   . Hypertension   . IDA (iron deficiency anemia)   . Inguinal lymphadenopathy 07/13/2015  . Irritable bowel syndrome   . Obesity   . Osteoarthritis   . Sleep apnea     Past Surgical History:  Procedure Laterality Date  . ABDOMINAL HYSTERECTOMY  feb 2013  . COLONOSCOPY WITH PROPOFOL N/A 08/02/2015   Procedure: COLONOSCOPY WITH PROPOFOL;  Surgeon: Manya Silvas, MD;  Location: [facility] ENDOSCOPY;  Service: Endoscopy;  Laterality: N/A;  . ESOPHAGOGASTRODUODENOSCOPY (EGD) WITH PROPOFOL N/A 08/02/2015   Procedure: ESOPHAGOGASTRODUODENOSCOPY (EGD) WITH PROPOFOL;  Surgeon: Manya Silvas, MD;  Location: [facility] ENDOSCOPY;  Service: Endoscopy;  Laterality: N/A;    There were no vitals filed for this visit.        Ther-Ex -UBE level 0 6 min -Table slides flexion 3x 15 with 3 sec holds (added to HEP while patient cannot complete resistance exercise d/t post-fall pain) with cuing for holds -Table slides abd 3x 15 with 3 sec holds (added to HEP while patient cannot complete resistance exercise d/t post-fall pain) -Doorway bicep/pec stretch 2x 45sec hold -Education on continued PROM/AAROM while patient is having increased pain and is unable to complete resistance exercises to maintain ROM   Manual -GHJ AP mobs grade I-II 30sec bouts 8 bouts in neutral; 8 bouts with ER for pain management  -STM withtrigger point releaseto R latissimusand R bicep muscle  belly (prolonged time spent at R latissimus) -Multiple bouts of PROM into abd/IR/ER/flex with focus on abd and IR as they are most limited                        PT Short Term Goals - 09/10/17 1508      PT SHORT TERM GOAL #1   Title  Pt will be independent with HEP in order to improve strength and balance in order to decrease fall risk and improve function at home and work.    Time  2    Period  Weeks    Status  New        PT Long Term Goals - 09/10/17 1508      PT LONG TERM GOAL #1   Title  Patient will increase FOTO score to 56 to demonstrate predicted increase in functional mobility to complete ADLs    Baseline  5/29 62    Time  8    Period  Weeks    Status  Achieved      PT LONG TERM GOAL #2   Title  Pt will decrease worst pain as reported on NPRS by at least 3 points in order to demonstrate clinically significant reduction in pain.    Baseline  09/10/17 5/10 post fall    Time  8    Period  Weeks    Status  Achieved      PT LONG TERM GOAL #4   Title  Patient will demonstrate L grip strength of 15kg to return to PLOF in  order to complete ADLs    Baseline  7/11: 12kg    Time  4    Period  Weeks    Status  On-going      PT LONG TERM GOAL #5   Title  Patient will demonstrate symmetrical shoulder strength in order to safely complete overhead ADLs    Baseline  7/11:  flex 4+/5 bilat; ext L 5/5 bilat; abd 4+/5 bilat ; IR 4+/5 bilat; ER L 5/5 R 4+/5    Time  4    Period  Weeks    Status  Partially Met      PT LONG TERM GOAL #6   Title  Patient will demonstrate symmetrical shoulder apleys IR/ER in order to complete ADLs    Baseline  7/11 IR: L: L1 R: T10  // ER C7 bilat    Time  4    Period  Weeks    Status  Partially Met              Patient will benefit from skilled therapeutic intervention in order to improve the following deficits and impairments:  Increased fascial restricitons, Pain, Improper body mechanics, Increased muscle spasms,  Impaired tone, Postural dysfunction, Decreased activity tolerance, Decreased endurance, Decreased range of motion, Decreased strength, Impaired flexibility  Visit Diagnosis: Chronic left shoulder pain     Problem List Patient Active Problem List   Diagnosis Date Noted  . Lymphedema 01/17/2016  . Venous (peripheral) insufficiency 01/17/2016  . Pain in limb 01/17/2016  . Iron deficiency anemia due to chronic blood loss 01/09/2016  . History of endometrial cancer 08/01/2015  . Endometrial adenocarcinoma ([place]) 07/13/2015    Class: History of  . Inguinal lymphadenopathy 07/13/2015  . Long term current use of insulin ([place]) 12/12/2013  . Microalbuminuria 12/12/2013  . Diabetes ([place]) 07/28/2013  . BP (high blood pressure) 07/28/2013  . HLD (hyperlipidemia) 07/28/2013  . Adiposity 07/28/2013  . Obstructive apnea 07/28/2013  . Arthritis, degenerative 07/28/2013  . Apnea, sleep 07/28/2013  . Thyroid nodule 07/28/2013   [name] PT, DPT [name] 09/17/2017, 12:33 PM  [place] [facility] [address] [place], [address] Phone: [phone]   Fax:  [phone]  Name: Christine Lawson MRN: [id number] Date of Birth: 12-Jun-1960

## 2017-09-10 ENCOUNTER — Encounter: Payer: Self-pay | Admitting: Physical Therapy

## 2017-09-10 ENCOUNTER — Ambulatory Visit: Payer: Medicare Other | Admitting: Physical Therapy

## 2017-09-10 ENCOUNTER — Ambulatory Visit: Payer: Medicare Other | Admitting: Occupational Therapy

## 2017-09-10 DIAGNOSIS — G8929 Other chronic pain: Secondary | ICD-10-CM

## 2017-09-10 DIAGNOSIS — M25512 Pain in left shoulder: Secondary | ICD-10-CM | POA: Diagnosis not present

## 2017-09-10 NOTE — Therapy (Signed)
[place] [facility] [address], [place], [address] Phone: [phone]   Fax:  [phone]  Physical Therapy Treatment  Patient Details  Name: Christine Lawson MRN: [id number] Date of Birth: Dec 26, 1960 Referring Provider: Arvella Nigh   Encounter Date: 09/10/2017  PT End of Session - 09/10/17 1507    Visit Number  23    Number of Visits  33    Date for PT Re-Evaluation  10/09/17    PT Start Time  0300    PT Stop Time  0345    PT Time Calculation (min)  45 min    Activity Tolerance  Patient tolerated treatment well;Patient limited by pain    Behavior During Therapy  [facility] for tasks assessed/performed;Impulsive       Past Medical History:  Diagnosis Date  . Diabetes mellitus without complication ([place])   . Endometrial adenocarcinoma ([place])   . GERD (gastroesophageal reflux disease)   . Hematochezia   . Hyperlipidemia   . Hypertension   . IDA (iron deficiency anemia)   . Inguinal lymphadenopathy 07/13/2015  . Irritable bowel syndrome   . Obesity   . Osteoarthritis   . Sleep apnea     Past Surgical History:  Procedure Laterality Date  . ABDOMINAL HYSTERECTOMY  feb 2013  . COLONOSCOPY WITH PROPOFOL N/A 08/02/2015   Procedure: COLONOSCOPY WITH PROPOFOL;  Surgeon: Manya Silvas, MD;  [facility] ENDOSCOPY;  Service: Endoscopy;  Laterality: N/A;  . ESOPHAGOGASTRODUODENOSCOPY (EGD) WITH PROPOFOL N/A 08/02/2015   Procedure: ESOPHAGOGASTRODUODENOSCOPY (EGD) WITH PROPOFOL;  Surgeon: Manya Silvas, MD;  Location: [facility] ENDOSCOPY;  Service: Endoscopy;  Laterality: N/A;    There were no vitals filed for this visit.  Subjective Assessment - 09/10/17 1505    Subjective  Patient report 5/10 pain today. Patient reports "some" compliance with her HEP. No questions or concerns at this time.     Pertinent History  Patient is 57 year old female that reports a fall resulting in a chair falling onto L shoulder and wrist at the  beginning of March. Patient reports that MD instructed her to wear shoulder sling as needed in community and follow up appointment Dr. Theresa Mulligan (3/27) instructed patient to wear L wrist brace for wrist sprain. Patient is scheduled for MRI 4/8 for shoulder and wrist. Patient reports worst pain in L shoulder 10/10 in past week, worst pain in L wrist 10/10. Patient reports the best her pain has been in the R shoulder in the past week: 8/10 wrist:  9/10    Limitations  House hold activities;Lifting;Writing;Sitting;Standing;Walking    How long can you sit comfortably?  Pain in all positions    How long can you stand comfortably?  Pain in all positions    How long can you walk comfortably?  Pain in all positions    Diagnostic tests  MRI scheduled 4/8    Patient Stated Goals  decrease pain    Pain Onset  More than a month ago    Pain Onset  1 to 4 weeks ago       Ther-Ex -UBE forward 18mn backward 213m L1 -Seated ER green tband 3x 10 (added to HEP) with min cuing for scapular retraction -Military press 2# 3x 10 (added to HEP) with min cuing for proper form  -Standing shoulder scaption 2# 3x 10 (added to HEP) with min cuing for prpoer scapular control -Sleeper stretch x 45sec hold -Supine IR stretch x45sec hold -MMT and AROM assessment with  patient demonstrating deficits only in AROM IR and strength deficit in ER, with report of incresaed pain/weakness with overhead motion -PT and patient discussed HEP update with education of frequency/duration/rep/sets and POC update    Manual -GHJ AP mobs grade III 30sec bouts 8 bouts in neutral; 8 , and 8 bouts with IR -STM withtrigger point releaseto R latissimusand R bicep muscle belly                       PT Education - 09/10/17 1506    Education provided  Yes    Education Details  Exercise form    Person(s) Educated  Patient    Methods  Explanation;Tactile cues;Verbal cues    Comprehension  Verbalized understanding;Verbal cues  required;Tactile cues required       PT Short Term Goals - 09/10/17 1508      PT SHORT TERM GOAL #1   Title  Pt will be independent with HEP in order to improve strength and balance in order to decrease fall risk and improve function at home and work.    Time  2    Period  Weeks    Status  New        PT Long Term Goals - 09/10/17 1508      PT LONG TERM GOAL #1   Title  Patient will increase FOTO score to 56 to demonstrate predicted increase in functional mobility to complete ADLs    Baseline  5/29 62    Time  8    Period  Weeks    Status  Achieved      PT LONG TERM GOAL #2   Title  Pt will decrease worst pain as reported on NPRS by at least 3 points in order to demonstrate clinically significant reduction in pain.    Baseline  09/10/17 5/10 post fall    Time  8    Period  Weeks    Status  Achieved      PT LONG TERM GOAL #4   Title  Patient will demonstrate L grip strength of 15kg to return to PLOF in order to complete ADLs    Baseline  7/11: 12kg    Time  4    Period  Weeks    Status  On-going      PT LONG TERM GOAL #5   Title  Patient will demonstrate symmetrical shoulder strength in order to safely complete overhead ADLs    Baseline  7/11:  flex 4+/5 bilat; ext L 5/5 bilat; abd 4+/5 bilat ; IR 4+/5 bilat; ER L 5/5 R 4+/5    Time  4    Period  Weeks    Status  Partially Met      PT LONG TERM GOAL #6   Title  Patient will demonstrate symmetrical shoulder apleys IR/ER in order to complete ADLs    Baseline  7/11 IR: L: L1 R: T10  // ER C7 bilat    Time  4    Period  Weeks    Status  Partially Met            Plan - 09/10/17 1544    Clinical Impression Statement  PT led reassessment today with patient demonstrating increased strength, ROM and decreased pain, with patient still having deficits in IR AROM and ER strength. PT and patient discussed POC for 4 more weeks to address these deficits and ensure adequate strength for safety in overhead activities. PT  updated patient's  HEP with education and printout.     Rehab Potential  Fair    Clinical Impairments Affecting Rehab Potential  (-) transportation, multiple comorbidities (DM II, obesity, GERD, communication barrier, sedentary lifestyle (+) positive attitude,     PT Frequency  2x / week    PT Duration  8 weeks    PT Treatment/Interventions  Neuromuscular re-education;Passive range of motion;Manual techniques;Patient/family education;Taping;Therapeutic exercise;Therapeutic activities;Functional mobility training;Moist Heat;Ultrasound;Cryotherapy;Electrical Stimulation    PT Next Visit Plan  Supine IR stretch, tband ER, military press, shoulder scaption     PT Home Exercise Plan  Revised 5/29: standing rows with blue tband, ER with blue tband, IR with blue band [id number]    Consulted and Agree with Plan of Care  Patient       Patient will benefit from skilled therapeutic intervention in order to improve the following deficits and impairments:  Increased fascial restricitons, Pain, Improper body mechanics, Increased muscle spasms, Impaired tone, Postural dysfunction, Decreased activity tolerance, Decreased endurance, Decreased range of motion, Decreased strength, Impaired flexibility  Visit Diagnosis: Chronic left shoulder pain     Problem List Patient Active Problem List   Diagnosis Date Noted  . Lymphedema 01/17/2016  . Venous (peripheral) insufficiency 01/17/2016  . Pain in limb 01/17/2016  . Iron deficiency anemia due to chronic blood loss 01/09/2016  . History of endometrial cancer 08/01/2015  . Endometrial adenocarcinoma ([place]) 07/13/2015    Class: History of  . Inguinal lymphadenopathy 07/13/2015  . Long term current use of insulin ([place]) 12/12/2013  . Microalbuminuria 12/12/2013  . Diabetes ([place]) 07/28/2013  . BP (high blood pressure) 07/28/2013  . HLD (hyperlipidemia) 07/28/2013  . Adiposity 07/28/2013  . Obstructive apnea 07/28/2013  . Arthritis, degenerative 07/28/2013  .  Apnea, sleep 07/28/2013  . Thyroid nodule 07/28/2013   [name] PT, DPT [name] 09/10/2017, 3:47 PM  [place] [facility] [address] [place], [address] Phone: [phone]   Fax:  [phone]  Name: Christine Lawson MRN: [id number] Date of Birth: [birth year]

## 2017-09-14 ENCOUNTER — Encounter: Payer: Self-pay | Admitting: Physical Therapy

## 2017-09-14 ENCOUNTER — Ambulatory Visit: Payer: Medicare Other | Admitting: Occupational Therapy

## 2017-09-14 ENCOUNTER — Ambulatory Visit: Payer: Medicare Other | Admitting: Physical Therapy

## 2017-09-14 DIAGNOSIS — M25512 Pain in left shoulder: Secondary | ICD-10-CM | POA: Diagnosis not present

## 2017-09-14 DIAGNOSIS — M25532 Pain in left wrist: Secondary | ICD-10-CM

## 2017-09-14 DIAGNOSIS — M25632 Stiffness of left wrist, not elsewhere classified: Secondary | ICD-10-CM

## 2017-09-14 DIAGNOSIS — M6281 Muscle weakness (generalized): Secondary | ICD-10-CM

## 2017-09-14 DIAGNOSIS — G8929 Other chronic pain: Secondary | ICD-10-CM

## 2017-09-14 NOTE — Patient Instructions (Signed)
Contrast - wear neoprene Benik during day with act that cause pain  AROM for wrist flexion and extention - pain free  and RD, UD   15 reps   2 x day  and isometric strengthening - midline - all directions - 10 reps   2 x day   increase to  2 sets in 3 days if no increase pain

## 2017-09-14 NOTE — Therapy (Signed)
[place] [facility] [address] [place], [address] Phone: [phone]   Fax:  [phone]  Occupational Therapy Treatment  Patient Details  Name: Christine Lawson MRN: [id number] Date of Birth: 08/27/1960 Referring Provider: Arvella Nigh   Encounter Date: 09/14/2017  OT End of Session - 09/14/17 1615    Visit Number  7    Number of Visits  12    Date for OT Re-Evaluation  10/12/17    OT Start Time  6962    OT Stop Time  1424    OT Time Calculation (min)  37 min    Activity Tolerance  Patient tolerated treatment well    Behavior During Therapy  [facility] for tasks assessed/performed;Impulsive       Past Medical History:  Diagnosis Date  . Diabetes mellitus without complication ([place])   . Endometrial adenocarcinoma ([place])   . GERD (gastroesophageal reflux disease)   . Hematochezia   . Hyperlipidemia   . Hypertension   . IDA (iron deficiency anemia)   . Inguinal lymphadenopathy 07/13/2015  . Irritable bowel syndrome   . Obesity   . Osteoarthritis   . Sleep apnea     Past Surgical History:  Procedure Laterality Date  . ABDOMINAL HYSTERECTOMY  feb 2013  . COLONOSCOPY WITH PROPOFOL N/A 08/02/2015   Procedure: COLONOSCOPY WITH PROPOFOL;  Surgeon: Manya Silvas, MD;  Location: [facility] ENDOSCOPY;  Service: Endoscopy;  Laterality: N/A;  . ESOPHAGOGASTRODUODENOSCOPY (EGD) WITH PROPOFOL N/A 08/02/2015   Procedure: ESOPHAGOGASTRODUODENOSCOPY (EGD) WITH PROPOFOL;  Surgeon: Manya Silvas, MD;  Location: [facility] ENDOSCOPY;  Service: Endoscopy;  Laterality: N/A;    There were no vitals filed for this visit.  Subjective Assessment - 09/14/17 1500    Subjective   I fell on 4th of July and hurt my wrist and elbow little - so I was not doing the weight - do wear the blue wrist splint when out and about - hurt some - but still bathing more than 50% and dressing my UB - but not helping wiht cooking yet     Patient Stated Goals  I want to  be able to use my hand like before - dressing , bathing , cutting food, lift and carry objects     Currently in Pain?  Yes    Pain Score  4     Pain Location  Wrist    Pain Orientation  Left    Pain Descriptors / Indicators  Aching    Pain Type  Acute pain    Pain Onset  1 to 4 weeks ago         [facility] OT Assessment - 09/14/17 0001      AROM   Left Wrist Extension  60 Degrees    Left Wrist Flexion  70 Degrees    Left Wrist Ulnar Deviation  30 Degrees      Strength   Left Hand Grip (lbs)  25      Reassess AROM for L wrist , grip strength - and palpation of wrist   see flowsheet- increase pain and tenderness over dorsal wrist and increase edema  Increase pain with wrist flexion and extention - dorsal wrist          OT Treatments/Exercises (OP) - 09/14/17 0001      Ultrasound   Ultrasound Location  dorsal wrist    Ultrasound Parameters  3.3MHZ, 20%, 1.0 intensity     Ultrasound Goals  Pain  LUE Fluidotherapy   Number Minutes Fluidotherapy  8 Minutes    LUE Fluidotherapy Location  Hand;Wrist    Comments  decrease pain and increase wrist AROM         Pt to do at home 2-3 x day Contrast - wear neoprene Benik during day with act that cause pain  AROM for wrist flexion and extention - pain free done in clinic in fluido and afterwards   and RD, UD   15 reps   2 x day at home   and isometric strengthening - midline - all directions - 10 reps  - no pain  - and ed sister to assist pt   2 x day  To be done   increase to  2 sets in 3 days if no increase pain       OT Education - 09/14/17 1614    Education provided  Yes    Education Details  findings for reassessment - and update HEP     Person(s) Educated  Patient;Other (comment) sister   sister   Methods  Explanation    Comprehension  Verbalized understanding;Returned demonstration       OT Short Term Goals - 09/14/17 1617      OT SHORT TERM GOAL #1   Title  Pain on L wrist decrease to less than 5/10 at  rest to wean out of splint     Baseline  splint more than 50% during day ,  night time - and pain between 7-10/10 pe rpt     Time  3    Period  Weeks    Status  On-going    Target Date  10/05/17      OT SHORT TERM GOAL #2   Title  Pt to be ind in HEP to decrease pain , increase AROM in wrist in all planes and increase grip     Baseline  increase symptoms since fall - reed on HEP to decrease pain and increase ROM     Time  2    Period  Weeks    Status  On-going    Target Date  09/28/17        OT Long Term Goals - 09/14/17 1618      OT LONG TERM GOAL #1   Title  Pt wrist AROM and strength increase for pt to use L hand in more than 50% of bathing , cut food, turn doorknob, without increase symptoms     Baseline  doing bathing and dressing - but not turning doorknob and cutting food     Time  4    Period  Weeks    Status  On-going    Target Date  10/12/17      OT LONG TERM GOAL #2   Title  L grip  increase with more than 5 lbs and prehension strength increase with more than 3 lbs to use hand in cooking , laudry and cut food     Baseline  L grip 20 , R 35, - grip decrease with 10 lbs since fall     Time  4    Period  Weeks    Status  On-going    Target Date  10/12/17            Plan - 09/14/17 1616    Clinical Impression Statement  Pt did very well in L wrist pain , ROM , strength and use -but she report she fell on 4th of July and  wrist was hurting since then - pt do show increase pain with flexion and extention of wrist - decrease AROM - and decrease grip - pt to do contrast and only pain free AROM - hold off on any weight     Occupational performance deficits (Please refer to evaluation for details):  ADL's;IADL's;Play;Leisure    Rehab Potential  Fair    Current Impairments/barriers affecting progress:  co-morbitities     OT Frequency  1x / week    OT Duration  4 weeks    OT Treatment/Interventions  Self-care/ADL training;Iontophoresis;Therapeutic  exercise;Ultrasound;Contrast Bath;Fluidtherapy;Manual Therapy;Splinting;Patient/family education    Plan  reassess and upgrade HEP as needed     Clinical Decision Making  Multiple treatment options, significant modification of task necessary    OT Home Exercise Plan  See pt instruction    Consulted and Agree with Plan of Care  Patient       Patient will benefit from skilled therapeutic intervention in order to improve the following deficits and impairments:  Pain, Impaired flexibility, Increased edema, Decreased strength, Decreased range of motion, Impaired UE functional use  Visit Diagnosis: Pain in left wrist - Plan: Ot plan of care cert/re-cert  Stiffness of left wrist, not elsewhere classified - Plan: Ot plan of care cert/re-cert  Muscle weakness (generalized) - Plan: Ot plan of care cert/re-cert    Problem List Patient Active Problem List   Diagnosis Date Noted  . Lymphedema 01/17/2016  . Venous (peripheral) insufficiency 01/17/2016  . Pain in limb 01/17/2016  . Iron deficiency anemia due to chronic blood loss 01/09/2016  . History of endometrial cancer 08/01/2015  . Endometrial adenocarcinoma ([place]) 07/13/2015    Class: History of  . Inguinal lymphadenopathy 07/13/2015  . Long term current use of insulin ([place]) 12/12/2013  . Microalbuminuria 12/12/2013  . Diabetes ([place]) 07/28/2013  . BP (high blood pressure) 07/28/2013  . HLD (hyperlipidemia) 07/28/2013  . Adiposity 07/28/2013  . Obstructive apnea 07/28/2013  . Arthritis, degenerative 07/28/2013  . Apnea, sleep 07/28/2013  . Thyroid nodule 07/28/2013    Rosalyn Gess OTR/L,CLT 09/14/2017, 4:23 PM  [place] [facility] [address] [place], [address] Phone: [phone]   Fax:  [phone]  Name: Schuyler Behan MRN: [id number] Date of Birth: Jul 05, 1960

## 2017-09-14 NOTE — Therapy (Signed)
[place] [facility] [address] [place], [address] Phone: [phone]   Fax:  [phone]  Physical Therapy Treatment  Patient Details  Name: Christine Lawson MRN: [id number] Date of Birth: [birth year] Referring Provider: Arvella Nigh   Encounter Date: 09/14/2017  PT End of Session - 09/14/17 1432    Visit Number  24    Number of Visits  33    Date for PT Re-Evaluation  10/09/17    PT Start Time  0230    PT Stop Time  0315    PT Time Calculation (min)  45 min    Activity Tolerance  Patient tolerated treatment well;Patient limited by pain    Behavior During Therapy  [facility] for tasks assessed/performed;Impulsive       Past Medical History:  Diagnosis Date  . Diabetes mellitus without complication ([place])   . Endometrial adenocarcinoma ([place])   . GERD (gastroesophageal reflux disease)   . Hematochezia   . Hyperlipidemia   . Hypertension   . IDA (iron deficiency anemia)   . Inguinal lymphadenopathy 07/13/2015  . Irritable bowel syndrome   . Obesity   . Osteoarthritis   . Sleep apnea     Past Surgical History:  Procedure Laterality Date  . ABDOMINAL HYSTERECTOMY  feb 2013  . COLONOSCOPY WITH PROPOFOL N/A 08/02/2015   Procedure: COLONOSCOPY WITH PROPOFOL;  Surgeon: Manya Silvas, MD;  [facility] ENDOSCOPY;  Service: Endoscopy;  Laterality: N/A;  . ESOPHAGOGASTRODUODENOSCOPY (EGD) WITH PROPOFOL N/A 08/02/2015   Procedure: ESOPHAGOGASTRODUODENOSCOPY (EGD) WITH PROPOFOL;  Surgeon: Manya Silvas, MD;  Location: [facility] ENDOSCOPY;  Service: Endoscopy;  Laterality: N/A;    There were no vitals filed for this visit.  Subjective Assessment - 09/14/17 1429    Subjective  Patient reports 5/10 pain in the shoulder today. Patient reports compliance with her HEP with no questions or concerns.     Pertinent History  Patient is 57 year old female that reports a fall resulting in a chair falling onto L shoulder and wrist at the  beginning of March. Patient reports that MD instructed her to wear shoulder sling as needed in community and follow up appointment Dr. Theresa Mulligan (3/27) instructed patient to wear L wrist brace for wrist sprain. Patient is scheduled for MRI 4/8 for shoulder and wrist. Patient reports worst pain in L shoulder 10/10 in past week, worst pain in L wrist 10/10. Patient reports the best her pain has been in the R shoulder in the past week: 8/10 wrist:  9/10    Limitations  House hold activities;Lifting;Writing;Sitting;Standing;Walking    How long can you sit comfortably?  Pain in all positions    How long can you stand comfortably?  Pain in all positions    How long can you walk comfortably?  Pain in all positions    Diagnostic tests  MRI scheduled 4/8    Pain Onset  More than a month ago    Pain Onset  1 to 4 weeks ago        Ther-Ex -UBE forward 59mn backward 275m L1 -Overhead farmers carry with 3# cuff weight 3x 2064fOT does not want resistance DB d/t increased wrist pain/swelling) with min cuing for proper scapular alignment -Farmers carry with 5# cuff wt 3x [id number] min cuing needed for proper posture initially with good carry over between sets -ER with green tband 3x 10 with 3 sec hold in max ER  -IR with green tband 3x 12 with  min cuing to prevent trunk rotation compensation and for eccentric control -Seated incline press 3x 10 10# with min cuing for eccentric control   Manual -GHJ AP mobs grade III 30sec bouts 8 boutsin neutral; 8 , and 8 bouts with IR -STM withtrigger point releaseto R latissimus/teres minorand R bicep muscle belly                          PT Education - 09/14/17 1432    Education provided  Yes    Education Details  Exercise form    Person(s) Educated  Patient    Methods  Explanation;Tactile cues;Verbal cues;Demonstration    Comprehension  Verbalized understanding;Returned demonstration;Verbal cues required;Tactile cues required       PT  Short Term Goals - 09/10/17 1508      PT SHORT TERM GOAL #1   Title  Pt will be independent with HEP in order to improve strength and balance in order to decrease fall risk and improve function at home and work.    Time  2    Period  Weeks    Status  New        PT Long Term Goals - 09/10/17 1508      PT LONG TERM GOAL #1   Title  Patient will increase FOTO score to 56 to demonstrate predicted increase in functional mobility to complete ADLs    Baseline  5/29 62    Time  8    Period  Weeks    Status  Achieved      PT LONG TERM GOAL #2   Title  Pt will decrease worst pain as reported on NPRS by at least 3 points in order to demonstrate clinically significant reduction in pain.    Baseline  09/10/17 5/10 post fall    Time  8    Period  Weeks    Status  Achieved      PT LONG TERM GOAL #4   Title  Patient will demonstrate L grip strength of 15kg to return to PLOF in order to complete ADLs    Baseline  7/11: 12kg    Time  4    Period  Weeks    Status  On-going      PT LONG TERM GOAL #5   Title  Patient will demonstrate symmetrical shoulder strength in order to safely complete overhead ADLs    Baseline  7/11:  flex 4+/5 bilat; ext L 5/5 bilat; abd 4+/5 bilat ; IR 4+/5 bilat; ER L 5/5 R 4+/5    Time  4    Period  Weeks    Status  Partially Met      PT LONG TERM GOAL #6   Title  Patient will demonstrate symmetrical shoulder apleys IR/ER in order to complete ADLs    Baseline  7/11 IR: L: L1 R: T10  // ER C7 bilat    Time  4    Period  Weeks    Status  Partially Met            Plan - 09/14/17 1509    Clinical Impression Statement  PT continued to progress therex intensity and increased motor recruitment. Patient was able to tolerate this with no increased pain, only noted muscle fatigue, with cuing needed for proper form (esp scapulo-humeral rhythm) with functional movements, which she was able to demonstrate good carry over with.     Clinical Impairments Affecting Rehab  Potential  (-)  transportation, multiple comorbidities (DM II, obesity, GERD, communication barrier, sedentary lifestyle (+) positive attitude,     PT Frequency  2x / week    PT Duration  8 weeks    PT Treatment/Interventions  Neuromuscular re-education;Passive range of motion;Manual techniques;Patient/family education;Taping;Therapeutic exercise;Therapeutic activities;Functional mobility training;Moist Heat;Ultrasound;Cryotherapy;Electrical Stimulation    PT Next Visit Plan  Supine IR stretch, tband ER, military press, shoulder scaption     PT Home Exercise Plan  Revised 5/29: standing rows with blue tband, ER with blue tband, IR with blue band [id number]    Consulted and Agree with Plan of Care  Patient       Patient will benefit from skilled therapeutic intervention in order to improve the following deficits and impairments:  Increased fascial restricitons, Pain, Improper body mechanics, Increased muscle spasms, Impaired tone, Postural dysfunction, Decreased activity tolerance, Decreased endurance, Decreased range of motion, Decreased strength, Impaired flexibility  Visit Diagnosis: Chronic left shoulder pain     Problem List Patient Active Problem List   Diagnosis Date Noted  . Lymphedema 01/17/2016  . Venous (peripheral) insufficiency 01/17/2016  . Pain in limb 01/17/2016  . Iron deficiency anemia due to chronic blood loss 01/09/2016  . History of endometrial cancer 08/01/2015  . Endometrial adenocarcinoma ([place]) 07/13/2015    Class: History of  . Inguinal lymphadenopathy 07/13/2015  . Long term current use of insulin ([place]) 12/12/2013  . Microalbuminuria 12/12/2013  . Diabetes ([place]) 07/28/2013  . BP (high blood pressure) 07/28/2013  . HLD (hyperlipidemia) 07/28/2013  . Adiposity 07/28/2013  . Obstructive apnea 07/28/2013  . Arthritis, degenerative 07/28/2013  . Apnea, sleep 07/28/2013  . Thyroid nodule 07/28/2013   [name] PT, DPT [name] 09/14/2017, 3:12  PM  [place] [facility] [address], [place], [address] Phone: [phone]   Fax:  [phone]  Name: Christine Lawson MRN: [id number] Date of Birth: [birth year]

## 2017-09-16 ENCOUNTER — Encounter: Payer: Medicare Other | Admitting: Occupational Therapy

## 2017-09-16 ENCOUNTER — Ambulatory Visit: Payer: Medicare Other | Admitting: Physical Therapy

## 2017-09-21 ENCOUNTER — Ambulatory Visit: Payer: Medicare Other | Admitting: Occupational Therapy

## 2017-09-21 ENCOUNTER — Ambulatory Visit: Payer: Medicare Other | Admitting: Physical Therapy

## 2017-09-21 ENCOUNTER — Encounter: Payer: Self-pay | Admitting: Physical Therapy

## 2017-09-21 DIAGNOSIS — M25512 Pain in left shoulder: Principal | ICD-10-CM

## 2017-09-21 DIAGNOSIS — M6281 Muscle weakness (generalized): Secondary | ICD-10-CM

## 2017-09-21 DIAGNOSIS — M25532 Pain in left wrist: Secondary | ICD-10-CM

## 2017-09-21 DIAGNOSIS — G8929 Other chronic pain: Secondary | ICD-10-CM

## 2017-09-21 DIAGNOSIS — M25632 Stiffness of left wrist, not elsewhere classified: Secondary | ICD-10-CM

## 2017-09-21 NOTE — Therapy (Signed)
[facility] [address], [place], [address] Phone: [phone]   Fax:  [phone]  Occupational Therapy Treatment  Patient Details  Name: Christine Lawson MRN: [id number] Date of Birth: June 28, 1960 Referring Provider: Arvella Nigh   Encounter Date: 09/21/2017  OT End of Session - 09/21/17 1446    Visit Number  8    Number of Visits  12    Date for OT Re-Evaluation  10/12/17    OT Start Time  1346    OT Stop Time  1430    OT Time Calculation (min)  44 min    Activity Tolerance  Patient tolerated treatment well    Behavior During Therapy  [facility] for tasks assessed/performed;Impulsive       Past Medical History:  Diagnosis Date  . Diabetes mellitus without complication ([place])   . Endometrial adenocarcinoma ([place])   . GERD (gastroesophageal reflux disease)   . Hematochezia   . Hyperlipidemia   . Hypertension   . IDA (iron deficiency anemia)   . Inguinal lymphadenopathy 07/13/2015  . Irritable bowel syndrome   . Obesity   . Osteoarthritis   . Sleep apnea     Past Surgical History:  Procedure Laterality Date  . ABDOMINAL HYSTERECTOMY  feb 2013  . COLONOSCOPY WITH PROPOFOL N/A 08/02/2015   Procedure: COLONOSCOPY WITH PROPOFOL;  Surgeon: Manya Silvas, MD;  Location: [facility] ENDOSCOPY;  Service: Endoscopy;  Laterality: N/A;  . ESOPHAGOGASTRODUODENOSCOPY (EGD) WITH PROPOFOL N/A 08/02/2015   Procedure: ESOPHAGOGASTRODUODENOSCOPY (EGD) WITH PROPOFOL;  Surgeon: Manya Silvas, MD;  Location: [facility] ENDOSCOPY;  Service: Endoscopy;  Laterality: N/A;    There were no vitals filed for this visit.  Subjective Assessment - 09/21/17 1444    Subjective   My wrist is still sore - If I had done some stuff - it it sore and still swollen - I did not do my contrast as I should had     Patient Stated Goals  I want to be able to use my hand like before - dressing , bathing , cutting food, lift and carry objects     Currently in  Pain?  Yes    Pain Score  4     Pain Location  Wrist    Pain Orientation  Left    Pain Descriptors / Indicators  Aching    Pain Type  Acute pain    Pain Onset  1 to 4 weeks ago    Pain Frequency  Constant         [facility] OT Assessment - 09/21/17 0001      AROM   Left Wrist Extension  60 Degrees    Left Wrist Flexion  80 Degrees      Strength   Left Hand Grip (lbs)  25        Assess AROM for wrist and grip - compare to last time - wrist flexion improve but still decrease compare to prior to last fall  And still tender over dorsal wrist - and swollen  Ed on donning correctly Benik splint         OT Treatments/Exercises (OP) - 09/21/17 0001      Ultrasound   Ultrasound Location  dorsal wrist     Ultrasound Parameters  .3MHZ, 20%, 1.0 intensity     Ultrasound Goals  Edema;Pain      LUE Fluidotherapy   Number Minutes Fluidotherapy  8 Minutes    LUE Fluidotherapy Location  Hand;Wrist  Comments  prior to soft tissue to decrease pain        Pt to do at home 2-3 x day Contrast - wear neoprene Benik during day with act that cause pain  AROM for wrist flexion and extention - pain free done in clinic in fluido and afterwards   and RD, UD   15 reps   2 x day at home   and isometric strengthening - midline - all directions - 10 reps  - no pain  - and ed sister to assist pt   2 x day  To be done   increase to  2 sets in 3 days if no increase pain  And increase ice use for pain and edema        OT Education - 09/21/17 1446    Education provided  Yes    Education Details  HEP to cont with     Person(s) Educated  Patient    Methods  Demonstration    Comprehension  Returned demonstration;Verbalized understanding       OT Short Term Goals - 09/14/17 1617      OT SHORT TERM GOAL #1   Title  Pain on L wrist decrease to less than 5/10 at rest to wean out of splint     Baseline  splint more than 50% during day ,  night time - and pain between 7-10/10 pe rpt     Time   3    Period  Weeks    Status  On-going    Target Date  10/05/17      OT SHORT TERM GOAL #2   Title  Pt to be ind in HEP to decrease pain , increase AROM in wrist in all planes and increase grip     Baseline  increase symptoms since fall - reed on HEP to decrease pain and increase ROM     Time  2    Period  Weeks    Status  On-going    Target Date  09/28/17        OT Long Term Goals - 09/14/17 1618      OT LONG TERM GOAL #1   Title  Pt wrist AROM and strength increase for pt to use L hand in more than 50% of bathing , cut food, turn doorknob, without increase symptoms     Baseline  doing bathing and dressing - but not turning doorknob and cutting food     Time  4    Period  Weeks    Status  On-going    Target Date  10/12/17      OT LONG TERM GOAL #2   Title  L grip  increase with more than 5 lbs and prehension strength increase with more than 3 lbs to use hand in cooking , laudry and cut food     Baseline  L grip 20 , R 35, - grip decrease with 10 lbs since fall     Time  4    Period  Weeks    Status  On-going    Target Date  10/12/17            Plan - 09/21/17 1447    Clinical Impression Statement  Pt cont to show increase edema and pain over dorsal wrist since her fall on 4th of July - AROM at wrist improve but still pain with flexion and extnetion - cont with same HEP     Occupational performance deficits (  Please refer to evaluation for details):  ADL's;IADL's;Play;Leisure    Rehab Potential  Fair    Current Impairments/barriers affecting progress:  co-morbitities     OT Frequency  1x / week    OT Duration  4 weeks    OT Treatment/Interventions  Self-care/ADL training;Iontophoresis;Therapeutic exercise;Ultrasound;Contrast Bath;Fluidtherapy;Manual Therapy;Splinting;Patient/family education    Plan  reassess and upgrade HEP as needed     Clinical Decision Making  Multiple treatment options, significant modification of task necessary    OT Home Exercise Plan  See pt  instruction    Consulted and Agree with Plan of Care  Patient       Patient will benefit from skilled therapeutic intervention in order to improve the following deficits and impairments:  Pain, Impaired flexibility, Increased edema, Decreased strength, Decreased range of motion, Impaired UE functional use  Visit Diagnosis: Pain in left wrist  Stiffness of left wrist, not elsewhere classified  Muscle weakness (generalized)    Problem List Patient Active Problem List   Diagnosis Date Noted  . Lymphedema 01/17/2016  . Venous (peripheral) insufficiency 01/17/2016  . Pain in limb 01/17/2016  . Iron deficiency anemia due to chronic blood loss 01/09/2016  . History of endometrial cancer 08/01/2015  . Endometrial adenocarcinoma ([place]) 07/13/2015    Class: History of  . Inguinal lymphadenopathy 07/13/2015  . Long term current use of insulin ([place]) 12/12/2013  . Microalbuminuria 12/12/2013  . Diabetes ([place]) 07/28/2013  . BP (high blood pressure) 07/28/2013  . HLD (hyperlipidemia) 07/28/2013  . Adiposity 07/28/2013  . Obstructive apnea 07/28/2013  . Arthritis, degenerative 07/28/2013  . Apnea, sleep 07/28/2013  . Thyroid nodule 07/28/2013    Rosalyn Gess OTR/L,CLT 09/21/2017, 2:49 PM  [facility] [address] [place], [address] Phone: [phone]   Fax:  [phone]  Name: Christine Lawson MRN: [id number] Date of Birth: [birth year]

## 2017-09-21 NOTE — Therapy (Signed)
[facility] [address] [place], [address] Phone: [phone]   Fax:  [phone]  Physical Therapy Treatment  Patient Details  Name: Christine Lawson MRN: [id number] Date of Birth: [birth year] Referring Provider: Arvella Nigh   Encounter Date: 09/21/2017  PT End of Session - 09/21/17 1309    Visit Number  25    Number of Visits  33    Date for PT Re-Evaluation  10/09/17    PT Start Time  0100    PT Stop Time  0145    PT Time Calculation (min)  45 min    Activity Tolerance  Patient tolerated treatment well;Patient limited by pain    Behavior During Therapy  [facility] for tasks assessed/performed;Impulsive       Past Medical History:  Diagnosis Date  . Diabetes mellitus without complication ([place])   . Endometrial adenocarcinoma ([place])   . GERD (gastroesophageal reflux disease)   . Hematochezia   . Hyperlipidemia   . Hypertension   . IDA (iron deficiency anemia)   . Inguinal lymphadenopathy 07/13/2015  . Irritable bowel syndrome   . Obesity   . Osteoarthritis   . Sleep apnea     Past Surgical History:  Procedure Laterality Date  . ABDOMINAL HYSTERECTOMY  feb 2013  . COLONOSCOPY WITH PROPOFOL N/A 08/02/2015   Procedure: COLONOSCOPY WITH PROPOFOL;  Surgeon: Manya Silvas, MD;  Location: [facility] ENDOSCOPY;  Service: Endoscopy;  Laterality: N/A;  . ESOPHAGOGASTRODUODENOSCOPY (EGD) WITH PROPOFOL N/A 08/02/2015   Procedure: ESOPHAGOGASTRODUODENOSCOPY (EGD) WITH PROPOFOL;  Surgeon: Manya Silvas, MD;  Location: [facility] ENDOSCOPY;  Service: Endoscopy;  Laterality: N/A;    There were no vitals filed for this visit.  Subjective Assessment - 09/21/17 1256    Subjective  Patient reports 3/10 pain in the L shoulder today. Patient reports she has had minimal pain over the weekend and has been compliance with her HEP.     Pertinent History  Patient is 57 year old female that reports a fall resulting in a chair falling onto L  shoulder and wrist at the beginning of March. Patient reports that MD instructed her to wear shoulder sling as needed in community and follow up appointment Dr. Theresa Mulligan (3/27) instructed patient to wear L wrist brace for wrist sprain. Patient is scheduled for MRI 4/8 for shoulder and wrist. Patient reports worst pain in L shoulder 10/10 in past week, worst pain in L wrist 10/10. Patient reports the best her pain has been in the R shoulder in the past week: 8/10 wrist:  9/10    Limitations  House hold activities;Lifting;Writing;Sitting;Standing;Walking    How long can you sit comfortably?  Pain in all positions    How long can you stand comfortably?  Pain in all positions    How long can you walk comfortably?  Pain in all positions    Diagnostic tests  MRI scheduled 4/8    Patient Stated Goals  decrease pain    Pain Onset  1 to 4 weeks ago    Pain Onset  1 to 4 weeks ago       Ther-Ex -UBE forward 34mn backward 236m L2 -Attempted total gym pull ups, patient was unable to complete with proper form safely, attempted at multiple levels, so discontinued -TRX band pulls from slightly lean back with chair behind patient for safety 3x 10  -ER in 90/90 position with red tband and max TC for form which patient is able to maintain  with mod VC/TC following for proper form and proper muscle activation -Scaption 4# (cuff wt at lower arm) 3x 10 with min cuing for eccentric contrl  Manual -GHJ AP mobs grade III 30sec bouts 6 boutsin neutral; and 6 bouts with IR -STM withtrigger point releaseto L latissimus/teres minorand L bicep muscle belly in R sidelying with L UE overhead                          PT Education - 09/21/17 1308    Education provided  Yes    Education Details  Exercise form    Person(s) Educated  Patient    Methods  Explanation;Demonstration;Verbal cues    Comprehension  Verbal cues required;Returned demonstration;Verbalized understanding       PT Short  Term Goals - 09/10/17 1508      PT SHORT TERM GOAL #1   Title  Pt will be independent with HEP in order to improve strength and balance in order to decrease fall risk and improve function at home and work.    Time  2    Period  Weeks    Status  New        PT Long Term Goals - 09/10/17 1508      PT LONG TERM GOAL #1   Title  Patient will increase FOTO score to 56 to demonstrate predicted increase in functional mobility to complete ADLs    Baseline  5/29 62    Time  8    Period  Weeks    Status  Achieved      PT LONG TERM GOAL #2   Title  Pt will decrease worst pain as reported on NPRS by at least 3 points in order to demonstrate clinically significant reduction in pain.    Baseline  09/10/17 5/10 post fall    Time  8    Period  Weeks    Status  Achieved      PT LONG TERM GOAL #4   Title  Patient will demonstrate L grip strength of 15kg to return to PLOF in order to complete ADLs    Baseline  7/11: 12kg    Time  4    Period  Weeks    Status  On-going      PT LONG TERM GOAL #5   Title  Patient will demonstrate symmetrical shoulder strength in order to safely complete overhead ADLs    Baseline  7/11:  flex 4+/5 bilat; ext L 5/5 bilat; abd 4+/5 bilat ; IR 4+/5 bilat; ER L 5/5 R 4+/5    Time  4    Period  Weeks    Status  Partially Met      PT LONG TERM GOAL #6   Title  Patient will demonstrate symmetrical shoulder apleys IR/ER in order to complete ADLs    Baseline  7/11 IR: L: L1 R: T10  // ER C7 bilat    Time  4    Period  Weeks    Status  Partially Met            Plan - 09/21/17 1428    Clinical Impression Statement  PT continued to progress functional strengthening, which patient required cuing for, but was able to complete with proper form and noted muscle fatigue following.    Rehab Potential  Fair    Clinical Impairments Affecting Rehab Potential  (-) transportation, multiple comorbidities (DM II, obesity, GERD, communication barrier, sedentary lifestyle (+)  positive attitude,     PT Frequency  2x / week    PT Duration  8 weeks    PT Treatment/Interventions  Neuromuscular re-education;Passive range of motion;Manual techniques;Patient/family education;Taping;Therapeutic exercise;Therapeutic activities;Functional mobility training;Moist Heat;Ultrasound;Cryotherapy;Electrical Stimulation    PT Next Visit Plan  Supine IR stretch, tband ER, military press, shoulder scaption     PT Home Exercise Plan  Revised 5/29: standing rows with blue tband, ER with blue tband, IR with blue band [id number]    Consulted and Agree with Plan of Care  Patient       Patient will benefit from skilled therapeutic intervention in order to improve the following deficits and impairments:  Increased fascial restricitons, Pain, Improper body mechanics, Increased muscle spasms, Impaired tone, Postural dysfunction, Decreased activity tolerance, Decreased endurance, Decreased range of motion, Decreased strength, Impaired flexibility  Visit Diagnosis: Chronic left shoulder pain     Problem List Patient Active Problem List   Diagnosis Date Noted  . Lymphedema 01/17/2016  . Venous (peripheral) insufficiency 01/17/2016  . Pain in limb 01/17/2016  . Iron deficiency anemia due to chronic blood loss 01/09/2016  . History of endometrial cancer 08/01/2015  . Endometrial adenocarcinoma ([place]) 07/13/2015    Class: History of  . Inguinal lymphadenopathy 07/13/2015  . Long term current use of insulin ([place]) 12/12/2013  . Microalbuminuria 12/12/2013  . Diabetes ([place]) 07/28/2013  . BP (high blood pressure) 07/28/2013  . HLD (hyperlipidemia) 07/28/2013  . Adiposity 07/28/2013  . Obstructive apnea 07/28/2013  . Arthritis, degenerative 07/28/2013  . Apnea, sleep 07/28/2013  . Thyroid nodule 07/28/2013   [name] PT, DPT  [name] 09/21/2017, 2:57 PM  [facility] [address] [place],  [address] Phone: [phone]   Fax:  [phone]  Name: Christine Lawson MRN: [id number] Date of Birth: 19-Nov-1960

## 2017-09-21 NOTE — Patient Instructions (Signed)
Same HEP  But more ice ed of session  And do conrtrast

## 2017-09-23 ENCOUNTER — Encounter: Payer: Medicare Other | Admitting: Physical Therapy

## 2017-09-28 ENCOUNTER — Encounter: Payer: Medicare Other | Admitting: Occupational Therapy

## 2017-09-28 ENCOUNTER — Encounter: Payer: Self-pay | Admitting: Physical Therapy

## 2017-09-28 ENCOUNTER — Ambulatory Visit: Payer: Medicare Other | Admitting: Physical Therapy

## 2017-09-28 DIAGNOSIS — M25512 Pain in left shoulder: Secondary | ICD-10-CM | POA: Diagnosis not present

## 2017-09-28 DIAGNOSIS — G8929 Other chronic pain: Secondary | ICD-10-CM

## 2017-09-28 NOTE — Therapy (Signed)
[facility] [address], [place], [address] Phone: [phone]   Fax:  [phone]  Physical Therapy Treatment  Patient Details  Name: Christine Lawson MRN: [id number] Date of Birth: 20-Jan-1961 Referring Provider: Arvella Nigh   Encounter Date: 09/28/2017  PT End of Session - 09/28/17 1030    Visit Number  26    Number of Visits  33    Date for PT Re-Evaluation  10/09/17    PT Start Time  1030    PT Stop Time  1115    PT Time Calculation (min)  45 min    Activity Tolerance  Patient tolerated treatment well;Patient limited by pain    Behavior During Therapy  [facility] for tasks assessed/performed;Impulsive       Past Medical History:  Diagnosis Date  . Diabetes mellitus without complication ([place])   . Endometrial adenocarcinoma ([place])   . GERD (gastroesophageal reflux disease)   . Hematochezia   . Hyperlipidemia   . Hypertension   . IDA (iron deficiency anemia)   . Inguinal lymphadenopathy 07/13/2015  . Irritable bowel syndrome   . Obesity   . Osteoarthritis   . Sleep apnea     Past Surgical History:  Procedure Laterality Date  . ABDOMINAL HYSTERECTOMY  feb 2013  . COLONOSCOPY WITH PROPOFOL N/A 08/02/2015   Procedure: COLONOSCOPY WITH PROPOFOL;  Surgeon: Manya Silvas, MD;  Location: [facility] ENDOSCOPY;  Service: Endoscopy;  Laterality: N/A;  . ESOPHAGOGASTRODUODENOSCOPY (EGD) WITH PROPOFOL N/A 08/02/2015   Procedure: ESOPHAGOGASTRODUODENOSCOPY (EGD) WITH PROPOFOL;  Surgeon: Manya Silvas, MD;  Location: [facility] ENDOSCOPY;  Service: Endoscopy;  Laterality: N/A;    There were no vitals filed for this visit.  Subjective Assessment - 09/28/17 1037    Subjective  Patient reports she has some soreness in her L shouler following completing HEP over the weekend. Patient reports compliance with HEP with no questions or concerns.     Pertinent History  Patient is 57 year old female that reports a fall resulting in a  chair falling onto L shoulder and wrist at the beginning of March. Patient reports that MD instructed her to wear shoulder sling as needed in community and follow up appointment Dr. Theresa Mulligan (3/27) instructed patient to wear L wrist brace for wrist sprain. Patient is scheduled for MRI 4/8 for shoulder and wrist. Patient reports worst pain in L shoulder 10/10 in past week, worst pain in L wrist 10/10. Patient reports the best her pain has been in the R shoulder in the past week: 8/10 wrist:  9/10    Limitations  House hold activities;Lifting;Writing;Sitting;Standing;Walking    How long can you sit comfortably?  Pain in all positions    How long can you stand comfortably?  Pain in all positions    How long can you walk comfortably?  Pain in all positions    Diagnostic tests  MRI scheduled 4/8    Patient Stated Goals  decrease pain    Pain Onset  1 to 4 weeks ago    Pain Onset  1 to 4 weeks ago           Ther-Ex -UBE forward 72mn backward 263m L2 -Neutral grip rows 20# 3x 10 with min cuing for proper ROM -High rows 15# 3x10 with min cuing for proper posture -OMEGA military press 15# 3x 10 with min cuing for eccentric control -Standing bicep curl + overhead press 3x 10 with demo and max VC initially for proper  form -ER in 90/90 position with red tband 3x 10 with mod cuing to maintain proper elbow position -Scaption 4# (cuff wt at lower arm) 3x 10 with min cuing for eccentric control                      PT Education - 09/28/17 1031    Education provided  Yes    Education Details  Exercise form    Person(s) Educated  Patient    Methods  Explanation;Verbal cues    Comprehension  Verbal cues required;Verbalized understanding       PT Short Term Goals - 09/10/17 1508      PT SHORT TERM GOAL #1   Title  Pt will be independent with HEP in order to improve strength and balance in order to decrease fall risk and improve function at home and work.    Time  2    Period  Weeks     Status  New        PT Long Term Goals - 09/10/17 1508      PT LONG TERM GOAL #1   Title  Patient will increase FOTO score to 56 to demonstrate predicted increase in functional mobility to complete ADLs    Baseline  5/29 62    Time  8    Period  Weeks    Status  Achieved      PT LONG TERM GOAL #2   Title  Pt will decrease worst pain as reported on NPRS by at least 3 points in order to demonstrate clinically significant reduction in pain.    Baseline  09/10/17 5/10 post fall    Time  8    Period  Weeks    Status  Achieved      PT LONG TERM GOAL #4   Title  Patient will demonstrate L grip strength of 15kg to return to PLOF in order to complete ADLs    Baseline  7/11: 12kg    Time  4    Period  Weeks    Status  On-going      PT LONG TERM GOAL #5   Title  Patient will demonstrate symmetrical shoulder strength in order to safely complete overhead ADLs    Baseline  7/11:  flex 4+/5 bilat; ext L 5/5 bilat; abd 4+/5 bilat ; IR 4+/5 bilat; ER L 5/5 R 4+/5    Time  4    Period  Weeks    Status  Partially Met      PT LONG TERM GOAL #6   Title  Patient will demonstrate symmetrical shoulder apleys IR/ER in order to complete ADLs    Baseline  7/11 IR: L: L1 R: T10  // ER C7 bilat    Time  4    Period  Weeks    Status  Partially Met            Plan - 09/28/17 1113    Clinical Impression Statement  PT continued to progress resistive training which patient was able to complete with no excess pain. PT will reassess patient goals to determine if patient is ready for d/c next visit.     Rehab Potential  Fair    Clinical Impairments Affecting Rehab Potential  (-) transportation, multiple comorbidities (DM II, obesity, GERD, communication barrier, sedentary lifestyle (+) positive attitude,     PT Frequency  2x / week    PT Duration  8 weeks    PT Treatment/Interventions  Neuromuscular re-education;Passive range of motion;Manual techniques;Patient/family education;Taping;Therapeutic  exercise;Therapeutic activities;Functional mobility training;Moist Heat;Ultrasound;Cryotherapy;Electrical Stimulation    PT Next Visit Plan  REASSESS Supine IR stretch, tband ER, military press, shoulder scaption     PT Home Exercise Plan  Revised 5/29: standing rows with blue tband, ER with blue tband, IR with blue band [id number]    Consulted and Agree with Plan of Care  Patient       Patient will benefit from skilled therapeutic intervention in order to improve the following deficits and impairments:  Increased fascial restricitons, Pain, Improper body mechanics, Increased muscle spasms, Impaired tone, Postural dysfunction, Decreased activity tolerance, Decreased endurance, Decreased range of motion, Decreased strength, Impaired flexibility  Visit Diagnosis: Chronic left shoulder pain     Problem List Patient Active Problem List   Diagnosis Date Noted  . Lymphedema 01/17/2016  . Venous (peripheral) insufficiency 01/17/2016  . Pain in limb 01/17/2016  . Iron deficiency anemia due to chronic blood loss 01/09/2016  . History of endometrial cancer 08/01/2015  . Endometrial adenocarcinoma ([place]) 07/13/2015    Class: History of  . Inguinal lymphadenopathy 07/13/2015  . Long term current use of insulin ([place]) 12/12/2013  . Microalbuminuria 12/12/2013  . Diabetes ([place]) 07/28/2013  . BP (high blood pressure) 07/28/2013  . HLD (hyperlipidemia) 07/28/2013  . Adiposity 07/28/2013  . Obstructive apnea 07/28/2013  . Arthritis, degenerative 07/28/2013  . Apnea, sleep 07/28/2013  . Thyroid nodule 07/28/2013   [name] PT, DPT [name] 09/28/2017, 12:26 PM  [facility] [address] [place], [address] Phone: [phone]   Fax:  [phone]  Name: Christine Lawson MRN: [id number] Date of Birth: January 28, 1961

## 2017-09-29 ENCOUNTER — Ambulatory Visit: Payer: Medicare Other | Admitting: Occupational Therapy

## 2017-09-29 DIAGNOSIS — M25512 Pain in left shoulder: Secondary | ICD-10-CM | POA: Diagnosis not present

## 2017-09-29 DIAGNOSIS — M25532 Pain in left wrist: Secondary | ICD-10-CM

## 2017-09-29 DIAGNOSIS — M25632 Stiffness of left wrist, not elsewhere classified: Secondary | ICD-10-CM

## 2017-09-29 DIAGNOSIS — M6281 Muscle weakness (generalized): Secondary | ICD-10-CM

## 2017-09-29 NOTE — Patient Instructions (Signed)
Switch to prefab wrist immobilization splint   on at all times - except - AROM pain free 3 x day after heat  And then ice several time during day

## 2017-09-29 NOTE — Therapy (Signed)
[place] [facility] [address], [place], [address] Phone: [phone]   Fax:  [phone]  Occupational Therapy Treatment  Patient Details  Name: Christine Lawson MRN: [id number] Date of Birth: 04/26/1960 Referring Provider: Arvella Nigh   Encounter Date: 09/29/2017  OT End of Session - 09/29/17 1109    Visit Number  9    Number of Visits  12    Date for OT Re-Evaluation  10/12/17    OT Start Time  1010    OT Stop Time  1046    OT Time Calculation (min)  36 min    Activity Tolerance  Patient tolerated treatment well    Behavior During Therapy  [facility] for tasks assessed/performed;Impulsive       Past Medical History:  Diagnosis Date  . Diabetes mellitus without complication ([place])   . Endometrial adenocarcinoma ([place])   . GERD (gastroesophageal reflux disease)   . Hematochezia   . Hyperlipidemia   . Hypertension   . IDA (iron deficiency anemia)   . Inguinal lymphadenopathy 07/13/2015  . Irritable bowel syndrome   . Obesity   . Osteoarthritis   . Sleep apnea     Past Surgical History:  Procedure Laterality Date  . ABDOMINAL HYSTERECTOMY  feb 2013  . COLONOSCOPY WITH PROPOFOL N/A 08/02/2015   Procedure: COLONOSCOPY WITH PROPOFOL;  Surgeon: Manya Silvas, MD;  Location: [facility] ENDOSCOPY;  Service: Endoscopy;  Laterality: N/A;  . ESOPHAGOGASTRODUODENOSCOPY (EGD) WITH PROPOFOL N/A 08/02/2015   Procedure: ESOPHAGOGASTRODUODENOSCOPY (EGD) WITH PROPOFOL;  Surgeon: Manya Silvas, MD;  Location: [facility] ENDOSCOPY;  Service: Endoscopy;  Laterality: N/A;    There were no vitals filed for this visit.  Subjective Assessment - 09/29/17 1106    Subjective   Some pain with [name] doing that resistance to wrist - pain better - but can still increase to 6/10 - top of hand to wrist     Patient Stated Goals  I want to be able to use my hand like before - dressing , bathing , cutting food, lift and carry objects     Currently in  Pain?  Yes    Pain Score  4     Pain Location  Wrist    Pain Orientation  Left    Pain Descriptors / Indicators  Aching;Tender    Pain Type  Chronic pain;Acute pain    Pain Onset  More than a month ago         [facility] OT Assessment - 09/29/17 0001      AROM   Left Wrist Extension  60 Degrees    Left Wrist Flexion  90 Degrees       Assess AROM for wrist and done some gentle joint mobs to wrist - with gentle traction -discomfort in all planes but worse to ulnar side          OT Treatments/Exercises (OP) - 09/29/17 0001      Ultrasound   Ultrasound Location  dorsal wrist and hand     Ultrasound Parameters  3.3MHZ, 20%, 0.8 inttensity     Ultrasound Goals  Edema;Pain      LUE Fluidotherapy   Number Minutes Fluidotherapy  8 Minutes    LUE Fluidotherapy Location  Hand;Wrist    Comments  Prior to soft itssue - pain did decrease with AROM for wrist        Soft tissue mobs to dorsal and volar wrist and forearm -graston tool nr 2 sweeping  And in hand MC spreads and carpal spreads and webspace  - joint mobs to [place] for wrist in all planes   Guard in extention some  Pt to hold off on isometric HEP     Switch to prefab wrist immobilization splint   on at all times - except - AROM pain free 3 x day after heat  And then ice several time during day     OT Education - 09/29/17 1108    Education provided  Yes    Education Details  switch to immobilization splint for wrist -and HEP pain free AROM still and ice     Person(s) Educated  Patient;Other (comment)    Methods  Explanation;Demonstration    Comprehension  Returned demonstration       OT Short Term Goals - 09/14/17 1617      OT SHORT TERM GOAL #1   Title  Pain on L wrist decrease to less than 5/10 at rest to wean out of splint     Baseline  splint more than 50% during day ,  night time - and pain between 7-10/10 pe rpt     Time  3    Period  Weeks    Status  On-going    Target Date  10/05/17      OT  SHORT TERM GOAL #2   Title  Pt to be ind in HEP to decrease pain , increase AROM in wrist in all planes and increase grip     Baseline  increase symptoms since fall - reed on HEP to decrease pain and increase ROM     Time  2    Period  Weeks    Status  On-going    Target Date  09/28/17        OT Long Term Goals - 09/14/17 1618      OT LONG TERM GOAL #1   Title  Pt wrist AROM and strength increase for pt to use L hand in more than 50% of bathing , cut food, turn doorknob, without increase symptoms     Baseline  doing bathing and dressing - but not turning doorknob and cutting food     Time  4    Period  Weeks    Status  On-going    Target Date  10/12/17      OT LONG TERM GOAL #2   Title  L grip  increase with more than 5 lbs and prehension strength increase with more than 3 lbs to use hand in cooking , laudry and cut food     Baseline  L grip 20 , R 35, - grip decrease with 10 lbs since fall     Time  4    Period  Weeks    Status  On-going    Target Date  10/12/17            Plan - 09/29/17 1110    Clinical Impression Statement  Pt cont to have dorsal wrist pain and edema- pain between 4-6 /10 pain -since fall on 4th July - did improve with her AROM at wrist - but pt to switch to prefab wrist immobilization splint inbetween AROM HEP - and ice - to see if pain can increase but maintain ROM - pt could not get neoprene splint tight enought for support at wrist     Occupational performance deficits (Please refer to evaluation for details):  ADL's;IADL's;Play;Leisure    Rehab Potential  Fair    Current Impairments/barriers affecting progress:  co-morbitities     OT Frequency  1x / week    OT Duration  4 weeks    OT Treatment/Interventions  Self-care/ADL training;Iontophoresis;Therapeutic exercise;Ultrasound;Contrast Bath;Fluidtherapy;Manual Therapy;Splinting;Patient/family education    Plan  reassess and upgrade HEP as needed -    Clinical Decision Making  Multiple treatment  options, significant modification of task necessary    OT Home Exercise Plan  See pt instruction    Consulted and Agree with Plan of Care  Patient       Patient will benefit from skilled therapeutic intervention in order to improve the following deficits and impairments:  Pain, Impaired flexibility, Increased edema, Decreased strength, Decreased range of motion, Impaired UE functional use  Visit Diagnosis: Pain in left wrist  Stiffness of left wrist, not elsewhere classified  Muscle weakness (generalized)    Problem List Patient Active Problem List   Diagnosis Date Noted  . Lymphedema 01/17/2016  . Venous (peripheral) insufficiency 01/17/2016  . Pain in limb 01/17/2016  . Iron deficiency anemia due to chronic blood loss 01/09/2016  . History of endometrial cancer 08/01/2015  . Endometrial adenocarcinoma ([place]) 07/13/2015    Class: History of  . Inguinal lymphadenopathy 07/13/2015  . Long term current use of insulin ([place]) 12/12/2013  . Microalbuminuria 12/12/2013  . Diabetes ([place]) 07/28/2013  . BP (high blood pressure) 07/28/2013  . HLD (hyperlipidemia) 07/28/2013  . Adiposity 07/28/2013  . Obstructive apnea 07/28/2013  . Arthritis, degenerative 07/28/2013  . Apnea, sleep 07/28/2013  . Thyroid nodule 07/28/2013    Rosalyn Gess OTR/l,CLT 09/29/2017, 11:13 AM  [place] [facility] [address] [place], [address] Phone: [phone]   Fax:  [phone]  Name: Christine Lawson MRN: [id number] Date of Birth: 08-09-1960

## 2017-09-30 ENCOUNTER — Encounter: Payer: Medicare Other | Admitting: Physical Therapy

## 2017-10-02 ENCOUNTER — Encounter: Payer: Self-pay | Admitting: Physical Therapy

## 2017-10-02 ENCOUNTER — Ambulatory Visit: Payer: Medicare Other | Attending: Internal Medicine | Admitting: Physical Therapy

## 2017-10-02 DIAGNOSIS — M25562 Pain in left knee: Secondary | ICD-10-CM | POA: Diagnosis present

## 2017-10-02 DIAGNOSIS — M25512 Pain in left shoulder: Secondary | ICD-10-CM | POA: Insufficient documentation

## 2017-10-02 DIAGNOSIS — M25632 Stiffness of left wrist, not elsewhere classified: Secondary | ICD-10-CM | POA: Insufficient documentation

## 2017-10-02 DIAGNOSIS — M6281 Muscle weakness (generalized): Secondary | ICD-10-CM | POA: Diagnosis present

## 2017-10-02 DIAGNOSIS — M25561 Pain in right knee: Secondary | ICD-10-CM | POA: Insufficient documentation

## 2017-10-02 DIAGNOSIS — M25532 Pain in left wrist: Secondary | ICD-10-CM | POA: Diagnosis present

## 2017-10-02 DIAGNOSIS — G8929 Other chronic pain: Secondary | ICD-10-CM

## 2017-10-02 NOTE — Therapy (Signed)
[place] [facility] [address] [place], [address] Phone: [phone]   Fax:  [phone]  Physical Therapy Treatment/DC Summary  Patient Details  Name: Christine Lawson MRN: [id number] Date of Birth: [birth year] Referring Provider: Arvella Nigh   Encounter Date: 10/02/2017  PT End of Session - 10/02/17 1034    Visit Number  27    Number of Visits  33    Date for PT Re-Evaluation  10/09/17    PT Start Time  1017    PT Stop Time  1100    PT Time Calculation (min)  43 min    Activity Tolerance  Patient tolerated treatment well;Patient limited by pain    Behavior During Therapy  [facility] for tasks assessed/performed;Impulsive       Past Medical History:  Diagnosis Date  . Diabetes mellitus without complication ([place])   . Endometrial adenocarcinoma ([place])   . GERD (gastroesophageal reflux disease)   . Hematochezia   . Hyperlipidemia   . Hypertension   . IDA (iron deficiency anemia)   . Inguinal lymphadenopathy 07/13/2015  . Irritable bowel syndrome   . Obesity   . Osteoarthritis   . Sleep apnea     Past Surgical History:  Procedure Laterality Date  . ABDOMINAL HYSTERECTOMY  feb 2013  . COLONOSCOPY WITH PROPOFOL N/A 08/02/2015   Procedure: COLONOSCOPY WITH PROPOFOL;  Surgeon: Manya Silvas, MD;  Location: [facility] ENDOSCOPY;  Service: Endoscopy;  Laterality: N/A;  . ESOPHAGOGASTRODUODENOSCOPY (EGD) WITH PROPOFOL N/A 08/02/2015   Procedure: ESOPHAGOGASTRODUODENOSCOPY (EGD) WITH PROPOFOL;  Surgeon: Manya Silvas, MD;  Location: [facility] ENDOSCOPY;  Service: Endoscopy;  Laterality: N/A;    There were no vitals filed for this visit.  Subjective Assessment - 10/02/17 1056    Subjective  Patient reports minimal pain today, reporting that she agrees that she is able to continue strengthening on her own at this time.     Pertinent History  Patient is 57 year old female that reports a fall resulting in a chair falling onto L  shoulder and wrist at the beginning of March. Patient reports that MD instructed her to wear shoulder sling as needed in community and follow up appointment Dr. Theresa Mulligan (3/27) instructed patient to wear L wrist brace for wrist sprain. Patient is scheduled for MRI 4/8 for shoulder and wrist. Patient reports worst pain in L shoulder 10/10 in past week, worst pain in L wrist 10/10. Patient reports the best her pain has been in the R shoulder in the past week: 8/10 wrist:  9/10    Limitations  House hold activities;Lifting;Writing;Sitting;Standing;Walking    How long can you sit comfortably?  Pain in all positions    How long can you stand comfortably?  Pain in all positions    How long can you walk comfortably?  Pain in all positions    Diagnostic tests  MRI scheduled 4/8    Patient Stated Goals  decrease pain    Pain Onset  More than a month ago    Pain Onset  1 to 4 weeks ago       Ther-Ex - UBE L2 forward 2 min backwards 2 min -ROM and strength assessment with patient demonstrating symmetrical ROM and strength with minimal pain with testing - HEP review (2 set demonstration of each) of the following: -Standing rows blue tband 2 x10 with min cuing for full scapular squeeze -Standing high rows blue tband 2 x10 -Standing IR blue tband 2 x10 with  min cuing for eccentric control -Standing ER blue tband 2 x10  -Standing bilat shoulder  scaption 2# 2 x10 with cuing for posture -Standing bilat shoulder abd 2# 2 x10 -Doorway pec stretch 45sec hold with cuing for proper posture with good carry over -Bicep wall stretch bilat 45sec hold - Latissimus stretch 45sec hold With education provided on frequency, duration, intensity (how to increase/decrease), and purpose of all therex. PT educated patient on the importance of continuing HEP to continue to maintain progress                         PT Education - 10/02/17 1035    Education provided  Yes    Education Details  HEP  modification; DC recommendations    Person(s) Educated  Patient    Methods  Explanation;Demonstration;Tactile cues;Verbal cues;Handout    Comprehension  Verbalized understanding;Returned demonstration;Verbal cues required;Tactile cues required       PT Short Term Goals - 09/10/17 1508      PT SHORT TERM GOAL #1   Title  Pt will be independent with HEP in order to improve strength and balance in order to decrease fall risk and improve function at home and work.    Time  2    Period  Weeks    Status  New        PT Long Term Goals - 10/02/17 1027      PT LONG TERM GOAL #1   Title  Patient will increase FOTO score to 56 to demonstrate predicted increase in functional mobility to complete ADLs    Baseline  5/29 62    Time  8    Period  Weeks    Status  Achieved      PT LONG TERM GOAL #2   Title  Pt will decrease worst pain as reported on NPRS by at least 3 points in order to demonstrate clinically significant reduction in pain.    Baseline  09/10/17 3/10     Time  8    Period  Weeks    Status  Achieved      PT LONG TERM GOAL #3   Title  Pt will be able to open a jar to each side successfully without pain in order to complete ADLs    Baseline  8/2 is able 50% of the time    Time  8    Period  Weeks    Status  Partially Met      PT LONG TERM GOAL #4   Title  Patient will demonstrate L grip strength of 15kg to return to PLOF in order to complete ADLs    Baseline  7/11: 12kg    Time  4    Period  Weeks    Status  On-going      PT LONG TERM GOAL #5   Title  Patient will demonstrate symmetrical shoulder strength in order to safely complete overhead ADLs    Baseline  10/02/17  flex 5/5 bilat; ext L 5/5 bilat; abd 4+/5 bilat ; IR 4+/5 bilat; L 5/5 bilat    Time  4    Period  Weeks    Status  Partially Met      PT LONG TERM GOAL #6   Title  Patient will demonstrate symmetrical shoulder apleys IR/ER in order to complete ADLs    Baseline  10/02/17 L1 bilat // ER C7 bilat    Time   4  Period  Weeks    Status  Partially Met            Plan - 10/02/17 1036    Clinical Impression Statement  PT has met all goals at this time, and is able to DC to robust HEP strengthening progam. Patient was able to demonstrate all HEP exercises with accuracy following PT cuing and verbalized understanding of all education provided on therex parameters. Patient is given PT clinic contact info should she have any further questions/concerns following DC PT for UE.     Rehab Potential  Fair    Clinical Impairments Affecting Rehab Potential  (-) transportation, multiple comorbidities (DM II, obesity, GERD, communication barrier, sedentary lifestyle (+) positive attitude,     PT Frequency  2x / week    PT Duration  8 weeks    PT Treatment/Interventions  Neuromuscular re-education;Passive range of motion;Manual techniques;Patient/family education;Taping;Therapeutic exercise;Therapeutic activities;Functional mobility training;Moist Heat;Ultrasound;Cryotherapy;Electrical Stimulation    PT Next Visit Plan  DC PT    PT Home Exercise Plan  10/02/17: see DC summary [id number]       Patient will benefit from skilled therapeutic intervention in order to improve the following deficits and impairments:  Increased fascial restricitons, Pain, Improper body mechanics, Increased muscle spasms, Impaired tone, Postural dysfunction, Decreased activity tolerance, Decreased endurance, Decreased range of motion, Decreased strength, Impaired flexibility  Visit Diagnosis: Chronic left shoulder pain  Muscle weakness (generalized)     Problem List Patient Active Problem List   Diagnosis Date Noted  . Lymphedema 01/17/2016  . Venous (peripheral) insufficiency 01/17/2016  . Pain in limb 01/17/2016  . Iron deficiency anemia due to chronic blood loss 01/09/2016  . History of endometrial cancer 08/01/2015  . Endometrial adenocarcinoma ([place]) 07/13/2015    Class: History of  . Inguinal lymphadenopathy  07/13/2015  . Long term current use of insulin ([place]) 12/12/2013  . Microalbuminuria 12/12/2013  . Diabetes ([place]) 07/28/2013  . BP (high blood pressure) 07/28/2013  . HLD (hyperlipidemia) 07/28/2013  . Adiposity 07/28/2013  . Obstructive apnea 07/28/2013  . Arthritis, degenerative 07/28/2013  . Apnea, sleep 07/28/2013  . Thyroid nodule 07/28/2013   [name] PT, DPT [name] 10/02/2017, 10:58 AM  [place] [facility] [address], [place], [address] Phone: [phone]   Fax:  [phone]  Name: Alveena Taira MRN: [id number] Date of Birth: 1960-06-09

## 2017-10-05 ENCOUNTER — Encounter: Payer: Medicare Other | Admitting: Physical Therapy

## 2017-10-07 ENCOUNTER — Encounter: Payer: Self-pay | Admitting: Physical Therapy

## 2017-10-07 ENCOUNTER — Ambulatory Visit: Payer: Medicare Other | Admitting: Physical Therapy

## 2017-10-07 ENCOUNTER — Ambulatory Visit: Payer: Medicare Other | Admitting: Occupational Therapy

## 2017-10-07 DIAGNOSIS — M25632 Stiffness of left wrist, not elsewhere classified: Secondary | ICD-10-CM

## 2017-10-07 DIAGNOSIS — G8929 Other chronic pain: Secondary | ICD-10-CM

## 2017-10-07 DIAGNOSIS — M6281 Muscle weakness (generalized): Secondary | ICD-10-CM

## 2017-10-07 DIAGNOSIS — M25512 Pain in left shoulder: Secondary | ICD-10-CM | POA: Diagnosis not present

## 2017-10-07 DIAGNOSIS — M25532 Pain in left wrist: Secondary | ICD-10-CM

## 2017-10-07 DIAGNOSIS — M25562 Pain in left knee: Principal | ICD-10-CM

## 2017-10-07 DIAGNOSIS — M25561 Pain in right knee: Secondary | ICD-10-CM

## 2017-10-07 NOTE — Therapy (Signed)
[place] [facility] [address] [place], [address] Phone: [phone]   Fax:  [phone]  Occupational Therapy Treatment  Patient Details  Name: Christine Lawson MRN: [id number] Date of Birth: 05/30/1960 Referring Provider: Arvella Nigh   Encounter Date: 10/07/2017  OT End of Session - 10/07/17 1029    Visit Number  10    Number of Visits  12    Date for OT Re-Evaluation  10/12/17    OT Start Time  6962    OT Stop Time  1044    OT Time Calculation (min)  42 min    Activity Tolerance  Patient tolerated treatment well    Behavior During Therapy  [facility] for tasks assessed/performed;Impulsive       Past Medical History:  Diagnosis Date  . Diabetes mellitus without complication ([place])   . Endometrial adenocarcinoma ([place])   . GERD (gastroesophageal reflux disease)   . Hematochezia   . Hyperlipidemia   . Hypertension   . IDA (iron deficiency anemia)   . Inguinal lymphadenopathy 07/13/2015  . Irritable bowel syndrome   . Obesity   . Osteoarthritis   . Sleep apnea     Past Surgical History:  Procedure Laterality Date  . ABDOMINAL HYSTERECTOMY  feb 2013  . COLONOSCOPY WITH PROPOFOL N/A 08/02/2015   Procedure: COLONOSCOPY WITH PROPOFOL;  Surgeon: Manya Silvas, MD;  Location: [facility] ENDOSCOPY;  Service: Endoscopy;  Laterality: N/A;  . ESOPHAGOGASTRODUODENOSCOPY (EGD) WITH PROPOFOL N/A 08/02/2015   Procedure: ESOPHAGOGASTRODUODENOSCOPY (EGD) WITH PROPOFOL;  Surgeon: Manya Silvas, MD;  Location: [facility] ENDOSCOPY;  Service: Endoscopy;  Laterality: N/A;    There were no vitals filed for this visit.  Subjective Assessment - 10/07/17 1026    Subjective   Pain still the same - about 4/10 at the wrist - did wear the harder splint - about 50% of time - did see the Dr - he said if not better in few wks will order open MRI for wrist and shoulder     Patient Stated Goals  I want to be able to use my hand like before - dressing ,  bathing , cutting food, lift and carry objects     Currently in Pain?  Yes    Pain Score  4     Pain Location  Wrist    Pain Orientation  Left    Pain Descriptors / Indicators  Aching    Pain Type  Chronic pain    Pain Onset  More than a month ago    Pain Frequency  Constant         Assess AROM for wrist and done some gentle joint mobs to wrist - with gentle traction -discomfort more in place where hand pressure is apply            skin check done and pt ed on what to expect   OT Treatments/Exercises (OP) - 10/07/17 0001      Iontophoresis   Type of Iontophoresis  Dexamethasone    Location  dorsal wrist     Dose  med patch     Time  55min      removed and skin check done afterwards        OT Education - 10/07/17 1029    Education provided  Yes    Education Details  ed on use of ionto - and to wear harder wrist splint more - to immobilize wrist     Person(s) Educated  Patient    Methods  Explanation;Demonstration    Comprehension  Verbalized understanding;Returned demonstration       OT Short Term Goals - 09/14/17 1617      OT SHORT TERM GOAL #1   Title  Pain on L wrist decrease to less than 5/10 at rest to wean out of splint     Baseline  splint more than 50% during day ,  night time - and pain between 7-10/10 pe rpt     Time  3    Period  Weeks    Status  On-going    Target Date  10/05/17      OT SHORT TERM GOAL #2   Title  Pt to be ind in HEP to decrease pain , increase AROM in wrist in all planes and increase grip     Baseline  increase symptoms since fall - reed on HEP to decrease pain and increase ROM     Time  2    Period  Weeks    Status  On-going    Target Date  09/28/17        OT Long Term Goals - 09/14/17 1618      OT LONG TERM GOAL #1   Title  Pt wrist AROM and strength increase for pt to use L hand in more than 50% of bathing , cut food, turn doorknob, without increase symptoms     Baseline  doing bathing and dressing - but not  turning doorknob and cutting food     Time  4    Period  Weeks    Status  On-going    Target Date  10/12/17      OT LONG TERM GOAL #2   Title  L grip  increase with more than 5 lbs and prehension strength increase with more than 3 lbs to use hand in cooking , laudry and cut food     Baseline  L grip 20 , R 35, - grip decrease with 10 lbs since fall     Time  4    Period  Weeks    Status  On-going    Target Date  10/12/17            Plan - 10/07/17 1030    Clinical Impression Statement  Pt cont to have pain and edema ovdorsal wrist - 4 /10 pain still - pt did report she is wearing splint hard one only about 50% of time - pt did see PA yesterday - and if not improving in the next 4-6wks - will order MRI - did initiated this date ionto with dexamethazone     Occupational performance deficits (Please refer to evaluation for details):  ADL's;IADL's;Play;Leisure    Rehab Potential  Fair    Current Impairments/barriers affecting progress:  co-morbitities     OT Frequency  1x / week    OT Duration  2 weeks    OT Treatment/Interventions  Self-care/ADL training;Iontophoresis;Therapeutic exercise;Ultrasound;Contrast Bath;Fluidtherapy;Manual Therapy;Splinting;Patient/family education    Plan  reassess pain - ROM and splint wearing     Clinical Decision Making  Multiple treatment options, significant modification of task necessary    OT Home Exercise Plan  See pt instruction    Consulted and Agree with Plan of Care  Patient       Patient will benefit from skilled therapeutic intervention in order to improve the following deficits and impairments:  Pain, Impaired flexibility, Increased edema, Decreased strength, Decreased range of motion, Impaired UE  functional use  Visit Diagnosis: Muscle weakness (generalized)  Pain in left wrist  Stiffness of left wrist, not elsewhere classified    Problem List Patient Active Problem List   Diagnosis Date Noted  . Lymphedema 01/17/2016  .  Venous (peripheral) insufficiency 01/17/2016  . Pain in limb 01/17/2016  . Iron deficiency anemia due to chronic blood loss 01/09/2016  . History of endometrial cancer 08/01/2015  . Endometrial adenocarcinoma ([place]) 07/13/2015    Class: History of  . Inguinal lymphadenopathy 07/13/2015  . Long term current use of insulin ([place]) 12/12/2013  . Microalbuminuria 12/12/2013  . Diabetes ([place]) 07/28/2013  . BP (high blood pressure) 07/28/2013  . HLD (hyperlipidemia) 07/28/2013  . Adiposity 07/28/2013  . Obstructive apnea 07/28/2013  . Arthritis, degenerative 07/28/2013  . Apnea, sleep 07/28/2013  . Thyroid nodule 07/28/2013    Rosalyn Gess OTR/L, CLT 10/07/2017, 1:06 PM  [place] [facility] [address], [place], [address] Phone: [phone]   Fax:  [phone]  Name: Sherie Dobrowolski MRN: [id number] Date of Birth: 30-Mar-1960

## 2017-10-07 NOTE — Therapy (Signed)
[place] [facility] [address], [place], [address] Phone: [phone]   Fax:  [phone]  Physical Therapy Treatment  Patient Details  Name: Christine Lawson MRN: [id number] Date of Birth: [birth year] Referring Provider: Arvella Nigh   Encounter Date: 10/07/2017  PT End of Session - 10/07/17 0930    Visit Number  1    Number of Visits  17    Date for PT Re-Evaluation  12/02/17    PT Start Time  0915    PT Stop Time  1015    PT Time Calculation (min)  60 min    Activity Tolerance  Patient tolerated treatment well;Patient limited by pain    Behavior During Therapy  [facility] for tasks assessed/performed;Impulsive       Past Medical History:  Diagnosis Date  . Diabetes mellitus without complication ([place])   . Endometrial adenocarcinoma ([place])   . GERD (gastroesophageal reflux disease)   . Hematochezia   . Hyperlipidemia   . Hypertension   . IDA (iron deficiency anemia)   . Inguinal lymphadenopathy 07/13/2015  . Irritable bowel syndrome   . Obesity   . Osteoarthritis   . Sleep apnea     Past Surgical History:  Procedure Laterality Date  . ABDOMINAL HYSTERECTOMY  feb 2013  . COLONOSCOPY WITH PROPOFOL N/A 08/02/2015   Procedure: COLONOSCOPY WITH PROPOFOL;  Surgeon: Manya Silvas, MD;  Location: [facility] ENDOSCOPY;  Service: Endoscopy;  Laterality: N/A;  . ESOPHAGOGASTRODUODENOSCOPY (EGD) WITH PROPOFOL N/A 08/02/2015   Procedure: ESOPHAGOGASTRODUODENOSCOPY (EGD) WITH PROPOFOL;  Surgeon: Manya Silvas, MD;  Location: [facility] ENDOSCOPY;  Service: Endoscopy;  Laterality: N/A;    There were no vitals filed for this visit.   Subjective Assessment - 10/07/17 0922    Pertinent History  Patient is a 57 year old female returning to this clinic for bilat knee pain OA. Patient reports bilat knee pain R>L over the past 3-4 years. Patient reports pain is worse in the morning, and better when she starts moving around. patient reports  worst pain in the past week 10/10 and best 0/10 with legs propped up. Pt denies N/V, unexplained weight fluctuation, saddle paresthesia,B&B, fever, night sweats, or unrelenting night pain at this time.    Limitations  Lifting;Standing;Walking;House hold activities    How long can you stand comfortably?  65min or less    How long can you walk comfortably?  29min or less    Diagnostic tests  None to date    Patient Stated Goals  Be able to walk faster and longer    Currently in Pain?  Yes    Pain Location  Knee    Pain Orientation  Right;Left    Pain Descriptors / Indicators  Aching;Nagging    Pain Type  Chronic pain    Pain Onset  Other (comment)    Pain Frequency  Constant    Aggravating Factors   Standing and walking,     Pain Relieving Factors  rest    Effect of Pain on Daily Activities  Unable to complete community ambulation errands       ROM All lbilat knee and ankle motion wnl Hip motions restricted by soft tissue in hip flex and FADIR; hip IR severely limited  Strength Hip flex: 4/5 bilat  Hip abd L:4-/5 bilat Hip add 3/5 bilat Hip Ext in prone: 3/5 bilat Hip IR: 4/5 bilat Hip ER: 3+/5 bilat Knee flex 4+/5 Knee ext 3+/5 bilat with pain Ankle DF  4/5 bilat Ankle PF 3 full bilat raises   Special Tests/ Other (+) Elys on L for hip flexor tightness (-) 90/90 bilat (-) FABER on L (-) FADIR on L 5xSTS: 17sec 6MWT: 1032ft ambulates with bilat hip ER and foot eversion with little push off bilat SLS: unable   Ther-Ex -Heel raise exercise with bar support for balance x 10  - Bridge x10 with cuing for proper form with glute contraction - SLR x 10 with cuing for eccentric control - Supine clamshell red tband x10 with cuing for proper form with eccentric control     [facility] PT Assessment - 10/07/17 0001      Assessment   Medical Diagnosis  acute L shoulder and wrist sprain    Onset Date/Surgical Date  09/02/13    Hand Dominance  Right    Next MD Visit  -- MRI 4/8     Prior Therapy  Yes; successful      Precautions   Precautions  -- Wear shoulder sling and wrist brace in the [place] residence    Living Arrangements  -- with twin sister    Type of Home  Apartment    Home Access  Level entry      Prior Function   Level of Independence  Independent    Vocation  -- Disability    Leisure  -- cooking, cleaning, walking for exercise                                 PT Education - 10/07/17 [phone]    Education provided  Yes    Education Details  Patient was educated on diagnosis, anatomy and pathology involved, prognosis, role of PT, and was given an HEP, demonstrating exercise with proper form following verbal and tactile cues, and was given a paper hand out to continue exercise at home. Pt was educated on and agreed to plan of care.    Person(s) Educated  Patient    Methods  Explanation;Demonstration;Tactile cues;Handout;Verbal cues    Comprehension  Verbalized understanding;Returned demonstration;Verbal cues required;Tactile cues required;Need further instruction       PT Short Term Goals - 10/07/17 0942      PT SHORT TERM GOAL #1   Title  Pt will be independent with HEP in order to improve strength and balance in order to decrease fall risk and improve function at home      Time  4    Period  Weeks    Status  New        PT Long Term Goals - 10/07/17 0943      PT LONG TERM GOAL #1   Title  Pt will increase 6MWT by at least 28m (149ft) in order to demonstrate clinically significant improvement in muscular endurance and community ambulation    Baseline  10/07/17 1083ft    Time  8    Period  Weeks    Status  New      PT LONG TERM GOAL #2   Title  Pt will decrease 5TSTS by at least 3 seconds in order to demonstrate clinically significant improvement in LE strength    Baseline  10/07/17 17sec    Time  8    Period  Weeks    Status  New      PT LONG TERM GOAL #3   Title  Patient will increase FOTO score to  56 to demonstrate predicted increase in functional mobility to complete ADLs    Baseline  10/07/17 51    Time  8    Period  Weeks    Status  New            Plan - 10/07/17 0957    Clinical Impression Statement  Pt is a 65 t/o female presenting with bilat knee pain with hx of bilat knee OA, and general deconditioning. Patient with impairments in LE ROM, LE strength, pain, balance abnormal gait and decreased endurance. 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 Patient will benefit from skilled PT services to return to safe, independent function.     History and Personal Factors relevant to plan of care:  bilat knee OA    Clinical Presentation  Evolving    Clinical Presentation due to:  2 personal factors/comorbidities, 3 body systems/activity limitations/participation restrictions     Clinical Decision Making  Moderate    Rehab Potential  Fair    Clinical Impairments Affecting Rehab Potential  (-) transportation, multiple comorbidities (DM II, obesity, GERD, communication barrier, sedentary lifestyle (+) positive attitude,     PT Frequency  2x / week    PT Duration  8 weeks    PT Treatment/Interventions  Neuromuscular re-education;Passive range of motion;Manual techniques;Patient/family education;Taping;Therapeutic exercise;Therapeutic activities;Functional mobility training;Moist Heat;Ultrasound;Cryotherapy;Electrical Stimulation;Dry needling;Balance training;Stair training;Gait training;ADLs/Self Care Home Management    PT Next Visit Plan  HEP and goal review; BLE strengthening    PT Home Exercise Plan  eval: bridge, supine clamshell, standing heel raises, SLR    Consulted and Agree with Plan of Care  Patient       Patient will benefit from skilled therapeutic intervention in order to improve the  following deficits and impairments:  Abnormal gait, Pain, Improper body mechanics, Postural dysfunction, Decreased activity tolerance, Decreased endurance, Decreased range of motion, Decreased strength, Decreased balance, Difficulty walking, Impaired flexibility  Visit Diagnosis: Chronic pain of left knee  Chronic pain of right knee     Problem List Patient Active Problem List   Diagnosis Date Noted  . Lymphedema 01/17/2016  . Venous (peripheral) insufficiency 01/17/2016  . Pain in limb 01/17/2016  . Iron deficiency anemia due to chronic blood loss 01/09/2016  . History of endometrial cancer 08/01/2015  . Endometrial adenocarcinoma ([place]) 07/13/2015    Class: History of  . Inguinal lymphadenopathy 07/13/2015  . Long term current use of insulin ([place]) 12/12/2013  . Microalbuminuria 12/12/2013  . Diabetes ([place]) 07/28/2013  . BP (high blood pressure) 07/28/2013  . HLD (hyperlipidemia) 07/28/2013  . Adiposity 07/28/2013  . Obstructive apnea 07/28/2013  . Arthritis, degenerative 07/28/2013  . Apnea, sleep 07/28/2013  . Thyroid nodule 07/28/2013   [name] PT, DPT [name] 10/07/2017, 10:10 AM  [place] [facility] [address] [place], [address] Phone: [phone]   Fax:  [phone]  Name: Christine Lawson MRN: [id number] Date of Birth: August 30, 1960

## 2017-10-07 NOTE — Patient Instructions (Signed)
Wear wrist splint more than 75% , cont AROM pain free  and splint off only for ADL's  And do contrast

## 2017-10-12 ENCOUNTER — Encounter: Payer: Self-pay | Admitting: Physical Therapy

## 2017-10-12 ENCOUNTER — Ambulatory Visit: Payer: Medicare Other | Admitting: Occupational Therapy

## 2017-10-12 ENCOUNTER — Ambulatory Visit: Payer: Medicare Other | Admitting: Physical Therapy

## 2017-10-12 DIAGNOSIS — M25532 Pain in left wrist: Secondary | ICD-10-CM

## 2017-10-12 DIAGNOSIS — M25632 Stiffness of left wrist, not elsewhere classified: Secondary | ICD-10-CM

## 2017-10-12 DIAGNOSIS — M6281 Muscle weakness (generalized): Secondary | ICD-10-CM

## 2017-10-12 DIAGNOSIS — M25512 Pain in left shoulder: Secondary | ICD-10-CM | POA: Diagnosis not present

## 2017-10-12 NOTE — Patient Instructions (Signed)
Same

## 2017-10-12 NOTE — Therapy (Signed)
[place] [facility] [address] [place], [address] Phone: [phone]   Fax:  [phone]  Physical Therapy Treatment  Patient Details  Name: Christine Lawson MRN: [id number] Date of Birth: Feb 25, 1961 Referring Provider: Arvella Nigh   Encounter Date: 10/12/2017  PT End of Session - 10/12/17 1447    Visit Number  2    Number of Visits  17    Date for PT Re-Evaluation  12/02/17    PT Start Time  0240    PT Stop Time  0320    PT Time Calculation (min)  40 min    Activity Tolerance  Patient tolerated treatment well;Patient limited by pain    Behavior During Therapy  [facility] for tasks assessed/performed;Impulsive       Past Medical History:  Diagnosis Date  . Diabetes mellitus without complication ([place])   . Endometrial adenocarcinoma ([place])   . GERD (gastroesophageal reflux disease)   . Hematochezia   . Hyperlipidemia   . Hypertension   . IDA (iron deficiency anemia)   . Inguinal lymphadenopathy 07/13/2015  . Irritable bowel syndrome   . Obesity   . Osteoarthritis   . Sleep apnea     Past Surgical History:  Procedure Laterality Date  . ABDOMINAL HYSTERECTOMY  feb 2013  . COLONOSCOPY WITH PROPOFOL N/A 08/02/2015   Procedure: COLONOSCOPY WITH PROPOFOL;  Surgeon: Manya Silvas, MD;  Location[facility] ENDOSCOPY;  Service: Endoscopy;  Laterality: N/A;  . ESOPHAGOGASTRODUODENOSCOPY (EGD) WITH PROPOFOL N/A 08/02/2015   Procedure: ESOPHAGOGASTRODUODENOSCOPY (EGD) WITH PROPOFOL;  Surgeon: Manya Silvas, MD;  Location: [facility] ENDOSCOPY;  Service: Endoscopy;  Laterality: N/A;    There were no vitals filed for this visit.  Subjective Assessment - 10/12/17 1444    Subjective  Patient reports 9/10 pain in R and 8/10 in L. Patient reports compliance with HEP with no questions or concerns at this tim.     Pertinent History  Patient is a 57 year old female returning to this clinic for bilat knee pain OA. Patient reports bilat knee  pain R>L over the past 3-4 years. Patient reports pain is worse in the morning, and better when she starts moving around. patient reports worst pain in the past week 10/10 and best 0/10 with legs propped up. Pt denies N/V, unexplained weight fluctuation, saddle paresthesia,B&B, fever, night sweats, or unrelenting night pain at this time.    Limitations  Lifting;Standing;Walking;House hold activities    How long can you sit comfortably?  Pain in all positions    How long can you stand comfortably?  19min or less    How long can you walk comfortably?  58min or less    Diagnostic tests  None to date    Patient Stated Goals  Be able to walk faster and longer    Pain Onset  1 to 4 weeks ago          Ther-Ex - Nustep L2 31min; L3 31min; L4 2 min - Standing heel raises 2x 10 with cuing to prevent forward lean - Bridge 2x 10 with cuing for proper form/glute contraction - Hooklying clamshell red tband 2x 10 with cuing for eccentric control - Side stepping red tband 35ft x4 with cuing throughout for "bigger steps" with eccentric control  - OMEGA 2x 10 15# x 10 20# with min cuing for eccentric control - MATRIX hip abd 3x 10 25# with max cuing for eccentric control  PT Education - 10/12/17 1446    Education provided  Yes    Education Details  Exercise form    Person(s) Educated  Patient    Methods  Explanation;Demonstration;Tactile cues;Verbal cues    Comprehension  Verbalized understanding;Returned demonstration;Verbal cues required;Tactile cues required       PT Short Term Goals - 10/07/17 0942      PT SHORT TERM GOAL #1   Title  Pt will be independent with HEP in order to improve strength and balance in order to decrease fall risk and improve function at home      Time  4    Period  Weeks    Status  New        PT Long Term Goals - 10/07/17 0943      PT LONG TERM GOAL #1   Title  Pt will increase 6MWT by at least 62m (177ft) in order to demonstrate  clinically significant improvement in muscular endurance and community ambulation    Baseline  10/07/17 1024ft    Time  8    Period  Weeks    Status  New      PT LONG TERM GOAL #2   Title  Pt will decrease 5TSTS by at least 3 seconds in order to demonstrate clinically significant improvement in LE strength    Baseline  10/07/17 17sec    Time  8    Period  Weeks    Status  New      PT LONG TERM GOAL #3   Title  Patient will increase FOTO score to  56 to demonstrate predicted increase in functional mobility to complete ADLs    Baseline  10/07/17 51    Time  8    Period  Weeks    Status  New      PT LONG TERM GOAL #4   Title  Pt will increase LEFS by at least 9 points in order to demonstrate significant improvement in lower extremity function.     Baseline  10/07/17 29%    Time  8    Period  Weeks    Status  New            Plan - 10/12/17 1455    Clinical Impression Statement  PT led patient through BLE strengthening, which patient was able to complete with accuracy following cuing. Patient requires cuing with most exercises for proper muscle activation with good carry over with cuing. PT encouraged HEP compliance to maintain PT gains.      Rehab Potential  Fair    Clinical Impairments Affecting Rehab Potential  (-) transportation, multiple comorbidities (DM II, obesity, GERD, communication barrier, sedentary lifestyle (+) positive attitude,     PT Frequency  2x / week    PT Duration  8 weeks    PT Treatment/Interventions  Neuromuscular re-education;Passive range of motion;Manual techniques;Patient/family education;Taping;Therapeutic exercise;Therapeutic activities;Functional mobility training;Moist Heat;Ultrasound;Cryotherapy;Electrical Stimulation;Dry needling;Balance training;Stair training;Gait training;ADLs/Self Care Home Management    PT Next Visit Plan  BLE strengthening    PT Home Exercise Plan  eval: bridge, supine clamshell, standing heel raises, SLR    Consulted and Agree  with Plan of Care  Patient       Patient will benefit from skilled therapeutic intervention in order to improve the following deficits and impairments:  Abnormal gait, Pain, Improper body mechanics, Postural dysfunction, Decreased activity tolerance, Decreased endurance, Decreased range of motion, Decreased strength, Decreased balance, Difficulty walking, Impaired flexibility  Visit Diagnosis: Muscle weakness (generalized)  Problem List Patient Active Problem List   Diagnosis Date Noted  . Lymphedema 01/17/2016  . Venous (peripheral) insufficiency 01/17/2016  . Pain in limb 01/17/2016  . Iron deficiency anemia due to chronic blood loss 01/09/2016  . History of endometrial cancer 08/01/2015  . Endometrial adenocarcinoma ([place]) 07/13/2015    Class: History of  . Inguinal lymphadenopathy 07/13/2015  . Long term current use of insulin ([place]) 12/12/2013  . Microalbuminuria 12/12/2013  . Diabetes ([place]) 07/28/2013  . BP (high blood pressure) 07/28/2013  . HLD (hyperlipidemia) 07/28/2013  . Adiposity 07/28/2013  . Obstructive apnea 07/28/2013  . Arthritis, degenerative 07/28/2013  . Apnea, sleep 07/28/2013  . Thyroid nodule 07/28/2013   [name] PT, DPT [name] 10/12/2017, 3:16 PM  [place] [facility] [address] [place], [address] Phone: [phone]   Fax:  [phone]  Name: Yarisa Lynam MRN: [id number] Date of Birth: 1961-01-13

## 2017-10-12 NOTE — Therapy (Signed)
[place] [facility] [address], [place], [address] Phone: [phone]   Fax:  [phone]  Occupational Therapy Treatment  Patient Details  Name: Christine Lawson MRN: [id number] Date of Birth: 1960/08/04 Referring Provider: Arvella Nigh   Encounter Date: 10/12/2017  OT End of Session - 10/12/17 1608    Visit Number  11    Number of Visits  12    Date for OT Re-Evaluation  10/12/17    OT Start Time  3151    OT Stop Time  1605    OT Time Calculation (min)  35 min    Activity Tolerance  Patient tolerated treatment well    Behavior During Therapy  [facility] for tasks assessed/performed;Impulsive       Past Medical History:  Diagnosis Date  . Diabetes mellitus without complication ([place])   . Endometrial adenocarcinoma ([place])   . GERD (gastroesophageal reflux disease)   . Hematochezia   . Hyperlipidemia   . Hypertension   . IDA (iron deficiency anemia)   . Inguinal lymphadenopathy 07/13/2015  . Irritable bowel syndrome   . Obesity   . Osteoarthritis   . Sleep apnea     Past Surgical History:  Procedure Laterality Date  . ABDOMINAL HYSTERECTOMY  feb 2013  . COLONOSCOPY WITH PROPOFOL N/A 08/02/2015   Procedure: COLONOSCOPY WITH PROPOFOL;  Surgeon: Manya Silvas, MD;  Location: [facility] ENDOSCOPY;  Service: Endoscopy;  Laterality: N/A;  . ESOPHAGOGASTRODUODENOSCOPY (EGD) WITH PROPOFOL N/A 08/02/2015   Procedure: ESOPHAGOGASTRODUODENOSCOPY (EGD) WITH PROPOFOL;  Surgeon: Manya Silvas, MD;  Location: [facility] ENDOSCOPY;  Service: Endoscopy;  Laterality: N/A;    There were no vitals filed for this visit.  Subjective Assessment - 10/12/17 1525    Subjective   Pain still there - maybe not as much - did wear the splint more than 75% -     Patient Stated Goals  I want to be able to use my hand like before - dressing , bathing , cutting food, lift and carry objects     Currently in Pain?  Yes    Pain Score  4     Pain Location   Wrist    Pain Orientation  Left    Pain Descriptors / Indicators  Aching    Pain Type  Chronic pain    Pain Onset  More than a month ago            Assess AROM for wrist and done some gentle joint mobs to wrist - with gentle traction -discomfort more in place where hand pressure is apply  No increase pain with AROM - but upon arrival - report pain 3-4/10   pt did come in carrying water bottle - of 24oz in L hand that was about 3/4 full    skin check done and pt ed on what to expect  with ionot      OT Treatments/Exercises (OP) - 10/07/17 0001            Iontophoresis   Type of Iontophoresis  Dexamethasone    Location  dorsal wrist     Dose  med patch     Time  28min     Pt to wear ionto patch this time for about hour after session             OT Education - 10/12/17 1606    Education provided  Yes    Education Details  progress and HEP  Person(s) Educated  Patient    Methods  Explanation;Demonstration    Comprehension  Verbalized understanding;Returned demonstration       OT Short Term Goals - 09/14/17 1617      OT SHORT TERM GOAL #1   Title  Pain on L wrist decrease to less than 5/10 at rest to wean out of splint     Baseline  splint more than 50% during day ,  night time - and pain between 7-10/10 pe rpt     Time  3    Period  Weeks    Status  On-going    Target Date  10/05/17      OT SHORT TERM GOAL #2   Title  Pt to be ind in HEP to decrease pain , increase AROM in wrist in all planes and increase grip     Baseline  increase symptoms since fall - reed on HEP to decrease pain and increase ROM     Time  2    Period  Weeks    Status  On-going    Target Date  09/28/17        OT Long Term Goals - 09/14/17 1618      OT LONG TERM GOAL #1   Title  Pt wrist AROM and strength increase for pt to use L hand in more than 50% of bathing , cut food, turn doorknob, without increase symptoms     Baseline  doing bathing and dressing - but not  turning doorknob and cutting food     Time  4    Period  Weeks    Status  On-going    Target Date  10/12/17      OT LONG TERM GOAL #2   Title  L grip  increase with more than 5 lbs and prehension strength increase with more than 3 lbs to use hand in cooking , laudry and cut food     Baseline  L grip 20 , R 35, - grip decrease with 10 lbs since fall     Time  4    Period  Weeks    Status  On-going    Target Date  10/12/17            Plan - 10/12/17 1608    Clinical Impression Statement  Pt cont to show great AROM - and strength 4/5 - and with AROM for L wrist  did not had increase pain -but pt report coming in still pain about 3-4/10 - did this date again ionto with dexamethazone     Occupational performance deficits (Please refer to evaluation for details):  ADL's;IADL's;Play;Leisure    Rehab Potential  Fair    Current Impairments/barriers affecting progress:  co-morbitities     OT Frequency  1x / week    OT Duration  2 weeks    OT Treatment/Interventions  Self-care/ADL training;Iontophoresis;Therapeutic exercise;Ultrasound;Contrast Bath;Fluidtherapy;Manual Therapy;Splinting;Patient/family education    Plan  reassess pain - ROM and splint wearing     Clinical Decision Making  Multiple treatment options, significant modification of task necessary    OT Home Exercise Plan  See pt instruction    Consulted and Agree with Plan of Care  Patient       Patient will benefit from skilled therapeutic intervention in order to improve the following deficits and impairments:  Pain, Impaired flexibility, Increased edema, Decreased strength, Decreased range of motion, Impaired UE functional use  Visit Diagnosis: Muscle weakness (generalized)  Pain in left wrist  Stiffness of left wrist, not elsewhere classified    Problem List Patient Active Problem List   Diagnosis Date Noted  . Lymphedema 01/17/2016  . Venous (peripheral) insufficiency 01/17/2016  . Pain in limb 01/17/2016  .  Iron deficiency anemia due to chronic blood loss 01/09/2016  . History of endometrial cancer 08/01/2015  . Endometrial adenocarcinoma ([place]) 07/13/2015    Class: History of  . Inguinal lymphadenopathy 07/13/2015  . Long term current use of insulin ([place]) 12/12/2013  . Microalbuminuria 12/12/2013  . Diabetes ([place]) 07/28/2013  . BP (high blood pressure) 07/28/2013  . HLD (hyperlipidemia) 07/28/2013  . Adiposity 07/28/2013  . Obstructive apnea 07/28/2013  . Arthritis, degenerative 07/28/2013  . Apnea, sleep 07/28/2013  . Thyroid nodule 07/28/2013    Rosalyn Gess OTR/L,CLT 10/12/2017, 4:11 PM  [place] [facility] [address], [place], [address] Phone: [phone]   Fax:  [phone]  Name: Kayleana Waites MRN: [id number] Date of Birth: 1960-08-25

## 2017-10-14 ENCOUNTER — Encounter: Payer: Medicare Other | Admitting: Physical Therapy

## 2017-10-15 ENCOUNTER — Ambulatory Visit
Admission: RE | Admit: 2017-10-15 | Discharge: 2017-10-15 | Disposition: A | Discharge status: Home / Self Care | Payer: Medicare Other | Source: Ambulatory Visit | Attending: Obstetrics and Gynecology | Admitting: Obstetrics and Gynecology

## 2017-10-15 DIAGNOSIS — Z1231 Encounter for screening mammogram for malignant neoplasm of breast: Secondary | ICD-10-CM

## 2017-10-16 ENCOUNTER — Inpatient Hospital Stay: Payer: Medicare Other | Attending: Internal Medicine

## 2017-10-16 ENCOUNTER — Other Ambulatory Visit: Payer: Self-pay | Admitting: *Deleted

## 2017-10-16 DIAGNOSIS — Z7982 Long term (current) use of aspirin: Secondary | ICD-10-CM | POA: Insufficient documentation

## 2017-10-16 DIAGNOSIS — D500 Iron deficiency anemia secondary to blood loss (chronic): Secondary | ICD-10-CM

## 2017-10-16 DIAGNOSIS — K589 Irritable bowel syndrome without diarrhea: Secondary | ICD-10-CM | POA: Insufficient documentation

## 2017-10-16 DIAGNOSIS — E669 Obesity, unspecified: Secondary | ICD-10-CM | POA: Insufficient documentation

## 2017-10-16 DIAGNOSIS — K219 Gastro-esophageal reflux disease without esophagitis: Secondary | ICD-10-CM | POA: Insufficient documentation

## 2017-10-16 DIAGNOSIS — G4730 Sleep apnea, unspecified: Secondary | ICD-10-CM | POA: Diagnosis not present

## 2017-10-16 DIAGNOSIS — M1990 Unspecified osteoarthritis, unspecified site: Secondary | ICD-10-CM | POA: Diagnosis not present

## 2017-10-16 DIAGNOSIS — Z794 Long term (current) use of insulin: Secondary | ICD-10-CM | POA: Insufficient documentation

## 2017-10-16 DIAGNOSIS — Z8542 Personal history of malignant neoplasm of other parts of uterus: Secondary | ICD-10-CM | POA: Insufficient documentation

## 2017-10-16 DIAGNOSIS — Z79899 Other long term (current) drug therapy: Secondary | ICD-10-CM | POA: Insufficient documentation

## 2017-10-16 DIAGNOSIS — I10 Essential (primary) hypertension: Secondary | ICD-10-CM | POA: Diagnosis not present

## 2017-10-16 DIAGNOSIS — E119 Type 2 diabetes mellitus without complications: Secondary | ICD-10-CM | POA: Insufficient documentation

## 2017-10-16 DIAGNOSIS — E785 Hyperlipidemia, unspecified: Secondary | ICD-10-CM | POA: Diagnosis not present

## 2017-10-16 LAB — CBC WITH DIFFERENTIAL/PLATELET
BASOS ABS: 0.1 10*3/uL (ref 0–0.1)
Basophils Relative: 1 %
Eosinophils Absolute: 0.2 10*3/uL (ref 0–0.7)
Eosinophils Relative: 2 %
HEMATOCRIT: 37 % (ref 35.0–47.0)
HEMOGLOBIN: 12.1 g/dL (ref 12.0–16.0)
LYMPHS ABS: 1.7 10*3/uL (ref 1.0–3.6)
LYMPHS PCT: 19 %
MCH: 26.8 pg (ref 26.0–34.0)
MCHC: 32.8 g/dL (ref 32.0–36.0)
MCV: 81.8 fL (ref 80.0–100.0)
Monocytes Absolute: 0.6 10*3/uL (ref 0.2–0.9)
Monocytes Relative: 7 %
NEUTROS ABS: 6.5 10*3/uL (ref 1.4–6.5)
NEUTROS PCT: 71 %
Platelets: 387 10*3/uL (ref 150–440)
RBC: 4.52 MIL/uL (ref 3.80–5.20)
RDW: 13.8 % (ref 11.5–14.5)
WBC: 9.1 10*3/uL (ref 3.6–11.0)

## 2017-10-16 LAB — COMPREHENSIVE METABOLIC PANEL
ALK PHOS: 111 U/L (ref 38–126)
ALT: 33 U/L (ref 0–44)
AST: 46 U/L — AB (ref 15–41)
Albumin: 3.7 g/dL (ref 3.5–5.0)
Anion gap: 9 (ref 5–15)
BUN: 7 mg/dL (ref 6–20)
CALCIUM: 9 mg/dL (ref 8.9–10.3)
CO2: 28 mmol/L (ref 22–32)
CREATININE: 0.52 mg/dL (ref 0.44–1.00)
Chloride: 101 mmol/L (ref 98–111)
GFR calc non Af Amer: >60 mL/min (ref >60)
Glucose, Bld: 184 mg/dL — ABNORMAL HIGH (ref 70–99)
Potassium: 3.6 mmol/L (ref 3.5–5.1)
SODIUM: 138 mmol/L (ref 135–145)
Total Bilirubin: 0.5 mg/dL (ref 0.3–1.2)
Total Protein: 7.9 g/dL (ref 6.5–8.1)

## 2017-10-16 LAB — FERRITIN: Ferritin: 257 ng/mL (ref 11–307)

## 2017-10-16 LAB — IRON AND TIBC
Iron: 66 ug/dL (ref 28–170)
Saturation Ratios: 20 % (ref 10.4–31.8)
TIBC: 328 ug/dL (ref 250–450)
UIBC: 262 ug/dL

## 2017-10-19 ENCOUNTER — Telehealth: Payer: Self-pay | Admitting: *Deleted

## 2017-10-19 ENCOUNTER — Encounter: Payer: Medicare Other | Admitting: Physical Therapy

## 2017-10-19 DIAGNOSIS — C541 Malignant neoplasm of endometrium: Secondary | ICD-10-CM

## 2017-10-19 DIAGNOSIS — D500 Iron deficiency anemia secondary to blood loss (chronic): Secondary | ICD-10-CM

## 2017-10-19 NOTE — Telephone Encounter (Signed)
Patient has appointment 8/23 for physician and possible Feraheme Does she need to keep that appointment?  Ferritin (Order [id number])  Ferritin  Order: [id number]  Status:  Final result Visible to patient:  No (Not Released) Next appt:  10/21/2017 at 11:15 AM in [facility], [place]) Dx:  Iron deficiency anemia due to chronic...   Ref Range & Units 3d ago  Ferritin 11 - 307 ng/mL 257   Comment: Performed at [facility], [address] [place], [place] [address]  Resulting Agency  [facility] LAB      Specimen Collected: 10/16/17 13:41 Last Resulted: 10/16/17 15:49     Lab Flowsheet    Order Details    View Encounter    Lab and Collection Details    Routing    Result History          Other Results from 10/16/2017   Contains abnormal data Comprehensive metabolic panel  Order: [id number]   Status:  Final result Visible to patient:  No (Not Released) Next appt:  10/21/2017 at 11:15 AM in [facility] [name], [place]) Dx:  Iron deficiency anemia due to chronic...   Ref Range & Units 3d ago  Sodium 135 - 145 mmol/L 138   Potassium 3.5 - 5.1 mmol/L 3.6   Chloride 98 - 111 mmol/L 101   CO2 22 - 32 mmol/L 28   Glucose, Bld 70 - 99 mg/dL 184High    BUN 6 - 20 mg/dL 7   Creatinine, Ser 0.44 - 1.00 mg/dL 0.52   Calcium 8.9 - 10.3 mg/dL 9.0   Total Protein 6.5 - 8.1 g/dL 7.9   Albumin 3.5 - 5.0 g/dL 3.7   AST 15 - 41 U/L 46High    ALT 0 - 44 U/L 33   Alkaline Phosphatase 38 - 126 U/L 111   Total Bilirubin 0.3 - 1.2 mg/dL 0.5   GFR calc non Af Amer >60 mL/min >60   GFR calc Af Amer >60 mL/min >60   Comment: (NOTE)  The eGFR has been calculated using the CKD EPI equation.  This calculation has not been validated in all clinical situations.  eGFR's persistently <60 mL/min signify possible Chronic Kidney  Disease.   Anion gap 5 - 15 9   Comment: Performed at [facility], [address] [place], [place] [address]  Resulting Agency  [facility]  CLIN LAB      Specimen Collected: 10/16/17 13:41 Last Resulted: 10/16/17 14:11     Lab Flowsheet    Order Details    View Encounter    Lab and Collection Details    Routing    Result History            Iron and TIBC  Order: [id number]   Status:  Final result Visible to patient:  No (Not Released) Next appt:  10/21/2017 at 11:15 AM in [facility] [name], [place]) Dx:  Iron deficiency anemia due to chronic...   Ref Range & Units 3d ago  Iron 28 - 170 ug/dL 66   TIBC 250 - 450 ug/dL 328   Saturation Ratios 10.4 - 31.8 % 20   UIBC ug/dL 262   Comment: Performed at [facility], [address], [place], [place] [address]  Resulting Agency  [facility] CLIN LAB      Specimen Collected: 10/16/17 13:41 Last Resulted: 10/16/17 15:49     Lab Flowsheet    Order Details    View Encounter    Lab and Collection Details  Routing    Result History            CBC with Differential  Order: [id number]   Status:  Final result Visible to patient:  No (Not Released) Next appt:  10/21/2017 at 11:15 AM in [facility] [name], [place]) Dx:  Iron deficiency anemia due to chronic...   Ref Range & Units 3d ago  WBC 3.6 - 11.0 K/uL 9.1   RBC 3.80 - 5.20 MIL/uL 4.52   Hemoglobin 12.0 - 16.0 g/dL 12.1   HCT 35.0 - 47.0 % 37.0   MCV 80.0 - 100.0 fL 81.8   MCH 26.0 - 34.0 pg 26.8   MCHC 32.0 - 36.0 g/dL 32.8   RDW 11.5 - 14.5 % 13.8   Platelets 150 - 440 K/uL 387   Neutrophils Relative % % 71   Neutro Abs 1.4 - 6.5 K/uL 6.5   Lymphocytes Relative % 19   Lymphs Abs 1.0 - 3.6 K/uL 1.7   Monocytes Relative % 7   Monocytes Absolute 0.2 - 0.9 K/uL 0.6   Eosinophils Relative % 2   Eosinophils Absolute 0 - 0.7 K/uL 0.2   Basophils Relative % 1   Basophils Absolute 0 - 0.1 K/uL 0.1   Comment: Performed at [facility], [address], [place], [place] [address]  Resulting Agency  [facility] CLIN LAB      Specimen Collected: 10/16/17 13:41 Last Resulted:  10/16/17 13:59

## 2017-10-19 NOTE — Telephone Encounter (Signed)
Colette, Dr. B would like to r/s this apt out by 6 months - lab/md possible fereheme. (cbc metb, ferritin/iibc). Lab encounter a few days prior to md apt.

## 2017-10-21 ENCOUNTER — Ambulatory Visit: Payer: Medicare Other | Admitting: Occupational Therapy

## 2017-10-21 ENCOUNTER — Ambulatory Visit: Payer: Medicare Other

## 2017-10-21 DIAGNOSIS — M25512 Pain in left shoulder: Secondary | ICD-10-CM | POA: Diagnosis not present

## 2017-10-21 DIAGNOSIS — M25561 Pain in right knee: Secondary | ICD-10-CM

## 2017-10-21 DIAGNOSIS — G8929 Other chronic pain: Secondary | ICD-10-CM

## 2017-10-21 DIAGNOSIS — M25532 Pain in left wrist: Secondary | ICD-10-CM

## 2017-10-21 DIAGNOSIS — M25562 Pain in left knee: Secondary | ICD-10-CM

## 2017-10-21 DIAGNOSIS — M6281 Muscle weakness (generalized): Secondary | ICD-10-CM

## 2017-10-21 DIAGNOSIS — M25632 Stiffness of left wrist, not elsewhere classified: Secondary | ICD-10-CM

## 2017-10-21 NOTE — Patient Instructions (Signed)
Same and cont with hard wrist splint

## 2017-10-21 NOTE — Therapy (Signed)
[facility] [address] [place], [address] Phone: [phone]   Fax:  [phone]  Physical Therapy Treatment  Patient Details  Name: [place] MRN: [id number] Date of Birth: 1960-03-14 Referring Provider: Arvella Nigh   Encounter Date: 10/21/2017  PT End of Session - 10/21/17 1138    Visit Number  3    Number of Visits  17    Date for PT Re-Evaluation  12/02/17    PT Start Time  1114    PT Stop Time  1158    PT Time Calculation (min)  44 min    Activity Tolerance  Patient tolerated treatment well    Behavior During Therapy  [facility] for tasks assessed/performed;Impulsive       Past Medical History:  Diagnosis Date  . Diabetes mellitus without complication ([place])   . Endometrial adenocarcinoma ([place])   . GERD (gastroesophageal reflux disease)   . Hematochezia   . Hyperlipidemia   . Hypertension   . IDA (iron deficiency anemia)   . Inguinal lymphadenopathy 07/13/2015  . Irritable bowel syndrome   . Obesity   . Osteoarthritis   . Sleep apnea     Past Surgical History:  Procedure Laterality Date  . ABDOMINAL HYSTERECTOMY  feb 2013  . COLONOSCOPY WITH PROPOFOL N/A 08/02/2015   Procedure: COLONOSCOPY WITH PROPOFOL;  Surgeon: Manya [place], MD;  Location: [facility] ENDOSCOPY;  Service: Endoscopy;  Laterality: N/A;  . ESOPHAGOGASTRODUODENOSCOPY (EGD) WITH PROPOFOL N/A 08/02/2015   Procedure: ESOPHAGOGASTRODUODENOSCOPY (EGD) WITH PROPOFOL;  Surgeon: Manya [place], MD;  Location: [facility] ENDOSCOPY;  Service: Endoscopy;  Laterality: N/A;    There were no vitals filed for this visit.  Subjective Assessment - 10/21/17 1121    Subjective  Patient reports 8/10 pain in R knee and 6/10 in L. Patient reports compliance with HEP with no questions or concerns at this time.    Pertinent History  Patient is a 57 year old female returning to this clinic for bilat knee pain OA. Patient reports bilat knee pain R>L over the  past 3-4 years. Patient reports pain is worse in the morning, and better when she starts moving around. patient reports worst pain in the past week 10/10 and best 0/10 with legs propped up. Pt denies N/V, unexplained weight fluctuation, saddle paresthesia,B&B, fever, night sweats, or unrelenting night pain at this time.    Limitations  Lifting;Standing;Walking;House hold activities    How long can you sit comfortably?  Pain in all positions    How long can you stand comfortably?  53min or less    How long can you walk comfortably?  65min or less    Diagnostic tests  None to date    Patient Stated Goals  Be able to walk faster and longer    Currently in Pain?  Yes    Pain Score  8    L in the left   Pain Location  Knee    Pain Orientation  Left;Right    Pain Descriptors / Indicators  Aching;Shooting    Pain Type  Chronic pain    Pain Onset  More than a month ago    Pain Frequency  Constant    Multiple Pain Sites  --    Pain Onset  --        TREATMENT  Ther-Ex  Nustep L4 x 5 min (unbilled) Standing heel raises 2x 10 with cuing to prevent forward lean Standing squats 2 x 10; MATRIX  hip abd 2 x 10 25# with max cuing for eccentric control Side stepping red tband 32ft x 4 with cuing throughout for "bigger steps" with eccentric control Bridge 2 x 10 with cuing for proper form/glute contraction and hold at the top; Hooklying clamshell red tband 2 x 10 with cuing for eccentric control Sit to stand from low mat table with 6# dumbbell 2 x 10; Forward step-ups to 6" step with unilateral UE support alternating LE x 10 each; Lateral step-ups to 6" step with unilateral UE support alternating LE x 10 each;                     PT Education - 10/21/17 1138    Education provided  Yes    Education Details  exercise form/technique    Person(s) Educated  Patient    Methods  Explanation    Comprehension  Verbalized understanding       PT Short Term Goals - 10/07/17 0942       PT SHORT TERM GOAL #1   Title  Pt will be independent with HEP in order to improve strength and balance in order to decrease fall risk and improve function at home      Time  4    Period  Weeks    Status  New        PT Long Term Goals - 10/07/17 0943      PT LONG TERM GOAL #1   Title  Pt will increase 6MWT by at least 23m (125ft) in order to demonstrate clinically significant improvement in muscular endurance and community ambulation    Baseline  10/07/17 1071ft    Time  8    Period  Weeks    Status  New      PT LONG TERM GOAL #2   Title  Pt will decrease 5TSTS by at least 3 seconds in order to demonstrate clinically significant improvement in LE strength    Baseline  10/07/17 17sec    Time  8    Period  Weeks    Status  New      PT LONG TERM GOAL #3   Title  Patient will increase FOTO score to  56 to demonstrate predicted increase in functional mobility to complete ADLs    Baseline  10/07/17 51    Time  8    Period  Weeks    Status  New      PT LONG TERM GOAL #4   Title  Pt will increase LEFS by at least 9 points in order to demonstrate significant improvement in lower extremity function.     Baseline  10/07/17 29%    Time  8    Period  Weeks    Status  New            Plan - 10/21/17 1139    Clinical Impression Statement  PT led patient through BLE strengthening, which patient was able to complete with accuracy following cuing. Patient requires cuing with most exercises for proper muscle activation. Pt demonstrates some fatigue with exercise and requires intermittent rest break with hydration. PT encouraged HEP compliance to maintain PT gains.    Rehab Potential  Fair    Clinical Impairments Affecting Rehab Potential  (-) transportation, multiple comorbidities (DM II, obesity, GERD, communication barrier, sedentary lifestyle (+) positive attitude,     PT Frequency  2x / week    PT Duration  8 weeks    PT Treatment/Interventions  Neuromuscular re-education;Passive range of  motion;Manual techniques;Patient/family education;Taping;Therapeutic exercise;Therapeutic activities;Functional mobility training;Moist Heat;Ultrasound;Cryotherapy;Electrical Stimulation;Dry needling;Balance training;Stair training;Gait training;ADLs/Self Care Home Management    PT Next Visit Plan  BLE strengthening    PT Home Exercise Plan  eval: bridge, supine clamshell, standing heel raises, SLR    Consulted and Agree with Plan of Care  Patient       Patient will benefit from skilled therapeutic intervention in order to improve the following deficits and impairments:  Abnormal gait, Pain, Improper body mechanics, Postural dysfunction, Decreased activity tolerance, Decreased endurance, Decreased range of motion, Decreased strength, Decreased balance, Difficulty walking, Impaired flexibility  Visit Diagnosis: Muscle weakness (generalized)  Chronic pain of left knee  Chronic pain of right knee     Problem List Patient Active Problem List   Diagnosis Date Noted  . Lymphedema 01/17/2016  . Venous (peripheral) insufficiency 01/17/2016  . Pain in limb 01/17/2016  . Iron deficiency anemia due to chronic blood loss 01/09/2016  . History of endometrial cancer 08/01/2015  . Endometrial adenocarcinoma ([place]) 07/13/2015    Class: History of  . Inguinal lymphadenopathy 07/13/2015  . Long term current use of insulin ([place]) 12/12/2013  . Microalbuminuria 12/12/2013  . Diabetes ([place]) 07/28/2013  . BP (high blood pressure) 07/28/2013  . HLD (hyperlipidemia) 07/28/2013  . Adiposity 07/28/2013  . Obstructive apnea 07/28/2013  . Arthritis, degenerative 07/28/2013  . Apnea, sleep 07/28/2013  . Thyroid nodule 07/28/2013   Lyndel Safe Deon Ivey PT, DPT, GCS  Mervil Wacker 10/22/2017, 10:43 AM  [facility] [address], [place], [address] Phone: [phone]   Fax:  [phone]  Name: [place] MRN: [id number] Date of  Birth: 20-Jul-1960

## 2017-10-21 NOTE — Therapy (Signed)
[facility] [address], [place], [address] Phone: [phone]   Fax:  [phone]  Occupational Therapy Treatment  Patient Details  Name: [place] MRN: [id number] Date of Birth: Jul 14, 1960 Referring Provider: Arvella Nigh   Encounter Date: 10/21/2017  OT End of Session - 10/21/17 1230    Visit Number  12    Number of Visits  16    Date for OT Re-Evaluation  11/18/17    OT Start Time  1200    OT Stop Time  1250    OT Time Calculation (min)  50 min    Activity Tolerance  Patient tolerated treatment well    Behavior During Therapy  [facility] for tasks assessed/performed;Impulsive       Past Medical History:  Diagnosis Date  . Diabetes mellitus without complication ([place])   . Endometrial adenocarcinoma ([place])   . GERD (gastroesophageal reflux disease)   . Hematochezia   . Hyperlipidemia   . Hypertension   . IDA (iron deficiency anemia)   . Inguinal lymphadenopathy 07/13/2015  . Irritable bowel syndrome   . Obesity   . Osteoarthritis   . Sleep apnea     Past Surgical History:  Procedure Laterality Date  . ABDOMINAL HYSTERECTOMY  feb 2013  . COLONOSCOPY WITH PROPOFOL N/A 08/02/2015   Procedure: COLONOSCOPY WITH PROPOFOL;  Surgeon: Manya [place]vas, MD;  Location[facility] ENDOSCOPY;  Service: Endoscopy;  Laterality: N/A;  . ESOPHAGOGASTRODUODENOSCOPY (EGD) WITH PROPOFOL N/A 08/02/2015   Procedure: ESOPHAGOGASTRODUODENOSCOPY (EGD) WITH PROPOFOL;  Surgeon: Manya [place]vas, MD;  Location: [facility] ENDOSCOPY;  Service: Endoscopy;  Laterality: N/A;    There were no vitals filed for this visit.  Subjective Assessment - 10/21/17 1228    Subjective   I think the pain is little better- did cont to wear the splint 75% - some pain over the back of fingers     Patient Stated Goals  I want to be able to use my hand like before - dressing , bathing , cutting food, lift and carry objects     Currently in Pain?  Yes    Pain  Score  3     Pain Location  Wrist    Pain Orientation  Left    Pain Descriptors / Indicators  Aching;Sore    Pain Type  Acute pain    Pain Onset  More than a month ago          Assess AROM for wrist and done some gentle joint mobs to wrist - with gentle traction -discomfortmore in place where hand pressure is apply No increase pain with AROM - but upon arrival - report pain 3/10   pt did come in carrying water bottle - of 24oz in L hand that was about 3/4 full  Some soreness over back of hand - but could be from splint  AROM cont to be [facility]  No pain with isometric this date    skin check done and pt ed on what to expect with ionot             OT Treatments/Exercises (OP) - 10/21/17 0001      LUE Fluidotherapy   Number Minutes Fluidotherapy  8 Minutes    LUE Fluidotherapy Location  Hand;Wrist    Comments  AROM for wrist and hand to decrease soreness in hand and wrist      Pt to wear ionto patch this time for about hour after session  OT Education - 10/21/17 1230    Education provided  Yes    Education Details  progress and cont with ionto and HEP     Person(s) Educated  Patient    Methods  Explanation;Demonstration    Comprehension  Verbalized understanding;Returned demonstration       OT Short Term Goals - 10/21/17 1253      OT SHORT TERM GOAL #1   Title  Pain on L wrist decrease to less than 5/10 at rest to wean out of splint     Baseline  splint more than 75% during day ,  night time - and pain between 7-10/10 at eval - and now 3/10       OT SHORT TERM GOAL #2   Title  Pt to be ind in HEP to decrease pain , increase AROM in wrist in all planes and increase grip     Baseline  pain decrease and AROM increase -  but unable to work on strengthening     Time  3    Period  Weeks    Status  On-going    Target Date  11/11/17        OT Long Term Goals - 10/21/17 1254      OT LONG TERM GOAL #1   Title  Pt wrist AROM and strength increase  for pt to use L hand in more than 50% of bathing , cut food, turn doorknob, without increase symptoms     Baseline  AROM improve but unable to do any strengthening - pain decreasing 3/10     Time  4    Period  Weeks    Status  On-going    Target Date  11/18/17      OT LONG TERM GOAL #2   Title  L grip  increase with more than 5 lbs and prehension strength increase with more than 3 lbs to use hand in cooking , laudry and cut food     Baseline  L grip 20 , R 35, - grip decrease with 10 lbs since fall - NT the last 2 wks because of pain     Time  4    Period  Weeks    Status  On-going    Target Date  11/18/17            Plan - 10/21/17 1232    Clinical Impression Statement  Pt cont to show progress in pain - some soreness and achiness in back of hand - but could be because of wearing splint - but she needs it for immobilization of wrist while decreasing pain     Occupational performance deficits (Please refer to evaluation for details):  ADL's;IADL's;Play;Leisure    Rehab Potential  Fair    Current Impairments/barriers affecting progress:  co-morbitities     OT Frequency  1x / week    OT Duration  4 weeks    OT Treatment/Interventions  Self-care/ADL training;Iontophoresis;Therapeutic exercise;Ultrasound;Contrast Bath;Fluidtherapy;Manual Therapy;Splinting;Patient/family education    Plan  reassess pain - ROM and splint wearing     Clinical Decision Making  Multiple treatment options, significant modification of task necessary    OT Home Exercise Plan  See pt instruction    Consulted and Agree with Plan of Care  Patient       Patient will benefit from skilled therapeutic intervention in order to improve the following deficits and impairments:  Pain, Impaired flexibility, Increased edema, Decreased strength, Decreased range of motion, Impaired UE functional  use  Visit Diagnosis: Muscle weakness (generalized) - Plan: Ot plan of care cert/re-cert  Pain in left wrist - Plan: Ot plan  of care cert/re-cert  Stiffness of left wrist, not elsewhere classified - Plan: Ot plan of care cert/re-cert    Problem List Patient Active Problem List   Diagnosis Date Noted  . Lymphedema 01/17/2016  . Venous (peripheral) insufficiency 01/17/2016  . Pain in limb 01/17/2016  . Iron deficiency anemia due to chronic blood loss 01/09/2016  . History of endometrial cancer 08/01/2015  . Endometrial adenocarcinoma ([place]) 07/13/2015    Class: History of  . Inguinal lymphadenopathy 07/13/2015  . Long term current use of insulin ([place]) 12/12/2013  . Microalbuminuria 12/12/2013  . Diabetes ([place]) 07/28/2013  . BP (high blood pressure) 07/28/2013  . HLD (hyperlipidemia) 07/28/2013  . Adiposity 07/28/2013  . Obstructive apnea 07/28/2013  . Arthritis, degenerative 07/28/2013  . Apnea, sleep 07/28/2013  . Thyroid nodule 07/28/2013    Rosalyn Gess OTR/L,CLT 10/21/2017, 1:02 PM  [facility] [address], [place], [address] Phone: [phone]   Fax:  [phone]  Name: Nadiya Pieratt MRN: [id number] Date of Birth: 1960-06-01

## 2017-10-23 ENCOUNTER — Ambulatory Visit: Payer: Medicare Other

## 2017-10-23 ENCOUNTER — Ambulatory Visit: Payer: Medicare Other | Admitting: Internal Medicine

## 2017-10-23 ENCOUNTER — Other Ambulatory Visit: Payer: Medicare Other

## 2017-10-26 ENCOUNTER — Ambulatory Visit: Payer: Medicare Other | Admitting: Physical Therapy

## 2017-10-26 ENCOUNTER — Encounter: Payer: Self-pay | Admitting: Physical Therapy

## 2017-10-26 DIAGNOSIS — M25512 Pain in left shoulder: Secondary | ICD-10-CM | POA: Diagnosis not present

## 2017-10-26 DIAGNOSIS — M6281 Muscle weakness (generalized): Secondary | ICD-10-CM

## 2017-10-26 NOTE — Therapy (Signed)
[place] [facility] [address], [place], [address] Phone: [phone]   Fax:  [phone]  Physical Therapy Treatment  Patient Details  Name: Christine Lawson MRN: [id number] Date of Birth: Nov 25, 1960 Referring Provider: Arvella Nigh   Encounter Date: 10/26/2017  PT End of Session - 10/26/17 0904    Visit Number  4    Number of Visits  17    Date for PT Re-Evaluation  12/02/17    PT Start Time  0900    PT Stop Time  0954    PT Time Calculation (min)  54 min    Activity Tolerance  Patient tolerated treatment well    Behavior During Therapy  [facility] for tasks assessed/performed;Impulsive       Past Medical History:  Diagnosis Date  . Diabetes mellitus without complication ([employer])   . Endometrial adenocarcinoma ([place])   . GERD (gastroesophageal reflux disease)   . Hematochezia   . Hyperlipidemia   . Hypertension   . IDA (iron deficiency anemia)   . Inguinal lymphadenopathy 07/13/2015  . Irritable bowel syndrome   . Obesity   . Osteoarthritis   . Sleep apnea     Past Surgical History:  Procedure Laterality Date  . ABDOMINAL HYSTERECTOMY  feb 2013  . COLONOSCOPY WITH PROPOFOL N/A 08/02/2015   Procedure: COLONOSCOPY WITH PROPOFOL;  Surgeon: Manya Silvas, MD;  Location[facility] END[facility]COPY;  Service: Endoscopy;  Laterality: N/A;  . ESOPHAGOGASTRODUODENOSCOPY (EGD) WITH PROPOFOL N/A 08/02/2015   Procedure: ESOPHAGOGASTRODUODENOSCOPY (EGD) WITH PROPOFOL;  Surgeon: Manya Silvas, MD;  Location: [facility] ENDOSCOPY;  Service: Endoscopy;  Laterality: N/A;    There were no vitals filed for this visit.  Subjective Assessment - 10/26/17 0859    Subjective  Patient reports 8/10 pain in R knee and 7/10 in L. Patient reports compliance with HEP with no questions or concerns at this time.    Pertinent History  Patient is a 57 year old female returning to this clinic for bilat knee pain OA. Patient reports bilat knee pain R>L over the  past 3-4 years. Patient reports pain is worse in the morning, and better when she starts moving around. patient reports worst pain in the past week 10/10 and best 0/10 with legs propped up. Pt denies N/V, unexplained weight fluctuation, saddle paresthesia,B&B, fever, night sweats, or unrelenting night pain at this time.    Limitations  Lifting;Standing;Walking;House hold activities    How long can you sit comfortably?  Pain in all positions    How long can you stand comfortably?  20min or less    How long can you walk comfortably?  4min or less    Diagnostic tests  None to date    Patient Stated Goals  Be able to walk faster and longer    Pain Onset  More than a month ago    Pain Onset  1 to 4 weeks ago      Ther-Ex - Nustep L2 22min; L3 34min; L4 2 min - Mini squats 4 x 10 with demo  - Side stepping red tband 24ft x4 with cuing throughout for "bigger steps" with eccentric control  - Standing ball throws 1kg on airex pad 3x 10 with min postural sway - Anti rotation with red tband 3x 10 with consistent TC and VC for proper posture - OMEGA knee ext 25# 3x 9/8/8 with cuing for eccentric control  - OMEGA hamstring curl 55# 3x 9/8/8 with cuing for full ROM -  Seated R hamstring stretch (following "cramping" after nustep and OMEGA hamstring curls)                         PT Education - 10/26/17 0904    Education provided  Yes    Education Details  Exercise form/techniques    Person(s) Educated  Patient    Methods  Explanation;Demonstration;Verbal cues    Comprehension  Verbalized understanding;Returned demonstration;Verbal cues required       PT Short Term Goals - 10/07/17 0942      PT SHORT TERM GOAL #1   Title  Pt will be independent with HEP in order to improve strength and balance in order to decrease fall risk and improve function at home      Time  4    Period  Weeks    Status  New        PT Long Term Goals - 10/07/17 0943      PT LONG TERM GOAL #1    Title  Pt will increase 6MWT by at least 55m (187ft) in order to demonstrate clinically significant improvement in muscular endurance and community ambulation    Baseline  10/07/17 1051ft    Time  8    Period  Weeks    Status  New      PT LONG TERM GOAL #2   Title  Pt will decrease 5TSTS by at least 3 seconds in order to demonstrate clinically significant improvement in LE strength    Baseline  10/07/17 17sec    Time  8    Period  Weeks    Status  New      PT LONG TERM GOAL #3   Title  Patient will increase FOTO score to  56 to demonstrate predicted increase in functional mobility to complete ADLs    Baseline  10/07/17 51    Time  8    Period  Weeks    Status  New      PT LONG TERM GOAL #4   Title  Pt will increase LEFS by at least 9 points in order to demonstrate significant improvement in lower extremity function.     Baseline  10/07/17 29%    Time  8    Period  Weeks    Status  New            Plan - 10/26/17 1856    Clinical Impression Statement  PT contined to progress therex for LE strengthening which patient was able to complete with accuracy with PT cuing and demonstration. Patient requires prolonged rest breaks between sets, but is able to complete following. Patient demonstrated increased hamstring "cramping" today, that subsided following stretching. PT encouraged patient to continue drinking water and stretching to prevent this.     Rehab Potential  Fair    Clinical Impairments Affecting Rehab Potential  (-) transportation, multiple comorbidities (DM II, obesity, GERD, communication barrier, sedentary lifestyle (+) positive attitude,     PT Frequency  2x / week    PT Duration  8 weeks    PT Treatment/Interventions  Neuromuscular re-education;Passive range of motion;Manual techniques;Patient/family education;Taping;Therapeutic exercise;Therapeutic activities;Functional mobility training;Moist Heat;Ultrasound;Cryotherapy;Electrical Stimulation;Dry needling;Balance  training;Stair training;Gait training;ADLs/Self Care Home Management    PT Next Visit Plan  BLE strengthening    PT Home Exercise Plan  eval: bridge, supine clamshell, standing heel raises, SLR    Consulted and Agree with Plan of Care  Patient  Patient will benefit from skilled therapeutic intervention in order to improve the following deficits and impairments:  Abnormal gait, Pain, Improper body mechanics, Postural dysfunction, Decreased activity tolerance, Decreased endurance, Decreased range of motion, Decreased strength, Decreased balance, Difficulty walking, Impaired flexibility  Visit Diagnosis: Muscle weakness (generalized)     Problem List Patient Active Problem List   Diagnosis Date Noted  . Lymphedema 01/17/2016  . Venous (peripheral) insufficiency 01/17/2016  . Pain in limb 01/17/2016  . Iron deficiency anemia due to chronic blood loss 01/09/2016  . History of endometrial cancer 08/01/2015  . Endometrial adenocarcinoma ([place]) 07/13/2015    Class: History of  . Inguinal lymphadenopathy 07/13/2015  . Long term current use of insulin ([place]) 12/12/2013  . Microalbuminuria 12/12/2013  . Diabetes ([place]) 07/28/2013  . BP (high blood pressure) 07/28/2013  . HLD (hyperlipidemia) 07/28/2013  . Adiposity 07/28/2013  . Obstructive apnea 07/28/2013  . Arthritis, degenerative 07/28/2013  . Apnea, sleep 07/28/2013  . Thyroid nodule 07/28/2013   [name] PT, DPT [name] 10/26/2017, 10:23 AM  [place] [facility] [address], [place], [address] Phone: [phone]   Fax:  [phone]  Name: Christine Lawson MRN: [id number] Date of Birth: June 04, 1960

## 2017-10-28 ENCOUNTER — Ambulatory Visit: Payer: Medicare Other | Admitting: Occupational Therapy

## 2017-10-28 ENCOUNTER — Ambulatory Visit: Payer: Medicare Other | Admitting: Physical Therapy

## 2017-10-28 ENCOUNTER — Encounter: Payer: Medicare Other | Admitting: Physical Therapy

## 2017-10-28 ENCOUNTER — Encounter: Payer: Self-pay | Admitting: Physical Therapy

## 2017-10-28 DIAGNOSIS — M6281 Muscle weakness (generalized): Secondary | ICD-10-CM

## 2017-10-28 DIAGNOSIS — M25632 Stiffness of left wrist, not elsewhere classified: Secondary | ICD-10-CM

## 2017-10-28 DIAGNOSIS — M25512 Pain in left shoulder: Secondary | ICD-10-CM | POA: Diagnosis not present

## 2017-10-28 DIAGNOSIS — M25532 Pain in left wrist: Secondary | ICD-10-CM

## 2017-10-28 NOTE — Patient Instructions (Signed)
Cont AROM pain free range  But switch to neoprene wrist splint until appt with MD

## 2017-10-28 NOTE — Therapy (Signed)
[place] [facility] [address], [place], [address] Phone: [phone]   Fax:  [phone]  Occupational Therapy Treatment  Patient Details  Name: Christine Lawson MRN: [id number] Date of Birth: 01-28-1961 Referring Provider: Arvella Nigh   Encounter Date: 10/28/2017  OT End of Session - 10/28/17 1731    Visit Number  13    Number of Visits  16    Date for OT Re-Evaluation  11/18/17    OT Start Time  8675    OT Stop Time  1432    OT Time Calculation (min)  44 min    Activity Tolerance  Patient tolerated treatment well    Behavior During Therapy  [facility] for tasks assessed/performed;Impulsive       Past Medical History:  Diagnosis Date  . Diabetes mellitus without complication ([place])   . Endometrial adenocarcinoma ([place])   . GERD (gastroesophageal reflux disease)   . Hematochezia   . Hyperlipidemia   . Hypertension   . IDA (iron deficiency anemia)   . Inguinal lymphadenopathy 07/13/2015  . Irritable bowel syndrome   . Obesity   . Osteoarthritis   . Sleep apnea     Past Surgical History:  Procedure Laterality Date  . ABDOMINAL HYSTERECTOMY  feb 2013  . COLONOSCOPY WITH PROPOFOL N/A 08/02/2015   Procedure: COLONOSCOPY WITH PROPOFOL;  Surgeon: Manya Silvas, MD;  Location: [facility] ENDOSCOPY;  Service: Endoscopy;  Laterality: N/A;  . ESOPHAGOGASTRODUODENOSCOPY (EGD) WITH PROPOFOL N/A 08/02/2015   Procedure: ESOPHAGOGASTRODUODENOSCOPY (EGD) WITH PROPOFOL;  Surgeon: Manya Silvas, MD;  Location: [facility] ENDOSCOPY;  Service: Endoscopy;  Laterality: N/A;    There were no vitals filed for this visit.  Subjective Assessment - 10/28/17 1729    Subjective   The pain little better- but when I hold something for a while without my splint - my wrist start hurting - have appt with MD next Wed     Patient Stated Goals  I want to be able to use my hand like before - dressing , bathing , cutting food, lift and carry objects     Currently in Pain?  Yes    Pain Score  3     Pain Location  Wrist    Pain Orientation  Left    Pain Descriptors / Indicators  Aching    Pain Type  Acute pain    Pain Onset  More than a month ago    Pain Frequency  Constant         [facility] OT Assessment - 10/28/17 0001      Strength   Right Hand Grip (lbs)  35    Right Hand Lateral Pinch  13 lbs    Right Hand 3 Point Pinch  12 lbs    Left Hand Grip (lbs)  30    Left Hand Lateral Pinch  14 lbs    Left Hand 3 Point Pinch  13 lbs      Grip strength increase 5 lbs , and prehension in L more than R  But cont to have 3/10 pain and tenderness and edema over L wrist  Trigger points on dorsal and volar forearm - and tender  AROM WFL - but still use wrist splint - without it wrist fatigue and pain increase   pt to wean to [place] neoprene splint          skin check done and pt ed on what to expectwith ionot  OT Treatments/Exercises (OP) -  10/28/17 0001      Iontophoresis   Type of Iontophoresis  Dexamethasone    Location  dorsal wrist     Dose  med patch     Time  47mn      LUE Fluidotherapy   Number Minutes Fluidotherapy  8 Minutes    LUE Fluidotherapy Location  Hand;Wrist    Comments  AROM for wrist in all planes       and keep patch on for about hour        OT Education - 10/28/17 1731    Education provided  Yes    Education Details  switch to neoprene splint     Person(s) Educated  Patient    Methods  Explanation;Demonstration    Comprehension  Verbalized understanding;Returned demonstration       OT Short Term Goals - 10/28/17 1733      OT SHORT TERM GOAL #1   Title  Pain on L wrist decrease to less than 5/10 at rest to wean out of splint     Baseline  but still splint needed - pain 3/10     Status  Partially Met      OT SHORT TERM GOAL #2   Title  Pt to be ind in HEP to decrease pain , increase AROM in wrist in all planes and increase grip     Status  Achieved        OT Long Term Goals -  10/28/17 1734      OT LONG TERM GOAL #1   Title  Pt wrist AROM and strength increase for pt to use L hand in more than 50% of bathing , cut food, turn doorknob, without increase symptoms     Baseline  use it but increase symptoms- pain 3/10 at wrist     Status  Partially Met      OT LONG TERM GOAL #2   Title  L grip  increase with more than 5 lbs and prehension strength increase with more than 3 lbs to use hand in cooking , laudry and cut food     Baseline  L grip 30, R 35, - and prehension more in L than R - but still not able to cut food and do laundery without symptoms      Status  Partially Met            Plan - 10/28/17 1732    Clinical Impression Statement  Pt cont to have AROM in L wrist WFL - and grip and prehension increase - but cont to have 3/10 pain and edema over wrist -with some triggerpoints in forearm - did not improve with wrist splint - pt to see MD next week -and possible MRI     Occupational performance deficits (Please refer to evaluation for details):  ADL's;IADL's;Play;Leisure    Rehab Potential  Fair    Current Impairments/barriers affecting progress:  co-morbitities     OT Frequency  1x / week    OT Duration  2 weeks    OT Treatment/Interventions  Self-care/ADL training;Iontophoresis;Therapeutic exercise;Ultrasound;Contrast Bath;Fluidtherapy;Manual Therapy;Splinting;Patient/family education    Plan  Results from MD visit- if MRI schedule    Clinical Decision Making  Multiple treatment options, significant modification of task necessary    OT Home Exercise Plan  See pt instruction    Consulted and Agree with Plan of Care  Patient       Patient will benefit from skilled therapeutic intervention in order to improve the  following deficits and impairments:  Pain, Impaired flexibility, Increased edema, Decreased strength, Decreased range of motion, Impaired UE functional use  Visit Diagnosis: Muscle weakness (generalized)  Pain in left wrist  Stiffness of  left wrist, not elsewhere classified    Problem List Patient Active Problem List   Diagnosis Date Noted  . Lymphedema 01/17/2016  . Venous (peripheral) insufficiency 01/17/2016  . Pain in limb 01/17/2016  . Iron deficiency anemia due to chronic blood loss 01/09/2016  . History of endometrial cancer 08/01/2015  . Endometrial adenocarcinoma ([place]) 07/13/2015    Class: History of  . Inguinal lymphadenopathy 07/13/2015  . Long term current use of insulin ([place]) 12/12/2013  . Microalbuminuria 12/12/2013  . Diabetes ([place]) 07/28/2013  . BP (high blood pressure) 07/28/2013  . HLD (hyperlipidemia) 07/28/2013  . Adiposity 07/28/2013  . Obstructive apnea 07/28/2013  . Arthritis, degenerative 07/28/2013  . Apnea, sleep 07/28/2013  . Thyroid nodule 07/28/2013    Rosalyn Gess OTR/L,CLT 10/28/2017, 5:36 PM  [place] [facility] [address], [place], [address] Phone: [phone]   Fax:  [phone]  Name: Christine Lawson MRN: [id number] Date of Birth: 08-10-1960

## 2017-10-28 NOTE — Therapy (Signed)
[place] [facility] [address], [place], [address] Phone: [phone]   Fax:  [phone]  Physical Therapy Treatment  Patient Details  Name: Christine Lawson MRN: [id number] Date of Birth: [birth year] Referring Provider: Arvella Nigh   Encounter Date: 10/28/2017  PT End of Session - 10/28/17 1442    Visit Number  5    Number of Visits  17    Date for PT Re-Evaluation  12/02/17    PT Start Time  0230    PT Stop Time  0325    PT Time Calculation (min)  55 min    Activity Tolerance  Patient tolerated treatment well    Behavior During Therapy  [facility] for tasks assessed/performed;Impulsive       Past Medical History:  Diagnosis Date  . Diabetes mellitus without complication ([place])   . Endometrial adenocarcinoma ([place])   . GERD (gastroesophageal reflux disease)   . Hematochezia   . Hyperlipidemia   . Hypertension   . IDA (iron deficiency anemia)   . Inguinal lymphadenopathy 07/13/2015  . Irritable bowel syndrome   . Obesity   . Osteoarthritis   . Sleep apnea     Past Surgical History:  Procedure Laterality Date  . ABDOMINAL HYSTERECTOMY  feb 2013  . COLONOSCOPY WITH PROPOFOL N/A 08/02/2015   Procedure: COLONOSCOPY WITH PROPOFOL;  Surgeon: Manya Silvas, MD;  Location: [facility] ENDOSCOPY;  Service: Endoscopy;  Laterality: N/A;  . ESOPHAGOGASTRODUODENOSCOPY (EGD) WITH PROPOFOL N/A 08/02/2015   Procedure: ESOPHAGOGASTRODUODENOSCOPY (EGD) WITH PROPOFOL;  Surgeon: Manya Silvas, MD;  Location: [facility] ENDOSCOPY;  Service: Endoscopy;  Laterality: N/A;    There were no vitals filed for this visit.  Subjective Assessment - 10/28/17 1440    Subjective  Patient reports 8/10 pain in bilat knees. Patient reports compliance with HEP with no questions or concerns.     Pertinent History  Patient is a 57 year old female returning to this clinic for bilat knee pain OA. Patient reports bilat knee pain R>L over the past 3-4 years.  Patient reports pain is worse in the morning, and better when she starts moving around. patient reports worst pain in the past week 10/10 and best 0/10 with legs propped up. Pt denies N/V, unexplained weight fluctuation, saddle paresthesia,B&B, fever, night sweats, or unrelenting night pain at this time.    Limitations  Lifting;Standing;Walking;House hold activities    How long can you sit comfortably?  Pain in all positions    How long can you stand comfortably?  58min or less    How long can you walk comfortably?  74min or less    Diagnostic tests  None to date    Patient Stated Goals  Be able to walk faster and longer    Pain Onset  More than a month ago    Pain Onset  1 to 4 weeks ago          Ther-Ex - Nustep L2 107min; L3 58min; For increased endurance demand on quads - Attempted lateral step down from 4in step for multiple trials with demo with full and 50% ROM from step, with patient unable to complete correctly dispute UE support and PT cuing - TKE (each side) with green tband 3x 10 with demo and max cuing needed initially with good carry over following - Total gym L 24 assisted squat 3x 10 with cuing for full knee ROM with lowering phase - Standing ball throws on airex pad  with PT  tossing ball slightly outside of patient's BOS with mod postural sway and good use of hip and ankle strategy  - MATRIX hip abd 25# 3x 10 each side with TC for eccentric control and proper posture - Heel raises with bilat 1 finger UE support for balance 3x12 - Standing leg press blue tband 2x 10                     PT Education - 10/28/17 1441    Education provided  Yes    Education Details  Exercise form    Person(s) Educated  Patient    Methods  Explanation;Verbal cues;Demonstration    Comprehension  Verbalized understanding;Verbal cues required;Returned demonstration       PT Short Term Goals - 10/07/17 0942      PT SHORT TERM GOAL #1   Title  Pt will be independent with HEP in  order to improve strength and balance in order to decrease fall risk and improve function at home      Time  4    Period  Weeks    Status  New        PT Long Term Goals - 10/07/17 0943      PT LONG TERM GOAL #1   Title  Pt will increase 6MWT by at least 59m (164ft) in order to demonstrate clinically significant improvement in muscular endurance and community ambulation    Baseline  10/07/17 1065ft    Time  8    Period  Weeks    Status  New      PT LONG TERM GOAL #2   Title  Pt will decrease 5TSTS by at least 3 seconds in order to demonstrate clinically significant improvement in LE strength    Baseline  10/07/17 17sec    Time  8    Period  Weeks    Status  New      PT LONG TERM GOAL #3   Title  Patient will increase FOTO score to  56 to demonstrate predicted increase in functional mobility to complete ADLs    Baseline  10/07/17 51    Time  8    Period  Weeks    Status  New      PT LONG TERM GOAL #4   Title  Pt will increase LEFS by at least 9 points in order to demonstrate significant improvement in lower extremity function.     Baseline  10/07/17 29%    Time  8    Period  Weeks    Status  New            Plan - 10/28/17 1551    Clinical Impression Statement  PT continued to progress therex, which patient was able to complete with accuracy with PT cuing, with rest breaks between all sets needed. Patient demonstrated and verbalized understanding of all education and proper form of all exercises.     Rehab Potential  Fair    Clinical Impairments Affecting Rehab Potential  (-) transportation, multiple comorbidities (DM II, obesity, GERD, communication barrier, sedentary lifestyle (+) positive attitude,     PT Frequency  2x / week    PT Duration  8 weeks    PT Treatment/Interventions  Neuromuscular re-education;Passive range of motion;Manual techniques;Patient/family education;Taping;Therapeutic exercise;Therapeutic activities;Functional mobility training;Moist  Heat;Ultrasound;Cryotherapy;Electrical Stimulation;Dry needling;Balance training;Stair training;Gait training;ADLs/Self Care Home Management    PT Next Visit Plan  BLE strengthening    PT Home Exercise Plan  eval: bridge, supine clamshell,  standing heel raises, SLR    Consulted and Agree with Plan of Care  Patient       Patient will benefit from skilled therapeutic intervention in order to improve the following deficits and impairments:  Abnormal gait, Pain, Improper body mechanics, Postural dysfunction, Decreased activity tolerance, Decreased endurance, Decreased range of motion, Decreased strength, Decreased balance, Difficulty walking, Impaired flexibility  Visit Diagnosis: Muscle weakness (generalized)     Problem List Patient Active Problem List   Diagnosis Date Noted  . Lymphedema 01/17/2016  . Venous (peripheral) insufficiency 01/17/2016  . Pain in limb 01/17/2016  . Iron deficiency anemia due to chronic blood loss 01/09/2016  . History of endometrial cancer 08/01/2015  . Endometrial adenocarcinoma ([place]) 07/13/2015    Class: History of  . Inguinal lymphadenopathy 07/13/2015  . Long term current use of insulin ([place]) 12/12/2013  . Microalbuminuria 12/12/2013  . Diabetes ([place]) 07/28/2013  . BP (high blood pressure) 07/28/2013  . HLD (hyperlipidemia) 07/28/2013  . Adiposity 07/28/2013  . Obstructive apnea 07/28/2013  . Arthritis, degenerative 07/28/2013  . Apnea, sleep 07/28/2013  . Thyroid nodule 07/28/2013   [name] PT, DPT [name] 10/28/2017, 3:54 PM  [place] [facility] [address] [place], [address] Phone: [phone]   Fax:  [phone]  Name: Christine Lawson MRN: [id number] Date of Birth: 08-15-1960

## 2017-11-03 ENCOUNTER — Encounter: Payer: Self-pay | Admitting: Physical Therapy

## 2017-11-03 ENCOUNTER — Ambulatory Visit: Payer: Medicare Other | Attending: Student | Admitting: Physical Therapy

## 2017-11-03 DIAGNOSIS — M25632 Stiffness of left wrist, not elsewhere classified: Secondary | ICD-10-CM | POA: Diagnosis present

## 2017-11-03 DIAGNOSIS — M25561 Pain in right knee: Secondary | ICD-10-CM | POA: Diagnosis present

## 2017-11-03 DIAGNOSIS — G8929 Other chronic pain: Secondary | ICD-10-CM | POA: Diagnosis present

## 2017-11-03 DIAGNOSIS — M25532 Pain in left wrist: Secondary | ICD-10-CM | POA: Diagnosis present

## 2017-11-03 DIAGNOSIS — M25562 Pain in left knee: Secondary | ICD-10-CM | POA: Insufficient documentation

## 2017-11-03 DIAGNOSIS — M6281 Muscle weakness (generalized): Secondary | ICD-10-CM | POA: Insufficient documentation

## 2017-11-03 NOTE — Therapy (Signed)
[facility] [address] [place], [address] Phone: [phone]   Fax:  [phone]  Physical Therapy Treatment  Patient Details  Name: Christine Lawson MRN: [id number] Date of Birth: Sep 28, 1960 Referring Provider: Arvella Nigh   Encounter Date: 11/03/2017  PT End of Session - 11/03/17 1051    Visit Number  6    Number of Visits  17    Date for PT Re-Evaluation  12/02/17    PT Start Time  1033    PT Stop Time  1115    PT Time Calculation (min)  42 min    Activity Tolerance  Patient tolerated treatment well    Behavior During Therapy  [facility] for tasks assessed/performed;Impulsive       Past Medical History:  Diagnosis Date  . Diabetes mellitus without complication ([place])   . Endometrial adenocarcinoma ([place])   . GERD (gastroesophageal reflux disease)   . Hematochezia   . Hyperlipidemia   . Hypertension   . IDA (iron deficiency anemia)   . Inguinal lymphadenopathy 07/13/2015  . Irritable bowel syndrome   . Obesity   . Osteoarthritis   . Sleep apnea     Past Surgical History:  Procedure Laterality Date  . ABDOMINAL HYSTERECTOMY  feb 2013  . COLONOSCOPY WITH PROPOFOL N/A 08/02/2015   Procedure: COLONOSCOPY WITH PROPOFOL;  Surgeon: Manya Silvas, MD;  Location: [facility] ENDOSCOPY;  Service: Endoscopy;  Laterality: N/A;  . ESOPHAGOGASTRODUODENOSCOPY (EGD) WITH PROPOFOL N/A 08/02/2015   Procedure: ESOPHAGOGASTRODUODENOSCOPY (EGD) WITH PROPOFOL;  Surgeon: Manya Silvas, MD;  Location: [facility] ENDOSCOPY;  Service: Endoscopy;  Laterality: N/A;    There were no vitals filed for this visit.  Subjective Assessment - 11/03/17 1044    Subjective  Patient reports 8/10 pain in bilat knees R>L. Patient reports she slipped this morning on her rug and caught herself on her counter with her RUE to prevent from falling. Patient reports increased R knee pain following this. Patient reports compliance with HEP with no questions or  concerns.     Pertinent History  Patient is a 57 year old female returning to this clinic for bilat knee pain OA. Patient reports bilat knee pain R>L over the past 3-4 years. Patient reports pain is worse in the morning, and better when she starts moving around. patient reports worst pain in the past week 10/10 and best 0/10 with legs propped up. Pt denies N/V, unexplained weight fluctuation, saddle paresthesia,B&B, fever, night sweats, or unrelenting night pain at this time.    Limitations  Lifting;Standing;Walking;House hold activities    How long can you sit comfortably?  Pain in all positions    How long can you stand comfortably?  4min or less    How long can you walk comfortably?  30min or less    Diagnostic tests  None to date    Patient Stated Goals  Be able to walk faster and longer    Pain Onset  More than a month ago    Pain Onset  1 to 4 weeks ago          Ther-Ex - Walking 5 min (patient completes 4 laps) with education  - Mini squat with treadmill bar support for BALANCE ONLY 3x 10 with min cuing for proper form - Step up with unilateral UE support with emphasis on pushing through LE on step 3x 10 (5 reps each LE) - Sidestepping with black sports cord 3x 3 rounds (approx 4 steps  in and out) each side with cuing for eccentric control with return - OMEGA knee ext 3x 10 25# with cuing for eccentric control - Total gym leg press L21 3x 10 with min cuing for full knee ROM                   PT Education - 11/03/17 1050    Education provided  Yes    Education Details  Exercise form    Person(s) Educated  Patient    Methods  Explanation;Demonstration;Verbal cues    Comprehension  Verbalized understanding;Verbal cues required;Returned demonstration       PT Short Term Goals - 10/07/17 0942      PT SHORT TERM GOAL #1   Title  Pt will be independent with HEP in order to improve strength and balance in order to decrease fall risk and improve function at home       Time  4    Period  Weeks    Status  New        PT Long Term Goals - 10/07/17 0943      PT LONG TERM GOAL #1   Title  Pt will increase 6MWT by at least 19m (1108ft) in order to demonstrate clinically significant improvement in muscular endurance and community ambulation    Baseline  10/07/17 1051ft    Time  8    Period  Weeks    Status  New      PT LONG TERM GOAL #2   Title  Pt will decrease 5TSTS by at least 3 seconds in order to demonstrate clinically significant improvement in LE strength    Baseline  10/07/17 17sec    Time  8    Period  Weeks    Status  New      PT LONG TERM GOAL #3   Title  Patient will increase FOTO score to  56 to demonstrate predicted increase in functional mobility to complete ADLs    Baseline  10/07/17 51    Time  8    Period  Weeks    Status  New      PT LONG TERM GOAL #4   Title  Pt will increase LEFS by at least 9 points in order to demonstrate significant improvement in lower extremity function.     Baseline  10/07/17 29%    Time  8    Period  Weeks    Status  New            Plan - 11/03/17 1110    Clinical Impression Statement  PT continued to progress LE demand through therex. Patient requires some cuing for proper form with good carry over following. Patient continues to have difficulty with SLS therex demand; PT will continue to work on this (with balance as well).     Rehab Potential  Fair    Clinical Impairments Affecting Rehab Potential  (-) transportation, multiple comorbidities (DM II, obesity, GERD, communication barrier, sedentary lifestyle (+) positive attitude,     PT Frequency  2x / week    PT Duration  8 weeks    PT Treatment/Interventions  Neuromuscular re-education;Passive range of motion;Manual techniques;Patient/family education;Taping;Therapeutic exercise;Therapeutic activities;Functional mobility training;Moist Heat;Ultrasound;Cryotherapy;Electrical Stimulation;Dry needling;Balance training;Stair training;Gait  training;ADLs/Self Care Home Management    PT Next Visit Plan  BLE strengthening    PT Home Exercise Plan  eval: bridge, supine clamshell, standing heel raises, SLR    Consulted and Agree with Plan of Care  Patient  Patient will benefit from skilled therapeutic intervention in order to improve the following deficits and impairments:  Abnormal gait, Pain, Improper body mechanics, Postural dysfunction, Decreased activity tolerance, Decreased endurance, Decreased range of motion, Decreased strength, Decreased balance, Difficulty walking, Impaired flexibility  Visit Diagnosis: Muscle weakness (generalized)     Problem List Patient Active Problem List   Diagnosis Date Noted  . Lymphedema 01/17/2016  . Venous (peripheral) insufficiency 01/17/2016  . Pain in limb 01/17/2016  . Iron deficiency anemia due to chronic blood loss 01/09/2016  . History of endometrial cancer 08/01/2015  . Endometrial adenocarcinoma ([place]) 07/13/2015    Class: History of  . Inguinal lymphadenopathy 07/13/2015  . Long term current use of insulin ([place]) 12/12/2013  . Microalbuminuria 12/12/2013  . Diabetes ([place]) 07/28/2013  . BP (high blood pressure) 07/28/2013  . HLD (hyperlipidemia) 07/28/2013  . Adiposity 07/28/2013  . Obstructive apnea 07/28/2013  . Arthritis, degenerative 07/28/2013  . Apnea, sleep 07/28/2013  . Thyroid nodule 07/28/2013   [name] PT, DPT [name] 11/03/2017, 11:12 AM  [facility] [address], [place], [address] Phone: [phone]   Fax:  [phone]  Name: Christine Lawson MRN: [id number] Date of Birth: [birth year]

## 2017-11-05 ENCOUNTER — Encounter: Payer: Self-pay | Admitting: Physical Therapy

## 2017-11-05 ENCOUNTER — Ambulatory Visit: Payer: Medicare Other | Admitting: Physical Therapy

## 2017-11-05 ENCOUNTER — Ambulatory Visit: Payer: Medicare Other | Admitting: Occupational Therapy

## 2017-11-05 DIAGNOSIS — M25632 Stiffness of left wrist, not elsewhere classified: Secondary | ICD-10-CM

## 2017-11-05 DIAGNOSIS — M6281 Muscle weakness (generalized): Secondary | ICD-10-CM | POA: Diagnosis not present

## 2017-11-05 DIAGNOSIS — M25532 Pain in left wrist: Secondary | ICD-10-CM

## 2017-11-05 NOTE — Therapy (Signed)
[facility] [address] [place], [address] Phone: [phone]   Fax:  [phone]  Physical Therapy Treatment  Patient Details  Name: Christine Lawson MRN: [id number] Date of Birth: [birth year] Referring Provider: Arvella Nigh   Encounter Date: 11/05/2017  PT End of Session - 11/05/17 1049    Visit Number  7    Number of Visits  17    Date for PT Re-Evaluation  12/02/17    PT Start Time  1030    PT Stop Time  1115    PT Time Calculation (min)  45 min    Activity Tolerance  Patient tolerated treatment well    Behavior During Therapy  [facility] for tasks assessed/performed;Impulsive       Past Medical History:  Diagnosis Date  . Diabetes mellitus without complication ([place])   . Endometrial adenocarcinoma ([place])   . GERD (gastroesophageal reflux disease)   . Hematochezia   . Hyperlipidemia   . Hypertension   . IDA (iron deficiency anemia)   . Inguinal lymphadenopathy 07/13/2015  . Irritable bowel syndrome   . Obesity   . Osteoarthritis   . Sleep apnea     Past Surgical History:  Procedure Laterality Date  . ABDOMINAL HYSTERECTOMY  feb 2013  . COLONOSCOPY WITH PROPOFOL N/A 08/02/2015   Procedure: COLONOSCOPY WITH PROPOFOL;  Surgeon: Manya Silvas, MD;  Location: [facility] ENDOSCOPY;  Service: Endoscopy;  Laterality: N/A;  . ESOPHAGOGASTRODUODENOSCOPY (EGD) WITH PROPOFOL N/A 08/02/2015   Procedure: ESOPHAGOGASTRODUODENOSCOPY (EGD) WITH PROPOFOL;  Surgeon: Manya Silvas, MD;  Location: [facility] ENDOSCOPY;  Service: Endoscopy;  Laterality: N/A;    There were no vitals filed for this visit.  Subjective Assessment - 11/05/17 1037    Subjective  Patient reports R knee pain 8/10 and L knee pain 7/10. Patient reports she met with her MD yesterday, who referred her for an MRI. Patient reports MD says     Pertinent History  Patient is a 57 year old female returning to this clinic for bilat knee pain OA. Patient reports bilat  knee pain R>L over the past 3-4 years. Patient reports pain is worse in the morning, and better when she starts moving around. patient reports worst pain in the past week 10/10 and best 0/10 with legs propped up. Pt denies N/V, unexplained weight fluctuation, saddle paresthesia,B&B, fever, night sweats, or unrelenting night pain at this time.    Limitations  Lifting;Standing;Walking;House hold activities    How long can you sit comfortably?  Pain in all positions    How long can you stand comfortably?  12mn or less    How long can you walk comfortably?  557m or less    Diagnostic tests  None to date    Patient Stated Goals  Be able to walk faster and longer    Pain Onset  More than a month ago    Pain Onset  1 to 4 weeks ago         Ther-Ex - Nustep L4 28m49m L5 2mi69mor increased quad demand - STS with unilateral UE support to stand 3x 12/12/12 with cuing for full hip ext and eccentric control  - Trials of soccer ball passes and stops to challenge patients dynamic balance and SLS, with patient able to maintain balance with increased postural sway - Wedding march 10ft37fwith demo and max cuing initially for proper form with good carry over following - OMEGA knee ext 25# 3x 10 with  cuing for eccentric control and full knee ext                    PT Education - 11/05/17 1048    Education provided  Yes    Education Details  Exercise form    Person(s) Educated  Patient    Methods  Explanation;Demonstration;Verbal cues    Comprehension  Verbalized understanding;Returned demonstration;Verbal cues required       PT Short Term Goals - 10/07/17 0942      PT SHORT TERM GOAL #1   Title  Pt will be independent with HEP in order to improve strength and balance in order to decrease fall risk and improve function at home      Time  4    Period  Weeks    Status  New        PT Long Term Goals - 10/07/17 0943      PT LONG TERM GOAL #1   Title  Pt will increase 6MWT by at  least 40m(1617f in order to demonstrate clinically significant improvement in muscular endurance and community ambulation    Baseline  10/07/17 10003f  Time  8    Period  Weeks    Status  New      PT LONG TERM GOAL #2   Title  Pt will decrease 5TSTS by at least 3 seconds in order to demonstrate clinically significant improvement in LE strength    Baseline  10/07/17 17sec    Time  8    Period  Weeks    Status  New      PT LONG TERM GOAL #3   Title  Patient will increase FOTO score to  56 to demonstrate predicted increase in functional mobility to complete ADLs    Baseline  10/07/17 51    Time  8    Period  Weeks    Status  New      PT LONG TERM GOAL #4   Title  Pt will increase LEFS by at least 9 points in order to demonstrate significant improvement in lower extremity function.     Baseline  10/07/17 29%    Time  8    Period  Weeks    Status  New            Plan - 11/05/17 1108    Clinical Impression Statement  PT continued to progress LE strengthening and balance therex. Patient is requiring some cuing with good carry over following. Patient with increased fatigue requiring rest breaks with progression.     Rehab Potential  Fair    Clinical Impairments Affecting Rehab Potential  (-) transportation, multiple comorbidities (DM II, obesity, GERD, communication barrier, sedentary lifestyle (+) positive attitude,     PT Frequency  2x / week    PT Duration  8 weeks    PT Treatment/Interventions  Neuromuscular re-education;Passive range of motion;Manual techniques;Patient/family education;Taping;Therapeutic exercise;Therapeutic activities;Functional mobility training;Moist Heat;Ultrasound;Cryotherapy;Electrical Stimulation;Dry needling;Balance training;Stair training;Gait training;ADLs/Self Care Home Management    PT Next Visit Plan  BLE strengthening    PT Home Exercise Plan  eval: bridge, supine clamshell, standing heel raises, SLR    Consulted and Agree with Plan of Care  Patient        Patient will benefit from skilled therapeutic intervention in order to improve the following deficits and impairments:  Abnormal gait, Pain, Improper body mechanics, Postural dysfunction, Decreased activity tolerance, Decreased endurance, Decreased range of motion, Decreased strength, Decreased  balance, Difficulty walking, Impaired flexibility  Visit Diagnosis: Muscle weakness (generalized)     Problem List Patient Active Problem List   Diagnosis Date Noted  . Lymphedema 01/17/2016  . Venous (peripheral) insufficiency 01/17/2016  . Pain in limb 01/17/2016  . Iron deficiency anemia due to chronic blood loss 01/09/2016  . History of endometrial cancer 08/01/2015  . Endometrial adenocarcinoma ([place]) 07/13/2015    Class: History of  . Inguinal lymphadenopathy 07/13/2015  . Long term current use of insulin ([place]) 12/12/2013  . Microalbuminuria 12/12/2013  . Diabetes ([place]) 07/28/2013  . BP (high blood pressure) 07/28/2013  . HLD (hyperlipidemia) 07/28/2013  . Adiposity 07/28/2013  . Obstructive apnea 07/28/2013  . Arthritis, degenerative 07/28/2013  . Apnea, sleep 07/28/2013  . Thyroid nodule 07/28/2013   [name] PT, DPT [name] 11/05/2017, 11:10 AM  [facility] [address] [place], [address] Phone: [phone]   Fax:  [phone]  Name: Christine Lawson MRN: [id number] Date of Birth: 05-Dec-1960

## 2017-11-05 NOTE — Therapy (Signed)
[facility] [address] [place], [address] Phone: [phone]   Fax:  [phone]  Occupational Therapy Treatment  Patient Details  Name: Christine Lawson MRN: [id number] Date of Birth: Jan 22, 1961 Referring Provider: Arvella Nigh   Encounter Date: 11/05/2017  OT End of Session - 11/05/17 1130    Visit Number  14    Number of Visits  16    Date for OT Re-Evaluation  11/18/17    OT Start Time  1115    OT Stop Time  1155    OT Time Calculation (min)  40 min    Activity Tolerance  Patient tolerated treatment well    Behavior During Therapy  [facility] for tasks assessed/performed;Impulsive       Past Medical History:  Diagnosis Date  . Diabetes mellitus without complication ([place])   . Endometrial adenocarcinoma ([place])   . GERD (gastroesophageal reflux disease)   . Hematochezia   . Hyperlipidemia   . Hypertension   . IDA (iron deficiency anemia)   . Inguinal lymphadenopathy 07/13/2015  . Irritable bowel syndrome   . Obesity   . Osteoarthritis   . Sleep apnea     Past Surgical History:  Procedure Laterality Date  . ABDOMINAL HYSTERECTOMY  feb 2013  . COLONOSCOPY WITH PROPOFOL N/A 08/02/2015   Procedure: COLONOSCOPY WITH PROPOFOL;  Surgeon: Manya Silvas, MD;  Location: [facility] ENDOSCOPY;  Service: Endoscopy;  Laterality: N/A;  . ESOPHAGOGASTRODUODENOSCOPY (EGD) WITH PROPOFOL N/A 08/02/2015   Procedure: ESOPHAGOGASTRODUODENOSCOPY (EGD) WITH PROPOFOL;  Surgeon: Manya Silvas, MD;  Location: [facility] ENDOSCOPY;  Service: Endoscopy;  Laterality: N/A;    There were no vitals filed for this visit.  Subjective Assessment - 11/05/17 1128    Subjective   Seen the Dr - they going to order me a MRI - motion okay but pain and was tender - and then weak -     Patient Stated Goals  I want to be able to use my hand like before - dressing , bathing , cutting food, lift and carry objects     Currently in Pain?  Yes    Pain Score   4     Pain Location  Wrist    Pain Orientation  Left    Pain Descriptors / Indicators  Aching    Pain Type  Acute pain    Pain Onset  More than a month ago    Pain Frequency  Constant        AROM for L wrist in all planes same as R - but some tightness at end range for ext and flexion- strain or pull             OT Treatments/Exercises (OP) - 11/05/17 0001      Ultrasound   Ultrasound Location  dorsal wrist     Ultrasound Parameters  3.3MHZ. 20% , 0.8 intensity     Ultrasound Goals  Edema      LUE Fluidotherapy   Number Minutes Fluidotherapy  8 Minutes    LUE Fluidotherapy Location  Wrist    Comments  AROM for wrist - increase edema over wrist afterward        But cont to have 3/10 pain and tenderness and edema over L wrist  Trigger points on dorsal and volar forearm - and tender  graston tool nr 2 for sweeping done volar and dorsal wrist   AROM WFL - but still use wrist splint - without it  wrist fatigue and pain increase   pt to wean to Benik neoprene splint    2 lbs weight supported for sup /pro 12 reps UD, RD to side - 12 reps  Pain free done in clinic   Teal putty gripping 15 reps  1-2 x day        OT Education - 11/05/17 1129    Education provided  Yes    Education Details  strengthening HEP     Person(s) Educated  Patient    Methods  Explanation;Demonstration    Comprehension  Verbalized understanding;Returned demonstration       OT Short Term Goals - 10/28/17 1733      OT SHORT TERM GOAL #1   Title  Pain on L wrist decrease to less than 5/10 at rest to wean out of splint     Baseline  but still splint needed - pain 3/10     Status  Partially Met      OT SHORT TERM GOAL #2   Title  Pt to be ind in HEP to decrease pain , increase AROM in wrist in all planes and increase grip     Status  Achieved        OT Long Term Goals - 10/28/17 1734      OT LONG TERM GOAL #1   Title  Pt wrist AROM and strength increase for pt to use L hand in  more than 50% of bathing , cut food, turn doorknob, without increase symptoms     Baseline  use it but increase symptoms- pain 3/10 at wrist     Status  Partially Met      OT LONG TERM GOAL #2   Title  L grip  increase with more than 5 lbs and prehension strength increase with more than 3 lbs to use hand in cooking , laudry and cut food     Baseline  L grip 30, R 35, - and prehension more in L than R - but still not able to cut food and do laundery without symptoms      Status  Partially Met            Plan - 11/05/17 1232    Clinical Impression Statement  Pt cont to have 3-4 /10 pain over dorsal , ulnar and radial wrist- after fluido or increase use - edema over dorsal wrist increase - pt in neoprene splint since last week but did add putty this date and 2 lbs weight for strengthening - because of weakenss - had no pain or increase pain with  weight and putty - MD did order to cont therapy and order MRI     Occupational performance deficits (Please refer to evaluation for details):  ADL's;IADL's;Play;Leisure    Rehab Potential  Fair    Current Impairments/barriers affecting progress:  co-morbitities     OT Frequency  1x / week    OT Duration  2 weeks    OT Treatment/Interventions  Self-care/ADL training;Iontophoresis;Therapeutic exercise;Ultrasound;Contrast Bath;Fluidtherapy;Manual Therapy;Splinting;Patient/family education    Plan  assess tolerance with 2 lbs weight and putty     Clinical Decision Making  Multiple treatment options, significant modification of task necessary    OT Home Exercise Plan  See pt instruction    Consulted and Agree with Plan of Care  Patient       Patient will benefit from skilled therapeutic intervention in order to improve the following deficits and impairments:  Pain, Impaired flexibility, Increased edema, Decreased  strength, Decreased range of motion, Impaired UE functional use  Visit Diagnosis: Muscle weakness (generalized)  Pain in left  wrist  Stiffness of left wrist, not elsewhere classified    Problem List Patient Active Problem List   Diagnosis Date Noted  . Lymphedema 01/17/2016  . Venous (peripheral) insufficiency 01/17/2016  . Pain in limb 01/17/2016  . Iron deficiency anemia due to chronic blood loss 01/09/2016  . History of endometrial cancer 08/01/2015  . Endometrial adenocarcinoma ([place]) 07/13/2015    Class: History of  . Inguinal lymphadenopathy 07/13/2015  . Long term current use of insulin ([place]) 12/12/2013  . Microalbuminuria 12/12/2013  . Diabetes ([place]) 07/28/2013  . BP (high blood pressure) 07/28/2013  . HLD (hyperlipidemia) 07/28/2013  . Adiposity 07/28/2013  . Obstructive apnea 07/28/2013  . Arthritis, degenerative 07/28/2013  . Apnea, sleep 07/28/2013  . Thyroid nodule 07/28/2013    Rosalyn Gess OTR/L,CLT 11/05/2017, 12:35 PM  [facility] [address] [place], [address] Phone: [phone]   Fax:  [phone]  Name: Christine Lawson MRN: [id number] Date of Birth: 30-Jan-1961

## 2017-11-05 NOTE — Patient Instructions (Signed)
Same AROM and Benik splint  2 lbs weight supported for sup /pro 12 reps UD, RD to side - 12 reps  Pain free  Teal putty gripping 15 reps  1-2 x day

## 2017-11-09 ENCOUNTER — Ambulatory Visit: Payer: Medicare Other | Admitting: Physical Therapy

## 2017-11-09 ENCOUNTER — Encounter: Payer: Self-pay | Admitting: Physical Therapy

## 2017-11-09 ENCOUNTER — Ambulatory Visit: Payer: Medicare Other | Admitting: Occupational Therapy

## 2017-11-09 DIAGNOSIS — M25632 Stiffness of left wrist, not elsewhere classified: Secondary | ICD-10-CM

## 2017-11-09 DIAGNOSIS — M25532 Pain in left wrist: Secondary | ICD-10-CM

## 2017-11-09 DIAGNOSIS — M6281 Muscle weakness (generalized): Secondary | ICD-10-CM

## 2017-11-09 NOTE — Therapy (Signed)
[place] [facility] [address] [place], [address] Phone: [phone]   Fax:  [phone]  Occupational Therapy Treatment  Patient Details  Name: Christine Lawson MRN: [id number] Date of Birth: December 04, 1960 Referring Provider: Arvella Nigh   Encounter Date: 11/09/2017  OT End of Session - 11/09/17 1120    Visit Number  15    Number of Visits  16    Date for OT Re-Evaluation  11/18/17    OT Start Time  1115    OT Stop Time  1155    OT Time Calculation (min)  40 min    Activity Tolerance  Patient tolerated treatment well    Behavior During Therapy  [facility] for tasks assessed/performed;Impulsive       Past Medical History:  Diagnosis Date  . Diabetes mellitus without complication ([place])   . Endometrial adenocarcinoma ([place])   . GERD (gastroesophageal reflux disease)   . Hematochezia   . Hyperlipidemia   . Hypertension   . IDA (iron deficiency anemia)   . Inguinal lymphadenopathy 07/13/2015  . Irritable bowel syndrome   . Obesity   . Osteoarthritis   . Sleep apnea     Past Surgical History:  Procedure Laterality Date  . ABDOMINAL HYSTERECTOMY  feb 2013  . COLONOSCOPY WITH PROPOFOL N/A 08/02/2015   Procedure: COLONOSCOPY WITH PROPOFOL;  Surgeon: Manya Silvas, MD;  Location: [facility] ENDOSCOPY;  Service: Endoscopy;  Laterality: N/A;  . ESOPHAGOGASTRODUODENOSCOPY (EGD) WITH PROPOFOL N/A 08/02/2015   Procedure: ESOPHAGOGASTRODUODENOSCOPY (EGD) WITH PROPOFOL;  Surgeon: Manya Silvas, MD;  Location: [facility] ENDOSCOPY;  Service: Endoscopy;  Laterality: N/A;    There were no vitals filed for this visit.  Subjective Assessment - 11/09/17 1118    Subjective   I cannot lie - I did not do the exercises for my wrist since last time- I am sorry - pain about the same     Patient Stated Goals  I want to be able to use my hand like before - dressing , bathing , cutting food, lift and carry objects     Currently in Pain?  Yes    Pain  Score  4     Pain Location  Wrist    Pain Orientation  Left    Pain Descriptors / Indicators  Aching;Tender    Pain Type  Acute pain    Pain Onset  More than a month ago    Pain Frequency  Constant           AROM for L wrist in all planes same as R - but some tightness at end range for ext and flexion- strain or pull - tender over volar wrist this date too              OT Treatments/Exercises (OP) - 11/09/17 0001      Ultrasound   Ultrasound Location  dorsal and volar wrist     Ultrasound Parameters  3.3MHZ, 20%, 0.8 intensity     Ultrasound Goals  Pain;Edema      LUE Fluidotherapy   Number Minutes Fluidotherapy  8 Minutes    LUE Fluidotherapy Location  Wrist    Comments  AROM for wrist in all planes prior to exercises -         But cont to have 3/10 pain and tenderness and edema over L wrist  Trigger points on dorsal and volar forearm - and tender  graston tool nr 2 for sweeping done volar and  dorsal wrist   AROM WFL - but still use wrist splint - without it wrist fatigue and pain increase  pt to wean to Benik neoprene splint    2 lbs weight supported for sup /pro 12 reps UD, RD to side - 12 reps  - 2 sets - no increase pain   Teal putty gripping 15 reps  1-2 x day  Needed min A       OT Education - 11/09/17 1120    Education provided  Yes    Education Details  strengthening HEP     Person(s) Educated  Patient    Methods  Explanation;Demonstration    Comprehension  Verbalized understanding;Returned demonstration       OT Short Term Goals - 10/28/17 1733      OT SHORT TERM GOAL #1   Title  Pain on L wrist decrease to less than 5/10 at rest to wean out of splint     Baseline  but still splint needed - pain 3/10     Status  Partially Met      OT SHORT TERM GOAL #2   Title  Pt to be ind in HEP to decrease pain , increase AROM in wrist in all planes and increase grip     Status  Achieved        OT Long Term Goals - 10/28/17 1734       OT LONG TERM GOAL #1   Title  Pt wrist AROM and strength increase for pt to use L hand in more than 50% of bathing , cut food, turn doorknob, without increase symptoms     Baseline  use it but increase symptoms- pain 3/10 at wrist     Status  Partially Met      OT LONG TERM GOAL #2   Title  L grip  increase with more than 5 lbs and prehension strength increase with more than 3 lbs to use hand in cooking , laudry and cut food     Baseline  L grip 30, R 35, - and prehension more in L than R - but still not able to cut food and do laundery without symptoms      Status  Partially Met            Plan - 11/09/17 1121    Occupational performance deficits (Please refer to evaluation for details):  ADL's;IADL's;Play;Leisure    Rehab Potential  Fair    Current Impairments/barriers affecting progress:  co-morbitities     OT Frequency  1x / week    OT Duration  2 weeks    OT Treatment/Interventions  Self-care/ADL training;Iontophoresis;Therapeutic exercise;Ultrasound;Contrast Bath;Fluidtherapy;Manual Therapy;Splinting;Patient/family education    Clinical Decision Making  Multiple treatment options, significant modification of task necessary    OT Home Exercise Plan  See pt instruction    Consulted and Agree with Plan of Care  Patient       Patient will benefit from skilled therapeutic intervention in order to improve the following deficits and impairments:  Pain, Impaired flexibility, Increased edema, Decreased strength, Decreased range of motion, Impaired UE functional use  Visit Diagnosis: Muscle weakness (generalized)  Pain in left wrist  Stiffness of left wrist, not elsewhere classified    Problem List Patient Active Problem List   Diagnosis Date Noted  . Lymphedema 01/17/2016  . Venous (peripheral) insufficiency 01/17/2016  . Pain in limb 01/17/2016  . Iron deficiency anemia due to chronic blood loss 01/09/2016  . History of endometrial  cancer 08/01/2015  . Endometrial  adenocarcinoma ([place]) 07/13/2015    Class: History of  . Inguinal lymphadenopathy 07/13/2015  . Long term current use of insulin ([place]) 12/12/2013  . Microalbuminuria 12/12/2013  . Diabetes ([place]) 07/28/2013  . BP (high blood pressure) 07/28/2013  . HLD (hyperlipidemia) 07/28/2013  . Adiposity 07/28/2013  . Obstructive apnea 07/28/2013  . Arthritis, degenerative 07/28/2013  . Apnea, sleep 07/28/2013  . Thyroid nodule 07/28/2013    Rosalyn Gess OTR/L,CLT 11/09/2017, 3:52 PM  [place] [facility] [address], [place], [address] Phone: [phone]   Fax:  [phone]  Name: Christine Lawson MRN: [id number] Date of Birth: March 11, 1960

## 2017-11-09 NOTE — Therapy (Signed)
[place] [facility] [address] [place], [address] Phone: [phone]   Fax:  [phone]  Physical Therapy Treatment  Patient Details  Name: Christine Lawson MRN: [id number] Date of Birth: 1960-10-11 Referring Provider: Arvella Nigh   Encounter Date: 11/09/2017  PT End of Session - 11/09/17 1043    Visit Number  8    Number of Visits  17    Date for PT Re-Evaluation  12/02/17    PT Start Time  5284    PT Stop Time  1115    PT Time Calculation (min)  45 min    Activity Tolerance  Patient tolerated treatment well    Behavior During Therapy  [facility] for tasks assessed/performed;Impulsive       Past Medical History:  Diagnosis Date  . Diabetes mellitus without complication ([place])   . Endometrial adenocarcinoma ([place])   . GERD (gastroesophageal reflux disease)   . Hematochezia   . Hyperlipidemia   . Hypertension   . IDA (iron deficiency anemia)   . Inguinal lymphadenopathy 07/13/2015  . Irritable bowel syndrome   . Obesity   . Osteoarthritis   . Sleep apnea     Past Surgical History:  Procedure Laterality Date  . ABDOMINAL HYSTERECTOMY  feb 2013  . COLONOSCOPY WITH PROPOFOL N/A 08/02/2015   Procedure: COLONOSCOPY WITH PROPOFOL;  Surgeon: Manya Silvas, MD;  Location: [facility] ENDOSCOPY;  Service: Endoscopy;  Laterality: N/A;  . ESOPHAGOGASTRODUODENOSCOPY (EGD) WITH PROPOFOL N/A 08/02/2015   Procedure: ESOPHAGOGASTRODUODENOSCOPY (EGD) WITH PROPOFOL;  Surgeon: Manya Silvas, MD;  Location: [facility] ENDOSCOPY;  Service: Endoscopy;  Laterality: N/A;    There were no vitals filed for this visit.  Subjective Assessment - 11/09/17 1031    Subjective  Patient reports 8/10 R knee pain and 7/10 L knee pain today. Patient reports that she felt has 2x over the weekend she felt "off balance" after standing up, feeling like her "legs are going to give out". Patient reports compliance with her HEP with no questions or concerns.      Pertinent History  Patient is a 57 year old female returning to this clinic for bilat knee pain OA. Patient reports bilat knee pain R>L over the past 3-4 years. Patient reports pain is worse in the morning, and better when she starts moving around. patient reports worst pain in the past week 10/10 and best 0/10 with legs propped up. Pt denies N/V, unexplained weight fluctuation, saddle paresthesia,B&B, fever, night sweats, or unrelenting night pain at this time.    Limitations  Lifting;Standing;Walking;House hold activities    How long can you sit comfortably?  Pain in all positions    How long can you stand comfortably?  7min or less    How long can you walk comfortably?  58min or less    Diagnostic tests  None to date    Patient Stated Goals  Be able to walk faster and longer    Pain Onset  More than a month ago    Pain Onset  1 to 4 weeks ago         Ther-Ex - Nustep attempted at L7 for 2 mins, but resistance was too much; L6 for 6 mins - STS with unilateral UE support to stand and remain standing without support for 3sec x10; adding standing on airex pad 2x 10 with prolonged rest break between sets; patient requires use of ankle/hip strategy with increased postural sway  - MATRIX hip ext x  10 40#; 2x 10 55# with demo and max cuing initially and after increasing wt with focus on hip ext without trunk lean compensation and eccentric control. By final set pt is able - Total gym leg press x10 L21; x10 L22; x10 L23; x10 L24with cuing for full knee ROM - Heel raises from step 3x 10 with min cuing for eccentric control with patient able to raise 50%                        PT Education - 11/09/17 1043    Education provided  Yes    Education Details  Exercise form    Person(s) Educated  Patient    Methods  Explanation;Demonstration;Verbal cues    Comprehension  Verbalized understanding;Returned demonstration;Verbal cues required       PT Short Term Goals - 10/07/17 0942       PT SHORT TERM GOAL #1   Title  Pt will be independent with HEP in order to improve strength and balance in order to decrease fall risk and improve function at home      Time  4    Period  Weeks    Status  New        PT Long Term Goals - 10/07/17 0943      PT LONG TERM GOAL #1   Title  Pt will increase 6MWT by at least 42m (137ft) in order to demonstrate clinically significant improvement in muscular endurance and community ambulation    Baseline  10/07/17 1033ft    Time  8    Period  Weeks    Status  New      PT LONG TERM GOAL #2   Title  Pt will decrease 5TSTS by at least 3 seconds in order to demonstrate clinically significant improvement in LE strength    Baseline  10/07/17 17sec    Time  8    Period  Weeks    Status  New      PT LONG TERM GOAL #3   Title  Patient will increase FOTO score to  56 to demonstrate predicted increase in functional mobility to complete ADLs    Baseline  10/07/17 51    Time  8    Period  Weeks    Status  New      PT LONG TERM GOAL #4   Title  Pt will increase LEFS by at least 9 points in order to demonstrate significant improvement in lower extremity function.     Baseline  10/07/17 29%    Time  8    Period  Weeks    Status  New            Plan - 11/09/17 1109    Clinical Impression Statement  PT continued to progress LE strengthenig, which patient is able to complete with accuracy following PT demo and cuing. Patient demonstrates increased lateral sway in first 3 sec of standing, which improves over trials.     Rehab Potential  Fair    Clinical Impairments Affecting Rehab Potential  (-) transportation, multiple comorbidities (DM II, obesity, GERD, communication barrier, sedentary lifestyle (+) positive attitude,     PT Frequency  2x / week    PT Duration  8 weeks    PT Treatment/Interventions  Neuromuscular re-education;Passive range of motion;Manual techniques;Patient/family education;Taping;Therapeutic exercise;Therapeutic  activities;Functional mobility training;Moist Heat;Ultrasound;Cryotherapy;Electrical Stimulation;Dry needling;Balance training;Stair training;Gait training;ADLs/Self Care Home Management    PT Next Visit Plan  BLE  strengthening    PT Home Exercise Plan  eval: bridge, supine clamshell, standing heel raises, SLR    Consulted and Agree with Plan of Care  Patient       Patient will benefit from skilled therapeutic intervention in order to improve the following deficits and impairments:  Abnormal gait, Pain, Improper body mechanics, Postural dysfunction, Decreased activity tolerance, Decreased endurance, Decreased range of motion, Decreased strength, Decreased balance, Difficulty walking, Impaired flexibility  Visit Diagnosis: Muscle weakness (generalized)     Problem List Patient Active Problem List   Diagnosis Date Noted  . Lymphedema 01/17/2016  . Venous (peripheral) insufficiency 01/17/2016  . Pain in limb 01/17/2016  . Iron deficiency anemia due to chronic blood loss 01/09/2016  . History of endometrial cancer 08/01/2015  . Endometrial adenocarcinoma ([place]) 07/13/2015    Class: History of  . Inguinal lymphadenopathy 07/13/2015  . Long term current use of insulin ([place]) 12/12/2013  . Microalbuminuria 12/12/2013  . Diabetes ([place]) 07/28/2013  . BP (high blood pressure) 07/28/2013  . HLD (hyperlipidemia) 07/28/2013  . Adiposity 07/28/2013  . Obstructive apnea 07/28/2013  . Arthritis, degenerative 07/28/2013  . Apnea, sleep 07/28/2013  . Thyroid nodule 07/28/2013   [name] PT, DPT [name] 11/09/2017, 11:15 AM  [place] [facility] [address] [place], [address] Phone: [phone]   Fax:  [phone]  Name: Christine Lawson MRN: [id number] Date of Birth: June 21, 1960

## 2017-11-09 NOTE — Patient Instructions (Signed)
Same

## 2017-11-10 ENCOUNTER — Ambulatory Visit: Payer: Medicare Other | Admitting: Occupational Therapy

## 2017-11-11 ENCOUNTER — Ambulatory Visit: Payer: Medicare Other | Admitting: Physical Therapy

## 2017-11-11 ENCOUNTER — Encounter: Payer: Self-pay | Admitting: Physical Therapy

## 2017-11-11 DIAGNOSIS — M25562 Pain in left knee: Secondary | ICD-10-CM

## 2017-11-11 DIAGNOSIS — M6281 Muscle weakness (generalized): Secondary | ICD-10-CM | POA: Diagnosis not present

## 2017-11-11 DIAGNOSIS — M25561 Pain in right knee: Secondary | ICD-10-CM

## 2017-11-11 DIAGNOSIS — G8929 Other chronic pain: Secondary | ICD-10-CM

## 2017-11-11 NOTE — Therapy (Signed)
[place] [facility] [address] [place], [address] Phone: [phone]   Fax:  [phone]  Physical Therapy Treatment  Patient Details  Name: Christine Lawson MRN: [id number] Date of Birth: [birth year] Referring Provider: Arvella Nigh   Encounter Date: 11/11/2017  PT End of Session - 11/11/17 1141    Visit Number  9    Number of Visits  17    Date for PT Re-Evaluation  12/02/17    PT Start Time  1115    PT Stop Time  1200    PT Time Calculation (min)  45 min    Activity Tolerance  Patient tolerated treatment well    Behavior During Therapy  [facility] for tasks assessed/performed       Past Medical History:  Diagnosis Date  . Diabetes mellitus without complication ([place])   . Endometrial adenocarcinoma ([place])   . GERD (gastroesophageal reflux disease)   . Hematochezia   . Hyperlipidemia   . Hypertension   . IDA (iron deficiency anemia)   . Inguinal lymphadenopathy 07/13/2015  . Irritable bowel syndrome   . Obesity   . Osteoarthritis   . Sleep apnea     Past Surgical History:  Procedure Laterality Date  . ABDOMINAL HYSTERECTOMY  feb 2013  . COLONOSCOPY WITH PROPOFOL N/A 08/02/2015   Procedure: COLONOSCOPY WITH PROPOFOL;  Surgeon: Manya Silvas, MD;  Location: [facility] ENDOSCOPY;  Service: Endoscopy;  Laterality: N/A;  . ESOPHAGOGASTRODUODENOSCOPY (EGD) WITH PROPOFOL N/A 08/02/2015   Procedure: ESOPHAGOGASTRODUODENOSCOPY (EGD) WITH PROPOFOL;  Surgeon: Manya Silvas, MD;  Location: [facility] ENDOSCOPY;  Service: Endoscopy;  Laterality: N/A;    There were no vitals filed for this visit.  Subjective Assessment - 11/11/17 1119    Subjective  Patient continues to report 8/10 pain in R knee and 7/10 in L knee. Patient reports no falls since last visit and compliance with HEP with no questions or concerns.     Pertinent History  Patient is a 57 year old female returning to this clinic for bilat knee pain OA. Patient reports bilat  knee pain R>L over the past 3-4 years. Patient reports pain is worse in the morning, and better when she starts moving around. patient reports worst pain in the past week 10/10 and best 0/10 with legs propped up. Pt denies N/V, unexplained weight fluctuation, saddle paresthesia,B&B, fever, night sweats, or unrelenting night pain at this time.    Limitations  Lifting;Standing;Walking;House hold activities    How long can you sit comfortably?  Pain in all positions    How long can you stand comfortably?  101min or less    How long can you walk comfortably?  18min or less    Diagnostic tests  None to date    Patient Stated Goals  Be able to walk faster and longer    Pain Onset  More than a month ago    Pain Onset  1 to 4 weeks ago      Ther-Ex - Walking 1min 679ft - STS with unilateral UE support to stand, standing on airex pad 3x 10 with prolonged rest break between sets; patient requires use of ankle/hip strategy with increased postural sway - OMEGA knee ext 20# 3x 10 with min cuing for eccentric control - Lateral step up and step down 3x 08/08/08(each side) with demo and max cuing for eccentric control  - Heel raises from 3in step 3x 10 with min cuing for eccentric control with patient able to  raise 50%                           PT Education - 11/11/17 1141    Education provided  Yes    Education Details  Exercise form    Person(s) Educated  Patient    Methods  Explanation;Demonstration;Verbal cues    Comprehension  Verbalized understanding;Returned demonstration;Verbal cues required       PT Short Term Goals - 10/07/17 0942      PT SHORT TERM GOAL #1   Title  Pt will be independent with HEP in order to improve strength and balance in order to decrease fall risk and improve function at home      Time  4    Period  Weeks    Status  New        PT Long Term Goals - 10/07/17 0943      PT LONG TERM GOAL #1   Title  Pt will increase 6MWT by at least 16m (155ft)  in order to demonstrate clinically significant improvement in muscular endurance and community ambulation    Baseline  10/07/17 1036ft    Time  8    Period  Weeks    Status  New      PT LONG TERM GOAL #2   Title  Pt will decrease 5TSTS by at least 3 seconds in order to demonstrate clinically significant improvement in LE strength    Baseline  10/07/17 17sec    Time  8    Period  Weeks    Status  New      PT LONG TERM GOAL #3   Title  Patient will increase FOTO score to  56 to demonstrate predicted increase in functional mobility to complete ADLs    Baseline  10/07/17 51    Time  8    Period  Weeks    Status  New      PT LONG TERM GOAL #4   Title  Pt will increase LEFS by at least 9 points in order to demonstrate significant improvement in lower extremity function.     Baseline  10/07/17 29%    Time  8    Period  Weeks    Status  New            Plan - 11/11/17 1157    Clinical Impression Statement  PT continued LE therex progression, with wt bearing progression. Patient is able to complete all therex with accuracy following PT demo and cuing. Throughout therex patient reports no pain, only muscle fatigue.     Clinical Impairments Affecting Rehab Potential  (-) transportation, multiple comorbidities (DM II, obesity, GERD, communication barrier, sedentary lifestyle (+) positive attitude,     PT Frequency  2x / week    PT Duration  8 weeks    PT Treatment/Interventions  Neuromuscular re-education;Passive range of motion;Manual techniques;Patient/family education;Taping;Therapeutic exercise;Therapeutic activities;Functional mobility training;Moist Heat;Ultrasound;Cryotherapy;Electrical Stimulation;Dry needling;Balance training;Stair training;Gait training;ADLs/Self Care Home Management    PT Next Visit Plan  BLE strengthening    PT Home Exercise Plan  eval: bridge, supine clamshell, standing heel raises, SLR    Consulted and Agree with Plan of Care  Patient       Patient will benefit  from skilled therapeutic intervention in order to improve the following deficits and impairments:  Abnormal gait, Pain, Improper body mechanics, Postural dysfunction, Decreased activity tolerance, Decreased endurance, Decreased range of motion, Decreased strength, Decreased balance, Difficulty  walking, Impaired flexibility  Visit Diagnosis: Muscle weakness (generalized)  Chronic pain of left knee  Chronic pain of right knee     Problem List Patient Active Problem List   Diagnosis Date Noted  . Lymphedema 01/17/2016  . Venous (peripheral) insufficiency 01/17/2016  . Pain in limb 01/17/2016  . Iron deficiency anemia due to chronic blood loss 01/09/2016  . History of endometrial cancer 08/01/2015  . Endometrial adenocarcinoma ([place]) 07/13/2015    Class: History of  . Inguinal lymphadenopathy 07/13/2015  . Long term current use of insulin ([place]) 12/12/2013  . Microalbuminuria 12/12/2013  . Diabetes ([place]) 07/28/2013  . BP (high blood pressure) 07/28/2013  . HLD (hyperlipidemia) 07/28/2013  . Adiposity 07/28/2013  . Obstructive apnea 07/28/2013  . Arthritis, degenerative 07/28/2013  . Apnea, sleep 07/28/2013  . Thyroid nodule 07/28/2013   [name] PT, DPT [name] 11/11/2017, 12:00 PM  [place] [facility] [address] [place], [address] Phone: [phone]   Fax:  [phone]  Name: Christine Lawson MRN: [id number] Date of Birth: Jan 14, 1961

## 2017-11-16 ENCOUNTER — Ambulatory Visit: Payer: Medicare Other | Admitting: Physical Therapy

## 2017-11-16 ENCOUNTER — Ambulatory Visit: Payer: Medicare Other | Admitting: Occupational Therapy

## 2017-11-16 ENCOUNTER — Encounter: Payer: Self-pay | Admitting: Physical Therapy

## 2017-11-16 DIAGNOSIS — M6281 Muscle weakness (generalized): Secondary | ICD-10-CM

## 2017-11-16 DIAGNOSIS — M25632 Stiffness of left wrist, not elsewhere classified: Secondary | ICD-10-CM

## 2017-11-16 DIAGNOSIS — M25532 Pain in left wrist: Secondary | ICD-10-CM

## 2017-11-16 NOTE — Therapy (Signed)
[facility] [address] [place], [address] Phone: [phone]   Fax:  [phone]  Occupational Therapy Treatment  Patient Details  Name: Christine Lawson MRN: [id number] Date of Birth: 21-Feb-1961 Referring Provider: Arvella Nigh   Encounter Date: 11/16/2017  OT End of Session - 11/16/17 1219    Visit Number  16    Number of Visits  16    Date for OT Re-Evaluation  11/18/17    OT Start Time  1200    OT Stop Time  1245    OT Time Calculation (min)  45 min    Activity Tolerance  Patient tolerated treatment well    Behavior During Therapy  [facility] for tasks assessed/performed       Past Medical History:  Diagnosis Date  . Diabetes mellitus without complication ([place])   . Endometrial adenocarcinoma ([place])   . GERD (gastroesophageal reflux disease)   . Hematochezia   . Hyperlipidemia   . Hypertension   . IDA (iron deficiency anemia)   . Inguinal lymphadenopathy 07/13/2015  . Irritable bowel syndrome   . Obesity   . Osteoarthritis   . Sleep apnea     Past Surgical History:  Procedure Laterality Date  . ABDOMINAL HYSTERECTOMY  feb 2013  . COLONOSCOPY WITH PROPOFOL N/A 08/02/2015   Procedure: COLONOSCOPY WITH PROPOFOL;  Surgeon: Manya Silvas, MD;  Location: [facility] ENDOSCOPY;  Service: Endoscopy;  Laterality: N/A;  . ESOPHAGOGASTRODUODENOSCOPY (EGD) WITH PROPOFOL N/A 08/02/2015   Procedure: ESOPHAGOGASTRODUODENOSCOPY (EGD) WITH PROPOFOL;  Surgeon: Manya Silvas, MD;  Location: [facility] ENDOSCOPY;  Service: Endoscopy;  Laterality: N/A;    There were no vitals filed for this visit.  Subjective Assessment - 11/16/17 1213    Subjective   I did the 2 lbs weight couple of days- but misplaced the putty- pain about the same - has my MRI on Thursday     Patient Stated Goals  I want to be able to use my hand like before - dressing , bathing , cutting food, lift and carry objects     Currently in Pain?  Yes    Pain Score   4     Pain Location  Wrist    Pain Orientation  Left    Pain Descriptors / Indicators  Aching;Tender    Pain Type  Acute pain    Pain Onset  More than a month ago    Pain Frequency  Intermittent       Cont to be tender over ulnar side of wrist - dorsal side  And some pain with thumb PA and RA - tender in webspace  But not consistent             OT Treatments/Exercises (OP) - 11/16/17 0001      Ultrasound   Ultrasound Location  dorsal na volar wrist     Ultrasound Parameters  3.3MHZ, 20%, 0.8 intensity     Ultrasound Goals  Pain;Edema      LUE Fluidotherapy   Number Minutes Fluidotherapy  11 Minutes    LUE Fluidotherapy Location  Wrist    Comments  AROM for wrist extention        But cont to have 3-4/10 pain and tenderness and edema over L wrist  Trigger points on dorsal and volar forearm - and tender graston tool nr 2 for sweeping done volar and dorsal wrist  AROM WFL but tight this date in wrist extention  - but still use  wrist splint - without it wrist fatigue and pain increase  cont with  Benik neoprene splint  2 lbs weight supported for sup /pro 12 reps UD, RD to side - 12 reps  - 2 sets - no increase pain  Teal putty gripping 15 reps 1-2 x day  Needed min A  for correct movement        OT Education - 11/16/17 1219    Education provided  Yes    Education Details  strengthening HEP     Person(s) Educated  Patient    Methods  Explanation;Demonstration    Comprehension  Returned demonstration       OT Short Term Goals - 11/16/17 1335      OT SHORT TERM GOAL #1   Title  Pain on L wrist decrease to less than 5/10 at rest to wean out of splint     Baseline  but still splint needed - pain 3/10     Status  Partially Met      OT SHORT TERM GOAL #2   Title  Pt to be ind in HEP to decrease pain , increase AROM in wrist in all planes and increase grip     Status  Achieved        OT Long Term Goals - 11/16/17 1335      OT LONG TERM GOAL  #1   Title  Pt wrist AROM and strength increase for pt to use L hand in more than 50% of bathing , cut food, turn doorknob, without increase symptoms     Baseline  use it but increase symptoms- pain 3/10 at wrist     Status  Partially Met      OT LONG TERM GOAL #2   Title  L grip  increase with more than 5 lbs and prehension strength increase with more than 3 lbs to use hand in cooking , laudry and cut food     Baseline  L grip 30, R 35, - and prehension more in L than R - but still not able to cut food and do laundery without symptoms      Status  Partially Met            Plan - 11/16/17 1220    Clinical Impression Statement  Pt cont to report pain 3-4/10 at wrist- some times on ulnar side - some on dorsal side and this date at thumb with PA and RA - pt tolerate 2 lbs weight well without increase pain -but pain stays same - pt did had some stiffness on wrist extention this date- pt to cont weight and putty until results of MRI and will  go on MD recommendations     Occupational performance deficits (Please refer to evaluation for details):  ADL's;IADL's;Play;Leisure    Rehab Potential  Fair    Current Impairments/barriers affecting progress:  co-morbitities     OT Frequency  --   await results from MRI and MD   OT Treatment/Interventions  Self-care/ADL training;Iontophoresis;Therapeutic exercise;Ultrasound;Contrast Bath;Fluidtherapy;Manual Therapy;Splinting;Patient/family education    Plan  MRI schedule and follow up with MD - contact if need to cont     Clinical Decision Making  Several treatment options, min-mod task modification necessary    OT Home Exercise Plan  See pt instruction    Consulted and Agree with Plan of Care  Patient       Patient will benefit from skilled therapeutic intervention in order to improve the following deficits and impairments:  Pain, Impaired flexibility, Increased edema, Decreased strength, Decreased range of motion, Impaired UE functional use  Visit  Diagnosis: Pain in left wrist  Stiffness of left wrist, not elsewhere classified  Muscle weakness (generalized)    Problem List Patient Active Problem List   Diagnosis Date Noted  . Lymphedema 01/17/2016  . Venous (peripheral) insufficiency 01/17/2016  . Pain in limb 01/17/2016  . Iron deficiency anemia due to chronic blood loss 01/09/2016  . History of endometrial cancer 08/01/2015  . Endometrial adenocarcinoma ([place]) 07/13/2015    Class: History of  . Inguinal lymphadenopathy 07/13/2015  . Long term current use of insulin ([place]) 12/12/2013  . Microalbuminuria 12/12/2013  . Diabetes ([place]) 07/28/2013  . BP (high blood pressure) 07/28/2013  . HLD (hyperlipidemia) 07/28/2013  . Adiposity 07/28/2013  . Obstructive apnea 07/28/2013  . Arthritis, degenerative 07/28/2013  . Apnea, sleep 07/28/2013  . Thyroid nodule 07/28/2013    Rosalyn Gess OTR/L,CLT 11/16/2017, 1:48 PM  [facility] [address] [place], [address] Phone: [phone]   Fax:  [phone]  Name: Jowanna Loeffler MRN: [id number] Date of Birth: 1960/03/07

## 2017-11-16 NOTE — Patient Instructions (Signed)
Do some contrast to R hand - because of increase edema in middle and ring finger - with pain   cont same HEP with L wrist - until MRI and follow up with MD

## 2017-11-16 NOTE — Therapy (Signed)
[facility] [address], [place], [address] Phone: [phone]   Fax:  [phone]  Physical Therapy Treatment  Patient Details  Name: Christine Lawson MRN: [id number] Date of Birth: Jun 06, 1960 Referring Provider: Arvella Nigh   Encounter Date: 11/16/2017  PT End of Session - 11/16/17 1130    Visit Number  10    Number of Visits  17    Date for PT Re-Evaluation  12/02/17    PT Start Time  1115    PT Stop Time  1200    PT Time Calculation (min)  45 min    Activity Tolerance  Patient tolerated treatment well    Behavior During Therapy  [facility] for tasks assessed/performed       Past Medical History:  Diagnosis Date  . Diabetes mellitus without complication ([place])   . Endometrial adenocarcinoma ([place])   . GERD (gastroesophageal reflux disease)   . Hematochezia   . Hyperlipidemia   . Hypertension   . IDA (iron deficiency anemia)   . Inguinal lymphadenopathy 07/13/2015  . Irritable bowel syndrome   . Obesity   . Osteoarthritis   . Sleep apnea     Past Surgical History:  Procedure Laterality Date  . ABDOMINAL HYSTERECTOMY  feb 2013  . COLONOSCOPY WITH PROPOFOL N/A 08/02/2015   Procedure: COLONOSCOPY WITH PROPOFOL;  Surgeon: Manya Silvas, MD;  Location: [facility] ENDOSCOPY;  Service: Endoscopy;  Laterality: N/A;  . ESOPHAGOGASTRODUODENOSCOPY (EGD) WITH PROPOFOL N/A 08/02/2015   Procedure: ESOPHAGOGASTRODUODENOSCOPY (EGD) WITH PROPOFOL;  Surgeon: Manya Silvas, MD;  Location: [facility] ENDOSCOPY;  Service: Endoscopy;  Laterality: N/A;    There were no vitals filed for this visit.  Subjective Assessment - 11/16/17 1123    Subjective  Patient reports 7/10 pain in R knee and 6/10 pain in L knee. Patient reports [place] the [place] this [place] and was able to walk up "some hills" with minimal pain, which she is pleased with. Reports compliance with HEP with no questions or concerns.     Pertinent History  Patient  is a 57 year old female returning to this clinic for bilat knee pain OA. Patient reports bilat knee pain R>L over the past 3-4 years. Patient reports pain is worse in the morning, and better when she starts moving around. patient reports worst pain in the past week 10/10 and best 0/10 with legs propped up. Pt denies N/V, unexplained weight fluctuation, saddle paresthesia,B&B, fever, night sweats, or unrelenting night pain at this time.    Limitations  Lifting;Standing;Walking;House hold activities    How long can you sit comfortably?  Pain in all positions    How long can you stand comfortably?  101min or less    How long can you walk comfortably?  6min or less    Diagnostic tests  None to date    Patient Stated Goals  Be able to walk faster and longer    Pain Onset  More than a month ago    Pain Onset  1 to 4 weeks ago          Ther-Ex - Nustep L6 20min; L7 44min for increased quad demand - Sidestepping on airex pad 21ft (66ft down and back x6) 3x with hover UE support and SBA from PT for safety - Sidestep with squat 3x 10 (5 out and 5 back) with demo and min cuing for squat form with good carry over following - OMEGA knee ext 20# x10; 2x  10 25# with min cuing for eccentric control - Multiple trials of ball toss on airex pad with PT tossing ball outside of BOS with bilat UE reaching - lateral step ups and step downs 3x 10(5 up and to the L/5 up and to the R) with demo and cuing to prevent hip rotation with good carry over                     PT Education - 11/16/17 1129    Education provided  Yes    Education Details  Exercise form    Person(s) Educated  Patient    Methods  Explanation;Demonstration;Verbal cues    Comprehension  Verbalized understanding;Returned demonstration;Verbal cues required       PT Short Term Goals - 10/07/17 0942      PT SHORT TERM GOAL #1   Title  Pt will be independent with HEP in order to improve strength and balance in order to decrease  fall risk and improve function at home      Time  4    Period  Weeks    Status  New        PT Long Term Goals - 10/07/17 0943      PT LONG TERM GOAL #1   Title  Pt will increase 6MWT by at least 31m (146ft) in order to demonstrate clinically significant improvement in muscular endurance and community ambulation    Baseline  10/07/17 1080ft    Time  8    Period  Weeks    Status  New      PT LONG TERM GOAL #2   Title  Pt will decrease 5TSTS by at least 3 seconds in order to demonstrate clinically significant improvement in LE strength    Baseline  10/07/17 17sec    Time  8    Period  Weeks    Status  New      PT LONG TERM GOAL #3   Title  Patient will increase FOTO score to  56 to demonstrate predicted increase in functional mobility to complete ADLs    Baseline  10/07/17 51    Time  8    Period  Weeks    Status  New      PT LONG TERM GOAL #4   Title  Pt will increase LEFS by at least 9 points in order to demonstrate significant improvement in lower extremity function.     Baseline  10/07/17 29%    Time  8    Period  Weeks    Status  New            Plan - 11/16/17 1158    Clinical Impression Statement  PT continued therex progression, increasing wt bearing demand as able. Patient requires cuing for accuracy, but demonstrates good carry over following demo and cuing. Patient is demonstrating increased balance with ball toss, and is able to complete more accurate catching/throwing.     Rehab Potential  Fair    Clinical Impairments Affecting Rehab Potential  (-) transportation, multiple comorbidities (DM II, obesity, GERD, communication barrier, sedentary lifestyle (+) positive attitude,     PT Frequency  2x / week    PT Duration  8 weeks    PT Treatment/Interventions  Neuromuscular re-education;Passive range of motion;Manual techniques;Patient/family education;Taping;Therapeutic exercise;Therapeutic activities;Functional mobility training;Moist  Heat;Ultrasound;Cryotherapy;Electrical Stimulation;Dry needling;Balance training;Stair training;Gait training;ADLs/Self Care Home Management    PT Next Visit Plan  BLE strengthening    PT Home Exercise Plan  eval: bridge, supine clamshell, standing heel raises, SLR    Consulted and Agree with Plan of Care  Patient       Patient will benefit from skilled therapeutic intervention in order to improve the following deficits and impairments:  Abnormal gait, Pain, Improper body mechanics, Postural dysfunction, Decreased activity tolerance, Decreased endurance, Decreased range of motion, Decreased strength, Decreased balance, Difficulty walking, Impaired flexibility  Visit Diagnosis: Muscle weakness (generalized)     Problem List Patient Active Problem List   Diagnosis Date Noted  . Lymphedema 01/17/2016  . Venous (peripheral) insufficiency 01/17/2016  . Pain in limb 01/17/2016  . Iron deficiency anemia due to chronic blood loss 01/09/2016  . History of endometrial cancer 08/01/2015  . Endometrial adenocarcinoma ([place]) 07/13/2015    Class: History of  . Inguinal lymphadenopathy 07/13/2015  . Long term current use of insulin ([place]) 12/12/2013  . Microalbuminuria 12/12/2013  . Diabetes ([place]) 07/28/2013  . BP (high blood pressure) 07/28/2013  . HLD (hyperlipidemia) 07/28/2013  . Adiposity 07/28/2013  . Obstructive apnea 07/28/2013  . Arthritis, degenerative 07/28/2013  . Apnea, sleep 07/28/2013  . Thyroid nodule 07/28/2013   [name] PT, DPT [name] 11/16/2017, 12:00 PM  [facility] [address] [place], [address] Phone: [phone]   Fax:  [phone]  Name: Cerinity Zynda MRN: [id number] Date of Birth: [birth year]

## 2017-11-18 ENCOUNTER — Ambulatory Visit: Payer: Medicare Other | Admitting: Physical Therapy

## 2017-11-18 ENCOUNTER — Encounter: Payer: Self-pay | Admitting: Physical Therapy

## 2017-11-18 DIAGNOSIS — M25562 Pain in left knee: Secondary | ICD-10-CM

## 2017-11-18 DIAGNOSIS — M25561 Pain in right knee: Secondary | ICD-10-CM

## 2017-11-18 DIAGNOSIS — M6281 Muscle weakness (generalized): Secondary | ICD-10-CM

## 2017-11-18 DIAGNOSIS — G8929 Other chronic pain: Secondary | ICD-10-CM

## 2017-11-18 NOTE — Therapy (Signed)
[facility] [address], [place], [address] Phone: [phone]   Fax:  [phone]  Physical Therapy Treatment  Patient Details  Name: Christine Lawson MRN: [id number] Date of Birth: 1960/06/09 Referring Provider: Arvella Nigh   Encounter Date: 11/18/2017  PT End of Session - 11/18/17 1054    Visit Number  11    Number of Visits  17    Date for PT Re-Evaluation  12/02/17    PT Start Time  0539    PT Stop Time  1120    PT Time Calculation (min)  40 min    Activity Tolerance  Patient tolerated treatment well    Behavior During Therapy  [facility] for tasks assessed/performed       Past Medical History:  Diagnosis Date  . Diabetes mellitus without complication ([place])   . Endometrial adenocarcinoma ([place])   . GERD (gastroesophageal reflux disease)   . Hematochezia   . Hyperlipidemia   . Hypertension   . IDA (iron deficiency anemia)   . Inguinal lymphadenopathy 07/13/2015  . Irritable bowel syndrome   . Obesity   . Osteoarthritis   . Sleep apnea     Past Surgical History:  Procedure Laterality Date  . ABDOMINAL HYSTERECTOMY  feb 2013  . COLONOSCOPY WITH PROPOFOL N/A 08/02/2015   Procedure: COLONOSCOPY WITH PROPOFOL;  Surgeon: Manya Silvas, MD;  Location: [facility] ENDOSCOPY;  Service: Endoscopy;  Laterality: N/A;  . ESOPHAGOGASTRODUODENOSCOPY (EGD) WITH PROPOFOL N/A 08/02/2015   Procedure: ESOPHAGOGASTRODUODENOSCOPY (EGD) WITH PROPOFOL;  Surgeon: Manya Silvas, MD;  Location: [facility] ENDOSCOPY;  Service: Endoscopy;  Laterality: N/A;    There were no vitals filed for this visit.  Subjective Assessment - 11/18/17 1048    Subjective  Patient reports 8/10 pain in R knee and 7/10 pain in L knee. Reports compliance with HEP with no questions or concerns. Patient reports no falls but reports her RLE feels like it's going to "give out" sometimes and feels "weak"    Pertinent History  Patient is a 57 year old female  returning to this clinic for bilat knee pain OA. Patient reports bilat knee pain R>L over the past 3-4 years. Patient reports pain is worse in the morning, and better when she starts moving around. patient reports worst pain in the past week 10/10 and best 0/10 with legs propped up. Pt denies N/V, unexplained weight fluctuation, saddle paresthesia,B&B, fever, night sweats, or unrelenting night pain at this time.    Limitations  Lifting;Standing;Walking;House hold activities    How long can you sit comfortably?  Pain in all positions    How long can you stand comfortably?  62min or less    How long can you walk comfortably?  63min or less    Diagnostic tests  None to date    Patient Stated Goals  Be able to walk faster and longer    Pain Onset  More than a month ago    Pain Onset  1 to 4 weeks ago       Ther-Ex -Nustep L7 17min; L8 6min for increased quad demand - Sidestepping on airex pad with red tband at LE for resistance 28ft (35ft down and back x6) 3x with hover UE support and SBA from PT for safety - Sidestep with squat with yellow tband at ankles for increased resistance 4x 10 (5 out and 5 back) with demo and min cuing for squat form with good carry over following - OMEGA knee  ext 3x 10 25# with min cuing for eccentric control - OMEGA leg press 125# 3x 10 50% ROM                         PT Education - 11/18/17 1053    Education provided  Yes    Education Details  Exercise form    Person(s) Educated  Patient    Methods  Explanation;Verbal cues;Demonstration    Comprehension  Verbalized understanding;Verbal cues required;Returned demonstration       PT Short Term Goals - 10/07/17 0942      PT SHORT TERM GOAL #1   Title  Pt will be independent with HEP in order to improve strength and balance in order to decrease fall risk and improve function at home      Time  4    Period  Weeks    Status  New        PT Long Term Goals - 10/07/17 0943      PT LONG  TERM GOAL #1   Title  Pt will increase 6MWT by at least 69m (182ft) in order to demonstrate clinically significant improvement in muscular endurance and community ambulation    Baseline  10/07/17 1095ft    Time  8    Period  Weeks    Status  New      PT LONG TERM GOAL #2   Title  Pt will decrease 5TSTS by at least 3 seconds in order to demonstrate clinically significant improvement in LE strength    Baseline  10/07/17 17sec    Time  8    Period  Weeks    Status  New      PT LONG TERM GOAL #3   Title  Patient will increase FOTO score to  56 to demonstrate predicted increase in functional mobility to complete ADLs    Baseline  10/07/17 51    Time  8    Period  Weeks    Status  New      PT LONG TERM GOAL #4   Title  Pt will increase LEFS by at least 9 points in order to demonstrate significant improvement in lower extremity function.     Baseline  10/07/17 29%    Time  8    Period  Weeks    Status  New            Plan - 11/18/17 1225    Clinical Impression Statement  PT continued therex progression per pts tolerance, with patient requiring consistent cuing through session to ensure proper form/technique with therex.     Rehab Potential  Fair    Clinical Impairments Affecting Rehab Potential  (-) transportation, multiple comorbidities (DM II, obesity, GERD, communication barrier, sedentary lifestyle (+) positive attitude,     PT Frequency  2x / week    PT Duration  8 weeks    PT Treatment/Interventions  Neuromuscular re-education;Passive range of motion;Manual techniques;Patient/family education;Taping;Therapeutic exercise;Therapeutic activities;Functional mobility training;Moist Heat;Ultrasound;Cryotherapy;Electrical Stimulation;Dry needling;Balance training;Stair training;Gait training;ADLs/Self Care Home Management    PT Next Visit Plan  BLE strengthening    PT Home Exercise Plan  eval: bridge, supine clamshell, standing heel raises, SLR    Consulted and Agree with Plan of Care   Patient       Patient will benefit from skilled therapeutic intervention in order to improve the following deficits and impairments:  Abnormal gait, Pain, Improper body mechanics, Postural dysfunction, Decreased activity tolerance, Decreased  endurance, Decreased range of motion, Decreased strength, Decreased balance, Difficulty walking, Impaired flexibility  Visit Diagnosis: Muscle weakness (generalized)  Chronic pain of right knee  Chronic pain of left knee     Problem List Patient Active Problem List   Diagnosis Date Noted  . Lymphedema 01/17/2016  . Venous (peripheral) insufficiency 01/17/2016  . Pain in limb 01/17/2016  . Iron deficiency anemia due to chronic blood loss 01/09/2016  . History of endometrial cancer 08/01/2015  . Endometrial adenocarcinoma ([place]) 07/13/2015    Class: History of  . Inguinal lymphadenopathy 07/13/2015  . Long term current use of insulin ([place]) 12/12/2013  . Microalbuminuria 12/12/2013  . Diabetes ([place]) 07/28/2013  . BP (high blood pressure) 07/28/2013  . HLD (hyperlipidemia) 07/28/2013  . Adiposity 07/28/2013  . Obstructive apnea 07/28/2013  . Arthritis, degenerative 07/28/2013  . Apnea, sleep 07/28/2013  . Thyroid nodule 07/28/2013   [name] PT, DPT [name] 11/18/2017, 12:55 PM  [facility] [address] [place], [address] Phone: [phone]   Fax:  [phone]  Name: Christine Lawson MRN: [id number] Date of Birth: 1960-08-08

## 2017-11-23 ENCOUNTER — Ambulatory Visit: Payer: Medicare Other | Admitting: Physical Therapy

## 2017-11-25 ENCOUNTER — Encounter: Payer: Self-pay | Admitting: Physical Therapy

## 2017-11-25 ENCOUNTER — Ambulatory Visit: Payer: Medicare Other | Admitting: Physical Therapy

## 2017-11-25 DIAGNOSIS — M6281 Muscle weakness (generalized): Secondary | ICD-10-CM

## 2017-11-25 NOTE — Therapy (Signed)
[place] [facility] [address], [place], [address] Phone: [phone]   Fax:  [phone]  Physical Therapy Treatment  Patient Details  Name: Christine Lawson MRN: [id number] Date of Birth: 1960-08-23 Referring Provider: Arvella Nigh   Encounter Date: 11/25/2017  PT End of Session - 11/25/17 1125    Visit Number  12    Number of Visits  17    Date for PT Re-Evaluation  12/02/17    PT Start Time  1115    PT Stop Time  1200    PT Time Calculation (min)  45 min    Activity Tolerance  Patient tolerated treatment well    Behavior During Therapy  [facility] for tasks assessed/performed       Past Medical History:  Diagnosis Date  . Diabetes mellitus without complication ([place])   . Endometrial adenocarcinoma ([place])   . GERD (gastroesophageal reflux disease)   . Hematochezia   . Hyperlipidemia   . Hypertension   . IDA (iron deficiency anemia)   . Inguinal lymphadenopathy 07/13/2015  . Irritable bowel syndrome   . Obesity   . Osteoarthritis   . Sleep apnea     Past Surgical History:  Procedure Laterality Date  . ABDOMINAL HYSTERECTOMY  feb 2013  . COLONOSCOPY WITH PROPOFOL N/A 08/02/2015   Procedure: COLONOSCOPY WITH PROPOFOL;  Surgeon: Manya Silvas, MD;  Location: [facility] ENDOSCOPY;  Service: Endoscopy;  Laterality: N/A;  . ESOPHAGOGASTRODUODENOSCOPY (EGD) WITH PROPOFOL N/A 08/02/2015   Procedure: ESOPHAGOGASTRODUODENOSCOPY (EGD) WITH PROPOFOL;  Surgeon: Manya Silvas, MD;  Location: [facility] ENDOSCOPY;  Service: Endoscopy;  Laterality: N/A;    There were no vitals filed for this visit.  Subjective Assessment - 11/25/17 1122    Subjective  Patient reports 7/10 pain today and no falls since last visit. Patient reports compliance with HEP with no questions or concerns.     Pertinent History  Patient is a 57 year old female returning to this clinic for bilat knee pain OA. Patient reports bilat knee pain R>L over the past 3-4  years. Patient reports pain is worse in the morning, and better when she starts moving around. patient reports worst pain in the past week 10/10 and best 0/10 with legs propped up. Pt denies N/V, unexplained weight fluctuation, saddle paresthesia,B&B, fever, night sweats, or unrelenting night pain at this time.    Limitations  Lifting;Standing;Walking;House hold activities    How long can you sit comfortably?  Pain in all positions    How long can you stand comfortably?  45min or less    How long can you walk comfortably?  29min or less    Diagnostic tests  None to date    Patient Stated Goals  Be able to walk faster and longer    Pain Onset  More than a month ago    Pain Onset  1 to 4 weeks ago       Ther-Ex -Nustep L8 59min; L9 34min for increased quad demand - STS with 3kg overhead press 3x 12/13/18  - Ambulating over 4.5in and 8in obstacles with patient able to clear obstacles with minimal increased time needed and normal gait with obstacle negotiation over trials - Gait through obstacle course with sidestepping on airex pad, stepping over obstacles, and weaving through cones over 43ft x5 w/ gait belt and CGA for safety with no LOB; 1-2 min rest breaks between sets - OMEGA leg press 125# 4x 20 with min cuing for eccentric  control                          PT Education - 11/25/17 1124    Education provided  Yes    Education Details  Exercise form    Person(s) Educated  Patient    Methods  Explanation;Demonstration;Verbal cues    Comprehension  Returned demonstration;Verbalized understanding;Verbal cues required       PT Short Term Goals - 10/07/17 0942      PT SHORT TERM GOAL #1   Title  Pt will be independent with HEP in order to improve strength and balance in order to decrease fall risk and improve function at home      Time  4    Period  Weeks    Status  New        PT Long Term Goals - 10/07/17 0943      PT LONG TERM GOAL #1   Title  Pt will  increase 6MWT by at least 50m (112ft) in order to demonstrate clinically significant improvement in muscular endurance and community ambulation    Baseline  10/07/17 1054ft    Time  8    Period  Weeks    Status  New      PT LONG TERM GOAL #2   Title  Pt will decrease 5TSTS by at least 3 seconds in order to demonstrate clinically significant improvement in LE strength    Baseline  10/07/17 17sec    Time  8    Period  Weeks    Status  New      PT LONG TERM GOAL #3   Title  Patient will increase FOTO score to  56 to demonstrate predicted increase in functional mobility to complete ADLs    Baseline  10/07/17 51    Time  8    Period  Weeks    Status  New      PT LONG TERM GOAL #4   Title  Pt will increase LEFS by at least 9 points in order to demonstrate significant improvement in lower extremity function.     Baseline  10/07/17 29%    Time  8    Period  Weeks    Status  New            Plan - 11/25/17 1154    Clinical Impression Statement  PT continued therex progression adding in high level dynamic balance activity, which patient is able to complete safely with minimal increased time needed when approaching obstacles. Patient is increasing dynamic balance with safety, strength and endurance; leading PT to discuss d/c with patient next week to robust HEP. Patient and PT agree that pt should be able to safely complete therex at home following d/c next Wed.     Rehab Potential  Fair    Clinical Impairments Affecting Rehab Potential  (-) transportation, multiple comorbidities (DM II, obesity, GERD, communication barrier, sedentary lifestyle (+) positive attitude,     PT Frequency  2x / week    PT Duration  8 weeks    PT Treatment/Interventions  Neuromuscular re-education;Passive range of motion;Manual techniques;Patient/family education;Taping;Therapeutic exercise;Therapeutic activities;Functional mobility training;Moist Heat;Ultrasound;Cryotherapy;Electrical Stimulation;Dry needling;Balance  training;Stair training;Gait training;ADLs/Self Care Home Management    PT Next Visit Plan  Putting together HEP for d/c     PT Home Exercise Plan  eval: bridge, supine clamshell, standing heel raises, SLR    Consulted and Agree with Plan of Care  Patient  Patient will benefit from skilled therapeutic intervention in order to improve the following deficits and impairments:  Abnormal gait, Pain, Improper body mechanics, Postural dysfunction, Decreased activity tolerance, Decreased endurance, Decreased range of motion, Decreased strength, Decreased balance, Difficulty walking, Impaired flexibility  Visit Diagnosis: Muscle weakness (generalized)     Problem List Patient Active Problem List   Diagnosis Date Noted  . Lymphedema 01/17/2016  . Venous (peripheral) insufficiency 01/17/2016  . Pain in limb 01/17/2016  . Iron deficiency anemia due to chronic blood loss 01/09/2016  . History of endometrial cancer 08/01/2015  . Endometrial adenocarcinoma ([place]) 07/13/2015    Class: History of  . Inguinal lymphadenopathy 07/13/2015  . Long term current use of insulin ([place]) 12/12/2013  . Microalbuminuria 12/12/2013  . Diabetes ([place]) 07/28/2013  . BP (high blood pressure) 07/28/2013  . HLD (hyperlipidemia) 07/28/2013  . Adiposity 07/28/2013  . Obstructive apnea 07/28/2013  . Arthritis, degenerative 07/28/2013  . Apnea, sleep 07/28/2013  . Thyroid nodule 07/28/2013   [name] PT, DPT [name] 11/25/2017, 11:58 AM  [place] [facility] [address], [place], [address] Phone: [phone]   Fax:  [phone]  Name: Christine Lawson MRN: [id number] Date of Birth: 1961-03-01

## 2017-11-30 ENCOUNTER — Ambulatory Visit: Payer: Medicare Other | Admitting: Physical Therapy

## 2017-11-30 ENCOUNTER — Encounter: Payer: Self-pay | Admitting: Physical Therapy

## 2017-11-30 DIAGNOSIS — M6281 Muscle weakness (generalized): Secondary | ICD-10-CM | POA: Diagnosis not present

## 2017-11-30 NOTE — Therapy (Signed)
[place] [facility] [address], [place], [address] Phone: [phone]   Fax:  [phone]  Physical Therapy Treatment  Patient Details  Name: Christine Lawson MRN: [id number] Date of Birth: 1960-07-27 Referring Provider (PT): gaines   Encounter Date: 11/30/2017  PT End of Session - 11/30/17 1113    Visit Number  13    Number of Visits  17    Date for PT Re-Evaluation  12/02/17    PT Start Time  1108    PT Stop Time  1201    PT Time Calculation (min)  53 min    Activity Tolerance  Patient tolerated treatment well    Behavior During Therapy  [facility] for tasks assessed/performed       Past Medical History:  Diagnosis Date  . Diabetes mellitus without complication ([place])   . Endometrial adenocarcinoma ([place])   . GERD (gastroesophageal reflux disease)   . Hematochezia   . Hyperlipidemia   . Hypertension   . IDA (iron deficiency anemia)   . Inguinal lymphadenopathy 07/13/2015  . Irritable bowel syndrome   . Obesity   . Osteoarthritis   . Sleep apnea     Past Surgical History:  Procedure Laterality Date  . ABDOMINAL HYSTERECTOMY  feb 2013  . COLONOSCOPY WITH PROPOFOL N/A 08/02/2015   Procedure: COLONOSCOPY WITH PROPOFOL;  Surgeon: Manya Silvas, MD;  Location: [facility] ENDOSCOPY;  Service: Endoscopy;  Laterality: N/A;  . ESOPHAGOGASTRODUODENOSCOPY (EGD) WITH PROPOFOL N/A 08/02/2015   Procedure: ESOPHAGOGASTRODUODENOSCOPY (EGD) WITH PROPOFOL;  Surgeon: Manya Silvas, MD;  Location: [facility] ENDOSCOPY;  Service: Endoscopy;  Laterality: N/A;    There were no vitals filed for this visit.  Subjective Assessment - 11/30/17 1109    Subjective  Patient continues to report 6/10 bilat knee pain today. Patient reports this pain is baseline for her. Patient reports she feels like she is confedient to DC PT to robust HEP at this time to increase LE function. Patient reports compliance with HEP with no questions or concerns.      Pertinent History  Patient is a 57 year old female returning to this clinic for bilat knee pain OA. Patient reports bilat knee pain R>L over the past 3-4 years. Patient reports pain is worse in the morning, and better when she starts moving around. patient reports worst pain in the past week 10/10 and best 0/10 with legs propped up. Pt denies N/V, unexplained weight fluctuation, saddle paresthesia,B&B, fever, night sweats, or unrelenting night pain at this time.    Limitations  Lifting;Standing;Walking;House hold activities    How long can you sit comfortably?  Pain in all positions    How long can you stand comfortably?  62min or less    How long can you walk comfortably?  15min or less    Diagnostic tests  None to date    Patient Stated Goals  Be able to walk faster and longer    Pain Onset  More than a month ago    Pain Onset  1 to 4 weeks ago         Ther-Ex -Nustep L10 65min for increased quad demand - STS with 3sec hold in standing following x10; adding in airex pad 2 x10 (unilateral UE support - Sidestepping with red tband + squat 3x 10 (5x down 5x back) - Standing hip abd red tband 2x 10 each LE with cuing to prevent lateral trunk lean with good carry over - Standing hip  ext red tband 2x 10 each LE with cuing for eccentric control - Standing heel raises 3x 10 with treadmill bar support for balance only  - Mini squat with treadmill bar support for balance only 3x 10 with cuing initially for proper form with good carry over following - 3 way lunge on slider 3x 10 each LE                    PT Education - 11/30/17 1112    Education provided  Yes    Education Details  HEP recommendations; exercise form    Person(s) Educated  Patient    Methods  Explanation;Demonstration;Handout;Verbal cues    Comprehension  Verbalized understanding;Returned demonstration;Verbal cues required       PT Short Term Goals - 10/07/17 0942      PT SHORT TERM GOAL #1   Title  Pt will  be independent with HEP in order to improve strength and balance in order to decrease fall risk and improve function at home      Time  4    Period  Weeks    Status  New        PT Long Term Goals - 10/07/17 0943      PT LONG TERM GOAL #1   Title  Pt will increase 6MWT by at least 29m (119ft) in order to demonstrate clinically significant improvement in muscular endurance and community ambulation    Baseline  10/07/17 1035ft    Time  8    Period  Weeks    Status  New      PT LONG TERM GOAL #2   Title  Pt will decrease 5TSTS by at least 3 seconds in order to demonstrate clinically significant improvement in LE strength    Baseline  10/07/17 17sec    Time  8    Period  Weeks    Status  New      PT LONG TERM GOAL #3   Title  Patient will increase FOTO score to  56 to demonstrate predicted increase in functional mobility to complete ADLs    Baseline  10/07/17 51    Time  8    Period  Weeks    Status  New      PT LONG TERM GOAL #4   Title  Pt will increase LEFS by at least 9 points in order to demonstrate significant improvement in lower extremity function.     Baseline  10/07/17 29%    Time  8    Period  Weeks    Status  New            Plan - 11/30/17 1324    Clinical Impression Statement  PT led patient through therex progression that she can complete at home, to be added to next session for DC. PT ensured proper form with all therex for HEP. Next visit PT will review goals and DC patient.     Rehab Potential  Fair    Clinical Impairments Affecting Rehab Potential  (-) transportation, multiple comorbidities (DM II, obesity, GERD, communication barrier, sedentary lifestyle (+) positive attitude,     PT Frequency  2x / week    PT Duration  8 weeks    PT Treatment/Interventions  Neuromuscular re-education;Passive range of motion;Manual techniques;Patient/family education;Taping;Therapeutic exercise;Therapeutic activities;Functional mobility training;Moist  Heat;Ultrasound;Cryotherapy;Electrical Stimulation;Dry needling;Balance training;Stair training;Gait training;ADLs/Self Care Home Management    PT Next Visit Plan  Putting together HEP for d/c     PT  Home Exercise Plan  eval: bridge, supine clamshell, standing heel raises, SLR    Consulted and Agree with Plan of Care  Patient       Patient will benefit from skilled therapeutic intervention in order to improve the following deficits and impairments:  Abnormal gait, Pain, Improper body mechanics, Postural dysfunction, Decreased activity tolerance, Decreased endurance, Decreased range of motion, Decreased strength, Decreased balance, Difficulty walking, Impaired flexibility  Visit Diagnosis: Muscle weakness (generalized)     Problem List Patient Active Problem List   Diagnosis Date Noted  . Lymphedema 01/17/2016  . Venous (peripheral) insufficiency 01/17/2016  . Pain in limb 01/17/2016  . Iron deficiency anemia due to chronic blood loss 01/09/2016  . History of endometrial cancer 08/01/2015  . Endometrial adenocarcinoma ([place]) 07/13/2015    Class: History of  . Inguinal lymphadenopathy 07/13/2015  . Long term current use of insulin ([place]) 12/12/2013  . Microalbuminuria 12/12/2013  . Diabetes ([place]) 07/28/2013  . BP (high blood pressure) 07/28/2013  . HLD (hyperlipidemia) 07/28/2013  . Adiposity 07/28/2013  . Obstructive apnea 07/28/2013  . Arthritis, degenerative 07/28/2013  . Apnea, sleep 07/28/2013  . Thyroid nodule 07/28/2013   [name] PT, DPT  [name] 11/30/2017, 1:45 PM  [place] [facility] [address] [place], [address] Phone: [phone]   Fax:  [phone]  Name: Isel Skufca MRN: [id number] Date of Birth: 20-Jan-1961

## 2017-12-02 ENCOUNTER — Ambulatory Visit: Payer: Medicare Other | Attending: Student | Admitting: Physical Therapy

## 2017-12-02 ENCOUNTER — Encounter: Payer: Self-pay | Admitting: Physical Therapy

## 2017-12-02 DIAGNOSIS — M25532 Pain in left wrist: Secondary | ICD-10-CM | POA: Insufficient documentation

## 2017-12-02 DIAGNOSIS — M6281 Muscle weakness (generalized): Secondary | ICD-10-CM | POA: Diagnosis present

## 2017-12-02 DIAGNOSIS — M25632 Stiffness of left wrist, not elsewhere classified: Secondary | ICD-10-CM | POA: Insufficient documentation

## 2017-12-02 NOTE — Therapy (Signed)
[place] [facility] [address], [place], [address] Phone: [phone]   Fax:  [phone]  Physical Therapy Treatment  Patient Details  Name: Christine Lawson MRN: [id number] Date of Birth: August 10, 1960 Referring Provider (PT): gaines   Encounter Date: 12/02/2017  PT End of Session - 12/02/17 1124    Visit Number  14    Number of Visits  17    Date for PT Re-Evaluation  12/02/17    PT Start Time  1115    PT Stop Time  1200    PT Time Calculation (min)  45 min    Activity Tolerance  Patient tolerated treatment well    Behavior During Therapy  [facility] for tasks assessed/performed       Past Medical History:  Diagnosis Date  . Diabetes mellitus without complication ([place])   . Endometrial adenocarcinoma ([place])   . GERD (gastroesophageal reflux disease)   . Hematochezia   . Hyperlipidemia   . Hypertension   . IDA (iron deficiency anemia)   . Inguinal lymphadenopathy 07/13/2015  . Irritable bowel syndrome   . Obesity   . Osteoarthritis   . Sleep apnea     Past Surgical History:  Procedure Laterality Date  . ABDOMINAL HYSTERECTOMY  feb 2013  . COLONOSCOPY WITH PROPOFOL N/A 08/02/2015   Procedure: COLONOSCOPY WITH PROPOFOL;  Surgeon: Manya Silvas, MD;  Location: [facility] ENDOSCOPY;  Service: Endoscopy;  Laterality: N/A;  . ESOPHAGOGASTRODUODENOSCOPY (EGD) WITH PROPOFOL N/A 08/02/2015   Procedure: ESOPHAGOGASTRODUODENOSCOPY (EGD) WITH PROPOFOL;  Surgeon: Manya Silvas, MD;  Location: [facility] ENDOSCOPY;  Service: Endoscopy;  Laterality: N/A;    There were no vitals filed for this visit.  Subjective Assessment - 12/02/17 1144    Subjective  Patient continues to report baseline pain of 4/10 in bilat knees. Patient reports she feels ind in HEP and is able to complete this on her own with accuracy and no increased pain.     Pertinent History  Patient is a 57 year old female returning to this clinic for bilat knee pain OA.  Patient reports bilat knee pain R>L over the past 3-4 years. Patient reports pain is worse in the morning, and better when she starts moving around. patient reports worst pain in the past week 10/10 and best 0/10 with legs propped up. Pt denies N/V, unexplained weight fluctuation, saddle paresthesia,B&B, fever, night sweats, or unrelenting night pain at this time.    Limitations  Lifting;Standing;Walking;House hold activities    How long can you sit comfortably?  Pain in all positions    How long can you stand comfortably?  32mn or less    How long can you walk comfortably?  538m or less    Diagnostic tests  None to date    Patient Stated Goals  Be able to walk faster and longer    Pain Onset  More than a month ago    Pain Onset  1 to 4 weeks ago       Ther-Ex - 6 MWT [id number] patient reporting no increased pain following - Trials of STS without UE use with best time 11.4sec  - Led patient through FOTO and LEFS to ensure DC readiness - Step Ups leading with R LE 3x 10 and leading with LLE 3x 10 with min cuing for eccentric control; patient able to complete  - Squat thruster with bilat 5# DB with min cuing for proper form initially with good carry over -  Reviewed HEP from last session with addition of therex in this session with parameters of strengthening (3x/week, 5-6 exercises per session, sets 3-5, reps 5-10) and of a continued aerobic walking program (130mn/week; about 228m/day) for continued progress to increase function independently. Patient verbalizes understanding                       PT Education - 12/02/17 1123    Education provided  Yes    Education Details  HEP and DC recommendations    Person(s) Educated  Patient    Methods  Explanation;Demonstration;Verbal cues;Handout    Comprehension  Verbalized understanding;Returned demonstration;Verbal cues required       PT Short Term Goals - 10/07/17 0942      PT SHORT TERM GOAL #1   Title  Pt will be  independent with HEP in order to improve strength and balance in order to decrease fall risk and improve function at home      Time  4    Period  Weeks    Status  New        PT Long Term Goals - 12/02/17 1125      PT LONG TERM GOAL #1   Title  Pt will increase 6MWT by at least [id number] order to demonstrate clinically significant improvement in muscular endurance and community ambulation    Baseline  12/02/17 1161ft29fh no increased pain    Time  8    Period  Weeks    Status  Achieved      PT LONG TERM GOAL #2   Title  Pt will decrease 5TSTS by at least 3 seconds in order to demonstrate clinically significant improvement in LE strength    Baseline  12/02/17 11.44sec    Time  8    Period  Weeks    Status  Achieved      PT LONG TERM GOAL #3   Title  Patient will increase FOTO score to  56 to demonstrate predicted increase in functional mobility to complete ADLs    Baseline  12/02/17 66    Time  8    Period  Weeks    Status  Achieved      PT LONG TERM GOAL #4   Title  Pt will increase LEFS by at least 9 points in order to demonstrate significant improvement in lower extremity function.     Baseline  12/02/17 53    Time  8    Period  Weeks    Status  New            Plan - 12/02/17 1151    Clinical Impression Statement  Patient has met all goals to DC PT to robust HEP to maintain progress. Patient has returned to minimal baseline pain in bilat knees d/t comorbidities, and is able to complete all ADLs. PT reviewed HEP and DC recommendations with patient, which patient verbalized understanding of. Patient was given PT clinic conact infor should any further questions/concerns arise.     Rehab Potential  Fair    Clinical Impairments Affecting Rehab Potential  (-) transportation, multiple comorbidities (DM II, obesity, GERD, communication barrier, sedentary lifestyle (+) positive attitude,     PT Frequency  2x / week    PT Duration  8 weeks    PT Treatment/Interventions   Neuromuscular re-education;Passive range of motion;Manual techniques;Patient/family education;Taping;Therapeutic exercise;Therapeutic activities;Functional mobility training;Moist Heat;Ultrasound;Cryotherapy;Electrical Stimulation;Dry needling;Balance training;Stair training;Gait training;ADLs/Self Care Home Management    PT Home  Exercise Plan  eval: bridge, supine clamshell, standing heel raises, SLR    Consulted and Agree with Plan of Care  Patient       Patient will benefit from skilled therapeutic intervention in order to improve the following deficits and impairments:  Abnormal gait, Pain, Improper body mechanics, Postural dysfunction, Decreased activity tolerance, Decreased endurance, Decreased range of motion, Decreased strength, Decreased balance, Difficulty walking, Impaired flexibility  Visit Diagnosis: Muscle weakness (generalized)     Problem List Patient Active Problem List   Diagnosis Date Noted  . Lymphedema 01/17/2016  . Venous (peripheral) insufficiency 01/17/2016  . Pain in limb 01/17/2016  . Iron deficiency anemia due to chronic blood loss 01/09/2016  . History of endometrial cancer 08/01/2015  . Endometrial adenocarcinoma ([place]) 07/13/2015    Class: History of  . Inguinal lymphadenopathy 07/13/2015  . Long term current use of insulin ([place]) 12/12/2013  . Microalbuminuria 12/12/2013  . Diabetes ([place]) 07/28/2013  . BP (high blood pressure) 07/28/2013  . HLD (hyperlipidemia) 07/28/2013  . Adiposity 07/28/2013  . Obstructive apnea 07/28/2013  . Arthritis, degenerative 07/28/2013  . Apnea, sleep 07/28/2013  . Thyroid nodule 07/28/2013   [name] PT, DPT [name] 12/02/2017, 12:52 PM  [facility] [address], [place], [address] Phone: [phone]   Fax:  [phone]  Name: Brentney Goldbach MRN: [id number] Date of Birth: 1960-05-11

## 2017-12-04 ENCOUNTER — Ambulatory Visit: Payer: Medicare Other | Admitting: Occupational Therapy

## 2017-12-04 DIAGNOSIS — M25532 Pain in left wrist: Secondary | ICD-10-CM

## 2017-12-04 DIAGNOSIS — M6281 Muscle weakness (generalized): Secondary | ICD-10-CM | POA: Diagnosis not present

## 2017-12-04 DIAGNOSIS — M25632 Stiffness of left wrist, not elsewhere classified: Secondary | ICD-10-CM

## 2017-12-04 NOTE — Patient Instructions (Signed)
Prefab wrist splint on all time for immobilization  contrast 2 x day  And pain free AROM for L wrist in all planes  Review modifications of task - pt hold smart phone for long periods in L hand

## 2017-12-04 NOTE — Therapy (Signed)
[facility] [address], [place], [address] Phone: [phone]   Fax:  [phone]  Occupational Therapy Treatment  Patient Details  Name: Christine Lawson MRN: [id number] Date of Birth: Feb 03, 1961 No data recorded  Encounter Date: 12/04/2017  OT End of Session - 12/04/17 1804    Visit Number  17    Number of Visits  24    Date for OT Re-Evaluation  01/29/18    OT Start Time  1212    OT Stop Time  1309    OT Time Calculation (min)  57 min    Activity Tolerance  Patient tolerated treatment well    Behavior During Therapy  [facility] for tasks assessed/performed       Past Medical History:  Diagnosis Date  . Diabetes mellitus without complication ([place])   . Endometrial adenocarcinoma ([place])   . GERD (gastroesophageal reflux disease)   . Hematochezia   . Hyperlipidemia   . Hypertension   . IDA (iron deficiency anemia)   . Inguinal lymphadenopathy 07/13/2015  . Irritable bowel syndrome   . Obesity   . Osteoarthritis   . Sleep apnea     Past Surgical History:  Procedure Laterality Date  . ABDOMINAL HYSTERECTOMY  feb 2013  . COLONOSCOPY WITH PROPOFOL N/A 08/02/2015   Procedure: COLONOSCOPY WITH PROPOFOL;  Surgeon: Manya Silvas, MD;  Location: [facility] ENDOSCOPY;  Service: Endoscopy;  Laterality: N/A;  . ESOPHAGOGASTRODUODENOSCOPY (EGD) WITH PROPOFOL N/A 08/02/2015   Procedure: ESOPHAGOGASTRODUODENOSCOPY (EGD) WITH PROPOFOL;  Surgeon: Manya Silvas, MD;  Location: [facility] ENDOSCOPY;  Service: Endoscopy;  Laterality: N/A;    There were no vitals filed for this visit.  Subjective Assessment - 12/04/17 1306    Subjective   My MRI was negative for any injury but my pain still can icnrease to 6/10 with using , lifting or gripping - I dropped some thing the other day - but no numbness - fingers just fee ltight at times     Patient Stated Goals  I want to be able to use my hand like before - dressing , bathing ,  cutting food, lift and carry objects     Currently in Pain?  Yes    Pain Score  4     Pain Location  Wrist    Pain Orientation  Left    Pain Descriptors / Indicators  Aching;Tender    Pain Type  Acute pain    Pain Onset  More than a month ago    Pain Frequency  Intermittent         [facility] OT Assessment - 12/04/17 0001      AROM   Left Wrist Extension  62 Degrees    Left Wrist Flexion  75 Degrees    Left Wrist Radial Deviation  25 Degrees    Left Wrist Ulnar Deviation  35 Degrees      Strength   Right Hand Grip (lbs)  35    Right Hand Lateral Pinch  15 lbs    Right Hand 3 Point Pinch  12 lbs    Left Hand Grip (lbs)  28    Left Hand Lateral Pinch  14 lbs    Left Hand 3 Point Pinch  10 lbs      Measurement taken for AROM and grip /prehenison strength  See flow sheet  Increase pain with extention and flexion of wrist   grip decrease 2 lbs and 3 point grip decrease  See flowsheet    tender over dorsal wrist and at times radial and ulnar Edema still present dorsal wrist   Pt change to  Prefab wrist splint on all time for immobilization  contrast 2 x day  And pain free AROM for L wrist in all planes           skin check done prior to ionto -and no issues - pt to wear for hour afterwards  OT Treatments/Exercises (OP) - 12/04/17 0001      Iontophoresis   Type of Iontophoresis  Dexamethasone    Location  dorsal wrist     Dose  med patch     Time  52min      LUE Contrast Bath   Time  11 minutes    Comments  SOC prior to review of HEP and soft tissue       after contrast done some graston tool sweeping over dorsal wrist and forearm  Prior to ionto  And review of HEP        OT Education - 12/04/17 1804    Education provided  Yes    Education Details  change in HEP     Person(s) Educated  Patient   sister   Methods  Explanation;Demonstration;Handout    Comprehension  Returned demonstration;Verbalized understanding       OT Short Term Goals -  12/04/17 1809      OT SHORT TERM GOAL #1   Title  Pain on L wrist decrease to less than 5/10 at rest to wean out of splint     Baseline  still splint needed - pain 2-6/10     Time  3    Period  Weeks    Status  On-going    Target Date  12/25/17      OT SHORT TERM GOAL #2   Title  Pt to be ind in HEP to decrease pain , increase AROM in wrist in all planes and increase grip     Baseline  Pain increase again and decrease AROM and grip / 3 point grip     Time  4    Period  Weeks    Status  On-going    Target Date  01/01/18        OT Long Term Goals - 12/04/17 1810      OT LONG TERM GOAL #1   Title  Pt wrist AROM and strength increase for pt to use L hand in more than 50% of bathing , cut food, turn doorknob, without increase symptoms     Baseline  use it but increase symptoms- pain 3-6/10 at wrist with lifting or gripping    Time  6    Period  Weeks    Status  On-going    Target Date  01/15/18      OT LONG TERM GOAL #2   Title  L grip  increase with more than 5 lbs and prehension strength increase with more than 3 lbs to use hand in cooking , laudry and cut food     Baseline  L grip 28, R 35, - L 3 point grip decrease to 10 lbs since last taken  - but still not able to cut food and do laundery without symptoms      Time  8    Period  Weeks    Status  On-going    Target Date  01/29/18  Plan - 12/04/17 1805    Clinical Impression Statement   Pt was not seen for 2 wks while she had her MRI done - MRI showed no injury - pt report her swelling on dorsal wrist still present - and then her pain fluctuate from 2-6/10 depending what she do - hard time wiht lifting , gripping , cutting  - pt tender of dorsal wrist but also at times on radial and ulnar side - she did show decrease wrist flexiod increase pain with wrist flexion and extention compare to  2 wks ago - change her to immobilization splint for wrist again and iont with dexamethazone - and pain free advancement  for  ROM and strengtheniing     Occupational performance deficits (Please refer to evaluation for details):  ADL's;IADL's;Play;Leisure    Rehab Potential  Fair    Current Impairments/barriers affecting progress:  co-morbitities     OT Frequency  2x / week   1-2 x wk   OT Duration  8 weeks    OT Treatment/Interventions  Self-care/ADL training;Iontophoresis;Therapeutic exercise;Ultrasound;Contrast Bath;Fluidtherapy;Manual Therapy;Splinting;Patient/family education    Plan  Had wrist pain from fall- and then reinjured it again July 4th - pain since then     Clinical Decision Making  Several treatment options, min-mod task modification necessary    OT Home Exercise Plan  See pt instruction    Consulted and Agree with Plan of Care  Patient       Patient will benefit from skilled therapeutic intervention in order to improve the following deficits and impairments:  Pain, Impaired flexibility, Increased edema, Decreased strength, Decreased range of motion, Impaired UE functional use  Visit Diagnosis: Muscle weakness (generalized) - Plan: Ot plan of care cert/re-cert  Pain in left wrist - Plan: Ot plan of care cert/re-cert  Stiffness of left wrist, not elsewhere classified - Plan: Ot plan of care cert/re-cert    Problem List Patient Active Problem List   Diagnosis Date Noted  . Lymphedema 01/17/2016  . Venous (peripheral) insufficiency 01/17/2016  . Pain in limb 01/17/2016  . Iron deficiency anemia due to chronic blood loss 01/09/2016  . History of endometrial cancer 08/01/2015  . Endometrial adenocarcinoma ([place]) 07/13/2015    Class: History of  . Inguinal lymphadenopathy 07/13/2015  . Long term current use of insulin ([place]) 12/12/2013  . Microalbuminuria 12/12/2013  . Diabetes ([place]) 07/28/2013  . BP (high blood pressure) 07/28/2013  . HLD (hyperlipidemia) 07/28/2013  . Adiposity 07/28/2013  . Obstructive apnea 07/28/2013  . Arthritis, degenerative 07/28/2013  . Apnea, sleep 07/28/2013   . Thyroid nodule 07/28/2013    Rosalyn Gess OTR/L,CLT 12/04/2017, 6:17 PM  [facility] [address], [place], [address] Phone: [phone]   Fax:  [phone]  Name: Christine Lawson MRN: [id number] Date of Birth: [birth year]

## 2017-12-08 ENCOUNTER — Ambulatory Visit: Payer: Medicare Other | Admitting: Occupational Therapy

## 2017-12-08 DIAGNOSIS — M6281 Muscle weakness (generalized): Secondary | ICD-10-CM

## 2017-12-08 DIAGNOSIS — M25632 Stiffness of left wrist, not elsewhere classified: Secondary | ICD-10-CM

## 2017-12-08 DIAGNOSIS — M25532 Pain in left wrist: Secondary | ICD-10-CM

## 2017-12-08 NOTE — Patient Instructions (Signed)
Same - but do isotoner glove  For day and night time   under wrist splint

## 2017-12-08 NOTE — Therapy (Signed)
[facility] [address] [place], [address] Phone: [phone]   Fax:  [phone]  Occupational Therapy Treatment  Patient Details  Name: Christine Lawson MRN: [id number] Date of Birth: 1960-06-28 No data recorded  Encounter Date: 12/08/2017  OT End of Session - 12/08/17 1231    Visit Number  18    Number of Visits  24    Date for OT Re-Evaluation  01/29/18    OT Start Time  1155    OT Stop Time  1247    OT Time Calculation (min)  52 min    Activity Tolerance  Patient tolerated treatment well    Behavior During Therapy  [facility] for tasks assessed/performed       Past Medical History:  Diagnosis Date  . Diabetes mellitus without complication ([place])   . Endometrial adenocarcinoma ([place])   . GERD (gastroesophageal reflux disease)   . Hematochezia   . Hyperlipidemia   . Hypertension   . IDA (iron deficiency anemia)   . Inguinal lymphadenopathy 07/13/2015  . Irritable bowel syndrome   . Obesity   . Osteoarthritis   . Sleep apnea     Past Surgical History:  Procedure Laterality Date  . ABDOMINAL HYSTERECTOMY  feb 2013  . COLONOSCOPY WITH PROPOFOL N/A 08/02/2015   Procedure: COLONOSCOPY WITH PROPOFOL;  Surgeon: Manya Silvas, MD;  Location: [facility] ENDOSCOPY;  Service: Endoscopy;  Laterality: N/A;  . ESOPHAGOGASTRODUODENOSCOPY (EGD) WITH PROPOFOL N/A 08/02/2015   Procedure: ESOPHAGOGASTRODUODENOSCOPY (EGD) WITH PROPOFOL;  Surgeon: Manya Silvas, MD;  Location: [facility] ENDOSCOPY;  Service: Endoscopy;  Laterality: N/A;    There were no vitals filed for this visit.  Subjective Assessment - 12/08/17 1202    Subjective   I did not do the heat and ice - need to find my heatingpad -  but did find my hard splint - little better maybe the pain     Patient Stated Goals  I want to be able to use my hand like before - dressing , bathing , cutting food, lift and carry objects     Currently in Pain?  Yes    Pain Score   3     Pain Location  Wrist    Pain Orientation  Left    Pain Descriptors / Indicators  Aching;Tender    Pain Type  Acute pain    Pain Onset  More than a month ago    Pain Frequency  Intermittent             AROM at wrist about the same - cont to have pain with wrist flexion and extention over dorsal wrist  Review with pt to do flexion and extention over armrest -and let gravity assist  And cont with RD, UD , sup and pronation - 2-3 x day after contrast  Fitted with isotoner glove to wear under splint - night time and as much as she can during day'  Pt report since last time tightness in digits and feeling it is swollen       OT Treatments/Exercises (OP) - 12/08/17 0001      Iontophoresis   Type of Iontophoresis  Dexamethasone    Location  dorsal wrist     Dose  med patch     Time  84min      LUE Contrast Bath   Time  11 minutes    Comments  SOC to decrease edema and pain  after contrast done some graston tool nr 2 for brushing and sweeping over volar and dorsal wrist /forearm and palm  Prior to ionto - skin check done prior - no issues   pt to keep patch on for hour afterwards        OT Education - 12/08/17 1230    Education provided  Yes    Education Details  splint wearing - doing contrast - and add isotoner glove underneath     Person(s) Educated  Patient    Methods  Explanation;Demonstration;Handout    Comprehension  Returned demonstration;Verbalized understanding       OT Short Term Goals - 12/04/17 1809      OT SHORT TERM GOAL #1   Title  Pain on L wrist decrease to less than 5/10 at rest to wean out of splint     Baseline  still splint needed - pain 2-6/10     Time  3    Period  Weeks    Status  On-going    Target Date  12/25/17      OT SHORT TERM GOAL #2   Title  Pt to be ind in HEP to decrease pain , increase AROM in wrist in all planes and increase grip     Baseline  Pain increase again and decrease AROM and grip / 3 point grip      Time  4    Period  Weeks    Status  On-going    Target Date  01/01/18        OT Long Term Goals - 12/04/17 1810      OT LONG TERM GOAL #1   Title  Pt wrist AROM and strength increase for pt to use L hand in more than 50% of bathing , cut food, turn doorknob, without increase symptoms     Baseline  use it but increase symptoms- pain 3-6/10 at wrist with lifting or gripping    Time  6    Period  Weeks    Status  On-going    Target Date  01/15/18      OT LONG TERM GOAL #2   Title  L grip  increase with more than 5 lbs and prehension strength increase with more than 3 lbs to use hand in cooking , laudry and cut food     Baseline  L grip 28, R 35, - L 3 point grip decrease to 10 lbs since last taken  - but still not able to cut food and do laundery without symptoms      Time  8    Period  Weeks    Status  On-going    Target Date  01/29/18            Plan - 12/08/17 1233    Clinical Impression Statement  Pt cont to have pain over dorsal wrist with wrist flexion and extention mostly and tenderness- done this date 2nd session of ionto and pt to cont with wrist splint but add this date isotonter glove to wear as much as she can for compression at wrist but also on fingers- pt report since last time some tightness feeling in digits     Occupational performance deficits (Please refer to evaluation for details):  ADL's;IADL's;Play;Leisure    Rehab Potential  Fair    Current Impairments/barriers affecting progress:  co-morbitities     OT Frequency  2x / week   1-2x wks   OT Duration  8 weeks  OT Treatment/Interventions  Self-care/ADL training;Iontophoresis;Therapeutic exercise;Ultrasound;Contrast Bath;Fluidtherapy;Manual Therapy;Splinting;Patient/family education    Plan  Had wrist pain from fall- and then reinjured it again July 4th - pain since then     Clinical Decision Making  Several treatment options, min-mod task modification necessary    OT Home Exercise Plan  See pt  instruction    Consulted and Agree with Plan of Care  Patient       Patient will benefit from skilled therapeutic intervention in order to improve the following deficits and impairments:  Pain, Impaired flexibility, Increased edema, Decreased strength, Decreased range of motion, Impaired UE functional use  Visit Diagnosis: Muscle weakness (generalized)  Pain in left wrist  Stiffness of left wrist, not elsewhere classified    Problem List Patient Active Problem List   Diagnosis Date Noted  . Lymphedema 01/17/2016  . Venous (peripheral) insufficiency 01/17/2016  . Pain in limb 01/17/2016  . Iron deficiency anemia due to chronic blood loss 01/09/2016  . History of endometrial cancer 08/01/2015  . Endometrial adenocarcinoma ([place]) 07/13/2015    Class: History of  . Inguinal lymphadenopathy 07/13/2015  . Long term current use of insulin ([place]) 12/12/2013  . Microalbuminuria 12/12/2013  . Diabetes ([place]) 07/28/2013  . BP (high blood pressure) 07/28/2013  . HLD (hyperlipidemia) 07/28/2013  . Adiposity 07/28/2013  . Obstructive apnea 07/28/2013  . Arthritis, degenerative 07/28/2013  . Apnea, sleep 07/28/2013  . Thyroid nodule 07/28/2013    Rosalyn Gess OTR/L,CLT 12/08/2017, 7:01 PM   [facility] [address], [place], [address] Phone: [phone]   Fax:  [phone]  Name: Avah Bashor MRN: [id number] Date of Birth: [birth year]

## 2017-12-10 ENCOUNTER — Ambulatory Visit: Payer: Medicare Other | Admitting: Occupational Therapy

## 2017-12-10 DIAGNOSIS — M25632 Stiffness of left wrist, not elsewhere classified: Secondary | ICD-10-CM

## 2017-12-10 DIAGNOSIS — M6281 Muscle weakness (generalized): Secondary | ICD-10-CM | POA: Diagnosis not present

## 2017-12-10 DIAGNOSIS — M25532 Pain in left wrist: Secondary | ICD-10-CM

## 2017-12-10 NOTE — Patient Instructions (Signed)
Same but kinesiotape on dorsal wrist to forearm

## 2017-12-10 NOTE — Therapy (Signed)
[facility] [address] [place], [address] Phone: [phone]   Fax:  [phone]  Occupational Therapy Treatment  Patient Details  Name: Christine Lawson MRN: [id number] Date of Birth: [birth year] No data recorded  Encounter Date: 12/10/2017  OT End of Session - 12/10/17 1339    Visit Number  19    Number of Visits  24    Date for OT Re-Evaluation  01/29/18    OT Start Time  1329    OT Stop Time  1419    OT Time Calculation (min)  50 min    Activity Tolerance  Patient tolerated treatment well    Behavior During Therapy  [facility] for tasks assessed/performed       Past Medical History:  Diagnosis Date  . Diabetes mellitus without complication ([place])   . Endometrial adenocarcinoma ([place])   . GERD (gastroesophageal reflux disease)   . Hematochezia   . Hyperlipidemia   . Hypertension   . IDA (iron deficiency anemia)   . Inguinal lymphadenopathy 07/13/2015  . Irritable bowel syndrome   . Obesity   . Osteoarthritis   . Sleep apnea     Past Surgical History:  Procedure Laterality Date  . ABDOMINAL HYSTERECTOMY  feb 2013  . COLONOSCOPY WITH PROPOFOL N/A 08/02/2015   Procedure: COLONOSCOPY WITH PROPOFOL;  Surgeon: Manya Silvas, MD;  Location[facility] ENDOSCOPY;  Service: Endoscopy;  Laterality: N/A;  . ESOPHAGOGASTRODUODENOSCOPY (EGD) WITH PROPOFOL N/A 08/02/2015   Procedure: ESOPHAGOGASTRODUODENOSCOPY (EGD) WITH PROPOFOL;  Surgeon: Manya Silvas, MD;  Location: [facility] ENDOSCOPY;  Service: Endoscopy;  Laterality: N/A;    There were no vitals filed for this visit.  Subjective Assessment - 12/10/17 1337    Subjective   My wrist did bother me some today more than usual - about 3-4/10 - but I did do my exercises  and wear my glove and brace- fingers did not feel as tight with glove     Patient Stated Goals  I want to be able to use my hand like before - dressing , bathing , cutting food, lift and carry objects      Currently in Pain?  Yes    Pain Score  4     Pain Location  Wrist    Pain Orientation  Left    Pain Descriptors / Indicators  Sore    Pain Onset  More than a month ago    Pain Frequency  Intermittent           AROM at wrist for flexion and extention improve - cont to have pain with wrist flexion and extention over dorsal wrist  Review with pt to do flexion and extention over armrest -and let gravity assist  - same as last time  And cont with RD, UD , sup and pronation - 2-3 x day after contrast          OT Treatments/Exercises (OP) - 12/10/17 0001      LUE Contrast Bath   Time  11 minutes    Comments  SOC to decrease edema and pain         after contrast done some graston tool nr 2 for brushing and sweeping over volar and dorsal wrist /forearm and palm  - tender over dorsal wrist - where area of swelling is   Prior to ionto - skin check done prior - no issues  Removed patch this date and wash And apply and ed pt  and sister how to replace kinesiotape - 3 fingers over dorsal hand with achor on proximal forearm         OT Education - 12/10/17 1339    Education provided  Yes    Education Details  splint wearing - doing contrast - cont with HEP and  isotoner glove underneath  and taping to dorsal wrist    Person(s) Educated  Patient    Methods  Explanation;Demonstration;Handout    Comprehension  Returned demonstration;Verbalized understanding       OT Short Term Goals - 12/04/17 1809      OT SHORT TERM GOAL #1   Title  Pain on L wrist decrease to less than 5/10 at rest to wean out of splint     Baseline  still splint needed - pain 2-6/10     Time  3    Period  Weeks    Status  On-going    Target Date  12/25/17      OT SHORT TERM GOAL #2   Title  Pt to be ind in HEP to decrease pain , increase AROM in wrist in all planes and increase grip     Baseline  Pain increase again and decrease AROM and grip / 3 point grip     Time  4    Period  Weeks    Status   On-going    Target Date  01/01/18        OT Long Term Goals - 12/04/17 1810      OT LONG TERM GOAL #1   Title  Pt wrist AROM and strength increase for pt to use L hand in more than 50% of bathing , cut food, turn doorknob, without increase symptoms     Baseline  use it but increase symptoms- pain 3-6/10 at wrist with lifting or gripping    Time  6    Period  Weeks    Status  On-going    Target Date  01/15/18      OT LONG TERM GOAL #2   Title  L grip  increase with more than 5 lbs and prehension strength increase with more than 3 lbs to use hand in cooking , laudry and cut food     Baseline  L grip 28, R 35, - L 3 point grip decrease to 10 lbs since last taken  - but still not able to cut food and do laundery without symptoms      Time  8    Period  Weeks    Status  On-going    Target Date  01/29/18            Plan - 12/10/17 1340    Clinical Impression Statement  Pt  cont to show pocket of increase swelling over dorsal wrist - and tenderness - but MRI was negative for injury - pt AROM did increase for flexion and extention - add this date some kinesiotape to dorsal wrist and forearm - and provided xtra to replace  over the weekend     Occupational performance deficits (Please refer to evaluation for details):  ADL's;IADL's;Play;Leisure    Rehab Potential  Fair    Current Impairments/barriers affecting progress:  co-morbitities     OT Frequency  2x / week    OT Duration  8 weeks    OT Treatment/Interventions  Self-care/ADL training;Iontophoresis;Therapeutic exercise;Ultrasound;Contrast Bath;Fluidtherapy;Manual Therapy;Splinting;Patient/family education    Plan  assess progress with taping and had 3rd session of ionto  Clinical Decision Making  Several treatment options, min-mod task modification necessary    OT Home Exercise Plan  See pt instruction    Consulted and Agree with Plan of Care  Patient       Patient will benefit from skilled therapeutic intervention in  order to improve the following deficits and impairments:  Pain, Impaired flexibility, Increased edema, Decreased strength, Decreased range of motion, Impaired UE functional use  Visit Diagnosis: Muscle weakness (generalized)  Pain in left wrist  Stiffness of left wrist, not elsewhere classified    Problem List Patient Active Problem List   Diagnosis Date Noted  . Lymphedema 01/17/2016  . Venous (peripheral) insufficiency 01/17/2016  . Pain in limb 01/17/2016  . Iron deficiency anemia due to chronic blood loss 01/09/2016  . History of endometrial cancer 08/01/2015  . Endometrial adenocarcinoma ([place]) 07/13/2015    Class: History of  . Inguinal lymphadenopathy 07/13/2015  . Long term current use of insulin ([place]) 12/12/2013  . Microalbuminuria 12/12/2013  . Diabetes ([place]) 07/28/2013  . BP (high blood pressure) 07/28/2013  . HLD (hyperlipidemia) 07/28/2013  . Adiposity 07/28/2013  . Obstructive apnea 07/28/2013  . Arthritis, degenerative 07/28/2013  . Apnea, sleep 07/28/2013  . Thyroid nodule 07/28/2013    Rosalyn Gess OTR/L,CLT 12/10/2017, 7:36 PM  [place] [facility] [address], [place], [address] Phone: [phone]   Fax:  [phone]  Name: Lianni Kanaan MRN: [id number] Date of Birth: 07-28-1960

## 2017-12-15 ENCOUNTER — Ambulatory Visit: Payer: Medicare Other | Admitting: Occupational Therapy

## 2017-12-15 DIAGNOSIS — M6281 Muscle weakness (generalized): Secondary | ICD-10-CM | POA: Diagnosis not present

## 2017-12-15 DIAGNOSIS — M25632 Stiffness of left wrist, not elsewhere classified: Secondary | ICD-10-CM

## 2017-12-15 DIAGNOSIS — M25532 Pain in left wrist: Secondary | ICD-10-CM

## 2017-12-15 NOTE — Therapy (Signed)
[place] [facility] [address] [place], [address] Phone: [phone]   Fax:  [phone]  Occupational Therapy Treatment  Patient Details  Name: Christine Lawson MRN: [id number] Date of Birth: 07/26/1960 No data recorded  Encounter Date: 12/15/2017  OT End of Session - 12/15/17 1124    Visit Number  20    Number of Visits  24    Date for OT Re-Evaluation  01/29/18    OT Start Time  1112    OT Stop Time  1213    OT Time Calculation (min)  61 min    Activity Tolerance  Patient tolerated treatment well    Behavior During Therapy  [facility] for tasks assessed/performed       Past Medical History:  Diagnosis Date  . Diabetes mellitus without complication ([place])   . Endometrial adenocarcinoma ([place])   . GERD (gastroesophageal reflux disease)   . Hematochezia   . Hyperlipidemia   . Hypertension   . IDA (iron deficiency anemia)   . Inguinal lymphadenopathy 07/13/2015  . Irritable bowel syndrome   . Obesity   . Osteoarthritis   . Sleep apnea     Past Surgical History:  Procedure Laterality Date  . ABDOMINAL HYSTERECTOMY  feb 2013  . COLONOSCOPY WITH PROPOFOL N/A 08/02/2015   Procedure: COLONOSCOPY WITH PROPOFOL;  Surgeon: Manya Silvas, MD;  Location: [facility] ENDOSCOPY;  Service: Endoscopy;  Laterality: N/A;  . ESOPHAGOGASTRODUODENOSCOPY (EGD) WITH PROPOFOL N/A 08/02/2015   Procedure: ESOPHAGOGASTRODUODENOSCOPY (EGD) WITH PROPOFOL;  Surgeon: Manya Silvas, MD;  Location: [facility] ENDOSCOPY;  Service: Endoscopy;  Laterality: N/A;    There were no vitals filed for this visit.  Subjective Assessment - 12/15/17 1114    Subjective   I did not do the tape on the arm - because it folded and did not want to stick - I did not hold my phone - The glove is helping because I can use my fingers or thumb is better - not as tight     Patient Stated Goals  I want to be able to use my hand like before - dressing , bathing , cutting  food, lift and carry objects     Currently in Pain?  Yes    Pain Score  4     Pain Location  Wrist    Pain Orientation  Left    Pain Descriptors / Indicators  Aching         [facility] OT Assessment - 12/15/17 0001      AROM   Left Wrist Extension  60 Degrees    Left Wrist Flexion  85 Degrees      Strength   Left Hand Grip (lbs)  28    Left Hand Lateral Pinch  14 lbs    Left Hand 3 Point Pinch  12 lbs       AROM at wrist for flexion  improve - cont to have pain with wrist flexion and extention over dorsal wrist  Review with pt to do flexion and extention over armrest -and let gravity assist  -And cont with RD, UD , sup and pronation  AAROM done with pt - 2-3 x day after contrast    assess grip and prehension strength - see flowsheet  pt do report that her thumb and digits feels less tight and swollen       OT Treatments/Exercises (OP) - 12/15/17 0001      Iontophoresis   Type of  Iontophoresis  Dexamethasone    Location  dorsal wrist     Dose  med patch     Time  19      LUE Contrast Bath   Time  11 minutes    Comments  SOC to decrease edema and pain         after contrast done some graston tool nr 2 for brushing and sweeping over volar and dorsal wrist /forearm and palm  - tender over dorsal  And volar forearm - multiple trigger points and tender- not responding on soft tissue- ask PT to assess if can benefit from dry needling -will ask Mia Creek if can do  MRI was clear - no injuries  Cont to have swelling on dorsal wrist    Prior to ionto - skin check done- no issues  Removed patch this date and wash And apply and ed pt and sister how to replace kinesiotape - 3 fingers over dorsal hand with achor on proximal forearm        OT Education - 12/15/17 1123    Education provided  Yes    Education Details  splint wearing - doing contrast - cont with HEP and  isotoner glove underneath  and taping reviewed again  to dorsal wrist    Person(s) Educated  Patient     Methods  Explanation;Demonstration;Handout    Comprehension  Returned demonstration;Verbalized understanding       OT Short Term Goals - 12/04/17 1809      OT SHORT TERM GOAL #1   Title  Pain on L wrist decrease to less than 5/10 at rest to wean out of splint     Baseline  still splint needed - pain 2-6/10     Time  3    Period  Weeks    Status  On-going    Target Date  12/25/17      OT SHORT TERM GOAL #2   Title  Pt to be ind in HEP to decrease pain , increase AROM in wrist in all planes and increase grip     Baseline  Pain increase again and decrease AROM and grip / 3 point grip     Time  4    Period  Weeks    Status  On-going    Target Date  01/01/18        OT Long Term Goals - 12/04/17 1810      OT LONG TERM GOAL #1   Title  Pt wrist AROM and strength increase for pt to use L hand in more than 50% of bathing , cut food, turn doorknob, without increase symptoms     Baseline  use it but increase symptoms- pain 3-6/10 at wrist with lifting or gripping    Time  6    Period  Weeks    Status  On-going    Target Date  01/15/18      OT LONG TERM GOAL #2   Title  L grip  increase with more than 5 lbs and prehension strength increase with more than 3 lbs to use hand in cooking , laudry and cut food     Baseline  L grip 28, R 35, - L 3 point grip decrease to 10 lbs since last taken  - but still not able to cut food and do laundery without symptoms      Time  8    Period  Weeks    Status  On-going    Target  Date  01/29/18            Plan - 12/15/17 1124    Clinical Impression Statement  Pt  cont to show increase pain over dorsal wrist and forearm - trigger points on volar and dorsal forearm -  that do not respond on graston tool - ask for consult with PT for dryneedling - will ask Mia Creek - her PA  to approval - pt to cont wiith graston , taping ,  splint and isotoner glove     Occupational performance deficits (Please refer to evaluation for details):   ADL's;IADL's;Play;Leisure    Rehab Potential  Fair    Current Impairments/barriers affecting progress:  co-morbitities     OT Frequency  2x / week    OT Duration  6 weeks    OT Treatment/Interventions  Self-care/ADL training;Iontophoresis;Therapeutic exercise;Ultrasound;Contrast Bath;Fluidtherapy;Manual Therapy;Splinting;Patient/family education    Plan  assess progress with taping and had 3rd session of ionto     Clinical Decision Making  Several treatment options, min-mod task modification necessary    OT Home Exercise Plan  See pt instruction    Consulted and Agree with Plan of Care  Patient       Patient will benefit from skilled therapeutic intervention in order to improve the following deficits and impairments:  Pain, Impaired flexibility, Increased edema, Decreased strength, Decreased range of motion, Impaired UE functional use  Visit Diagnosis: Muscle weakness (generalized)  Stiffness of left wrist, not elsewhere classified  Pain in left wrist    Problem List Patient Active Problem List   Diagnosis Date Noted  . Lymphedema 01/17/2016  . Venous (peripheral) insufficiency 01/17/2016  . Pain in limb 01/17/2016  . Iron deficiency anemia due to chronic blood loss 01/09/2016  . History of endometrial cancer 08/01/2015  . Endometrial adenocarcinoma ([place]) 07/13/2015    Class: History of  . Inguinal lymphadenopathy 07/13/2015  . Long term current use of insulin ([place]) 12/12/2013  . Microalbuminuria 12/12/2013  . Diabetes ([place]) 07/28/2013  . BP (high blood pressure) 07/28/2013  . HLD (hyperlipidemia) 07/28/2013  . Adiposity 07/28/2013  . Obstructive apnea 07/28/2013  . Arthritis, degenerative 07/28/2013  . Apnea, sleep 07/28/2013  . Thyroid nodule 07/28/2013    Rosalyn Gess OTR/L,CLT 12/15/2017, 12:15 PM  [place] [facility] [address], [place], [address] Phone: [phone]   Fax:   [phone]  Name: Mazie Fencl MRN: [id number] Date of Birth: 11-Aug-1960

## 2017-12-17 ENCOUNTER — Ambulatory Visit: Payer: Medicare Other | Admitting: Occupational Therapy

## 2017-12-17 DIAGNOSIS — M6281 Muscle weakness (generalized): Secondary | ICD-10-CM | POA: Diagnosis not present

## 2017-12-17 DIAGNOSIS — M25632 Stiffness of left wrist, not elsewhere classified: Secondary | ICD-10-CM

## 2017-12-17 DIAGNOSIS — M25532 Pain in left wrist: Secondary | ICD-10-CM

## 2017-12-17 NOTE — Patient Instructions (Signed)
Same as last time- taping - and massage volar forearm and wrist

## 2017-12-17 NOTE — Therapy (Signed)
[place] [facility] [address], [place], [address] Phone: [phone]   Fax:  [phone]  Occupational Therapy Treatment  Patient Details  Name: Christine Lawson MRN: [id number] Date of Birth: January 26, 1961 No data recorded  Encounter Date: 12/17/2017  OT End of Session - 12/17/17 1317    Visit Number  21    Number of Visits  24    Date for OT Re-Evaluation  01/29/18    OT Start Time  1324    OT Stop Time  1350    OT Time Calculation (min)  45 min    Activity Tolerance  Patient tolerated treatment well    Behavior During Therapy  [facility] for tasks assessed/performed       Past Medical History:  Diagnosis Date  . Diabetes mellitus without complication ([place])   . Endometrial adenocarcinoma ([place])   . GERD (gastroesophageal reflux disease)   . Hematochezia   . Hyperlipidemia   . Hypertension   . IDA (iron deficiency anemia)   . Inguinal lymphadenopathy 07/13/2015  . Irritable bowel syndrome   . Obesity   . Osteoarthritis   . Sleep apnea     Past Surgical History:  Procedure Laterality Date  . ABDOMINAL HYSTERECTOMY  feb 2013  . COLONOSCOPY WITH PROPOFOL N/A 08/02/2015   Procedure: COLONOSCOPY WITH PROPOFOL;  Surgeon: Manya Silvas, MD;  Location: [facility] ENDOSCOPY;  Service: Endoscopy;  Laterality: N/A;  . ESOPHAGOGASTRODUODENOSCOPY (EGD) WITH PROPOFOL N/A 08/02/2015   Procedure: ESOPHAGOGASTRODUODENOSCOPY (EGD) WITH PROPOFOL;  Surgeon: Manya Silvas, MD;  Location: [facility] ENDOSCOPY;  Service: Endoscopy;  Laterality: N/A;    There were no vitals filed for this visit.  Subjective Assessment - 12/17/17 1312    Subjective   I did do the contrast - can you show my sister how to do the massage on my forearm and the taping     Patient Stated Goals  I want to be able to use my hand like before - dressing , bathing , cutting food, lift and carry objects     Currently in Pain?  Yes    Pain Score  4     Pain Location   Wrist    Pain Orientation  Left    Pain Descriptors / Indicators  Aching    Pain Type  Acute pain    Pain Onset  More than a month ago                   OT Treatments/Exercises (OP) - 12/17/17 0001      Iontophoresis   Type of Iontophoresis  Dexamethasone    Location  volar proximal forearm     Dose  med patch     Time  19      LUE Contrast Bath   Time  11 minutes    Comments  SOC prior to soft tissue        after contrast done some graston tool nr 2 for brushing and sweeping over volar and dorsal wrist /forearm and palm- tender over dorsal  And volar forearm - multiple trigger points and tender- not responding on soft tissue- ask PT to assess if can benefit from dry needling -will ask Mia Creek if can do  - PT on vacation next week - she is familiar with pt  MRI was clear - no injuries  Cont to have swelling on dorsal wrist   Prior to ionto - skin check done- no issues Removed  patch this date and wash And apply and ed pt and sister how to replace kinesiotape - had sister do it in clinic   3 fingers over dorsal hand with achor on proximal forearm  And pt and sister to do massage to forearm at home over weekend        OT Education - 12/17/17 1317    Education provided  Yes    Education Details  splint wearing - doing contrast - massage forearm and taping     Person(s) Educated  Patient    Methods  Explanation;Demonstration;Handout    Comprehension  Returned demonstration;Verbalized understanding       OT Short Term Goals - 12/04/17 1809      OT SHORT TERM GOAL #1   Title  Pain on L wrist decrease to less than 5/10 at rest to wean out of splint     Baseline  still splint needed - pain 2-6/10     Time  3    Period  Weeks    Status  On-going    Target Date  12/25/17      OT SHORT TERM GOAL #2   Title  Pt to be ind in HEP to decrease pain , increase AROM in wrist in all planes and increase grip     Baseline  Pain increase again and decrease AROM and  grip / 3 point grip     Time  4    Period  Weeks    Status  On-going    Target Date  01/01/18        OT Long Term Goals - 12/04/17 1810      OT LONG TERM GOAL #1   Title  Pt wrist AROM and strength increase for pt to use L hand in more than 50% of bathing , cut food, turn doorknob, without increase symptoms     Baseline  use it but increase symptoms- pain 3-6/10 at wrist with lifting or gripping    Time  6    Period  Weeks    Status  On-going    Target Date  01/15/18      OT LONG TERM GOAL #2   Title  L grip  increase with more than 5 lbs and prehension strength increase with more than 3 lbs to use hand in cooking , laudry and cut food     Baseline  L grip 28, R 35, - L 3 point grip decrease to 10 lbs since last taken  - but still not able to cut food and do laundery without symptoms      Time  8    Period  Weeks    Status  On-going    Target Date  01/29/18            Plan - 12/17/17 1318    Clinical Impression Statement  Pt cont to have trigger points on volar and dorsal proximal forearm - pt do not tolerate graston tool nr 2- pt and sister ed on doing some massage over the weekend - and will ask PA for order for dryneedle  when PT comes back     Occupational performance deficits (Please refer to evaluation for details):  ADL's;IADL's;Play;Leisure    Rehab Potential  Fair    OT Frequency  2x / week    OT Duration  6 weeks    OT Treatment/Interventions  Self-care/ADL training;Iontophoresis;Therapeutic exercise;Ultrasound;Contrast Bath;Fluidtherapy;Manual Therapy;Splinting;Patient/family education    Plan  assess progress with taping and massage  Clinical Decision Making  Several treatment options, min-mod task modification necessary    OT Home Exercise Plan  See pt instruction    Consulted and Agree with Plan of Care  Patient       Patient will benefit from skilled therapeutic intervention in order to improve the following deficits and impairments:  Pain, Impaired  flexibility, Increased edema, Decreased strength, Decreased range of motion, Impaired UE functional use  Visit Diagnosis: Muscle weakness (generalized)  Stiffness of left wrist, not elsewhere classified  Pain in left wrist    Problem List Patient Active Problem List   Diagnosis Date Noted  . Lymphedema 01/17/2016  . Venous (peripheral) insufficiency 01/17/2016  . Pain in limb 01/17/2016  . Iron deficiency anemia due to chronic blood loss 01/09/2016  . History of endometrial cancer 08/01/2015  . Endometrial adenocarcinoma ([place]) 07/13/2015    Class: History of  . Inguinal lymphadenopathy 07/13/2015  . Long term current use of insulin ([place]) 12/12/2013  . Microalbuminuria 12/12/2013  . Diabetes ([place]) 07/28/2013  . BP (high blood pressure) 07/28/2013  . HLD (hyperlipidemia) 07/28/2013  . Adiposity 07/28/2013  . Obstructive apnea 07/28/2013  . Arthritis, degenerative 07/28/2013  . Apnea, sleep 07/28/2013  . Thyroid nodule 07/28/2013    Rosalyn Gess OTR/L,CLT 12/17/2017, 6:13 PM  [place] [facility] [address] [place], [address] Phone: [phone]   Fax:  [phone]  Name: Christine Lawson MRN: [id number] Date of Birth: 1960/04/21

## 2017-12-22 ENCOUNTER — Ambulatory Visit: Payer: Medicare Other | Admitting: Occupational Therapy

## 2017-12-22 DIAGNOSIS — M25532 Pain in left wrist: Secondary | ICD-10-CM

## 2017-12-22 DIAGNOSIS — M25632 Stiffness of left wrist, not elsewhere classified: Secondary | ICD-10-CM

## 2017-12-22 DIAGNOSIS — M6281 Muscle weakness (generalized): Secondary | ICD-10-CM

## 2017-12-22 NOTE — Therapy (Signed)
[place] [facility] [address], [place], [address] Phone: [phone]   Fax:  [phone]  Occupational Therapy Treatment  Patient Details  Name: Christine Lawson MRN: [id number] Date of Birth: April 23, 1960 No data recorded  Encounter Date: 12/22/2017  OT End of Session - 12/22/17 1713    Visit Number  22    Number of Visits  24    Date for OT Re-Evaluation  01/29/18    OT Start Time  1100    OT Stop Time  1146    OT Time Calculation (min)  46 min    Activity Tolerance  Patient tolerated treatment well    Behavior During Therapy  [facility] for tasks assessed/performed       Past Medical History:  Diagnosis Date  . Diabetes mellitus without complication ([place])   . Endometrial adenocarcinoma ([place])   . GERD (gastroesophageal reflux disease)   . Hematochezia   . Hyperlipidemia   . Hypertension   . IDA (iron deficiency anemia)   . Inguinal lymphadenopathy 07/13/2015  . Irritable bowel syndrome   . Obesity   . Osteoarthritis   . Sleep apnea     Past Surgical History:  Procedure Laterality Date  . ABDOMINAL HYSTERECTOMY  feb 2013  . COLONOSCOPY WITH PROPOFOL N/A 08/02/2015   Procedure: COLONOSCOPY WITH PROPOFOL;  Surgeon: Manya Silvas, MD;  Location: [facility] ENDOSCOPY;  Service: Endoscopy;  Laterality: N/A;  . ESOPHAGOGASTRODUODENOSCOPY (EGD) WITH PROPOFOL N/A 08/02/2015   Procedure: ESOPHAGOGASTRODUODENOSCOPY (EGD) WITH PROPOFOL;  Surgeon: Manya Silvas, MD;  Location: [facility] ENDOSCOPY;  Service: Endoscopy;  Laterality: N/A;    There were no vitals filed for this visit.  Subjective Assessment - 12/22/17 1711    Subjective   The pain in my wrist about the same - with this rainy weather today- was down to 2-3/10 over weekend - I did do some massage to my forearm     Patient Stated Goals  I want to be able to use my hand like before - dressing , bathing , cutting food, lift and carry objects     Currently in Pain?   Yes    Pain Score  4     Pain Location  Wrist    Pain Orientation  Left    Pain Descriptors / Indicators  Aching    Pain Type  Acute pain    Pain Onset  More than a month ago                   OT Treatments/Exercises (OP) - 12/22/17 0001      Iontophoresis   Type of Iontophoresis  Dexamethasone    Location  dorsal wrist     Dose  med patch     Time  19      LUE Contrast Bath   Time  11 minutes    Comments  prior to soft tissue to forearm and wrist        after contrast done some graston tool nr 2 for brushing and sweeping over volar and dorsal wrist /forearm and palm- tender over dorsalAnd volar forearm - multiple trigger points and tender- not responding on soft tissue- ask PT to assess if can benefit from dry needling -will ask Mia Creek if can do  - PT on vacation this  week - she is familiar with pt  MRI was clear - no injuries  Cont to have swelling on dorsal wrist  Prior to ionto -  skin check done- no issues  And pt and sister to do massage to forearm at home        OT Education - 12/22/17 1712    Education provided  Yes    Education Details  splint wearing - doing contrast - massage forearm and taping     Person(s) Educated  Patient    Methods  Explanation;Demonstration;Handout    Comprehension  Returned demonstration;Verbalized understanding       OT Short Term Goals - 12/04/17 1809      OT SHORT TERM GOAL #1   Title  Pain on L wrist decrease to less than 5/10 at rest to wean out of splint     Baseline  still splint needed - pain 2-6/10     Time  3    Period  Weeks    Status  On-going    Target Date  12/25/17      OT SHORT TERM GOAL #2   Title  Pt to be ind in HEP to decrease pain , increase AROM in wrist in all planes and increase grip     Baseline  Pain increase again and decrease AROM and grip / 3 point grip     Time  4    Period  Weeks    Status  On-going    Target Date  01/01/18        OT Long Term Goals - 12/04/17 1810       OT LONG TERM GOAL #1   Title  Pt wrist AROM and strength increase for pt to use L hand in more than 50% of bathing , cut food, turn doorknob, without increase symptoms     Baseline  use it but increase symptoms- pain 3-6/10 at wrist with lifting or gripping    Time  6    Period  Weeks    Status  On-going    Target Date  01/15/18      OT LONG TERM GOAL #2   Title  L grip  increase with more than 5 lbs and prehension strength increase with more than 3 lbs to use hand in cooking , laudry and cut food     Baseline  L grip 28, R 35, - L 3 point grip decrease to 10 lbs since last taken  - but still not able to cut food and do laundery without symptoms      Time  8    Period  Weeks    Status  On-going    Target Date  01/29/18            Plan - 12/22/17 1714    Clinical Impression Statement  Pt cont to have trigger points on volar and dorsal proximal forearm - pt do not tolerate graston tool nr 2- pt and sister ed on doing some massage- and will ask PA for order for dryneedle  when PT comes back - no improvment in  pain     Occupational performance deficits (Please refer to evaluation for details):  ADL's;IADL's;Play;Leisure    Rehab Potential  Fair    Current Impairments/barriers affecting progress:  co-morbitities     OT Frequency  2x / week    OT Duration  4 weeks    OT Treatment/Interventions  Self-care/ADL training;Iontophoresis;Therapeutic exercise;Ultrasound;Contrast Bath;Fluidtherapy;Manual Therapy;Splinting;Patient/family education    Plan  assess progress with taping and massage    Clinical Decision Making  Several treatment options, min-mod task modification necessary    OT Home Exercise  Plan  See pt instruction    Consulted and Agree with Plan of Care  Patient       Patient will benefit from skilled therapeutic intervention in order to improve the following deficits and impairments:  Pain, Impaired flexibility, Increased edema, Decreased strength, Decreased range of  motion, Impaired UE functional use  Visit Diagnosis: Muscle weakness (generalized)  Stiffness of left wrist, not elsewhere classified  Pain in left wrist    Problem List Patient Active Problem List   Diagnosis Date Noted  . Lymphedema 01/17/2016  . Venous (peripheral) insufficiency 01/17/2016  . Pain in limb 01/17/2016  . Iron deficiency anemia due to chronic blood loss 01/09/2016  . History of endometrial cancer 08/01/2015  . Endometrial adenocarcinoma ([place]) 07/13/2015    Class: History of  . Inguinal lymphadenopathy 07/13/2015  . Long term current use of insulin ([place]) 12/12/2013  . Microalbuminuria 12/12/2013  . Diabetes ([place]) 07/28/2013  . BP (high blood pressure) 07/28/2013  . HLD (hyperlipidemia) 07/28/2013  . Adiposity 07/28/2013  . Obstructive apnea 07/28/2013  . Arthritis, degenerative 07/28/2013  . Apnea, sleep 07/28/2013  . Thyroid nodule 07/28/2013    Rosalyn Gess OTR/L,CLT 12/22/2017, 5:16 PM  [place] [facility] [address], [place], [address] Phone: [phone]   Fax:  [phone]  Name: Christine Lawson MRN: [id number] Date of Birth: 1960-03-21

## 2017-12-24 ENCOUNTER — Ambulatory Visit: Payer: Medicare Other | Admitting: Occupational Therapy

## 2017-12-24 DIAGNOSIS — M25632 Stiffness of left wrist, not elsewhere classified: Secondary | ICD-10-CM

## 2017-12-24 DIAGNOSIS — M6281 Muscle weakness (generalized): Secondary | ICD-10-CM

## 2017-12-24 DIAGNOSIS — M25532 Pain in left wrist: Secondary | ICD-10-CM

## 2017-12-24 NOTE — Patient Instructions (Signed)
Same

## 2017-12-24 NOTE — Therapy (Signed)
[facility] [address] [place], [address] Phone: [phone]   Fax:  [phone]  Occupational Therapy Treatment  Patient Details  Name: Christine Lawson MRN: [id number] Date of Birth: Jul 15, 1960 No data recorded  Encounter Date: 12/24/2017  OT End of Session - 12/24/17 2102    Visit Number  23    Number of Visits  24    Date for OT Re-Evaluation  01/29/18    OT Start Time  1346    OT Stop Time  1445    OT Time Calculation (min)  59 min    Activity Tolerance  Patient tolerated treatment well    Behavior During Therapy  [facility] for tasks assessed/performed       Past Medical History:  Diagnosis Date  . Diabetes mellitus without complication ([place])   . Endometrial adenocarcinoma ([place])   . GERD (gastroesophageal reflux disease)   . Hematochezia   . Hyperlipidemia   . Hypertension   . IDA (iron deficiency anemia)   . Inguinal lymphadenopathy 07/13/2015  . Irritable bowel syndrome   . Obesity   . Osteoarthritis   . Sleep apnea     Past Surgical History:  Procedure Laterality Date  . ABDOMINAL HYSTERECTOMY  feb 2013  . COLONOSCOPY WITH PROPOFOL N/A 08/02/2015   Procedure: COLONOSCOPY WITH PROPOFOL;  Surgeon: Manya Silvas, MD;  Location: [facility] ENDOSCOPY;  Service: Endoscopy;  Laterality: N/A;  . ESOPHAGOGASTRODUODENOSCOPY (EGD) WITH PROPOFOL N/A 08/02/2015   Procedure: ESOPHAGOGASTRODUODENOSCOPY (EGD) WITH PROPOFOL;  Surgeon: Manya Silvas, MD;  Location: [facility] ENDOSCOPY;  Service: Endoscopy;  Laterality: N/A;    There were no vitals filed for this visit.  Subjective Assessment - 12/24/17 1411    Subjective   Pain about the same when I use it - it feels better- but about 4/10 still - wrist still swollen     Patient Stated Goals  I want to be able to use my hand like before - dressing , bathing , cutting food, lift and carry objects     Currently in Pain?  Yes    Pain Score  4     Pain Location   Wrist    Pain Orientation  Left    Pain Descriptors / Indicators  Aching    Pain Type  Chronic pain    Pain Onset  More than a month ago    Pain Frequency  Intermittent       after contrast done some graston tool nr 2 for brushing and sweeping over volar and dorsal wrist /forearm and palm- tender over dorsalAnd volar forearm - multiple trigger points and tender- not responding on soft tissue-  And up into deltoid too  ask PT  Last week to assess if can benefit from dry needling -will ask [place] if can do- PT on vacation this  week - she is familiar with pt MRI was clear - no injuries  Cont to have swelling on dorsal wristand pain with use  initiated this date 1 lbs weight for wrist in all planes -and  Teal putty for gripping   12 reps  1-2 x day   Prior to ionto - skin check done- no issues  And pt and sister to do massage to forearm at home             OT Treatments/Exercises (OP) - 12/24/17 0001      Iontophoresis   Type of Iontophoresis  Dexamethasone  Location  dorsal wrist     Dose  med patch, 2.0 current     Time  19      LUE Contrast Bath   Time  11 minutes    Comments  prior to soft tissue massage              OT Education - 12/24/17 2101    Education provided  Yes    Education Details  splint wearing - doing contrast - massage forearm and start back with 1 lbs weight and putty     Person(s) Educated  Patient    Methods  Explanation;Demonstration;Handout    Comprehension  Returned demonstration;Verbalized understanding       OT Short Term Goals - 12/04/17 1809      OT SHORT TERM GOAL #1   Title  Pain on L wrist decrease to less than 5/10 at rest to wean out of splint     Baseline  still splint needed - pain 2-6/10     Time  3    Period  Weeks    Status  On-going    Target Date  12/25/17      OT SHORT TERM GOAL #2   Title  Pt to be ind in HEP to decrease pain , increase AROM in wrist in all planes and increase grip      Baseline  Pain increase again and decrease AROM and grip / 3 point grip     Time  4    Period  Weeks    Status  On-going    Target Date  01/01/18        OT Long Term Goals - 12/04/17 1810      OT LONG TERM GOAL #1   Title  Pt wrist AROM and strength increase for pt to use L hand in more than 50% of bathing , cut food, turn doorknob, without increase symptoms     Baseline  use it but increase symptoms- pain 3-6/10 at wrist with lifting or gripping    Time  6    Period  Weeks    Status  On-going    Target Date  01/15/18      OT LONG TERM GOAL #2   Title  L grip  increase with more than 5 lbs and prehension strength increase with more than 3 lbs to use hand in cooking , laudry and cut food     Baseline  L grip 28, R 35, - L 3 point grip decrease to 10 lbs since last taken  - but still not able to cut food and do laundery without symptoms      Time  8    Period  Weeks    Status  On-going    Target Date  01/29/18            Plan - 12/24/17 2105    Clinical Impression Statement  Pt cont to show edema over dorsal wrist -and still pain 4/10 - pt has trigger point over forearm volar and dorsal and upper arm - not changes with ionto use - intiated this date again strengthening but cont with wrist splint     Occupational performance deficits (Please refer to evaluation for details):  ADL's;IADL's;Play;Leisure    Rehab Potential  Fair    Current Impairments/barriers affecting progress:  co-morbitities     OT Frequency  2x / week    OT Duration  2 weeks    OT Treatment/Interventions  Self-care/ADL training;Iontophoresis;Therapeutic  exercise;Ultrasound;Contrast Bath;Fluidtherapy;Manual Therapy;Splinting;Patient/family education    Plan  assess progress with taping and massage    Clinical Decision Making  Several treatment options, min-mod task modification necessary    OT Home Exercise Plan  See pt instruction    Consulted and Agree with Plan of Care  Patient       Patient will  benefit from skilled therapeutic intervention in order to improve the following deficits and impairments:  Pain, Impaired flexibility, Increased edema, Decreased strength, Decreased range of motion, Impaired UE functional use  Visit Diagnosis: Muscle weakness (generalized)  Stiffness of left wrist, not elsewhere classified  Pain in left wrist    Problem List Patient Active Problem List   Diagnosis Date Noted  . Lymphedema 01/17/2016  . Venous (peripheral) insufficiency 01/17/2016  . Pain in limb 01/17/2016  . Iron deficiency anemia due to chronic blood loss 01/09/2016  . History of endometrial cancer 08/01/2015  . Endometrial adenocarcinoma ([place]) 07/13/2015    Class: History of  . Inguinal lymphadenopathy 07/13/2015  . Long term current use of insulin ([place]) 12/12/2013  . Microalbuminuria 12/12/2013  . Diabetes ([place]) 07/28/2013  . BP (high blood pressure) 07/28/2013  . HLD (hyperlipidemia) 07/28/2013  . Adiposity 07/28/2013  . Obstructive apnea 07/28/2013  . Arthritis, degenerative 07/28/2013  . Apnea, sleep 07/28/2013  . Thyroid nodule 07/28/2013    Rosalyn Gess  OTR/L,CLT 12/24/2017, 9:08 PM  [facility] [address], [place], [address] Phone: [phone]   Fax:  [phone]  Name: Christine Lawson MRN: [id number] Date of Birth: 17-Mar-1960

## 2017-12-29 ENCOUNTER — Ambulatory Visit: Payer: Medicare Other | Admitting: Occupational Therapy

## 2017-12-29 DIAGNOSIS — M25532 Pain in left wrist: Secondary | ICD-10-CM

## 2017-12-29 DIAGNOSIS — M6281 Muscle weakness (generalized): Secondary | ICD-10-CM

## 2017-12-29 DIAGNOSIS — M25632 Stiffness of left wrist, not elsewhere classified: Secondary | ICD-10-CM

## 2017-12-29 NOTE — Patient Instructions (Signed)
Pt to cont with splint   but pt to do AROM for wrist in all planes And then 2 lbs weight  For wrist in all planes   20 reps  and elbow flexion /extention 2 lbs 20 reps  Cont with teal putty for gripping 20 reps   1-2 x day

## 2017-12-29 NOTE — Therapy (Signed)
[facility] [address] [place], [address] Phone: [phone]   Fax:  [phone]  Occupational Therapy Treatment  Patient Details  Name: Christine Lawson MRN: [id number] Date of Birth: 1960/07/07 No data recorded  Encounter Date: 12/29/2017  OT End of Session - 12/29/17 1721    Visit Number  24    Number of Visits  24    Date for OT Re-Evaluation  01/29/18    OT Start Time  7902    OT Stop Time  1329    OT Time Calculation (min)  54 min    Activity Tolerance  Patient tolerated treatment well    Behavior During Therapy  [facility] for tasks assessed/performed       Past Medical History:  Diagnosis Date  . Diabetes mellitus without complication ([place])   . Endometrial adenocarcinoma ([place])   . GERD (gastroesophageal reflux disease)   . Hematochezia   . Hyperlipidemia   . Hypertension   . IDA (iron deficiency anemia)   . Inguinal lymphadenopathy 07/13/2015  . Irritable bowel syndrome   . Obesity   . Osteoarthritis   . Sleep apnea     Past Surgical History:  Procedure Laterality Date  . ABDOMINAL HYSTERECTOMY  feb 2013  . COLONOSCOPY WITH PROPOFOL N/A 08/02/2015   Procedure: COLONOSCOPY WITH PROPOFOL;  Surgeon: Manya Silvas, MD;  Location: [facility] ENDOSCOPY;  Service: Endoscopy;  Laterality: N/A;  . ESOPHAGOGASTRODUODENOSCOPY (EGD) WITH PROPOFOL N/A 08/02/2015   Procedure: ESOPHAGOGASTRODUODENOSCOPY (EGD) WITH PROPOFOL;  Surgeon: Manya Silvas, MD;  Location: [facility] ENDOSCOPY;  Service: Endoscopy;  Laterality: N/A;    There were no vitals filed for this visit.  Subjective Assessment - 12/29/17 1719    Subjective   I did not do the weight - did do the putty - pain still at my wrist - 4/10     Patient Stated Goals  I want to be able to use my hand like before - dressing , bathing , cutting food, lift and carry objects     Currently in Pain?  Yes    Pain Score  4     Pain Location  Wrist    Pain  Orientation  Left    Pain Descriptors / Indicators  Aching    Pain Type  Chronic pain    Pain Onset  More than a month ago         [facility] OT Assessment - 12/29/17 0001      Strength   Right Hand Grip (lbs)  40    Right Hand Lateral Pinch  13 lbs    Right Hand 3 Point Pinch  12 lbs    Left Hand Grip (lbs)  26    Left Hand Lateral Pinch  13 lbs    Left Hand 3 Point Pinch  10 lbs        after heat done some graston tool nr 2 for brushing and sweeping over volar and dorsal wrist /forearm and palm- tender over dorsalAnd volar forearm - multiple trigger points and tender- not responding on soft tissue-  And up into deltoid too  Discuss with PA about having pt do dry needling -one of PT's are  familiar with pt MRI was clear - no injuries  Cont to have swelling on dorsal wristand pain with use  - 4/10  initiated this date 2  lbs weight for wrist in all planes -and  Teal putty for gripping   12  reps  1-2 x day   Pt did not do 1 lbs weight over weekend - but did do the putty   Prior to ionto - skin check done- no issues Kept patch on afterwards for hour           OT Treatments/Exercises (OP) - 12/29/17 0001      Iontophoresis   Type of Iontophoresis  Dexamethasone    Location  dorsal wrist     Dose  med patch, 2.0 current     Time  19             OT Education - 12/29/17 1720    Education provided  Yes    Education Details  increase strengthening at wrist in all planes- 2 lbs     Person(s) Educated  Patient    Methods  Explanation;Demonstration;Handout    Comprehension  Returned demonstration;Verbalized understanding       OT Short Term Goals - 12/04/17 1809      OT SHORT TERM GOAL #1   Title  Pain on L wrist decrease to less than 5/10 at rest to wean out of splint     Baseline  still splint needed - pain 2-6/10     Time  3    Period  Weeks    Status  On-going    Target Date  12/25/17      OT SHORT TERM GOAL #2   Title  Pt to be ind in HEP  to decrease pain , increase AROM in wrist in all planes and increase grip     Baseline  Pain increase again and decrease AROM and grip / 3 point grip     Time  4    Period  Weeks    Status  On-going    Target Date  01/01/18        OT Long Term Goals - 12/04/17 1810      OT LONG TERM GOAL #1   Title  Pt wrist AROM and strength increase for pt to use L hand in more than 50% of bathing , cut food, turn doorknob, without increase symptoms     Baseline  use it but increase symptoms- pain 3-6/10 at wrist with lifting or gripping    Time  6    Period  Weeks    Status  On-going    Target Date  01/15/18      OT LONG TERM GOAL #2   Title  L grip  increase with more than 5 lbs and prehension strength increase with more than 3 lbs to use hand in cooking , laudry and cut food     Baseline  L grip 28, R 35, - L 3 point grip decrease to 10 lbs since last taken  - but still not able to cut food and do laundery without symptoms      Time  8    Period  Weeks    Status  On-going    Target Date  01/29/18            Plan - 12/29/17 1724    Clinical Impression Statement  Pt has been seen for 4 wks now since MRI showed no injury - pt not responding to modalities, soft issue mobs graston and manuanl , Ionto , ther ex - discuss this date with [place] PA to have PT try if pt will allow some dryneedling into proximal forearm couple of sessions for trigger point  - did  intiate  more strengthening last session   - and will wean pt out of splint in the next 2 wks     Occupational performance deficits (Please refer to evaluation for details):  ADL's;IADL's;Play;Leisure    Rehab Potential  Fair    Current Impairments/barriers affecting progress:  co-morbitities     OT Frequency  2x / week   1-2 x wk for    OT Duration  --   2-3 wks    OT Treatment/Interventions  Self-care/ADL training;Iontophoresis;Therapeutic exercise;Ultrasound;Contrast Bath;Fluidtherapy;Manual Therapy;Splinting;Patient/family education     Plan  assess progress with strengthening - and recieved verbal order for PT to try dryneedling     Clinical Decision Making  Several treatment options, min-mod task modification necessary    OT Home Exercise Plan  See pt instruction    Consulted and Agree with Plan of Care  Patient       Patient will benefit from skilled therapeutic intervention in order to improve the following deficits and impairments:  Pain, Impaired flexibility, Increased edema, Decreased strength, Decreased range of motion, Impaired UE functional use  Visit Diagnosis: Muscle weakness (generalized)  Stiffness of left wrist, not elsewhere classified  Pain in left wrist    Problem List Patient Active Problem List   Diagnosis Date Noted  . Lymphedema 01/17/2016  . Venous (peripheral) insufficiency 01/17/2016  . Pain in limb 01/17/2016  . Iron deficiency anemia due to chronic blood loss 01/09/2016  . History of endometrial cancer 08/01/2015  . Endometrial adenocarcinoma ([place]) 07/13/2015    Class: History of  . Inguinal lymphadenopathy 07/13/2015  . Long term current use of insulin ([place]) 12/12/2013  . Microalbuminuria 12/12/2013  . Diabetes ([place]) 07/28/2013  . BP (high blood pressure) 07/28/2013  . HLD (hyperlipidemia) 07/28/2013  . Adiposity 07/28/2013  . Obstructive apnea 07/28/2013  . Arthritis, degenerative 07/28/2013  . Apnea, sleep 07/28/2013  . Thyroid nodule 07/28/2013    Rosalyn Gess OTR/L,CLT 12/29/2017, 5:29 PM  [facility] [place] [facility] [facility] [address], [place], [address] Phone: [phone]   Fax:  [phone]  Name: Eddie Koc MRN: [id number] Date of Birth: [birth year]

## 2017-12-31 ENCOUNTER — Ambulatory Visit: Payer: Medicare Other | Admitting: Occupational Therapy

## 2017-12-31 DIAGNOSIS — M25532 Pain in left wrist: Secondary | ICD-10-CM

## 2017-12-31 DIAGNOSIS — M25632 Stiffness of left wrist, not elsewhere classified: Secondary | ICD-10-CM

## 2017-12-31 DIAGNOSIS — M6281 Muscle weakness (generalized): Secondary | ICD-10-CM

## 2017-12-31 NOTE — Patient Instructions (Signed)
Wrist flexor and extensor stretch with extended elbow 5 reps   hold 3-5 sec  Prior to 2 lbs for sup /pro, RD, UD , and elbow flexion and extention   20 reps  2 x day  Wean into [place] neoprene splint on for activities   no splint on for sleeping or watching tv

## 2017-12-31 NOTE — Therapy (Signed)
[facility] [address], [place], [address] Phone: [phone]   Fax:  [phone]  Occupational Therapy Treatment  Patient Details  Name: Christine Lawson MRN: [id number] Date of Birth: 08-26-1960 No data recorded  Encounter Date: 12/31/2017  OT End of Session - 12/31/17 1357    Visit Number  25    Number of Visits  29    Date for OT Re-Evaluation  01/28/18    OT Start Time  1305    OT Stop Time  1340    OT Time Calculation (min)  35 min    Activity Tolerance  Patient tolerated treatment well    Behavior During Therapy  [facility] for tasks assessed/performed       Past Medical History:  Diagnosis Date  . Diabetes mellitus without complication ([place])   . Endometrial adenocarcinoma ([place])   . GERD (gastroesophageal reflux disease)   . Hematochezia   . Hyperlipidemia   . Hypertension   . IDA (iron deficiency anemia)   . Inguinal lymphadenopathy 07/13/2015  . Irritable bowel syndrome   . Obesity   . Osteoarthritis   . Sleep apnea     Past Surgical History:  Procedure Laterality Date  . ABDOMINAL HYSTERECTOMY  feb 2013  . COLONOSCOPY WITH PROPOFOL N/A 08/02/2015   Procedure: COLONOSCOPY WITH PROPOFOL;  Surgeon: Manya Silvas, MD;  Location[facility] ENDOSCOPY;  Service: Endoscopy;  Laterality: N/A;  . ESOPHAGOGASTRODUODENOSCOPY (EGD) WITH PROPOFOL N/A 08/02/2015   Procedure: ESOPHAGOGASTRODUODENOSCOPY (EGD) WITH PROPOFOL;  Surgeon: Manya Silvas, MD;  Location: [facility] ENDOSCOPY;  Service: Endoscopy;  Laterality: N/A;    There were no vitals filed for this visit.  Subjective Assessment - 12/31/17 1354    Subjective   I am using my hand to bath and dress myself -  shoes and socks are hard to do - pain about 4/10 but I do want to get better- [name] do  a lot at home     Patient Stated Goals  I want to be able to use my hand like before - dressing , bathing , cutting food, lift and carry objects     Currently  in Pain?  Yes    Pain Score  4     Pain Location  Wrist    Pain Orientation  Left    Pain Descriptors / Indicators  Aching    Pain Type  Chronic pain       Received verbal order from PA Cameron Proud to do dry needling - pt did provide  Verbal consent - and  [place] - PT - that worked with pt before with knee and shoulder done the dry needle on dorsal forearm -  Afterwards OT done with pt stretches with extended elbow for flexors and extensors of forearm and wrist on L   Followed by 2 lbs 20 reps  For wrist RD, UD, sup and pronation -  No issues per pt  And elbow flexion and extention 2 lbs  20 reps   Pt to do at home   done ice on forearm and wrist - 3 x 1 -2 min  Pt to do stretches at home and 2 lbs weight  But hold off on wrist extention and flexion                      OT Education - 12/31/17 1356    Education provided  Yes    Education Details  stretches to forearm , and neoprene splint use     Person(s) Educated  Patient    Methods  Explanation;Demonstration;Handout    Comprehension  Returned demonstration;Verbalized understanding       OT Short Term Goals - 12/31/17 1400      OT SHORT TERM GOAL #1   Title  Pain on L wrist decrease to less than 5/10 at rest to wean out of splint     Baseline  pain in wrist stays about 4/10     Time  2    Period  Weeks    Status  On-going    Target Date  01/14/18      OT SHORT TERM GOAL #2   Title  Pt to be ind in HEP to decrease pain , increase AROM in wrist in all planes and increase grip     Baseline  Pt not always doing her HEP - and still increase pain and decrease strength     Time  3    Period  Weeks    Status  On-going    Target Date  01/21/18        OT Long Term Goals - 12/31/17 1402      OT LONG TERM GOAL #1   Title  Pt wrist AROM and strength increase for pt to use L hand in more than 50% of bathing , cut food, turn doorknob, without increase symptoms     Baseline  Pt report she is using it in  bathing and dressing - but pt to increase use and use neoprene wrist splint if needed - cont strengthening and stretches     Time  4    Period  Weeks    Status  On-going    Target Date  01/28/18      OT LONG TERM GOAL #2   Title  L grip  increase with more than 5 lbs and prehension strength increase with more than 3 lbs to use hand in cooking , laudry and cut food     Baseline  see flowsheet     Time  4    Period  Weeks    Status  On-going    Target Date  01/28/18            Plan - 12/31/17 1357    Clinical Impression Statement  Discuss with pt lack in progress the last 4 wks since MRI showed no injury - done modalities, soft tissue , graston, manual , taping - splinting , ionto and therex - discuss with PA  after last session to have PT do few session of dry needling on forearm to decrease trigger points - add this date some stretches for pt and pt to cont to increase strengthing , functional use and wean into neoprene wrist splint with only functional tasks - will see pt  4 sessions over the next 4 wks     Occupational performance deficits (Please refer to evaluation for details):  ADL's;IADL's;Play;Leisure    OT Treatment/Interventions  Self-care/ADL training;Iontophoresis;Therapeutic exercise;Ultrasound;Contrast Bath;Fluidtherapy;Manual Therapy;Splinting;Patient/family education    Plan  how she felt after dryneedling - repeat if needed - stretches , and strengthening     Clinical Decision Making  Several treatment options, min-mod task modification necessary    OT Home Exercise Plan  See pt instruction    Consulted and Agree with Plan of Care  Patient       Patient will benefit from skilled therapeutic intervention in order to improve the  following deficits and impairments:     Visit Diagnosis: Muscle weakness (generalized) - Plan: Ot plan of care cert/re-cert  Stiffness of left wrist, not elsewhere classified - Plan: Ot plan of care cert/re-cert  Pain in left wrist -  Plan: Ot plan of care cert/re-cert    Problem List Patient Active Problem List   Diagnosis Date Noted  . Lymphedema 01/17/2016  . Venous (peripheral) insufficiency 01/17/2016  . Pain in limb 01/17/2016  . Iron deficiency anemia due to chronic blood loss 01/09/2016  . History of endometrial cancer 08/01/2015  . Endometrial adenocarcinoma ([place]) 07/13/2015    Class: History of  . Inguinal lymphadenopathy 07/13/2015  . Long term current use of insulin ([place]) 12/12/2013  . Microalbuminuria 12/12/2013  . Diabetes ([place]) 07/28/2013  . BP (high blood pressure) 07/28/2013  . HLD (hyperlipidemia) 07/28/2013  . Adiposity 07/28/2013  . Obstructive apnea 07/28/2013  . Arthritis, degenerative 07/28/2013  . Apnea, sleep 07/28/2013  . Thyroid nodule 07/28/2013    Rosalyn Gess OTR/L,CLT 12/31/2017, 6:13 PM  [place] [facility] [address] [place], [address] Phone: [phone]   Fax:  [phone]  Name: Christine Lawson MRN: [id number] Date of Birth: 18-Dec-1960

## 2018-01-04 ENCOUNTER — Ambulatory Visit: Payer: Medicare Other | Attending: Student | Admitting: Occupational Therapy

## 2018-01-04 DIAGNOSIS — M25532 Pain in left wrist: Secondary | ICD-10-CM | POA: Diagnosis present

## 2018-01-04 DIAGNOSIS — M25632 Stiffness of left wrist, not elsewhere classified: Secondary | ICD-10-CM

## 2018-01-04 DIAGNOSIS — M6281 Muscle weakness (generalized): Secondary | ICD-10-CM

## 2018-01-04 NOTE — Patient Instructions (Signed)
Cont with wrist flexor and extensor stretches - 10 reps  hold 5 sec   Pt can do 2 lbs for wrist sup, pro, RD, UD   20 reps   but 2 sets  Teal putty - can do 2 sets of 20 reps  and add this date pulling and twisting ( away and towards you)  20 reps   And increase use at home  And wear as needed Blue soft neoprene splint

## 2018-01-04 NOTE — Therapy (Signed)
[facility] [address] [place], [address] Phone: [phone]   Fax:  [phone]  Occupational Therapy Treatment  Patient Details  Name: Christine Lawson MRN: [id number] Date of Birth: 04-Oct-1960 No data recorded  Encounter Date: 01/04/2018  OT End of Session - 01/04/18 1737    Visit Number  26    Number of Visits  29    Date for OT Re-Evaluation  01/28/18    OT Start Time  1300    OT Stop Time  1336    OT Time Calculation (min)  36 min    Activity Tolerance  Patient tolerated treatment well    Behavior During Therapy  [facility] for tasks assessed/performed       Past Medical History:  Diagnosis Date  . Diabetes mellitus without complication ([place])   . Endometrial adenocarcinoma ([place])   . GERD (gastroesophageal reflux disease)   . Hematochezia   . Hyperlipidemia   . Hypertension   . IDA (iron deficiency anemia)   . Inguinal lymphadenopathy 07/13/2015  . Irritable bowel syndrome   . Obesity   . Osteoarthritis   . Sleep apnea     Past Surgical History:  Procedure Laterality Date  . ABDOMINAL HYSTERECTOMY  feb 2013  . COLONOSCOPY WITH PROPOFOL N/A 08/02/2015   Procedure: COLONOSCOPY WITH PROPOFOL;  Surgeon: Manya Silvas, MD;  Location: [facility] ENDOSCOPY;  Service: Endoscopy;  Laterality: N/A;  . ESOPHAGOGASTRODUODENOSCOPY (EGD) WITH PROPOFOL N/A 08/02/2015   Procedure: ESOPHAGOGASTRODUODENOSCOPY (EGD) WITH PROPOFOL;  Surgeon: Manya Silvas, MD;  Location: [facility] ENDOSCOPY;  Service: Endoscopy;  Laterality: N/A;    There were no vitals filed for this visit.  Subjective Assessment - 01/04/18 1308    Subjective   I done the 2 lbs weight and stretches -and used my hand more over the weekend - did the soft splint on my wrist- I felt okay after last time     Patient Stated Goals  I want to be able to use my hand like before - dressing , bathing , cutting food, lift and carry objects     Currently in Pain?   Yes    Pain Score  3     Pain Location  Wrist    Pain Orientation  Left    Pain Descriptors / Indicators  Aching    Pain Type  Chronic pain    Pain Onset  More than a month ago          Received verbal order from PA Cameron Proud to do dry needling - pt did provide  Verbal consent - and  [place] - PT - that worked with pt before with knee and shoulder done the dry needle on dorsal and volar  Forearm this date - 2nd session this date   Afterwards OT done with pt stretches with extended elbow for flexors and extensors of forearm and wrist on L  - 10 reps  Hold 5 sec  Pt to cont at home with them   Prior to needling done  2 lbs 20 reps  For wrist RD, UD, sup and pronation -  No issues per pt  And elbow flexion and extention 2 lbs  20 reps Pt can do 2 sets at home   Add teal putty - pt doing gripping already  - add this date pulling , twisting both ways  10 reps  Add to HEP    Pt to do at home  done ice on forearm and wrist - 2 min  After needling Pt to do stretches at home and 2 lbs weight  But hold off on wrist extention and flexion                   OT Education - 01/04/18 1737    Education provided  Yes    Education Details  add teal putty for pulling and twisting     Person(s) Educated  Patient    Methods  Explanation;Demonstration;Handout    Comprehension  Returned demonstration;Verbalized understanding       OT Short Term Goals - 12/31/17 1400      OT SHORT TERM GOAL #1   Title  Pain on L wrist decrease to less than 5/10 at rest to wean out of splint     Baseline  pain in wrist stays about 4/10     Time  2    Period  Weeks    Status  On-going    Target Date  01/14/18      OT SHORT TERM GOAL #2   Title  Pt to be ind in HEP to decrease pain , increase AROM in wrist in all planes and increase grip     Baseline  Pt not always doing her HEP - and still increase pain and decrease strength     Time  3    Period  Weeks    Status  On-going     Target Date  01/21/18        OT Long Term Goals - 12/31/17 1402      OT LONG TERM GOAL #1   Title  Pt wrist AROM and strength increase for pt to use L hand in more than 50% of bathing , cut food, turn doorknob, without increase symptoms     Baseline  Pt report she is using it in bathing and dressing - but pt to increase use and use neoprene wrist splint if needed - cont strengthening and stretches     Time  4    Period  Weeks    Status  On-going    Target Date  01/28/18      OT LONG TERM GOAL #2   Title  L grip  increase with more than 5 lbs and prehension strength increase with more than 3 lbs to use hand in cooking , laudry and cut food     Baseline  see flowsheet     Time  4    Period  Weeks    Status  On-going    Target Date  01/28/18            Plan - 01/04/18 1737    Clinical Impression Statement  Pt show progress in wrist extensor and flexor stretches - and able to do 2 lbs -increase sets and add more putty HEP - pt do report pain decrease to 3/10 - repeat this date by PT dryneedle - 2-3 in extensors and one in flexors - but pt tolerating it well     Occupational performance deficits (Please refer to evaluation for details):  ADL's;IADL's;Play;Leisure    Rehab Potential  Fair    Current Impairments/barriers affecting progress:  co-morbitities     OT Frequency  --   2 x 1wks ,then 1 x wk and then biweekly    OT Duration  --   3 wks    OT Treatment/Interventions  Self-care/ADL training;Iontophoresis;Therapeutic exercise;Ultrasound;Contrast Bath;Fluidtherapy;Manual Therapy;Splinting;Patient/family education    Plan  how she felt after 2nd session of dryneedling - repeat if needed - stretches , and strengthening     Clinical Decision Making  Several treatment options, min-mod task modification necessary    OT Home Exercise Plan  See pt instruction    Consulted and Agree with Plan of Care  Patient       Patient will benefit from skilled therapeutic intervention in  order to improve the following deficits and impairments:  Pain, Impaired flexibility, Increased edema, Decreased strength, Decreased range of motion, Impaired UE functional use  Visit Diagnosis: Muscle weakness (generalized)  Stiffness of left wrist, not elsewhere classified  Pain in left wrist    Problem List Patient Active Problem List   Diagnosis Date Noted  . Lymphedema 01/17/2016  . Venous (peripheral) insufficiency 01/17/2016  . Pain in limb 01/17/2016  . Iron deficiency anemia due to chronic blood loss 01/09/2016  . History of endometrial cancer 08/01/2015  . Endometrial adenocarcinoma ([place]) 07/13/2015    Class: History of  . Inguinal lymphadenopathy 07/13/2015  . Long term current use of insulin ([place]) 12/12/2013  . Microalbuminuria 12/12/2013  . Diabetes ([place]) 07/28/2013  . BP (high blood pressure) 07/28/2013  . HLD (hyperlipidemia) 07/28/2013  . Adiposity 07/28/2013  . Obstructive apnea 07/28/2013  . Arthritis, degenerative 07/28/2013  . Apnea, sleep 07/28/2013  . Thyroid nodule 07/28/2013    Rosalyn Gess OTR/L,CLT 01/04/2018, 5:41 PM  [facility] [address] [place], [address] Phone: [phone]   Fax:  [phone]  Name: Khalidah Herbold MRN: [id number] Date of Birth: 1960-06-30

## 2018-01-06 ENCOUNTER — Ambulatory Visit: Payer: Medicare Other | Admitting: Occupational Therapy

## 2018-01-06 DIAGNOSIS — M25632 Stiffness of left wrist, not elsewhere classified: Secondary | ICD-10-CM

## 2018-01-06 DIAGNOSIS — M6281 Muscle weakness (generalized): Secondary | ICD-10-CM | POA: Diagnosis not present

## 2018-01-06 DIAGNOSIS — M25532 Pain in left wrist: Secondary | ICD-10-CM

## 2018-01-06 NOTE — Patient Instructions (Signed)
Same HEP with 2 lbs weight   but upgrade to green putty  for grip, pulling and twisting  20 reps  pain should not be more than 4/10

## 2018-01-06 NOTE — Therapy (Signed)
[facility] [address], [place], [address] Phone: [phone]   Fax:  [phone]  Occupational Therapy Treatment  Patient Details  Name: Christine Lawson MRN: [id number] Date of Birth: Jan 14, 1961 No data recorded  Encounter Date: 01/06/2018  OT End of Session - 01/06/18 1723    Visit Number  27    Number of Visits  29    Date for OT Re-Evaluation  01/28/18    OT Start Time  1345    OT Stop Time  1430    OT Time Calculation (min)  45 min    Activity Tolerance  Patient tolerated treatment well    Behavior During Therapy  [facility] for tasks assessed/performed       Past Medical History:  Diagnosis Date  . Diabetes mellitus without complication ([place])   . Endometrial adenocarcinoma ([place])   . GERD (gastroesophageal reflux disease)   . Hematochezia   . Hyperlipidemia   . Hypertension   . IDA (iron deficiency anemia)   . Inguinal lymphadenopathy 07/13/2015  . Irritable bowel syndrome   . Obesity   . Osteoarthritis   . Sleep apnea     Past Surgical History:  Procedure Laterality Date  . ABDOMINAL HYSTERECTOMY  feb 2013  . COLONOSCOPY WITH PROPOFOL N/A 08/02/2015   Procedure: COLONOSCOPY WITH PROPOFOL;  Surgeon: Manya Silvas, MD;  Location: [facility] ENDOSCOPY;  Service: Endoscopy;  Laterality: N/A;  . ESOPHAGOGASTRODUODENOSCOPY (EGD) WITH PROPOFOL N/A 08/02/2015   Procedure: ESOPHAGOGASTRODUODENOSCOPY (EGD) WITH PROPOFOL;  Surgeon: Manya Silvas, MD;  Location: [facility] ENDOSCOPY;  Service: Endoscopy;  Laterality: N/A;    There were no vitals filed for this visit.  Subjective Assessment - 01/06/18 1720    Subjective   I did not do the exercises yesterday - we worked at the voting thing - I do more at the house - it hurts some     Patient Stated Goals  I want to be able to use my hand like before - dressing , bathing , cutting food, lift and carry objects     Currently in Pain?  Yes    Pain Score  4     Pain Location  Wrist    Pain Orientation  Left    Pain Descriptors / Indicators  Aching    Pain Type  Chronic pain    Pain Onset  More than a month ago          Received verbal order from PA Cameron Proud last week  to do dry needling - pt did provide Verbal consent - and Chelsea - PT - that worked with pt before with knee and shoulder done the dry needle on dorsal and volar  Forearm this date - 3rd session this date   Afterwards OT done with pt stretches with extended elbow for flexors and extensors of forearm and wrist on L  - 10 reps  Hold 5 sec  Pt to cont at home with them   Prior to needling done  2 lbs 20 reps  For wrist RD, UD, sup and pronation -  No issues per pt  And elbow flexion and extention 2 lbs  20 reps Pt can do 2 sets at home   Upgrade to green putty - pt doing gripping already  - pulling , twisting both ways  10 reps  Cont  HEP   Pt to do at home  done ice on forearm and wrist - 2 min  After needling Pt to do stretches at home and 2 lbs weight  But hold off on wrist extention and flexion                   OT Education - 01/06/18 1723    Education provided  Yes    Education Details  upgrade putty to green     Person(s) Educated  Patient    Methods  Explanation;Demonstration;Handout    Comprehension  Returned demonstration;Verbalized understanding       OT Short Term Goals - 12/31/17 1400      OT SHORT TERM GOAL #1   Title  Pain on L wrist decrease to less than 5/10 at rest to wean out of splint     Baseline  pain in wrist stays about 4/10     Time  2    Period  Weeks    Status  On-going    Target Date  01/14/18      OT SHORT TERM GOAL #2   Title  Pt to be ind in HEP to decrease pain , increase AROM in wrist in all planes and increase grip     Baseline  Pt not always doing her HEP - and still increase pain and decrease strength     Time  3    Period  Weeks    Status  On-going    Target Date  01/21/18        OT Long  Term Goals - 12/31/17 1402      OT LONG TERM GOAL #1   Title  Pt wrist AROM and strength increase for pt to use L hand in more than 50% of bathing , cut food, turn doorknob, without increase symptoms     Baseline  Pt report she is using it in bathing and dressing - but pt to increase use and use neoprene wrist splint if needed - cont strengthening and stretches     Time  4    Period  Weeks    Status  On-going    Target Date  01/28/18      OT LONG TERM GOAL #2   Title  L grip  increase with more than 5 lbs and prehension strength increase with more than 3 lbs to use hand in cooking , laudry and cut food     Baseline  see flowsheet     Time  4    Period  Weeks    Status  On-going    Target Date  01/28/18            Plan - 01/06/18 1724    Clinical Impression Statement  Pt to use hand and wrist more- use neoprene splint as needed - but cont to tolerate 2 lbs weight and increase putty to med green - pt responding good to dryneedling by PT     Occupational performance deficits (Please refer to evaluation for details):  ADL's;IADL's;Play;Leisure    Rehab Potential  Fair    Current Impairments/barriers affecting progress:  co-morbitities     OT Frequency  1x / week    OT Duration  --   3 wks   OT Treatment/Interventions  Self-care/ADL training;Iontophoresis;Therapeutic exercise;Ultrasound;Contrast Bath;Fluidtherapy;Manual Therapy;Splinting;Patient/family education    Plan  how she felt after 3rd session of dryneedling - repeat if needed - stretches , and strengthening     Clinical Decision Making  Several treatment options, min-mod task modification necessary    OT Home Exercise Plan  See  pt instruction    Consulted and Agree with Plan of Care  Patient       Patient will benefit from skilled therapeutic intervention in order to improve the following deficits and impairments:  Pain, Impaired flexibility, Increased edema, Decreased strength, Decreased range of motion, Impaired UE  functional use  Visit Diagnosis: Muscle weakness (generalized)  Stiffness of left wrist, not elsewhere classified  Pain in left wrist    Problem List Patient Active Problem List   Diagnosis Date Noted  . Lymphedema 01/17/2016  . Venous (peripheral) insufficiency 01/17/2016  . Pain in limb 01/17/2016  . Iron deficiency anemia due to chronic blood loss 01/09/2016  . History of endometrial cancer 08/01/2015  . Endometrial adenocarcinoma ([place]) 07/13/2015    Class: History of  . Inguinal lymphadenopathy 07/13/2015  . Long term current use of insulin ([place]) 12/12/2013  . Microalbuminuria 12/12/2013  . Diabetes ([place]) 07/28/2013  . BP (high blood pressure) 07/28/2013  . HLD (hyperlipidemia) 07/28/2013  . Adiposity 07/28/2013  . Obstructive apnea 07/28/2013  . Arthritis, degenerative 07/28/2013  . Apnea, sleep 07/28/2013  . Thyroid nodule 07/28/2013    Rosalyn Gess OTR/L,CLT 01/06/2018, 5:27 PM  [facility] [facility] [address] [place], [address] Phone: [phone]   Fax:  [phone]  Name: Christine Lawson MRN: [id number] Date of Birth: 07-30-1960

## 2018-01-11 ENCOUNTER — Encounter: Payer: Self-pay | Admitting: Emergency Medicine

## 2018-01-11 ENCOUNTER — Emergency Department
Admission: EM | Admit: 2018-01-11 | Discharge: 2018-01-11 | Disposition: A | Discharge status: Home / Self Care | Payer: Medicare Other | Attending: Emergency Medicine | Admitting: Emergency Medicine

## 2018-01-11 DIAGNOSIS — I10 Essential (primary) hypertension: Secondary | ICD-10-CM | POA: Diagnosis not present

## 2018-01-11 DIAGNOSIS — R1084 Generalized abdominal pain: Secondary | ICD-10-CM | POA: Insufficient documentation

## 2018-01-11 DIAGNOSIS — Z7982 Long term (current) use of aspirin: Secondary | ICD-10-CM | POA: Diagnosis not present

## 2018-01-11 DIAGNOSIS — E119 Type 2 diabetes mellitus without complications: Secondary | ICD-10-CM | POA: Diagnosis not present

## 2018-01-11 DIAGNOSIS — E785 Hyperlipidemia, unspecified: Secondary | ICD-10-CM | POA: Diagnosis not present

## 2018-01-11 DIAGNOSIS — Z79899 Other long term (current) drug therapy: Secondary | ICD-10-CM | POA: Insufficient documentation

## 2018-01-11 DIAGNOSIS — Z794 Long term (current) use of insulin: Secondary | ICD-10-CM | POA: Diagnosis not present

## 2018-01-11 LAB — COMPREHENSIVE METABOLIC PANEL
ALBUMIN: 3.6 g/dL (ref 3.5–5.0)
ALK PHOS: 102 U/L (ref 38–126)
ALT: 28 U/L (ref 0–44)
AST: 38 U/L (ref 15–41)
Anion gap: 11 (ref 5–15)
BUN: 9 mg/dL (ref 6–20)
CALCIUM: 8.5 mg/dL — AB (ref 8.9–10.3)
CO2: 28 mmol/L (ref 22–32)
CREATININE: 0.59 mg/dL (ref 0.44–1.00)
Chloride: 99 mmol/L (ref 98–111)
GFR calc non Af Amer: >60 mL/min (ref >60)
GLUCOSE: 233 mg/dL — AB (ref 70–99)
Potassium: 3.9 mmol/L (ref 3.5–5.1)
SODIUM: 138 mmol/L (ref 135–145)
Total Bilirubin: 0.8 mg/dL (ref 0.3–1.2)
Total Protein: 8 g/dL (ref 6.5–8.1)

## 2018-01-11 LAB — LIPASE, BLOOD: Lipase: 23 U/L (ref 11–51)

## 2018-01-11 LAB — CBC
HEMATOCRIT: 41.4 % (ref 36.0–46.0)
Hemoglobin: 12.8 g/dL (ref 12.0–15.0)
MCH: 26.2 pg (ref 26.0–34.0)
MCHC: 30.9 g/dL (ref 30.0–36.0)
MCV: 84.8 fL (ref 80.0–100.0)
PLATELETS: 421 10*3/uL — AB (ref 150–400)
RBC: 4.88 MIL/uL (ref 3.87–5.11)
RDW: 13.2 % (ref 11.5–15.5)
WBC: 10.8 10*3/uL — ABNORMAL HIGH (ref 4.0–10.5)
nRBC: 0 % (ref 0.0–0.2)

## 2018-01-11 LAB — URINALYSIS, COMPLETE (UACMP) WITH MICROSCOPIC
Bilirubin Urine: NEGATIVE
GLUCOSE, UA: NEGATIVE mg/dL
Hgb urine dipstick: NEGATIVE
Ketones, ur: NEGATIVE mg/dL
Leukocytes, UA: NEGATIVE
Nitrite: NEGATIVE
PH: 6 (ref 5.0–8.0)
Protein, ur: 100 mg/dL — AB
Specific Gravity, Urine: 1.005 (ref 1.005–1.030)

## 2018-01-11 MED ORDER — HYOSCYAMINE SULFATE 0.125 MG PO TABS: 0.1250 mg | ORAL_TABLET | Freq: Four times a day (QID) | ORAL | 0 refills | Status: DC | PRN

## 2018-01-11 MED ORDER — SUCRALFATE 1 G PO TABS: 1.0000 g | ORAL_TABLET | Freq: Four times a day (QID) | ORAL | 0 refills | Status: DC

## 2018-01-11 MED ORDER — SUCRALFATE 1 G PO TABS
1.0000 g | ORAL_TABLET | Freq: Once | ORAL | Status: AC
  Administered 2018-01-11: 1 g via ORAL
  Filled 2018-01-11: qty 1

## 2018-01-11 NOTE — ED Triage Notes (Signed)
Patient presents to the ED with abdominal pain that began last night with 4 episodes of diarrhea today and nausea with dry heaving.  Patient states abdominal pain is on the right lower side mainly.  Patient appears uncomfortable.

## 2018-01-11 NOTE — ED Provider Notes (Signed)
[facility] Emergency Department Provider Note   ____________________________________________   I have reviewed the triage vital signs and the nursing notes.   HISTORY  Chief Complaint Abdominal Pain   History limited by: Not Limited   HPI Christine Lawson is a 57 y.o. female who presents to the emergency department today because of concern for abdominal pain. The patient states the pain started last night. Was slightly worse this morning. Located on the left side of her abdomen. The patient states she has had similar pain in the past. Most recently 2 weeks ago but before than it had been a couple of years. Had seen doctors at that time and was told that it was possibly IBS. The patient denies being under any particular stress currently. She denies any fevers.    Per medical record review patient has a history of GERD, IBS.   Past Medical History:  Diagnosis Date  . Diabetes mellitus without complication ([place])   . Endometrial adenocarcinoma ([place])   . GERD (gastroesophageal reflux disease)   . Hematochezia   . Hyperlipidemia   . Hypertension   . IDA (iron deficiency anemia)   . Inguinal lymphadenopathy 07/13/2015  . Irritable bowel syndrome   . Obesity   . Osteoarthritis   . Sleep apnea     Patient Active Problem List   Diagnosis Date Noted  . Lymphedema 01/17/2016  . Venous (peripheral) insufficiency 01/17/2016  . Pain in limb 01/17/2016  . Iron deficiency anemia due to chronic blood loss 01/09/2016  . History of endometrial cancer 08/01/2015  . Endometrial adenocarcinoma ([place]) 07/13/2015    Class: History of  . Inguinal lymphadenopathy 07/13/2015  . Long term current use of insulin ([place]) 12/12/2013  . Microalbuminuria 12/12/2013  . Diabetes ([place]) 07/28/2013  . BP (high blood pressure) 07/28/2013  . HLD (hyperlipidemia) 07/28/2013  . Adiposity 07/28/2013  . Obstructive apnea 07/28/2013  . Arthritis, degenerative 07/28/2013  . Apnea,  sleep 07/28/2013  . Thyroid nodule 07/28/2013    Past Surgical History:  Procedure Laterality Date  . ABDOMINAL HYSTERECTOMY  feb 2013  . COLONOSCOPY WITH PROPOFOL N/A 08/02/2015   Procedure: COLONOSCOPY WITH PROPOFOL;  Surgeon: Manya Silvas, MD;  Location: [facility] ENDOSCOPY;  Service: Endoscopy;  Laterality: N/A;  . ESOPHAGOGASTRODUODENOSCOPY (EGD) WITH PROPOFOL N/A 08/02/2015   Procedure: ESOPHAGOGASTRODUODENOSCOPY (EGD) WITH PROPOFOL;  Surgeon: Manya Silvas, MD;  Location: [facility] ENDOSCOPY;  Service: Endoscopy;  Laterality: N/A;    Prior to Admission medications   Medication Sig Start Date End Date Taking? Authorizing Provider  albuterol (PROVENTIL HFA;VENTOLIN HFA) 108 (90 Base) MCG/ACT inhaler Inhale 1 puff into the lungs every 6 (six) hours as needed for wheezing or shortness of breath.    [provider]  allopurinol (ZYLOPRIM) 100 MG tablet Take 100 mg by mouth daily.  09/07/15   [provider]  amLODipine (NORVASC) 10 MG tablet Take 10 mg by mouth daily.    [provider]  aspirin (ASPIRIN EC) 81 MG EC tablet Take 81 mg by mouth daily. Swallow whole.    [provider]  cholecalciferol (VITAMIN D) 1000 UNITS tablet Take 1,000 Units by mouth daily.    [provider]  diclofenac sodium (VOLTAREN) 1 % GEL Apply 1 application topically 2 (two) times daily as needed for pain. 10/07/16   [provider]  enalapril (VASOTEC) 2.5 MG tablet Take 2.5 mg by mouth daily.  01/12/16   [provider]  ferrous sulfate 325 (65 FE) MG tablet  Take 325 mg by mouth 2 (two) times daily with a meal.     [provider]  gabapentin (NEURONTIN) 300 MG capsule Take 300 mg by mouth at bedtime.    [provider]  insulin aspart (NOVOLOG) 100 UNIT/ML injection Inject 20 Units into the skin 3 (three) times daily before meals. 24 units in am, 22 units at lunch and 58 units at supper    [provider]  Insulin Glargine  (LANTUS SOLOSTAR) 100 UNIT/ML Solostar Pen Inject 50 Units as directed at bedtime.  03/17/14   [provider]  Lifitegrast Shirley Friar) 5 % SOLN Apply 1 drop to eye as needed for dry eyes. 02/20/16   [provider]  metFORMIN (GLUCOPHAGE-XR) 500 MG 24 hr tablet Take 2 tablets by mouth 2 (two) times daily.  07/26/14   [provider]  omeprazole (PRILOSEC) 40 MG capsule Take 40 mg by mouth daily.     [provider]  vitamin B-12 (CYANOCOBALAMIN) 500 MCG tablet Take 1 tablet by mouth daily.    [provider]    Allergies Solifenacin; Contrast media [iodinated diagnostic agents]; Dicyclomine; and Isovue [iopamidol]  Family History  Problem Relation Age of Onset  . Diabetes Mother   . Diabetes Father     Social History Social History   Tobacco Use  . Smoking status: Never Smoker  . Smokeless tobacco: Never Used  Substance Use Topics  . Alcohol use: No  . Drug use: No    Review of Systems Constitutional: No fever/chills Eyes: No visual changes. ENT: No sore throat. Cardiovascular: Denies chest pain. Respiratory: Denies shortness of breath. Gastrointestinal: Positive for abdominal pain and diarrhea.  Genitourinary: Negative for dysuria. Musculoskeletal: Negative for back pain. Skin: Negative for rash. Neurological: Negative for headaches, focal weakness or numbness.  ____________________________________________   PHYSICAL EXAM:  VITAL SIGNS: ED Triage Vitals  Enc Vitals Group     BP 01/11/18 1551 (!) 163/55     Pulse Rate 01/11/18 1551 88     Resp 01/11/18 1551 20     Temp 01/11/18 1551 98.4 F (36.9 C)     Temp Source 01/11/18 1551 Oral     SpO2 01/11/18 1551 96 %     Weight 01/11/18 1551 260 lb (117.9 kg)     Height 01/11/18 1551 5\' 3"  (1.6 m)     Head Circumference --      Peak Flow --      Pain Score 01/11/18 1601 10   Constitutional: Alert and oriented.  Eyes: Conjunctivae are normal.  ENT      Head:  Normocephalic and atraumatic.      Nose: No congestion/rhinnorhea.      Mouth/Throat: Mucous membranes are moist.      Neck: No stridor. Hematological/Lymphatic/Immunilogical: No cervical lymphadenopathy. Cardiovascular: Normal rate, regular rhythm.  No murmurs, rubs, or gallops.  Respiratory: Normal respiratory effort without tachypnea nor retractions. Breath sounds are clear and equal bilaterally. No wheezes/rales/rhonchi. Gastrointestinal: Soft and slightly tender to palpation, worse on the right side.  Genitourinary: Deferred Musculoskeletal: Normal range of motion in all extremities. No lower extremity edema. Neurologic:  Normal speech and language. No gross focal neurologic deficits are appreciated.  Skin:  Skin is warm, dry and intact. No rash noted. Psychiatric: Mood and affect are normal. Speech and behavior are normal. Patient exhibits appropriate insight and judgment.  ____________________________________________    LABS (pertinent positives/negatives)  CMP wnl except glu 233, ca 8.5 CBC wbc 10.8, hgb 12.8, plt  421 Lipase 23 UA clear, protein 100, rbc and wbc 0-5 ____________________________________________   EKG  None  ____________________________________________    RADIOLOGY  None  ____________________________________________   PROCEDURES  Procedures  ____________________________________________   INITIAL IMPRESSION / ASSESSMENT AND PLAN / ED COURSE  Pertinent labs & imaging results that were available during my care of the patient were reviewed by me and considered in my medical decision making (see chart for details).   Patient presented to the emergency department today because of concerns for abdominal pain.  This is a pain patient has had before and does carry a diagnosis of IBS.  Blood work today without any significant leukocytosis.  On exam there is some slight tenderness palpation on the right side.  Discussed possibility of obtaining imaging  with the patient.  At this point however I do have low suspicion for significant intra-abdominal infection.  Patient felt comfortable trying medication and reassessing.  Patient states she did feel better after sucralfate.  Discussed that we would give patient prescription for IBS.  Discussed return precautions.  ____________________________________________   FINAL CLINICAL IMPRESSION(S) / ED DIAGNOSES  Final diagnoses:  Generalized abdominal pain     Note: This dictation was prepared with Dragon dictation. Any transcriptional errors that result from this process are unintentional     Nance Pear, MD 01/11/18 2014

## 2018-01-11 NOTE — Discharge Instructions (Addendum)
Please seek medical attention for any high fevers, chest pain, shortness of breath, change in behavior, persistent vomiting, bloody stool or any other new or concerning symptoms.  

## 2018-01-13 ENCOUNTER — Ambulatory Visit: Payer: Medicare Other | Admitting: Occupational Therapy

## 2018-01-15 ENCOUNTER — Encounter: Payer: Medicare Other | Admitting: Occupational Therapy

## 2018-01-19 ENCOUNTER — Encounter: Payer: Medicare Other | Admitting: Occupational Therapy

## 2018-01-20 ENCOUNTER — Other Ambulatory Visit: Payer: Self-pay | Admitting: Nurse Practitioner

## 2018-01-20 DIAGNOSIS — K760 Fatty (change of) liver, not elsewhere classified: Secondary | ICD-10-CM

## 2018-01-22 DIAGNOSIS — K2100 Gastro-esophageal reflux disease with esophagitis, without bleeding: Secondary | ICD-10-CM | POA: Insufficient documentation

## 2018-01-26 ENCOUNTER — Ambulatory Visit: Payer: Medicare Other | Admitting: Occupational Therapy

## 2018-01-27 ENCOUNTER — Ambulatory Visit
Admission: RE | Admit: 2018-01-27 | Discharge: 2018-01-27 | Disposition: A | Discharge status: Home / Self Care | Payer: Medicare Other | Source: Ambulatory Visit | Attending: Nurse Practitioner | Admitting: Nurse Practitioner

## 2018-01-27 DIAGNOSIS — K760 Fatty (change of) liver, not elsewhere classified: Secondary | ICD-10-CM | POA: Diagnosis present

## 2018-02-11 ENCOUNTER — Other Ambulatory Visit: Payer: Self-pay | Admitting: Nurse Practitioner

## 2018-02-11 DIAGNOSIS — R1013 Epigastric pain: Secondary | ICD-10-CM

## 2018-02-11 DIAGNOSIS — R109 Unspecified abdominal pain: Secondary | ICD-10-CM

## 2018-02-11 DIAGNOSIS — R197 Diarrhea, unspecified: Secondary | ICD-10-CM

## 2018-02-17 ENCOUNTER — Encounter: Payer: Medicare Other | Admitting: Occupational Therapy

## 2018-02-19 ENCOUNTER — Ambulatory Visit
Admission: RE | Admit: 2018-02-19 | Discharge: 2018-02-19 | Disposition: A | Discharge status: Home / Self Care | Payer: Medicare Other | Source: Ambulatory Visit | Attending: Nurse Practitioner | Admitting: Nurse Practitioner

## 2018-02-19 DIAGNOSIS — R1013 Epigastric pain: Secondary | ICD-10-CM | POA: Diagnosis present

## 2018-02-19 DIAGNOSIS — R109 Unspecified abdominal pain: Secondary | ICD-10-CM | POA: Diagnosis present

## 2018-02-19 DIAGNOSIS — R197 Diarrhea, unspecified: Secondary | ICD-10-CM | POA: Insufficient documentation

## 2018-03-01 ENCOUNTER — Encounter: Payer: Self-pay | Admitting: Dietician

## 2018-03-01 ENCOUNTER — Encounter: Payer: Medicare Other | Attending: Nurse Practitioner | Admitting: Dietician

## 2018-03-01 VITALS — Ht 64.0 in | Wt 266.3 lb

## 2018-03-01 DIAGNOSIS — Z6841 Body Mass Index (BMI) 40.0 and over, adult: Secondary | ICD-10-CM | POA: Diagnosis not present

## 2018-03-01 DIAGNOSIS — K760 Fatty (change of) liver, not elsewhere classified: Secondary | ICD-10-CM | POA: Diagnosis not present

## 2018-03-01 DIAGNOSIS — E669 Obesity, unspecified: Secondary | ICD-10-CM | POA: Diagnosis not present

## 2018-03-01 DIAGNOSIS — E1169 Type 2 diabetes mellitus with other specified complication: Secondary | ICD-10-CM

## 2018-03-01 DIAGNOSIS — E119 Type 2 diabetes mellitus without complications: Secondary | ICD-10-CM | POA: Insufficient documentation

## 2018-03-01 DIAGNOSIS — Z713 Dietary counseling and surveillance: Secondary | ICD-10-CM | POA: Diagnosis present

## 2018-03-01 DIAGNOSIS — Z794 Long term (current) use of insulin: Secondary | ICD-10-CM

## 2018-03-01 NOTE — Patient Instructions (Addendum)
Eat 3 meals spaced 4-5 hours apart. Use food guide plate and Planning a Balanced Meal to help balance protein(lean meat, peanut butter, cheese), 1-2 starch servings, 1 fruit serving and non-starchy vegetables. Use "Quick and Healthy Meals" for ideas of balanced meals. Try some armchair exercises - Marching in place sitting or standing, doing circular movements with arms. Limit fried foods and added fats such as mayonnaise, margarine and salad dressings.

## 2018-03-01 NOTE — Progress Notes (Signed)
Medical Nutrition Therapy: Visit start time: 1520  end time: 1630  Assessment:  Diagnosis: fatty liver, Type 2 Diabetes, obesity Past medical history: hypertension, lactose intolerant  Psychosocial issues/ stress concerns:  Patient rates her stress as "moderate" and indicates ok as to how well she is dealing with stress. She lives with her twin sister and states she is very helpful. "I don't know what I would do without her".  Preferred learning method:  . Auditory . Hands-on  Current weight: 266.3  Height: 64 in Medications, supplements: see list  Progress and evaluation:  Patient accompanied by her twin sister in for medical nutrition therapy. Her sister prepares the meals. Smt states they have made diet changes in the past month. She has decreased her intake of sweets. "We are not buying the oatmeal sandwich cookies" She stated she chose a grilled chicken sandwich at [facility] instead of fried. She is eating 3 meals/day and has switched to graham crackers or vanilla wafers in place of higher sugar sweets. Her A1c 9/'19 was 9.6. She is checking her glucose before meals and reports ranges of 90's to 186 (this am). She keeps glucose tablets with her in case of low blood sugar.    Physical activity: none; walks with cane; states has had numerous falls in the past year  Dietary Intake:  Usual eating pattern includes 3 meals and 1-2 snacks per day. Dining out frequency: 2-3 meals per week.  Breakfast:  9:00am Quick cooking oatmeal or cheese toast, lite peaches, water Lunch: 12:30-1:00pm- peanut butter crackers on wheat crackers, orange or [place] bacon (2 strips), toast, fruit Snack: 5-6 vanilla wafers Supper:5-6:00pm- steak with gravy, stewed potatoes, broccoli for example. Snack:graham crackers Beverages:  Water (2, 16 oz bottles), sugar-free flavored water  Nutrition Care Education: Diabetes:   Instructed on a meal plan for diabetes using food guide plate and food  models. Stressed importance of consistent meal times and the need to balance protein with starch and non-starchy vegetables. Used food models to show portions. Discussed how following basic principles of diet for diabetes is also appropriate for fatty liver and weight loss efforts. Gave and reviewed "Quick and Healthy Meals" for suggestions to increase variety. Commended on the positive diet change she has made thus far. Demonstrated some armchair exercises she can do since mobility is difficult.   Nutritional Diagnosis:  [place]-3.3 Overweight/obesity As related to inactivity, history of high fat, high sugar choices.  As evidenced by diet history and high BMI..  Intervention:  Eat 3 meals spaced 4-5 hours apart. Use food guide plate and Planning a Balanced Meal to help balance protein(lean meat, peanut butter, cheese), 1-2 starch servings, 1 fruit serving and non-starchy vegetables. Use "Quick and Healthy Meals" for ideas of balanced meals. Try some armchair exercises - Marching in place sitting or standing, doing circular movements with arms. Limit fried foods and added fats such as mayonnaise, margarine and salad dressings.  Education Materials given:  . Plate Planner . Food lists/ Planning A Balanced Meal . Sample meal pattern/ menus . Snacking handout . Goals/ instructions Learner/ who was taught:  . Patient  . Family member: sister  Level of understanding: . Partial understanding; needs review/ practice  Demonstrated degree of understanding via:   Teach back Learning barriers: . None  Willingness to learn/ readiness for change: . Eager, change in progress  Monitoring and Evaluation:  Dietary intake, exercise, , and body weight      follow up: 03/29/18 at 1:30pm

## 2018-03-04 ENCOUNTER — Ambulatory Visit: Payer: Medicare Other | Attending: Student | Admitting: Occupational Therapy

## 2018-03-04 DIAGNOSIS — M25532 Pain in left wrist: Secondary | ICD-10-CM | POA: Diagnosis present

## 2018-03-04 DIAGNOSIS — M25632 Stiffness of left wrist, not elsewhere classified: Secondary | ICD-10-CM | POA: Insufficient documentation

## 2018-03-04 DIAGNOSIS — M25561 Pain in right knee: Secondary | ICD-10-CM | POA: Diagnosis present

## 2018-03-04 DIAGNOSIS — M6281 Muscle weakness (generalized): Secondary | ICD-10-CM | POA: Insufficient documentation

## 2018-03-04 DIAGNOSIS — G8929 Other chronic pain: Secondary | ICD-10-CM | POA: Insufficient documentation

## 2018-03-04 NOTE — Patient Instructions (Signed)
Pt to cont with 2  X day 10 reps  wrist RD, UD, sup and pronation, flexion and extention  -  And elbow flexion and extention 2 lbs  Pt can do 2 sets at home   green putty - pt doing gripping,- pulling , twisting both ways  10 reps - 2 sets   And wear soft splint as needed with heavy act  And night time isotoner gloves

## 2018-03-04 NOTE — Therapy (Signed)
[facility] [address], [place], [address] Phone: [phone]   Fax:  [phone]  Occupational Therapy discharge  Patient Details  Name: Christine Lawson MRN: [id number] Date of Birth: 1960-06-15 No data recorded  Encounter Date: 03/04/2018  OT End of Session - 03/04/18 1650    Visit Number  28    Number of Visits  28    Date for OT Re-Evaluation  03/04/18    OT Start Time  1048    OT Stop Time  1128    OT Time Calculation (min)  40 min    Activity Tolerance  Patient tolerated treatment well    Behavior During Therapy  [facility] for tasks assessed/performed       Past Medical History:  Diagnosis Date  . Diabetes mellitus without complication ([place])   . Endometrial adenocarcinoma ([place])   . GERD (gastroesophageal reflux disease)   . Hematochezia   . Hyperlipidemia   . Hypertension   . IDA (iron deficiency anemia)   . Inguinal lymphadenopathy 07/13/2015  . Irritable bowel syndrome   . Obesity   . Osteoarthritis   . Sleep apnea     Past Surgical History:  Procedure Laterality Date  . ABDOMINAL HYSTERECTOMY  feb 2013  . COLONOSCOPY WITH PROPOFOL N/A 08/02/2015   Procedure: COLONOSCOPY WITH PROPOFOL;  Surgeon: Manya Silvas, MD;  Location: [facility] ENDOSCOPY;  Service: Endoscopy;  Laterality: N/A;  . ESOPHAGOGASTRODUODENOSCOPY (EGD) WITH PROPOFOL N/A 08/02/2015   Procedure: ESOPHAGOGASTRODUODENOSCOPY (EGD) WITH PROPOFOL;  Surgeon: Manya Silvas, MD;  Location: [facility] ENDOSCOPY;  Service: Endoscopy;  Laterality: N/A;    There were no vitals filed for this visit.  Subjective Assessment - 03/04/18 1646    Subjective   I was sick for more than month and did not do my exercises as I should have - but still some pain in wrist and swelling - depending what I do and drop things sometimes - but little better     Patient Stated Goals  I want to be able to use my hand like before - dressing , bathing , cutting food,  lift and carry objects     Currently in Pain?  Yes    Pain Score  3     Pain Location  Wrist    Pain Orientation  Left    Pain Descriptors / Indicators  Aching    Pain Type  Chronic pain    Pain Onset  More than a month ago    Pain Frequency  Intermittent         [facility] OT Assessment - 03/04/18 0001      AROM   Left Wrist Extension  60 Degrees    Left Wrist Flexion  85 Degrees      Strength   Right Hand Grip (lbs)  40    Right Hand Lateral Pinch  13 lbs    Right Hand 3 Point Pinch  12 lbs    Left Hand Grip (lbs)  32    Left Hand Lateral Pinch  15 lbs    Left Hand 3 Point Pinch  11 lbs        Received verbal order from PA Cameron Proud  in Oct  to do dry needling - pt did provide Verbal consent - and Chelsea - PT - that worked with pt before with knee and shoulder done the dry needle on dorsaland volarForearm this date - 4th  session this date Pt  trigger points decrease in extensors - some in flexors - but greatly improved    Prior to needling done2 lbs 20 reps  For wrist RD, UD, sup and pronation, flexion and extention -  No issues per pt  And elbow flexion and extention 2 lbs  20 reps Pt can do 2 sets at home to cont with  green putty - pt doing gripping - pulling , twisting both ways  10 reps  Cont  HEPand sister /pt ed on cont at home   And wear soft splint as needed with heavy act  And fitted with new  night time isotoner gloves                  OT Education - 03/04/18 1650    Education provided  Yes    Education Details  HEP and discharge instruction    Person(s) Educated  Patient   sister   Methods  Explanation;Demonstration;Handout    Comprehension  Returned demonstration;Verbalized understanding       OT Short Term Goals - 03/04/18 1653      OT SHORT TERM GOAL #1   Title  Pain on L wrist decrease to less than 5/10 at rest to wean out of splint     Baseline  3/10 intermittend    Status  Achieved      OT SHORT TERM GOAL  #2   Title  Pt to be ind in HEP to decrease pain , increase AROM in wrist in all planes and increase grip     Status  Achieved        OT Long Term Goals - 03/04/18 1654      OT LONG TERM GOAL #1   Title  Pt wrist AROM and strength increase for pt to use L hand in more than 50% of bathing , cut food, turn doorknob, without increase symptoms     Status  Achieved      OT LONG TERM GOAL #2   Title  L grip  increase with more than 5 lbs and prehension strength increase with more than 3 lbs to use hand in cooking , laudry and cut food     Status  Achieved            Plan - 03/04/18 1651    Clinical Impression Statement  Pt AROM in R wrist and hand WNL - strength in wrist in all planes 4+/5 with some discomfort but do not linger - pt show increase grip and prehension strenght- and decrease trigger points in forearm -pt report using hand and wrist more - pt to cont with HEP - was not seen for more than a month because of some health issues - but pt made progress with HEP - review with pt and sister HEP again and in agreement that can cont at home with HEP - discharge at this time     OT Home Exercise Plan  See pt instruction    Consulted and Agree with Plan of Care  Patient       Patient will benefit from skilled therapeutic intervention in order to improve the following deficits and impairments:     Visit Diagnosis: Muscle weakness (generalized) - Plan: Ot plan of care cert/re-cert  Stiffness of left wrist, not elsewhere classified - Plan: Ot plan of care cert/re-cert  Pain in left wrist - Plan: Ot plan of care cert/re-cert  Chronic pain of right knee - Plan: Ot plan of care cert/re-cert  Problem List Patient Active Problem List   Diagnosis Date Noted  . Lymphedema 01/17/2016  . Venous (peripheral) insufficiency 01/17/2016  . Pain in limb 01/17/2016  . Iron deficiency anemia due to chronic blood loss 01/09/2016  . History of endometrial cancer 08/01/2015  . Endometrial  adenocarcinoma ([place]) 07/13/2015    Class: History of  . Inguinal lymphadenopathy 07/13/2015  . Long term current use of insulin ([place]) 12/12/2013  . Microalbuminuria 12/12/2013  . Diabetes ([place]) 07/28/2013  . BP (high blood pressure) 07/28/2013  . HLD (hyperlipidemia) 07/28/2013  . Adiposity 07/28/2013  . Obstructive apnea 07/28/2013  . Arthritis, degenerative 07/28/2013  . Apnea, sleep 07/28/2013  . Thyroid nodule 07/28/2013    Rosalyn Gess OTR/L,CLT 03/04/2018, 4:56 PM  [facility] [address] [place], [address] Phone: [phone]   Fax:  [phone]  Name: Shye Doty MRN: [id number] Date of Birth: Sep 09, 1960

## 2018-03-12 ENCOUNTER — Other Ambulatory Visit
Admission: RE | Admit: 2018-03-12 | Discharge: 2018-03-12 | Disposition: A | Discharge status: Home / Self Care | Payer: Medicare Other | Source: Ambulatory Visit | Attending: Nurse Practitioner | Admitting: Nurse Practitioner

## 2018-03-12 DIAGNOSIS — R197 Diarrhea, unspecified: Secondary | ICD-10-CM | POA: Insufficient documentation

## 2018-03-12 DIAGNOSIS — R109 Unspecified abdominal pain: Secondary | ICD-10-CM | POA: Diagnosis not present

## 2018-03-12 LAB — GASTROINTESTINAL PANEL BY PCR, STOOL (REPLACES STOOL CULTURE)

## 2018-03-12 LAB — C DIFFICILE QUICK SCREEN W PCR REFLEX
C Diff antigen: NEGATIVE
C Diff interpretation: NOT DETECTED
C Diff toxin: NEGATIVE

## 2018-03-29 ENCOUNTER — Encounter: Payer: Medicare Other | Attending: Nurse Practitioner | Admitting: Dietician

## 2018-03-29 ENCOUNTER — Encounter: Payer: Self-pay | Admitting: Dietician

## 2018-03-29 VITALS — Wt 271.8 lb

## 2018-03-29 DIAGNOSIS — Z794 Long term (current) use of insulin: Secondary | ICD-10-CM

## 2018-03-29 DIAGNOSIS — Z6841 Body Mass Index (BMI) 40.0 and over, adult: Secondary | ICD-10-CM | POA: Diagnosis not present

## 2018-03-29 DIAGNOSIS — Z713 Dietary counseling and surveillance: Secondary | ICD-10-CM | POA: Diagnosis present

## 2018-03-29 DIAGNOSIS — E119 Type 2 diabetes mellitus without complications: Secondary | ICD-10-CM | POA: Diagnosis not present

## 2018-03-29 DIAGNOSIS — E1169 Type 2 diabetes mellitus with other specified complication: Secondary | ICD-10-CM

## 2018-03-29 DIAGNOSIS — E669 Obesity, unspecified: Secondary | ICD-10-CM | POA: Diagnosis not present

## 2018-03-29 DIAGNOSIS — K760 Fatty (change of) liver, not elsewhere classified: Secondary | ICD-10-CM | POA: Insufficient documentation

## 2018-03-29 NOTE — Progress Notes (Signed)
Medical Nutrition Therapy Follow-up visit:  Time with patient: 1330-1430  Visit #:2 ASSESSMENT:  Diagnosis:fatty liver, type 2 diabetes, obesity  Current weight: 271.8 lbsHeight: 64 in  Medications: See list Medical History: hypertension, lactose intolerance Progress and evaluation: Patient in for medical nutrition therapy follow-up. She states she is eating 3 meals per day and 2-3 snacks. She continues to try to limit sweets but states she eats too many graham crackers for snacks.  She gave a food recall of grits, toast, orange for breakfast, 2 large soft tacos for lunch and dinner. Snacks were graham crackers and popcorn for evening snack. She reports additional breakfast choices are oatmeal, toast and fruit or 2 small pancakes with SF syrup, 2 strips [place] bacon and applesauce. She reports that most dinner meals are a meat, starch and non-starchy vegetable with example of meatloaf, cabbage and stewed potatoes. She expressed frustration due to weight gain of 5.5 lbs in past month. She reports her weight at MD's office last week was 266 lbs. When asked, she stated that she has some swelling in her fingers and ankles, "They feel tighter".  She also reports her HgA1c from last week was 7.4, a decrease from 9.6, Sept. '19.   Physical activity:none, high risk for falls. States that she has not tried some of the armchair exercise we discussed at the last visit.  NUTRITION CARE EDUCATION: Weight management/fatty liver: Commended on some of the food choices she is making and that her sister is preparing.Reviewed portions again using the food guide plate and food models. Discussed how snacks can help if meals are delayed but can add unwanted calories if eaten when not hungry. Gave and reviewed a list of snacks that contain no more than 15 gms of carbohydrate. Encouraged again to do armchair exercises to help with weight control and diabetes management. Hypertension: Discussed how to decrease  sodium in her diet and alternative seasonings. Discussed how excessive sodium can cause fluid retention and fluctuations in weight.  INTERVENTION:  Continue to eat 3 meals spaced 4-5 hours apart. Remember to balance meals with 2-3 oz protein, 1-2 servings of starch at lunch and 1 serving of starch at dinner, at least 1 cup of non-starchy vegetable. Can add a serving of fruit with each meal. Can include a small snack between meals but with no more than 15-20 gms of carbohydrate. Look at food label - Total carbohydrate. Refer to list. Avoid a snack if not hungry. Drink unsweetened hot tea. Spaghetti sauce: Brown ground [place] Add:  1 can of no salt added tomato sauce 1 can of no salt added canned tomatoes Add oregano, basil, and parsley  Try again with armchair exercises- Marching or sitting in place, doing circular motions with arms. Keep food records.   EDUCATION MATERIALS GIVEN:  . Snacking handout . Food Records . Goals/ instructions  LEARNER/ who was taught:  . Patient  . Family member sister [name] LEVEL OF UNDERSTANDING: . Partial understanding; needs review/ practice Demonstrated degree of understanding via teach back.  LEARNING BARRIERS: . None WILLINGNESS TO LEARN/READINESS FOR CHANGE:  Change in progress MONITORING AND EVALUATION:   Weight, diet, exercise Follow up: 04/26/18 at 1:15

## 2018-03-29 NOTE — Patient Instructions (Signed)
Continue to eat 3 meals spaced 4-5 hours apart. Remember to balance meals with 2-3 oz protein, 1-2 servings of starch at lunch and 1 serving of starch at dinner, at least 1 cup of non-starchy vegetable. Can add a serving of fruit with each meal. Can include a small snack between meals but with no more than 15-20 gms of carbohydrate. Look at food label - Total carbohydrate. Refer to list. Avoid a snack if not hungry. Drink unsweetened hot tea. Spaghetti sauce: Brown ground [place] Add:  1 can of no salt added tomato sauce 1 can of no salt added canned tomatoes Add oregano, basil, and parsley  Try again with armchair exercises- Marching or sitting in place, doing circular motions with arms. Keep food records.

## 2018-04-13 DIAGNOSIS — E559 Vitamin D deficiency, unspecified: Secondary | ICD-10-CM | POA: Insufficient documentation

## 2018-04-14 ENCOUNTER — Other Ambulatory Visit
Admission: RE | Admit: 2018-04-14 | Discharge: 2018-04-14 | Disposition: A | Discharge status: Home / Self Care | Payer: Medicare Other | Source: Ambulatory Visit | Attending: Nurse Practitioner | Admitting: Nurse Practitioner

## 2018-04-14 DIAGNOSIS — K580 Irritable bowel syndrome with diarrhea: Secondary | ICD-10-CM | POA: Insufficient documentation

## 2018-04-16 LAB — PANCREATIC ELASTASE, FECAL: Pancreatic Elastase-1, Stool: 204 ug Elast./g (ref >200)

## 2018-04-23 ENCOUNTER — Inpatient Hospital Stay: Payer: Medicare Other

## 2018-04-26 ENCOUNTER — Other Ambulatory Visit: Payer: Self-pay | Admitting: Internal Medicine

## 2018-04-26 ENCOUNTER — Inpatient Hospital Stay: Payer: Medicare Other

## 2018-04-26 ENCOUNTER — Inpatient Hospital Stay: Payer: Medicare Other | Admitting: Internal Medicine

## 2018-04-27 ENCOUNTER — Other Ambulatory Visit: Payer: Medicare Other

## 2018-04-27 ENCOUNTER — Inpatient Hospital Stay: Payer: Medicare Other | Attending: Internal Medicine

## 2018-04-27 ENCOUNTER — Ambulatory Visit: Payer: Medicare Other | Admitting: Dietician

## 2018-04-27 ENCOUNTER — Ambulatory Visit: Payer: Medicare Other | Admitting: Internal Medicine

## 2018-04-27 ENCOUNTER — Ambulatory Visit: Payer: Medicare Other

## 2018-04-27 DIAGNOSIS — D500 Iron deficiency anemia secondary to blood loss (chronic): Secondary | ICD-10-CM | POA: Diagnosis present

## 2018-04-27 LAB — CBC WITH DIFFERENTIAL/PLATELET
Abs Immature Granulocytes: 0.07 10*3/uL (ref 0.00–0.07)
Basophils Absolute: 0.1 10*3/uL (ref 0.0–0.1)
Basophils Relative: 1 %
Eosinophils Absolute: 0.2 10*3/uL (ref 0.0–0.5)
Eosinophils Relative: 2 %
HCT: 42.3 % (ref 36.0–46.0)
Hemoglobin: 13.2 g/dL (ref 12.0–15.0)
Immature Granulocytes: 1 %
Lymphocytes Relative: 15 %
Lymphs Abs: 1.5 10*3/uL (ref 0.7–4.0)
MCH: 26.1 pg (ref 26.0–34.0)
MCHC: 31.2 g/dL (ref 30.0–36.0)
MCV: 83.6 fL (ref 80.0–100.0)
Monocytes Absolute: 0.5 10*3/uL (ref 0.1–1.0)
Monocytes Relative: 4 %
NEUTROS ABS: 8.1 10*3/uL — AB (ref 1.7–7.7)
Neutrophils Relative %: 77 %
Platelets: 454 10*3/uL — ABNORMAL HIGH (ref 150–400)
RBC: 5.06 MIL/uL (ref 3.87–5.11)
RDW: 13.5 % (ref 11.5–15.5)
WBC: 10.4 10*3/uL (ref 4.0–10.5)
nRBC: 0 % (ref 0.0–0.2)

## 2018-04-27 LAB — IRON AND TIBC
Iron: 68 ug/dL (ref 28–170)
Saturation Ratios: 19 % (ref 10.4–31.8)
TIBC: 364 ug/dL (ref 250–450)
UIBC: 296 ug/dL

## 2018-04-27 LAB — BASIC METABOLIC PANEL
Anion gap: 10 (ref 5–15)
BUN: 12 mg/dL (ref 6–20)
CO2: 27 mmol/L (ref 22–32)
Calcium: 9 mg/dL (ref 8.9–10.3)
Chloride: 100 mmol/L (ref 98–111)
Creatinine, Ser: 0.66 mg/dL (ref 0.44–1.00)
GFR calc Af Amer: >60 mL/min (ref >60)
GFR calc non Af Amer: >60 mL/min (ref >60)
Glucose, Bld: 239 mg/dL — ABNORMAL HIGH (ref 70–99)
POTASSIUM: 3.7 mmol/L (ref 3.5–5.1)
Sodium: 137 mmol/L (ref 135–145)

## 2018-04-27 LAB — FERRITIN: Ferritin: 367 ng/mL — ABNORMAL HIGH (ref 11–307)

## 2018-04-28 ENCOUNTER — Inpatient Hospital Stay: Payer: Medicare Other

## 2018-04-28 ENCOUNTER — Inpatient Hospital Stay: Payer: Medicare Other | Admitting: Internal Medicine

## 2018-05-07 ENCOUNTER — Inpatient Hospital Stay: Payer: Medicare Other | Attending: Internal Medicine | Admitting: Internal Medicine

## 2018-05-07 ENCOUNTER — Inpatient Hospital Stay: Payer: Medicare Other

## 2018-05-07 ENCOUNTER — Encounter: Payer: Self-pay | Admitting: Internal Medicine

## 2018-05-07 ENCOUNTER — Other Ambulatory Visit: Payer: Self-pay

## 2018-05-07 VITALS — BP 132/65 | HR 88 | Temp 97.0°F | Resp 18

## 2018-05-07 DIAGNOSIS — D500 Iron deficiency anemia secondary to blood loss (chronic): Secondary | ICD-10-CM

## 2018-05-07 DIAGNOSIS — Z8542 Personal history of malignant neoplasm of other parts of uterus: Secondary | ICD-10-CM

## 2018-05-07 DIAGNOSIS — D509 Iron deficiency anemia, unspecified: Secondary | ICD-10-CM | POA: Diagnosis present

## 2018-05-07 NOTE — Assessment & Plan Note (Addendum)
Iron deficiency anemia-question etiology. Today 13 ;ferritin-367 stable. Continue by mouth iron PO BID.  No IV iron today.   # History of endometrial cancer status post surgery-no adjuvant therapy; recent CT -NEG; contonue follow up with Gyn-Onc.  Stable   DISPOSITION: labs print out # NO infusion  #  follow-up in 6 months with MD- iron studies-Ferritin/CBC BMP; possible ferrahem IV- Dr.B

## 2018-05-07 NOTE — Progress Notes (Signed)
[place] OFFICE PROGRESS NOTE  Patient Care Team: Tracie Harrier, MD as PCP - General (Internal Medicine) Clent Jacks, RN as Registered Nurse  Cancer Staging No matching staging information was found for the patient.     Endometrial adenocarcinoma ([place])   07/13/2015 Initial Diagnosis    Endometrial adenocarcinoma ([place])    Anemia of iron deficiency unresponsive to oral iron therapy - got IV Venofer 1 g in Feb 2015, then on oral Ferrous sulfate 1 tablet daily. (labs on 03/28/13 had showed hemoglobin 10.0, MCV 71.3, WBC 10,800, ANC 7990, ALC 1640, platelet count 564, ferritin 27, serum iron 33, iron saturation low at 7%, TIBC 452. In June 2014 - hemoglobin 9.8, WBC 10,700, platelets 551, LFT unremarkable except alkaline phos of 120, creatinine 0.6). Stool Hemoccult was negative x2 in November 2014.  Colonoscopy July 2013 had shown internal hemorrhoids.  INTERVAL HISTORY:  Christine Lawson 58 y.o.  female pleasant patient above history of iron deficiency anemia; also history of early stage endometrial cancer is here for follow-up.  No unusual fatigue.  Denies any blood in stools or black or stools.  No chest pain shortness of breath or cough.  Continues to be on iron pills.  Review of Systems  Constitutional: Negative for chills, diaphoresis, fever, malaise/fatigue and weight loss.  HENT: Negative for nosebleeds and sore throat.   Eyes: Negative for double vision.  Respiratory: Negative for cough, hemoptysis, sputum production, shortness of breath and wheezing.   Cardiovascular: Negative for chest pain, palpitations, orthopnea and leg swelling.  Gastrointestinal: Negative for abdominal pain, blood in stool, constipation, diarrhea, heartburn, melena, nausea and vomiting.  Genitourinary: Negative for dysuria, frequency and urgency.  Musculoskeletal: Negative for back pain and joint pain.  Skin: Negative.  Negative for itching and rash.  Neurological: Negative  for dizziness, tingling, focal weakness, weakness and headaches.  Endo/Heme/Allergies: Does not bruise/bleed easily.  Psychiatric/Behavioral: Negative for depression. The patient is not nervous/anxious and does not have insomnia.       PAST MEDICAL HISTORY :  Past Medical History:  Diagnosis Date  . Diabetes mellitus without complication ([place])   . Endometrial adenocarcinoma ([place])   . GERD (gastroesophageal reflux disease)   . Hematochezia   . Hyperlipidemia   . Hypertension   . IDA (iron deficiency anemia)   . Inguinal lymphadenopathy 07/13/2015  . Irritable bowel syndrome   . Obesity   . Osteoarthritis   . Sleep apnea     PAST SURGICAL HISTORY :   Past Surgical History:  Procedure Laterality Date  . ABDOMINAL HYSTERECTOMY  feb 2013  . COLONOSCOPY WITH PROPOFOL N/A 08/02/2015   Procedure: COLONOSCOPY WITH PROPOFOL;  Surgeon: Manya Silvas, MD;  Location: [facility]COPY;  Service: Endoscopy;  Laterality: N/A;  . ESOPHAGOGASTRODUODENOSCOPY (EGD) WITH PROPOFOL N/A 08/02/2015   Procedure: ESOPHAGOGASTRODUODENOSCOPY (EGD) WITH PROPOFOL;  Surgeon: Manya Silvas, MD;  Location: [facility] ENDOSCOPY;  Service: Endoscopy;  Laterality: N/A;    FAMILY HISTORY :   Family History  Problem Relation Age of Onset  . Diabetes Mother   . Diabetes Father     SOCIAL HISTORY:   Social History   Tobacco Use  . Smoking status: Never Smoker  . Smokeless tobacco: Never Used  Substance Use Topics  . Alcohol use: No  . Drug use: No    ALLERGIES:  is allergic to solifenacin; contrast media [iodinated diagnostic agents]; dicyclomine; and isovue [iopamidol].  MEDICATIONS:  Current Outpatient Medications  Medication Sig Dispense Refill  .  enalapril (VASOTEC) 2.5 MG tablet Take 2.5 mg by mouth daily.     . ferrous sulfate 325 (65 FE) MG tablet Take 325 mg by mouth 2 (two) times daily with a meal.     . gabapentin (NEURONTIN) 300 MG capsule Take 300 mg by mouth at bedtime.    . insulin aspart  (NOVOLOG) 100 UNIT/ML injection Inject 20 Units into the skin 3 (three) times daily before meals. 26 units in am, 24 units at lunch and 60 units at supper    . Insulin Glargine (LANTUS SOLOSTAR) 100 UNIT/ML Solostar Pen Inject 70 Units as directed at bedtime. 03/01/18 60 units at bedtime    . Lifitegrast (XIIDRA) 5 % SOLN Apply 1 drop to eye as needed for dry eyes.    . metFORMIN (GLUCOPHAGE-XR) 500 MG 24 hr tablet Take 2 tablets by mouth 2 (two) times daily. 03/01/18 States she is taking 2 x per week    . omeprazole (PRILOSEC) 40 MG capsule Take 40 mg by mouth daily.     . vitamin B-12 (CYANOCOBALAMIN) 500 MCG tablet Take 1 tablet by mouth daily.    Marland Kitchen albuterol (PROVENTIL HFA;VENTOLIN HFA) 108 (90 Base) MCG/ACT inhaler Inhale 1 puff into the lungs every 6 (six) hours as needed for wheezing or shortness of breath.    . allopurinol (ZYLOPRIM) 100 MG tablet Take 100 mg by mouth daily.     Marland Kitchen amLODipine (NORVASC) 10 MG tablet Take 10 mg by mouth daily.    Marland Kitchen aspirin (ASPIRIN EC) 81 MG EC tablet Take 81 mg by mouth daily. Swallow whole.    . cholecalciferol (VITAMIN D) 1000 UNITS tablet Take 1,000 Units by mouth daily.    . diclofenac sodium (VOLTAREN) 1 % GEL Apply 1 application topically 2 (two) times daily as needed for pain.     No current facility-administered medications for this visit.     PHYSICAL EXAMINATION: ECOG PERFORMANCE STATUS: 0 - Asymptomatic  BP 132/65   Pulse 88   Temp (!) 97 F (36.1 C) (Tympanic)   Resp 18   There were no vitals filed for this visit.  Physical Exam  Constitutional: She is oriented to person, place, and time and well-developed, well-nourished, and in no distress.  Accompanied by her twin sister.  HENT:  Head: Normocephalic and atraumatic.  Mouth/Throat: Oropharynx is clear and moist. No oropharyngeal exudate.  Eyes: Pupils are equal, round, and reactive to light.  Neck: Normal range of motion. Neck supple.  Cardiovascular: Normal rate and regular  rhythm.  Pulmonary/Chest: No respiratory distress. She has no wheezes.  Abdominal: Soft. Bowel sounds are normal. She exhibits no distension and no mass. There is no abdominal tenderness. There is no rebound and no guarding.  Musculoskeletal: Normal range of motion.        General: No tenderness or edema.  Neurological: She is alert and oriented to person, place, and time.  Skin: Skin is warm.  Psychiatric: Affect normal.    LABORATORY DATA:  I have reviewed the data as listed    Component Value Date/Time   NA 137 04/27/2018 1013   NA 141 06/03/2013 2039   K 3.7 04/27/2018 1013   K 3.7 06/03/2013 2039   CL 100 04/27/2018 1013   CL 106 06/03/2013 2039   CO2 27 04/27/2018 1013   CO2 30 06/03/2013 2039   GLUCOSE 239 (H) 04/27/2018 1013   GLUCOSE 59 (L) 06/03/2013 2039   BUN 12 04/27/2018 1013   BUN 6 (L) 06/03/2013  2039   CREATININE 0.66 04/27/2018 1013   CREATININE 0.59 (L) 06/03/2013 2039   CALCIUM 9.0 04/27/2018 1013   CALCIUM 8.8 06/03/2013 2039   PROT 8.0 01/11/2018 1552   PROT 8.8 (H) 06/03/2013 2039   ALBUMIN 3.6 01/11/2018 1552   ALBUMIN 3.6 06/03/2013 2039   AST 38 01/11/2018 1552   AST 38 (H) 06/03/2013 2039   ALT 28 01/11/2018 1552   ALT 44 06/03/2013 2039   ALKPHOS 102 01/11/2018 1552   ALKPHOS 134 (H) 06/03/2013 2039   BILITOT 0.8 01/11/2018 1552   BILITOT 0.4 06/03/2013 2039   GFRNONAA >60 04/27/2018 1013   GFRNONAA >60 06/03/2013 2039   GFRAA >60 04/27/2018 1013   GFRAA >60 06/03/2013 2039    No results found for: SPEP, UPEP  Lab Results  Component Value Date   WBC 10.4 04/27/2018   NEUTROABS 8.1 (H) 04/27/2018   HGB 13.2 04/27/2018   HCT 42.3 04/27/2018   MCV 83.6 04/27/2018   PLT 454 (H) 04/27/2018      Chemistry      Component Value Date/Time   NA 137 04/27/2018 1013   NA 141 06/03/2013 2039   K 3.7 04/27/2018 1013   K 3.7 06/03/2013 2039   CL 100 04/27/2018 1013   CL 106 06/03/2013 2039   CO2 27 04/27/2018 1013   CO2 30 06/03/2013  2039   BUN 12 04/27/2018 1013   BUN 6 (L) 06/03/2013 2039   CREATININE 0.66 04/27/2018 1013   CREATININE 0.59 (L) 06/03/2013 2039      Component Value Date/Time   CALCIUM 9.0 04/27/2018 1013   CALCIUM 8.8 06/03/2013 2039   ALKPHOS 102 01/11/2018 1552   ALKPHOS 134 (H) 06/03/2013 2039   AST 38 01/11/2018 1552   AST 38 (H) 06/03/2013 2039   ALT 28 01/11/2018 1552   ALT 44 06/03/2013 2039   BILITOT 0.8 01/11/2018 1552   BILITOT 0.4 06/03/2013 2039       RADIOGRAPHIC STUDIES: I have personally reviewed the radiological images as listed and agreed with the findings in the report. No results found.   ASSESSMENT & PLAN:  Iron deficiency anemia due to chronic blood loss Iron deficiency anemia-question etiology. Today 13 ;ferritin-367 stable. Continue by mouth iron PO BID.  No IV iron today.   # History of endometrial cancer status post surgery-no adjuvant therapy; recent CT -NEG; contonue follow up with Gyn-Onc.  Stable   DISPOSITION: labs print out # NO infusion  #  follow-up in 6 months with MD- iron studies-Ferritin/CBC BMP; possible ferrahem IV- Dr.B   Orders Placed This Encounter  Procedures  . CBC with Differential/Platelet    Standing Status:   Future    Standing Expiration Date:   05/07/2019  . Comprehensive metabolic panel    Standing Status:   Future    Standing Expiration Date:   05/07/2019  . Iron and TIBC    Standing Status:   Future    Standing Expiration Date:   05/07/2019  . Ferritin    Standing Status:   Future    Standing Expiration Date:   05/07/2019   All questions were answered. The patient knows to call the clinic with any problems, questions or concerns.      Cammie Sickle, MD 05/13/2018 7:30 PM

## 2018-05-18 ENCOUNTER — Ambulatory Visit: Payer: Medicare Other | Admitting: Dietician

## 2018-06-08 ENCOUNTER — Encounter: Payer: Self-pay | Admitting: Dietician

## 2018-06-08 ENCOUNTER — Ambulatory Visit: Payer: Medicare Other | Admitting: Dietician

## 2018-09-17 ENCOUNTER — Other Ambulatory Visit: Payer: Self-pay | Admitting: Internal Medicine

## 2018-09-17 DIAGNOSIS — Z1231 Encounter for screening mammogram for malignant neoplasm of breast: Secondary | ICD-10-CM

## 2018-10-29 ENCOUNTER — Ambulatory Visit
Admission: RE | Admit: 2018-10-29 | Discharge: 2018-10-29 | Disposition: A | Discharge status: Home / Self Care | Payer: Medicare Other | Source: Ambulatory Visit | Attending: Internal Medicine | Admitting: Internal Medicine

## 2018-10-29 DIAGNOSIS — Z1231 Encounter for screening mammogram for malignant neoplasm of breast: Secondary | ICD-10-CM

## 2018-11-02 ENCOUNTER — Inpatient Hospital Stay: Payer: Medicare Other

## 2018-11-04 ENCOUNTER — Inpatient Hospital Stay: Payer: Medicare Other

## 2018-11-05 ENCOUNTER — Inpatient Hospital Stay: Payer: Medicare Other | Admitting: Internal Medicine

## 2018-11-05 ENCOUNTER — Inpatient Hospital Stay: Payer: Medicare Other

## 2018-11-11 ENCOUNTER — Other Ambulatory Visit: Payer: Medicare Other

## 2018-11-16 ENCOUNTER — Other Ambulatory Visit: Payer: Self-pay

## 2018-11-16 ENCOUNTER — Encounter: Payer: Self-pay | Admitting: Internal Medicine

## 2018-11-16 NOTE — Progress Notes (Signed)
Patient pre screened for office appointment, no questions or concerns today. Patient is not a reliable historian.

## 2018-11-17 ENCOUNTER — Inpatient Hospital Stay (HOSPITAL_BASED_OUTPATIENT_CLINIC_OR_DEPARTMENT_OTHER): Payer: Medicare Other | Admitting: Internal Medicine

## 2018-11-17 ENCOUNTER — Inpatient Hospital Stay: Payer: Medicare Other | Attending: Internal Medicine

## 2018-11-17 ENCOUNTER — Inpatient Hospital Stay: Payer: Medicare Other

## 2018-11-17 ENCOUNTER — Other Ambulatory Visit: Payer: Self-pay

## 2018-11-17 DIAGNOSIS — Z833 Family history of diabetes mellitus: Secondary | ICD-10-CM | POA: Insufficient documentation

## 2018-11-17 DIAGNOSIS — E669 Obesity, unspecified: Secondary | ICD-10-CM | POA: Diagnosis not present

## 2018-11-17 DIAGNOSIS — Z79899 Other long term (current) drug therapy: Secondary | ICD-10-CM | POA: Insufficient documentation

## 2018-11-17 DIAGNOSIS — M1990 Unspecified osteoarthritis, unspecified site: Secondary | ICD-10-CM | POA: Insufficient documentation

## 2018-11-17 DIAGNOSIS — R0602 Shortness of breath: Secondary | ICD-10-CM | POA: Diagnosis not present

## 2018-11-17 DIAGNOSIS — E119 Type 2 diabetes mellitus without complications: Secondary | ICD-10-CM | POA: Diagnosis not present

## 2018-11-17 DIAGNOSIS — Z8542 Personal history of malignant neoplasm of other parts of uterus: Secondary | ICD-10-CM | POA: Insufficient documentation

## 2018-11-17 DIAGNOSIS — C541 Malignant neoplasm of endometrium: Secondary | ICD-10-CM | POA: Insufficient documentation

## 2018-11-17 DIAGNOSIS — D500 Iron deficiency anemia secondary to blood loss (chronic): Secondary | ICD-10-CM

## 2018-11-17 LAB — COMPREHENSIVE METABOLIC PANEL
ALT: 24 U/L (ref 0–44)
AST: 26 U/L (ref 15–41)
Albumin: 3.6 g/dL (ref 3.5–5.0)
Alkaline Phosphatase: 118 U/L (ref 38–126)
Anion gap: 8 (ref 5–15)
BUN: 11 mg/dL (ref 6–20)
CO2: 30 mmol/L (ref 22–32)
Calcium: 8.8 mg/dL — ABNORMAL LOW (ref 8.9–10.3)
Chloride: 102 mmol/L (ref 98–111)
Creatinine, Ser: 0.75 mg/dL (ref 0.44–1.00)
GFR calc Af Amer: >60 mL/min (ref >60)
GFR calc non Af Amer: >60 mL/min (ref >60)
Glucose, Bld: 120 mg/dL — ABNORMAL HIGH (ref 70–99)
Potassium: 3.7 mmol/L (ref 3.5–5.1)
Sodium: 140 mmol/L (ref 135–145)
Total Bilirubin: 0.7 mg/dL (ref 0.3–1.2)
Total Protein: 7.9 g/dL (ref 6.5–8.1)

## 2018-11-17 LAB — IRON AND TIBC
Iron: 55 ug/dL (ref 28–170)
Saturation Ratios: 19 % (ref 10.4–31.8)
TIBC: 288 ug/dL (ref 250–450)
UIBC: 233 ug/dL

## 2018-11-17 LAB — CBC WITH DIFFERENTIAL/PLATELET
Abs Immature Granulocytes: 0.04 10*3/uL (ref 0.00–0.07)
Basophils Absolute: 0.1 10*3/uL (ref 0.0–0.1)
Basophils Relative: 1 %
Eosinophils Absolute: 0.4 10*3/uL (ref 0.0–0.5)
Eosinophils Relative: 4 %
HCT: 38.3 % (ref 36.0–46.0)
Hemoglobin: 11.9 g/dL — ABNORMAL LOW (ref 12.0–15.0)
Immature Granulocytes: 0 %
Lymphocytes Relative: 18 %
Lymphs Abs: 2.2 10*3/uL (ref 0.7–4.0)
MCH: 25.6 pg — ABNORMAL LOW (ref 26.0–34.0)
MCHC: 31.1 g/dL (ref 30.0–36.0)
MCV: 82.4 fL (ref 80.0–100.0)
Monocytes Absolute: 0.9 10*3/uL (ref 0.1–1.0)
Monocytes Relative: 8 %
Neutro Abs: 8.4 10*3/uL — ABNORMAL HIGH (ref 1.7–7.7)
Neutrophils Relative %: 69 %
Platelets: 414 10*3/uL — ABNORMAL HIGH (ref 150–400)
RBC: 4.65 MIL/uL (ref 3.87–5.11)
RDW: 13.7 % (ref 11.5–15.5)
WBC: 12 10*3/uL — ABNORMAL HIGH (ref 4.0–10.5)
nRBC: 0 % (ref 0.0–0.2)

## 2018-11-17 LAB — FERRITIN: Ferritin: 351 ng/mL — ABNORMAL HIGH (ref 11–307)

## 2018-11-17 NOTE — Progress Notes (Signed)
[place] OFFICE PROGRESS NOTE  Patient Care Team: Tracie Harrier, MD as PCP - General (Internal Medicine) Clent Jacks, RN as Registered Nurse  Cancer Staging No matching staging information was found for the patient.   Oncology History  Endometrial adenocarcinoma ([place])  07/13/2015 Initial Diagnosis   Endometrial adenocarcinoma ([place])    Anemia of iron deficiency unresponsive to oral iron therapy - got IV Venofer 1 g in Feb 2015, then on oral Ferrous sulfate 1 tablet daily. (labs on 03/28/13 had showed hemoglobin 10.0, MCV 71.3, WBC 10,800, ANC 7990, ALC 1640, platelet count 564, ferritin 27, serum iron 33, iron saturation low at 7%, TIBC 452. In June 2014 - hemoglobin 9.8, WBC 10,700, platelets 551, LFT unremarkable except alkaline phos of 120, creatinine 0.6). Stool Hemoccult was negative x2 in November 2014.  Colonoscopy July 2013 had shown internal hemorrhoids.  INTERVAL HISTORY:  Christine Lawson 58 y.o.  female pleasant patient above history of iron deficiency anemia; also history of early stage endometrial cancer is here for follow-up.  Patient denies any blood in stools or black or stools.  She has chronic mild shortness of breath with exertion.  Not any worse.  She continues to be on iron pills twice a day.  Review of Systems  Constitutional: Negative for chills, diaphoresis, fever, malaise/fatigue and weight loss.  HENT: Negative for nosebleeds and sore throat.   Eyes: Negative for double vision.  Respiratory: Positive for shortness of breath. Negative for cough, hemoptysis, sputum production and wheezing.   Cardiovascular: Negative for chest pain, palpitations, orthopnea and leg swelling.  Gastrointestinal: Negative for abdominal pain, blood in stool, constipation, diarrhea, heartburn, melena, nausea and vomiting.  Genitourinary: Negative for dysuria, frequency and urgency.  Musculoskeletal: Negative for back pain and joint pain.  Skin: Negative.   Negative for itching and rash.  Neurological: Negative for dizziness, tingling, focal weakness, weakness and headaches.  Endo/Heme/Allergies: Does not bruise/bleed easily.  Psychiatric/Behavioral: Negative for depression. The patient is not nervous/anxious and does not have insomnia.       PAST MEDICAL HISTORY :  Past Medical History:  Diagnosis Date  . Diabetes mellitus without complication ([place])   . Endometrial adenocarcinoma ([place])   . GERD (gastroesophageal reflux disease)   . Hematochezia   . Hyperlipidemia   . Hypertension   . IDA (iron deficiency anemia)   . Inguinal lymphadenopathy 07/13/2015  . Irritable bowel syndrome   . Obesity   . Osteoarthritis   . Sleep apnea     PAST SURGICAL HISTORY :   Past Surgical History:  Procedure Laterality Date  . ABDOMINAL HYSTERECTOMY  feb 2013  . COLONOSCOPY WITH PROPOFOL N/A 08/02/2015   Procedure: COLONOSCOPY WITH PROPOFOL;  Surgeon: Manya Silvas, MD;  Location: [facility] ENDOSCOPY;  Service: Endoscopy;  Laterality: N/A;  . ESOPHAGOGASTRODUODENOSCOPY (EGD) WITH PROPOFOL N/A 08/02/2015   Procedure: ESOPHAGOGASTRODUODENOSCOPY (EGD) WITH PROPOFOL;  Surgeon: Manya Silvas, MD;  Location: [facility] ENDOSCOPY;  Service: Endoscopy;  Laterality: N/A;    FAMILY HISTORY :   Family History  Problem Relation Age of Onset  . Diabetes Mother   . Diabetes Father   . Breast cancer Neg Hx     SOCIAL HISTORY:   Social History   Tobacco Use  . Smoking status: Never Smoker  . Smokeless tobacco: Never Used  Substance Use Topics  . Alcohol use: No  . Drug use: No    ALLERGIES:  is allergic to solifenacin; contrast media [iodinated diagnostic agents]; dicyclomine; and isovue [  iopamidol].  MEDICATIONS:  Current Outpatient Medications  Medication Sig Dispense Refill  . albuterol (PROVENTIL HFA;VENTOLIN HFA) 108 (90 Base) MCG/ACT inhaler Inhale 1 puff into the lungs every 6 (six) hours as needed for wheezing or shortness of breath.    .  allopurinol (ZYLOPRIM) 100 MG tablet Take 100 mg by mouth daily.     Marland Kitchen amLODipine (NORVASC) 10 MG tablet Take 10 mg by mouth daily.    Marland Kitchen aspirin (ASPIRIN EC) 81 MG EC tablet Take 81 mg by mouth daily. Swallow whole.    . cholecalciferol (VITAMIN D) 1000 UNITS tablet Take 1,000 Units by mouth daily.    . diclofenac sodium (VOLTAREN) 1 % GEL Apply 1 application topically 2 (two) times daily as needed for pain.    Marland Kitchen enalapril (VASOTEC) 2.5 MG tablet Take 2.5 mg by mouth daily.     . ferrous sulfate 325 (65 FE) MG tablet Take 325 mg by mouth 2 (two) times daily with a meal.     . gabapentin (NEURONTIN) 300 MG capsule Take 300 mg by mouth at bedtime.    . insulin aspart (NOVOLOG) 100 UNIT/ML injection Inject 20 Units into the skin 3 (three) times daily before meals. 26 units in am, 24 units at lunch and 60 units at supper    . Insulin Glargine (LANTUS SOLOSTAR) 100 UNIT/ML Solostar Pen Inject 70 Units as directed at bedtime. 03/01/18 60 units at bedtime    . Lifitegrast (XIIDRA) 5 % SOLN Apply 1 drop to eye as needed for dry eyes.    . metFORMIN (GLUCOPHAGE-XR) 500 MG 24 hr tablet Take 2 tablets by mouth 2 (two) times daily. 03/01/18 States she is taking 2 x per week    . omeprazole (PRILOSEC) 40 MG capsule Take 40 mg by mouth daily.     . vitamin B-12 (CYANOCOBALAMIN) 500 MCG tablet Take 1 tablet by mouth daily.     No current facility-administered medications for this visit.     PHYSICAL EXAMINATION: ECOG PERFORMANCE STATUS: 0 - Asymptomatic  BP (!) 142/80 (BP Location: Right Arm, Patient Position: Sitting, Cuff Size: Normal)   Pulse 87   Temp 98.3 F (36.8 C) (Tympanic)   Resp (!) 22   Ht 5' 4.02" (1.626 m)   Wt 275 lb (124.7 kg)   BMI 47.18 kg/m   Filed Weights   11/17/18 1333  Weight: 275 lb (124.7 kg)    Physical Exam  Constitutional: She is oriented to person, place, and time and well-developed, well-nourished, and in no distress.  Alone.  Walking with a cane.  Obese.  HENT:   Head: Normocephalic and atraumatic.  Mouth/Throat: Oropharynx is clear and moist. No oropharyngeal exudate.  Eyes: Pupils are equal, round, and reactive to light.  Neck: Normal range of motion. Neck supple.  Cardiovascular: Normal rate and regular rhythm.  Pulmonary/Chest: No respiratory distress. She has no wheezes.  Abdominal: Soft. Bowel sounds are normal. She exhibits no distension and no mass. There is no abdominal tenderness. There is no rebound and no guarding.  Musculoskeletal: Normal range of motion.        General: No tenderness or edema.  Neurological: She is alert and oriented to person, place, and time.  Skin: Skin is warm.  Psychiatric: Affect normal.    LABORATORY DATA:  I have reviewed the data as listed    Component Value Date/Time   NA 140 11/17/2018 1313   NA 141 06/03/2013 2039   K 3.7 11/17/2018 1313   K  3.7 06/03/2013 2039   CL 102 11/17/2018 1313   CL 106 06/03/2013 2039   CO2 30 11/17/2018 1313   CO2 30 06/03/2013 2039   GLUCOSE 120 (H) 11/17/2018 1313   GLUCOSE 59 (L) 06/03/2013 2039   BUN 11 11/17/2018 1313   BUN 6 (L) 06/03/2013 2039   CREATININE 0.75 11/17/2018 1313   CREATININE 0.59 (L) 06/03/2013 2039   CALCIUM 8.8 (L) 11/17/2018 1313   CALCIUM 8.8 06/03/2013 2039   PROT 7.9 11/17/2018 1313   PROT 8.8 (H) 06/03/2013 2039   ALBUMIN 3.6 11/17/2018 1313   ALBUMIN 3.6 06/03/2013 2039   AST 26 11/17/2018 1313   AST 38 (H) 06/03/2013 2039   ALT 24 11/17/2018 1313   ALT 44 06/03/2013 2039   ALKPHOS 118 11/17/2018 1313   ALKPHOS 134 (H) 06/03/2013 2039   BILITOT 0.7 11/17/2018 1313   BILITOT 0.4 06/03/2013 2039   GFRNONAA >60 11/17/2018 1313   GFRNONAA >60 06/03/2013 2039   GFRAA >60 11/17/2018 1313   GFRAA >60 06/03/2013 2039    No results found for: SPEP, UPEP  Lab Results  Component Value Date   WBC 12.0 (H) 11/17/2018   NEUTROABS 8.4 (H) 11/17/2018   HGB 11.9 (L) 11/17/2018   HCT 38.3 11/17/2018   MCV 82.4 11/17/2018   PLT 414  (H) 11/17/2018      Chemistry      Component Value Date/Time   NA 140 11/17/2018 1313   NA 141 06/03/2013 2039   K 3.7 11/17/2018 1313   K 3.7 06/03/2013 2039   CL 102 11/17/2018 1313   CL 106 06/03/2013 2039   CO2 30 11/17/2018 1313   CO2 30 06/03/2013 2039   BUN 11 11/17/2018 1313   BUN 6 (L) 06/03/2013 2039   CREATININE 0.75 11/17/2018 1313   CREATININE 0.59 (L) 06/03/2013 2039      Component Value Date/Time   CALCIUM 8.8 (L) 11/17/2018 1313   CALCIUM 8.8 06/03/2013 2039   ALKPHOS 118 11/17/2018 1313   ALKPHOS 134 (H) 06/03/2013 2039   AST 26 11/17/2018 1313   AST 38 (H) 06/03/2013 2039   ALT 24 11/17/2018 1313   ALT 44 06/03/2013 2039   BILITOT 0.7 11/17/2018 1313   BILITOT 0.4 06/03/2013 2039       RADIOGRAPHIC STUDIES: I have personally reviewed the radiological images as listed and agreed with the findings in the report. No results found.   ASSESSMENT & PLAN:  Iron deficiency anemia due to chronic blood loss Iron deficiency anemia-question etiology. Today 11.9 normocytic.  Iron studies pending.  Continue by mouth iron PO BID.  No IV iron today; will plan IV iron if iron deficiency noted.  # History of endometrial cancer status post surgery-no adjuvant therapy; recent CT -NEG; contonue follow up with Gyn-Onc.  Stable   DISPOSITION: labs print out # NO infusion  #  follow-up in 6 months with MD- iron studies-Ferritin/CBC BMP; possible ferrahem IV- Dr.B   Orders Placed This Encounter  Procedures  . CBC with Differential    Standing Status:   Future    Standing Expiration Date:   11/17/2019  . Basic metabolic panel    Standing Status:   Future    Standing Expiration Date:   11/17/2019  . Ferritin    Standing Status:   Future    Standing Expiration Date:   11/17/2019  . Iron and TIBC    Standing Status:   Future    Standing Expiration Date:  11/17/2019   All questions were answered. The patient knows to call the clinic with any problems, questions or  concerns.      Cammie Sickle, MD 11/17/2018 2:42 PM

## 2018-11-17 NOTE — Assessment & Plan Note (Addendum)
Iron deficiency anemia-question etiology. Today 11.9 normocytic.  Iron studies pending.  Continue by mouth iron PO BID.  No IV iron today; will plan IV iron if iron deficiency noted.  # History of endometrial cancer status post surgery-no adjuvant therapy; recent CT -NEG; contonue follow up with Gyn-Onc.  Stable   DISPOSITION: labs print out # NO infusion  #  follow-up in 6 months with MD- iron studies-Ferritin/CBC BMP; possible ferrahem IV- Dr.B

## 2019-01-25 DIAGNOSIS — R808 Other proteinuria: Secondary | ICD-10-CM | POA: Insufficient documentation

## 2019-05-06 ENCOUNTER — Telehealth: Payer: Self-pay | Admitting: *Deleted

## 2019-05-06 NOTE — Telephone Encounter (Signed)
Pt called and asked about when her appt is and it 3/17. She will not be able to come on that day and needs to be r/s. She would like a call and to mail it

## 2019-05-06 NOTE — Telephone Encounter (Signed)
Colette, please r/s per patient's request

## 2019-05-18 ENCOUNTER — Ambulatory Visit: Payer: Medicare Other

## 2019-05-18 ENCOUNTER — Ambulatory Visit: Payer: Medicare Other | Admitting: Internal Medicine

## 2019-05-18 ENCOUNTER — Other Ambulatory Visit: Payer: Medicare Other

## 2019-05-21 DIAGNOSIS — K760 Fatty (change of) liver, not elsewhere classified: Secondary | ICD-10-CM | POA: Insufficient documentation

## 2019-06-06 ENCOUNTER — Ambulatory Visit: Payer: Medicare Other

## 2019-06-06 ENCOUNTER — Ambulatory Visit: Payer: Medicare Other | Attending: Internal Medicine

## 2019-06-06 DIAGNOSIS — Z23 Encounter for immunization: Secondary | ICD-10-CM

## 2019-06-06 NOTE — Progress Notes (Signed)
   Covid-19 Vaccination Clinic  Name:  Christine Lawson    MRN: [id number] DOB: 12/08/1960  06/06/2019  Christine Lawson was observed post Covid-19 immunization for 15 minutes without incident. She was provided with Vaccine Information Sheet and instruction to access the V-Safe system.   Christine Lawson was instructed to call 911 with any severe reactions post vaccine: Marland Kitchen Difficulty breathing  . Swelling of face and throat  . A fast heartbeat  . A bad rash all over body  . Dizziness and weakness   Immunizations Administered    Name Date Dose VIS Date Route   Pfizer COVID-19 Vaccine 06/06/2019  1:40 PM 0.3 mL 02/11/2019 Intramuscular   Manufacturer: [place]   Lot: [id number]   [place]: [id number]

## 2019-06-24 ENCOUNTER — Telehealth: Payer: Self-pay | Admitting: *Deleted

## 2019-06-24 NOTE — Telephone Encounter (Signed)
Patient called to report that she has a conflict with her appointment on 06/2819. Would you please call her to reschedule.

## 2019-06-29 ENCOUNTER — Inpatient Hospital Stay: Payer: Medicare Other

## 2019-06-29 ENCOUNTER — Ambulatory Visit: Payer: Medicare Other | Attending: Internal Medicine

## 2019-06-29 ENCOUNTER — Inpatient Hospital Stay: Payer: Medicare Other | Admitting: Internal Medicine

## 2019-06-29 DIAGNOSIS — Z23 Encounter for immunization: Secondary | ICD-10-CM

## 2019-06-29 NOTE — Progress Notes (Signed)
   Covid-19 Vaccination Clinic  Name:  Christine Lawson    MRN: [id number] DOB: 25-Jul-1960  06/29/2019  [name] was observed post Covid-19 immunization for 15 minutes without incident. She was provided with Vaccine Information Sheet and instruction to access the V-Safe system.   [name] was instructed to call 911 with any severe reactions post vaccine: Marland Kitchen Difficulty breathing  . Swelling of face and throat  . A fast heartbeat  . A bad rash all over body  . Dizziness and weakness   Immunizations Administered    Name Date Dose VIS Date Route   Pfizer COVID-19 Vaccine 06/29/2019  1:09 PM 0.3 mL 04/27/2018 Intramuscular   Manufacturer: [place]   Lot: [id number]   [place]: [id number]

## 2019-07-05 ENCOUNTER — Other Ambulatory Visit: Payer: Self-pay

## 2019-07-05 ENCOUNTER — Ambulatory Visit: Payer: Medicare Other

## 2019-07-05 ENCOUNTER — Encounter: Payer: Self-pay | Admitting: Internal Medicine

## 2019-07-05 ENCOUNTER — Inpatient Hospital Stay: Payer: Medicare Other

## 2019-07-05 ENCOUNTER — Inpatient Hospital Stay (HOSPITAL_BASED_OUTPATIENT_CLINIC_OR_DEPARTMENT_OTHER): Payer: Medicare Other | Admitting: Internal Medicine

## 2019-07-05 ENCOUNTER — Inpatient Hospital Stay: Payer: Medicare Other | Attending: Internal Medicine

## 2019-07-05 DIAGNOSIS — C541 Malignant neoplasm of endometrium: Secondary | ICD-10-CM | POA: Diagnosis present

## 2019-07-05 DIAGNOSIS — K648 Other hemorrhoids: Secondary | ICD-10-CM | POA: Diagnosis not present

## 2019-07-05 DIAGNOSIS — D500 Iron deficiency anemia secondary to blood loss (chronic): Secondary | ICD-10-CM

## 2019-07-05 DIAGNOSIS — E669 Obesity, unspecified: Secondary | ICD-10-CM | POA: Insufficient documentation

## 2019-07-05 DIAGNOSIS — D509 Iron deficiency anemia, unspecified: Secondary | ICD-10-CM | POA: Insufficient documentation

## 2019-07-05 DIAGNOSIS — R0602 Shortness of breath: Secondary | ICD-10-CM | POA: Insufficient documentation

## 2019-07-05 DIAGNOSIS — Z833 Family history of diabetes mellitus: Secondary | ICD-10-CM | POA: Insufficient documentation

## 2019-07-05 DIAGNOSIS — Z79899 Other long term (current) drug therapy: Secondary | ICD-10-CM | POA: Insufficient documentation

## 2019-07-05 LAB — BASIC METABOLIC PANEL
Anion gap: 10 (ref 5–15)
BUN: 9 mg/dL (ref 6–20)
CO2: 30 mmol/L (ref 22–32)
Calcium: 9 mg/dL (ref 8.9–10.3)
Chloride: 100 mmol/L (ref 98–111)
Creatinine, Ser: 0.62 mg/dL (ref 0.44–1.00)
GFR calc Af Amer: >60 mL/min (ref >60)
GFR calc non Af Amer: >60 mL/min (ref >60)
Glucose, Bld: 172 mg/dL — ABNORMAL HIGH (ref 70–99)
Potassium: 4 mmol/L (ref 3.5–5.1)
Sodium: 140 mmol/L (ref 135–145)

## 2019-07-05 LAB — FERRITIN: Ferritin: 310 ng/mL — ABNORMAL HIGH (ref 11–307)

## 2019-07-05 LAB — CBC WITH DIFFERENTIAL/PLATELET
Abs Immature Granulocytes: 0.04 10*3/uL (ref 0.00–0.07)
Basophils Absolute: 0 10*3/uL (ref 0.0–0.1)
Basophils Relative: 0 %
Eosinophils Absolute: 0.3 10*3/uL (ref 0.0–0.5)
Eosinophils Relative: 3 %
HCT: 42.3 % (ref 36.0–46.0)
Hemoglobin: 13.1 g/dL (ref 12.0–15.0)
Immature Granulocytes: 0 %
Lymphocytes Relative: 20 %
Lymphs Abs: 2 10*3/uL (ref 0.7–4.0)
MCH: 25.7 pg — ABNORMAL LOW (ref 26.0–34.0)
MCHC: 31 g/dL (ref 30.0–36.0)
MCV: 82.9 fL (ref 80.0–100.0)
Monocytes Absolute: 0.4 10*3/uL (ref 0.1–1.0)
Monocytes Relative: 4 %
Neutro Abs: 7.3 10*3/uL (ref 1.7–7.7)
Neutrophils Relative %: 73 %
Platelets: 433 10*3/uL — ABNORMAL HIGH (ref 150–400)
RBC: 5.1 MIL/uL (ref 3.87–5.11)
RDW: 14.1 % (ref 11.5–15.5)
WBC: 10.2 10*3/uL (ref 4.0–10.5)
nRBC: 0 % (ref 0.0–0.2)

## 2019-07-05 LAB — IRON AND TIBC
Iron: 70 ug/dL (ref 28–170)
Saturation Ratios: 23 % (ref 10.4–31.8)
TIBC: 300 ug/dL (ref 250–450)
UIBC: 230 ug/dL

## 2019-07-05 NOTE — Assessment & Plan Note (Addendum)
Iron deficiency anemia-question etiology. Today 13.2   Iron studies pending.  Continue by mouth iron PO BID.  No IV iron today.   # History of endometrial cancer status post surgery-no adjuvant therapy; recent CT -NEG; contonue follow up with Gyn, Dr.Schermerhorn.  stable   DISPOSITION: labs print out # NO infusion  #  follow-up in 6 months with MD- iron studies-Ferritin/CBC BMP; possible ferrahem IV- Dr.B

## 2019-07-05 NOTE — Progress Notes (Signed)
Pt and sister in for follow up today.  Denies any difficulties or concerns.

## 2019-07-05 NOTE — Progress Notes (Signed)
[place] OFFICE PROGRESS NOTE  Patient Care Team: Tracie Harrier, MD as PCP - General (Internal Medicine) Clent Jacks, RN as Registered Nurse  Cancer Staging No matching staging information was found for the patient.   Oncology History  Endometrial adenocarcinoma ([place])  07/13/2015 Initial Diagnosis   Endometrial adenocarcinoma ([place])    Anemia of iron deficiency unresponsive to oral iron therapy - got IV Venofer 1 g in Feb 2015, then on oral Ferrous sulfate 1 tablet daily. (labs on 03/28/13 had showed hemoglobin 10.0, MCV 71.3, WBC 10,800, ANC 7990, ALC 1640, platelet count 564, ferritin 27, serum iron 33, iron saturation low at 7%, TIBC 452. In June 2014 - hemoglobin 9.8, WBC 10,700, platelets 551, LFT unremarkable except alkaline phos of 120, creatinine 0.6). Stool Hemoccult was negative x2 in November 2014.  Colonoscopy July 2013 had shown internal hemorrhoids.  INTERVAL HISTORY:  Christine Lawson 59 y.o.  female pleasant patient above history of iron deficiency anemia; also history of early stage endometrial cancer is here for follow-up.  Patient denies any blood in stools or black or stools.  Denies any shortness of breath or cough.  She continues been iron pills twice a day.  Review of Systems  Constitutional: Negative for chills, diaphoresis, fever, malaise/fatigue and weight loss.  HENT: Negative for nosebleeds and sore throat.   Eyes: Negative for double vision.  Respiratory: Positive for shortness of breath. Negative for cough, hemoptysis, sputum production and wheezing.   Cardiovascular: Negative for chest pain, palpitations, orthopnea and leg swelling.  Gastrointestinal: Negative for abdominal pain, blood in stool, constipation, diarrhea, heartburn, melena, nausea and vomiting.  Genitourinary: Negative for dysuria, frequency and urgency.  Musculoskeletal: Negative for back pain and joint pain.  Skin: Negative.  Negative for itching and rash.   Neurological: Negative for dizziness, tingling, focal weakness, weakness and headaches.  Endo/Heme/Allergies: Does not bruise/bleed easily.  Psychiatric/Behavioral: Negative for depression. The patient is not nervous/anxious and does not have insomnia.       PAST MEDICAL HISTORY :  Past Medical History:  Diagnosis Date  . Diabetes mellitus without complication ([place])   . Endometrial adenocarcinoma ([place])   . GERD (gastroesophageal reflux disease)   . Hematochezia   . Hyperlipidemia   . Hypertension   . IDA (iron deficiency anemia)   . Inguinal lymphadenopathy 07/13/2015  . Irritable bowel syndrome   . Obesity   . Osteoarthritis   . Sleep apnea     PAST SURGICAL HISTORY :   Past Surgical History:  Procedure Laterality Date  . ABDOMINAL HYSTERECTOMY  feb 2013  . COLONOSCOPY WITH PROPOFOL N/A 08/02/2015   Procedure: COLONOSCOPY WITH PROPOFOL;  Surgeon: Manya Silvas, MD;  [facility] ENDOSCOPY;  Service: Endoscopy;  Laterality: N/A;  . ESOPHAGOGASTRODUODENOSCOPY (EGD) WITH PROPOFOL N/A 08/02/2015   Procedure: ESOPHAGOGASTRODUODENOSCOPY (EGD) WITH PROPOFOL;  Surgeon: Manya Silvas, MD;  Location: [facility] ENDOSCOPY;  Service: Endoscopy;  Laterality: N/A;    FAMILY HISTORY :   Family History  Problem Relation Age of Onset  . Diabetes Mother   . Diabetes Father   . Breast cancer Neg Hx     SOCIAL HISTORY:   Social History   Tobacco Use  . Smoking status: Never Smoker  . Smokeless tobacco: Never Used  Substance Use Topics  . Alcohol use: No  . Drug use: No    ALLERGIES:  is allergic to solifenacin; contrast media [iodinated diagnostic agents]; dicyclomine; and isovue [iopamidol].  MEDICATIONS:  Current Outpatient Medications  Medication Sig Dispense Refill  . albuterol (PROVENTIL HFA;VENTOLIN HFA) 108 (90 Base) MCG/ACT inhaler Inhale 1 puff into the lungs every 6 (six) hours as needed for wheezing or shortness of breath.    . allopurinol (ZYLOPRIM) 100 MG tablet  Take 100 mg by mouth daily.     Marland Kitchen amLODipine (NORVASC) 10 MG tablet Take 10 mg by mouth daily.    Marland Kitchen ascorbic acid (VITAMIN C) 500 MG tablet Take by mouth.    Marland Kitchen aspirin (ASPIRIN EC) 81 MG EC tablet Take 81 mg by mouth daily. Swallow whole.    . budesonide-formoterol (SYMBICORT) 80-4.5 MCG/ACT inhaler INHALE 2 INHALATIONS INTO THE LUNGS 2 (TWO) TIMES DAILY    . cholecalciferol (VITAMIN D) 1000 UNITS tablet Take 1,000 Units by mouth daily.    . diclofenac sodium (VOLTAREN) 1 % GEL Apply 1 application topically 2 (two) times daily as needed for pain.    Marland Kitchen dicyclomine (BENTYL) 10 MG capsule Take 1 tablet by mouth every morning. You may take another tablet every 6 hours as needed for additional pain or diarrhea.    . enalapril (VASOTEC) 2.5 MG tablet Take 2.5 mg by mouth daily.     . ferrous sulfate 325 (65 FE) MG tablet Take 325 mg by mouth 2 (two) times daily with a meal.     . HUMALOG KWIKPEN 100 UNIT/ML KwikPen     . Insulin Glargine (LANTUS SOLOSTAR) 100 UNIT/ML Solostar Pen Inject 70 Units as directed at bedtime. 03/01/18 60 units at bedtime    . Lifitegrast (XIIDRA) 5 % SOLN Apply 1 drop to eye as needed for dry eyes.    . montelukast (SINGULAIR) 10 MG tablet Take 10 mg by mouth daily.    Marland Kitchen omeprazole (PRILOSEC) 40 MG capsule Take 40 mg by mouth daily.     . pregabalin (LYRICA) 25 MG capsule Take by mouth.    . rosuvastatin (CRESTOR) 5 MG tablet Take by mouth.    . vitamin B-12 (CYANOCOBALAMIN) 500 MCG tablet Take 1 tablet by mouth daily.    Marland Kitchen gabapentin (NEURONTIN) 300 MG capsule Take 300 mg by mouth at bedtime.     No current facility-administered medications for this visit.    PHYSICAL EXAMINATION: ECOG PERFORMANCE STATUS: 0 - Asymptomatic  BP (!) 120/50 (BP Location: Left Arm, Patient Position: Sitting)   Pulse 75   Temp (!) 96.9 F (36.1 C) (Tympanic)   Resp 18   Wt 264 lb (119.7 kg)   BMI 45.29 kg/m   Filed Weights   07/05/19 1322  Weight: 264 lb (119.7 kg)    Physical  Exam  Constitutional: She is oriented to person, place, and time and well-developed, well-nourished, and in no distress.  Alone.  Walking with a cane.  Obese.  HENT:  Head: Normocephalic and atraumatic.  Mouth/Throat: Oropharynx is clear and moist. No oropharyngeal exudate.  Eyes: Pupils are equal, round, and reactive to light.  Cardiovascular: Normal rate and regular rhythm.  Pulmonary/Chest: No respiratory distress. She has no wheezes.  Abdominal: Soft. Bowel sounds are normal. She exhibits no distension and no mass. There is no abdominal tenderness. There is no rebound and no guarding.  Musculoskeletal:        General: No tenderness or edema. Normal range of motion.     Cervical back: Normal range of motion and neck supple.  Neurological: She is alert and oriented to person, place, and time.  Skin: Skin is warm.  Psychiatric: Affect normal.    LABORATORY DATA:  I have reviewed the data as listed    Component Value Date/Time   NA 140 07/05/2019 1239   NA 141 06/03/2013 2039   K 4.0 07/05/2019 1239   K 3.7 06/03/2013 2039   CL 100 07/05/2019 1239   CL 106 06/03/2013 2039   CO2 30 07/05/2019 1239   CO2 30 06/03/2013 2039   GLUCOSE 172 (H) 07/05/2019 1239   GLUCOSE 59 (L) 06/03/2013 2039   BUN 9 07/05/2019 1239   BUN 6 (L) 06/03/2013 2039   CREATININE 0.62 07/05/2019 1239   CREATININE 0.59 (L) 06/03/2013 2039   CALCIUM 9.0 07/05/2019 1239   CALCIUM 8.8 06/03/2013 2039   PROT 7.9 11/17/2018 1313   PROT 8.8 (H) 06/03/2013 2039   ALBUMIN 3.6 11/17/2018 1313   ALBUMIN 3.6 06/03/2013 2039   AST 26 11/17/2018 1313   AST 38 (H) 06/03/2013 2039   ALT 24 11/17/2018 1313   ALT 44 06/03/2013 2039   ALKPHOS 118 11/17/2018 1313   ALKPHOS 134 (H) 06/03/2013 2039   BILITOT 0.7 11/17/2018 1313   BILITOT 0.4 06/03/2013 2039   GFRNONAA >60 07/05/2019 1239   GFRNONAA >60 06/03/2013 2039   GFRAA >60 07/05/2019 1239   GFRAA >60 06/03/2013 2039    No results found for: SPEP,  UPEP  Lab Results  Component Value Date   WBC 10.2 07/05/2019   NEUTROABS 7.3 07/05/2019   HGB 13.1 07/05/2019   HCT 42.3 07/05/2019   MCV 82.9 07/05/2019   PLT 433 (H) 07/05/2019      Chemistry      Component Value Date/Time   NA 140 07/05/2019 1239   NA 141 06/03/2013 2039   K 4.0 07/05/2019 1239   K 3.7 06/03/2013 2039   CL 100 07/05/2019 1239   CL 106 06/03/2013 2039   CO2 30 07/05/2019 1239   CO2 30 06/03/2013 2039   BUN 9 07/05/2019 1239   BUN 6 (L) 06/03/2013 2039   CREATININE 0.62 07/05/2019 1239   CREATININE 0.59 (L) 06/03/2013 2039      Component Value Date/Time   CALCIUM 9.0 07/05/2019 1239   CALCIUM 8.8 06/03/2013 2039   ALKPHOS 118 11/17/2018 1313   ALKPHOS 134 (H) 06/03/2013 2039   AST 26 11/17/2018 1313   AST 38 (H) 06/03/2013 2039   ALT 24 11/17/2018 1313   ALT 44 06/03/2013 2039   BILITOT 0.7 11/17/2018 1313   BILITOT 0.4 06/03/2013 2039       RADIOGRAPHIC STUDIES: I have personally reviewed the radiological images as listed and agreed with the findings in the report. No results found.   ASSESSMENT & PLAN:  Iron deficiency anemia due to chronic blood loss Iron deficiency anemia-question etiology. Today 13.2   Iron studies pending.  Continue by mouth iron PO BID.  No IV iron today.   # History of endometrial cancer status post surgery-no adjuvant therapy; recent CT -NEG; contonue follow up with Gyn, Dr.Schermerhorn.  stable   DISPOSITION: labs print out # NO infusion  #  follow-up in 6 months with MD- iron studies-Ferritin/CBC BMP; possible ferrahem IV- Dr.B   No orders of the defined types were placed in this encounter.  All questions were answered. The patient knows to call the clinic with any problems, questions or concerns.      Cammie Sickle, MD 07/05/2019 1:51 PM

## 2019-09-12 ENCOUNTER — Other Ambulatory Visit: Payer: Self-pay | Admitting: Obstetrics and Gynecology

## 2019-09-12 DIAGNOSIS — Z1231 Encounter for screening mammogram for malignant neoplasm of breast: Secondary | ICD-10-CM

## 2019-10-31 ENCOUNTER — Ambulatory Visit
Admission: RE | Admit: 2019-10-31 | Discharge: 2019-10-31 | Disposition: A | Discharge status: Home / Self Care | Payer: Medicare Other | Source: Ambulatory Visit | Attending: Obstetrics and Gynecology | Admitting: Obstetrics and Gynecology

## 2019-10-31 ENCOUNTER — Other Ambulatory Visit: Payer: Self-pay

## 2019-10-31 DIAGNOSIS — Z1231 Encounter for screening mammogram for malignant neoplasm of breast: Secondary | ICD-10-CM

## 2019-11-10 ENCOUNTER — Other Ambulatory Visit: Payer: Self-pay | Admitting: Obstetrics and Gynecology

## 2019-11-10 DIAGNOSIS — N6310 Unspecified lump in the right breast, unspecified quadrant: Secondary | ICD-10-CM

## 2019-11-10 DIAGNOSIS — R928 Other abnormal and inconclusive findings on diagnostic imaging of breast: Secondary | ICD-10-CM

## 2019-11-17 ENCOUNTER — Other Ambulatory Visit: Payer: Self-pay

## 2019-11-17 ENCOUNTER — Ambulatory Visit
Admission: RE | Admit: 2019-11-17 | Discharge: 2019-11-17 | Disposition: A | Discharge status: Home / Self Care | Payer: Medicare Other | Source: Ambulatory Visit | Attending: Obstetrics and Gynecology | Admitting: Obstetrics and Gynecology

## 2019-11-17 DIAGNOSIS — N6310 Unspecified lump in the right breast, unspecified quadrant: Secondary | ICD-10-CM

## 2019-11-17 DIAGNOSIS — R928 Other abnormal and inconclusive findings on diagnostic imaging of breast: Secondary | ICD-10-CM | POA: Diagnosis present

## 2020-01-03 ENCOUNTER — Ambulatory Visit: Payer: Medicare Other

## 2020-01-03 ENCOUNTER — Other Ambulatory Visit: Payer: Medicare Other

## 2020-01-03 ENCOUNTER — Ambulatory Visit: Payer: Medicare Other | Admitting: Internal Medicine

## 2020-01-09 ENCOUNTER — Inpatient Hospital Stay: Payer: Medicare Other

## 2020-01-09 ENCOUNTER — Other Ambulatory Visit: Payer: Self-pay

## 2020-01-09 ENCOUNTER — Inpatient Hospital Stay: Payer: Medicare Other | Attending: Internal Medicine | Admitting: Internal Medicine

## 2020-01-09 ENCOUNTER — Encounter: Payer: Self-pay | Admitting: Internal Medicine

## 2020-01-09 DIAGNOSIS — M1990 Unspecified osteoarthritis, unspecified site: Secondary | ICD-10-CM | POA: Insufficient documentation

## 2020-01-09 DIAGNOSIS — Z79899 Other long term (current) drug therapy: Secondary | ICD-10-CM | POA: Insufficient documentation

## 2020-01-09 DIAGNOSIS — E669 Obesity, unspecified: Secondary | ICD-10-CM | POA: Insufficient documentation

## 2020-01-09 DIAGNOSIS — D500 Iron deficiency anemia secondary to blood loss (chronic): Secondary | ICD-10-CM | POA: Insufficient documentation

## 2020-01-09 DIAGNOSIS — K648 Other hemorrhoids: Secondary | ICD-10-CM | POA: Diagnosis not present

## 2020-01-09 DIAGNOSIS — Z6841 Body Mass Index (BMI) 40.0 and over, adult: Secondary | ICD-10-CM | POA: Diagnosis not present

## 2020-01-09 DIAGNOSIS — C541 Malignant neoplasm of endometrium: Secondary | ICD-10-CM | POA: Insufficient documentation

## 2020-01-09 DIAGNOSIS — E119 Type 2 diabetes mellitus without complications: Secondary | ICD-10-CM | POA: Diagnosis not present

## 2020-01-09 DIAGNOSIS — Z833 Family history of diabetes mellitus: Secondary | ICD-10-CM | POA: Diagnosis not present

## 2020-01-09 LAB — FERRITIN: Ferritin: 248 ng/mL (ref 11–307)

## 2020-01-09 LAB — CBC WITH DIFFERENTIAL/PLATELET
Abs Immature Granulocytes: 0.04 10*3/uL (ref 0.00–0.07)
Basophils Absolute: 0.1 10*3/uL (ref 0.0–0.1)
Basophils Relative: 1 %
Eosinophils Absolute: 0.3 10*3/uL (ref 0.0–0.5)
Eosinophils Relative: 3 %
HCT: 40 % (ref 36.0–46.0)
Hemoglobin: 12.5 g/dL (ref 12.0–15.0)
Immature Granulocytes: 0 %
Lymphocytes Relative: 18 %
Lymphs Abs: 1.9 10*3/uL (ref 0.7–4.0)
MCH: 25.7 pg — ABNORMAL LOW (ref 26.0–34.0)
MCHC: 31.3 g/dL (ref 30.0–36.0)
MCV: 82.3 fL (ref 80.0–100.0)
Monocytes Absolute: 0.5 10*3/uL (ref 0.1–1.0)
Monocytes Relative: 4 %
Neutro Abs: 7.6 10*3/uL (ref 1.7–7.7)
Neutrophils Relative %: 74 %
Platelets: 423 10*3/uL — ABNORMAL HIGH (ref 150–400)
RBC: 4.86 MIL/uL (ref 3.87–5.11)
RDW: 13.8 % (ref 11.5–15.5)
WBC: 10.4 10*3/uL (ref 4.0–10.5)
nRBC: 0 % (ref 0.0–0.2)

## 2020-01-09 LAB — BASIC METABOLIC PANEL
Anion gap: 10 (ref 5–15)
BUN: 12 mg/dL (ref 6–20)
CO2: 29 mmol/L (ref 22–32)
Calcium: 8.8 mg/dL — ABNORMAL LOW (ref 8.9–10.3)
Chloride: 102 mmol/L (ref 98–111)
Creatinine, Ser: 0.62 mg/dL (ref 0.44–1.00)
GFR, Estimated: >60 mL/min (ref >60)
Glucose, Bld: 159 mg/dL — ABNORMAL HIGH (ref 70–99)
Potassium: 3.2 mmol/L — ABNORMAL LOW (ref 3.5–5.1)
Sodium: 141 mmol/L (ref 135–145)

## 2020-01-09 LAB — IRON AND TIBC
Iron: 59 ug/dL (ref 28–170)
Saturation Ratios: 17 % (ref 10.4–31.8)
TIBC: 339 ug/dL (ref 250–450)
UIBC: 280 ug/dL

## 2020-01-09 NOTE — Addendum Note (Signed)
Addended by: Delice Bison E on: 01/09/2020 03:38 PM   Modules accepted: Orders

## 2020-01-09 NOTE — Assessment & Plan Note (Signed)
Iron deficiency anemia-question etiology. Today 12.5.  Iron studies pending.  Continue by mouth iron PO BID.  No IV iron today.   # History of endometrial cancer status post surgery-no adjuvant therapy; recent CT -NEG; contonue follow up with Gyn, Dr.Schermerhorn. STABLE.    DISPOSITION: labs print out # NO infusion  #  follow-up in 6 months with MD- iron studies-Ferritin/CBC BMP; possible ferrahem IV- Dr.B

## 2020-01-09 NOTE — Progress Notes (Signed)
[place] OFFICE PROGRESS NOTE  Patient Care Team: Tracie Harrier, MD as PCP - General (Internal Medicine) Clent Jacks, RN as Registered Nurse  Cancer Staging No matching staging information was found for the patient.   Oncology History  Endometrial adenocarcinoma ([place])  07/13/2015 Initial Diagnosis   Endometrial adenocarcinoma ([place])    Anemia of iron deficiency unresponsive to oral iron therapy - got IV Venofer 1 g in Feb 2015, then on oral Ferrous sulfate 1 tablet daily. (labs on 03/28/13 had showed hemoglobin 10.0, MCV 71.3, WBC 10,800, ANC 7990, ALC 1640, platelet count 564, ferritin 27, serum iron 33, iron saturation low at 7%, TIBC 452. In June 2014 - hemoglobin 9.8, WBC 10,700, platelets 551, LFT unremarkable except alkaline phos of 120, creatinine 0.6). Stool Hemoccult was negative x2 in November 2014.  Colonoscopy July 2013 had shown internal hemorrhoids.  INTERVAL HISTORY:  Christine Lawson 59 y.o.  female pleasant patient above history of iron deficiency anemia; also history of early stage endometrial cancer is here for follow-up.  Denies any blood in stool or black or stool.  Denies any shortness of breath or cough.  She continues to be on iron pills twice a day.  Review of Systems  Constitutional: Negative for chills, diaphoresis, fever, malaise/fatigue and weight loss.  HENT: Negative for nosebleeds and sore throat.   Eyes: Negative for double vision.  Respiratory: Negative for cough, hemoptysis, sputum production and wheezing.   Cardiovascular: Negative for chest pain, palpitations, orthopnea and leg swelling.  Gastrointestinal: Negative for abdominal pain, blood in stool, constipation, diarrhea, heartburn, melena, nausea and vomiting.  Genitourinary: Negative for dysuria, frequency and urgency.  Musculoskeletal: Negative for back pain and joint pain.  Skin: Negative.  Negative for itching and rash.  Neurological: Negative for dizziness,  tingling, focal weakness, weakness and headaches.  Endo/Heme/Allergies: Does not bruise/bleed easily.  Psychiatric/Behavioral: Negative for depression. The patient is not nervous/anxious and does not have insomnia.       PAST MEDICAL HISTORY :  Past Medical History:  Diagnosis Date  . Diabetes mellitus without complication ([place])   . Endometrial adenocarcinoma ([place])   . GERD (gastroesophageal reflux disease)   . Hematochezia   . Hyperlipidemia   . Hypertension   . IDA (iron deficiency anemia)   . Inguinal lymphadenopathy 07/13/2015  . Irritable bowel syndrome   . Obesity   . Osteoarthritis   . Sleep apnea     PAST SURGICAL HISTORY :   Past Surgical History:  Procedure Laterality Date  . ABDOMINAL HYSTERECTOMY  feb 2013  . COLONOSCOPY WITH PROPOFOL N/A 08/02/2015   Procedure: COLONOSCOPY WITH PROPOFOL;  Surgeon: Manya Silvas, MD;  Location: [facility] ENDOSCOPY;  Service: Endoscopy;  Laterality: N/A;  . ESOPHAGOGASTRODUODENOSCOPY (EGD) WITH PROPOFOL N/A 08/02/2015   Procedure: ESOPHAGOGASTRODUODENOSCOPY (EGD) WITH PROPOFOL;  Surgeon: Manya Silvas, MD;  Location: [facility] ENDOSCOPY;  Service: Endoscopy;  Laterality: N/A;    FAMILY HISTORY :   Family History  Problem Relation Age of Onset  . Diabetes Mother   . Diabetes Father   . Breast cancer Neg Hx     SOCIAL HISTORY:   Social History   Tobacco Use  . Smoking status: Never Smoker  . Smokeless tobacco: Never Used  Substance Use Topics  . Alcohol use: No  . Drug use: No    ALLERGIES:  is allergic to solifenacin, contrast media [iodinated diagnostic agents], dicyclomine, and isovue [iopamidol].  MEDICATIONS:  Current Outpatient Medications  Medication Sig Dispense Refill  .  albuterol (PROVENTIL HFA;VENTOLIN HFA) 108 (90 Base) MCG/ACT inhaler Inhale 1 puff into the lungs every 6 (six) hours as needed for wheezing or shortness of breath.    . allopurinol (ZYLOPRIM) 100 MG tablet Take 100 mg by mouth daily.     Marland Kitchen  amLODipine (NORVASC) 10 MG tablet Take 10 mg by mouth daily.    Marland Kitchen ascorbic acid (VITAMIN C) 500 MG tablet Take by mouth.    Marland Kitchen aspirin (ASPIRIN EC) 81 MG EC tablet Take 81 mg by mouth daily. Swallow whole.    . budesonide-formoterol (SYMBICORT) 80-4.5 MCG/ACT inhaler INHALE 2 INHALATIONS INTO THE LUNGS 2 (TWO) TIMES DAILY    . cholecalciferol (VITAMIN D) 1000 UNITS tablet Take 1,000 Units by mouth daily.    . diclofenac sodium (VOLTAREN) 1 % GEL Apply 1 application topically 2 (two) times daily as needed for pain.    Marland Kitchen dicyclomine (BENTYL) 10 MG capsule Take 1 tablet by mouth every morning. You may take another tablet every 6 hours as needed for additional pain or diarrhea.    . enalapril (VASOTEC) 2.5 MG tablet Take 2.5 mg by mouth daily.     . ferrous sulfate 325 (65 FE) MG tablet Take 325 mg by mouth 2 (two) times daily with a meal.     . HUMALOG KWIKPEN 100 UNIT/ML KwikPen     . Insulin Glargine (LANTUS SOLOSTAR) 100 UNIT/ML Solostar Pen Inject 70 Units as directed at bedtime. 03/01/18 60 units at bedtime    . Lifitegrast (XIIDRA) 5 % SOLN Apply 1 drop to eye as needed for dry eyes.    Marland Kitchen omeprazole (PRILOSEC) 40 MG capsule Take 40 mg by mouth daily.     . pregabalin (LYRICA) 25 MG capsule Take by mouth.    . rosuvastatin (CRESTOR) 5 MG tablet Take by mouth.    . vitamin B-12 (CYANOCOBALAMIN) 500 MCG tablet Take 1 tablet by mouth daily.    . montelukast (SINGULAIR) 10 MG tablet Take 10 mg by mouth daily. (Patient not taking: Reported on 01/09/2020)     No current facility-administered medications for this visit.    PHYSICAL EXAMINATION: ECOG PERFORMANCE STATUS: 0 - Asymptomatic  BP (!) 122/44 (BP Location: Right Arm, Patient Position: Sitting, Cuff Size: Large)   Pulse 79   Temp 98.2 F (36.8 C)   Resp 18   Ht 5' 4.02" (1.626 m)   Wt 240 lb (108.9 kg)   SpO2 98%   BMI 41.17 kg/m   Filed Weights   01/09/20 1337  Weight: 240 lb (108.9 kg)    Physical Exam Constitutional:       Comments: Alone.  Walking with a cane.  Obese.  HENT:     Head: Normocephalic and atraumatic.     Mouth/Throat:     Pharynx: No oropharyngeal exudate.  Eyes:     Pupils: Pupils are equal, round, and reactive to light.  Cardiovascular:     Rate and Rhythm: Normal rate and regular rhythm.  Pulmonary:     Effort: No respiratory distress.     Breath sounds: No wheezing.  Abdominal:     General: Bowel sounds are normal. There is no distension.     Palpations: Abdomen is soft. There is no mass.     Tenderness: There is no abdominal tenderness. There is no guarding or rebound.  Musculoskeletal:        General: No tenderness. Normal range of motion.     Cervical back: Normal range of motion and neck  supple.  Skin:    General: Skin is warm.  Neurological:     Mental Status: She is alert and oriented to person, place, and time.  Psychiatric:        Mood and Affect: Affect normal.     LABORATORY DATA:  I have reviewed the data as listed    Component Value Date/Time   NA 141 01/09/2020 1323   NA 141 06/03/2013 2039   K 3.2 (L) 01/09/2020 1323   K 3.7 06/03/2013 2039   CL 102 01/09/2020 1323   CL 106 06/03/2013 2039   CO2 29 01/09/2020 1323   CO2 30 06/03/2013 2039   GLUCOSE 159 (H) 01/09/2020 1323   GLUCOSE 59 (L) 06/03/2013 2039   BUN 12 01/09/2020 1323   BUN 6 (L) 06/03/2013 2039   CREATININE 0.62 01/09/2020 1323   CREATININE 0.59 (L) 06/03/2013 2039   CALCIUM 8.8 (L) 01/09/2020 1323   CALCIUM 8.8 06/03/2013 2039   PROT 7.9 11/17/2018 1313   PROT 8.8 (H) 06/03/2013 2039   ALBUMIN 3.6 11/17/2018 1313   ALBUMIN 3.6 06/03/2013 2039   AST 26 11/17/2018 1313   AST 38 (H) 06/03/2013 2039   ALT 24 11/17/2018 1313   ALT 44 06/03/2013 2039   ALKPHOS 118 11/17/2018 1313   ALKPHOS 134 (H) 06/03/2013 2039   BILITOT 0.7 11/17/2018 1313   BILITOT 0.4 06/03/2013 2039   GFRNONAA >60 01/09/2020 1323   GFRNONAA >60 06/03/2013 2039   GFRAA >60 07/05/2019 1239   GFRAA >60 06/03/2013  2039    No results found for: SPEP, UPEP  Lab Results  Component Value Date   WBC 10.4 01/09/2020   NEUTROABS 7.6 01/09/2020   HGB 12.5 01/09/2020   HCT 40.0 01/09/2020   MCV 82.3 01/09/2020   PLT 423 (H) 01/09/2020      Chemistry      Component Value Date/Time   NA 141 01/09/2020 1323   NA 141 06/03/2013 2039   K 3.2 (L) 01/09/2020 1323   K 3.7 06/03/2013 2039   CL 102 01/09/2020 1323   CL 106 06/03/2013 2039   CO2 29 01/09/2020 1323   CO2 30 06/03/2013 2039   BUN 12 01/09/2020 1323   BUN 6 (L) 06/03/2013 2039   CREATININE 0.62 01/09/2020 1323   CREATININE 0.59 (L) 06/03/2013 2039      Component Value Date/Time   CALCIUM 8.8 (L) 01/09/2020 1323   CALCIUM 8.8 06/03/2013 2039   ALKPHOS 118 11/17/2018 1313   ALKPHOS 134 (H) 06/03/2013 2039   AST 26 11/17/2018 1313   AST 38 (H) 06/03/2013 2039   ALT 24 11/17/2018 1313   ALT 44 06/03/2013 2039   BILITOT 0.7 11/17/2018 1313   BILITOT 0.4 06/03/2013 2039       RADIOGRAPHIC STUDIES: I have personally reviewed the radiological images as listed and agreed with the findings in the report. No results found.   ASSESSMENT & PLAN:  Iron deficiency anemia due to chronic blood loss Iron deficiency anemia-question etiology. Today 12.5.  Iron studies pending.  Continue by mouth iron PO BID.  No IV iron today.   # History of endometrial cancer status post surgery-no adjuvant therapy; recent CT -NEG; contonue follow up with Gyn, Dr.Schermerhorn. STABLE.    DISPOSITION: labs print out # NO infusion  #  follow-up in 6 months with MD- iron studies-Ferritin/CBC BMP; possible ferrahem IV- Dr.B   No orders of the defined types were placed in this encounter.  All questions were  answered. The patient knows to call the clinic with any problems, questions or concerns.      Cammie Sickle, MD 01/09/2020 3:16 PM

## 2020-01-16 ENCOUNTER — Ambulatory Visit: Payer: Medicare Other | Attending: Internal Medicine

## 2020-01-16 DIAGNOSIS — Z23 Encounter for immunization: Secondary | ICD-10-CM

## 2020-01-16 NOTE — Progress Notes (Signed)
   Covid-19 Vaccination Clinic  Name:  Christine Lawson    MRN: [id number] DOB: [birth year]  01/16/2020  Christine Lawson was observed post Covid-19 immunization for 15 minutes without incident. She was provided with Vaccine Information Sheet and instruction to access the V-Safe system.   Christine Lawson was instructed to call 911 with any severe reactions post vaccine: Marland Kitchen Difficulty breathing  . Swelling of face and throat  . A fast heartbeat  . A bad rash all over body  . Dizziness and weakness   Immunizations Administered    Name Date Dose VIS Date Route   Pfizer COVID-19 Vaccine 01/16/2020  1:07 PM 0.3 mL 12/21/2019 Intramuscular   Manufacturer: [place]   Lot: [id number]   [place]: [id number]

## 2020-04-19 ENCOUNTER — Other Ambulatory Visit: Payer: Self-pay

## 2020-04-19 ENCOUNTER — Ambulatory Visit: Payer: Medicare Other | Attending: Internal Medicine | Admitting: Physical Therapy

## 2020-04-19 ENCOUNTER — Encounter: Payer: Self-pay | Admitting: Physical Therapy

## 2020-04-19 DIAGNOSIS — M25612 Stiffness of left shoulder, not elsewhere classified: Secondary | ICD-10-CM | POA: Insufficient documentation

## 2020-04-19 DIAGNOSIS — M25512 Pain in left shoulder: Secondary | ICD-10-CM | POA: Diagnosis not present

## 2020-04-19 DIAGNOSIS — G8929 Other chronic pain: Secondary | ICD-10-CM | POA: Insufficient documentation

## 2020-04-19 NOTE — Therapy (Signed)
[place] [facility] [address] [place], [address] Phone: [phone]   Fax:  [phone]  Physical Therapy Evaluation  Patient Details  Name: Christine Lawson MRN: [id number] Date of Birth: 1960-05-06 No data recorded  Encounter Date: 04/19/2020   PT End of Session - 04/19/20 1231    Visit Number 1    Number of Visits 17    Date for PT Re-Evaluation 06/14/20    Authorization - Visit Number 1    Authorization - Number of Visits 17    PT Start Time 1115    PT Stop Time 1200    PT Time Calculation (min) 45 min    Activity Tolerance Patient tolerated treatment well    Behavior During Therapy [facility] for tasks assessed/performed           Past Medical History:  Diagnosis Date  . Diabetes mellitus without complication ([place])   . Endometrial adenocarcinoma ([place])   . GERD (gastroesophageal reflux disease)   . Hematochezia   . Hyperlipidemia   . Hypertension   . IDA (iron deficiency anemia)   . Inguinal lymphadenopathy 07/13/2015  . Irritable bowel syndrome   . Obesity   . Osteoarthritis   . Sleep apnea     Past Surgical History:  Procedure Laterality Date  . ABDOMINAL HYSTERECTOMY  feb 2013  . COLONOSCOPY WITH PROPOFOL N/A 08/02/2015   Procedure: COLONOSCOPY WITH PROPOFOL;  Surgeon: Manya Silvas, MD;  Location: [facility] ENDOSCOPY;  Service: Endoscopy;  Laterality: N/A;  . ESOPHAGOGASTRODUODENOSCOPY (EGD) WITH PROPOFOL N/A 08/02/2015   Procedure: ESOPHAGOGASTRODUODENOSCOPY (EGD) WITH PROPOFOL;  Surgeon: Manya Silvas, MD;  Location: [facility] ENDOSCOPY;  Service: Endoscopy;  Laterality: N/A;    There were no vitals filed for this visit.    Subjective Assessment - 04/19/20 1113    Pertinent History Pt is a 60 y.o female with c/c of bilateral shoulder pain with more concerns for the left. Pain started a few months ago with no MOI. Pain is currently 7/10 Worst 9/10 Best 3/10. Pain is aggrevated with lifting  overhead, exercises, pulling and pushing. Pain is eased with rest and medication. Pain sometimes wakes her up at night but is able to go back to sleep with position change. Pain is better in the morning and worse as day goes on. Pt does not work currently. Pt would like to get shoulder back to PLOF. Denies N/T down the arm, B/B changes, changes in weight, or night pains.    Limitations House hold activities;Lifting    How long can you sit comfortably? 10 min    How long can you stand comfortably? unlimited    How long can you walk comfortably? unlimited    Diagnostic tests none    Patient Stated Goals return shoulder to PLOF    Currently in Pain? Yes    Pain Score 7     Pain Location Shoulder    Pain Orientation Left    Pain Descriptors / Indicators Dull    Pain Type Chronic pain    Pain Onset More than a month ago    Pain Frequency Constant    Aggravating Factors  lifting, pushing, pulling, exercises    Pain Relieving Factors rest, medicine prn    Effect of Pain on Daily Activities unable to complete heavy ADLs    Pain Onset --           Eval     Posture/Gait -  Forward head w/ rounded  shoulder in sitting and walking. Decreased stride length during gait using "hurricane" on RUE Palpation/Sensation - TTP along bilateral upper trap  FUNCTIONAL TEST -  Reaching overhead as to put something on a shelf  REPEATED MOTIONS - not tested; no concerns of centralization/peripherilization  AROM/OP  SHOULDER FLEX - R/L 135/140 ABD - R/L  95/ 112 ER - R/L 40/ 60 IR - R/L  75/ 80   CERVICAL FLEX - WFL EXT - WFL SIDEBENDING - WFL ROTATION - WFL     PROM/OP  SHOULDER FLEX - R/L 135/140 ABD - R/L  100/ 115 ER - R/L  50/ 65 IR - R/L  77/85      CERVICAL FLEX - WFL EXT - WFL SIDEBENDING - WFL ROTATION - WFL      PAIVM/PPIVM/PAM - Hypomobile shoulder joint w/ pain    MUSCLE LENGTH TEST  Not Tested   STRENGTH Shoulder Flex R/L 4/4  Abd R/L 4/4 ER R/L 5 IR R/L  5 Periscapular Y 4; testing position in standing in front of wall T 4; testing position in standing in front of wall          SPECIAL TESTS Lateral jobe - Positive Empty/Full can - Positive Neers sign - Positive  Arc sign - Positive ER resistance - Positive       Therex     PT reviewed the following HEP with patient with patient able to demonstrate a set of the following with min cuing for correction needed. PT educated patient on parameters of therex; how/when to inc/decrease intensity, frequency, rep/set range, and purpose of therex with verbalized understanding from patient   Supine AAROM of shoulder Flex, abd, ER, IR using dowel 3x 12-20 1x day 2-3 d/wk         Objective measurements completed on examination: See above findings.               PT Education - 04/19/20 1230    Education Details PT educated pt on HEP and Diagnosis/Prognosis    Person(s) Educated Patient    Methods Explanation;Demonstration;Verbal cues    Comprehension Verbalized understanding;Returned demonstration;Verbal cues required            PT Short Term Goals - 04/19/20 1241      PT SHORT TERM GOAL #1   Title Pt will be independent with HEP in order to improve shoulder ROM in order to improve function of household ADLs    Time 4    Period Weeks    Status New             PT Long Term Goals - 04/19/20 1242      PT LONG TERM GOAL #1   Title Pt will decrease worst shoulder pain on NPRS in order to demonstrate clinically significant reduction in pain    Baseline 04/19/20 9/10    Time 8    Period Weeks    Status New      PT LONG TERM GOAL #2   Title Pt will demonstrate full AROM of the left shoulder in order to demonstrate full mobility to perform household ADLs and community activity    Baseline 04/19/20 Flex 140 abd 112  ER 60 IR 80    Time 8    Period Weeks    Status New      PT LONG TERM GOAL #3   Title Pt will demonstrate ability to place a 10# DB to a shelf  overhead level to demonstrate increased strength and mobility  to perform heavy household ADLs    Baseline 04/19/20 eye level with no weight    Time 8    Period Weeks    Status New      PT LONG TERM GOAL #4   Title Pt will increase L shoulder flex/abd MMT to 5/5 to demonstrate increase in strength to perform heavy household ADLs and community activity.    Baseline 04/19/20 L shoulder Flex 4, Abd 4    Time 8    Period Weeks    Status New                  Plan - 04/19/20 1231    Clinical Impression Statement Pt is a 60 y.o. female with c/c of bilateral shoulder pain with more concerns for the left that started a few months ago with no MOI. Evaluation reveals deficits of pain, decreased shoulder strength, A/PROM with indications of shoulder impingment w/ secondary RTC tendinopathy limiting activities of lifting overhead, pushing, pulling, and exercise preventing full function of household ADLs and community activity. Pt would benefit from skilled PT to address these impairments to return to PLOF    Personal Factors and Comorbidities Fitness;Comorbidity 1;Past/Current Experience    Examination-Activity Limitations Lift;Reach Overhead;Caring for Others;Carry;Bed Mobility    Examination-Participation Restrictions Community Activity;Cleaning;Laundry    Stability/Clinical Decision Making Evolving/Moderate complexity    Clinical Decision Making Moderate    Rehab Potential Fair    PT Frequency 2x / week    PT Duration 8 weeks    PT Treatment/Interventions Neuromuscular re-education;Passive range of motion;Manual techniques;Patient/family education;Taping;Therapeutic exercise;Therapeutic activities;Functional mobility training;Moist Heat;Ultrasound;Cryotherapy;Electrical Stimulation;Dry needling;Balance training;Stair training;Gait training;ADLs/Self Care Home Management    PT Next Visit Plan HEP review, POC    PT Home Exercise Plan AAROM with dowel of shoulder flex, abd, ER, IR    Consulted  and Agree with Plan of Care Patient           Patient will benefit from skilled therapeutic intervention in order to improve the following deficits and impairments:  Abnormal gait,Pain,Improper body mechanics,Postural dysfunction,Decreased activity tolerance,Decreased endurance,Decreased range of motion,Decreased strength,Decreased balance,Difficulty walking,Impaired flexibility,Hypomobility  Visit Diagnosis: Chronic left shoulder pain  Stiffness of left shoulder, not elsewhere classified     Problem List Patient Active Problem List   Diagnosis Date Noted  . Lymphedema 01/17/2016  . Venous (peripheral) insufficiency 01/17/2016  . Pain in limb 01/17/2016  . Iron deficiency anemia due to chronic blood loss 01/09/2016  . History of endometrial cancer 08/01/2015  . Endometrial adenocarcinoma ([place]) 07/13/2015    Class: History of  . Inguinal lymphadenopathy 07/13/2015  . Long term current use of insulin ([place]) 12/12/2013  . Microalbuminuria 12/12/2013  . Diabetes ([place]) 07/28/2013  . BP (high blood pressure) 07/28/2013  . HLD (hyperlipidemia) 07/28/2013  . Adiposity 07/28/2013  . Obstructive apnea 07/28/2013  . Arthritis, degenerative 07/28/2013  . Apnea, sleep 07/28/2013  . Thyroid nodule 07/28/2013    Durwin Reges DPT Turner Daniels, SPT  Durwin Reges 04/19/2020, 4:02 PM  [place] [facility] [address], [place], [address] Phone: [phone]   Fax:  [phone]  Name: Christine Lawson MRN: [id number] Date of Birth: August 16, 1960

## 2020-04-26 ENCOUNTER — Encounter: Payer: Self-pay | Admitting: Physical Therapy

## 2020-04-26 ENCOUNTER — Other Ambulatory Visit: Payer: Self-pay

## 2020-04-26 ENCOUNTER — Ambulatory Visit: Payer: Medicare Other | Admitting: Physical Therapy

## 2020-04-26 DIAGNOSIS — M25612 Stiffness of left shoulder, not elsewhere classified: Secondary | ICD-10-CM

## 2020-04-26 DIAGNOSIS — G8929 Other chronic pain: Secondary | ICD-10-CM

## 2020-04-26 DIAGNOSIS — M25512 Pain in left shoulder: Secondary | ICD-10-CM

## 2020-04-26 NOTE — Therapy (Signed)
[place] [facility] [address] [place], [address] Phone: [phone]   Fax:  [phone]  Physical Therapy Treatment  Patient Details  Name: Christine Lawson MRN: [id number] Date of Birth: May 23, 1960 No data recorded  Encounter Date: 04/26/2020   PT End of Session - 04/26/20 1433    Visit Number 2    Number of Visits 17    Date for PT Re-Evaluation 06/14/20    Authorization - Visit Number 1    Authorization - Number of Visits 17    PT Start Time 1430    PT Stop Time 1510    PT Time Calculation (min) 40 min    Activity Tolerance Patient tolerated treatment well    Behavior During Therapy [facility] for tasks assessed/performed           Past Medical History:  Diagnosis Date  . Diabetes mellitus without complication ([place])   . Endometrial adenocarcinoma ([place])   . GERD (gastroesophageal reflux disease)   . Hematochezia   . Hyperlipidemia   . Hypertension   . IDA (iron deficiency anemia)   . Inguinal lymphadenopathy 07/13/2015  . Irritable bowel syndrome   . Obesity   . Osteoarthritis   . Sleep apnea     Past Surgical History:  Procedure Laterality Date  . ABDOMINAL HYSTERECTOMY  feb 2013  . COLONOSCOPY WITH PROPOFOL N/A 08/02/2015   Procedure: COLONOSCOPY WITH PROPOFOL;  Surgeon: Manya Silvas, MD;  Location: [facility] ENDOSCOPY;  Service: Endoscopy;  Laterality: N/A;  . ESOPHAGOGASTRODUODENOSCOPY (EGD) WITH PROPOFOL N/A 08/02/2015   Procedure: ESOPHAGOGASTRODUODENOSCOPY (EGD) WITH PROPOFOL;  Surgeon: Manya Silvas, MD;  Location: [facility] ENDOSCOPY;  Service: Endoscopy;  Laterality: N/A;    There were no vitals filed for this visit.   Subjective Assessment - 04/26/20 1427    Subjective Pt reports some pain todays, 6/10 currently. Reports compliance with HEP    Pertinent History Pt is a 60 y.o female with c/c of bilateral shoulder pain with more concerns for the left. Pain started a few months ago with no MOI.  Pain is currently 7/10 Worst 9/10 Best 3/10. Pain is aggrevated with lifting overhead, exercises, pulling and pushing. Pain is eased with rest and medication. Pain sometimes wakes her up at night but is able to go back to sleep with position change. Pain is better in the morning and worse as day goes on. Pt does not work currently. Pt would like to get shoulder back to PLOF. Denies N/T down the arm, B/B changes, changes in weight, or night pains.    Limitations House hold activities;Lifting    How long can you sit comfortably? 10 min    How long can you stand comfortably? unlimited    How long can you walk comfortably? unlimited    Diagnostic tests none    Patient Stated Goals return shoulder to PLOF    Pain Onset More than a month ago               Ther-Ex  HEP Review  Pulleys flex / abd for 10 min with cuing for scapulohumeral rhythm and cues to keep cervical in neutral and prevent shoulder hike   Supine AAROM IR <> ER 2x15 2-3sec hold with dowel; minimal ER; min cuing to keep arm flat along table with good carry over   Supine AAROM abd 2x15 2-3sec ; min cuing to keep arm flat along table with good carry over   Supine AAROM flex 2x15  2-3sec      Manual  PROM all directions G3 post GHJ mob in 90d abd G3 post GHJ mobs with movement into flex/abd/ER/IR STM to bicep (severe pain to minimal palpation), UT, and pec minor.                        PT Education - 04/26/20 1432    Education Details therex form/technique    Person(s) Educated Patient    Methods Explanation;Demonstration;Verbal cues    Comprehension Verbalized understanding;Returned demonstration;Verbal cues required            PT Short Term Goals - 04/19/20 1241      PT SHORT TERM GOAL #1   Title Pt will be independent with HEP in order to improve shoulder ROM in order to improve function of household ADLs    Time 4    Period Weeks    Status New             PT Long Term Goals -  04/19/20 1242      PT LONG TERM GOAL #1   Title Pt will decrease worst shoulder pain on NPRS in order to demonstrate clinically significant reduction in pain    Baseline 04/19/20 9/10    Time 8    Period Weeks    Status New      PT LONG TERM GOAL #2   Title Pt will demonstrate full AROM of the left shoulder in order to demonstrate full mobility to perform household ADLs and community activity    Baseline 04/19/20 Flex 140 abd 112  ER 60 IR 80    Time 8    Period Weeks    Status New      PT LONG TERM GOAL #3   Title Pt will demonstrate ability to place a 10# DB to a shelf overhead level to demonstrate increased strength and mobility to perform heavy household ADLs    Baseline 04/19/20 eye level with no weight    Time 8    Period Weeks    Status New      PT LONG TERM GOAL #4   Title Pt will increase L shoulder flex/abd MMT to 5/5 to demonstrate increase in strength to perform heavy household ADLs and community activity.    Baseline 04/19/20 L shoulder Flex 4, Abd 4    Time 8    Period Weeks    Status New                 Plan - 04/26/20 1434    Clinical Impression Statement PT initiated therex for increased shoulder ROM and strength and periscapular strength for carry over into overhead functional movements with success. pt performed all therex with proper technique following demonstrating and cuing. Pt tolerated therex and mobilizations with small increases in pain level and good motivation throughout session. PT will continue to progress as able.    Personal Factors and Comorbidities Fitness;Comorbidity 1;Past/Current Experience    Examination-Activity Limitations Lift;Reach Overhead;Caring for Others;Carry;Bed Mobility    Examination-Participation Restrictions Community Activity;Cleaning;Laundry    Stability/Clinical Decision Making Evolving/Moderate complexity    Clinical Decision Making Moderate    Rehab Potential Fair    Clinical Impairments Affecting Rehab Potential (-)  transportation, multiple comorbidities (DM II, obesity, GERD, communication barrier, sedentary lifestyle (+) positive attitude,     PT Frequency 2x / week    PT Duration 8 weeks    PT Treatment/Interventions Neuromuscular re-education;Passive range of motion;Manual techniques;Patient/family education;Taping;Therapeutic  exercise;Therapeutic activities;Functional mobility training;Moist Heat;Ultrasound;Cryotherapy;Electrical Stimulation;Dry needling;Balance training;Stair training;Gait training;ADLs/Self Care Home Management    PT Next Visit Plan HEP review, POC    PT Home Exercise Plan AAROM with dowel of shoulder flex, abd, ER, IR    Consulted and Agree with Plan of Care Patient           Patient will benefit from skilled therapeutic intervention in order to improve the following deficits and impairments:  Abnormal gait,Pain,Improper body mechanics,Postural dysfunction,Decreased activity tolerance,Decreased endurance,Decreased range of motion,Decreased strength,Decreased balance,Difficulty walking,Impaired flexibility,Hypomobility,Decreased mobility,Impaired UE functional use  Visit Diagnosis: Chronic left shoulder pain  Stiffness of left shoulder, not elsewhere classified     Problem List Patient Active Problem List   Diagnosis Date Noted  . Lymphedema 01/17/2016  . Venous (peripheral) insufficiency 01/17/2016  . Pain in limb 01/17/2016  . Iron deficiency anemia due to chronic blood loss 01/09/2016  . History of endometrial cancer 08/01/2015  . Endometrial adenocarcinoma ([place]) 07/13/2015    Class: History of  . Inguinal lymphadenopathy 07/13/2015  . Long term current use of insulin ([place]) 12/12/2013  . Microalbuminuria 12/12/2013  . Diabetes ([place]) 07/28/2013  . BP (high blood pressure) 07/28/2013  . HLD (hyperlipidemia) 07/28/2013  . Adiposity 07/28/2013  . Obstructive apnea 07/28/2013  . Arthritis, degenerative 07/28/2013  . Apnea, sleep 07/28/2013  . Thyroid nodule  07/28/2013     Durwin Reges DPT Turner Daniels, SPT  Durwin Reges 04/26/2020, 5:26 PM  [place] [facility] [address] [place], [address] Phone: [phone]   Fax:  [phone]  Name: Amity Roes MRN: [id number] Date of Birth: 12/30/1960

## 2020-05-01 ENCOUNTER — Ambulatory Visit: Payer: Medicare Other | Attending: Internal Medicine | Admitting: Physical Therapy

## 2020-05-01 ENCOUNTER — Other Ambulatory Visit: Payer: Self-pay

## 2020-05-01 ENCOUNTER — Encounter: Payer: Self-pay | Admitting: Physical Therapy

## 2020-05-01 DIAGNOSIS — M25612 Stiffness of left shoulder, not elsewhere classified: Secondary | ICD-10-CM | POA: Diagnosis present

## 2020-05-01 DIAGNOSIS — M25512 Pain in left shoulder: Secondary | ICD-10-CM | POA: Diagnosis not present

## 2020-05-01 DIAGNOSIS — G8929 Other chronic pain: Secondary | ICD-10-CM | POA: Insufficient documentation

## 2020-05-01 NOTE — Therapy (Signed)
[place] [facility] [address] [place], [address] Phone: [phone]   Fax:  [phone]  Physical Therapy Treatment  Patient Details  Name: Christine Lawson MRN: [id number] Date of Birth: [birth year] No data recorded  Encounter Date: 05/01/2020   PT End of Session - 05/01/20 1446    Visit Number 3    Number of Visits 17    Date for PT Re-Evaluation 06/14/20    Authorization - Visit Number 2    Authorization - Number of Visits 17    PT Start Time 1430    PT Stop Time 1515    PT Time Calculation (min) 45 min    Activity Tolerance Patient tolerated treatment well    Behavior During Therapy [facility] for tasks assessed/performed           Past Medical History:  Diagnosis Date  . Diabetes mellitus without complication ([place])   . Endometrial adenocarcinoma ([place])   . GERD (gastroesophageal reflux disease)   . Hematochezia   . Hyperlipidemia   . Hypertension   . IDA (iron deficiency anemia)   . Inguinal lymphadenopathy 07/13/2015  . Irritable bowel syndrome   . Obesity   . Osteoarthritis   . Sleep apnea     Past Surgical History:  Procedure Laterality Date  . ABDOMINAL HYSTERECTOMY  feb 2013  . COLONOSCOPY WITH PROPOFOL N/A 08/02/2015   Procedure: COLONOSCOPY WITH PROPOFOL;  Surgeon: Manya Silvas, MD;  Location: [facility] ENDOSCOPY;  Service: Endoscopy;  Laterality: N/A;  . ESOPHAGOGASTRODUODENOSCOPY (EGD) WITH PROPOFOL N/A 08/02/2015   Procedure: ESOPHAGOGASTRODUODENOSCOPY (EGD) WITH PROPOFOL;  Surgeon: Manya Silvas, MD;  Location: [facility] ENDOSCOPY;  Service: Endoscopy;  Laterality: N/A;    There were no vitals filed for this visit.   Subjective Assessment - 05/01/20 1430    Subjective Reports 7/10 pain in L shoulders, 6/10 in R. Reports she did some of her exercises with her pulleys and cane over the weekend.    Pertinent History Pt is a 60 y.o female with c/c of bilateral shoulder pain with more concerns for  the left. Pain started a few months ago with no MOI. Pain is currently 7/10 Worst 9/10 Best 3/10. Pain is aggrevated with lifting overhead, exercises, pulling and pushing. Pain is eased with rest and medication. Pain sometimes wakes her up at night but is able to go back to sleep with position change. Pain is better in the morning and worse as day goes on. Pt does not work currently. Pt would like to get shoulder back to PLOF. Denies N/T down the arm, B/B changes, changes in weight, or night pains.    Limitations House hold activities;Lifting    How long can you sit comfortably? 10 min    How long can you stand comfortably? unlimited    How long can you walk comfortably? unlimited    Diagnostic tests none    Patient Stated Goals return shoulder to PLOF    Pain Onset More than a month ago    Pain Onset 1 to 4 weeks ago             Ther-Ex  HEP Review  Pulleys flex /abd for 10 minwith cuing for scapulohumeral rhythm andcues to keep cervical in neutraland prevent shoulder hike  Seated AAROM dowel flex with towel roll at tspine for increased postural awareness (visibly decreased LUE mobility)  Seated thoracic ext with hands behind head over towel roll x12  Standing bilat ER RTB  3x 10 with cuing for scapular retraction with good carry over  Lat pulldowns 15# x10; 20# 2x 10 with max cuing needed initially with good carry over  Doorway pec stretch 34min  Manual PROM all directions G3post GHJmobswithmovement intoflex/abd/ER/IR STM to bicep, pec minor, and latissimus (pain to mild palpation)                         PT Education - 05/01/20 1445    Education provided Yes    Education Details therex form/technique    Person(s) Educated Patient    Methods Explanation;Demonstration;Verbal cues    Comprehension Verbalized understanding;Returned demonstration;Verbal cues required            PT Short Term Goals - 04/19/20 1241      PT SHORT TERM GOAL #1    Title Pt will be independent with HEP in order to improve shoulder ROM in order to improve function of household ADLs    Time 4    Period Weeks    Status New             PT Long Term Goals - 04/19/20 1242      PT LONG TERM GOAL #1   Title Pt will decrease worst shoulder pain on NPRS in order to demonstrate clinically significant reduction in pain    Baseline 04/19/20 9/10    Time 8    Period Weeks    Status New      PT LONG TERM GOAL #2   Title Pt will demonstrate full AROM of the left shoulder in order to demonstrate full mobility to perform household ADLs and community activity    Baseline 04/19/20 Flex 140 abd 112  ER 60 IR 80    Time 8    Period Weeks    Status New      PT LONG TERM GOAL #3   Title Pt will demonstrate ability to place a 10# DB to a shelf overhead level to demonstrate increased strength and mobility to perform heavy household ADLs    Baseline 04/19/20 eye level with no weight    Time 8    Period Weeks    Status New      PT LONG TERM GOAL #4   Title Pt will increase L shoulder flex/abd MMT to 5/5 to demonstrate increase in strength to perform heavy household ADLs and community activity.    Baseline 04/19/20 L shoulder Flex 4, Abd 4    Time 8    Period Weeks    Status New                 Plan - 05/01/20 1507    Clinical Impression Statement PT continued therex progression for increased bilat shoulder ROM L>R and periscapular strength with success. Patient is able to comply with all cuing for proper technqiue of therex with good motivation and minimal increase in pain throughout session. Patient with evident decreased L>R shoulder mobility and strength through therex. PT will continue progression as able.    Personal Factors and Comorbidities Fitness;Comorbidity 1;Past/Current Experience    Examination-Activity Limitations Lift;Reach Overhead;Caring for Others;Carry;Bed Mobility    Examination-Participation Restrictions Community  Activity;Cleaning;Laundry    Stability/Clinical Decision Making Evolving/Moderate complexity    Clinical Decision Making Moderate    Clinical Impairments Affecting Rehab Potential (-) transportation, multiple comorbidities (DM II, obesity, GERD, communication barrier, sedentary lifestyle (+) positive attitude,     PT Frequency 2x / week    PT  Duration 8 weeks    PT Treatment/Interventions Neuromuscular re-education;Passive range of motion;Manual techniques;Patient/family education;Taping;Therapeutic exercise;Therapeutic activities;Functional mobility training;Moist Heat;Ultrasound;Cryotherapy;Electrical Stimulation;Dry needling;Balance training;Stair training;Gait training;ADLs/Self Care Home Management    PT Next Visit Plan HEP review, POC    PT Home Exercise Plan AAROM with dowel of shoulder flex, abd, ER, IR    Consulted and Agree with Plan of Care Patient           Patient will benefit from skilled therapeutic intervention in order to improve the following deficits and impairments:  Abnormal gait,Pain,Improper body mechanics,Postural dysfunction,Decreased activity tolerance,Decreased endurance,Decreased range of motion,Decreased strength,Decreased balance,Difficulty walking,Impaired flexibility,Hypomobility,Decreased mobility,Impaired UE functional use  Visit Diagnosis: Chronic left shoulder pain  Stiffness of left shoulder, not elsewhere classified     Problem List Patient Active Problem List   Diagnosis Date Noted  . Lymphedema 01/17/2016  . Venous (peripheral) insufficiency 01/17/2016  . Pain in limb 01/17/2016  . Iron deficiency anemia due to chronic blood loss 01/09/2016  . History of endometrial cancer 08/01/2015  . Endometrial adenocarcinoma ([place]) 07/13/2015    Class: History of  . Inguinal lymphadenopathy 07/13/2015  . Long term current use of insulin ([place]) 12/12/2013  . Microalbuminuria 12/12/2013  . Diabetes ([place]) 07/28/2013  . BP (high blood pressure) 07/28/2013   . HLD (hyperlipidemia) 07/28/2013  . Adiposity 07/28/2013  . Obstructive apnea 07/28/2013  . Arthritis, degenerative 07/28/2013  . Apnea, sleep 07/28/2013  . Thyroid nodule 07/28/2013   Durwin Reges DPT Durwin Reges 05/01/2020, 3:24 PM  [place] [facility] [address] [place], [address] Phone: [phone]   Fax:  [phone]  Name: Christine Lawson MRN: [id number] Date of Birth: 05-Mar-1960

## 2020-05-03 ENCOUNTER — Encounter: Payer: Self-pay | Admitting: Physical Therapy

## 2020-05-03 ENCOUNTER — Ambulatory Visit: Payer: Medicare Other | Admitting: Physical Therapy

## 2020-05-03 ENCOUNTER — Other Ambulatory Visit: Payer: Self-pay

## 2020-05-03 DIAGNOSIS — M25612 Stiffness of left shoulder, not elsewhere classified: Secondary | ICD-10-CM

## 2020-05-03 DIAGNOSIS — M25512 Pain in left shoulder: Secondary | ICD-10-CM | POA: Diagnosis not present

## 2020-05-03 DIAGNOSIS — G8929 Other chronic pain: Secondary | ICD-10-CM

## 2020-05-03 NOTE — Therapy (Signed)
[place] [facility] [address], [place], [address] Phone: [phone]   Fax:  [phone]  Physical Therapy Treatment  Patient Details  Name: Shakyra Mattera MRN: [id number] Date of Birth: [birth year] No data recorded  Encounter Date: 05/03/2020   PT End of Session - 05/03/20 1435    Visit Number 4    Number of Visits 17    Date for PT Re-Evaluation 06/14/20    Authorization - Visit Number 3    Authorization - Number of Visits 17    PT Start Time 1430    PT Stop Time 1510    PT Time Calculation (min) 40 min    Activity Tolerance Patient tolerated treatment well    Behavior During Therapy [facility] for tasks assessed/performed           Past Medical History:  Diagnosis Date  . Diabetes mellitus without complication ([place])   . Endometrial adenocarcinoma ([place])   . GERD (gastroesophageal reflux disease)   . Hematochezia   . Hyperlipidemia   . Hypertension   . IDA (iron deficiency anemia)   . Inguinal lymphadenopathy 07/13/2015  . Irritable bowel syndrome   . Obesity   . Osteoarthritis   . Sleep apnea     Past Surgical History:  Procedure Laterality Date  . ABDOMINAL HYSTERECTOMY  feb 2013  . COLONOSCOPY WITH PROPOFOL N/A 08/02/2015   Procedure: COLONOSCOPY WITH PROPOFOL;  Surgeon: Manya Silvas, MD;  Location: [facility] ENDOSCOPY;  Service: Endoscopy;  Laterality: N/A;  . ESOPHAGOGASTRODUODENOSCOPY (EGD) WITH PROPOFOL N/A 08/02/2015   Procedure: ESOPHAGOGASTRODUODENOSCOPY (EGD) WITH PROPOFOL;  Surgeon: Manya Silvas, MD;  Location: [facility] ENDOSCOPY;  Service: Endoscopy;  Laterality: N/A;    There were no vitals filed for this visit.   Subjective Assessment - 05/03/20 1429    Subjective Pt reports L shoulder 6/10 pain and right shoulder 6/10. Continued compliance with HEP    Pertinent History Pt is a 60 y.o female with c/c of bilateral shoulder pain with more concerns for the left. Pain started a few months ago  with no MOI. Pain is currently 7/10 Worst 9/10 Best 3/10. Pain is aggrevated with lifting overhead, exercises, pulling and pushing. Pain is eased with rest and medication. Pain sometimes wakes her up at night but is able to go back to sleep with position change. Pain is better in the morning and worse as day goes on. Pt does not work currently. Pt would like to get shoulder back to PLOF. Denies N/T down the arm, B/B changes, changes in weight, or night pains.    Limitations House hold activities;Lifting    How long can you sit comfortably? 10 min    How long can you stand comfortably? unlimited    How long can you walk comfortably? unlimited    Diagnostic tests none    Patient Stated Goals return shoulder to PLOF    Pain Onset More than a month ago    Pain Onset 1 to 4 weeks ago           Ther-Ex   Pulleys flex / abd for 10 min with cuing for scapulohumeral rhythm and cues to keep cervical in neutral and prevent shoulder hike   Seated AAROM dowel flex 3x 10 with towel roll at tspine for increased postural awareness (visibly decreased LUE mobility)   Seated thoracic ext with hands behind head over towel roll x15  Seated RTB pull apart 3x10; w/ towel roll at spine  Standing bilat ER RTB 3x 10 with cuing for scapular retraction with good carry over   Lat pulldowns 3x 10 with max cuing needed initially with good carry over     Manual  PROM all directions G3 post GHJ mobs with movement into flex/abd/ER/IR STM to bicep, pec minor, and latissimus (pain to mild palpation)                           PT Education - 05/03/20 1434    Education Details therex form/technique    Person(s) Educated Patient    Methods Explanation;Demonstration;Verbal cues    Comprehension Verbalized understanding;Returned demonstration;Verbal cues required            PT Short Term Goals - 04/19/20 1241      PT SHORT TERM GOAL #1   Title Pt will be independent with HEP in order to  improve shoulder ROM in order to improve function of household ADLs    Time 4    Period Weeks    Status New             PT Long Term Goals - 04/19/20 1242      PT LONG TERM GOAL #1   Title Pt will decrease worst shoulder pain on NPRS in order to demonstrate clinically significant reduction in pain    Baseline 04/19/20 9/10    Time 8    Period Weeks    Status New      PT LONG TERM GOAL #2   Title Pt will demonstrate full AROM of the left shoulder in order to demonstrate full mobility to perform household ADLs and community activity    Baseline 04/19/20 Flex 140 abd 112  ER 60 IR 80    Time 8    Period Weeks    Status New      PT LONG TERM GOAL #3   Title Pt will demonstrate ability to place a 10# DB to a shelf overhead level to demonstrate increased strength and mobility to perform heavy household ADLs    Baseline 04/19/20 eye level with no weight    Time 8    Period Weeks    Status New      PT LONG TERM GOAL #4   Title Pt will increase L shoulder flex/abd MMT to 5/5 to demonstrate increase in strength to perform heavy household ADLs and community activity.    Baseline 04/19/20 L shoulder Flex 4, Abd 4    Time 8    Period Weeks    Status New                 Plan - 05/03/20 1436    Clinical Impression Statement PT continued therex progression for increased bilat shoulder ROM L>R and periscapular strength. Pt is able to perform therex with proper form followng demonstration and cuing. Pt tolerates increases in joint mobilizations and PROM with min pain increases. PT will continue to progress as able.    Personal Factors and Comorbidities Fitness;Comorbidity 1;Past/Current Experience    Examination-Activity Limitations Lift;Reach Overhead;Caring for Others;Carry;Bed Mobility    Examination-Participation Restrictions Community Activity;Cleaning;Laundry    Stability/Clinical Decision Making Evolving/Moderate complexity    Clinical Decision Making Moderate    Rehab  Potential Fair    Clinical Impairments Affecting Rehab Potential (-) transportation, multiple comorbidities (DM II, obesity, GERD, communication barrier, sedentary lifestyle (+) positive attitude,     PT Frequency 2x / week    PT Duration 8  weeks    PT Treatment/Interventions Neuromuscular re-education;Passive range of motion;Manual techniques;Patient/family education;Taping;Therapeutic exercise;Therapeutic activities;Functional mobility training;Moist Heat;Ultrasound;Cryotherapy;Electrical Stimulation;Dry needling;Balance training;Stair training;Gait training;ADLs/Self Care Home Management    PT Next Visit Plan HEP review, POC    PT Home Exercise Plan AAROM with dowel of shoulder flex, abd, ER, IR    Consulted and Agree with Plan of Care Patient           Patient will benefit from skilled therapeutic intervention in order to improve the following deficits and impairments:  Abnormal gait,Pain,Improper body mechanics,Postural dysfunction,Decreased activity tolerance,Decreased endurance,Decreased range of motion,Decreased strength,Decreased balance,Difficulty walking,Impaired flexibility,Hypomobility,Decreased mobility,Impaired UE functional use  Visit Diagnosis: Chronic left shoulder pain  Stiffness of left shoulder, not elsewhere classified     Problem List Patient Active Problem List   Diagnosis Date Noted  . Lymphedema 01/17/2016  . Venous (peripheral) insufficiency 01/17/2016  . Pain in limb 01/17/2016  . Iron deficiency anemia due to chronic blood loss 01/09/2016  . History of endometrial cancer 08/01/2015  . Endometrial adenocarcinoma ([place]) 07/13/2015    Class: History of  . Inguinal lymphadenopathy 07/13/2015  . Long term current use of insulin ([place]) 12/12/2013  . Microalbuminuria 12/12/2013  . Diabetes ([place]) 07/28/2013  . BP (high blood pressure) 07/28/2013  . HLD (hyperlipidemia) 07/28/2013  . Adiposity 07/28/2013  . Obstructive apnea 07/28/2013  . Arthritis,  degenerative 07/28/2013  . Apnea, sleep 07/28/2013  . Thyroid nodule 07/28/2013    Durwin Reges DPT Turner Daniels, SPT  Durwin Reges 05/03/2020, 4:14 PM  [place] [facility] [address] [place], [address] Phone: [phone]   Fax:  [phone]  Name: Lasean Rahming MRN: [id number] Date of Birth: 1960-11-17

## 2020-05-07 ENCOUNTER — Other Ambulatory Visit: Payer: Self-pay

## 2020-05-07 ENCOUNTER — Ambulatory Visit: Payer: Medicare Other | Admitting: Physical Therapy

## 2020-05-07 ENCOUNTER — Encounter: Payer: Self-pay | Admitting: Physical Therapy

## 2020-05-07 DIAGNOSIS — M25512 Pain in left shoulder: Secondary | ICD-10-CM

## 2020-05-07 DIAGNOSIS — M25612 Stiffness of left shoulder, not elsewhere classified: Secondary | ICD-10-CM

## 2020-05-07 DIAGNOSIS — G8929 Other chronic pain: Secondary | ICD-10-CM

## 2020-05-07 NOTE — Therapy (Signed)
[facility] [address], [place], [address] Phone: [phone]   Fax:  [phone]  Physical Therapy Treatment  Patient Details  Name: Christine Lawson MRN: [id number] Date of Birth: Jan 28, 1961 No data recorded  Encounter Date: 05/07/2020   PT End of Session - 05/07/20 1524    Visit Number 5    Number of Visits 17    Date for PT Re-Evaluation 06/14/20    Authorization - Visit Number 4    Authorization - Number of Visits 17    PT Start Time 0315    PT Stop Time 0400    PT Time Calculation (min) 45 min    Activity Tolerance Patient tolerated treatment well    Behavior During Therapy [facility] for tasks assessed/performed           Past Medical History:  Diagnosis Date   Diabetes mellitus without complication ([place])    Endometrial adenocarcinoma ([place])    GERD (gastroesophageal reflux disease)    Hematochezia    Hyperlipidemia    Hypertension    IDA (iron deficiency anemia)    Inguinal lymphadenopathy 07/13/2015   Irritable bowel syndrome    Obesity    Osteoarthritis    Sleep apnea     Past Surgical History:  Procedure Laterality Date   ABDOMINAL HYSTERECTOMY  feb 2013   COLONOSCOPY WITH PROPOFOL N/A 08/02/2015   Procedure: COLONOSCOPY WITH PROPOFOL;  Surgeon: Manya Silvas, MD;  Location: [facility];  Service: Endoscopy;  Laterality: N/A;   ESOPHAGOGASTRODUODENOSCOPY (EGD) WITH PROPOFOL N/A 08/02/2015   Procedure: ESOPHAGOGASTRODUODENOSCOPY (EGD) WITH PROPOFOL;  Surgeon: Manya Silvas, MD;  Location: [facility] ENDOSCOPY;  Service: Endoscopy;  Laterality: N/A;    There were no vitals filed for this visit.   Subjective Assessment - 05/07/20 1521    Subjective Patient reports 6/10 L shoulder and 5/10 R shoulder pain. Reports compliance with HEP and thinks she is getting better.    Pertinent History Pt is a 60 y.o female with c/c of bilateral shoulder pain with more concerns for the  left. Pain started a few months ago with no MOI. Pain is currently 7/10 Worst 9/10 Best 3/10. Pain is aggrevated with lifting overhead, exercises, pulling and pushing. Pain is eased with rest and medication. Pain sometimes wakes her up at night but is able to go back to sleep with position change. Pain is better in the morning and worse as day goes on. Pt does not work currently. Pt would like to get shoulder back to PLOF. Denies N/T down the arm, B/B changes, changes in weight, or night pains.    Limitations House hold activities;Lifting    How long can you sit comfortably? 10 min    How long can you stand comfortably? unlimited    How long can you walk comfortably? unlimited    Diagnostic tests none    Patient Stated Goals return shoulder to PLOF    Pain Onset More than a month ago    Pain Onset 1 to 4 weeks ago             Ther-Ex   Pulleys flex /abd for 10 minwith cuing for scapulohumeral rhythm andcues to keep cervical in neutraland prevent shoulder hike  Seated AAROM dowel flex 3x 10 with towel roll at tspine for increased postural awareness (visibly decreased LUE mobility)  Seated thoracic ext with hands behind head over towel roll x15  Seated GTB pull apart 3x10; w/ towel roll  at spine  Scaption with GTB pulled apart 3x 10 with min cuing for technique with good carry over following  Lat pulldowns 25# 3x 10 with min cuing for eccentric control with good carry over   Manual PROM all directions G3post GHJmobswithmovement intoflex/abd/ER/IR STM to bicep, pec minor, and latissimus (pain to mild palpation)             PT Education - 05/07/20 1524    Education provided Yes    Education Details therex form/technique    Person(s) Educated Patient    Methods Explanation;Demonstration;Verbal cues    Comprehension Verbalized understanding;Returned demonstration;Verbal cues required            PT Short Term Goals - 04/19/20 1241      PT SHORT  TERM GOAL #1   Title Pt will be independent with HEP in order to improve shoulder ROM in order to improve function of household ADLs    Time 4    Period Weeks    Status New             PT Long Term Goals - 04/19/20 1242      PT LONG TERM GOAL #1   Title Pt will decrease worst shoulder pain on NPRS in order to demonstrate clinically significant reduction in pain    Baseline 04/19/20 9/10    Time 8    Period Weeks    Status New      PT LONG TERM GOAL #2   Title Pt will demonstrate full AROM of the left shoulder in order to demonstrate full mobility to perform household ADLs and community activity    Baseline 04/19/20 Flex 140 abd 112  ER 60 IR 80    Time 8    Period Weeks    Status New      PT LONG TERM GOAL #3   Title Pt will demonstrate ability to place a 10# DB to a shelf overhead level to demonstrate increased strength and mobility to perform heavy household ADLs    Baseline 04/19/20 eye level with no weight    Time 8    Period Weeks    Status New      PT LONG TERM GOAL #4   Title Pt will increase L shoulder flex/abd MMT to 5/5 to demonstrate increase in strength to perform heavy household ADLs and community activity.    Baseline 04/19/20 L shoulder Flex 4, Abd 4    Time 8    Period Weeks    Status New                 Plan - 05/07/20 1533    Clinical Impression Statement PT continued therex progression for increased bilat shoulder ROM and strength, with scapulohumeral rhythm motor control with success. Patient is able to comply with all cuing for progressions of therex with good motivation and no increased pain throughout session. PT will continue progression as able.    Personal Factors and Comorbidities Fitness;Comorbidity 1;Past/Current Experience    Examination-Activity Limitations Lift;Reach Overhead;Caring for Others;Carry;Bed Mobility    Examination-Participation Restrictions Community Activity;Cleaning;Laundry    Stability/Clinical Decision Making  Evolving/Moderate complexity    Clinical Decision Making Moderate    Rehab Potential Fair    Clinical Impairments Affecting Rehab Potential (-) transportation, multiple comorbidities (DM II, obesity, GERD, communication barrier, sedentary lifestyle (+) positive attitude,     PT Frequency 2x / week    PT Duration 8 weeks    PT Treatment/Interventions Neuromuscular re-education;Passive range of  motion;Manual techniques;Patient/family education;Taping;Therapeutic exercise;Therapeutic activities;Functional mobility training;Moist Heat;Ultrasound;Cryotherapy;Electrical Stimulation;Dry needling;Balance training;Stair training;Gait training;ADLs/Self Care Home Management    PT Next Visit Plan HEP review, POC    PT Home Exercise Plan AAROM with dowel of shoulder flex, abd, ER, IR    Consulted and Agree with Plan of Care Patient           Patient will benefit from skilled therapeutic intervention in order to improve the following deficits and impairments:  Abnormal gait,Pain,Improper body mechanics,Postural dysfunction,Decreased activity tolerance,Decreased endurance,Decreased range of motion,Decreased strength,Decreased balance,Difficulty walking,Impaired flexibility,Hypomobility,Decreased mobility,Impaired UE functional use  Visit Diagnosis: Chronic left shoulder pain  Stiffness of left shoulder, not elsewhere classified     Problem List Patient Active Problem List   Diagnosis Date Noted   Lymphedema 01/17/2016   Venous (peripheral) insufficiency 01/17/2016   Pain in limb 01/17/2016   Iron deficiency anemia due to chronic blood loss 01/09/2016   History of endometrial cancer 08/01/2015   Endometrial adenocarcinoma ([place]) 07/13/2015    Class: History of   Inguinal lymphadenopathy 07/13/2015   Long term current use of insulin ([place]) 12/12/2013   Microalbuminuria 12/12/2013   Diabetes ([place]) 07/28/2013   BP (high blood pressure) 07/28/2013   HLD (hyperlipidemia) 07/28/2013    Adiposity 07/28/2013   Obstructive apnea 07/28/2013   Arthritis, degenerative 07/28/2013   Apnea, sleep 07/28/2013   Thyroid nodule 07/28/2013   Durwin Reges DPT Durwin Reges 05/07/2020, 3:53 PM  [facility] [address] [place], [address] Phone: [phone]   Fax:  [phone]  Name: Christine Lawson MRN: [id number] Date of Birth: [birth year]

## 2020-05-09 ENCOUNTER — Ambulatory Visit: Payer: Medicare Other | Admitting: Physical Therapy

## 2020-05-14 ENCOUNTER — Encounter: Payer: Self-pay | Admitting: Physical Therapy

## 2020-05-14 ENCOUNTER — Other Ambulatory Visit: Payer: Self-pay

## 2020-05-14 ENCOUNTER — Ambulatory Visit: Payer: Medicare Other | Admitting: Physical Therapy

## 2020-05-14 DIAGNOSIS — M25512 Pain in left shoulder: Secondary | ICD-10-CM | POA: Diagnosis not present

## 2020-05-14 DIAGNOSIS — G8929 Other chronic pain: Secondary | ICD-10-CM

## 2020-05-14 DIAGNOSIS — M25612 Stiffness of left shoulder, not elsewhere classified: Secondary | ICD-10-CM

## 2020-05-14 NOTE — Therapy (Signed)
[facility] [address] [place], [address] Phone: [phone]   Fax:  [phone]  Physical Therapy Treatment  Patient Details  Name: Christine Lawson MRN: [id number] Date of Birth: 29-Jan-1961 No data recorded  Encounter Date: 05/14/2020   PT End of Session - 05/14/20 1447    Visit Number 6    Number of Visits 17    Date for PT Re-Evaluation 06/14/20    Authorization - Visit Number 6    Authorization - Number of Visits 17    PT Start Time 0230    PT Stop Time 0310    PT Time Calculation (min) 40 min    Activity Tolerance Patient tolerated treatment well    Behavior During Therapy [facility] for tasks assessed/performed           Past Medical History:  Diagnosis Date  . Diabetes mellitus without complication ([place])   . Endometrial adenocarcinoma ([place])   . GERD (gastroesophageal reflux disease)   . Hematochezia   . Hyperlipidemia   . Hypertension   . IDA (iron deficiency anemia)   . Inguinal lymphadenopathy 07/13/2015  . Irritable bowel syndrome   . Obesity   . Osteoarthritis   . Sleep apnea     Past Surgical History:  Procedure Laterality Date  . ABDOMINAL HYSTERECTOMY  feb 2013  . COLONOSCOPY WITH PROPOFOL N/A 08/02/2015   Procedure: COLONOSCOPY WITH PROPOFOL;  Surgeon: Manya Silvas, MD;  Location: [facility] ENDOSCOPY;  Service: Endoscopy;  Laterality: N/A;  . ESOPHAGOGASTRODUODENOSCOPY (EGD) WITH PROPOFOL N/A 08/02/2015   Procedure: ESOPHAGOGASTRODUODENOSCOPY (EGD) WITH PROPOFOL;  Surgeon: Manya Silvas, MD;  Location: [facility] ENDOSCOPY;  Service: Endoscopy;  Laterality: N/A;    There were no vitals filed for this visit.   Subjective Assessment - 05/14/20 1440    Subjective Pt reports compliance with HEP over the weekend. Reports bilat shoulder pain 6/10 today.    Pertinent History Pt is a 60 y.o female with c/c of bilateral shoulder pain with more concerns for the left. Pain started a few months  ago with no MOI. Pain is currently 7/10 Worst 9/10 Best 3/10. Pain is aggrevated with lifting overhead, exercises, pulling and pushing. Pain is eased with rest and medication. Pain sometimes wakes her up at night but is able to go back to sleep with position change. Pain is better in the morning and worse as day goes on. Pt does not work currently. Pt would like to get shoulder back to PLOF. Denies N/T down the arm, B/B changes, changes in weight, or night pains.    Limitations House hold activities;Lifting    How long can you sit comfortably? 10 min    How long can you stand comfortably? unlimited    How long can you walk comfortably? unlimited    Diagnostic tests none    Patient Stated Goals return shoulder to PLOF    Pain Onset More than a month ago    Pain Onset 1 to 4 weeks ago              Ther-Ex  Pulleys flex /abd for 77min each with cuing for scapulohumeral rhythm andcues to keep cervical in neutraland prevent shoulder hike  Lat pulldowns 25# 3x 10 with min cuing for eccentric control with good carry over  Seated AAROM dowel flex with 2# 3x 10 with towel roll at tspine for increased postural awareness (visibly decreased LUE mobility)  Seated overhead press 5# DB 3x 8  with cuing for proper technique with full elbow ext with good carry over    Manual PROM all directions G3post GHJmobswithmovement intoflex/abd/ER/IR STM to bicep, pec minor, and latissimus (pain to mild palpation)                    PT Education - 05/14/20 1446    Education provided Yes    Education Details therex form/technique    Person(s) Educated Patient    Methods Explanation;Demonstration;Verbal cues    Comprehension Verbalized understanding;Returned demonstration;Verbal cues required            PT Short Term Goals - 04/19/20 1241      PT SHORT TERM GOAL #1   Title Pt will be independent with HEP in order to improve shoulder ROM in order to improve function of  household ADLs    Time 4    Period Weeks    Status New             PT Long Term Goals - 04/19/20 1242      PT LONG TERM GOAL #1   Title Pt will decrease worst shoulder pain on NPRS in order to demonstrate clinically significant reduction in pain    Baseline 04/19/20 9/10    Time 8    Period Weeks    Status New      PT LONG TERM GOAL #2   Title Pt will demonstrate full AROM of the left shoulder in order to demonstrate full mobility to perform household ADLs and community activity    Baseline 04/19/20 Flex 140 abd 112  ER 60 IR 80    Time 8    Period Weeks    Status New      PT LONG TERM GOAL #3   Title Pt will demonstrate ability to place a 10# DB to a shelf overhead level to demonstrate increased strength and mobility to perform heavy household ADLs    Baseline 04/19/20 eye level with no weight    Time 8    Period Weeks    Status New      PT LONG TERM GOAL #4   Title Pt will increase L shoulder flex/abd MMT to 5/5 to demonstrate increase in strength to perform heavy household ADLs and community activity.    Baseline 04/19/20 L shoulder Flex 4, Abd 4    Time 8    Period Weeks    Status New                 Plan - 05/14/20 1505    Clinical Impression Statement PT continued therex progression for increased shoulder ROM and strength L>R with success. PT continued focus on scapulohumeral rhythm which patient is able to demonstrate with multimodal cuing. Patient is able to comply with all cuing for proper technique of therex with good motivation throughout session. Patient is demonstrating excellent increases in overhead mobility, with difficulty with gaurding and muscle tension of L pec minor/latissimus/bicep. PT will continue progression as able.    Personal Factors and Comorbidities Fitness;Comorbidity 1;Past/Current Experience    Examination-Activity Limitations Lift;Reach Overhead;Caring for Others;Carry;Bed Mobility    Examination-Participation Restrictions Community  Activity;Cleaning;Laundry    Stability/Clinical Decision Making Evolving/Moderate complexity    Clinical Decision Making Moderate    Rehab Potential Fair    Clinical Impairments Affecting Rehab Potential (-) transportation, multiple comorbidities (DM II, obesity, GERD, communication barrier, sedentary lifestyle (+) positive attitude,     PT Frequency 2x / week    PT Duration  8 weeks    PT Treatment/Interventions Neuromuscular re-education;Passive range of motion;Manual techniques;Patient/family education;Taping;Therapeutic exercise;Therapeutic activities;Functional mobility training;Moist Heat;Ultrasound;Cryotherapy;Electrical Stimulation;Dry needling;Balance training;Stair training;Gait training;ADLs/Self Care Home Management    PT Next Visit Plan HEP review, POC    PT Home Exercise Plan AAROM with dowel of shoulder flex, abd, ER, IR    Consulted and Agree with Plan of Care Patient           Patient will benefit from skilled therapeutic intervention in order to improve the following deficits and impairments:  Abnormal gait,Pain,Improper body mechanics,Postural dysfunction,Decreased activity tolerance,Decreased endurance,Decreased range of motion,Decreased strength,Decreased balance,Difficulty walking,Impaired flexibility,Hypomobility,Decreased mobility,Impaired UE functional use  Visit Diagnosis: Chronic left shoulder pain  Stiffness of left shoulder, not elsewhere classified     Problem List Patient Active Problem List   Diagnosis Date Noted  . Lymphedema 01/17/2016  . Venous (peripheral) insufficiency 01/17/2016  . Pain in limb 01/17/2016  . Iron deficiency anemia due to chronic blood loss 01/09/2016  . History of endometrial cancer 08/01/2015  . Endometrial adenocarcinoma ([place]) 07/13/2015    Class: History of  . Inguinal lymphadenopathy 07/13/2015  . Long term current use of insulin ([place]) 12/12/2013  . Microalbuminuria 12/12/2013  . Diabetes ([place]) 07/28/2013  . BP (high  blood pressure) 07/28/2013  . HLD (hyperlipidemia) 07/28/2013  . Adiposity 07/28/2013  . Obstructive apnea 07/28/2013  . Arthritis, degenerative 07/28/2013  . Apnea, sleep 07/28/2013  . Thyroid nodule 07/28/2013   Durwin Reges DPT Durwin Reges 05/14/2020, 4:13 PM  [facility] [address] [place], [address] Phone: [phone]   Fax:  [phone]  Name: Christine Lawson MRN: [id number] Date of Birth: 1960/08/10

## 2020-05-16 ENCOUNTER — Other Ambulatory Visit: Payer: Self-pay

## 2020-05-16 ENCOUNTER — Ambulatory Visit: Payer: Medicare Other | Admitting: Physical Therapy

## 2020-05-16 ENCOUNTER — Encounter: Payer: Self-pay | Admitting: Physical Therapy

## 2020-05-16 DIAGNOSIS — M25612 Stiffness of left shoulder, not elsewhere classified: Secondary | ICD-10-CM

## 2020-05-16 DIAGNOSIS — M25512 Pain in left shoulder: Secondary | ICD-10-CM

## 2020-05-16 DIAGNOSIS — G8929 Other chronic pain: Secondary | ICD-10-CM

## 2020-05-16 NOTE — Therapy (Signed)
[place] [facility] [address] [place], [address] Phone: [phone]   Fax:  [phone]  Physical Therapy Treatment  Patient Details  Name: Ashla Murph MRN: [id number] Date of Birth: 1960-12-19 No data recorded  Encounter Date: 05/16/2020   PT End of Session - 05/16/20 1447    Visit Number 7    Number of Visits 17    Date for PT Re-Evaluation 06/14/20    Authorization - Visit Number 7    Authorization - Number of Visits 17    PT Start Time 4854    PT Stop Time 1510    PT Time Calculation (min) 42 min    Activity Tolerance Patient tolerated treatment well    Behavior During Therapy [facility] for tasks assessed/performed           Past Medical History:  Diagnosis Date  . Diabetes mellitus without complication ([place])   . Endometrial adenocarcinoma ([place])   . GERD (gastroesophageal reflux disease)   . Hematochezia   . Hyperlipidemia   . Hypertension   . IDA (iron deficiency anemia)   . Inguinal lymphadenopathy 07/13/2015  . Irritable bowel syndrome   . Obesity   . Osteoarthritis   . Sleep apnea     Past Surgical History:  Procedure Laterality Date  . ABDOMINAL HYSTERECTOMY  feb 2013  . COLONOSCOPY WITH PROPOFOL N/A 08/02/2015   Procedure: COLONOSCOPY WITH PROPOFOL;  Surgeon: Manya Silvas, MD;  Location: [facility] ENDOSCOPY;  Service: Endoscopy;  Laterality: N/A;  . ESOPHAGOGASTRODUODENOSCOPY (EGD) WITH PROPOFOL N/A 08/02/2015   Procedure: ESOPHAGOGASTRODUODENOSCOPY (EGD) WITH PROPOFOL;  Surgeon: Manya Silvas, MD;  Location: [facility] ENDOSCOPY;  Service: Endoscopy;  Laterality: N/A;    There were no vitals filed for this visit.   Subjective Assessment - 05/16/20 1433    Subjective Patient reports compliance with HEP. reports 6/10 pain today in L shoulder, 5/10 in R shoulder.    Pertinent History Pt is a 60 y.o female with c/c of bilateral shoulder pain with more concerns for the left. Pain started a few  months ago with no MOI. Pain is currently 7/10 Worst 9/10 Best 3/10. Pain is aggrevated with lifting overhead, exercises, pulling and pushing. Pain is eased with rest and medication. Pain sometimes wakes her up at night but is able to go back to sleep with position change. Pain is better in the morning and worse as day goes on. Pt does not work currently. Pt would like to get shoulder back to PLOF. Denies N/T down the arm, B/B changes, changes in weight, or night pains.    Limitations House hold activities;Lifting    How long can you sit comfortably? 10 min    How long can you stand comfortably? unlimited    How long can you walk comfortably? unlimited    Diagnostic tests none    Patient Stated Goals return shoulder to PLOF    Pain Onset More than a month ago    Pain Onset 1 to 4 weeks ago              Ther-Ex  Pulleys flex /abd for 8min each with cuing for scapulohumeral rhythm andcues to keep cervical in neutraland prevent shoulder hike  Lat pulldowns25#x10; 35# 2x 8 withmin cuing for eccentric control with good carry over  Wall clocks with YTB x3 sets with demo and max cuing needed initially with good carry over  Y on wall with YTB 2x 10 with min cuing  for proper technique  Seated AAROM dowel flex with 3# x15 with towel roll at tspine for increased postural awareness; much better LUE flex than previous session  Manual PROM all directions G3post GHJmobswithmovement intoflex/abd/ER/IR STM to bicep, pec minor, and latissimus (pain to mild palpation)               PT Education - 05/16/20 1446    Education provided Yes    Education Details therex form/technique    Person(s) Educated Patient    Methods Explanation;Demonstration;Verbal cues    Comprehension Verbalized understanding;Returned demonstration;Verbal cues required            PT Short Term Goals - 04/19/20 1241      PT SHORT TERM GOAL #1   Title Pt will be independent with HEP in order  to improve shoulder ROM in order to improve function of household ADLs    Time 4    Period Weeks    Status New             PT Long Term Goals - 04/19/20 1242      PT LONG TERM GOAL #1   Title Pt will decrease worst shoulder pain on NPRS in order to demonstrate clinically significant reduction in pain    Baseline 04/19/20 9/10    Time 8    Period Weeks    Status New      PT LONG TERM GOAL #2   Title Pt will demonstrate full AROM of the left shoulder in order to demonstrate full mobility to perform household ADLs and community activity    Baseline 04/19/20 Flex 140 abd 112  ER 60 IR 80    Time 8    Period Weeks    Status New      PT LONG TERM GOAL #3   Title Pt will demonstrate ability to place a 10# DB to a shelf overhead level to demonstrate increased strength and mobility to perform heavy household ADLs    Baseline 04/19/20 eye level with no weight    Time 8    Period Weeks    Status New      PT LONG TERM GOAL #4   Title Pt will increase L shoulder flex/abd MMT to 5/5 to demonstrate increase in strength to perform heavy household ADLs and community activity.    Baseline 04/19/20 L shoulder Flex 4, Abd 4    Time 8    Period Weeks    Status New                 Plan - 05/16/20 1509    Clinical Impression Statement PT continued therex progression for increased shoulder ROM and strength L>R with success. Pt with increased L shoulder ROM this session with near symmetrical AAROM to RUE. Patient is able to comply with all cuing for proper technique of therex with good motivation and no pain throughout session. PT will continue progression as able.    Personal Factors and Comorbidities Fitness;Comorbidity 1;Past/Current Experience    Examination-Activity Limitations Lift;Reach Overhead;Caring for Others;Carry;Bed Mobility    Examination-Participation Restrictions Community Activity;Cleaning;Laundry    Stability/Clinical Decision Making Evolving/Moderate complexity     Clinical Decision Making Moderate    Rehab Potential Fair    Clinical Impairments Affecting Rehab Potential (-) transportation, multiple comorbidities (DM II, obesity, GERD, communication barrier, sedentary lifestyle (+) positive attitude,     PT Frequency 2x / week    PT Duration 8 weeks    PT Treatment/Interventions Neuromuscular re-education;Passive range  of motion;Manual techniques;Patient/family education;Taping;Therapeutic exercise;Therapeutic activities;Functional mobility training;Moist Heat;Ultrasound;Cryotherapy;Electrical Stimulation;Dry needling;Balance training;Stair training;Gait training;ADLs/Self Care Home Management    PT Next Visit Plan HEP review, POC    PT Home Exercise Plan AAROM with dowel of shoulder flex, abd, ER, IR    Consulted and Agree with Plan of Care Patient           Patient will benefit from skilled therapeutic intervention in order to improve the following deficits and impairments:  Abnormal gait,Pain,Improper body mechanics,Postural dysfunction,Decreased activity tolerance,Decreased endurance,Decreased range of motion,Decreased strength,Decreased balance,Difficulty walking,Impaired flexibility,Hypomobility,Decreased mobility,Impaired UE functional use  Visit Diagnosis: Chronic left shoulder pain  Stiffness of left shoulder, not elsewhere classified     Problem List Patient Active Problem List   Diagnosis Date Noted  . Lymphedema 01/17/2016  . Venous (peripheral) insufficiency 01/17/2016  . Pain in limb 01/17/2016  . Iron deficiency anemia due to chronic blood loss 01/09/2016  . History of endometrial cancer 08/01/2015  . Endometrial adenocarcinoma ([place]) 07/13/2015    Class: History of  . Inguinal lymphadenopathy 07/13/2015  . Long term current use of insulin ([place]) 12/12/2013  . Microalbuminuria 12/12/2013  . Diabetes ([place]) 07/28/2013  . BP (high blood pressure) 07/28/2013  . HLD (hyperlipidemia) 07/28/2013  . Adiposity 07/28/2013  .  Obstructive apnea 07/28/2013  . Arthritis, degenerative 07/28/2013  . Apnea, sleep 07/28/2013  . Thyroid nodule 07/28/2013   Durwin Reges DPT Durwin Reges 05/16/2020, 3:12 PM  [place] [facility] [address], [place], [address] Phone: [phone]   Fax:  [phone]  Name: Meleena Munroe MRN: [id number] Date of Birth: 03-11-1960

## 2020-05-21 ENCOUNTER — Ambulatory Visit: Payer: Medicare Other | Admitting: Physical Therapy

## 2020-05-22 ENCOUNTER — Other Ambulatory Visit: Payer: Self-pay

## 2020-05-22 ENCOUNTER — Ambulatory Visit: Payer: Medicare Other | Admitting: Physical Therapy

## 2020-05-22 ENCOUNTER — Encounter: Payer: Self-pay | Admitting: Physical Therapy

## 2020-05-22 DIAGNOSIS — M25512 Pain in left shoulder: Secondary | ICD-10-CM | POA: Diagnosis not present

## 2020-05-22 DIAGNOSIS — G8929 Other chronic pain: Secondary | ICD-10-CM

## 2020-05-22 NOTE — Therapy (Signed)
[place] [facility] [address] [place], [address] Phone: [phone]   Fax:  [phone]  Physical Therapy Treatment  Patient Details  Name: Christine Lawson MRN: [id number] Date of Birth: 1960/12/01 No data recorded  Encounter Date: 05/22/2020   PT End of Session - 05/22/20 1445    Visit Number 8    Number of Visits 17    Date for PT Re-Evaluation 06/14/20    Authorization - Visit Number 8    Authorization - Number of Visits 17    PT Start Time 8841    PT Stop Time 1440    PT Time Calculation (min) 45 min    Activity Tolerance Patient tolerated treatment well    Behavior During Therapy [facility] for tasks assessed/performed           Past Medical History:  Diagnosis Date  . Diabetes mellitus without complication ([place])   . Endometrial adenocarcinoma ([place])   . GERD (gastroesophageal reflux disease)   . Hematochezia   . Hyperlipidemia   . Hypertension   . IDA (iron deficiency anemia)   . Inguinal lymphadenopathy 07/13/2015  . Irritable bowel syndrome   . Obesity   . Osteoarthritis   . Sleep apnea     Past Surgical History:  Procedure Laterality Date  . ABDOMINAL HYSTERECTOMY  feb 2013  . COLONOSCOPY WITH PROPOFOL N/A 08/02/2015   Procedure: COLONOSCOPY WITH PROPOFOL;  Surgeon: Manya Silvas, MD;  Location: [facility] ENDOSCOPY;  Service: Endoscopy;  Laterality: N/A;  . ESOPHAGOGASTRODUODENOSCOPY (EGD) WITH PROPOFOL N/A 08/02/2015   Procedure: ESOPHAGOGASTRODUODENOSCOPY (EGD) WITH PROPOFOL;  Surgeon: Manya Silvas, MD;  Location: [facility] ENDOSCOPY;  Service: Endoscopy;  Laterality: N/A;    There were no vitals filed for this visit.   Subjective Assessment - 05/22/20 1358    Subjective Patient reports 6.5/10 on L, 5.5/10 on R shoulders. Patient reports HEP is going well.    Pertinent History Pt is a 60 y.o female with c/c of bilateral shoulder pain with more concerns for the left. Pain started a few months ago  with no MOI. Pain is currently 7/10 Worst 9/10 Best 3/10. Pain is aggrevated with lifting overhead, exercises, pulling and pushing. Pain is eased with rest and medication. Pain sometimes wakes her up at night but is able to go back to sleep with position change. Pain is better in the morning and worse as day goes on. Pt does not work currently. Pt would like to get shoulder back to PLOF. Denies N/T down the arm, B/B changes, changes in weight, or night pains.    Limitations House hold activities;Lifting    How long can you sit comfortably? 10 min    How long can you stand comfortably? unlimited    How long can you walk comfortably? unlimited    Diagnostic tests none    Patient Stated Goals return shoulder to PLOF    Currently in Pain? Yes    Pain Score 6     Pain Location Shoulder    Pain Onset More than a month ago    Pain Frequency Constant    Pain Onset 1 to 4 weeks ago          Ther-Ex  Pulleys flex /abd for2 min eachwith cueing for slow movement and hold at the top  Lat pulldowns35#x 10, 2 sets withmin cuing for eccentric control  Seated Y with 3# weights, no back support, cueing to keep spine in alignment and squeeze  shoulder blades, 3 sets of 10  Seated ER with RTB  - 2 sets of 12, bilaterally, tactile cues to keep elbow against side  Seated AAROM with dowel  3#, 2 sets of 12, verbal cueing to keep head up and have good posture, tactile cueing to raise arms up higher      Manual G3 inf GHJmobswithmovement intoflex/abd G3 post mobs with movement into ER/IR PROM all directions STM to bicep, pec minor, and latissimus (pain to mild palpation)                            PT Education - 05/22/20 1443    Education provided Yes    Education Details therex form/technique    Person(s) Educated Patient    Methods Explanation;Demonstration;Verbal cues    Comprehension Verbalized understanding;Returned demonstration;Verbal cues required            PT Short Term Goals - 04/19/20 1241      PT SHORT TERM GOAL #1   Title Pt will be independent with HEP in order to improve shoulder ROM in order to improve function of household ADLs    Time 4    Period Weeks    Status New             PT Long Term Goals - 04/19/20 1242      PT LONG TERM GOAL #1   Title Pt will decrease worst shoulder pain on NPRS in order to demonstrate clinically significant reduction in pain    Baseline 04/19/20 9/10    Time 8    Period Weeks    Status New      PT LONG TERM GOAL #2   Title Pt will demonstrate full AROM of the left shoulder in order to demonstrate full mobility to perform household ADLs and community activity    Baseline 04/19/20 Flex 140 abd 112  ER 60 IR 80    Time 8    Period Weeks    Status New      PT LONG TERM GOAL #3   Title Pt will demonstrate ability to place a 10# DB to a shelf overhead level to demonstrate increased strength and mobility to perform heavy household ADLs    Baseline 04/19/20 eye level with no weight    Time 8    Period Weeks    Status New      PT LONG TERM GOAL #4   Title Pt will increase L shoulder flex/abd MMT to 5/5 to demonstrate increase in strength to perform heavy household ADLs and community activity.    Baseline 04/19/20 L shoulder Flex 4, Abd 4    Time 8    Period Weeks    Status New                 Plan - 05/22/20 1447    Clinical Impression Statement PT continued therex progression for increased shoulder ROM and strength. Patient had more symmetrical ROM and strength and was able to tolerate progressions with no increase in symtpoms. patient tolerated mobilizations well and PROM increased at the end of the session. Patient was able to comply with all cues to allow for proper technique. PT will continue progression as able.    Personal Factors and Comorbidities Fitness;Comorbidity 1;Past/Current Experience    Examination-Activity Limitations Lift;Reach Overhead;Caring for Others;Carry;Bed  Mobility    Examination-Participation Restrictions Community Activity;Cleaning;Laundry    Stability/Clinical Decision Making Evolving/Moderate complexity    Clinical  Decision Making Moderate    Rehab Potential Fair    Clinical Impairments Affecting Rehab Potential (-) transportation, multiple comorbidities (DM II, obesity, GERD, communication barrier, sedentary lifestyle (+) positive attitude,     PT Frequency 2x / week    PT Duration 8 weeks    PT Treatment/Interventions Neuromuscular re-education;Passive range of motion;Manual techniques;Patient/family education;Taping;Therapeutic exercise;Therapeutic activities;Functional mobility training;Moist Heat;Ultrasound;Cryotherapy;Electrical Stimulation;Dry needling;Balance training;Stair training;Gait training;ADLs/Self Care Home Management    PT Next Visit Plan review HEP, continue exercise progressions    PT Home Exercise Plan AAROM with dowel of shoulder flex, abd, ER, IR    Consulted and Agree with Plan of Care Patient         Patient will benefit from skilled therapeutic intervention in order to improve the following deficits and impairments:  Abnormal gait,Pain,Improper body mechanics,Postural dysfunction,Decreased activity tolerance,Decreased endurance,Decreased range of motion,Decreased strength,Decreased balance,Difficulty walking,Impaired flexibility,Hypomobility,Decreased mobility,Impaired UE functional use  Visit Diagnosis: Chronic left shoulder pain     Problem List Patient Active Problem List   Diagnosis Date Noted  . Lymphedema 01/17/2016  . Venous (peripheral) insufficiency 01/17/2016  . Pain in limb 01/17/2016  . Iron deficiency anemia due to chronic blood loss 01/09/2016  . History of endometrial cancer 08/01/2015  . Endometrial adenocarcinoma ([place]) 07/13/2015    Class: History of  . Inguinal lymphadenopathy 07/13/2015  . Long term current use of insulin ([place]) 12/12/2013  . Microalbuminuria 12/12/2013  . Diabetes  ([place]) 07/28/2013  . BP (high blood pressure) 07/28/2013  . HLD (hyperlipidemia) 07/28/2013  . Adiposity 07/28/2013  . Obstructive apnea 07/28/2013  . Arthritis, degenerative 07/28/2013  . Apnea, sleep 07/28/2013  . Thyroid nodule 07/28/2013    Durwin Reges DPT [address] SPT  Durwin Reges 05/22/2020, 4:04 PM  [place] [facility] [address], [place], [address] Phone: [phone]   Fax:  [phone]  Name: Deeksha Cotrell MRN: [id number] Date of Birth: June 13, 1960

## 2020-05-23 ENCOUNTER — Ambulatory Visit: Payer: Medicare Other | Admitting: Physical Therapy

## 2020-05-23 DIAGNOSIS — M25512 Pain in left shoulder: Secondary | ICD-10-CM | POA: Diagnosis not present

## 2020-05-23 DIAGNOSIS — G8929 Other chronic pain: Secondary | ICD-10-CM

## 2020-05-23 NOTE — Therapy (Signed)
[place] [facility] [address], [place], [address] Phone: [phone]   Fax:  [phone]  Physical Therapy Treatment  Patient Details  Name: Christine Lawson MRN: [id number] Date of Birth: 06-Apr-1960 No data recorded  Encounter Date: 05/23/2020    Past Medical History:  Diagnosis Date  . Diabetes mellitus without complication ([place])   . Endometrial adenocarcinoma ([place])   . GERD (gastroesophageal reflux disease)   . Hematochezia   . Hyperlipidemia   . Hypertension   . IDA (iron deficiency anemia)   . Inguinal lymphadenopathy 07/13/2015  . Irritable bowel syndrome   . Obesity   . Osteoarthritis   . Sleep apnea     Past Surgical History:  Procedure Laterality Date  . ABDOMINAL HYSTERECTOMY  feb 2013  . COLONOSCOPY WITH PROPOFOL N/A 08/02/2015   Procedure: COLONOSCOPY WITH PROPOFOL;  Surgeon: Manya Silvas, MD;  Location: [facility] ENDOSCOPY;  Service: Endoscopy;  Laterality: N/A;  . ESOPHAGOGASTRODUODENOSCOPY (EGD) WITH PROPOFOL N/A 08/02/2015   Procedure: ESOPHAGOGASTRODUODENOSCOPY (EGD) WITH PROPOFOL;  Surgeon: Manya Silvas, MD;  Location: [facility] ENDOSCOPY;  Service: Endoscopy;  Laterality: N/A;    There were no vitals filed for this visit.    Ther-Ex  Pulleys flex /abd for2.5 min eachwith cueing for slow movement and hold at the top  Lat pulldowns45#x 8, 2 setswithmin cuing for eccentric control  Standing Y against the Sherilyn Windhorst as a cue with RTB, cueing to keep motion slow and controlled,  alignment and squeeze shoulder blades, 3 sets of 10  90 deg ABD with ER (elbows at 90 deg), 3# DB bilaterally, 2 sets of 8  Cueing to perform motion slowly and visual demonstration required for proper technique  ABD Daequan Kozma slides with 3 second hold at the top, 2 sets of 15 cueing to hold at the top   Manual G3 inf GHJmobswithmovement intoflex/abd G3 post mobs with movement into ER/IR STM to bicep, pec  minor, and latissimus (pain to mild palpation)                               PT Short Term Goals - 04/19/20 1241      PT SHORT TERM GOAL #1   Title Pt will be independent with HEP in order to improve shoulder ROM in order to improve function of household ADLs    Time 4    Period Weeks    Status New             PT Long Term Goals - 04/19/20 1242      PT LONG TERM GOAL #1   Title Pt will decrease worst shoulder pain on NPRS in order to demonstrate clinically significant reduction in pain    Baseline 04/19/20 9/10    Time 8    Period Weeks    Status New      PT LONG TERM GOAL #2   Title Pt will demonstrate full AROM of the left shoulder in order to demonstrate full mobility to perform household ADLs and community activity    Baseline 04/19/20 Flex 140 abd 112  ER 60 IR 80    Time 8    Period Weeks    Status New      PT LONG TERM GOAL #3   Title Pt will demonstrate ability to place a 10# DB to a shelf overhead level to demonstrate increased strength and mobility to perform heavy household  ADLs    Baseline 04/19/20 eye level with no weight    Time 8    Period Weeks    Status New      PT LONG TERM GOAL #4   Title Pt will increase L shoulder flex/abd MMT to 5/5 to demonstrate increase in strength to perform heavy household ADLs and community activity.    Baseline 04/19/20 L shoulder Flex 4, Abd 4    Time 8    Period Weeks    Status New                  Patient will benefit from skilled therapeutic intervention in order to improve the following deficits and impairments:  Abnormal gait,Pain,Improper body mechanics,Postural dysfunction,Decreased activity tolerance,Decreased endurance,Decreased range of motion,Decreased strength,Decreased balance,Difficulty walking,Impaired flexibility,Hypomobility,Decreased mobility,Impaired UE functional use  Visit Diagnosis: Chronic left shoulder pain     Problem List Patient Active Problem List    Diagnosis Date Noted  . Lymphedema 01/17/2016  . Venous (peripheral) insufficiency 01/17/2016  . Pain in limb 01/17/2016  . Iron deficiency anemia due to chronic blood loss 01/09/2016  . History of endometrial cancer 08/01/2015  . Endometrial adenocarcinoma ([place]) 07/13/2015    Class: History of  . Inguinal lymphadenopathy 07/13/2015  . Long term current use of insulin ([place]) 12/12/2013  . Microalbuminuria 12/12/2013  . Diabetes ([place]) 07/28/2013  . BP (high blood pressure) 07/28/2013  . HLD (hyperlipidemia) 07/28/2013  . Adiposity 07/28/2013  . Obstructive apnea 07/28/2013  . Arthritis, degenerative 07/28/2013  . Apnea, sleep 07/28/2013  . Thyroid nodule 07/28/2013     Durwin Reges DPT [address] SPT  Durwin Reges 05/25/2020, 11:26 AM  [place] [facility] [address] [place], [address] Phone: [phone]   Fax:  [phone]  Name: Christine Lawson MRN: [id number] Date of Birth: [birth year]

## 2020-05-24 ENCOUNTER — Other Ambulatory Visit: Payer: Self-pay

## 2020-05-24 ENCOUNTER — Encounter: Payer: Medicare Other | Attending: Internal Medicine | Admitting: *Deleted

## 2020-05-24 ENCOUNTER — Encounter: Payer: Self-pay | Admitting: *Deleted

## 2020-05-24 VITALS — BP 128/70 | Ht 63.0 in | Wt 266.9 lb

## 2020-05-24 DIAGNOSIS — E1142 Type 2 diabetes mellitus with diabetic polyneuropathy: Secondary | ICD-10-CM | POA: Insufficient documentation

## 2020-05-24 DIAGNOSIS — Z6841 Body Mass Index (BMI) 40.0 and over, adult: Secondary | ICD-10-CM | POA: Diagnosis not present

## 2020-05-24 DIAGNOSIS — E1165 Type 2 diabetes mellitus with hyperglycemia: Secondary | ICD-10-CM

## 2020-05-24 DIAGNOSIS — Z794 Long term (current) use of insulin: Secondary | ICD-10-CM | POA: Insufficient documentation

## 2020-05-24 NOTE — Progress Notes (Signed)
Diabetes Self-Management Education  Visit Type: First/Initial  Appt. Start Time: 1410 Appt. End Time: 5188  05/24/2020  [name], identified by name and date of birth, is a 60 y.o. female with a diagnosis of Diabetes: Type 2.   ASSESSMENT  Blood pressure 128/70, height 5\' 3"  (1.6 m), weight 266 lb 14.4 oz (121.1 kg). Body mass index is 47.28 kg/m.   Diabetes Self-Management Education - 05/24/20 1636      Visit Information   Visit Type First/Initial      Initial Visit   Diabetes Type Type 2    Are you currently following a meal plan? No    Are you taking your medications as prescribed? Yes    Date Diagnosed 2001 or 2002      Health Coping   How would you rate your overall health? Fair      Psychosocial Assessment   Patient Belief/Attitude about Diabetes Other (comment)   "ok"   Self-care barriers Unsteady gait/risk for falls    Self-management support Doctor's office;Family    Other persons present Family Member   twin sister   Patient Concerns Nutrition/Meal planning;Weight Control    Special Needs None    Preferred Learning Style Auditory    Learning Readiness Ready    How often do you need to have someone help you when you read instructions, pamphlets, or other written materials from your doctor or pharmacy? 1 - Never    What is the last grade level you completed in school? 12th      Pre-Education Assessment   Patient understands the diabetes disease and treatment process. Needs Review    Patient understands incorporating nutritional management into lifestyle. Needs Review    Patient undertands incorporating physical activity into lifestyle. Needs Review    Patient understands using medications safely. Needs Review    Patient understands monitoring blood glucose, interpreting and using results Needs Review    Patient understands prevention, detection, and treatment of acute complications. Needs Review    Patient understands prevention, detection, and  treatment of chronic complications. Needs Review    Patient understands how to develop strategies to address psychosocial issues. Needs Review    Patient understands how to develop strategies to promote health/change behavior. Needs Review      Complications   Last HgB A1C per patient/outside source 8 %   04/12/2020   How often do you check your blood sugar? > 4 times/day    Fasting Blood glucose range (mg/dL) 130-179;180-200   pt reports FBG's 152-200 mg/dL   Postprandial Blood glucose range (mg/dL) --   pt reports pre-lunch 114-136 mg/dL, pre-supper 48-82 mg/dL and bedtime 99 mg/dL   Number of hypoglycemic episodes per month 3    Can you tell when your blood sugar is low? Yes    What do you do if your blood sugar is low? glucose tabs or ginger-ale then eat a snack or meal    Have you had a dilated eye exam in the past 12 months? Yes    Have you had a dental exam in the past 12 months? Yes    Are you checking your feet? Yes    How many days per week are you checking your feet? 7      Dietary Intake   Breakfast oatmeal and banana; pancakes and bacon    Snack (morning) peanut butter or cheese crackers    Lunch chicken salad sandwich; fruit (banana, apple, pear, grapes, strawberries); soup; crackers  Snack (afternoon) same as morning    Dinner ground [place], chicken, pork, potatoes, pinto beans, rice, pasta, broccoli, green beans, cabbage, greens, zucchini, carrots, squash    Snack (evening) same as morning    Beverage(s) water, fruit punch, Ginger ale; unsweetened tea, diet Ginger-ale      Exercise   Exercise Type Light (walking / raking leaves)    How many days per week to you exercise? 1    How many minutes per day do you exercise? 45    Total minutes per week of exercise 45      Patient Education   Previous Diabetes Education Yes (please comment)   here at diabetes clinic   Disease state  Definition of diabetes, type 1 and 2, and the diagnosis of diabetes;Explored patient's  options for treatment of their diabetes    Nutrition management  Role of diet in the treatment of diabetes and the relationship between the three main macronutrients and blood glucose level;Food label reading, portion sizes and measuring food.;Reviewed blood glucose goals for pre and post meals and how to evaluate the patients' food intake on their blood glucose level.    Physical activity and exercise  Role of exercise on diabetes management, blood pressure control and cardiac health.    Medications Taught/reviewed insulin injection, site rotation, insulin storage and needle disposal.;Reviewed patients medication for diabetes, action, purpose, timing of dose and side effects.    Monitoring Purpose and frequency of SMBG.;Taught/discussed recording of test results and interpretation of SMBG.;Identified appropriate SMBG and/or A1C goals.    Acute complications Taught treatment of hypoglycemia - the 15 rule.    Chronic complications Relationship between chronic complications and blood glucose control    Psychosocial adjustment Identified and addressed patients feelings and concerns about diabetes      Individualized Goals (developed by patient)   Reducing Risk Other (comment)   lose weight     Outcomes   Expected Outcomes Demonstrated interest in learning. Expect positive outcomes    Future DMSE 4-6 wks           Individualized Plan for Diabetes Self-Management Training:   Learning Objective:  Patient will have a greater understanding of diabetes self-management. Patient education plan is to attend individual and/or group sessions per assessed needs and concerns.   Plan:   Patient Instructions  Check blood sugars 4 x day before each meal and before bed every day Bring blood sugar records to the next appointment  Exercise: Continue walking for 30-60  minutes  1 day a week and increase as tolerated  Eat 3 meals day,   2  snacks a day Space meals 4-6 hours apart Avoid sugar sweetened  drinks (soda) unless treating a low blood sugar Complete 3 Day Food Record and bring to next appt  Carry fast acting glucose and a snack at all times Rotate injection sites Hold insulin pen in place for 5-10 seconds after injection  Return for appointment on:  Monday Jul 02, 2020 at 1:30 pm with Pam (dietitian)  Expected Outcomes:  Demonstrated interest in learning. Expect positive outcomes  Education material provided: General Meal Planning Guidelines Simple Meal Plan 3 Day Food Record  If problems or questions, patient to contact team via:  Johny Drilling, RN, [place] [phone]  Future DSME appointment: 4-6 wks  Jul 02, 2020 with the dietitian

## 2020-05-24 NOTE — Patient Instructions (Addendum)
Check blood sugars 4 x day before each meal and before bed every day Bring blood sugar records to the next appointment  Exercise: Continue walking for 30-60  minutes  1 day a week and increase as tolerated  Eat 3 meals day,   2  snacks a day Space meals 4-6 hours apart Avoid sugar sweetened drinks (soda) unless treating a low blood sugar Complete 3 Day Food Record and bring to next appt  Carry fast acting glucose and a snack at all times Rotate injection sites Hold insulin pen in place for 5-10 seconds after injection  Return for appointment on:  Monday Jul 02, 2020 at 1:30 pm with [facility] (dietitian)

## 2020-05-28 ENCOUNTER — Ambulatory Visit: Payer: Medicare Other

## 2020-05-28 ENCOUNTER — Other Ambulatory Visit: Payer: Self-pay

## 2020-05-28 DIAGNOSIS — G8929 Other chronic pain: Secondary | ICD-10-CM

## 2020-05-28 DIAGNOSIS — M25512 Pain in left shoulder: Secondary | ICD-10-CM | POA: Diagnosis not present

## 2020-05-28 DIAGNOSIS — M25612 Stiffness of left shoulder, not elsewhere classified: Secondary | ICD-10-CM

## 2020-05-28 NOTE — Therapy (Signed)
[facility] [address] [place], [address] Phone: [phone]   Fax:  [phone]  Physical Therapy Treatment Physical Therapy Progress Note   Dates of reporting period  04/19/2020 to 05/28/2020  Patient Details  Name: Christine Lawson MRN: [id number] Date of Birth: 12-31-1960 No data recorded  Encounter Date: 05/28/2020   PT End of Session - 05/28/20 1520    Visit Number 10    Number of Visits 17    Date for PT Re-Evaluation 06/14/20    Authorization - Visit Number 10    Authorization - Number of Visits 17    PT Start Time 1941    PT Stop Time 1515    PT Time Calculation (min) 40 min    Activity Tolerance Patient tolerated treatment well    Behavior During Therapy [facility] for tasks assessed/performed           Past Medical History:  Diagnosis Date  . Diabetes mellitus without complication ([place])   . Endometrial adenocarcinoma ([place])   . GERD (gastroesophageal reflux disease)   . Hematochezia   . Hyperlipidemia   . Hypertension   . IDA (iron deficiency anemia)   . Inguinal lymphadenopathy 07/13/2015  . Irritable bowel syndrome   . Obesity   . Osteoarthritis   . Sleep apnea     Past Surgical History:  Procedure Laterality Date  . ABDOMINAL HYSTERECTOMY  feb 2013  . COLONOSCOPY WITH PROPOFOL N/A 08/02/2015   Procedure: COLONOSCOPY WITH PROPOFOL;  Surgeon: Manya Silvas, MD;  Location: [facility] ENDOSCOPY;  Service: Endoscopy;  Laterality: N/A;  . ESOPHAGOGASTRODUODENOSCOPY (EGD) WITH PROPOFOL N/A 08/02/2015   Procedure: ESOPHAGOGASTRODUODENOSCOPY (EGD) WITH PROPOFOL;  Surgeon: Manya Silvas, MD;  Location: [facility] ENDOSCOPY;  Service: Endoscopy;  Laterality: N/A;    There were no vitals filed for this visit.   Subjective Assessment - 05/28/20 1439    Subjective Patient reports 6.5/10 on L and 5/10 on R. Patient reports HEP is going well with no increased symptoms.    Pertinent History Pt is a 60  y.o female with c/c of bilateral shoulder pain with more concerns for the left. Pain started a few months ago with no MOI. Pain is currently 7/10 Worst 9/10 Best 3/10. Pain is aggrevated with lifting overhead, exercises, pulling and pushing. Pain is eased with rest and medication. Pain sometimes wakes her up at night but is able to go back to sleep with position change. Pain is better in the morning and worse as day goes on. Pt does not work currently. Pt would like to get shoulder back to PLOF. Denies N/T down the arm, B/B changes, changes in weight, or night pains.    Limitations House hold activities;Lifting    How long can you sit comfortably? 15 min    How long can you stand comfortably? unlimited    How long can you walk comfortably? unlimited    Diagnostic tests none    Patient Stated Goals return shoulder to PLOF    Currently in Pain? Yes    Pain Score 6     Pain Location Shoulder    Pain Orientation Left    Pain Onset More than a month ago    Multiple Pain Sites No    Pain Onset 1 to 4 weeks ago         Ther-Ex   AROM in all directions at the shoulder  Shoulder MMT flexion on left shoulder = 4+, ABD =  4  Pulleys flex /abd for2.5 min eachwith cueing forslow movement and hold at the top  Seated DB overhead press 7# DB bilateral, 2 sets of 10   Seated shoulder ABD with 3# DB bilateral, 2 sets of 10    Manual 8 min  G3 infGHJmobswithmovement intoflex/abd, 1 min in each direction repeated twice  G3 post mobs with movement intoER 1 min, repeated twice  STM to pec minor, and latissimus  PT reviewed HEP and added seated Y with RTB 2-3x/day 2 sets of 10 reps in addition to previous HEP with dowel AAROM.          PT Education - 05/28/20 1521    Education provided Yes    Education Details therex form/ technique, patient progress with therapy    Person(s) Educated Patient    Methods Explanation;Demonstration;Tactile cues;Verbal cues    Comprehension Verbalized  understanding;Returned demonstration;Verbal cues required            PT Short Term Goals - 05/28/20 1522      PT SHORT TERM GOAL #1   Title Pt will be independent with HEP in order to improve shoulder ROM in order to improve function of household ADLs    Time 4    Period Weeks    Status Achieved             PT Long Term Goals - 05/28/20 1444      PT LONG TERM GOAL #1   Title Pt will decrease worst shoulder pain on NPRS in order to demonstrate clinically significant reduction in pain    Baseline 04/19/20 9/10, 05/28/2020 6.5/10    Status Achieved      PT LONG TERM GOAL #2   Title Pt will demonstrate full AROM of the left shoulder in order to demonstrate full mobility to perform household ADLs and community activity    Baseline 04/19/20 Flex 140 abd 112 ER 60 IR 80, 05/28/2020 = flex 145, abd 115, ER 70, IR 90    Status On-going      PT LONG TERM GOAL #3   Title Pt will demonstrate ability to place a 10# DB to a shelf overhead level to demonstrate increased strength and mobility to perform heavy household ADLs    Baseline 04/19/20 eye level with no weight, 05/28/2020 eye level with 10#    Time 8    Period Weeks    Status On-going      PT LONG TERM GOAL #4   Title Pt will increase L shoulder flex/abd MMT to 5/5 to demonstrate increase in strength to perform heavy household ADLs and community activity.    Baseline flex = 4+, ABD = 4    Time 8    Period Weeks    Status On-going              Plan - 05/28/20 1532    Clinical Impression Statement PT reasessed patient's goals to note progress. Patient demosntrated increased ROM and strength. Patient was also able to lift 10#DB overhead but lacked the motor conotrol to place it on a shelf above eye level.  PT continued therex progression for increase shoulder ROM and strength to continue progressing on goals. Patient demonstrated proper form/technique with moderate cueing. Patient will continue HEP with the addition of seated Y  exercise with RTB to address shoulder strength. Patient will continue to benefit from skilled PT intervention to continue progressing to fully acheive all goals. PT will continue therex progression to patient tolerance.  Personal Factors and Comorbidities Fitness;Comorbidity 1;Past/Current Experience    Examination-Activity Limitations Lift;Reach Overhead;Caring for Others;Carry;Bed Mobility    Stability/Clinical Decision Making Evolving/Moderate complexity    Clinical Decision Making Moderate    Rehab Potential Fair    Clinical Impairments Affecting Rehab Potential (-) transportation, multiple comorbidities (DM II, obesity, GERD, communication barrier, sedentary lifestyle (+) positive attitude,     PT Frequency 2x / week    PT Duration 8 weeks    PT Treatment/Interventions Neuromuscular re-education;Passive range of motion;Manual techniques;Patient/family education;Taping;Therapeutic exercise;Therapeutic activities;Functional mobility training;Moist Heat;Ultrasound;Cryotherapy;Electrical Stimulation;Dry needling;Balance training;Stair training;Gait training;ADLs/Self Care Home Management    PT Next Visit Plan review HEP, continue exercise progressions    PT Home Exercise Plan AAROM with dowel of shoulder flex, abd, ER, IR, seated Y with RTB    Consulted and Agree with Plan of Care Patient           Patient will benefit from skilled therapeutic intervention in order to improve the following deficits and impairments:  Abnormal gait,Pain,Improper body mechanics,Postural dysfunction,Decreased activity tolerance,Decreased endurance,Decreased range of motion,Decreased strength,Decreased balance,Difficulty walking,Impaired flexibility,Hypomobility,Decreased mobility,Impaired UE functional use  Visit Diagnosis: Chronic left shoulder pain  Stiffness of left shoulder, not elsewhere classified     Problem List Patient Active Problem List   Diagnosis Date Noted  . Lymphedema 01/17/2016  .  Venous (peripheral) insufficiency 01/17/2016  . Pain in limb 01/17/2016  . Iron deficiency anemia due to chronic blood loss 01/09/2016  . History of endometrial cancer 08/01/2015  . Endometrial adenocarcinoma ([place]) 07/13/2015    Class: History of  . Inguinal lymphadenopathy 07/13/2015  . Long term current use of insulin ([place]) 12/12/2013  . Microalbuminuria 12/12/2013  . Diabetes ([place]) 07/28/2013  . BP (high blood pressure) 07/28/2013  . HLD (hyperlipidemia) 07/28/2013  . Adiposity 07/28/2013  . Obstructive apnea 07/28/2013  . Arthritis, degenerative 07/28/2013  . Apnea, sleep 07/28/2013  . Thyroid nodule 07/28/2013   Andrey Campanile, SPT  Buccola,Allan C 05/28/2020, 5:23 PM  [facility] [address] [place], [address] Phone: [phone]   Fax:  [phone]  Name: Zafira Munos MRN: [id number] Date of Birth: [birth year]

## 2020-05-29 NOTE — Addendum Note (Signed)
Addended by: Kelton Pillar on: 05/29/2020 09:52 AM   Modules accepted: Orders

## 2020-05-30 ENCOUNTER — Ambulatory Visit: Payer: Medicare Other | Admitting: Physical Therapy

## 2020-05-30 ENCOUNTER — Other Ambulatory Visit: Payer: Self-pay

## 2020-05-30 ENCOUNTER — Encounter: Payer: Self-pay | Admitting: Physical Therapy

## 2020-05-30 DIAGNOSIS — M25512 Pain in left shoulder: Secondary | ICD-10-CM

## 2020-05-30 DIAGNOSIS — G8929 Other chronic pain: Secondary | ICD-10-CM

## 2020-05-30 DIAGNOSIS — M25612 Stiffness of left shoulder, not elsewhere classified: Secondary | ICD-10-CM

## 2020-05-30 NOTE — Therapy (Addendum)
[facility] [address], [place], [address] Phone: [phone]   Fax:  [phone]  Physical Therapy Treatment  Patient Details  Name: Christine Lawson MRN: [id number] Date of Birth: [birth year] No data recorded  Encounter Date: 05/30/2020   PT End of Session - 05/30/20 1519    Visit Number 11    Number of Visits 17    Date for PT Re-Evaluation 06/14/20    Authorization - Visit Number 11    Authorization - Number of Visits 17    PT Start Time 0093    PT Stop Time 1515    PT Time Calculation (min) 42 min    Activity Tolerance Patient tolerated treatment well    Behavior During Therapy [facility] for tasks assessed/performed           Past Medical History:  Diagnosis Date  . Diabetes mellitus without complication ([place])   . Endometrial adenocarcinoma ([place])   . GERD (gastroesophageal reflux disease)   . Hematochezia   . Hyperlipidemia   . Hypertension   . IDA (iron deficiency anemia)   . Inguinal lymphadenopathy 07/13/2015  . Irritable bowel syndrome   . Obesity   . Osteoarthritis   . Sleep apnea     Past Surgical History:  Procedure Laterality Date  . ABDOMINAL HYSTERECTOMY  feb 2013  . COLONOSCOPY WITH PROPOFOL N/A 08/02/2015   Procedure: COLONOSCOPY WITH PROPOFOL;  Surgeon: Manya Silvas, MD;  Location: [facility] ENDOSCOPY;  Service: Endoscopy;  Laterality: N/A;  . ESOPHAGOGASTRODUODENOSCOPY (EGD) WITH PROPOFOL N/A 08/02/2015   Procedure: ESOPHAGOGASTRODUODENOSCOPY (EGD) WITH PROPOFOL;  Surgeon: Manya Silvas, MD;  Location: [facility] ENDOSCOPY;  Service: Endoscopy;  Laterality: N/A;    There were no vitals filed for this visit.   Subjective Assessment - 05/30/20 1435    Subjective Patient reports 6.5/10 on L and 7/10 on R today. Increased pain on R due to hitting it on a cabinet. Patient reports HEP is going well with no increased symptoms, going to add new exercise from previous session this weekend.     Pertinent History Pt is a 60 y.o female with c/c of bilateral shoulder pain with more concerns for the left. Pain started a few months ago with no MOI. Pain is currently 7/10 Worst 9/10 Best 3/10. Pain is aggrevated with lifting overhead, exercises, pulling and pushing. Pain is eased with rest and medication. Pain sometimes wakes her up at night but is able to go back to sleep with position change. Pain is better in the morning and worse as day goes on. Pt does not work currently. Pt would like to get shoulder back to PLOF. Denies N/T down the arm, B/B changes, changes in weight, or night pains.    Limitations House hold activities;Lifting    How long can you sit comfortably? 15 min    How long can you stand comfortably? unlimited    How long can you walk comfortably? unlimited    Diagnostic tests none    Patient Stated Goals return shoulder to PLOF    Currently in Pain? Yes    Pain Score 6     Pain Location Shoulder    Pain Orientation Left;Right    Pain Descriptors / Indicators Dull    Pain Type Chronic pain           Ther-Ex  Pulleys flex /abd for2.5 min eachwith cueing forslow movement and hold at the top  Lat pulldowns45#x6, 2 sets, 1 set  of 8 with 35#: cueing for eccentric control and wider hands   ABD Eric Morganti slides with 2# AW on left wrist  with 3 second hold at the top, 3 sets of 8 cueing to hold at the top   Seated Y with RTB, cueing to keep motion slow and controlled, hold head up and sit up tall,  alignment and squeeze shoulder blades, 2 sets of 10   Seated ER with RTB, cueing to keep elbow at sides, 2 sets of 10   Manual G3 infGHJmobswithmovement intoflex/abd G3 post mobs with movement intoER STM to bicep, pec minor, and latissimus, especially TTP latissimus       PT Education - 05/30/20 1518    Education provided Yes    Education Details therex form/technique    Person(s) Educated Patient    Methods Explanation;Demonstration;Tactile cues;Verbal  cues    Comprehension Verbalized understanding;Returned demonstration;Verbal cues required            PT Short Term Goals - 05/28/20 1522      PT SHORT TERM GOAL #1   Title Pt will be independent with HEP in order to improve shoulder ROM in order to improve function of household ADLs    Time 4    Period Weeks    Status Achieved             PT Long Term Goals - 05/28/20 1444      PT LONG TERM GOAL #1   Title Pt will decrease worst shoulder pain on NPRS in order to demonstrate clinically significant reduction in pain    Baseline 04/19/20 9/10, 05/28/2020 6.5/10    Status Achieved      PT LONG TERM GOAL #2   Title Pt will demonstrate full AROM of the left shoulder in order to demonstrate full mobility to perform household ADLs and community activity    Baseline 04/19/20 Flex 140 abd 112 ER 60 IR 80, 05/28/2020 = flex 145, abd 115, ER 70, IR 90    Status On-going      PT LONG TERM GOAL #3   Title Pt will demonstrate ability to place a 10# DB to a shelf overhead level to demonstrate increased strength and mobility to perform heavy household ADLs    Baseline 04/19/20 eye level with no weight, 05/28/2020 eye level with 10#    Time 8    Period Weeks    Status On-going      PT LONG TERM GOAL #4   Title Pt will increase L shoulder flex/abd MMT to 5/5 to demonstrate increase in strength to perform heavy household ADLs and community activity.    Baseline flex = 4+, ABD = 4    Time 8    Period Weeks    Status On-going                 Plan - 05/30/20 1520    Clinical Impression Statement PT continued therex progressions for UE strength and mobility of the L shoulder. Patient tolerated all exercises and mobilizations well and demonstrated proper technique with minimal cueing. Patient motivated to continue HEP at home. PT will continue to progress exercises to patient tolerance.    Personal Factors and Comorbidities Fitness;Comorbidity 1;Past/Current Experience     Examination-Activity Limitations Lift;Reach Overhead;Caring for Others;Carry;Bed Mobility    Examination-Participation Restrictions Community Activity;Cleaning;Laundry    Stability/Clinical Decision Making Evolving/Moderate complexity    Clinical Decision Making Moderate    Rehab Potential Fair    Clinical Impairments Affecting Rehab Potential (-)  transportation, multiple comorbidities (DM II, obesity, GERD, communication barrier, sedentary lifestyle (+) positive attitude,     PT Frequency 2x / week    PT Duration 8 weeks    PT Treatment/Interventions Neuromuscular re-education;Passive range of motion;Manual techniques;Patient/family education;Taping;Therapeutic exercise;Therapeutic activities;Functional mobility training;Moist Heat;Ultrasound;Cryotherapy;Electrical Stimulation;Dry needling;Balance training;Stair training;Gait training;ADLs/Self Care Home Management    PT Next Visit Plan review HEP, continue exercise progressions    PT Home Exercise Plan AAROM with dowel of shoulder flex, abd, ER, IR, seated Y with RTB    Consulted and Agree with Plan of Care Patient           Patient will benefit from skilled therapeutic intervention in order to improve the following deficits and impairments:  Abnormal gait,Pain,Improper body mechanics,Postural dysfunction,Decreased activity tolerance,Decreased endurance,Decreased range of motion,Decreased strength,Decreased balance,Difficulty walking,Impaired flexibility,Hypomobility,Decreased mobility,Impaired UE functional use  Visit Diagnosis: Chronic left shoulder pain  Stiffness of left shoulder, not elsewhere classified     Problem List Patient Active Problem List   Diagnosis Date Noted  . Lymphedema 01/17/2016  . Venous (peripheral) insufficiency 01/17/2016  . Pain in limb 01/17/2016  . Iron deficiency anemia due to chronic blood loss 01/09/2016  . History of endometrial cancer 08/01/2015  . Endometrial adenocarcinoma ([place]) 07/13/2015     Class: History of  . Inguinal lymphadenopathy 07/13/2015  . Long term current use of insulin ([place]) 12/12/2013  . Microalbuminuria 12/12/2013  . Diabetes ([place]) 07/28/2013  . BP (high blood pressure) 07/28/2013  . HLD (hyperlipidemia) 07/28/2013  . Adiposity 07/28/2013  . Obstructive apnea 07/28/2013  . Arthritis, degenerative 07/28/2013  . Apnea, sleep 07/28/2013  . Thyroid nodule 07/28/2013    Durwin Reges DPT  [address], SPT  Durwin Reges 05/31/2020, 2:49 PM  [facility] [facility] [address], [place], [address] Phone: [phone]   Fax:  [phone]  Name: Christine Lawson MRN: [id number] Date of Birth: [birth year]

## 2020-06-04 ENCOUNTER — Ambulatory Visit: Payer: Medicare Other | Admitting: Physical Therapy

## 2020-06-06 ENCOUNTER — Ambulatory Visit: Payer: Medicare Other | Attending: Internal Medicine | Admitting: Physical Therapy

## 2020-06-06 ENCOUNTER — Other Ambulatory Visit: Payer: Self-pay

## 2020-06-06 ENCOUNTER — Encounter: Payer: Self-pay | Admitting: Physical Therapy

## 2020-06-06 DIAGNOSIS — M25612 Stiffness of left shoulder, not elsewhere classified: Secondary | ICD-10-CM | POA: Diagnosis present

## 2020-06-06 DIAGNOSIS — M25512 Pain in left shoulder: Secondary | ICD-10-CM | POA: Diagnosis present

## 2020-06-06 DIAGNOSIS — G8929 Other chronic pain: Secondary | ICD-10-CM | POA: Diagnosis present

## 2020-06-06 NOTE — Therapy (Signed)
[place] [address], [place], [address] Phone: [phone]   Fax:  [phone]  Physical Therapy Treatment  Patient Details  Name: Christine Lawson MRN: [id number] Date of Birth: 1960/05/19 No data recorded  Encounter Date: 06/06/2020   PT End of Session - 06/06/20 1433    Visit Number 12    Number of Visits 17    Date for PT Re-Evaluation 06/14/20    Authorization - Visit Number 12    Authorization - Number of Visits 17    PT Start Time 8921    PT Stop Time 1430    PT Time Calculation (min) 45 min    Activity Tolerance Patient tolerated treatment well    Behavior During Therapy [facility] for tasks assessed/performed           Past Medical History:  Diagnosis Date  . Diabetes mellitus without complication ([place])   . Endometrial adenocarcinoma ([place])   . GERD (gastroesophageal reflux disease)   . Hematochezia   . Hyperlipidemia   . Hypertension   . IDA (iron deficiency anemia)   . Inguinal lymphadenopathy 07/13/2015  . Irritable bowel syndrome   . Obesity   . Osteoarthritis   . Sleep apnea     Past Surgical History:  Procedure Laterality Date  . ABDOMINAL HYSTERECTOMY  feb 2013  . COLONOSCOPY WITH PROPOFOL N/A 08/02/2015   Procedure: COLONOSCOPY WITH PROPOFOL;  Surgeon: Manya Silvas, MD;  Location: [facility] ENDOSCOPY;  Service: Endoscopy;  Laterality: N/A;  . ESOPHAGOGASTRODUODENOSCOPY (EGD) WITH PROPOFOL N/A 08/02/2015   Procedure: ESOPHAGOGASTRODUODENOSCOPY (EGD) WITH PROPOFOL;  Surgeon: Manya Silvas, MD;  Location: [facility] ENDOSCOPY;  Service: Endoscopy;  Laterality: N/A;    There were no vitals filed for this visit.   Subjective Assessment - 06/06/20 1348    Subjective Patient reports 6.5/10 on L and 5.5/10 on R today. Patient reports HEP is going well with no increased symptoms.    Pertinent History Pt is a 60 y.o female with c/c of bilateral shoulder pain with more concerns for the left.  Pain started a few months ago with no MOI. Pain is currently 7/10 Worst 9/10 Best 3/10. Pain is aggrevated with lifting overhead, exercises, pulling and pushing. Pain is eased with rest and medication. Pain sometimes wakes her up at night but is able to go back to sleep with position change. Pain is better in the morning and worse as day goes on. Pt does not work currently. Pt would like to get shoulder back to PLOF. Denies N/T down the arm, B/B changes, changes in weight, or night pains.    Limitations House hold activities;Lifting    How long can you sit comfortably? 15 min    How long can you stand comfortably? unlimited    How long can you walk comfortably? unlimited    Diagnostic tests none    Patient Stated Goals return shoulder to PLOF    Pain Score 6     Pain Location Shoulder          Therex:  Pulleys flex /abd for2.34min eachwith cueing forslow movement and hold at the top  Arm circles with 2 kg med bal, 3 sets of 20 circles, cueing to move in slow circles   Seated Y with 9# DB bilaterally, 1 set of 8, 7# DB bilaterally 2 sets 8, cueing to control weights on the decent   Standing ER against Havah Ammon for cue to keep elbow against Bucky Grigg, 5#  DB bilaterally, 3 sets of 8  Lat pulldowns45#x8,7, 6, cueing for eccentric control and wider hands   Manual G3 infGHJmobswithmovement intoabd G3 post mobs with movement intoER./IR STM to pec minor, and latissimus     PT Education - 06/06/20 1433    Education provided Yes    Education Details therex form/technique    Methods Explanation;Demonstration;Tactile cues;Verbal cues    Comprehension Returned demonstration;Verbal cues required;Tactile cues required;Verbalized understanding            PT Short Term Goals - 05/28/20 1522      PT SHORT TERM GOAL #1   Title Pt will be independent with HEP in order to improve shoulder ROM in order to improve function of household ADLs    Time 4    Period Weeks    Status  Achieved             PT Long Term Goals - 05/28/20 1444      PT LONG TERM GOAL #1   Title Pt will decrease worst shoulder pain on NPRS in order to demonstrate clinically significant reduction in pain    Baseline 04/19/20 9/10, 05/28/2020 6.5/10    Status Achieved      PT LONG TERM GOAL #2   Title Pt will demonstrate full AROM of the left shoulder in order to demonstrate full mobility to perform household ADLs and community activity    Baseline 04/19/20 Flex 140 abd 112 ER 60 IR 80, 05/28/2020 = flex 145, abd 115, ER 70, IR 90    Status On-going      PT LONG TERM GOAL #3   Title Pt will demonstrate ability to place a 10# DB to a shelf overhead level to demonstrate increased strength and mobility to perform heavy household ADLs    Baseline 04/19/20 eye level with no weight, 05/28/2020 eye level with 10#    Time 8    Period Weeks    Status On-going      PT LONG TERM GOAL #4   Title Pt will increase L shoulder flex/abd MMT to 5/5 to demonstrate increase in strength to perform heavy household ADLs and community activity.    Baseline flex = 4+, ABD = 4    Time 8    Period Weeks    Status On-going                 Plan - 06/06/20 1434    Clinical Impression Statement PT continued therex progressions for UE strength and mobility of the L shoulder. Patient is demonstrated increased strength and ROM and tolerates all exercises with no increase in symptoms. Patient tolerates manual therapy to continue increase mobility of the shoulder. PT will continue to progress exercises to patient tolerance.    Personal Factors and Comorbidities Fitness;Comorbidity 1;Past/Current Experience    Examination-Activity Limitations Lift;Reach Overhead;Caring for Others;Carry;Bed Mobility    Examination-Participation Restrictions Community Activity;Cleaning;Laundry    Stability/Clinical Decision Making Evolving/Moderate complexity    Clinical Decision Making Moderate    Rehab Potential Fair    Clinical  Impairments Affecting Rehab Potential (-) transportation, multiple comorbidities (DM II, obesity, GERD, communication barrier, sedentary lifestyle (+) positive attitude,     PT Frequency 2x / week    PT Duration 8 weeks    PT Treatment/Interventions Neuromuscular re-education;Passive range of motion;Manual techniques;Patient/family education;Taping;Therapeutic exercise;Therapeutic activities;Functional mobility training;Moist Heat;Ultrasound;Cryotherapy;Electrical Stimulation;Dry needling;Balance training;Stair training;Gait training;ADLs/Self Care Home Management    PT Next Visit Plan continue exercise progressions    PT Home Exercise Plan  AAROM with dowel of shoulder flex, abd, ER, IR, seated Y with RTB    Consulted and Agree with Plan of Care Patient           Patient will benefit from skilled therapeutic intervention in order to improve the following deficits and impairments:  Abnormal gait,Pain,Improper body mechanics,Postural dysfunction,Decreased activity tolerance,Decreased endurance,Decreased range of motion,Decreased strength,Decreased balance,Difficulty walking,Impaired flexibility,Hypomobility,Decreased mobility,Impaired UE functional use  Visit Diagnosis: Chronic left shoulder pain  Stiffness of left shoulder, not elsewhere classified     Problem List Patient Active Problem List   Diagnosis Date Noted  . Lymphedema 01/17/2016  . Venous (peripheral) insufficiency 01/17/2016  . Pain in limb 01/17/2016  . Iron deficiency anemia due to chronic blood loss 01/09/2016  . History of endometrial cancer 08/01/2015  . Endometrial adenocarcinoma ([place]) 07/13/2015    Class: History of  . Inguinal lymphadenopathy 07/13/2015  . Long term current use of insulin ([place]) 12/12/2013  . Microalbuminuria 12/12/2013  . Diabetes ([place]) 07/28/2013  . BP (high blood pressure) 07/28/2013  . HLD (hyperlipidemia) 07/28/2013  . Adiposity 07/28/2013  . Obstructive apnea 07/28/2013  . Arthritis,  degenerative 07/28/2013  . Apnea, sleep 07/28/2013  . Thyroid nodule 07/28/2013     Durwin Reges DPT [address] SPT  Emily Massar 06/06/2020, 2:36 PM  [place] [facility] [address], [place], [address] Phone: [phone]   Fax:  [phone]  Name: Tiziana Cislo MRN: [id number] Date of Birth: March 09, 1960

## 2020-06-11 ENCOUNTER — Other Ambulatory Visit: Payer: Self-pay

## 2020-06-11 ENCOUNTER — Ambulatory Visit: Payer: Medicare Other | Admitting: Physical Therapy

## 2020-06-11 DIAGNOSIS — G8929 Other chronic pain: Secondary | ICD-10-CM

## 2020-06-11 DIAGNOSIS — M25612 Stiffness of left shoulder, not elsewhere classified: Secondary | ICD-10-CM

## 2020-06-11 DIAGNOSIS — M25512 Pain in left shoulder: Secondary | ICD-10-CM | POA: Diagnosis not present

## 2020-06-11 NOTE — Therapy (Signed)
[place] [facility] [address], [place], [address] Phone: [phone]   Fax:  [phone]  Physical Therapy Treatment  Patient Details  Name: Christine Lawson MRN: [id number] Date of Birth: [birth year] No data recorded  Encounter Date: 06/11/2020   PT End of Session - 06/11/20 1431    Visit Number 13    Number of Visits 17    Date for PT Re-Evaluation 06/14/20    Authorization - Visit Number 13    Authorization - Number of Visits 17    PT Start Time 0142    PT Stop Time 0227    PT Time Calculation (min) 45 min    Activity Tolerance Patient tolerated treatment well    Behavior During Therapy [facility] for tasks assessed/performed           Past Medical History:  Diagnosis Date  . Diabetes mellitus without complication ([place])   . Endometrial adenocarcinoma ([place])   . GERD (gastroesophageal reflux disease)   . Hematochezia   . Hyperlipidemia   . Hypertension   . IDA (iron deficiency anemia)   . Inguinal lymphadenopathy 07/13/2015  . Irritable bowel syndrome   . Obesity   . Osteoarthritis   . Sleep apnea     Past Surgical History:  Procedure Laterality Date  . ABDOMINAL HYSTERECTOMY  feb 2013  . COLONOSCOPY WITH PROPOFOL N/A 08/02/2015   Procedure: COLONOSCOPY WITH PROPOFOL;  Surgeon: Manya Silvas, MD;  Location: [facility] ENDOSCOPY;  Service: Endoscopy;  Laterality: N/A;  . ESOPHAGOGASTRODUODENOSCOPY (EGD) WITH PROPOFOL N/A 08/02/2015   Procedure: ESOPHAGOGASTRODUODENOSCOPY (EGD) WITH PROPOFOL;  Surgeon: Manya Silvas, MD;  Location: [facility] ENDOSCOPY;  Service: Endoscopy;  Laterality: N/A;    There were no vitals filed for this visit.   Subjective Assessment - 06/11/20 1343    Subjective Patient reports 6/10 on L and 5/10 on R today. Patient reports HEP is going well with no increased symptoms. Patient reports she has been using her new DB to exercise at home.    Pertinent History Pt is a 60 y.o female with  c/c of bilateral shoulder pain with more concerns for the left. Pain started a few months ago with no MOI. Pain is currently 7/10 Worst 9/10 Best 3/10. Pain is aggrevated with lifting overhead, exercises, pulling and pushing. Pain is eased with rest and medication. Pain sometimes wakes her up at night but is able to go back to sleep with position change. Pain is better in the morning and worse as day goes on. Pt does not work currently. Pt would like to get shoulder back to PLOF. Denies N/T down the arm, B/B changes, changes in weight, or night pains.    Limitations House hold activities;Lifting    How long can you sit comfortably? 15 min    How long can you stand comfortably? unlimited    How long can you walk comfortably? unlimited    Diagnostic tests none    Patient Stated Goals return shoulder to PLOF    Currently in Pain? Yes    Pain Score 6     Pain Location Shoulder    Pain Orientation Right;Left    Pain Onset More than a month ago    Pain Frequency Constant          Therex:  Pulleys flex /abd for2.18min eachwith cueing forgood hold at the top and avoid moving trunk with motion  Standing ER against ball with 2kg med ball, cueing to keep  elbows at side throughout motion, 3 sets of 10   OMEGA chest press 25#, 3 sets of 8, cueing to control eccentric motion and keep shoulders back throughout motion   Lat pulldowns45#, 3x8, cueing for eccentric control and wider hands   Overhead press with one 10# DB, 3 sets of 10, cueing to straighten elbows and control motion throughout   Standing ABD with 5# DB, 3 sets of 8, attempted with 6# but needed to decrease resistance to allow for proper form, cueing to keep elbows straight and thumbs up, cueing for eccentric control   Manual STM to pec minor and latissimus to decrease muscle tension     PT Short Term Goals - 05/28/20 1522      PT SHORT TERM GOAL #1   Title Pt will be independent with HEP in order to improve shoulder ROM  in order to improve function of household ADLs    Time 4    Period Weeks    Status Achieved             PT Long Term Goals - 05/28/20 1444      PT LONG TERM GOAL #1   Title Pt will decrease worst shoulder pain on NPRS in order to demonstrate clinically significant reduction in pain    Baseline 04/19/20 9/10, 05/28/2020 6.5/10    Status Achieved      PT LONG TERM GOAL #2   Title Pt will demonstrate full AROM of the left shoulder in order to demonstrate full mobility to perform household ADLs and community activity    Baseline 04/19/20 Flex 140 abd 112 ER 60 IR 80, 05/28/2020 = flex 145, abd 115, ER 70, IR 90    Status On-going      PT LONG TERM GOAL #3   Title Pt will demonstrate ability to place a 10# DB to a shelf overhead level to demonstrate increased strength and mobility to perform heavy household ADLs    Baseline 04/19/20 eye level with no weight, 05/28/2020 eye level with 10#    Time 8    Period Weeks    Status On-going      PT LONG TERM GOAL #4   Title Pt will increase L shoulder flex/abd MMT to 5/5 to demonstrate increase in strength to perform heavy household ADLs and community activity.    Baseline flex = 4+, ABD = 4    Time 8    Period Weeks    Status On-going                 Plan - 06/11/20 1510    Clinical Impression Statement PT continued therex progressions for UE strength of the L shoulder. Patient continues to demonstrate increased strength throughout all motions and tolerates all exercises with no increase in symptoms. Patient responds to all cues to correct therex form. PT will continue to progress exercises to patient tolerance.    Personal Factors and Comorbidities Fitness;Comorbidity 1;Past/Current Experience    Examination-Activity Limitations Lift;Reach Overhead;Caring for Others;Carry;Bed Mobility    Examination-Participation Restrictions Community Activity;Cleaning;Laundry    Stability/Clinical Decision Making Evolving/Moderate complexity     Rehab Potential Fair    Clinical Impairments Affecting Rehab Potential (-) transportation, multiple comorbidities (DM II, obesity, GERD, communication barrier, sedentary lifestyle (+) positive attitude,     PT Frequency 2x / week    PT Duration 8 weeks    PT Treatment/Interventions Neuromuscular re-education;Passive range of motion;Manual techniques;Patient/family education;Taping;Therapeutic exercise;Therapeutic activities;Functional mobility training;Moist Heat;Ultrasound;Cryotherapy;Electrical Stimulation;Dry needling;Balance training;Stair training;Gait training;ADLs/Self  Care Home Management    PT Next Visit Plan continue exercise progressions    PT Home Exercise Plan AAROM with dowel of shoulder flex, abd, ER, IR, seated Y with RTB    Consulted and Agree with Plan of Care Patient           Patient will benefit from skilled therapeutic intervention in order to improve the following deficits and impairments:  Abnormal gait,Pain,Improper body mechanics,Postural dysfunction,Decreased activity tolerance,Decreased endurance,Decreased range of motion,Decreased strength,Decreased balance,Difficulty walking,Impaired flexibility,Hypomobility,Decreased mobility,Impaired UE functional use  Visit Diagnosis: Chronic left shoulder pain  Stiffness of left shoulder, not elsewhere classified     Problem List Patient Active Problem List   Diagnosis Date Noted  . Lymphedema 01/17/2016  . Venous (peripheral) insufficiency 01/17/2016  . Pain in limb 01/17/2016  . Iron deficiency anemia due to chronic blood loss 01/09/2016  . History of endometrial cancer 08/01/2015  . Endometrial adenocarcinoma ([place]) 07/13/2015    Class: History of  . Inguinal lymphadenopathy 07/13/2015  . Long term current use of insulin ([place]) 12/12/2013  . Microalbuminuria 12/12/2013  . Diabetes ([place]) 07/28/2013  . BP (high blood pressure) 07/28/2013  . HLD (hyperlipidemia) 07/28/2013  . Adiposity 07/28/2013  . Obstructive  apnea 07/28/2013  . Arthritis, degenerative 07/28/2013  . Apnea, sleep 07/28/2013  . Thyroid nodule 07/28/2013   Andrey Campanile, SPT  Christine Lawson 06/11/2020, 3:22 PM  [place] [facility] [address], [place], [address] Phone: [phone]   Fax:  [phone]  Name: Christine Lawson MRN: [id number] Date of Birth: May 09, 1960

## 2020-06-13 ENCOUNTER — Other Ambulatory Visit: Payer: Self-pay

## 2020-06-13 ENCOUNTER — Ambulatory Visit: Payer: Medicare Other | Admitting: Physical Therapy

## 2020-06-13 ENCOUNTER — Encounter: Payer: Self-pay | Admitting: Physical Therapy

## 2020-06-13 DIAGNOSIS — G8929 Other chronic pain: Secondary | ICD-10-CM

## 2020-06-13 DIAGNOSIS — M25612 Stiffness of left shoulder, not elsewhere classified: Secondary | ICD-10-CM

## 2020-06-13 DIAGNOSIS — M25512 Pain in left shoulder: Secondary | ICD-10-CM | POA: Diagnosis not present

## 2020-06-13 NOTE — Therapy (Signed)
[place] [facility] [address] [place], [address] Phone: [phone]   Fax:  [phone]  Physical Therapy Treatment/ Discharge Summary  Reporting Period 05/28/20 to 06/13/20  Patient Details  Name: Christine Lawson MRN: [id number] Date of Birth: Jul 25, 1960 No data recorded  Encounter Date: 06/13/2020   PT End of Session - 06/13/20 1413    Visit Number 14    Number of Visits 17    Date for PT Re-Evaluation 06/14/20    Authorization - Visit Number 13    Authorization - Number of Visits 17    PT Start Time 2637    PT Stop Time 1408    PT Time Calculation (min) 30 min    Activity Tolerance Patient tolerated treatment well    Behavior During Therapy [facility] for tasks assessed/performed           Past Medical History:  Diagnosis Date  . Diabetes mellitus without complication ([place])   . Endometrial adenocarcinoma ([place])   . GERD (gastroesophageal reflux disease)   . Hematochezia   . Hyperlipidemia   . Hypertension   . IDA (iron deficiency anemia)   . Inguinal lymphadenopathy 07/13/2015  . Irritable bowel syndrome   . Obesity   . Osteoarthritis   . Sleep apnea     Past Surgical History:  Procedure Laterality Date  . ABDOMINAL HYSTERECTOMY  feb 2013  . COLONOSCOPY WITH PROPOFOL N/A 08/02/2015   Procedure: COLONOSCOPY WITH PROPOFOL;  Surgeon: Manya Silvas, MD;  Location: [facility] ENDOSCOPY;  Service: Endoscopy;  Laterality: N/A;  . ESOPHAGOGASTRODUODENOSCOPY (EGD) WITH PROPOFOL N/A 08/02/2015   Procedure: ESOPHAGOGASTRODUODENOSCOPY (EGD) WITH PROPOFOL;  Surgeon: Manya Silvas, MD;  Location: [facility] ENDOSCOPY;  Service: Endoscopy;  Laterality: N/A;    There were no vitals filed for this visit.   Subjective Assessment - 06/13/20 1339    Subjective Patient reports 5/10 pain in both shoulders. Patient reports her HEP is going well with no increase in symptoms.    Pertinent History Pt is a 60 y.o female with c/c of  bilateral shoulder pain with more concerns for the left. Pain started a few months ago with no MOI. Pain is currently 7/10 Worst 9/10 Best 3/10. Pain is aggrevated with lifting overhead, exercises, pulling and pushing. Pain is eased with rest and medication. Pain sometimes wakes her up at night but is able to go back to sleep with position change. Pain is better in the morning and worse as day goes on. Pt does not work currently. Pt would like to get shoulder back to PLOF. Denies N/T down the arm, B/B changes, changes in weight, or night pains.    How long can you sit comfortably? 15 min    How long can you stand comfortably? unlimited    How long can you walk comfortably? unlimited    Diagnostic tests none    Patient Stated Goals return shoulder to PLOF    Currently in Pain? Yes    Pain Score 5     Pain Location Shoulder    Pain Orientation Right;Left          Treatment:  pulleys 2.5 minutes of flexion, 2.5 minutes of ABD  AROM LUE:  Flexion= R= 145, L= 145  ABD= R= 130, L= 130  ER= L=75 IR= L=90  10# DB overhead - able to complete without difficulty  FOTO = 83   Shoulder flexion MMT 5/5 Shoulder ABD MMT 5/5  Seated Y with GTB  1 set of 10  Seated flexion with GTB 1 set of 10  Seated ABD with GTB 1 set of 10  Overhead press 8# DB bilaterally   Patient educated on HEP: Seated Overhead DB press, Seated Diagonal Pulls with GTB, Seated shoulder W with scaption with GTB    PT Education - 06/13/20 1414    Education provided Yes    Education Details therex form/ technique, patient's progress    Person(s) Educated Patient    Methods Explanation;Demonstration;Verbal cues;Tactile cues    Comprehension Verbalized understanding;Verbal cues required;Returned demonstration            PT Short Term Goals - 06/13/20 1415      PT SHORT TERM GOAL #1   Title Pt will be independent with HEP in order to improve shoulder ROM in order to improve function of household ADLs    Time 4     Period Weeks    Status Achieved             PT Long Term Goals - 06/13/20 1344      PT LONG TERM GOAL #1   Title Pt will decrease worst shoulder pain on NPRS in order to demonstrate clinically significant reduction in pain    Baseline 04/19/20 9/10, 05/28/2020 6.5/10    Time 8    Period Weeks    Status Achieved      PT LONG TERM GOAL #2   Title Pt will demonstrate full AROM of the left shoulder in order to demonstrate full mobility to perform household ADLs and community activity    Baseline 04/19/20 Flex 140 abd 112 ER 60 IR 80, 05/28/2020 = flex 145, abd 115, ER 70, IR 90, 06/13/20= lexion= R= 145, L= 145   ABD= R= 130, L= 130   ER= L=75  IR= L=90    Time 8    Period Weeks    Status Achieved      PT LONG TERM GOAL #3   Title Pt will demonstrate ability to place a 10# DB to a shelf overhead level to demonstrate increased strength and mobility to perform heavy household ADLs    Baseline 04/19/20 eye level with no weight, 05/28/2020 eye level with 10#,  06/13/20 overhead with 10# DB    Time 8    Period Weeks    Status Achieved      PT LONG TERM GOAL #4   Title Pt will increase L shoulder flex/abd MMT to 5/5 to demonstrate increase in strength to perform heavy household ADLs and community activity.    Baseline flex = 4+, ABD = 4, 06/13/20 flexion = 5/5, ABD = 5/5    Time 8    Period Weeks    Status Achieved                 Plan - 06/13/20 1410    Clinical Impression Statement PT reassessed patient's progress toward goals by evaluating shoulder strength and ROM. Patient demonstrated symmetrical shoulder ROM when compared to RUE and full shoulder strength with flexion and ABD. Patient demonstrates the necessary ROM and strength to complete functional activities such as lifting overhead. Patient was educated on an HEP to maintain strength and shoulder mobility and demonstrated full indepence with all exercises. Patient is able to discharge from PT having met all goals and returned  to PLOF.    Personal Factors and Comorbidities Fitness;Comorbidity 1;Past/Current Experience    Examination-Activity Limitations Lift;Reach Overhead;Caring for Others;Carry;Bed Mobility    Examination-Participation Restrictions Community  Activity;Cleaning;Laundry    Stability/Clinical Decision Making Evolving/Moderate complexity    Clinical Decision Making Moderate    Rehab Potential Fair    Clinical Impairments Affecting Rehab Potential (-) transportation, multiple comorbidities (DM II, obesity, GERD, communication barrier, sedentary lifestyle (+) positive attitude,     PT Frequency 2x / week    PT Duration 8 weeks    PT Treatment/Interventions Neuromuscular re-education;Passive range of motion;Manual techniques;Patient/family education;Taping;Therapeutic exercise;Therapeutic activities;Functional mobility training;Moist Heat;Ultrasound;Cryotherapy;Electrical Stimulation;Dry needling;Balance training;Stair training;Gait training;ADLs/Self Care Home Management    PT Home Exercise Plan Seated Overhead DB press, Seated Diagonal Pulls with GTB, Seated shoulder W with scaption with GTB    Consulted and Agree with Plan of Care Patient           Patient will benefit from skilled therapeutic intervention in order to improve the following deficits and impairments:  Abnormal gait,Pain,Improper body mechanics,Postural dysfunction,Decreased activity tolerance,Decreased endurance,Decreased range of motion,Decreased strength,Decreased balance,Difficulty walking,Impaired flexibility,Hypomobility,Decreased mobility,Impaired UE functional use  Visit Diagnosis: Chronic left shoulder pain  Stiffness of left shoulder, not elsewhere classified     Problem List Patient Active Problem List   Diagnosis Date Noted  . Lymphedema 01/17/2016  . Venous (peripheral) insufficiency 01/17/2016  . Pain in limb 01/17/2016  . Iron deficiency anemia due to chronic blood loss 01/09/2016  . History of endometrial  cancer 08/01/2015  . Endometrial adenocarcinoma ([place]) 07/13/2015    Class: History of  . Inguinal lymphadenopathy 07/13/2015  . Long term current use of insulin ([place]) 12/12/2013  . Microalbuminuria 12/12/2013  . Diabetes ([place]) 07/28/2013  . BP (high blood pressure) 07/28/2013  . HLD (hyperlipidemia) 07/28/2013  . Adiposity 07/28/2013  . Obstructive apnea 07/28/2013  . Arthritis, degenerative 07/28/2013  . Apnea, sleep 07/28/2013  . Thyroid nodule 07/28/2013     Durwin Reges DPT [address], SPT  Durwin Reges 06/13/2020, 4:48 PM  [place] [facility] [address] [place], [address] Phone: [phone]   Fax:  [phone]  Name: Christine Lawson MRN: [id number] Date of Birth: June 14, 1960

## 2020-06-18 ENCOUNTER — Encounter: Payer: Medicare Other | Admitting: Physical Therapy

## 2020-06-20 ENCOUNTER — Encounter: Payer: Medicare Other | Admitting: Physical Therapy

## 2020-06-25 ENCOUNTER — Encounter: Payer: Medicare Other | Admitting: Physical Therapy

## 2020-06-27 ENCOUNTER — Encounter: Payer: Medicare Other | Admitting: Physical Therapy

## 2020-07-02 ENCOUNTER — Other Ambulatory Visit: Payer: Self-pay

## 2020-07-02 ENCOUNTER — Encounter: Payer: Medicare Other | Attending: Internal Medicine | Admitting: Dietician

## 2020-07-02 VITALS — BP 120/80 | Ht 63.0 in | Wt 264.9 lb

## 2020-07-02 DIAGNOSIS — Z794 Long term (current) use of insulin: Secondary | ICD-10-CM | POA: Diagnosis present

## 2020-07-02 DIAGNOSIS — E1165 Type 2 diabetes mellitus with hyperglycemia: Secondary | ICD-10-CM | POA: Diagnosis present

## 2020-07-02 NOTE — Patient Instructions (Signed)
   Keep portions of starchy foods to 1 cup or less with each meal.   For vegetables, eat 1 cup or more.   If you eat cereal for breakfast keep to about 1/2 bowl and have a protein food like a spoon of peanut butter with it. Try Belvita Soft Baked breakfast biscuits with some peanut butter.  The best type of milk is soy ("light"), unsweetened almond milk, or Fairlife milk.

## 2020-07-02 NOTE — Progress Notes (Signed)
Diabetes Self-Management Education  Visit Type:  Follow-up  Appt. Start Time: 1330 Appt. End Time: 1430  07/02/2020  [name], identified by name and date of birth, is a 60 y.o. female with a diagnosis of Diabetes:  .   ASSESSMENT  Blood pressure 120/80, height 5\' 3"  (1.6 m), weight 264 lb 14.4 oz (120.2 kg). Body mass index is 46.92 kg/m.    Diabetes Self-Management Education - 76/16/07 3710      Complications   How often do you check your blood sugar? > 4 times/day    Fasting Blood glucose range (mg/dL) 70-129;130-179;180-200;>200   patient reports range of 52-290 for all times of day   Postprandial Blood glucose range (mg/dL) 70-129;130-179;180-200;>200    Number of hypoglycemic episodes per month 1    Can you tell when your blood sugar is low? Yes    What do you do if your blood sugar is low? eats glucose tabs or drinks ginger ale    Have you had a dilated eye exam in the past 12 months? Yes    Have you had a dental exam in the past 12 months? Yes    Are you checking your feet? No      Dietary Intake   Breakfast 3 meals and 3 snacks daily      Exercise   Exercise Type ADL's      Patient Education   Nutrition management  Role of diet in the treatment of diabetes and the relationship between the three main macronutrients and blood glucose level;Food label reading, portion sizes and measuring food.;Meal timing in regards to the patients' current diabetes medication.;Meal options for control of blood glucose level and chronic complications.    Physical activity and exercise  Role of exercise on diabetes management, blood pressure control and cardiac health.    Monitoring Taught/discussed recording of test results and interpretation of SMBG.      Post-Education Assessment   Patient understands the diabetes disease and treatment process. Demonstrates understanding / competency    Patient understands incorporating nutritional management into lifestyle. Demonstrates  understanding / competency    Patient undertands incorporating physical activity into lifestyle. Demonstrates understanding / competency    Patient understands using medications safely. Demonstrates understanding / competency    Patient understands monitoring blood glucose, interpreting and using results Demonstrates understanding / competency    Patient understands prevention, detection, and treatment of acute complications. Demonstrates understanding / competency    Patient understands prevention, detection, and treatment of chronic complications. Needs Review    Patient understands how to develop strategies to address psychosocial issues. Needs Review    Patient understands how to develop strategies to promote health/change behavior. Needs Review      Outcomes   Program Status Completed           Learning Objective:  Patient will have a greater understanding of diabetes self-management. Patient education plan is to attend individual and/or group sessions per assessed needs and concerns.  Additional Notes: patient reports having assistance from "aide" with BG testing/ recording results, and with meal planning. She reports motivation to control food portions and lose weight.  Discussed having snacks only when needed to manage hunger or prevent hypoglycemia. Advised protein source with all meals and discussed options.  Plan:   Patient Instructions   Keep portions of starchy foods to 1 cup or less with each meal.   For vegetables, eat 1 cup or more.   If you eat cereal for breakfast keep  to about 1/2 bowl and have a protein food like a spoon of peanut butter with it. Try Belvita Soft Baked breakfast biscuits with some peanut butter.  The best type of milk is soy ("light"), unsweetened almond milk, or Fairlife milk.    Expected Outcomes:  Demonstrated interest in learning. Expect positive outcomes  Education material provided: Plate planner with food lists  If problems or  questions, patient to contact team via:  Phone and Email

## 2020-07-09 ENCOUNTER — Inpatient Hospital Stay: Payer: Medicare Other | Attending: Internal Medicine

## 2020-07-09 ENCOUNTER — Inpatient Hospital Stay (HOSPITAL_BASED_OUTPATIENT_CLINIC_OR_DEPARTMENT_OTHER): Payer: Medicare Other | Admitting: Internal Medicine

## 2020-07-09 ENCOUNTER — Inpatient Hospital Stay: Payer: Medicare Other

## 2020-07-09 ENCOUNTER — Other Ambulatory Visit: Payer: Self-pay

## 2020-07-09 ENCOUNTER — Encounter: Payer: Self-pay | Admitting: Internal Medicine

## 2020-07-09 DIAGNOSIS — K648 Other hemorrhoids: Secondary | ICD-10-CM | POA: Diagnosis not present

## 2020-07-09 DIAGNOSIS — D509 Iron deficiency anemia, unspecified: Secondary | ICD-10-CM | POA: Insufficient documentation

## 2020-07-09 DIAGNOSIS — E119 Type 2 diabetes mellitus without complications: Secondary | ICD-10-CM | POA: Diagnosis not present

## 2020-07-09 DIAGNOSIS — D500 Iron deficiency anemia secondary to blood loss (chronic): Secondary | ICD-10-CM

## 2020-07-09 DIAGNOSIS — Z833 Family history of diabetes mellitus: Secondary | ICD-10-CM | POA: Diagnosis not present

## 2020-07-09 DIAGNOSIS — C541 Malignant neoplasm of endometrium: Secondary | ICD-10-CM | POA: Diagnosis present

## 2020-07-09 DIAGNOSIS — Z79899 Other long term (current) drug therapy: Secondary | ICD-10-CM | POA: Diagnosis not present

## 2020-07-09 DIAGNOSIS — Z882 Allergy status to sulfonamides status: Secondary | ICD-10-CM | POA: Diagnosis not present

## 2020-07-09 LAB — BASIC METABOLIC PANEL
Anion gap: 10 (ref 5–15)
BUN: 11 mg/dL (ref 6–20)
CO2: 28 mmol/L (ref 22–32)
Calcium: 8.8 mg/dL — ABNORMAL LOW (ref 8.9–10.3)
Chloride: 102 mmol/L (ref 98–111)
Creatinine, Ser: 0.75 mg/dL (ref 0.44–1.00)
GFR, Estimated: >60 mL/min (ref >60)
Glucose, Bld: 117 mg/dL — ABNORMAL HIGH (ref 70–99)
Potassium: 3.7 mmol/L (ref 3.5–5.1)
Sodium: 140 mmol/L (ref 135–145)

## 2020-07-09 LAB — CBC WITH DIFFERENTIAL/PLATELET
Abs Immature Granulocytes: 0.06 10*3/uL (ref 0.00–0.07)
Basophils Absolute: 0.1 10*3/uL (ref 0.0–0.1)
Basophils Relative: 1 %
Eosinophils Absolute: 0.3 10*3/uL (ref 0.0–0.5)
Eosinophils Relative: 3 %
HCT: 38.6 % (ref 36.0–46.0)
Hemoglobin: 12.3 g/dL (ref 12.0–15.0)
Immature Granulocytes: 1 %
Lymphocytes Relative: 23 %
Lymphs Abs: 2.3 10*3/uL (ref 0.7–4.0)
MCH: 26.2 pg (ref 26.0–34.0)
MCHC: 31.9 g/dL (ref 30.0–36.0)
MCV: 82.1 fL (ref 80.0–100.0)
Monocytes Absolute: 0.7 10*3/uL (ref 0.1–1.0)
Monocytes Relative: 7 %
Neutro Abs: 6.4 10*3/uL (ref 1.7–7.7)
Neutrophils Relative %: 65 %
Platelets: 406 10*3/uL — ABNORMAL HIGH (ref 150–400)
RBC: 4.7 MIL/uL (ref 3.87–5.11)
RDW: 14.1 % (ref 11.5–15.5)
WBC: 9.8 10*3/uL (ref 4.0–10.5)
nRBC: 0 % (ref 0.0–0.2)

## 2020-07-09 LAB — IRON AND TIBC
Iron: 75 ug/dL (ref 28–170)
Saturation Ratios: 22 % (ref 10.4–31.8)
TIBC: 339 ug/dL (ref 250–450)
UIBC: 264 ug/dL

## 2020-07-09 LAB — FERRITIN: Ferritin: 237 ng/mL (ref 11–307)

## 2020-07-09 NOTE — Progress Notes (Signed)
[facility] OFFICE PROGRESS NOTE  Patient Care Team: Tracie Harrier, MD as PCP - General (Internal Medicine) Clent Jacks, RN as Registered Nurse  Cancer Staging No matching staging information was found for the patient.   Oncology History  Endometrial adenocarcinoma ([place])  07/13/2015 Initial Diagnosis   Endometrial adenocarcinoma ([place])    Anemia of iron deficiency unresponsive to oral iron therapy - got IV Venofer 1 g in Feb 2015, then on oral Ferrous sulfate 1 tablet daily. (labs on 03/28/13 had showed hemoglobin 10.0, MCV 71.3, WBC 10,800, ANC 7990, ALC 1640, platelet count 564, ferritin 27, serum iron 33, iron saturation low at 7%, TIBC 452. In June 2014 - hemoglobin 9.8, WBC 10,700, platelets 551, LFT unremarkable except alkaline phos of 120, creatinine 0.6). Stool Hemoccult was negative x2 in November 2014.  Colonoscopy July 2013 had shown internal hemorrhoids.  INTERVAL HISTORY:  Christine Lawson 60 y.o.  female pleasant patient above history of iron deficiency anemia; also history of early stage endometrial cancer is here for follow-up.  Patient denies any blood in stools or black-colored stools.  Denies any nausea vomiting or abdominal pain.  No new shortness of breath or cough.  She continues on iron pills twice a day.  Review of Systems  Constitutional: Negative for chills, diaphoresis, fever, malaise/fatigue and weight loss.  HENT: Negative for nosebleeds and sore throat.   Eyes: Negative for double vision.  Respiratory: Negative for cough, hemoptysis, sputum production and wheezing.   Cardiovascular: Negative for chest pain, palpitations, orthopnea and leg swelling.  Gastrointestinal: Negative for abdominal pain, blood in stool, constipation, diarrhea, heartburn, melena, nausea and vomiting.  Genitourinary: Negative for dysuria, frequency and urgency.  Musculoskeletal: Negative for back pain and joint pain.  Skin: Negative.  Negative for itching  and rash.  Neurological: Negative for dizziness, tingling, focal weakness, weakness and headaches.  Endo/Heme/Allergies: Does not bruise/bleed easily.  Psychiatric/Behavioral: Negative for depression. The patient is not nervous/anxious and does not have insomnia.       PAST MEDICAL HISTORY :  Past Medical History:  Diagnosis Date  . Diabetes mellitus without complication ([place])   . Endometrial adenocarcinoma ([place])   . GERD (gastroesophageal reflux disease)   . Hematochezia   . Hyperlipidemia   . Hypertension   . IDA (iron deficiency anemia)   . Inguinal lymphadenopathy 07/13/2015  . Irritable bowel syndrome   . Obesity   . Osteoarthritis   . Sleep apnea     PAST SURGICAL HISTORY :   Past Surgical History:  Procedure Laterality Date  . ABDOMINAL HYSTERECTOMY  feb 2013  . COLONOSCOPY WITH PROPOFOL N/A 08/02/2015   Procedure: COLONOSCOPY WITH PROPOFOL;  Surgeon: Manya Silvas, MD;  Location: [facility] ENDOSCOPY;  Service: Endoscopy;  Laterality: N/A;  . ESOPHAGOGASTRODUODENOSCOPY (EGD) WITH PROPOFOL N/A 08/02/2015   Procedure: ESOPHAGOGASTRODUODENOSCOPY (EGD) WITH PROPOFOL;  Surgeon: Manya Silvas, MD;  Location: [facility] ENDOSCOPY;  Service: Endoscopy;  Laterality: N/A;    FAMILY HISTORY :   Family History  Problem Relation Age of Onset  . Diabetes Mother   . Diabetes Father   . Breast cancer Neg Hx     SOCIAL HISTORY:   Social History   Tobacco Use  . Smoking status: Never Smoker  . Smokeless tobacco: Never Used  Substance Use Topics  . Alcohol use: No  . Drug use: No    ALLERGIES:  is allergic to solifenacin, contrast media [iodinated diagnostic agents], dicyclomine, isovue [iopamidol], and sulfa antibiotics.  MEDICATIONS:  Current Outpatient Medications  Medication Sig Dispense Refill  . albuterol (PROVENTIL HFA;VENTOLIN HFA) 108 (90 Base) MCG/ACT inhaler Inhale 1 puff into the lungs every 6 (six) hours as needed for wheezing or shortness of breath.    .  allopurinol (ZYLOPRIM) 100 MG tablet Take 100 mg by mouth daily.     Marland Kitchen amLODipine (NORVASC) 10 MG tablet Take 10 mg by mouth daily.    Marland Kitchen ascorbic acid (VITAMIN C) 500 MG tablet Take 500 mg by mouth daily.    Marland Kitchen aspirin 81 MG EC tablet Take 81 mg by mouth daily. Swallow whole.    . budesonide-formoterol (SYMBICORT) 80-4.5 MCG/ACT inhaler INHALE 2 INHALATIONS INTO THE LUNGS 2 (TWO) TIMES DAILY    . cholecalciferol (VITAMIN D) 1000 UNITS tablet Take 1,000 Units by mouth daily.    . diclofenac sodium (VOLTAREN) 1 % GEL Apply 1 application topically 2 (two) times daily as needed for pain.    Marland Kitchen dicyclomine (BENTYL) 10 MG capsule Take 10 mg by mouth daily.    . enalapril (VASOTEC) 5 MG tablet Take 5 mg by mouth daily.    . ferrous sulfate 325 (65 FE) MG tablet Take 325 mg by mouth 2 (two) times daily with a meal.     . HUMALOG KWIKPEN 100 UNIT/ML KwikPen Inject into the skin 3 (three) times daily. 26 units with breakfast, 24 units with lunch, 80 units with supper    . insulin glargine, 2 Unit Dial, (TOUJEO MAX SOLOSTAR) 300 UNIT/ML Solostar Pen Inject 80-120 Units into the skin daily.    . Lifitegrast 5 % SOLN Apply 1 drop to eye as needed for dry eyes.    . montelukast (SINGULAIR) 10 MG tablet Take 10 mg by mouth daily.    Marland Kitchen omeprazole (PRILOSEC) 40 MG capsule Take 40 mg by mouth daily.     . rosuvastatin (CRESTOR) 5 MG tablet Take 5 mg by mouth daily.    . vitamin B-12 (CYANOCOBALAMIN) 500 MCG tablet Take 1 tablet by mouth daily.     No current facility-administered medications for this visit.    PHYSICAL EXAMINATION: ECOG PERFORMANCE STATUS: 0 - Asymptomatic  BP 136/67 (BP Location: Left Arm, Patient Position: Sitting, Cuff Size: Large)   Pulse 79   Temp 99.3 F (37.4 C) (Tympanic)   Resp 16   Ht 5\' 3"  (1.6 m)   Wt 261 lb (118.4 kg)   SpO2 96%   BMI 46.23 kg/m   Filed Weights   07/09/20 1342  Weight: 261 lb (118.4 kg)    Physical Exam Constitutional:      Comments: Alone.   Walking with a cane.  Obese.  HENT:     Head: Normocephalic and atraumatic.     Mouth/Throat:     Pharynx: No oropharyngeal exudate.  Eyes:     Pupils: Pupils are equal, round, and reactive to light.  Cardiovascular:     Rate and Rhythm: Normal rate and regular rhythm.  Pulmonary:     Effort: No respiratory distress.     Breath sounds: No wheezing.  Abdominal:     General: Bowel sounds are normal. There is no distension.     Palpations: Abdomen is soft. There is no mass.     Tenderness: There is no abdominal tenderness. There is no guarding or rebound.  Musculoskeletal:        General: No tenderness. Normal range of motion.     Cervical back: Normal range of motion and neck supple.  Skin:  General: Skin is warm.  Neurological:     Mental Status: She is alert and oriented to person, place, and time.  Psychiatric:        Mood and Affect: Affect normal.     LABORATORY DATA:  I have reviewed the data as listed    Component Value Date/Time   NA 140 07/09/2020 1325   NA 141 06/03/2013 2039   K 3.7 07/09/2020 1325   K 3.7 06/03/2013 2039   CL 102 07/09/2020 1325   CL 106 06/03/2013 2039   CO2 28 07/09/2020 1325   CO2 30 06/03/2013 2039   GLUCOSE 117 (H) 07/09/2020 1325   GLUCOSE 59 (L) 06/03/2013 2039   BUN 11 07/09/2020 1325   BUN 6 (L) 06/03/2013 2039   CREATININE 0.75 07/09/2020 1325   CREATININE 0.59 (L) 06/03/2013 2039   CALCIUM 8.8 (L) 07/09/2020 1325   CALCIUM 8.8 06/03/2013 2039   PROT 7.9 11/17/2018 1313   PROT 8.8 (H) 06/03/2013 2039   ALBUMIN 3.6 11/17/2018 1313   ALBUMIN 3.6 06/03/2013 2039   AST 26 11/17/2018 1313   AST 38 (H) 06/03/2013 2039   ALT 24 11/17/2018 1313   ALT 44 06/03/2013 2039   ALKPHOS 118 11/17/2018 1313   ALKPHOS 134 (H) 06/03/2013 2039   BILITOT 0.7 11/17/2018 1313   BILITOT 0.4 06/03/2013 2039   GFRNONAA >60 07/09/2020 1325   GFRNONAA >60 06/03/2013 2039   GFRAA >60 07/05/2019 1239   GFRAA >60 06/03/2013 2039    No results  found for: SPEP, UPEP  Lab Results  Component Value Date   WBC 9.8 07/09/2020   NEUTROABS 6.4 07/09/2020   HGB 12.3 07/09/2020   HCT 38.6 07/09/2020   MCV 82.1 07/09/2020   PLT 406 (H) 07/09/2020      Chemistry      Component Value Date/Time   NA 140 07/09/2020 1325   NA 141 06/03/2013 2039   K 3.7 07/09/2020 1325   K 3.7 06/03/2013 2039   CL 102 07/09/2020 1325   CL 106 06/03/2013 2039   CO2 28 07/09/2020 1325   CO2 30 06/03/2013 2039   BUN 11 07/09/2020 1325   BUN 6 (L) 06/03/2013 2039   CREATININE 0.75 07/09/2020 1325   CREATININE 0.59 (L) 06/03/2013 2039      Component Value Date/Time   CALCIUM 8.8 (L) 07/09/2020 1325   CALCIUM 8.8 06/03/2013 2039   ALKPHOS 118 11/17/2018 1313   ALKPHOS 134 (H) 06/03/2013 2039   AST 26 11/17/2018 1313   AST 38 (H) 06/03/2013 2039   ALT 24 11/17/2018 1313   ALT 44 06/03/2013 2039   BILITOT 0.7 11/17/2018 1313   BILITOT 0.4 06/03/2013 2039       RADIOGRAPHIC STUDIES: I have personally reviewed the radiological images as listed and agreed with the findings in the report. No results found.   ASSESSMENT & PLAN:  Iron deficiency anemia due to chronic blood loss Iron deficiency anemia-question etiology. Today 12.3.  Iron studies pending.  Continue by mouth iron PO BID.  No IV iron today.   # History of endometrial cancer status post surgery-no adjuvant therapy; recent CT -NEG; contonue follow up with Gyn, Dr.Schermerhorn. STABLE.    DISPOSITION: labs print out # NO infusion  #  follow-up in 6 months with MD- iron studies-Ferritin/CBC BMP; possible ferrahem IV- Dr.B   No orders of the defined types were placed in this encounter.  All questions were answered. The patient knows to call the clinic  with any problems, questions or concerns.      Cammie Sickle, MD 07/09/2020 2:18 PM

## 2020-07-09 NOTE — Addendum Note (Signed)
Addended by: Delice Bison E on: 07/09/2020 03:47 PM   Modules accepted: Orders

## 2020-07-09 NOTE — Assessment & Plan Note (Signed)
Iron deficiency anemia-question etiology. Today 12.3.  Iron studies pending.  Continue by mouth iron PO BID.  No IV iron today.   # History of endometrial cancer status post surgery-no adjuvant therapy; recent CT -NEG; contonue follow up with Gyn, Dr.Schermerhorn. STABLE.    DISPOSITION: labs print out # NO infusion  #  follow-up in 6 months with MD- iron studies-Ferritin/CBC BMP; possible ferrahem IV- Dr.B

## 2020-07-16 ENCOUNTER — Ambulatory Visit: Payer: Medicare Other | Admitting: Physical Therapy

## 2020-07-18 ENCOUNTER — Ambulatory Visit: Payer: Medicare Other | Admitting: Physical Therapy

## 2020-07-23 ENCOUNTER — Ambulatory Visit: Payer: Medicare Other | Admitting: Physical Therapy

## 2020-07-25 ENCOUNTER — Ambulatory Visit: Payer: Medicare Other | Admitting: Physical Therapy

## 2020-07-31 ENCOUNTER — Ambulatory Visit: Payer: Medicare Other | Admitting: Physical Therapy

## 2020-07-31 ENCOUNTER — Ambulatory Visit: Payer: Medicare Other | Attending: Internal Medicine | Admitting: Physical Therapy

## 2020-07-31 ENCOUNTER — Other Ambulatory Visit: Payer: Self-pay

## 2020-07-31 ENCOUNTER — Encounter: Payer: Self-pay | Admitting: Physical Therapy

## 2020-07-31 DIAGNOSIS — G8929 Other chronic pain: Secondary | ICD-10-CM | POA: Diagnosis present

## 2020-07-31 DIAGNOSIS — M25512 Pain in left shoulder: Secondary | ICD-10-CM | POA: Insufficient documentation

## 2020-07-31 DIAGNOSIS — M25612 Stiffness of left shoulder, not elsewhere classified: Secondary | ICD-10-CM | POA: Insufficient documentation

## 2020-07-31 NOTE — Therapy (Signed)
[facility] [address] [place], [address] Phone: [phone]   Fax:  [phone]  Physical Therapy Evaluation  Patient Details  Name: Christine Lawson MRN: [id number] Date of Birth: 1960-08-06 No data recorded  Encounter Date: 07/31/2020   PT End of Session - 07/31/20 1614    Visit Number 1    Number of Visits 17    Date for PT Re-Evaluation 09/28/20    Authorization - Visit Number 1    Authorization - Number of Visits 17    PT Start Time 1600    PT Stop Time 4580    PT Time Calculation (min) 45 min    Activity Tolerance Patient tolerated treatment well    Behavior During Therapy [facility] for tasks assessed/performed           Past Medical History:  Diagnosis Date  . Diabetes mellitus without complication ([place])   . Endometrial adenocarcinoma ([place])   . GERD (gastroesophageal reflux disease)   . Hematochezia   . Hyperlipidemia   . Hypertension   . IDA (iron deficiency anemia)   . Inguinal lymphadenopathy 07/13/2015  . Irritable bowel syndrome   . Obesity   . Osteoarthritis   . Sleep apnea     Past Surgical History:  Procedure Laterality Date  . ABDOMINAL HYSTERECTOMY  feb 2013  . COLONOSCOPY WITH PROPOFOL N/A 08/02/2015   Procedure: COLONOSCOPY WITH PROPOFOL;  Surgeon: Manya Silvas, MD;  Location: [facility] ENDOSCOPY;  Service: Endoscopy;  Laterality: N/A;  . ESOPHAGOGASTRODUODENOSCOPY (EGD) WITH PROPOFOL N/A 08/02/2015   Procedure: ESOPHAGOGASTRODUODENOSCOPY (EGD) WITH PROPOFOL;  Surgeon: Manya Silvas, MD;  Location: [facility] ENDOSCOPY;  Service: Endoscopy;  Laterality: N/A;    There were no vitals filed for this visit.    Subjective Assessment - 07/31/20 1603    Pertinent History Pt is a 60 year old female familiar with this clinic with chronic pain of R knee. Cannot remember exactly when it started, was seen at this clinic for this clinic for R knee pain a couple years ago. Her pain is diffuse  "all around the knee" with most pain at back of the knee. Describes pain as achy and dull, with occassional sharp pain. No N/T or radiation to ankle or hip. Aggravated by squatting, lifting, sitting >39mins, walking >94mins, standing >66mins. Has pain with community ambulation and is unable to run errands d/t this. Reports pain is 7/10 currently, 9/10 at the worst, and 6/10 at best. Pt and her sister are trying to lose weight to help with this pain. Pt denies N/V, B&B changes, unexplained weight fluctuation, saddle paresthesia, fever, night sweats, or unrelenting night pain at this time. 1 fall in the past 6 months when her R knee "went out on her" and she didn't have her cane in her room, and was able to get up with help from her aid.    Limitations House hold activities;Lifting;Walking    How long can you sit comfortably? 5min    How long can you stand comfortably? 52min    How long can you walk comfortably? 67min    Diagnostic tests none recently    Patient Stated Goals decrease pain, make my legs stronger    Currently in Pain? Yes    Pain Score 7     Pain Location Knee    Pain Orientation Right;Posterior;Anterior    Pain Descriptors / Indicators Aching;Dull    Pain Type Chronic pain    Pain Onset More  than a month ago    Pain Frequency Constant    Aggravating Factors  lifting, squatting, walking/standing >53mins, sitting >22mins    Pain Relieving Factors rest    Effect of Pain on Daily Activities unable to complete community activities, and household ADLs               OBJECTIVE  MUSCULOSKELETAL: Tremor: Absent Bulk: Normal Tone: Normal, no spasticity, rigidity, or clonus No trophic changes noted to lower extremities. No ecchymosis, erythema, or edema noted around knee. No gross knee deformity noted  Posture Wide BOS, increased lumbar lordosis, lower crossed  Gait Wide BOS with cane in RUE, short R step length, decreased RLE stand time, minimal trunk rotation   Palpation TTP  with concordant pain sign to distal medial hamstring musculature, pes anserine tendon and lateral and medial joint lines No TTP to patellar or quad tendon  Strength R/L 3+/4- Hip flexion 3+/3+ Hip external rotation 4/4+ Hip internal rotation Hip extension 5x STS (unable to lay prone): 22sec *3/3+ Hip abduction *3+/4+ Knee extension *3/4+ Knee flexion *5/5 Ankle Dorsiflexion  *indicates pain  PROM: R knee flex: 108d ext 10d L knee flex 121d ext 0d  AROM  Knee R/L Flexion: 115d/105d Extension: 5d/13d w/ pain at post knee *indicates pain   Hip R/L Flexion: approx 100d bilat Extension:in standing hip ext to neutral only, unable to assess in prone Abduction: minimal limitation bilat Adduction:WNL bilat Internal Rotation: near 0d bilat External Rotation: WNL bilat with tension at end range *indicates pain   Muscle Length Hamstring length: shortened bilat Quad length ([place]): shortened bilat Hip Flexor length Marcello Moores): shortened bilat IT band length Nicoletta Dress): WNL with some pain to palpation   Passive Accessory Motion Superior Tibiofibular Joint: WNL Knee: guarding  Patella: WNL all directions  NEUROLOGICAL:  Mental Status Patient is oriented to person, place and time.  Recent memory is intact.  Remote memory is intact.  Attention span and concentration are intact.  Expressive speech is intact.  Patient's fund of knowledge is within normal limits for educational level.  Sensation Grossly intact to light touch bilateral LEs as determined by testing dermatomes L2-S2 Proprioception and hot/cold testing deferred on this date  McMurray Test:  Noble Compression Test: negative Pivot-Shift Test: negative  Thessaly Test: negative   SPECIAL TESTS  Other 6MWT 690ft 10MWT fastest 0.9m/s self selected 0.32m/s Functional squat to minimal depth with forward trunk lean compensating for decreased hip and knee flex, L wt shift, wide BOS with bilat ankle eversion + hip  ER Lateral step down: unable to complete with control, heavy knee valgus and hip drop R>L STS uses UEs for push off and for safety with sit  Following 6MWT pt reports decreased pain to 6/10  Ther-Ex PT reviewed the following HEP with patient with patient able to demonstrate a set of the following with min cuing for correction needed. PT educated patient on parameters of therex (how/when to inc/decrease intensity, frequency, rep/set range, stretch hold time, and purpose of therex) with verbalized understanding. Educaiton on amb 5-54min daily and nature of OA with understanding Access Code: [email or web address] Standing Hip Abduction with Counter Support - 5 x weekly - 3 sets - 10 reps Sit to Stand Without Arm Support - 1 x daily - 5 x weekly - 3 sets - 6-10 reps      Pt will increase LEFS by at least 9 points in order to demonstrate significant improvement in lower extremity function.      Pt will  decrease worst pain as reported on NPRS by at least 3 points in order to demonstrate clinically significant reduction in ankle/foot pain.   Pt will increase strength of  by at least 1/2 MMT grade in order to demonstrate improvement in strength and function   ASSESSMENT Pt is a pleasant year-old female/female referred for knee pain. PT examination reveals deficits in . Pt will benefit from PT services to address deficits in strength, mobility, and pain in order to return to full function at home with less knee pain.    Plan HEP: Next visit:     Assessment   Plan Pt will be independent with HEP in order to improve strength and balance in order to decrease fall risk and improve function at home and work.  Pt will increase LEFS by at least 9 points in order to demonstrate significant improvement in lower extremity function.  Pt will decrease worst pain as reported on NPRS by at least 3 points in order to demonstrate clinically significant reduction in pain.                 Objective  measurements completed on examination: See above findings.               PT Education - 07/31/20 1613    Education provided Yes    Education Details Patient was educated on diagnosis, anatomy and pathology involved, prognosis, role of PT, and was given an HEP, demonstrating exercise with proper form following verbal and tactile cues, and was given a paper hand out to continue exercise at home. Pt was educated on and agreed to plan of care.    Person(s) Educated Patient    Methods Explanation;Demonstration;Verbal cues    Comprehension Verbalized understanding;Returned demonstration;Verbal cues required            PT Short Term Goals - 07/31/20 1650      PT SHORT TERM GOAL #1   Title Pt will be independent with HEP in order to decrease ankle pain and increase strength in order to improve pain-free function at home and work.    Baseline 07/31/20    Time 4    Period Weeks    Status New             PT Long Term Goals - 07/31/20 1650      PT LONG TERM GOAL #1   Title Pt will decrease worst knee pain on NPRS in order to demonstrate clinically significant reduction in pain    Baseline 07/31/20 9/10    Time 8    Period Weeks    Status New      PT LONG TERM GOAL #2   Title Pt will demonstrate full AROM of the R knee in order to demonstrate full mobility to perform household ADLs and community activity    Baseline 07/31/20 13 - 105d    Time 8    Period Weeks    Status New      PT LONG TERM GOAL #3   Title Patient will increase FOTO score to 63 to demonstrate predicted increase in functional mobility to complete ADLs    Baseline 07/31/20 56    Time 8    Period Weeks    Status New      PT LONG TERM GOAL #4   Title Pt will decrease 5TSTS by to least 12 seconds in order to demonstrate clinically significant improvement in LE strength    Baseline 07/31/20 22sec    Time 8  Period Weeks    Status New      PT LONG TERM GOAL #5   Title Pt will demosntrate gait speed of  at least 1.72m/s to demonstrate appropriate gait speed for community activity    Baseline 07/31/20 0.23m/s    Time 8    Period Weeks    Status New      PT LONG TERM GOAL #6   Title Pt will increase 6MWT by at least 88m (141ft) in order to demonstrate clinically significant improvement in cardiopulmonary endurance and community ambulation    Baseline 07/31/20 690ft    Time 8    Period Weeks    Status New                  Plan - 07/31/20 1655    Clinical Impression Statement Pt is a 60 year old female presenting with chronic R knee pain d/t OA. Impairments in obesity, decreased knee and hip P/AROM, decreased RLE strength, decreased balance, decreased gait speed, abnormal gait mechanics, abnormal posure, and pain. Activity limitations in walking/standing >71mins, sitting >83mins, ind transfers, lifting, squatting, bending, and stooping; inhibiting full participation in community errands and household ADLs. Pt will benefit from aforementioned impairments to return to optimal PLOF.    Personal Factors and Comorbidities Fitness;Comorbidity 1;Past/Current Experience;Time since onset of injury/illness/exacerbation;Transportation    Comorbidities obesity    Examination-Activity Limitations Lift;Caring for Others;Carry;Bed Mobility;Squat;Stairs;Stand;Transfers    Examination-Participation Restrictions Community Activity;Cleaning;Laundry;Meal Prep    Stability/Clinical Decision Making Evolving/Moderate complexity    Clinical Decision Making Moderate    Rehab Potential Fair    Clinical Impairments Affecting Rehab Potential (-) transportation, multiple comorbidities (DM II, obesity, GERD, communication barrier, sedentary lifestyle (+) positive attitude,     PT Frequency 2x / week    PT Duration 8 weeks    PT Treatment/Interventions Neuromuscular re-education;Passive range of motion;Manual techniques;Patient/family education;Taping;Therapeutic exercise;Therapeutic activities;Functional mobility  training;Moist Heat;Ultrasound;Cryotherapy;Electrical Stimulation;Dry needling;Balance training;Stair training;Gait training;ADLs/Self Care Home Management;Aquatic Therapy;Spinal Manipulations;Joint Manipulations;DME Instruction;Iontophoresis 4mg /ml Dexamethasone;Traction    PT Next Visit Plan SLS, stairs, HEP review    PT Home Exercise Plan STS, standing hip abd, walk daily    Consulted and Agree with Plan of Care Patient           Patient will benefit from skilled therapeutic intervention in order to improve the following deficits and impairments:  Abnormal gait,Pain,Improper body mechanics,Postural dysfunction,Decreased activity tolerance,Decreased endurance,Decreased range of motion,Decreased strength,Decreased balance,Difficulty walking,Impaired flexibility,Hypomobility,Decreased mobility,Impaired UE functional use,Increased fascial restricitons,Impaired tone,Increased muscle spasms,Obesity  Visit Diagnosis: Chronic left shoulder pain  Stiffness of left shoulder, not elsewhere classified     Problem List Patient Active Problem List   Diagnosis Date Noted  . Lymphedema 01/17/2016  . Venous (peripheral) insufficiency 01/17/2016  . Pain in limb 01/17/2016  . Iron deficiency anemia due to chronic blood loss 01/09/2016  . History of endometrial cancer 08/01/2015  . Endometrial adenocarcinoma ([place]) 07/13/2015    Class: History of  . Inguinal lymphadenopathy 07/13/2015  . Long term current use of insulin ([place]) 12/12/2013  . Microalbuminuria 12/12/2013  . Diabetes ([place]) 07/28/2013  . BP (high blood pressure) 07/28/2013  . HLD (hyperlipidemia) 07/28/2013  . Adiposity 07/28/2013  . Obstructive apnea 07/28/2013  . Arthritis, degenerative 07/28/2013  . Apnea, sleep 07/28/2013  . Thyroid nodule 07/28/2013   Durwin Reges DPT Durwin Reges 07/31/2020, 5:00 PM  [facility] [address], [place],  [address] Phone: [phone]   Fax:  [phone]  Name: Christine Lawson MRN: [id number] Date of Birth: 1960-04-04

## 2020-08-02 ENCOUNTER — Ambulatory Visit: Payer: Medicare Other | Admitting: Physical Therapy

## 2020-08-07 ENCOUNTER — Ambulatory Visit: Payer: Medicare Other | Attending: Internal Medicine | Admitting: Physical Therapy

## 2020-08-07 ENCOUNTER — Encounter: Payer: Self-pay | Admitting: Physical Therapy

## 2020-08-07 ENCOUNTER — Other Ambulatory Visit: Payer: Self-pay

## 2020-08-07 DIAGNOSIS — G8929 Other chronic pain: Secondary | ICD-10-CM | POA: Diagnosis present

## 2020-08-07 DIAGNOSIS — M25561 Pain in right knee: Secondary | ICD-10-CM | POA: Diagnosis not present

## 2020-08-07 NOTE — Addendum Note (Signed)
Addended by: Kelton Pillar on: 08/07/2020 04:38 PM   Modules accepted: Orders

## 2020-08-07 NOTE — Therapy (Signed)
[facility] [address], [place], [address] Phone: [phone]   Fax:  [phone]  Physical Therapy Treatment  Patient Details  Name: Christine Lawson MRN: [id number] Date of Birth: [birth year] No data recorded  Encounter Date: 08/07/2020   PT End of Session - 08/07/20 1406    Visit Number 2    Number of Visits 17    Date for PT Re-Evaluation 09/28/20    Authorization - Visit Number 2    Authorization - Number of Visits 17    PT Start Time 6333    PT Stop Time 1425    PT Time Calculation (min) 40 min    Activity Tolerance Patient tolerated treatment well    Behavior During Therapy [facility] for tasks assessed/performed           Past Medical History:  Diagnosis Date  . Diabetes mellitus without complication ([place])   . Endometrial adenocarcinoma ([place])   . GERD (gastroesophageal reflux disease)   . Hematochezia   . Hyperlipidemia   . Hypertension   . IDA (iron deficiency anemia)   . Inguinal lymphadenopathy 07/13/2015  . Irritable bowel syndrome   . Obesity   . Osteoarthritis   . Sleep apnea     Past Surgical History:  Procedure Laterality Date  . ABDOMINAL HYSTERECTOMY  feb 2013  . COLONOSCOPY WITH PROPOFOL N/A 08/02/2015   Procedure: COLONOSCOPY WITH PROPOFOL;  Surgeon: Manya Silvas, MD;  Location: [facility] ENDOSCOPY;  Service: Endoscopy;  Laterality: N/A;  . ESOPHAGOGASTRODUODENOSCOPY (EGD) WITH PROPOFOL N/A 08/02/2015   Procedure: ESOPHAGOGASTRODUODENOSCOPY (EGD) WITH PROPOFOL;  Surgeon: Manya Silvas, MD;  Location: [facility] ENDOSCOPY;  Service: Endoscopy;  Laterality: N/A;    There were no vitals filed for this visit.   Subjective Assessment - 08/07/20 1347    Subjective Patient reports her R knee pain is 7.5/10 today. Has been completing her HEP "most of the time" and has been trying to walk outside more with success.    Pertinent History Pt is a 60 year old female familiar with this clinic with  chronic pain of R knee. Cannot remember exactly when it started, was seen at this clinic for this clinic for R knee pain a couple years ago. Her pain is diffuse "all around the knee" with most pain at back of the knee. Describes pain as achy and dull, with occassional sharp pain. No N/T or radiation to ankle or hip. Aggravated by squatting, lifting, sitting >60mins, walking >1mins, standing >8mins. Has pain with community ambulation and is unable to run errands d/t this. Reports pain is 7/10 currently, 9/10 at the worst, and 6/10 at best. Pt and her sister are trying to lose weight to help with this pain. Pt denies N/V, B&B changes, unexplained weight fluctuation, saddle paresthesia, fever, night sweats, or unrelenting night pain at this time. 1 fall in the past 6 months when her R knee "went out on her" and she didn't have her cane in her room, and was able to get up with help from her aid.    Limitations House hold activities;Lifting;Walking    How long can you sit comfortably? 79min    How long can you stand comfortably? 57min    How long can you walk comfortably? 75min    Diagnostic tests none recently    Patient Stated Goals decrease pain, make my legs stronger    Pain Onset More than a month ago    Pain Onset 1  to 4 weeks ago              Ther-Ex Nustep seat 9 no UE L3 52mins for gentle strengthening and mobility Sit to Stand Without UE support 2x 10 with demo and max cuing initially for technique with good carry over following Standing hip abd x10 bilat with heavy cuing for technique with decent carry over Lateral band walks RTB 2x 12 each direction with consistent cuing for technique intermittent good carry over SLS unable; with 2 finger support 10sec bilat (HEP update) Wall sit 15/20/30sec encouragement OMEGA knee ext 20# 2x 10 (attempted 25# unable to obtain full ROM) Attempted omega leg press, unable                    PT Education - 08/07/20 1403    Education  provided Yes    Education Details therex form/technique    Person(s) Educated Patient    Methods Explanation;Demonstration;Verbal cues    Comprehension Verbalized understanding;Returned demonstration;Verbal cues required            PT Short Term Goals - 07/31/20 1650      PT SHORT TERM GOAL #1   Title Pt will be independent with HEP in order to decrease ankle pain and increase strength in order to improve pain-free function at home and work.    Baseline 07/31/20    Time 4    Period Weeks    Status New             PT Long Term Goals - 07/31/20 1650      PT LONG TERM GOAL #1   Title Pt will decrease worst knee pain on NPRS in order to demonstrate clinically significant reduction in pain    Baseline 07/31/20 9/10    Time 8    Period Weeks    Status New      PT LONG TERM GOAL #2   Title Pt will demonstrate full AROM of the R knee in order to demonstrate full mobility to perform household ADLs and community activity    Baseline 07/31/20 13 - 105d    Time 8    Period Weeks    Status New      PT LONG TERM GOAL #3   Title Patient will increase FOTO score to 63 to demonstrate predicted increase in functional mobility to complete ADLs    Baseline 07/31/20 56    Time 8    Period Weeks    Status New      PT LONG TERM GOAL #4   Title Pt will decrease 5TSTS by to least 12 seconds in order to demonstrate clinically significant improvement in LE strength    Baseline 07/31/20 22sec    Time 8    Period Weeks    Status New      PT LONG TERM GOAL #5   Title Pt will demosntrate gait speed of at least 1.63m/s to demonstrate appropriate gait speed for community activity    Baseline 07/31/20 0.28m/s    Time 8    Period Weeks    Status New      PT LONG TERM GOAL #6   Title Pt will increase 6MWT by at least 49m (164ft) in order to demonstrate clinically significant improvement in cardiopulmonary endurance and community ambulation    Baseline 07/31/20 643ft    Time 8    Period Weeks     Status New  Plan - 08/07/20 1420    Clinical Impression Statement PT initiated therex progression for increased knee mobility and LE strength with success. PT reviewed HEP with patient which she is able to complete with minimal corrections needed. Patient is able to comply with multimodal cuing for proper technique of therex, with good motivation and no increased pain throughout session. PT will continue progression as abl.e    Personal Factors and Comorbidities Fitness;Comorbidity 1;Past/Current Experience;Time since onset of injury/illness/exacerbation;Transportation    Examination-Activity Limitations Lift;Caring for Others;Carry;Bed Mobility;Squat;Stairs;Stand;Transfers    Examination-Participation Restrictions Community Activity;Cleaning;Laundry;Meal Prep    Stability/Clinical Decision Making Evolving/Moderate complexity    Clinical Decision Making Moderate    Rehab Potential Fair    Clinical Impairments Affecting Rehab Potential (-) transportation, multiple comorbidities (DM II, obesity, GERD, communication barrier, sedentary lifestyle (+) positive attitude,     PT Frequency 2x / week    PT Duration 8 weeks    PT Treatment/Interventions Neuromuscular re-education;Passive range of motion;Manual techniques;Patient/family education;Taping;Therapeutic exercise;Therapeutic activities;Functional mobility training;Moist Heat;Ultrasound;Cryotherapy;Electrical Stimulation;Dry needling;Balance training;Stair training;Gait training;ADLs/Self Care Home Management;Aquatic Therapy;Spinal Manipulations;Joint Manipulations;DME Instruction;Iontophoresis 4mg /ml Dexamethasone;Traction    PT Next Visit Plan SLS, stairs, HEP review    PT Home Exercise Plan STS, standing hip abd, walk daily    Consulted and Agree with Plan of Care Patient           Patient will benefit from skilled therapeutic intervention in order to improve the following deficits and impairments:  Abnormal  gait,Pain,Improper body mechanics,Postural dysfunction,Decreased activity tolerance,Decreased endurance,Decreased range of motion,Decreased strength,Decreased balance,Difficulty walking,Impaired flexibility,Hypomobility,Decreased mobility,Impaired UE functional use,Increased fascial restricitons,Impaired tone,Increased muscle spasms,Obesity  Visit Diagnosis: Chronic pain of right knee     Problem List Patient Active Problem List   Diagnosis Date Noted  . Lymphedema 01/17/2016  . Venous (peripheral) insufficiency 01/17/2016  . Pain in limb 01/17/2016  . Iron deficiency anemia due to chronic blood loss 01/09/2016  . History of endometrial cancer 08/01/2015  . Endometrial adenocarcinoma ([place]) 07/13/2015    Class: History of  . Inguinal lymphadenopathy 07/13/2015  . Long term current use of insulin ([place]) 12/12/2013  . Microalbuminuria 12/12/2013  . Diabetes ([place]) 07/28/2013  . BP (high blood pressure) 07/28/2013  . HLD (hyperlipidemia) 07/28/2013  . Adiposity 07/28/2013  . Obstructive apnea 07/28/2013  . Arthritis, degenerative 07/28/2013  . Apnea, sleep 07/28/2013  . Thyroid nodule 07/28/2013   Durwin Reges DPT Durwin Reges 08/07/2020, 3:45 PM  [facility] [address] [place], [address] Phone: [phone]   Fax:  [phone]  Name: Madisson Kulaga MRN: [id number] Date of Birth: 05-Apr-1960

## 2020-08-09 ENCOUNTER — Encounter: Payer: Self-pay | Admitting: Physical Therapy

## 2020-08-09 ENCOUNTER — Ambulatory Visit: Payer: Medicare Other | Admitting: Physical Therapy

## 2020-08-09 DIAGNOSIS — M25561 Pain in right knee: Secondary | ICD-10-CM | POA: Diagnosis not present

## 2020-08-09 NOTE — Therapy (Addendum)
[facility] [address] [place], [address] Phone: [phone]   Fax:  [phone]  Physical Therapy Treatment  Patient Details  Name: Christine Lawson MRN: [id number] Date of Birth: Jul 30, 1960 No data recorded  Encounter Date: 08/09/2020   PT End of Session - 08/09/20 1428     Visit Number 3    Number of Visits 17    Date for PT Re-Evaluation 09/28/20    Authorization - Visit Number 3    Authorization - Number of Visits 10    PT Start Time 2025    PT Stop Time 1507    PT Time Calculation (min) 43 min    Activity Tolerance Patient tolerated treatment well    Behavior During Therapy [facility] for tasks assessed/performed             Past Medical History:  Diagnosis Date   Diabetes mellitus without complication ([place])    Endometrial adenocarcinoma ([place])    GERD (gastroesophageal reflux disease)    Hematochezia    Hyperlipidemia    Hypertension    IDA (iron deficiency anemia)    Inguinal lymphadenopathy 07/13/2015   Irritable bowel syndrome    Obesity    Osteoarthritis    Sleep apnea     Past Surgical History:  Procedure Laterality Date   ABDOMINAL HYSTERECTOMY  feb 2013   COLONOSCOPY WITH PROPOFOL N/A 08/02/2015   Procedure: COLONOSCOPY WITH PROPOFOL;  Surgeon: Manya Silvas, MD;  Location: [place];  Service: Endoscopy;  Laterality: N/A;   ESOPHAGOGASTRODUODENOSCOPY (EGD) WITH PROPOFOL N/A 08/02/2015   Procedure: ESOPHAGOGASTRODUODENOSCOPY (EGD) WITH PROPOFOL;  Surgeon: Manya Silvas, MD;  Location: [facility] ENDOSCOPY;  Service: Endoscopy;  Laterality: N/A;    There were no vitals filed for this visit.   Subjective Assessment - 08/09/20 1415     Subjective Patient reports her R knee pain is 7/10 today. She reports active participation in HEP but says "I liked to have fell down" while attempting theraband exercise.    Pertinent History Pt is a 60 year old female familiar with this clinic  with chronic pain of R knee. Cannot remember exactly when it started, was seen at this clinic for this clinic for R knee pain a couple years ago. Her pain is diffuse "all around the knee" with most pain at back of the knee. Describes pain as achy and dull, with occassional sharp pain. No N/T or radiation to ankle or hip. Aggravated by squatting, lifting, sitting >40mins, walking >69mins, standing >64mins. Has pain with community ambulation and is unable to run errands d/t this. Reports pain is 7/10 currently, 9/10 at the worst, and 6/10 at best. Pt and her sister are trying to lose weight to help with this pain. Pt denies N/V, B&B changes, unexplained weight fluctuation, saddle paresthesia, fever, night sweats, or unrelenting night pain at this time. 1 fall in the past 6 months when her R knee "went out on her" and she didn't have her cane in her room, and was able to get up with help from her aid.    Limitations House hold activities;Lifting;Walking    How long can you sit comfortably? 18min    How long can you stand comfortably? 70min    How long can you walk comfortably? 57min    Diagnostic tests none recently    Patient Stated Goals decrease pain, make my legs stronger  Therapeutic Exercise:  Nu Step x 5 min level 3  Standing Leg ABD 3 x 12 reps green theraband Omega Leg press 1x 12 reps 55#, 1x 12 reps 65#, 1 x12 reps 75# Trx Squats 2 x 10 reps FWD/LAT Step downs: 2 x 10 reps                          PT Education - 08/09/20 1425     Education Details Pt educated on proper form and safety when performing theraband exercise. Pt educated hold onto sturdy surface (countertop or railing).    Person(s) Educated Patient    Methods Explanation;Demonstration    Comprehension Verbalized understanding;Returned demonstration              PT Short Term Goals - 07/31/20 1650       PT SHORT TERM GOAL #1   Title Pt will be independent with HEP in order to  decrease ankle pain and increase strength in order to improve pain-free function at home and work.    Baseline 07/31/20    Time 4    Period Weeks    Status New               PT Long Term Goals - 07/31/20 1650       PT LONG TERM GOAL #1   Title Pt will decrease worst knee pain on NPRS in order to demonstrate clinically significant reduction in pain    Baseline 07/31/20 9/10    Time 8    Period Weeks    Status New      PT LONG TERM GOAL #2   Title Pt will demonstrate full AROM of the R knee in order to demonstrate full mobility to perform household ADLs and community activity    Baseline 07/31/20 13 - 105d    Time 8    Period Weeks    Status New      PT LONG TERM GOAL #3   Title Patient will increase FOTO score to 63 to demonstrate predicted increase in functional mobility to complete ADLs    Baseline 07/31/20 56    Time 8    Period Weeks    Status New      PT LONG TERM GOAL #4   Title Pt will decrease 5TSTS by to least 12 seconds in order to demonstrate clinically significant improvement in LE strength    Baseline 07/31/20 22sec    Time 8    Period Weeks    Status New      PT LONG TERM GOAL #5   Title Pt will demosntrate gait speed of at least 1.61m/s to demonstrate appropriate gait speed for community activity    Baseline 07/31/20 0.37m/s    Time 8    Period Weeks    Status New      PT LONG TERM GOAL #6   Title Pt will increase 6MWT by at least 51m (166ft) in order to demonstrate clinically significant improvement in cardiopulmonary endurance and community ambulation    Baseline 07/31/20 678ft    Time 8    Period Weeks    Status New                   Plan - 08/09/20 1509     Clinical Impression Statement Patient tolerated session well evidenced by reported decrease in pain to 5/10 following exercise. Pt continues to need skilled therapy in order to ensure safe, proper progression  to achieve set goals. Pt required tactile/verbal cueing when performing  exercises. Plan to continue LE strengthening next session.    Personal Factors and Comorbidities Fitness;Comorbidity 1;Past/Current Experience;Time since onset of injury/illness/exacerbation;Transportation    Examination-Activity Limitations Lift;Caring for Others;Carry;Bed Mobility;Squat;Stairs;Stand;Transfers    Examination-Participation Restrictions Community Activity;Cleaning;Laundry;Meal Prep    Stability/Clinical Decision Making Evolving/Moderate complexity    Clinical Decision Making Moderate    Rehab Potential Fair    PT Frequency 2x / week    PT Duration 8 weeks    PT Treatment/Interventions Neuromuscular re-education;Passive range of motion;Manual techniques;Patient/family education;Taping;Therapeutic exercise;Therapeutic activities;Functional mobility training;Moist Heat;Ultrasound;Cryotherapy;Electrical Stimulation;Dry needling;Balance training;Stair training;Gait training;ADLs/Self Care Home Management;Aquatic Therapy;Spinal Manipulations;Joint Manipulations;DME Instruction;Iontophoresis 4mg /ml Dexamethasone;Traction    PT Next Visit Plan Step up and step downs with eccentric emphasis.    PT Home Exercise Plan STS, standing hip abd, walk daily    Consulted and Agree with Plan of Care Patient             Patient will benefit from skilled therapeutic intervention in order to improve the following deficits and impairments:  Abnormal gait, Pain, Improper body mechanics, Postural dysfunction, Decreased activity tolerance, Decreased endurance, Decreased range of motion, Decreased strength, Decreased balance, Difficulty walking, Impaired flexibility, Hypomobility, Decreased mobility, Impaired UE functional use, Increased fascial restricitons, Impaired tone, Increased muscle spasms, Obesity  Visit Diagnosis: Chronic pain of right knee     Problem List Patient Active Problem List   Diagnosis Date Noted   Lymphedema 01/17/2016   Venous (peripheral) insufficiency 01/17/2016   Pain  in limb 01/17/2016   Iron deficiency anemia due to chronic blood loss 01/09/2016   History of endometrial cancer 08/01/2015   Endometrial adenocarcinoma ([place]) 07/13/2015    Class: History of   Inguinal lymphadenopathy 07/13/2015   Long term current use of insulin ([place]) 12/12/2013   Microalbuminuria 12/12/2013   Diabetes ([place]) 07/28/2013   BP (high blood pressure) 07/28/2013   HLD (hyperlipidemia) 07/28/2013   Adiposity 07/28/2013   Obstructive apnea 07/28/2013   Arthritis, degenerative 07/28/2013   Apnea, sleep 07/28/2013   Thyroid nodule 07/28/2013    Durwin Reges DPT Sharion Settler, SPT Sharion Settler 08/09/2020, 4:34 PM  [facility] [address], [place], [address] Phone: [phone]   Fax:  [phone]  Name: Palmira Stickle MRN: [id number] Date of Birth: Apr 07, 1960

## 2020-08-14 ENCOUNTER — Ambulatory Visit: Payer: Medicare Other | Admitting: Physical Therapy

## 2020-08-16 ENCOUNTER — Encounter: Payer: Medicare Other | Admitting: Physical Therapy

## 2020-08-21 ENCOUNTER — Ambulatory Visit: Payer: Medicare Other | Admitting: Physical Therapy

## 2020-08-21 DIAGNOSIS — M25561 Pain in right knee: Secondary | ICD-10-CM | POA: Diagnosis not present

## 2020-08-21 DIAGNOSIS — G8929 Other chronic pain: Secondary | ICD-10-CM

## 2020-08-21 NOTE — Therapy (Signed)
[place] [facility] [address] [place], [address] Phone: [phone]   Fax:  [phone]  Physical Therapy Treatment  Patient Details  Name: Christine Lawson MRN: [id number] Date of Birth: 1960-04-19 No data recorded  Encounter Date: 08/21/2020   PT End of Session - 08/21/20 1503     Visit Number 4    Number of Visits 17    Date for PT Re-Evaluation 09/28/20    Authorization - Visit Number 4    Authorization - Number of Visits 10    PT Start Time 1430    PT Stop Time 1508    PT Time Calculation (min) 38 min    Equipment Utilized During Treatment Gait belt    Activity Tolerance Patient tolerated treatment well    Behavior During Therapy [facility] for tasks assessed/performed             Past Medical History:  Diagnosis Date   Diabetes mellitus without complication ([place])    Endometrial adenocarcinoma ([place])    GERD (gastroesophageal reflux disease)    Hematochezia    Hyperlipidemia    Hypertension    IDA (iron deficiency anemia)    Inguinal lymphadenopathy 07/13/2015   Irritable bowel syndrome    Obesity    Osteoarthritis    Sleep apnea     Past Surgical History:  Procedure Laterality Date   ABDOMINAL HYSTERECTOMY  feb 2013   COLONOSCOPY WITH PROPOFOL N/A 08/02/2015   Procedure: COLONOSCOPY WITH PROPOFOL;  Surgeon: Manya Silvas, MD;  Location: [place];  Service: Endoscopy;  Laterality: N/A;   ESOPHAGOGASTRODUODENOSCOPY (EGD) WITH PROPOFOL N/A 08/02/2015   Procedure: ESOPHAGOGASTRODUODENOSCOPY (EGD) WITH PROPOFOL;  Surgeon: Manya Silvas, MD;  Location: [facility];  Service: Endoscopy;  Laterality: N/A;    There were no vitals filed for this visit.   Subjective Assessment - 08/21/20 1435     Subjective Patient reports her R knee pain 6.5/10 today. She states she had some tightness in R LE after last session for a couple of days.    Pertinent History Pt is a 60 year old female familiar  with this clinic with chronic pain of R knee. Cannot remember exactly when it started, was seen at this clinic for this clinic for R knee pain a couple years ago. Her pain is diffuse "all around the knee" with most pain at back of the knee. Describes pain as achy and dull, with occassional sharp pain. No N/T or radiation to ankle or hip. Aggravated by squatting, lifting, sitting >26mins, walking >22mins, standing >50mins. Has pain with community ambulation and is unable to run errands d/t this. Reports pain is 7/10 currently, 9/10 at the worst, and 6/10 at best. Pt and her sister are trying to lose weight to help with this pain. Pt denies N/V, B&B changes, unexplained weight fluctuation, saddle paresthesia, fever, night sweats, or unrelenting night pain at this time. 1 fall in the past 6 months when her R knee "went out on her" and she didn't have her cane in her room, and was able to get up with help from her aid.    Limitations House hold activities;Lifting;Walking               Therapeutic Exercise  Nu Step x 5 min level 4 seat 8 LE only STS 2 x 10 reps cues to limit rocking Omega Leg press 1x 12 reps 55#, 1x 12 reps 65#, 1 x12 reps 75# cueing to move  slowly Trx Squats 2 x 10 reps Calf Raises 2 x 12 cues to limit UE use, rocking, and to lower slowly Standing Leg ABD 3 x 10 cues to limit swaying                         PT Education - 08/21/20 1547     Education Details Pt educated on proper form and safet with therex.    Person(s) Educated Patient    Methods Explanation;Demonstration    Comprehension Verbalized understanding;Returned demonstration              PT Short Term Goals - 07/31/20 1650       PT SHORT TERM GOAL #1   Title Pt will be independent with HEP in order to decrease ankle pain and increase strength in order to improve pain-free function at home and work.    Baseline 07/31/20    Time 4    Period Weeks    Status New               PT  Long Term Goals - 07/31/20 1650       PT LONG TERM GOAL #1   Title Pt will decrease worst knee pain on NPRS in order to demonstrate clinically significant reduction in pain    Baseline 07/31/20 9/10    Time 8    Period Weeks    Status New      PT LONG TERM GOAL #2   Title Pt will demonstrate full AROM of the R knee in order to demonstrate full mobility to perform household ADLs and community activity    Baseline 07/31/20 13 - 105d    Time 8    Period Weeks    Status New      PT LONG TERM GOAL #3   Title Patient will increase FOTO score to 63 to demonstrate predicted increase in functional mobility to complete ADLs    Baseline 07/31/20 56    Time 8    Period Weeks    Status New      PT LONG TERM GOAL #4   Title Pt will decrease 5TSTS by to least 12 seconds in order to demonstrate clinically significant improvement in LE strength    Baseline 07/31/20 22sec    Time 8    Period Weeks    Status New      PT LONG TERM GOAL #5   Title Pt will demosntrate gait speed of at least 1.73m/s to demonstrate appropriate gait speed for community activity    Baseline 07/31/20 0.44m/s    Time 8    Period Weeks    Status New      PT LONG TERM GOAL #6   Title Pt will increase 6MWT by at least 37m (115ft) in order to demonstrate clinically significant improvement in cardiopulmonary endurance and community ambulation    Baseline 07/31/20 647ft    Time 8    Period Weeks    Status New                   Plan - 08/21/20 1548     Clinical Impression Statement Patient tolerated session well evidenced by no increase in pain throughout session. Pt continues to demonstrate R LE weakness, decreased ROM, and decreased activity tolerance. Pt required continuous multimodal cueing to ensure proper form and technique. Pt will continue to benefit from skilled PT. Continue PT POC.    Personal Factors and Comorbidities Fitness;Comorbidity  1;Past/Current Experience;Time since onset of  injury/illness/exacerbation;Transportation    Comorbidities obesity    Examination-Activity Limitations Lift;Caring for Others;Carry;Bed Mobility;Squat;Stairs;Stand;Transfers    Clinical Impairments Affecting Rehab Potential (-) transportation, multiple comorbidities (DM II, obesity, GERD, communication barrier, sedentary lifestyle (+) positive attitude,     PT Frequency 2x / week    PT Duration 8 weeks    PT Treatment/Interventions Neuromuscular re-education;Passive range of motion;Manual techniques;Patient/family education;Taping;Therapeutic exercise;Therapeutic activities;Functional mobility training;Moist Heat;Ultrasound;Cryotherapy;Electrical Stimulation;Dry needling;Balance training;Stair training;Gait training;ADLs/Self Care Home Management;Aquatic Therapy;Spinal Manipulations;Joint Manipulations;DME Instruction;Iontophoresis 4mg /ml Dexamethasone;Traction    PT Next Visit Plan Step up and step downs with eccentric emphasis.    PT Home Exercise Plan STS, standing hip abd, walk daily             Patient will benefit from skilled therapeutic intervention in order to improve the following deficits and impairments:  Abnormal gait, Pain, Improper body mechanics, Postural dysfunction, Decreased activity tolerance, Decreased endurance, Decreased range of motion, Decreased strength, Decreased balance, Difficulty walking, Impaired flexibility, Hypomobility, Decreased mobility, Impaired UE functional use, Increased fascial restricitons, Impaired tone, Increased muscle spasms, Obesity  Visit Diagnosis: Chronic pain of right knee     Problem List Patient Active Problem List   Diagnosis Date Noted   Lymphedema 01/17/2016   Venous (peripheral) insufficiency 01/17/2016   Pain in limb 01/17/2016   Iron deficiency anemia due to chronic blood loss 01/09/2016   History of endometrial cancer 08/01/2015   Endometrial adenocarcinoma ([place]) 07/13/2015    Class: History of   Inguinal lymphadenopathy  07/13/2015   Long term current use of insulin ([place]) 12/12/2013   Microalbuminuria 12/12/2013   Diabetes ([place]) 07/28/2013   BP (high blood pressure) 07/28/2013   HLD (hyperlipidemia) 07/28/2013   Adiposity 07/28/2013   Obstructive apnea 07/28/2013   Arthritis, degenerative 07/28/2013   Apnea, sleep 07/28/2013   Thyroid nodule 07/28/2013    Durwin Reges DPT Sharion Settler, SPT  Durwin Reges 08/22/2020, 9:32 AM  [place] [facility] [address], [place], [address] Phone: [phone]   Fax:  [phone]  Name: Christine Lawson MRN: [id number] Date of Birth: [birth year]

## 2020-08-22 ENCOUNTER — Encounter: Payer: Self-pay | Admitting: Physical Therapy

## 2020-08-23 ENCOUNTER — Encounter: Payer: Self-pay | Admitting: Physical Therapy

## 2020-08-23 ENCOUNTER — Ambulatory Visit: Payer: Medicare Other | Admitting: Physical Therapy

## 2020-08-23 ENCOUNTER — Encounter: Payer: Medicare Other | Admitting: Physical Therapy

## 2020-08-23 DIAGNOSIS — M25561 Pain in right knee: Secondary | ICD-10-CM | POA: Diagnosis not present

## 2020-08-23 DIAGNOSIS — G8929 Other chronic pain: Secondary | ICD-10-CM

## 2020-08-23 NOTE — Therapy (Signed)
[place] [facility] [address], [place], [address] Phone: [phone]   Fax:  [phone]  Physical Therapy Treatment  Patient Details  Name: Christine Lawson MRN: [id number] Date of Birth: [birth year] No data recorded  Encounter Date: 08/23/2020   PT End of Session - 08/23/20 1345     Visit Number 5    Number of Visits 17    Date for PT Re-Evaluation 09/28/20    Authorization - Visit Number 5    Authorization - Number of Visits 10    PT Start Time 1300    PT Stop Time 9381    PT Time Calculation (min) 45 min    Activity Tolerance Patient tolerated treatment well    Behavior During Therapy [facility] for tasks assessed/performed             Past Medical History:  Diagnosis Date   Diabetes mellitus without complication ([place])    Endometrial adenocarcinoma ([place])    GERD (gastroesophageal reflux disease)    Hematochezia    Hyperlipidemia    Hypertension    IDA (iron deficiency anemia)    Inguinal lymphadenopathy 07/13/2015   Irritable bowel syndrome    Obesity    Osteoarthritis    Sleep apnea     Past Surgical History:  Procedure Laterality Date   ABDOMINAL HYSTERECTOMY  feb 2013   COLONOSCOPY WITH PROPOFOL N/A 08/02/2015   Procedure: COLONOSCOPY WITH PROPOFOL;  Surgeon: Manya Silvas, MD;  Location: [place];  Service: Endoscopy;  Laterality: N/A;   ESOPHAGOGASTRODUODENOSCOPY (EGD) WITH PROPOFOL N/A 08/02/2015   Procedure: ESOPHAGOGASTRODUODENOSCOPY (EGD) WITH PROPOFOL;  Surgeon: Manya Silvas, MD;  Location: [facility] ENDOSCOPY;  Service: Endoscopy;  Laterality: N/A;    There were no vitals filed for this visit.   Subjective Assessment - 08/23/20 1259     Subjective Patient reports her R knee pain is 7.5/10 today. She states she has been a little more active than ususal.    Pertinent History Pt is a 60 year old female familiar with this clinic with chronic pain of R knee. Cannot remember exactly  when it started, was seen at this clinic for this clinic for R knee pain a couple years ago. Her pain is diffuse "all around the knee" with most pain at back of the knee. Describes pain as achy and dull, with occassional sharp pain. No N/T or radiation to ankle or hip. Aggravated by squatting, lifting, sitting >74mins, walking >51mins, standing >42mins. Has pain with community ambulation and is unable to run errands d/t this. Reports pain is 7/10 currently, 9/10 at the worst, and 6/10 at best. Pt and her sister are trying to lose weight to help with this pain. Pt denies N/V, B&B changes, unexplained weight fluctuation, saddle paresthesia, fever, night sweats, or unrelenting night pain at this time. 1 fall in the past 6 months when her R knee "went out on her" and she didn't have her cane in her room, and was able to get up with help from her aid.    Limitations House hold activities;Lifting;Walking    Patient Stated Goals decrease pain, make my legs stronger               Therapeutic Exercise  Nustep L5 x 6 min LE only  Total Gym Squats 2 x 20 reps L26 Leg Press Omega 1 x 10 75#, 1 x 10 # 95, 1 x 10 # 115 cues to toes straight and to perform  slowly  Calf raises 2 x 20 reps on 2x4 board cues to go slowly Standing Hip ABD Matrix 1 x 20 reps Standing marches with 2# AW 2 x 90 sec cueing to march as high as step stool and to lower feet lightly                        PT Education - 08/23/20 1501     Education provided Yes    Education Details therex form/technique    Person(s) Educated Patient    Methods Explanation;Demonstration;Verbal cues    Comprehension Verbalized understanding;Returned demonstration;Verbal cues required              PT Short Term Goals - 07/31/20 1650       PT SHORT TERM GOAL #1   Title Pt will be independent with HEP in order to decrease ankle pain and increase strength in order to improve pain-free function at home and work.    Baseline  07/31/20    Time 4    Period Weeks    Status New               PT Long Term Goals - 07/31/20 1650       PT LONG TERM GOAL #1   Title Pt will decrease worst knee pain on NPRS in order to demonstrate clinically significant reduction in pain    Baseline 07/31/20 9/10    Time 8    Period Weeks    Status New      PT LONG TERM GOAL #2   Title Pt will demonstrate full AROM of the R knee in order to demonstrate full mobility to perform household ADLs and community activity    Baseline 07/31/20 13 - 105d    Time 8    Period Weeks    Status New      PT LONG TERM GOAL #3   Title Patient will increase FOTO score to 63 to demonstrate predicted increase in functional mobility to complete ADLs    Baseline 07/31/20 56    Time 8    Period Weeks    Status New      PT LONG TERM GOAL #4   Title Pt will decrease 5TSTS by to least 12 seconds in order to demonstrate clinically significant improvement in LE strength    Baseline 07/31/20 22sec    Time 8    Period Weeks    Status New      PT LONG TERM GOAL #5   Title Pt will demosntrate gait speed of at least 1.32m/s to demonstrate appropriate gait speed for community activity    Baseline 07/31/20 0.34m/s    Time 8    Period Weeks    Status New      PT LONG TERM GOAL #6   Title Pt will increase 6MWT by at least 12m (117ft) in order to demonstrate clinically significant improvement in cardiopulmonary endurance and community ambulation    Baseline 07/31/20 611ft    Time 8    Period Weeks    Status New                   Plan - 08/23/20 1346     Clinical Impression Statement Patient tolerates session well evidenced by no increase in pain at end of session. PT continued R LE strengthening paired with multimodal cueing to ensure proper form and safety. Continue PT POC    Personal Factors and Comorbidities Fitness;Comorbidity 1;Past/Current  Experience;Time since onset of injury/illness/exacerbation;Transportation    Comorbidities  obesity    Examination-Activity Limitations Lift;Caring for Others;Carry;Bed Mobility;Squat;Stairs;Stand;Transfers    Examination-Participation Restrictions Community Activity;Cleaning;Laundry;Meal Prep    Clinical Impairments Affecting Rehab Potential (-) transportation, multiple comorbidities (DM II, obesity, GERD, communication barrier, sedentary lifestyle (+) positive attitude,     PT Treatment/Interventions Neuromuscular re-education;Passive range of motion;Manual techniques;Patient/family education;Taping;Therapeutic exercise;Therapeutic activities;Functional mobility training;Moist Heat;Ultrasound;Cryotherapy;Electrical Stimulation;Dry needling;Balance training;Stair training;Gait training;ADLs/Self Care Home Management;Aquatic Therapy;Spinal Manipulations;Joint Manipulations;DME Instruction;Iontophoresis 4mg /ml Dexamethasone;Traction    PT Next Visit Plan Step up and step downs with eccentric emphasis.    PT Home Exercise Plan STS, standing hip abd, walk daily             Patient will benefit from skilled therapeutic intervention in order to improve the following deficits and impairments:  Abnormal gait, Pain, Improper body mechanics, Postural dysfunction, Decreased activity tolerance, Decreased endurance, Decreased range of motion, Decreased strength, Decreased balance, Difficulty walking, Impaired flexibility, Hypomobility, Decreased mobility, Impaired UE functional use, Increased fascial restricitons, Impaired tone, Increased muscle spasms, Obesity  Visit Diagnosis: Chronic pain of right knee     Problem List Patient Active Problem List   Diagnosis Date Noted   Lymphedema 01/17/2016   Venous (peripheral) insufficiency 01/17/2016   Pain in limb 01/17/2016   Iron deficiency anemia due to chronic blood loss 01/09/2016   History of endometrial cancer 08/01/2015   Endometrial adenocarcinoma ([place]) 07/13/2015    Class: History of   Inguinal lymphadenopathy 07/13/2015   Long term  current use of insulin ([place]) 12/12/2013   Microalbuminuria 12/12/2013   Diabetes ([place]) 07/28/2013   BP (high blood pressure) 07/28/2013   HLD (hyperlipidemia) 07/28/2013   Adiposity 07/28/2013   Obstructive apnea 07/28/2013   Arthritis, degenerative 07/28/2013   Apnea, sleep 07/28/2013   Thyroid nodule 07/28/2013    Durwin Reges DPT Sharion Settler, SPT  Durwin Reges 08/23/2020, 3:04 PM  [place] [facility] [address] [place], [address] Phone: [phone]   Fax:  [phone]  Name: Christine Lawson MRN: [id number] Date of Birth: [birth year]

## 2020-08-28 ENCOUNTER — Ambulatory Visit: Payer: Medicare Other | Admitting: Physical Therapy

## 2020-08-30 ENCOUNTER — Other Ambulatory Visit: Payer: Self-pay

## 2020-08-30 ENCOUNTER — Ambulatory Visit: Payer: Medicare Other | Admitting: Physical Therapy

## 2020-08-30 ENCOUNTER — Encounter: Payer: Self-pay | Admitting: Physical Therapy

## 2020-08-30 DIAGNOSIS — M25561 Pain in right knee: Secondary | ICD-10-CM | POA: Diagnosis not present

## 2020-08-30 DIAGNOSIS — G8929 Other chronic pain: Secondary | ICD-10-CM

## 2020-08-30 NOTE — Therapy (Signed)
[facility] [address] [place], [address] Phone: [phone]   Fax:  [phone]  Physical Therapy Treatment  Patient Details  Name: Christine Lawson MRN: [id number] Date of Birth: 31-Mar-1960 No data recorded  Encounter Date: 08/30/2020   PT End of Session - 08/30/20 1352     Visit Number 6    Number of Visits 17    Date for PT Re-Evaluation 09/28/20    Authorization - Visit Number 6    Authorization - Number of Visits 10    PT Start Time 7622    PT Stop Time 6333    PT Time Calculation (min) 40 min    Activity Tolerance Patient tolerated treatment well    Behavior During Therapy [facility] for tasks assessed/performed             Past Medical History:  Diagnosis Date   Diabetes mellitus without complication ([place])    Endometrial adenocarcinoma ([place])    GERD (gastroesophageal reflux disease)    Hematochezia    Hyperlipidemia    Hypertension    IDA (iron deficiency anemia)    Inguinal lymphadenopathy 07/13/2015   Irritable bowel syndrome    Obesity    Osteoarthritis    Sleep apnea     Past Surgical History:  Procedure Laterality Date   ABDOMINAL HYSTERECTOMY  feb 2013   COLONOSCOPY WITH PROPOFOL N/A 08/02/2015   Procedure: COLONOSCOPY WITH PROPOFOL;  Surgeon: Manya Silvas, MD;  Location: [facility];  Service: Endoscopy;  Laterality: N/A;   ESOPHAGOGASTRODUODENOSCOPY (EGD) WITH PROPOFOL N/A 08/02/2015   Procedure: ESOPHAGOGASTRODUODENOSCOPY (EGD) WITH PROPOFOL;  Surgeon: Manya Silvas, MD;  Location: [facility] ENDOSCOPY;  Service: Endoscopy;  Laterality: N/A;    There were no vitals filed for this visit.   Subjective Assessment - 08/30/20 1349     Subjective Reports her pain was an 8/10 earlier today after walking a lot yesterday. REports 7/10 pain currently. Is completing her HEP    Pertinent History Pt is a 60 year old female familiar with this clinic with chronic pain of R knee. Cannot  remember exactly when it started, was seen at this clinic for this clinic for R knee pain a couple years ago. Her pain is diffuse "all around the knee" with most pain at back of the knee. Describes pain as achy and dull, with occassional sharp pain. No N/T or radiation to ankle or hip. Aggravated by squatting, lifting, sitting >31mins, walking >55mins, standing >44mins. Has pain with community ambulation and is unable to run errands d/t this. Reports pain is 7/10 currently, 9/10 at the worst, and 6/10 at best. Pt and her sister are trying to lose weight to help with this pain. Pt denies N/V, B&B changes, unexplained weight fluctuation, saddle paresthesia, fever, night sweats, or unrelenting night pain at this time. 1 fall in the past 6 months when her R knee "went out on her" and she didn't have her cane in her room, and was able to get up with help from her aid.    Limitations House hold activities;Lifting;Walking    How long can you sit comfortably? 76min    How long can you stand comfortably? 63min    How long can you walk comfortably? 70min    Diagnostic tests none recently    Patient Stated Goals decrease pain, make my legs stronger    Pain Onset More than a month ago    Pain Onset 1 to 4 weeks  ago             Therapeutic Exercise   Nustep seat setting 8 L5 x 85min LE only for gentle strengthening and motion  Total Gym Squats x20 reps L26 with cuing initially for max ROM Leg Press Omega 3 x 10 # 115 cues to prevent hip ER compensation Squat to chair with BUE flex 3x 6 with power/fast stand, max cuing for set up/technique with use of mirror as well with decent carry over Step up onto 6in step  Calf raises from step 2x 10 with cuing for true                           PT Education - 08/30/20 1350     Education provided Yes    Education Details therex form/technique    Person(s) Educated Patient    Methods Explanation;Demonstration;Verbal cues    Comprehension  Verbalized understanding;Verbal cues required;Returned demonstration              PT Short Term Goals - 07/31/20 1650       PT SHORT TERM GOAL #1   Title Pt will be independent with HEP in order to decrease ankle pain and increase strength in order to improve pain-free function at home and work.    Baseline 07/31/20    Time 4    Period Weeks    Status New               PT Long Term Goals - 07/31/20 1650       PT LONG TERM GOAL #1   Title Pt will decrease worst knee pain on NPRS in order to demonstrate clinically significant reduction in pain    Baseline 07/31/20 9/10    Time 8    Period Weeks    Status New      PT LONG TERM GOAL #2   Title Pt will demonstrate full AROM of the R knee in order to demonstrate full mobility to perform household ADLs and community activity    Baseline 07/31/20 13 - 105d    Time 8    Period Weeks    Status New      PT LONG TERM GOAL #3   Title Patient will increase FOTO score to 63 to demonstrate predicted increase in functional mobility to complete ADLs    Baseline 07/31/20 56    Time 8    Period Weeks    Status New      PT LONG TERM GOAL #4   Title Pt will decrease 5TSTS by to least 12 seconds in order to demonstrate clinically significant improvement in LE strength    Baseline 07/31/20 22sec    Time 8    Period Weeks    Status New      PT LONG TERM GOAL #5   Title Pt will demosntrate gait speed of at least 1.5m/s to demonstrate appropriate gait speed for community activity    Baseline 07/31/20 0.81m/s    Time 8    Period Weeks    Status New      PT LONG TERM GOAL #6   Title Pt will increase 6MWT by at least 56m (145ft) in order to demonstrate clinically significant improvement in cardiopulmonary endurance and community ambulation    Baseline 07/31/20 632ft    Time 8    Period Weeks    Status New  Plan - 08/30/20 1403     Clinical Impression Statement PT continued therex progression for  increased strength and mobility with success. PT initated therex for LE power needed for independent transfers as well. Paiten tis able to comply with multimodal cuing for proepr technique of therex, with cuing throughout most of session for posture. Patient is motivated throughout session without increased pain. PT will continue progression as able.    Personal Factors and Comorbidities Fitness;Comorbidity 1;Past/Current Experience;Time since onset of injury/illness/exacerbation;Transportation    Comorbidities obesity    Examination-Activity Limitations Lift;Caring for Others;Carry;Bed Mobility;Squat;Stairs;Stand;Transfers    Examination-Participation Restrictions Community Activity;Cleaning;Laundry;Meal Prep    Stability/Clinical Decision Making Evolving/Moderate complexity    Clinical Decision Making Moderate    Rehab Potential Fair    Clinical Impairments Affecting Rehab Potential (-) transportation, multiple comorbidities (DM II, obesity, GERD, communication barrier, sedentary lifestyle (+) positive attitude,     PT Frequency 2x / week    PT Duration 8 weeks    PT Treatment/Interventions Neuromuscular re-education;Passive range of motion;Manual techniques;Patient/family education;Taping;Therapeutic exercise;Therapeutic activities;Functional mobility training;Moist Heat;Ultrasound;Cryotherapy;Electrical Stimulation;Dry needling;Balance training;Stair training;Gait training;ADLs/Self Care Home Management;Aquatic Therapy;Spinal Manipulations;Joint Manipulations;DME Instruction;Iontophoresis 4mg /ml Dexamethasone;Traction    PT Next Visit Plan Step up and step downs with eccentric emphasis.    PT Home Exercise Plan STS, standing hip abd, walk daily    Consulted and Agree with Plan of Care Patient             Patient will benefit from skilled therapeutic intervention in order to improve the following deficits and impairments:  Abnormal gait, Pain, Improper body mechanics, Postural dysfunction,  Decreased activity tolerance, Decreased endurance, Decreased range of motion, Decreased strength, Decreased balance, Difficulty walking, Impaired flexibility, Hypomobility, Decreased mobility, Impaired UE functional use, Increased fascial restricitons, Impaired tone, Increased muscle spasms, Obesity  Visit Diagnosis: Chronic pain of right knee     Problem List Patient Active Problem List   Diagnosis Date Noted   Lymphedema 01/17/2016   Venous (peripheral) insufficiency 01/17/2016   Pain in limb 01/17/2016   Iron deficiency anemia due to chronic blood loss 01/09/2016   History of endometrial cancer 08/01/2015   Endometrial adenocarcinoma ([place]) 07/13/2015    Class: History of   Inguinal lymphadenopathy 07/13/2015   Long term current use of insulin ([place]) 12/12/2013   Microalbuminuria 12/12/2013   Diabetes ([place]) 07/28/2013   BP (high blood pressure) 07/28/2013   HLD (hyperlipidemia) 07/28/2013   Adiposity 07/28/2013   Obstructive apnea 07/28/2013   Arthritis, degenerative 07/28/2013   Apnea, sleep 07/28/2013   Thyroid nodule 07/28/2013   Durwin Reges DPT Durwin Reges 08/30/2020, 2:27 PM  [facility] [address], [place], [address] Phone: [phone]   Fax:  [phone]  Name: Christine Lawson MRN: [id number] Date of Birth: [birth year]

## 2020-09-04 ENCOUNTER — Ambulatory Visit: Payer: Medicare Other | Attending: Internal Medicine

## 2020-09-04 ENCOUNTER — Other Ambulatory Visit: Payer: Self-pay

## 2020-09-04 DIAGNOSIS — M25561 Pain in right knee: Secondary | ICD-10-CM | POA: Diagnosis not present

## 2020-09-04 DIAGNOSIS — M25612 Stiffness of left shoulder, not elsewhere classified: Secondary | ICD-10-CM | POA: Insufficient documentation

## 2020-09-04 DIAGNOSIS — M25512 Pain in left shoulder: Secondary | ICD-10-CM | POA: Insufficient documentation

## 2020-09-04 DIAGNOSIS — G8929 Other chronic pain: Secondary | ICD-10-CM | POA: Diagnosis present

## 2020-09-04 NOTE — Therapy (Signed)
[facility] [address] [place], [address] Phone: [phone]   Fax:  [phone]  Physical Therapy Treatment  Patient Details  Name: Christine Lawson MRN: [id number] Date of Birth: 04-15-1960 No data recorded  Encounter Date: 09/04/2020   PT End of Session - 09/04/20 1308     Visit Number 7    Number of Visits 17    Date for PT Re-Evaluation 09/28/20    Authorization Type [facility] Medicare/ Medicaid 2nd    Authorization Time Period 07/31/20-09/28/20    PT Start Time 1303    PT Stop Time 1343    PT Time Calculation (min) 40 min    Activity Tolerance Patient tolerated treatment well;No increased pain    Behavior During Therapy [facility] for tasks assessed/performed             Past Medical History:  Diagnosis Date   Diabetes mellitus without complication ([place])    Endometrial adenocarcinoma ([place])    GERD (gastroesophageal reflux disease)    Hematochezia    Hyperlipidemia    Hypertension    IDA (iron deficiency anemia)    Inguinal lymphadenopathy 07/13/2015   Irritable bowel syndrome    Obesity    Osteoarthritis    Sleep apnea     Past Surgical History:  Procedure Laterality Date   ABDOMINAL HYSTERECTOMY  feb 2013   COLONOSCOPY WITH PROPOFOL N/A 08/02/2015   Procedure: COLONOSCOPY WITH PROPOFOL;  Surgeon: Manya Silvas, MD;  Location: [facility];  Service: Endoscopy;  Laterality: N/A;   ESOPHAGOGASTRODUODENOSCOPY (EGD) WITH PROPOFOL N/A 08/02/2015   Procedure: ESOPHAGOGASTRODUODENOSCOPY (EGD) WITH PROPOFOL;  Surgeon: Manya Silvas, MD;  Location: [facility] ENDOSCOPY;  Service: Endoscopy;  Laterality: N/A;    There were no vitals filed for this visit.   Subjective Assessment - 09/04/20 1307     Subjective Pt doing well today, took pain medication prior to arrival, reports her pain is 7/10. Pt reports HEP is going well, no questions.    Pertinent History Pt is a 60 year old female familiar with this  clinic with chronic pain of R knee. Cannot remember exactly when it started, was seen at this clinic for this clinic for R knee pain a couple years ago. Her pain is diffuse "all around the knee" with most pain at back of the knee. Describes pain as achy and dull, with occassional sharp pain. No N/T or radiation to ankle or hip. Aggravated by squatting, lifting, sitting >29mins, walking >51mins, standing >73mins. Has pain with community ambulation and is unable to run errands d/t this. Reports pain is 7/10 currently, 9/10 at the worst, and 6/10 at best. Pt and her sister are trying to lose weight to help with this pain. Pt denies N/V, B&B changes, unexplained weight fluctuation, saddle paresthesia, fever, night sweats, or unrelenting night pain at this time. 1 fall in the past 6 months when her R knee "went out on her" and she didn't have her cane in her room, and was able to get up with help from her aid.    Currently in Pain? Yes    Pain Score 7     Pain Location Knee              INTERVENTION THIS DATE:   -Nustep seat setting 8 L5 x 24min BLE only for AA/ROM and joint nutrition -Total Gym Squats 2x20 reps L26 with cuing initially for max ROM -Leg Press Omega 1x20 # 115  3rd  hole, heels at bottom of plate -Leg Press Omega 1x20 # 115  4th hole, heels at bottom of plate -Leg Press Omega 1x15 # 115  5th hole, heels at bottom of plate -Calf raises from step 2x15, BUE support on treadmill  -FWD step-ups 2x10 bilat, 6" step, BUE support Extensive cues for centralized foot placement Pt has reluctant eccentric flexion of Rt knee until LLE is fully on ground, extensive cues for correction, but fatigue and limited confidence make this correction difficult/limited.  -Rt SLS on 6" step, LLE posterior toe tap, cues to avoid full LLE weight bearing > TTWB x18, tactile cues at tibial tuberosity and posterior ilium.         PT Short Term Goals - 07/31/20 1650       PT SHORT TERM GOAL #1   Title Pt  will be independent with HEP in order to decrease ankle pain and increase strength in order to improve pain-free function at home and work.    Baseline 07/31/20    Time 4    Period Weeks    Status New               PT Long Term Goals - 07/31/20 1650       PT LONG TERM GOAL #1   Title Pt will decrease worst knee pain on NPRS in order to demonstrate clinically significant reduction in pain    Baseline 07/31/20 9/10    Time 8    Period Weeks    Status New      PT LONG TERM GOAL #2   Title Pt will demonstrate full AROM of the R knee in order to demonstrate full mobility to perform household ADLs and community activity    Baseline 07/31/20 13 - 105d    Time 8    Period Weeks    Status New      PT LONG TERM GOAL #3   Title Patient will increase FOTO score to 63 to demonstrate predicted increase in functional mobility to complete ADLs    Baseline 07/31/20 56    Time 8    Period Weeks    Status New      PT LONG TERM GOAL #4   Title Pt will decrease 5TSTS by to least 12 seconds in order to demonstrate clinically significant improvement in LE strength    Baseline 07/31/20 22sec    Time 8    Period Weeks    Status New      PT LONG TERM GOAL #5   Title Pt will demosntrate gait speed of at least 1.34m/s to demonstrate appropriate gait speed for community activity    Baseline 07/31/20 0.75m/s    Time 8    Period Weeks    Status New      PT LONG TERM GOAL #6   Title Pt will increase 6MWT by at least 57m (19ft) in order to demonstrate clinically significant improvement in cardiopulmonary endurance and community ambulation    Baseline 07/31/20 665ft    Time [place] - 09/04/20 1311     Clinical Impression Statement Continued with current plan of care as laid out in evaluation and recent prior sessions. Pt remains motivated to advance progress toward goals. Rest breaks provided as needed, pt quick to ask when needed. Chief Strategy Officer  maintains all interventions within appropriate  level of intensity as not to purposefully exacerbate pain. Pt does require varying levels of assistance and cuing for completion of exercises for correct form and sometimes due to pain/weakness, difficulty mounting/dismount leg press/Total Gym. Noted reluctance in closed chain knee flexion, difficulty correcting pattern with activity, however weakness precludes recommendation for pattern correction ad lib in ADL mobility. Pt continues to demonstrate progress toward goals AEB progression of some interventions this date either in volume or intensity. No updates to HEP this date.    Personal Factors and Comorbidities Fitness;Comorbidity 1;Past/Current Experience;Time since onset of injury/illness/exacerbation;Transportation    Comorbidities obesity    Examination-Activity Limitations Lift;Caring for Others;Carry;Bed Mobility;Squat;Stairs;Stand;Transfers    Examination-Participation Restrictions Community Activity;Cleaning;Laundry;Meal Prep    Stability/Clinical Decision Making Evolving/Moderate complexity    Clinical Decision Making Moderate    Rehab Potential Fair    Clinical Impairments Affecting Rehab Potential (-) transportation, multiple comorbidities (DM II, obesity, GERD, communication barrier, sedentary lifestyle (+) positive attitude,     PT Frequency 2x / week    PT Duration 8 weeks    PT Treatment/Interventions Neuromuscular re-education;Passive range of motion;Manual techniques;Patient/family education;Taping;Therapeutic exercise;Therapeutic activities;Functional mobility training;Moist Heat;Ultrasound;Cryotherapy;Electrical Stimulation;Dry needling;Balance training;Stair training;Gait training;ADLs/Self Care Home Management;Aquatic Therapy;Spinal Manipulations;Joint Manipulations;DME Instruction;Iontophoresis 4mg /ml Dexamethasone;Traction    PT Next Visit Plan Step up and step downs with eccentric emphasis.    PT Home Exercise Plan STS, standing  hip abd, walk daily    Consulted and Agree with Plan of Care Patient             Patient will benefit from skilled therapeutic intervention in order to improve the following deficits and impairments:  Abnormal gait, Pain, Improper body mechanics, Postural dysfunction, Decreased activity tolerance, Decreased endurance, Decreased range of motion, Decreased strength, Decreased balance, Difficulty walking, Impaired flexibility, Hypomobility, Decreased mobility, Impaired UE functional use, Increased fascial restricitons, Impaired tone, Increased muscle spasms, Obesity  Visit Diagnosis: Chronic pain of right knee  Chronic left shoulder pain  Stiffness of left shoulder, not elsewhere classified     Problem List Patient Active Problem List   Diagnosis Date Noted   Lymphedema 01/17/2016   Venous (peripheral) insufficiency 01/17/2016   Pain in limb 01/17/2016   Iron deficiency anemia due to chronic blood loss 01/09/2016   History of endometrial cancer 08/01/2015   Endometrial adenocarcinoma ([place]) 07/13/2015    Class: History of   Inguinal lymphadenopathy 07/13/2015   Long term current use of insulin ([place]) 12/12/2013   Microalbuminuria 12/12/2013   Diabetes ([place]) 07/28/2013   BP (high blood pressure) 07/28/2013   HLD (hyperlipidemia) 07/28/2013   Adiposity 07/28/2013   Obstructive apnea 07/28/2013   Arthritis, degenerative 07/28/2013   Apnea, sleep 07/28/2013   Thyroid nodule 07/28/2013   1:52 PM, 09/04/20 Etta Grandchild, PT, DPT Physical Therapist - [place] [phone] (Office)   Boniface Goffe C 09/04/2020, 1:15 PM  [facility] [address] [place], [address] Phone: [phone]   Fax:  [phone]  Name: Christine Lawson MRN: [id number] Date of Birth: [birth year]

## 2020-09-06 ENCOUNTER — Ambulatory Visit: Payer: Medicare Other | Admitting: Physical Therapy

## 2020-09-10 ENCOUNTER — Other Ambulatory Visit: Payer: Self-pay

## 2020-09-10 ENCOUNTER — Ambulatory Visit: Payer: Medicare Other

## 2020-09-10 DIAGNOSIS — G8929 Other chronic pain: Secondary | ICD-10-CM

## 2020-09-10 DIAGNOSIS — M25561 Pain in right knee: Secondary | ICD-10-CM | POA: Diagnosis not present

## 2020-09-10 NOTE — Therapy (Signed)
[facility] [address], [place], [address] Phone: [phone]   Fax:  [phone]  Physical Therapy Treatment  Patient Details  Name: Christine Lawson MRN: [id number] Date of Birth: 1960-11-03 No data recorded  Encounter Date: 09/10/2020   PT End of Session - 09/10/20 1308     Visit Number 8    Number of Visits 17    Date for PT Re-Evaluation 09/28/20    Authorization Type [facility] Medicare/ Medicaid 2nd    Authorization Time Period 07/31/20-09/28/20    Authorization - Visit Number 8    PT Start Time 7408    PT Stop Time 1344    PT Time Calculation (min) 41 min    Activity Tolerance Patient tolerated treatment well;No increased pain    Behavior During Therapy WFL for tasks assessed/performed             Past Medical History:  Diagnosis Date   Diabetes mellitus without complication ([place])    Endometrial adenocarcinoma ([place])    GERD (gastroesophageal reflux disease)    Hematochezia    Hyperlipidemia    Hypertension    IDA (iron deficiency anemia)    Inguinal lymphadenopathy 07/13/2015   Irritable bowel syndrome    Obesity    Osteoarthritis    Sleep apnea     Past Surgical History:  Procedure Laterality Date   ABDOMINAL HYSTERECTOMY  feb 2013   COLONOSCOPY WITH PROPOFOL N/A 08/02/2015   Procedure: COLONOSCOPY WITH PROPOFOL;  Surgeon: Manya Silvas, MD;  Location: [facility];  Service: Endoscopy;  Laterality: N/A;   ESOPHAGOGASTRODUODENOSCOPY (EGD) WITH PROPOFOL N/A 08/02/2015   Procedure: ESOPHAGOGASTRODUODENOSCOPY (EGD) WITH PROPOFOL;  Surgeon: Manya Silvas, MD;  Location: [facility] ENDOSCOPY;  Service: Endoscopy;  Laterality: N/A;    There were no vitals filed for this visit.   Subjective Assessment - 09/10/20 1306     Subjective Pt reports 7.5/10 NPS in R knee today. Mild pain reported in L thigh but otherwise doing well.    Pertinent History Pt is a 60 year old female familiar with this  clinic with chronic pain of R knee. Cannot remember exactly when it started, was seen at this clinic for this clinic for R knee pain a couple years ago. Her pain is diffuse "all around the knee" with most pain at back of the knee. Describes pain as achy and dull, with occassional sharp pain. No N/T or radiation to ankle or hip. Aggravated by squatting, lifting, sitting >46mins, walking >83mins, standing >81mins. Has pain with community ambulation and is unable to run errands d/t this. Reports pain is 7/10 currently, 9/10 at the worst, and 6/10 at best. Pt and her sister are trying to lose weight to help with this pain. Pt denies N/V, B&B changes, unexplained weight fluctuation, saddle paresthesia, fever, night sweats, or unrelenting night pain at this time. 1 fall in the past 6 months when her R knee "went out on her" and she didn't have her cane in her room, and was able to get up with help from her aid.    Currently in Pain? Yes    Pain Score --   7.5   Pain Location Knee    Pain Orientation Right;Posterior;Anterior    Pain Descriptors / Indicators Aching;Dull             There.ex:   Nu-Step L5 for 6 min for warm up. 3 min on Seat level 8 and 3 min on seat  level 7 to improve R knee flexion AROM. Not billed.   Total Gym Squats 2x20 reps L26 with cuing initially for max ROM and improving eccentric control with descent  Heel raises with MTP's of feet on 2x4 wooden beam: 3x15 with BUE support. Initial VC's for keeping knees extended and asc upward instead of anteriorly.  Seated, passive R quad stretch due to reports of tightness in quad: 3x45 sec bouts   Seated knee flexion/extension ball roll outs on blue med ball for R knee AROM: x20/direction. VC's for decreasing speed to optimize ROM.    Upon standing for next exercise pt reports R hamstring cramp and muscle spasm and requested a seat. X10 gentle A/AROM for R hip flexion and x10 A/AROM R knee flexion/extension to improve cramping. Passive R  gastroc stretch: 2x20 sec   X1 lap in gym with reports of easing in hamstring cramp.     Pain from 7.5/10 NPS to 5/10 NPS.    PT Education - 09/10/20 1307     Education provided Yes    Education Details form/technique with exercise.    Person(s) Educated Patient    Methods Explanation;Demonstration;Verbal cues    Comprehension Verbalized understanding;Returned demonstration;Verbal cues required              PT Short Term Goals - 07/31/20 1650       PT SHORT TERM GOAL #1   Title Pt will be independent with HEP in order to decrease ankle pain and increase strength in order to improve pain-free function at home and work.    Baseline 07/31/20    Time 4    Period Weeks    Status New               PT Long Term Goals - 07/31/20 1650       PT LONG TERM GOAL #1   Title Pt will decrease worst knee pain on NPRS in order to demonstrate clinically significant reduction in pain    Baseline 07/31/20 9/10    Time 8    Period Weeks    Status New      PT LONG TERM GOAL #2   Title Pt will demonstrate full AROM of the R knee in order to demonstrate full mobility to perform household ADLs and community activity    Baseline 07/31/20 13 - 105d    Time 8    Period Weeks    Status New      PT LONG TERM GOAL #3   Title Patient will increase FOTO score to 63 to demonstrate predicted increase in functional mobility to complete ADLs    Baseline 07/31/20 56    Time 8    Period Weeks    Status New      PT LONG TERM GOAL #4   Title Pt will decrease 5TSTS by to least 12 seconds in order to demonstrate clinically significant improvement in LE strength    Baseline 07/31/20 22sec    Time 8    Period Weeks    Status New      PT LONG TERM GOAL #5   Title Pt will demosntrate gait speed of at least 1.52m/s to demonstrate appropriate gait speed for community activity    Baseline 07/31/20 0.95m/s    Time 8    Period Weeks    Status New      PT LONG TERM GOAL #6   Title Pt will increase  6MWT by at least 74m (176ft) in order to demonstrate  clinically significant improvement in cardiopulmonary endurance and community ambulation    Baseline 07/31/20 615ft    Time 8    Period Weeks    Status New                   Plan - 09/10/20 1347     Clinical Impression Statement PT continuing to progress LE strengthening and AROM exercises. Session slightly limited at the end today due to pt reports of hamstring cramp after seated R knee flexion exercises. Pt had significant reduction in pain in session though from 7.5/10 to 5/10 NPS. PT will continue to progress LE strength and AROM exercises to maximize independence with ADL's.    Personal Factors and Comorbidities Fitness;Comorbidity 1;Past/Current Experience;Time since onset of injury/illness/exacerbation;Transportation    Comorbidities obesity    Examination-Activity Limitations Lift;Caring for Others;Carry;Bed Mobility;Squat;Stairs;Stand;Transfers    Examination-Participation Restrictions Community Activity;Cleaning;Laundry;Meal Prep    Stability/Clinical Decision Making Evolving/Moderate complexity    Rehab Potential Fair    Clinical Impairments Affecting Rehab Potential (-) transportation, multiple comorbidities (DM II, obesity, GERD, communication barrier, sedentary lifestyle (+) positive attitude,     PT Frequency 2x / week    PT Duration 8 weeks    PT Treatment/Interventions Neuromuscular re-education;Passive range of motion;Manual techniques;Patient/family education;Taping;Therapeutic exercise;Therapeutic activities;Functional mobility training;Moist Heat;Ultrasound;Cryotherapy;Electrical Stimulation;Dry needling;Balance training;Stair training;Gait training;ADLs/Self Care Home Management;Aquatic Therapy;Spinal Manipulations;Joint Manipulations;DME Instruction;Iontophoresis 4mg /ml Dexamethasone;Traction    PT Next Visit Plan Step up and step downs with eccentric emphasis.    PT Home Exercise Plan STS, standing hip abd,  walk daily    Consulted and Agree with Plan of Care Patient             Patient will benefit from skilled therapeutic intervention in order to improve the following deficits and impairments:  Abnormal gait, Pain, Improper body mechanics, Postural dysfunction, Decreased activity tolerance, Decreased endurance, Decreased range of motion, Decreased strength, Decreased balance, Difficulty walking, Impaired flexibility, Hypomobility, Decreased mobility, Impaired UE functional use, Increased fascial restricitons, Impaired tone, Increased muscle spasms, Obesity  Visit Diagnosis: Chronic pain of right knee     Problem List Patient Active Problem List   Diagnosis Date Noted   Lymphedema 01/17/2016   Venous (peripheral) insufficiency 01/17/2016   Pain in limb 01/17/2016   Iron deficiency anemia due to chronic blood loss 01/09/2016   History of endometrial cancer 08/01/2015   Endometrial adenocarcinoma ([place]) 07/13/2015    Class: History of   Inguinal lymphadenopathy 07/13/2015   Long term current use of insulin ([place]) 12/12/2013   Microalbuminuria 12/12/2013   Diabetes ([place]) 07/28/2013   BP (high blood pressure) 07/28/2013   HLD (hyperlipidemia) 07/28/2013   Adiposity 07/28/2013   Obstructive apnea 07/28/2013   Arthritis, degenerative 07/28/2013   Apnea, sleep 07/28/2013   Thyroid nodule 07/28/2013    Salem Caster. Fairly IV, PT, DPT Physical Therapist- [facility]  09/10/2020, 2:00 PM  [facility] [address] [place], [address] Phone: [phone]   Fax:  [phone]  Name: Christine Lawson MRN: [id number] Date of Birth: 1960/10/15

## 2020-09-12 ENCOUNTER — Encounter: Payer: Medicare Other | Admitting: Physical Therapy

## 2020-09-17 ENCOUNTER — Encounter: Payer: Medicare Other | Admitting: Physical Therapy

## 2020-09-19 ENCOUNTER — Ambulatory Visit: Payer: Medicare Other | Admitting: Physical Therapy

## 2020-09-19 ENCOUNTER — Encounter: Payer: Self-pay | Admitting: Physical Therapy

## 2020-09-19 DIAGNOSIS — M25561 Pain in right knee: Secondary | ICD-10-CM

## 2020-09-19 DIAGNOSIS — G8929 Other chronic pain: Secondary | ICD-10-CM

## 2020-09-19 DIAGNOSIS — M25612 Stiffness of left shoulder, not elsewhere classified: Secondary | ICD-10-CM

## 2020-09-19 NOTE — Therapy (Signed)
[facility] [address], [place], [address] Phone: [phone]   Fax:  [phone]  Physical Therapy Treatment/DC Summary Reporting Period 07/31/20 - 09/19/20  Patient Details  Name: Christine Lawson MRN: [id number] Date of Birth: 03/19/1960 No data recorded  Encounter Date: 09/19/2020   PT End of Session - 09/19/20 1418     Visit Number 9    Number of Visits 17    Date for PT Re-Evaluation 09/28/20    Authorization Type [facility] Medicare/ Medicaid 2nd    Authorization Time Period 07/31/20-09/28/20    Authorization - Visit Number 9    Authorization - Number of Visits 10    PT Start Time 1660    PT Stop Time 1455    PT Time Calculation (min) 30 min    Activity Tolerance Patient tolerated treatment well;No increased pain    Behavior During Therapy WFL for tasks assessed/performed             Past Medical History:  Diagnosis Date   Diabetes mellitus without complication ([place])    Endometrial adenocarcinoma ([place])    GERD (gastroesophageal reflux disease)    Hematochezia    Hyperlipidemia    Hypertension    IDA (iron deficiency anemia)    Inguinal lymphadenopathy 07/13/2015   Irritable bowel syndrome    Obesity    Osteoarthritis    Sleep apnea     Past Surgical History:  Procedure Laterality Date   ABDOMINAL HYSTERECTOMY  feb 2013   COLONOSCOPY WITH PROPOFOL N/A 08/02/2015   Procedure: COLONOSCOPY WITH PROPOFOL;  Surgeon: Manya Silvas, MD;  Location: [facility];  Service: Endoscopy;  Laterality: N/A;   ESOPHAGOGASTRODUODENOSCOPY (EGD) WITH PROPOFOL N/A 08/02/2015   Procedure: ESOPHAGOGASTRODUODENOSCOPY (EGD) WITH PROPOFOL;  Surgeon: Manya Silvas, MD;  Location: [facility] ENDOSCOPY;  Service: Endoscopy;  Laterality: N/A;    There were no vitals filed for this visit.   Subjective Assessment - 09/19/20 1433     Subjective Reports pain not exceeding 6/10 in the past week. Thinks she has returned  to baseline. Completing HEP with her sister.    Pertinent History Pt is a 60 year old female familiar with this clinic with chronic pain of R knee. Cannot remember exactly when it started, was seen at this clinic for this clinic for R knee pain a couple years ago. Her pain is diffuse "all around the knee" with most pain at back of the knee. Describes pain as achy and dull, with occassional sharp pain. No N/T or radiation to ankle or hip. Aggravated by squatting, lifting, sitting >46mins, walking >81mins, standing >27mins. Has pain with community ambulation and is unable to run errands d/t this. Reports pain is 7/10 currently, 9/10 at the worst, and 6/10 at best. Pt and her sister are trying to lose weight to help with this pain. Pt denies N/V, B&B changes, unexplained weight fluctuation, saddle paresthesia, fever, night sweats, or unrelenting night pain at this time. 1 fall in the past 6 months when her R knee "went out on her" and she didn't have her cane in her room, and was able to get up with help from her aid.    Limitations House hold activities;Lifting;Walking    How long can you sit comfortably? 16min    How long can you stand comfortably? 26min    How long can you walk comfortably? 43min    Diagnostic tests none recently    Patient Stated Goals decrease pain,  make my legs stronger    Pain Onset More than a month ago    Pain Onset 1 to 4 weeks ago              Ther-Ex 5xsts 11sec  PT reviewed the following HEP with patient with patient able to demonstrate a set of the following with min cuing for correction needed. PT educated patient on parameters of therex (how/when to inc/decrease intensity, frequency, rep/set range, stretch hold time, and purpose of therex) with verbalized understanding.   Access Code: [email or web address] Squat with Chair Touch - 1 x daily - 2-3 x weekly - 3 sets - 10 reps Standing Hip Abduction with Resistance at Ankles and Counter Support - 1 x daily - 2-3 x weekly - 3 sets -  10 reps Heel Raises with Counter Support - 1 x daily - 2-3 x weekly - 3 sets - 10 reps Standing March with Counter Support - 1 x daily - 7 x weekly - 3 sets - 10 reps   Not billed 6MWT FOTO                         PT Education - 09/19/20 1418     Education provided Yes    Education Details therex form/technique    Person(s) Educated Patient    Methods Explanation;Demonstration;Verbal cues    Comprehension Verbalized understanding;Returned demonstration;Verbal cues required              PT Short Term Goals - 09/19/20 1419       PT SHORT TERM GOAL #1   Title Pt will be independent with HEP in order to decrease ankle pain and increase strength in order to improve pain-free function at home and work.    Baseline 07/31/20 HEP given 09/19/20 completing HEP without question or concern    Time 4    Period Weeks    Status Achieved               PT Long Term Goals - 09/19/20 1421       PT LONG TERM GOAL #1   Title Pt will decrease worst knee pain on NPRS by 3 pts in order to demonstrate clinically significant reduction in pain    Baseline 07/31/20 9/10 09/19/20 6/10    Time 8    Period Weeks    Status Achieved      PT LONG TERM GOAL #2   Title Pt will demonstrate full AROM of the R knee in order to demonstrate full mobility to perform household ADLs and community activity    Baseline 07/31/20 13 - 105d; 09/19/20 5-116    Time 8    Period Weeks    Status Partially Met      PT LONG TERM GOAL #3   Title Patient will increase FOTO score to 63 to demonstrate predicted increase in functional mobility to complete ADLs    Baseline 07/31/20 56; 09/19/20    Time 8    Period Weeks    Status Achieved      PT LONG TERM GOAL #4   Title Pt will decrease 5TSTS by to least 12 seconds in order to demonstrate clinically significant improvement in LE strength    Baseline 07/31/20 22sec 09/19/20 11sec    Time 8    Period Weeks    Status Achieved      PT LONG TERM  GOAL #5   Title Pt will demosntrate gait speed of at least  1.54m/s to demonstrate appropriate gait speed for community activity    Baseline 07/31/20 0.93m/s; 09/19/20 1.19    Time 8    Period Weeks    Status Achieved      PT LONG TERM GOAL #6   Title Pt will increase 6MWT by at least 86m (146ft) in order to demonstrate clinically significant improvement in cardiopulmonary endurance and community ambulation    Baseline 07/31/20 672ft; 09/19/20 833ft    Time 8    Period Weeks    Status Achieved                   Plan - 09/19/20 1444     Clinical Impression Statement PT reassessed goals this session where patient has met all goals of baseline function to d/c PT to HEP. Patient is able to demonstrate and verbalize understanding of HEP recommendations, and complies with all cuing for proper technique of therex. Pt given clinic contact info should further questions/concerns arise, and printout of HEP. Pt to d/c PT.    Personal Factors and Comorbidities Fitness;Comorbidity 1;Past/Current Experience;Time since onset of injury/illness/exacerbation;Transportation    Comorbidities obesity    Examination-Activity Limitations Lift;Caring for Others;Carry;Bed Mobility;Squat;Stairs;Stand;Transfers    Examination-Participation Restrictions Community Activity;Cleaning;Laundry;Meal Prep    Stability/Clinical Decision Making Evolving/Moderate complexity    Clinical Decision Making Moderate    Rehab Potential Fair    Clinical Impairments Affecting Rehab Potential (-) transportation, multiple comorbidities (DM II, obesity, GERD, communication barrier, sedentary lifestyle (+) positive attitude,     PT Frequency 2x / week    PT Duration 8 weeks    PT Treatment/Interventions Neuromuscular re-education;Passive range of motion;Manual techniques;Patient/family education;Taping;Therapeutic exercise;Therapeutic activities;Functional mobility training;Moist Heat;Ultrasound;Cryotherapy;Electrical Stimulation;Dry  needling;Balance training;Stair training;Gait training;ADLs/Self Care Home Management;Aquatic Therapy;Spinal Manipulations;Joint Manipulations;DME Instruction;Iontophoresis 4mg /ml Dexamethasone;Traction    PT Next Visit Plan Step up and step downs with eccentric emphasis.    PT Home Exercise Plan STS, standing hip abd, walk daily    Consulted and Agree with Plan of Care Patient             Patient will benefit from skilled therapeutic intervention in order to improve the following deficits and impairments:  Abnormal gait, Pain, Improper body mechanics, Postural dysfunction, Decreased activity tolerance, Decreased endurance, Decreased range of motion, Decreased strength, Decreased balance, Difficulty walking, Impaired flexibility, Hypomobility, Decreased mobility, Impaired UE functional use, Increased fascial restricitons, Impaired tone, Increased muscle spasms, Obesity  Visit Diagnosis: Chronic pain of right knee  Chronic left shoulder pain  Stiffness of left shoulder, not elsewhere classified     Problem List Patient Active Problem List   Diagnosis Date Noted   Lymphedema 01/17/2016   Venous (peripheral) insufficiency 01/17/2016   Pain in limb 01/17/2016   Iron deficiency anemia due to chronic blood loss 01/09/2016   History of endometrial cancer 08/01/2015   Endometrial adenocarcinoma ([place]) 07/13/2015    Class: History of   Inguinal lymphadenopathy 07/13/2015   Long term current use of insulin ([place]) 12/12/2013   Microalbuminuria 12/12/2013   Diabetes ([place]) 07/28/2013   BP (high blood pressure) 07/28/2013   HLD (hyperlipidemia) 07/28/2013   Adiposity 07/28/2013   Obstructive apnea 07/28/2013   Arthritis, degenerative 07/28/2013   Apnea, sleep 07/28/2013   Thyroid nodule 07/28/2013   Durwin Reges DPT Durwin Reges 09/19/2020, 2:59 PM  [facility] [address] [place], [address] Phone:  [phone]   Fax:  [phone]  Name: Christine Lawson MRN: [id number] Date of Birth:  08/07/1960    

## 2020-09-24 ENCOUNTER — Encounter: Payer: Medicare Other | Admitting: Physical Therapy

## 2020-09-26 ENCOUNTER — Encounter: Payer: Medicare Other | Admitting: Physical Therapy

## 2020-10-17 ENCOUNTER — Ambulatory Visit: Payer: Medicare Other | Admitting: Dietician

## 2020-10-22 ENCOUNTER — Other Ambulatory Visit: Payer: Self-pay | Admitting: Obstetrics and Gynecology

## 2020-10-22 DIAGNOSIS — Z1231 Encounter for screening mammogram for malignant neoplasm of breast: Secondary | ICD-10-CM

## 2020-11-07 ENCOUNTER — Ambulatory Visit: Payer: Medicare Other | Admitting: Dietician

## 2020-12-18 ENCOUNTER — Encounter: Payer: Self-pay | Admitting: Internal Medicine

## 2020-12-31 ENCOUNTER — Encounter: Payer: Self-pay | Admitting: Dietician

## 2020-12-31 ENCOUNTER — Encounter: Payer: Medicare Other | Attending: Internal Medicine | Admitting: Dietician

## 2020-12-31 ENCOUNTER — Other Ambulatory Visit: Payer: Self-pay

## 2020-12-31 VITALS — Ht 63.0 in | Wt 274.0 lb

## 2020-12-31 DIAGNOSIS — Z794 Long term (current) use of insulin: Secondary | ICD-10-CM | POA: Insufficient documentation

## 2020-12-31 DIAGNOSIS — E1142 Type 2 diabetes mellitus with diabetic polyneuropathy: Secondary | ICD-10-CM | POA: Insufficient documentation

## 2020-12-31 DIAGNOSIS — Z6841 Body Mass Index (BMI) 40.0 and over, adult: Secondary | ICD-10-CM | POA: Insufficient documentation

## 2020-12-31 NOTE — Patient Instructions (Addendum)
Try [employer] cereal, or plain, multigrain, or cinnamon cheerios. They are all mostly oat flour. Fairlife milk is lactose free, or you can try Oat milk or almond or other nut milk. For other lactose free ice creams look for something that says "vegan" which means it does not have any dairy milk. Look for "Gluten free" crackers and breads Shallots are milder than onions, but might still bother your stomach. Try Healthy Choice Low Carb Lifestyle frozen dinners Use mostly rice and potatoes for starches with a meal, or grits or oatmeal.

## 2020-12-31 NOTE — Progress Notes (Signed)
Medical Nutrition Therapy: Visit start time: 1330  end time: 1430  Assessment:  Diagnosis: Type 2 diabetes Past medical history: arthritis, HTN, HLD, sleep apnea,  Psychosocial issues/ stress concerns: none    Current weight: 274.0lbs Height: 5'3" BMI: 48.54 Medications, supplements: reconciled list in medical record  Progress and evaluation:  Patient reports she is following diet for GI health. Avoiding pasta, white rice, choosing whole grain bread. No onion, pickles, apples, white bread, watermelon, salad. She is to follow the diet for the next 2 weeks  Reports very low intake of sweets, occasional small portions. Recent HbA1C result (12/19/20) was 7.5% She voices concern over weight and readiness to work on diet changes.  Physical activity: limited due to arthritis pain in knees  Dietary Intake:  Usual eating pattern includes 3 meals and 2-3 snacks per day. Dining out frequency:   Breakfast: grilled cheese [place] bacon and banana or orange; cereal with lactose free milk; 2 small pancakes with sugar free syrup Snack: same as pm Lunch: 10/31 leftover meat loaf with bbq sauce with green beans and brown rice; peanut butter crackers Snack: banana/ orange/ grapes; graham crackers Supper: [place] burger; chicken salad with no pickle or onion + vegetable chips Snack: none or same as pm Beverages: mostly water, sugar free ginger ale  Nutrition Care Education: Topics covered:  Basic nutrition:  appropriate nutrient balance, appropriate meal and snack schedule, general nutrition guidelines    Weight control: importance of low sugar and low fat choices, portion control; role of activity/ exercise and encouraged inclusion of activity as tolerated Diabetes:  appropriate meal and snack schedule, appropriate carb intake and balance, healthy carb choices Hyperlipidemia:  limiting unhealthy fats FODMAP diet: limiting/ avoiding gluten, lactose, some veg and fruits  Nutritional Diagnosis:  [place]-2.2  Altered nutrition-related laboratory As related to diabetes.  As evidenced by elevated HbA1C. [place]-1.4 Altered GI function As related to abdominal pain and diarrhea.  As evidenced by patient reported symptoms. [place]-3.3 Overweight/obesity As related to excess calories and limited activity due to limited mobility and chronic pain.  As evidenced by patient with current BMI of 48.54.  Intervention:  Instruction and discussion as noted above. Patient has been working to follow low FODMAP diet to improve GI symptoms. She voices desire to lose weight and improve her health.  Established additional goals for change with input from patient.  Education Materials given:  Low/ High FODMAP foods lists  grocery list of low FODMAP foods Visit summary with goals/ instructions   Learner/ who was taught:  Patient  Family member: sister Diondra Pines   Level of understanding: Verbalizes/ demonstrates competency   Demonstrated degree of understanding via:   Teach back Learning barriers: None  Willingness to learn/ readiness for change: Eager, change in progress   Monitoring and Evaluation:  Dietary intake, exercise, BG control, GI symptoms, and body weight      follow up:  03/06/21 at 1:30pm

## 2021-01-03 ENCOUNTER — Encounter: Payer: Self-pay | Admitting: Occupational Therapy

## 2021-01-03 ENCOUNTER — Ambulatory Visit: Payer: Medicare Other | Attending: Student | Admitting: Occupational Therapy

## 2021-01-03 DIAGNOSIS — R200 Anesthesia of skin: Secondary | ICD-10-CM | POA: Insufficient documentation

## 2021-01-03 DIAGNOSIS — R202 Paresthesia of skin: Secondary | ICD-10-CM | POA: Insufficient documentation

## 2021-01-03 DIAGNOSIS — G5602 Carpal tunnel syndrome, left upper limb: Secondary | ICD-10-CM | POA: Insufficient documentation

## 2021-01-03 DIAGNOSIS — M79642 Pain in left hand: Secondary | ICD-10-CM | POA: Diagnosis present

## 2021-01-03 DIAGNOSIS — M6281 Muscle weakness (generalized): Secondary | ICD-10-CM | POA: Diagnosis present

## 2021-01-03 NOTE — Therapy (Signed)
[place] [facility] [address], [place], [address] Phone: [phone]   Fax:  [phone]  Occupational Therapy Evaluation  Patient Details  Name: Christine Lawson MRN: [id number] Date of Birth: 22-May-1960 Referring Provider (OT): Cameron Proud- PA   Encounter Date: 01/03/2021   OT End of Session - 01/03/21 2023     Visit Number 1    Number of Visits 8    Date for OT Re-Evaluation 01/31/21    OT Start Time 1315    OT Stop Time 1406    OT Time Calculation (min) 51 min    Activity Tolerance Patient tolerated treatment well    Behavior During Therapy [facility] for tasks assessed/performed             Past Medical History:  Diagnosis Date   Diabetes mellitus without complication ([place])    Endometrial adenocarcinoma ([place])    GERD (gastroesophageal reflux disease)    Hematochezia    Hyperlipidemia    Hypertension    IDA (iron deficiency anemia)    Inguinal lymphadenopathy 07/13/2015   Irritable bowel syndrome    Obesity    Osteoarthritis    Sleep apnea     Past Surgical History:  Procedure Laterality Date   ABDOMINAL HYSTERECTOMY  feb 2013   COLONOSCOPY WITH PROPOFOL N/A 08/02/2015   Procedure: COLONOSCOPY WITH PROPOFOL;  Surgeon: Manya Silvas, MD;  Location: [place];  Service: Endoscopy;  Laterality: N/A;   ESOPHAGOGASTRODUODENOSCOPY (EGD) WITH PROPOFOL N/A 08/02/2015   Procedure: ESOPHAGOGASTRODUODENOSCOPY (EGD) WITH PROPOFOL;  Surgeon: Manya Silvas, MD;  Location: [facility] END[facility]COPY;  Service: Endoscopy;  Laterality: N/A;    There were no vitals filed for this visit.   Subjective Assessment - 01/03/21 2010     Subjective  My symptoms started probably about 3 months ago - my L hand feels tight , and numbness at times - and if I try and use it during day I have pain - 6-8/10 pain when using it    Pertinent History Seen by Ortho PA - L McGhee on 12/10/20 for evaluation of ongoing left shoulder in  addition to the left wrist pain. The patient was evaluated at the walk-in clinic on 11/13/2020. The patient has been experiencing left wrist and forearm pain in addition to pain along the anterior lateral aspect left shoulder for the previous 3 days. The patient denies any recent falls or trauma affecting the left arm. The patient was evaluated for similar pain in both the left shoulder and left wrist in the past after initially a fall. The patient was diagnosed with left frozen shoulder and a left wrist sprain. The patient underwent a left wrist MRI scan and CT scan both which were negative for ligament tears or acute fracture. The patient responded well to physical therapy however over the past several weeks she has noticed increased pain left shoulder and decreased range of motion. She denies any weakness to the left shoulder but does have increased pain when trying to reach above shoulder level. The patient has also been experiencing numbness and tingling in the index and middle fingers and left hand ongoing for the past several weeks as well. This is occurring more frequently during the day, she has been wearing an Ace wrap for support. She denies any surgical history to the left arm. She is right-hand dominant. She does have some increased discomfort when sleeping at night, and the pain in the shoulder is a aching, throbbing discomfort.  The patient has generalized pain throughout the hand with the numbness and tingling present. She denies any catching or locking symptoms in the left hand. The patient did undergo a left forearm and humerus x-rays at the walk-in clinic which were negative for acute fracture, there was some underlying arthritic changes in the shoulder and left wrist as well. The patient was prescribed Celebrex from the internal medicine department which she has been taking routinely.Refer to OT ,  Scheduled for nerve conduction on 01/28/21 -    Patient Stated Goals Want my L hand numbness , pain  better so I can use it around the house    Currently in Pain? Yes    Pain Score 6    8 at the worse   Pain Location Wrist   hand   Pain Orientation Left    Pain Descriptors / Indicators Tightness;Numbness;Aching    Pain Type Acute pain    Pain Onset More than a month ago    Pain Frequency Intermittent               [facility] OT Assessment - 01/03/21 0001       Assessment   Medical Diagnosis L CTS    Referring Provider (OT) Cameron Proud- PA    Onset Date/Surgical Date 10/01/20    Hand Dominance Right    Next MD Visit after nerve conduction test      Home  Environment   Lives With --   Twin sister     Prior Function   Vocation On disability    Leisure On her tablet/IPAD or phone a lot during day      AROM   Right Wrist Extension 50 Degrees    Right Wrist Flexion 40 Degrees    Right Wrist Radial Deviation 20 Degrees    Right Wrist Ulnar Deviation 20 Degrees    Left Wrist Extension 60 Degrees    Left Wrist Flexion 85 Degrees    Left Wrist Radial Deviation 20 Degrees    Left Wrist Ulnar Deviation 25 Degrees      Strength   Right Hand Grip (lbs) 41    Right Hand Lateral Pinch 14 lbs    Right Hand 3 Point Pinch 10 lbs    Left Hand Grip (lbs) 26    Left Hand Lateral Pinch 10 lbs    Left Hand 3 Point Pinch 10 lbs      Right Hand AROM   R Thumb Opposition to Index --   Opposition to base of 5th   R Index  MCP 0-90 70 Degrees    R Index PIP 0-100 90 Degrees    R Long  MCP 0-90 90 Degrees    R Long PIP 0-100 90 Degrees    R Ring  MCP 0-90 90 Degrees    R Ring PIP 0-100 90 Degrees    R Little  MCP 0-90 90 Degrees    R Little PIP 0-100 100 Degrees      Left Hand AROM   L Thumb Opposition to Index --   Opposition to base of 5th - pain   L Index  MCP 0-90 70 Degrees    L Index PIP 0-100 90 Degrees    L Long  MCP 0-90 70 Degrees    L Long PIP 0-100 90 Degrees    L Ring  MCP 0-90 80 Degrees    L Ring PIP 0-100 90 Degrees    L Little  MCP 0-90 80 Degrees  L Little  PIP 0-100 90 Degrees                      OT Treatments/Exercises (OP) - 01/03/21 0001       LUE Contrast Bath   Time 8 minutes    Comments prior to       review of HEP             Pt and sister ed on HEP  Contrast 2 x day - MC spreads 10 reps and webspace massage Thumb PA and RA AROM 10 reps  Tendon glides with focus on extention -and not force flexion - light AROM 10 reps  Light extention wrist stretches and AAROM for wrist RD, UD  12 reps  2 x day  Cont splint as order by ortho       OT Education - 01/03/21 2022     Education Details Findings of eval and HEP    Person(s) Educated Patient   sister   Methods Explanation;Demonstration;Tactile cues;Verbal cues;Handout    Comprehension Verbal cues required;Returned demonstration;Verbalized understanding              OT Short Term Goals - 01/03/21 2030       OT SHORT TERM GOAL #1   Title Pain/numbness  on L wrist/ hand  decrease to less than 2/10 with use during day    Baseline 6-8/10 intermittend    Time 3    Period Weeks    Status New    Target Date 01/24/21      OT SHORT TERM GOAL #2   Title Pt to be ind in HEP to decrease pain , increase AROM in wrist in all planes and increase grip     Baseline no knowledge of HEP - pain 6-8/10 - decrease L  wrist ext 50/ flexion 40    Time 3    Period Weeks    Status New    Target Date 01/24/21               OT Long Term Goals - 01/03/21 2032       OT LONG TERM GOAL #1   Title Pt L grip  strength increase by more than 5 lbs  for pt to use L hand in more than 50% of bathing , turn doorknob without increase symptoms    Baseline pain 6-8/10 with use , strenght grip L 26, R 41 lbs - not using as much    Time 4    Period Weeks    Status New    Target Date 01/31/21      OT LONG TERM GOAL #2   Title L hand numbness improve for pt to have less than 1-2 episodes of numbness a day    Baseline during day with use and just sitting - numbness  reported mostly in thumb and 2nd digit -will do semmes weinstein next session    Time 4    Period Weeks    Status New    Target Date 01/31/21                   Plan - 01/03/21 2024     Clinical Impression Statement Pt refer to OT for L CTS - pt report had symptoms since about 3 months ago -pt report tightness and pain mostly with use -and some numbness at times -pt with diminshed sensation in thumb and 2nd digit more than others - tenderness over CT ,  positive Tinel and Phalen's - pt show decrease grip and lat grip compare to L - increase edema in L hand and  tightness over flexors with decrease ext and flexion of wrist - all limting her functional use of L hand and wrist in ADL's and IADL's - Pt has wrist prefab splint she wears at night time and when out -  pt can benefit from skilled OT services    OT Occupational Profile and History Problem Focused Assessment - Including review of records relating to presenting problem    Occupational performance deficits (Please refer to evaluation for details): ADL's;IADL's;Play;Leisure;Social Participation    Body Structure / Function / Physical Skills ADL;Strength;Decreased knowledge of use of DME;Decreased knowledge of precautions;Flexibility;ROM;IADL;Edema;UE functional use;Pain    Rehab Potential Fair    Clinical Decision Making Limited treatment options, no task modification necessary    Comorbidities Affecting Occupational Performance: None    Modification or Assistance to Complete Evaluation  No modification of tasks or assist necessary to complete eval    OT Frequency 2x / week    OT Duration 4 weeks    OT Treatment/Interventions Self-care/ADL training;Ultrasound;Contrast Bath;Therapeutic exercise;Manual Therapy;Patient/family education;Passive range of motion;DME and/or AE instruction;Splinting    Consulted and Agree with Plan of Care Patient             Patient will benefit from skilled therapeutic intervention in order to  improve the following deficits and impairments:   Body Structure / Function / Physical Skills: ADL, Strength, Decreased knowledge of use of DME, Decreased knowledge of precautions, Flexibility, ROM, IADL, Edema, UE functional use, Pain       Visit Diagnosis: Carpal tunnel syndrome, left - Plan: Ot plan of care cert/re-cert  Numbness and tingling in left hand - Plan: Ot plan of care cert/re-cert  Pain in left hand - Plan: Ot plan of care cert/re-cert  Muscle weakness (generalized) - Plan: Ot plan of care cert/re-cert    Problem List Patient Active Problem List   Diagnosis Date Noted   Lymphedema 01/17/2016   Venous (peripheral) insufficiency 01/17/2016   Pain in limb 01/17/2016   Iron deficiency anemia due to chronic blood loss 01/09/2016   History of endometrial cancer 08/01/2015   Endometrial adenocarcinoma ([place]) 07/13/2015    Class: History of   Inguinal lymphadenopathy 07/13/2015   Long term current use of insulin ([place]) 12/12/2013   Microalbuminuria 12/12/2013   Diabetes ([place]) 07/28/2013   BP (high blood pressure) 07/28/2013   HLD (hyperlipidemia) 07/28/2013   Adiposity 07/28/2013   Obstructive apnea 07/28/2013   Arthritis, degenerative 07/28/2013   Apnea, sleep 07/28/2013   Thyroid nodule 07/28/2013    Rosalyn Gess, OTR/L,CLT 01/03/2021, 8:37 PM  [place] [facility] [address] [place], [address] Phone: [phone]   Fax:  [phone]  Name: Christine Lawson MRN: [id number] Date of Birth: [birth year]

## 2021-01-08 ENCOUNTER — Ambulatory Visit: Payer: Medicare Other

## 2021-01-08 ENCOUNTER — Other Ambulatory Visit: Payer: Medicare Other

## 2021-01-08 ENCOUNTER — Ambulatory Visit: Payer: Medicare Other | Admitting: Internal Medicine

## 2021-01-10 ENCOUNTER — Ambulatory Visit: Payer: Medicare Other | Admitting: Occupational Therapy

## 2021-01-15 ENCOUNTER — Ambulatory Visit: Payer: Medicare Other | Admitting: Occupational Therapy

## 2021-01-15 DIAGNOSIS — G5602 Carpal tunnel syndrome, left upper limb: Secondary | ICD-10-CM | POA: Diagnosis not present

## 2021-01-15 DIAGNOSIS — M6281 Muscle weakness (generalized): Secondary | ICD-10-CM

## 2021-01-15 DIAGNOSIS — R202 Paresthesia of skin: Secondary | ICD-10-CM

## 2021-01-15 DIAGNOSIS — M79642 Pain in left hand: Secondary | ICD-10-CM

## 2021-01-15 DIAGNOSIS — R200 Anesthesia of skin: Secondary | ICD-10-CM

## 2021-01-15 NOTE — Therapy (Signed)
[place] [facility] [address] [place], [address] Phone: [phone]   Fax:  [phone]  Occupational Therapy Treatment  Patient Details  Name: Christine Lawson MRN: [id number] Date of Birth: 02-Jun-1960 Referring Provider (OT): Cameron Proud- PA   Encounter Date: 01/15/2021   OT End of Session - 01/15/21 1453     Visit Number 2    Number of Visits 8    Date for OT Re-Evaluation 01/31/21    OT Start Time 1400    OT Stop Time 1440    OT Time Calculation (min) 40 min    Activity Tolerance Patient tolerated treatment well    Behavior During Therapy [facility] for tasks assessed/performed             Past Medical History:  Diagnosis Date   Diabetes mellitus without complication ([place])    Endometrial adenocarcinoma ([place])    GERD (gastroesophageal reflux disease)    Hematochezia    Hyperlipidemia    Hypertension    IDA (iron deficiency anemia)    Inguinal lymphadenopathy 07/13/2015   Irritable bowel syndrome    Obesity    Osteoarthritis    Sleep apnea     Past Surgical History:  Procedure Laterality Date   ABDOMINAL HYSTERECTOMY  feb 2013   COLONOSCOPY WITH PROPOFOL N/A 08/02/2015   Procedure: COLONOSCOPY WITH PROPOFOL;  Surgeon: Manya Silvas, MD;  Location: [place];  Service: Endoscopy;  Laterality: N/A;   ESOPHAGOGASTRODUODENOSCOPY (EGD) WITH PROPOFOL N/A 08/02/2015   Procedure: ESOPHAGOGASTRODUODENOSCOPY (EGD) WITH PROPOFOL;  Surgeon: Manya Silvas, MD;  Location: [facility] ENDOSCOPY;  Service: Endoscopy;  Laterality: N/A;    There were no vitals filed for this visit.   Subjective Assessment - 01/15/21 1418     Subjective  I did get a heating and cold packs - it likes mitten - works good- [name] just need to help me with the massage- stil some pain and still numbness    Pertinent History Seen by Ortho PA - L McGhee on 12/10/20 for evaluation of ongoing left shoulder in addition to the left wrist  pain. The patient was evaluated at the walk-in clinic on 11/13/2020. The patient has been experiencing left wrist and forearm pain in addition to pain along the anterior lateral aspect left shoulder for the previous 3 days. The patient denies any recent falls or trauma affecting the left arm. The patient was evaluated for similar pain in both the left shoulder and left wrist in the past after initially a fall. The patient was diagnosed with left frozen shoulder and a left wrist sprain. The patient underwent a left wrist MRI scan and CT scan both which were negative for ligament tears or acute fracture. The patient responded well to physical therapy however over the past several weeks she has noticed increased pain left shoulder and decreased range of motion. She denies any weakness to the left shoulder but does have increased pain when trying to reach above shoulder level. The patient has also been experiencing numbness and tingling in the index and middle fingers and left hand ongoing for the past several weeks as well. This is occurring more frequently during the day, she has been wearing an Ace wrap for support. She denies any surgical history to the left arm. She is right-hand dominant. She does have some increased discomfort when sleeping at night, and the pain in the shoulder is a aching, throbbing discomfort. The patient has generalized pain throughout the hand  with the numbness and tingling present. She denies any catching or locking symptoms in the left hand. The patient did undergo a left forearm and humerus x-rays at the walk-in clinic which were negative for acute fracture, there was some underlying arthritic changes in the shoulder and left wrist as well. The patient was prescribed Celebrex from the internal medicine department which she has been taking routinely.Refer to OT ,  Scheduled for nerve conduction on 01/28/21 -    Patient Stated Goals Want my L hand numbness , pain better so I can use it  around the house    Currently in Pain? --   no nr provided               [facility] OT Assessment - 01/15/21 0001       Strength   Left Hand Grip (lbs) 35      Left Hand AROM   L Thumb Opposition to Index --   Opposiition to 5th - but pain into flexion of thumb to palm   L Index  MCP 0-90 80 Degrees    L Index PIP 0-100 90 Degrees    L Long  MCP 0-90 85 Degrees    L Long PIP 0-100 100 Degrees    L Ring  MCP 0-90 85 Degrees    L Ring PIP 0-100 90 Degrees    L Little  MCP 0-90 85 Degrees    L Little PIP 0-100 90 Degrees             Pt show increase flexion of digits and grip increase - see flowsheet         OT Treatments/Exercises (OP) - 01/15/21 0001       LUE Fluidotherapy   Number Minutes Fluidotherapy 12 Minutes    LUE Fluidotherapy Location Hand;Wrist    Comments AROM for tendon glides and wrist - ice 2 x 1 min               Pt and sister ed on  soft tissue again and contrast at home  Contrast 2 x day - MC spreads 10 reps and webspace massage 10 reps Done also some graston toolr nr 2 sweeping over volar , dorsal and radial wrist and forearm prior to AAROM by OT  Thumb PA and RA AAROM 10 reps  Tendon glides with focus on extention -and not force flexion - light AAROM 10 reps by OT  Light extention and flexion wrist stretches and AAROM for wrist RD, UD by OT 12 reps  2 x day  Cont splint as order by ortho       OT Education - 01/15/21 1419     Education Details progress and HEP    Person(s) Educated Patient    Methods Explanation;Demonstration;Tactile cues;Verbal cues;Handout    Comprehension Verbal cues required;Returned demonstration;Verbalized understanding              OT Short Term Goals - 01/03/21 2030       OT SHORT TERM GOAL #1   Title Pain/numbness  on L wrist/ hand  decrease to less than 2/10 with use during day    Baseline 6-8/10 intermittend    Time 3    Period Weeks    Status New    Target Date 01/24/21      OT  SHORT TERM GOAL #2   Title Pt to be ind in HEP to decrease pain , increase AROM in wrist in all planes and increase grip  Baseline no knowledge of HEP - pain 6-8/10 - decrease L  wrist ext 50/ flexion 40    Time 3    Period Weeks    Status New    Target Date 01/24/21               OT Long Term Goals - 01/03/21 2032       OT LONG TERM GOAL #1   Title Pt L grip  strength increase by more than 5 lbs  for pt to use L hand in more than 50% of bathing , turn doorknob without increase symptoms    Baseline pain 6-8/10 with use , strenght grip L 26, R 41 lbs - not using as much    Time 4    Period Weeks    Status New    Target Date 01/31/21      OT LONG TERM GOAL #2   Title L hand numbness improve for pt to have less than 1-2 episodes of numbness a day    Baseline during day with use and just sitting - numbness reported mostly in thumb and 2nd digit -will do semmes weinstein next session    Time 4    Period Weeks    Status New    Target Date 01/31/21                   Plan - 01/15/21 1454     Clinical Impression Statement Pt refer to OT for L CTS - pt report had symptoms since about 3 months ago -pt report tightness and pain mostly with use -and some numbness at times -pt with diminshed sensation in thumb and 2nd digit more than others - tenderness over CT , positive Tinel and Phalen's - pt show decrease grip and lat grip compare to L - increase edema in L hand and  tightness over flexors with decrease ext and flexion of wrist - all limting her functional use of L hand and wrist in ADL's and IADL's - Pt showed this date increase digits flexion and grip strength- but cont numbness and pain - pt ed on modifications with tablet and phone use - prop up or use her ring attachment on the back of her phone-  Pt to cont with  wrist prefab splint she wears at night time and when out -  pt can benefit from skilled OT services    OT Occupational Profile and History Problem Focused  Assessment - Including review of records relating to presenting problem    Occupational performance deficits (Please refer to evaluation for details): ADL's;IADL's;Play;Leisure;Social Participation    Body Structure / Function / Physical Skills ADL;Strength;Decreased knowledge of use of DME;Decreased knowledge of precautions;Flexibility;ROM;IADL;Edema;UE functional use;Pain    Rehab Potential Fair    Clinical Decision Making Limited treatment options, no task modification necessary    Comorbidities Affecting Occupational Performance: None    Modification or Assistance to Complete Evaluation  No modification of tasks or assist necessary to complete eval    OT Frequency 2x / week    OT Duration 4 weeks    OT Treatment/Interventions Self-care/ADL training;Ultrasound;Contrast Bath;Therapeutic exercise;Manual Therapy;Patient/family education;Passive range of motion;DME and/or AE instruction;Splinting    Consulted and Agree with Plan of Care Patient             Patient will benefit from skilled therapeutic intervention in order to improve the following deficits and impairments:   Body Structure / Function / Physical Skills: ADL, Strength, Decreased knowledge of use  of DME, Decreased knowledge of precautions, Flexibility, ROM, IADL, Edema, UE functional use, Pain       Visit Diagnosis: Carpal tunnel syndrome, left  Numbness and tingling in left hand  Pain in left hand  Muscle weakness (generalized)    Problem List Patient Active Problem List   Diagnosis Date Noted   Lymphedema 01/17/2016   Venous (peripheral) insufficiency 01/17/2016   Pain in limb 01/17/2016   Iron deficiency anemia due to chronic blood loss 01/09/2016   History of endometrial cancer 08/01/2015   Endometrial adenocarcinoma ([place]) 07/13/2015    Class: History of   Inguinal lymphadenopathy 07/13/2015   Long term current use of insulin ([place]) 12/12/2013   Microalbuminuria 12/12/2013   Diabetes ([place]) 07/28/2013    BP (high blood pressure) 07/28/2013   HLD (hyperlipidemia) 07/28/2013   Adiposity 07/28/2013   Obstructive apnea 07/28/2013   Arthritis, degenerative 07/28/2013   Apnea, sleep 07/28/2013   Thyroid nodule 07/28/2013    Rosalyn Gess, OTR/L,CLT 01/15/2021, 2:57 PM  [place] [facility] [address] [place], [address] Phone: [phone]   Fax:  [phone]  Name: Christine Lawson MRN: [id number] Date of Birth: May 31, 1960

## 2021-01-18 ENCOUNTER — Encounter: Payer: Medicare Other | Admitting: Occupational Therapy

## 2021-01-21 ENCOUNTER — Other Ambulatory Visit: Payer: Self-pay

## 2021-01-21 ENCOUNTER — Inpatient Hospital Stay: Payer: Medicare Other

## 2021-01-21 ENCOUNTER — Inpatient Hospital Stay: Payer: Medicare Other | Attending: Internal Medicine

## 2021-01-21 ENCOUNTER — Encounter: Payer: Self-pay | Admitting: Internal Medicine

## 2021-01-21 ENCOUNTER — Inpatient Hospital Stay (HOSPITAL_BASED_OUTPATIENT_CLINIC_OR_DEPARTMENT_OTHER): Payer: Medicare Other | Admitting: Internal Medicine

## 2021-01-21 DIAGNOSIS — Z79899 Other long term (current) drug therapy: Secondary | ICD-10-CM | POA: Diagnosis not present

## 2021-01-21 DIAGNOSIS — D509 Iron deficiency anemia, unspecified: Secondary | ICD-10-CM | POA: Diagnosis not present

## 2021-01-21 DIAGNOSIS — Z833 Family history of diabetes mellitus: Secondary | ICD-10-CM | POA: Diagnosis not present

## 2021-01-21 DIAGNOSIS — D500 Iron deficiency anemia secondary to blood loss (chronic): Secondary | ICD-10-CM

## 2021-01-21 DIAGNOSIS — E119 Type 2 diabetes mellitus without complications: Secondary | ICD-10-CM | POA: Insufficient documentation

## 2021-01-21 DIAGNOSIS — D75839 Thrombocytosis, unspecified: Secondary | ICD-10-CM | POA: Insufficient documentation

## 2021-01-21 DIAGNOSIS — Z882 Allergy status to sulfonamides status: Secondary | ICD-10-CM | POA: Diagnosis not present

## 2021-01-21 DIAGNOSIS — C541 Malignant neoplasm of endometrium: Secondary | ICD-10-CM | POA: Diagnosis not present

## 2021-01-21 DIAGNOSIS — K648 Other hemorrhoids: Secondary | ICD-10-CM | POA: Diagnosis not present

## 2021-01-21 LAB — BASIC METABOLIC PANEL WITH GFR
Anion gap: 6 (ref 5–15)
BUN: 12 mg/dL (ref 6–20)
CO2: 31 mmol/L (ref 22–32)
Calcium: 8.5 mg/dL — ABNORMAL LOW (ref 8.9–10.3)
Chloride: 99 mmol/L (ref 98–111)
Creatinine, Ser: 0.71 mg/dL (ref 0.44–1.00)
GFR, Estimated: >60 mL/min (ref >60)
Glucose, Bld: 139 mg/dL — ABNORMAL HIGH (ref 70–99)
Potassium: 4 mmol/L (ref 3.5–5.1)
Sodium: 136 mmol/L (ref 135–145)

## 2021-01-21 LAB — CBC WITH DIFFERENTIAL/PLATELET
Abs Immature Granulocytes: 0.06 10*3/uL (ref 0.00–0.07)
Basophils Absolute: 0.1 10*3/uL (ref 0.0–0.1)
Basophils Relative: 1 %
Eosinophils Absolute: 0.3 10*3/uL (ref 0.0–0.5)
Eosinophils Relative: 3 %
HCT: 39 % (ref 36.0–46.0)
Hemoglobin: 12.1 g/dL (ref 12.0–15.0)
Immature Granulocytes: 1 %
Lymphocytes Relative: 19 %
Lymphs Abs: 2.1 10*3/uL (ref 0.7–4.0)
MCH: 26.4 pg (ref 26.0–34.0)
MCHC: 31 g/dL (ref 30.0–36.0)
MCV: 85.2 fL (ref 80.0–100.0)
Monocytes Absolute: 0.7 10*3/uL (ref 0.1–1.0)
Monocytes Relative: 6 %
Neutro Abs: 7.5 10*3/uL (ref 1.7–7.7)
Neutrophils Relative %: 70 %
Platelets: 424 10*3/uL — ABNORMAL HIGH (ref 150–400)
RBC: 4.58 MIL/uL (ref 3.87–5.11)
RDW: 13.3 % (ref 11.5–15.5)
WBC: 10.7 10*3/uL — ABNORMAL HIGH (ref 4.0–10.5)
nRBC: 0 % (ref 0.0–0.2)

## 2021-01-21 LAB — IRON AND TIBC
Iron: 57 ug/dL (ref 28–170)
Saturation Ratios: 17 % (ref 10.4–31.8)
TIBC: 336 ug/dL (ref 250–450)
UIBC: 279 ug/dL

## 2021-01-21 LAB — FERRITIN: Ferritin: 273 ng/mL (ref 11–307)

## 2021-01-21 NOTE — Progress Notes (Signed)
Patient denies new problems/concerns today.   °

## 2021-01-21 NOTE — Progress Notes (Signed)
[facility] OFFICE PROGRESS NOTE  Patient Care Team: Tracie Harrier, MD as PCP - General (Internal Medicine) Clent Jacks, RN as Registered Nurse   Cancer Staging  No matching staging information was found for the patient.   Oncology History  Endometrial adenocarcinoma ([place])  07/13/2015 Initial Diagnosis   Endometrial adenocarcinoma ([place])    Anemia of iron deficiency unresponsive to oral iron therapy - got IV Venofer 1 g in Feb 2015, then on oral Ferrous sulfate 1 tablet daily. (labs on 03/28/13 had showed hemoglobin 10.0, MCV 71.3, WBC 10,800, ANC 7990, ALC 1640, platelet count 564, ferritin 27, serum iron 33, iron saturation low at 7%, TIBC 452. In June 2014 - hemoglobin 9.8, WBC 10,700, platelets 551, LFT unremarkable except alkaline phos of 120, creatinine 0.6). Stool Hemoccult was negative x2 in November 2014.  Colonoscopy July 2013 had shown internal hemorrhoids.  INTERVAL HISTORY: Accompanied by sister.  Ambulating with a cane.  Morbidly obese.  Christine Lawson 60 y.o.  female pleasant patient above history of iron deficiency anemia; also history of early stage endometrial cancer is here for follow-up.  Patient denies any nausea vomiting abdominal pain.  Denies any fatigue.  Patient denies any blood in stools or black-colored stools.  Patient is on iron pills once a day.  Review of Systems  Constitutional:  Negative for chills, diaphoresis, fever, malaise/fatigue and weight loss.  HENT:  Negative for nosebleeds and sore throat.   Eyes:  Negative for double vision.  Respiratory:  Negative for cough, hemoptysis, sputum production and wheezing.   Cardiovascular:  Negative for chest pain, palpitations, orthopnea and leg swelling.  Gastrointestinal:  Negative for abdominal pain, blood in stool, constipation, diarrhea, heartburn, melena, nausea and vomiting.  Genitourinary:  Negative for dysuria, frequency and urgency.  Musculoskeletal:  Negative for back  pain and joint pain.  Skin: Negative.  Negative for itching and rash.  Neurological:  Negative for dizziness, tingling, focal weakness, weakness and headaches.  Endo/Heme/Allergies:  Does not bruise/bleed easily.  Psychiatric/Behavioral:  Negative for depression. The patient is not nervous/anxious and does not have insomnia.      PAST MEDICAL HISTORY :  Past Medical History:  Diagnosis Date   Diabetes mellitus without complication ([place])    Endometrial adenocarcinoma ([place])    GERD (gastroesophageal reflux disease)    Hematochezia    Hyperlipidemia    Hypertension    IDA (iron deficiency anemia)    Inguinal lymphadenopathy 07/13/2015   Irritable bowel syndrome    Obesity    Osteoarthritis    Sleep apnea     PAST SURGICAL HISTORY :   Past Surgical History:  Procedure Laterality Date   ABDOMINAL HYSTERECTOMY  feb 2013   COLONOSCOPY WITH PROPOFOL N/A 08/02/2015   Procedure: COLONOSCOPY WITH PROPOFOL;  Surgeon: Manya Silvas, MD;  Location: [facility];  Service: Endoscopy;  Laterality: N/A;   ESOPHAGOGASTRODUODENOSCOPY (EGD) WITH PROPOFOL N/A 08/02/2015   Procedure: ESOPHAGOGASTRODUODENOSCOPY (EGD) WITH PROPOFOL;  Surgeon: Manya Silvas, MD;  [facility] ENDOSCOPY;  Service: Endoscopy;  Laterality: N/A;    FAMILY HISTORY :   Family History  Problem Relation Age of Onset   Diabetes Mother    Diabetes Father    Breast cancer Neg Hx     SOCIAL HISTORY:   Social History   Tobacco Use   Smoking status: Never   Smokeless tobacco: Never  Substance Use Topics   Alcohol use: No   Drug use: No    ALLERGIES:  is  allergic to solifenacin, contrast media [iodinated diagnostic agents], dicyclomine, isovue [iopamidol], and sulfa antibiotics.  MEDICATIONS:  Current Outpatient Medications  Medication Sig Dispense Refill   albuterol (PROVENTIL HFA;VENTOLIN HFA) 108 (90 Base) MCG/ACT inhaler Inhale 1 puff into the lungs every 6 (six) hours as needed for wheezing or shortness  of breath.     allopurinol (ZYLOPRIM) 100 MG tablet Take 100 mg by mouth daily.      amLODipine (NORVASC) 10 MG tablet Take 1 tablet by mouth daily.     ascorbic acid (VITAMIN C) 500 MG tablet Take 1 tablet by mouth daily.     aspirin 81 MG EC tablet Take 81 mg by mouth daily. Swallow whole.     budesonide-formoterol (SYMBICORT) 80-4.5 MCG/ACT inhaler INHALE 2 INHALATIONS INTO THE LUNGS 2 (TWO) TIMES DAILY     celecoxib (CELEBREX) 100 MG capsule Take 1 capsule by mouth 2 (two) times daily.     cholecalciferol (VITAMIN D) 1000 UNITS tablet Take 1,000 Units by mouth daily.     diclofenac sodium (VOLTAREN) 1 % GEL Apply 1 application topically 2 (two) times daily as needed for pain.     dicyclomine (BENTYL) 10 MG capsule Take 10 mg by mouth daily.     enalapril (VASOTEC) 5 MG tablet Take 5 mg by mouth daily.     ferrous sulfate 325 (65 FE) MG tablet Take 325 mg by mouth 2 (two) times daily with a meal.      HUMALOG KWIKPEN 100 UNIT/ML KwikPen Inject into the skin 3 (three) times daily. 26 units with breakfast, 24 units with lunch, 80 units with supper     insulin glargine, 2 Unit Dial, (TOUJEO MAX SOLOSTAR) 300 UNIT/ML Solostar Pen Inject 80-120 Units into the skin daily.     Lifitegrast 5 % SOLN Apply 1 drop to eye as needed for dry eyes.     montelukast (SINGULAIR) 10 MG tablet Take 10 mg by mouth daily.     pantoprazole (PROTONIX) 40 MG tablet Take 40 mg by mouth daily.     rosuvastatin (CRESTOR) 5 MG tablet Take 5 mg by mouth daily.     sucralfate (CARAFATE) 1 g tablet Take by mouth.     vitamin B-12 (CYANOCOBALAMIN) 500 MCG tablet Take 1 tablet by mouth daily.     No current facility-administered medications for this visit.    PHYSICAL EXAMINATION: ECOG PERFORMANCE STATUS: 0 - Asymptomatic  BP (!) 164/79   Pulse 84   Temp (!) 97.4 F (36.3 C)   Resp 20   Wt 274 lb (124.3 kg)   BMI 48.54 kg/m   Filed Weights   01/21/21 1314  Weight: 274 lb (124.3 kg)    Physical  Exam Constitutional:      Comments: Alone.  Walking with a cane.  Obese.  HENT:     Head: Normocephalic and atraumatic.     Mouth/Throat:     Pharynx: No oropharyngeal exudate.  Eyes:     Pupils: Pupils are equal, round, and reactive to light.  Cardiovascular:     Rate and Rhythm: Normal rate and regular rhythm.  Pulmonary:     Effort: No respiratory distress.     Breath sounds: No wheezing.  Abdominal:     General: Bowel sounds are normal. There is no distension.     Palpations: Abdomen is soft. There is no mass.     Tenderness: There is no abdominal tenderness. There is no guarding or rebound.  Musculoskeletal:  General: No tenderness. Normal range of motion.     Cervical back: Normal range of motion and neck supple.  Skin:    General: Skin is warm.  Neurological:     Mental Status: She is alert and oriented to person, place, and time.  Psychiatric:        Mood and Affect: Affect normal.    LABORATORY DATA:  I have reviewed the data as listed    Component Value Date/Time   NA 136 01/21/2021 1259   NA 141 06/03/2013 2039   K 4.0 01/21/2021 1259   K 3.7 06/03/2013 2039   CL 99 01/21/2021 1259   CL 106 06/03/2013 2039   CO2 31 01/21/2021 1259   CO2 30 06/03/2013 2039   GLUCOSE 139 (H) 01/21/2021 1259   GLUCOSE 59 (L) 06/03/2013 2039   BUN 12 01/21/2021 1259   BUN 6 (L) 06/03/2013 2039   CREATININE 0.71 01/21/2021 1259   CREATININE 0.59 (L) 06/03/2013 2039   CALCIUM 8.5 (L) 01/21/2021 1259   CALCIUM 8.8 06/03/2013 2039   PROT 7.9 11/17/2018 1313   PROT 8.8 (H) 06/03/2013 2039   ALBUMIN 3.6 11/17/2018 1313   ALBUMIN 3.6 06/03/2013 2039   AST 26 11/17/2018 1313   AST 38 (H) 06/03/2013 2039   ALT 24 11/17/2018 1313   ALT 44 06/03/2013 2039   ALKPHOS 118 11/17/2018 1313   ALKPHOS 134 (H) 06/03/2013 2039   BILITOT 0.7 11/17/2018 1313   BILITOT 0.4 06/03/2013 2039   GFRNONAA >60 01/21/2021 1259   GFRNONAA >60 06/03/2013 2039   GFRAA >60 07/05/2019 1239    GFRAA >60 06/03/2013 2039    No results found for: SPEP, UPEP  Lab Results  Component Value Date   WBC 10.7 (H) 01/21/2021   NEUTROABS 7.5 01/21/2021   HGB 12.1 01/21/2021   HCT 39.0 01/21/2021   MCV 85.2 01/21/2021   PLT 424 (H) 01/21/2021      Chemistry      Component Value Date/Time   NA 136 01/21/2021 1259   NA 141 06/03/2013 2039   K 4.0 01/21/2021 1259   K 3.7 06/03/2013 2039   CL 99 01/21/2021 1259   CL 106 06/03/2013 2039   CO2 31 01/21/2021 1259   CO2 30 06/03/2013 2039   BUN 12 01/21/2021 1259   BUN 6 (L) 06/03/2013 2039   CREATININE 0.71 01/21/2021 1259   CREATININE 0.59 (L) 06/03/2013 2039      Component Value Date/Time   CALCIUM 8.5 (L) 01/21/2021 1259   CALCIUM 8.8 06/03/2013 2039   ALKPHOS 118 11/17/2018 1313   ALKPHOS 134 (H) 06/03/2013 2039   AST 26 11/17/2018 1313   AST 38 (H) 06/03/2013 2039   ALT 24 11/17/2018 1313   ALT 44 06/03/2013 2039   BILITOT 0.7 11/17/2018 1313   BILITOT 0.4 06/03/2013 2039       RADIOGRAPHIC STUDIES: I have personally reviewed the radiological images as listed and agreed with the findings in the report. No results found.   ASSESSMENT & PLAN:  Iron deficiency anemia due to chronic blood loss Iron deficiency anemia-question etiology. Today 12.1.  Iron studies pending.  Continue by mouth iron PO one a day.  No IV iron today.   # History of endometrial cancer status post surgery-no adjuvant therapy; recent CT -NEG; contonue follow up with Gyn, Dr.Schermerhorn. STABLE  # Mild thrombocytosis: likely secondary.  monitor for now.   I spoke at length with the patient's sister- regarding the patient's clinical  status/plan of care.  Family agreement.     DISPOSITION: labs print out # NO infusion  #  follow-up in 6 months with MD- iron studies-Ferritin/CBC BMP; possible ferrahem IV- Dr.B   Orders Placed This Encounter  Procedures   CBC with Differential/Platelet    Standing Status:   Future    Standing Expiration  Date:   74/45/1460   Basic metabolic panel    Standing Status:   Future    Standing Expiration Date:   01/21/2022   Ferritin    Standing Status:   Future    Standing Expiration Date:   01/21/2022   Iron and TIBC    Standing Status:   Future    Standing Expiration Date:   01/21/2022    All questions were answered. The patient knows to call the clinic with any problems, questions or concerns.      Cammie Sickle, MD 01/21/2021 1:49 PM

## 2021-01-21 NOTE — Assessment & Plan Note (Addendum)
Iron deficiency anemia-question etiology. Today 12.1.  Iron studies pending.  Continue by mouth iron PO one a day.  No IV iron today.   # History of endometrial cancer status post surgery-no adjuvant therapy; recent CT -NEG; contonue follow up with Gyn, Dr.Schermerhorn. STABLE  # Mild thrombocytosis: likely secondary.  monitor for now.   I spoke at length with the patient's sister- regarding the patient's clinical status/plan of care.  Family agreement.     DISPOSITION: labs print out # NO infusion  #  follow-up in 6 months with MD- iron studies-Ferritin/CBC BMP; possible ferrahem IV- Dr.B

## 2021-01-22 ENCOUNTER — Ambulatory Visit: Payer: Medicare Other | Admitting: Occupational Therapy

## 2021-01-22 DIAGNOSIS — G5602 Carpal tunnel syndrome, left upper limb: Secondary | ICD-10-CM

## 2021-01-22 DIAGNOSIS — M79642 Pain in left hand: Secondary | ICD-10-CM

## 2021-01-22 DIAGNOSIS — M6281 Muscle weakness (generalized): Secondary | ICD-10-CM

## 2021-01-22 DIAGNOSIS — R200 Anesthesia of skin: Secondary | ICD-10-CM

## 2021-01-22 DIAGNOSIS — R202 Paresthesia of skin: Secondary | ICD-10-CM

## 2021-01-22 NOTE — Therapy (Signed)
[facility] [address] [place], [address] Phone: [phone]   Fax:  [phone]  Occupational Therapy Treatment  Patient Details  Name: Christine Lawson MRN: [id number] Date of Birth: 08-28-1960 Referring Provider (OT): Cameron Proud- PA   Encounter Date: 01/22/2021   OT End of Session - 01/22/21 1931     Visit Number 3    Number of Visits 8    Date for OT Re-Evaluation 01/31/21    OT Start Time 1401    OT Stop Time 1442    OT Time Calculation (min) 41 min    Activity Tolerance Patient tolerated treatment well    Behavior During Therapy [facility] for tasks assessed/performed             Past Medical History:  Diagnosis Date   Diabetes mellitus without complication ([place])    Endometrial adenocarcinoma ([place])    GERD (gastroesophageal reflux disease)    Hematochezia    Hyperlipidemia    Hypertension    IDA (iron deficiency anemia)    Inguinal lymphadenopathy 07/13/2015   Irritable bowel syndrome    Obesity    Osteoarthritis    Sleep apnea     Past Surgical History:  Procedure Laterality Date   ABDOMINAL HYSTERECTOMY  feb 2013   COLONOSCOPY WITH PROPOFOL N/A 08/02/2015   Procedure: COLONOSCOPY WITH PROPOFOL;  Surgeon: Manya Silvas, MD;  Location: [facility];  Service: Endoscopy;  Laterality: N/A;   ESOPHAGOGASTRODUODENOSCOPY (EGD) WITH PROPOFOL N/A 08/02/2015   Procedure: ESOPHAGOGASTRODUODENOSCOPY (EGD) WITH PROPOFOL;  Surgeon: Manya Silvas, MD;  Location[facility] ENDOSCOPY;  Service: Endoscopy;  Laterality: N/A;    There were no vitals filed for this visit.   Subjective Assessment - 01/22/21 1930     Subjective  LIttle better maybe but still numbness during and pain when using my hand - like pulling my shirt over my head - hurt my shoulder too    Pertinent History Seen by Ortho PA - L McGhee on 12/10/20 for evaluation of ongoing left shoulder in addition to the left wrist pain. The  patient was evaluated at the walk-in clinic on 11/13/2020. The patient has been experiencing left wrist and forearm pain in addition to pain along the anterior lateral aspect left shoulder for the previous 3 days. The patient denies any recent falls or trauma affecting the left arm. The patient was evaluated for similar pain in both the left shoulder and left wrist in the past after initially a fall. The patient was diagnosed with left frozen shoulder and a left wrist sprain. The patient underwent a left wrist MRI scan and CT scan both which were negative for ligament tears or acute fracture. The patient responded well to physical therapy however over the past several weeks she has noticed increased pain left shoulder and decreased range of motion. She denies any weakness to the left shoulder but does have increased pain when trying to reach above shoulder level. The patient has also been experiencing numbness and tingling in the index and middle fingers and left hand ongoing for the past several weeks as well. This is occurring more frequently during the day, she has been wearing an Ace wrap for support. She denies any surgical history to the left arm. She is right-hand dominant. She does have some increased discomfort when sleeping at night, and the pain in the shoulder is a aching, throbbing discomfort. The patient has generalized pain throughout the hand with the numbness and  tingling present. She denies any catching or locking symptoms in the left hand. The patient did undergo a left forearm and humerus x-rays at the walk-in clinic which were negative for acute fracture, there was some underlying arthritic changes in the shoulder and left wrist as well. The patient was prescribed Celebrex from the internal medicine department which she has been taking routinely.Refer to OT ,  Scheduled for nerve conduction on 01/28/21 -    Patient Stated Goals Want my L hand numbness , pain better so I can use it around the  house    Currently in Pain? Yes    Pain Score 6     Pain Location Hand    Pain Orientation Left    Pain Descriptors / Indicators Aching;Numbness                          OT Treatments/Exercises (OP) - 01/22/21 0001       LUE Fluidotherapy   Number Minutes Fluidotherapy 12 Minutes    LUE Fluidotherapy Location Hand;Wrist             Pt and sister ed on  soft tissue again and contrast at home - sister is helping her  Contrast 2 x day - MC spreads 10 reps and webspace massage 10 reps Done also some graston toolr nr 2 sweeping over volar , dorsal and radial wrist and forearm prior to AAROM by OT  Tightness and tenderness over radial forearm Thumb PA and RA AAROM 10 reps Opposition to all digits - sliding down 5th   Tendon glides with focus on extention -and not force flexion - light AAROM 10 reps by OT  Light extention and flexion wrist stretches and AAROM for wrist RD, UD by OT 12 reps  2 x day  Cont splint as order by ortho         OT Education - 01/22/21 1931     Education Details progress and HEP    Person(s) Educated Patient    Methods Explanation;Demonstration;Tactile cues;Verbal cues;Handout    Comprehension Verbal cues required;Returned demonstration;Verbalized understanding              OT Short Term Goals - 01/03/21 2030       OT SHORT TERM GOAL #1   Title Pain/numbness  on L wrist/ hand  decrease to less than 2/10 with use during day    Baseline 6-8/10 intermittend    Time 3    Period Weeks    Status New    Target Date 01/24/21      OT SHORT TERM GOAL #2   Title Pt to be ind in HEP to decrease pain , increase AROM in wrist in all planes and increase grip     Baseline no knowledge of HEP - pain 6-8/10 - decrease L  wrist ext 50/ flexion 40    Time 3    Period Weeks    Status New    Target Date 01/24/21               OT Long Term Goals - 01/03/21 2032       OT LONG TERM GOAL #1   Title Pt L grip  strength increase  by more than 5 lbs  for pt to use L hand in more than 50% of bathing , turn doorknob without increase symptoms    Baseline pain 6-8/10 with use , strenght grip L 26, R 41 lbs - not  using as much    Time 4    Period Weeks    Status New    Target Date 01/31/21      OT LONG TERM GOAL #2   Title L hand numbness improve for pt to have less than 1-2 episodes of numbness a day    Baseline during day with use and just sitting - numbness reported mostly in thumb and 2nd digit -will do semmes weinstein next session    Time 4    Period Weeks    Status New    Target Date 01/31/21                   Plan - 01/22/21 1931     Clinical Impression Statement Pt refer to OT for L CTS - pt report had symptoms since about 3 months ago -pt report tightness and pain mostly with use -and some numbness at times -pt with diminshed sensation in thumb and 2nd digit more than others - tenderness over CT , positive Tinel and Phalen's - pt show decrease grip and lat grip compare to L - increase edema in L hand and  tightness over flexors with decrease ext and flexion of wrist - all limting her functional use of L hand and wrist in ADL's and IADL's - Pt showed this date increase digits flexion and grip strength- but cont numbness and pain - pt ed on modifications with tablet and phone use - prop up or use her ring attachment on the back of her phone-  Pt to cont with  wrist prefab splint she wears at night time and when out -  pt can benefit from skilled OT services    OT Occupational Profile and History Problem Focused Assessment - Including review of records relating to presenting problem    Occupational performance deficits (Please refer to evaluation for details): ADL's;IADL's;Play;Leisure;Social Participation    Body Structure / Function / Physical Skills ADL;Strength;Decreased knowledge of use of DME;Decreased knowledge of precautions;Flexibility;ROM;IADL;Edema;UE functional use;Pain    Rehab Potential Fair     Clinical Decision Making Limited treatment options, no task modification necessary    Comorbidities Affecting Occupational Performance: None    Modification or Assistance to Complete Evaluation  No modification of tasks or assist necessary to complete eval    OT Frequency 2x / week    OT Duration 4 weeks    OT Treatment/Interventions Self-care/ADL training;Ultrasound;Contrast Bath;Therapeutic exercise;Manual Therapy;Patient/family education;Passive range of motion;DME and/or AE instruction;Splinting    Consulted and Agree with Plan of Care Patient             Patient will benefit from skilled therapeutic intervention in order to improve the following deficits and impairments:   Body Structure / Function / Physical Skills: ADL, Strength, Decreased knowledge of use of DME, Decreased knowledge of precautions, Flexibility, ROM, IADL, Edema, UE functional use, Pain       Visit Diagnosis: Carpal tunnel syndrome, left  Numbness and tingling in left hand  Pain in left hand  Muscle weakness (generalized)    Problem List Patient Active Problem List   Diagnosis Date Noted   Lymphedema 01/17/2016   Venous (peripheral) insufficiency 01/17/2016   Pain in limb 01/17/2016   Iron deficiency anemia due to chronic blood loss 01/09/2016   History of endometrial cancer 08/01/2015   Endometrial adenocarcinoma ([place]) 07/13/2015    Class: History of   Inguinal lymphadenopathy 07/13/2015   Long term current use of insulin ([place]) 12/12/2013   Microalbuminuria 12/12/2013  Diabetes ([place]) 07/28/2013   BP (high blood pressure) 07/28/2013   HLD (hyperlipidemia) 07/28/2013   Adiposity 07/28/2013   Obstructive apnea 07/28/2013   Arthritis, degenerative 07/28/2013   Apnea, sleep 07/28/2013   Thyroid nodule 07/28/2013    Rosalyn Gess, OTR/L,CLT 01/22/2021, 7:34 PM  [facility] [address] [place], [address] Phone:  [phone]   Fax:  [phone]  Name: Baylin Cabal MRN: [id number] Date of Birth: Feb 20, 1961

## 2021-01-29 ENCOUNTER — Ambulatory Visit: Payer: Medicare Other | Admitting: Occupational Therapy

## 2021-01-31 ENCOUNTER — Ambulatory Visit: Payer: Medicare Other | Admitting: Occupational Therapy

## 2021-02-07 ENCOUNTER — Encounter: Payer: Medicare Other | Admitting: Occupational Therapy

## 2021-02-11 ENCOUNTER — Ambulatory Visit: Payer: Medicare Other | Admitting: Occupational Therapy

## 2021-02-12 ENCOUNTER — Encounter: Payer: Medicare Other | Admitting: Occupational Therapy

## 2021-02-13 ENCOUNTER — Ambulatory Visit: Payer: Medicare Other | Admitting: Occupational Therapy

## 2021-02-14 ENCOUNTER — Encounter: Payer: Medicare Other | Admitting: Occupational Therapy

## 2021-02-18 ENCOUNTER — Ambulatory Visit: Payer: Medicare Other | Admitting: Occupational Therapy

## 2021-02-19 ENCOUNTER — Encounter: Payer: Medicare Other | Admitting: Occupational Therapy

## 2021-02-20 ENCOUNTER — Ambulatory Visit: Payer: Medicare Other | Admitting: Occupational Therapy

## 2021-02-21 ENCOUNTER — Encounter: Payer: Medicare Other | Admitting: Occupational Therapy

## 2021-02-26 ENCOUNTER — Encounter: Payer: Medicare Other | Admitting: Occupational Therapy

## 2021-02-27 ENCOUNTER — Encounter: Payer: Medicare Other | Admitting: Occupational Therapy

## 2021-02-28 ENCOUNTER — Other Ambulatory Visit: Payer: Self-pay | Admitting: Surgery

## 2021-02-28 ENCOUNTER — Encounter: Payer: Medicare Other | Admitting: Occupational Therapy

## 2021-03-06 ENCOUNTER — Ambulatory Visit: Payer: Medicare Other | Admitting: Dietician

## 2021-03-06 ENCOUNTER — Encounter: Payer: Self-pay | Admitting: Internal Medicine

## 2021-03-13 ENCOUNTER — Other Ambulatory Visit: Payer: Self-pay

## 2021-03-13 ENCOUNTER — Encounter
Admission: RE | Admit: 2021-03-13 | Discharge: 2021-03-13 | Disposition: A | Discharge status: Home / Self Care | Payer: Commercial Managed Care - HMO | Source: Ambulatory Visit | Attending: Surgery | Admitting: Surgery

## 2021-03-13 VITALS — Ht 63.0 in | Wt 262.0 lb

## 2021-03-13 DIAGNOSIS — I10 Essential (primary) hypertension: Secondary | ICD-10-CM

## 2021-03-13 DIAGNOSIS — Z8542 Personal history of malignant neoplasm of other parts of uterus: Secondary | ICD-10-CM

## 2021-03-13 DIAGNOSIS — G4733 Obstructive sleep apnea (adult) (pediatric): Secondary | ICD-10-CM

## 2021-03-13 DIAGNOSIS — E109 Type 1 diabetes mellitus without complications: Secondary | ICD-10-CM

## 2021-03-13 NOTE — Patient Instructions (Addendum)
Your procedure is scheduled on: 03/20/2021 Report to the Registration Desk on the 1st floor of the [facility]. To find out your arrival time, please call [phone] between 1PM - 3PM on: 03/19/2021   REMEMBER: Instructions that are not followed completely may result in serious medical risk, up to and including death; or upon the discretion of your surgeon and anesthesiologist your surgery may need to be rescheduled.  Do not eat food after midnight the night before surgery.  No gum chewing, lozengers or hard candies.   Type 1 and Type 2 diabetics should only drink water.   TAKE THESE MEDICATIONS THE MORNING OF SURGERY WITH A SIP OF WATER:   Protonix (take one at night and in the am) Crestor  Use Symbicort inhaler on the day of surgery and bring albuterol with you to the hospital.    Take only HALF dose of your Toujeo insulin shot the night before surgery. Do not administer any insulin the morning of surgery.    Follow recommendations from Cardiologist, Pulmonologist or PCP regarding stopping Aspirin.  One week prior to surgery: Stop Anti-inflammatories (NSAIDS) such as Advil, Aleve, Ibuprofen, Motrin, Naproxen, Naprosyn and Aspirin based products such as Excedrin, Goodys Powder, BC Powder. Stop ANY OVER THE COUNTER supplements until after surgery like vitamin B-12 , ferrous sulfate            VITAMIN D, VITAMIN C You may however, continue to take Tylenol if needed for pain up until the day of surgery.  No Alcohol for 24 hours before or after surgery.  No Smoking including e-cigarettes for 24 hours prior to surgery.  No chewable tobacco products for at least 6 hours prior to surgery.  No nicotine patches on the day of surgery.  Do not use any "recreational" drugs for at least a week prior to your surgery.  Please be advised that the combination of cocaine and anesthesia may have negative outcomes, up to and including death. If you test positive for cocaine, your surgery  will be cancelled.  On the morning of surgery brush your teeth with toothpaste and water, you may rinse your mouth with mouthwash if you wish. Do not swallow any toothpaste or mouthwash.  Use CHG Soap or wipes as directed on instruction sheet- provided for you  Do not wear jewelry, make-up, hairpins, clips or nail polish.  Do not wear lotions, powders, or perfumes.   Do not shave body from the neck down 48 hours prior to surgery just in case you cut yourself which could leave a site for infection.  Also, freshly shaved skin may become irritated if using the CHG soap.  Contact lenses, hearing aids and dentures may not be worn into surgery.  Do not bring valuables to the hospital. [facility] is not responsible for any missing/lost belongings or valuables.   Bring your C-PAP to the hospital with you in case you may have to spend the night.   Notify your doctor if there is any change in your medical condition (cold, fever, infection).  Wear comfortable clothing (specific to your surgery type) to the hospital.  After surgery, you can help prevent lung complications by doing breathing exercises.  Take deep breaths and cough every 1-2 hours. Your doctor may order a device called an Incentive Spirometer to help you take deep breaths. If you are being admitted to the hospital overnight, leave your suitcase in the car. After surgery it may be brought to your room.  If you are being  discharged the day of surgery, you will not be allowed to drive home. You will need a responsible adult (18 years or older) to drive you home and stay with you that night.   If you are taking public transportation, you will need to have a responsible adult (18 years or older) with you. Please confirm with your physician that it is acceptable to use public transportation.   Please call the [facility] [place] Dept. at [phone] if you have any questions about these instructions.  Surgery Visitation  Policy:  Patients undergoing a surgery or procedure may have one family member or support person with them as long as that person is not COVID-19 positive or experiencing its symptoms.  That person may remain in the waiting area during the procedure and may rotate out with other people.

## 2021-03-14 ENCOUNTER — Encounter
Admission: RE | Admit: 2021-03-14 | Discharge: 2021-03-14 | Disposition: A | Discharge status: Home / Self Care | Payer: Medicare Other | Source: Ambulatory Visit | Attending: Surgery | Admitting: Surgery

## 2021-03-14 DIAGNOSIS — G4733 Obstructive sleep apnea (adult) (pediatric): Secondary | ICD-10-CM | POA: Insufficient documentation

## 2021-03-14 DIAGNOSIS — D500 Iron deficiency anemia secondary to blood loss (chronic): Secondary | ICD-10-CM | POA: Diagnosis not present

## 2021-03-14 DIAGNOSIS — Z01818 Encounter for other preprocedural examination: Secondary | ICD-10-CM | POA: Insufficient documentation

## 2021-03-14 DIAGNOSIS — E109 Type 1 diabetes mellitus without complications: Secondary | ICD-10-CM | POA: Insufficient documentation

## 2021-03-14 LAB — CBC
HCT: 39.6 % (ref 36.0–46.0)
Hemoglobin: 12.2 g/dL (ref 12.0–15.0)
MCH: 26 pg (ref 26.0–34.0)
MCHC: 30.8 g/dL (ref 30.0–36.0)
MCV: 84.3 fL (ref 80.0–100.0)
Platelets: 450 10*3/uL — ABNORMAL HIGH (ref 150–400)
RBC: 4.7 MIL/uL (ref 3.87–5.11)
RDW: 13.4 % (ref 11.5–15.5)
WBC: 12 10*3/uL — ABNORMAL HIGH (ref 4.0–10.5)
nRBC: 0 % (ref 0.0–0.2)

## 2021-03-14 LAB — BASIC METABOLIC PANEL
Anion gap: 10 (ref 5–15)
BUN: 12 mg/dL (ref 6–20)
CO2: 30 mmol/L (ref 22–32)
Calcium: 8.9 mg/dL (ref 8.9–10.3)
Chloride: 102 mmol/L (ref 98–111)
Creatinine, Ser: 0.67 mg/dL (ref 0.44–1.00)
GFR, Estimated: >60 mL/min (ref >60)
Glucose, Bld: 141 mg/dL — ABNORMAL HIGH (ref 70–99)
Potassium: 3.5 mmol/L (ref 3.5–5.1)
Sodium: 142 mmol/L (ref 135–145)

## 2021-03-19 MED ORDER — CEFAZOLIN IN SODIUM CHLORIDE 3-0.9 GM/100ML-% IV SOLN
3.0000 g | INTRAVENOUS | Status: AC
  Administered 2021-03-20: 3 g via INTRAVENOUS
  Filled 2021-03-19 (×2): qty 100

## 2021-03-20 ENCOUNTER — Ambulatory Visit
Admission: RE | Admit: 2021-03-20 | Discharge: 2021-03-20 | Disposition: A | Discharge status: Home / Self Care | Payer: Medicare Other | Attending: Surgery | Admitting: Surgery

## 2021-03-20 ENCOUNTER — Ambulatory Visit: Payer: Medicare Other | Admitting: Anesthesiology

## 2021-03-20 ENCOUNTER — Encounter
Admission: RE | Disposition: A | Discharge status: Home / Self Care | Payer: Self-pay | Source: Home / Self Care | Attending: Surgery

## 2021-03-20 ENCOUNTER — Encounter: Payer: Self-pay | Admitting: Surgery

## 2021-03-20 ENCOUNTER — Other Ambulatory Visit: Payer: Self-pay

## 2021-03-20 DIAGNOSIS — M1990 Unspecified osteoarthritis, unspecified site: Secondary | ICD-10-CM | POA: Insufficient documentation

## 2021-03-20 DIAGNOSIS — D509 Iron deficiency anemia, unspecified: Secondary | ICD-10-CM | POA: Insufficient documentation

## 2021-03-20 DIAGNOSIS — G5602 Carpal tunnel syndrome, left upper limb: Secondary | ICD-10-CM | POA: Insufficient documentation

## 2021-03-20 DIAGNOSIS — I10 Essential (primary) hypertension: Secondary | ICD-10-CM | POA: Insufficient documentation

## 2021-03-20 DIAGNOSIS — G4730 Sleep apnea, unspecified: Secondary | ICD-10-CM | POA: Diagnosis not present

## 2021-03-20 DIAGNOSIS — E785 Hyperlipidemia, unspecified: Secondary | ICD-10-CM | POA: Insufficient documentation

## 2021-03-20 DIAGNOSIS — Z7951 Long term (current) use of inhaled steroids: Secondary | ICD-10-CM | POA: Diagnosis not present

## 2021-03-20 DIAGNOSIS — E119 Type 2 diabetes mellitus without complications: Secondary | ICD-10-CM | POA: Diagnosis not present

## 2021-03-20 DIAGNOSIS — K219 Gastro-esophageal reflux disease without esophagitis: Secondary | ICD-10-CM | POA: Diagnosis not present

## 2021-03-20 DIAGNOSIS — J449 Chronic obstructive pulmonary disease, unspecified: Secondary | ICD-10-CM | POA: Insufficient documentation

## 2021-03-20 DIAGNOSIS — Z6841 Body Mass Index (BMI) 40.0 and over, adult: Secondary | ICD-10-CM | POA: Insufficient documentation

## 2021-03-20 HISTORY — PX: CARPAL TUNNEL RELEASE: SHX101

## 2021-03-20 LAB — GLUCOSE, CAPILLARY
Glucose-Capillary: 190 mg/dL — ABNORMAL HIGH (ref 70–99)
Glucose-Capillary: 223 mg/dL — ABNORMAL HIGH (ref 70–99)

## 2021-03-20 SURGERY — RELEASE, CARPAL TUNNEL, ENDOSCOPIC
Anesthesia: General | Site: Wrist | Laterality: Left

## 2021-03-20 MED ORDER — ACETAMINOPHEN 10 MG/ML IV SOLN: 1000.0000 mg | Freq: Once | INTRAVENOUS | Status: DC | PRN

## 2021-03-20 MED ORDER — SUCCINYLCHOLINE 20MG/ML (10ML) SYRINGE FOR MEDFUSION PUMP - OPTIME
INTRAMUSCULAR | Status: DC | PRN
  Administered 2021-03-20: 130 mg via INTRAVENOUS

## 2021-03-20 MED ORDER — OXYCODONE HCL 5 MG/5ML PO SOLN: 5.0000 mg | Freq: Once | ORAL | Status: DC | PRN

## 2021-03-20 MED ORDER — LACTATED RINGERS IV SOLN: INTRAVENOUS | Status: DC | PRN

## 2021-03-20 MED ORDER — ONDANSETRON HCL 4 MG/2ML IJ SOLN
INTRAMUSCULAR | Status: DC | PRN
  Administered 2021-03-20: 4 mg via INTRAVENOUS

## 2021-03-20 MED ORDER — PHENYLEPHRINE HCL (PRESSORS) 10 MG/ML IV SOLN
INTRAVENOUS | Status: DC | PRN
  Administered 2021-03-20 (×3): 80 ug via INTRAVENOUS

## 2021-03-20 MED ORDER — PROPOFOL 10 MG/ML IV BOLUS
INTRAVENOUS | Status: DC | PRN
  Administered 2021-03-20: 25 mg via INTRAVENOUS
  Administered 2021-03-20: 150 mg via INTRAVENOUS
  Administered 2021-03-20: 40 mg via INTRAVENOUS

## 2021-03-20 MED ORDER — BUPIVACAINE HCL (PF) 0.5 % IJ SOLN
INTRAMUSCULAR | Status: AC
  Filled 2021-03-20: qty 30

## 2021-03-20 MED ORDER — ACETAMINOPHEN 10 MG/ML IV SOLN
INTRAVENOUS | Status: AC
  Filled 2021-03-20: qty 100

## 2021-03-20 MED ORDER — FENTANYL CITRATE (PF) 100 MCG/2ML IJ SOLN
INTRAMUSCULAR | Status: AC
  Filled 2021-03-20: qty 2

## 2021-03-20 MED ORDER — FENTANYL CITRATE (PF) 100 MCG/2ML IJ SOLN
INTRAMUSCULAR | Status: DC | PRN
Start: 2021-03-20 — End: 2021-03-20
  Administered 2021-03-20: 50 ug via INTRAVENOUS

## 2021-03-20 MED ORDER — FENTANYL CITRATE (PF) 100 MCG/2ML IJ SOLN: 25.0000 ug | INTRAMUSCULAR | Status: DC | PRN

## 2021-03-20 MED ORDER — IPRATROPIUM-ALBUTEROL 0.5-2.5 (3) MG/3ML IN SOLN
RESPIRATORY_TRACT | Status: AC
  Filled 2021-03-20: qty 3

## 2021-03-20 MED ORDER — ALBUTEROL SULFATE HFA 108 (90 BASE) MCG/ACT IN AERS
INHALATION_SPRAY | RESPIRATORY_TRACT | Status: AC
  Filled 2021-03-20: qty 6.7

## 2021-03-20 MED ORDER — EPHEDRINE 5 MG/ML INJ
INTRAVENOUS | Status: AC
  Filled 2021-03-20: qty 5

## 2021-03-20 MED ORDER — LIDOCAINE HCL (CARDIAC) PF 100 MG/5ML IV SOSY
PREFILLED_SYRINGE | INTRAVENOUS | Status: DC | PRN
  Administered 2021-03-20: 50 mg via INTRAVENOUS

## 2021-03-20 MED ORDER — ONDANSETRON HCL 4 MG/2ML IJ SOLN: 4.0000 mg | Freq: Once | INTRAMUSCULAR | Status: DC | PRN

## 2021-03-20 MED ORDER — CHLORHEXIDINE GLUCONATE 0.12 % MT SOLN: 15.0000 mL | Freq: Once | OROMUCOSAL | Status: AC

## 2021-03-20 MED ORDER — CHLORHEXIDINE GLUCONATE 0.12 % MT SOLN
OROMUCOSAL | Status: AC
  Administered 2021-03-20: 15 mL via OROMUCOSAL
  Filled 2021-03-20: qty 15

## 2021-03-20 MED ORDER — ORAL CARE MOUTH RINSE: 15.0000 mL | Freq: Once | OROMUCOSAL | Status: AC

## 2021-03-20 MED ORDER — PROPOFOL 10 MG/ML IV BOLUS
INTRAVENOUS | Status: AC
  Filled 2021-03-20: qty 20

## 2021-03-20 MED ORDER — PHENYLEPHRINE HCL-NACL 20-0.9 MG/250ML-% IV SOLN
INTRAVENOUS | Status: AC
  Filled 2021-03-20: qty 250

## 2021-03-20 MED ORDER — KETOROLAC TROMETHAMINE 30 MG/ML IJ SOLN
INTRAMUSCULAR | Status: DC | PRN
  Administered 2021-03-20: 30 mg via INTRAVENOUS

## 2021-03-20 MED ORDER — BUPIVACAINE HCL (PF) 0.5 % IJ SOLN
INTRAMUSCULAR | Status: DC | PRN
  Administered 2021-03-20: 10 mL

## 2021-03-20 MED ORDER — ACETAMINOPHEN 10 MG/ML IV SOLN
INTRAVENOUS | Status: DC | PRN
  Administered 2021-03-20: 1000 mg via INTRAVENOUS

## 2021-03-20 MED ORDER — IPRATROPIUM-ALBUTEROL 0.5-2.5 (3) MG/3ML IN SOLN: 3.0000 mL | RESPIRATORY_TRACT | Status: DC

## 2021-03-20 MED ORDER — ONDANSETRON HCL 4 MG/2ML IJ SOLN
INTRAMUSCULAR | Status: AC
  Filled 2021-03-20: qty 2

## 2021-03-20 MED ORDER — PROPOFOL 10 MG/ML IV BOLUS
INTRAVENOUS | Status: AC
  Filled 2021-03-20: qty 40

## 2021-03-20 MED ORDER — OXYCODONE HCL 5 MG PO TABS: 5.0000 mg | ORAL_TABLET | Freq: Once | ORAL | Status: DC | PRN

## 2021-03-20 MED ORDER — 0.9 % SODIUM CHLORIDE (POUR BTL) OPTIME
TOPICAL | Status: DC | PRN
  Administered 2021-03-20: 250 mL

## 2021-03-20 MED ORDER — KETOROLAC TROMETHAMINE 30 MG/ML IJ SOLN
INTRAMUSCULAR | Status: AC
  Filled 2021-03-20: qty 1

## 2021-03-20 MED ORDER — DEXAMETHASONE SODIUM PHOSPHATE 10 MG/ML IJ SOLN
INTRAMUSCULAR | Status: DC | PRN
  Administered 2021-03-20: 5 mg via INTRAVENOUS

## 2021-03-20 MED ORDER — HYDROCODONE-ACETAMINOPHEN 5-325 MG PO TABS
1.0000 | ORAL_TABLET | Freq: Four times a day (QID) | ORAL | 0 refills | Status: DC | PRN
Start: 2021-03-20 — End: 2022-08-25

## 2021-03-20 MED ORDER — IPRATROPIUM-ALBUTEROL 0.5-2.5 (3) MG/3ML IN SOLN
3.0000 mL | Freq: Once | RESPIRATORY_TRACT | Status: AC
  Administered 2021-03-20: 3 mL via RESPIRATORY_TRACT

## 2021-03-20 MED ORDER — IPRATROPIUM-ALBUTEROL 0.5-2.5 (3) MG/3ML IN SOLN
RESPIRATORY_TRACT | Status: AC
  Administered 2021-03-20: 3 mL via RESPIRATORY_TRACT
  Filled 2021-03-20: qty 3

## 2021-03-20 MED ORDER — ALBUTEROL SULFATE HFA 108 (90 BASE) MCG/ACT IN AERS
INHALATION_SPRAY | RESPIRATORY_TRACT | Status: DC | PRN
  Administered 2021-03-20: 2 via RESPIRATORY_TRACT

## 2021-03-20 MED ORDER — IPRATROPIUM-ALBUTEROL 0.5-2.5 (3) MG/3ML IN SOLN: 3.0000 mL | Freq: Once | RESPIRATORY_TRACT | Status: AC

## 2021-03-20 MED ORDER — MIDAZOLAM HCL 2 MG/2ML IJ SOLN
INTRAMUSCULAR | Status: AC
  Filled 2021-03-20: qty 2

## 2021-03-20 MED ORDER — DEXAMETHASONE SODIUM PHOSPHATE 10 MG/ML IJ SOLN
INTRAMUSCULAR | Status: AC
  Filled 2021-03-20: qty 1

## 2021-03-20 MED ORDER — MIDAZOLAM HCL 2 MG/2ML IJ SOLN
INTRAMUSCULAR | Status: DC | PRN
  Administered 2021-03-20: .5 mg via INTRAVENOUS

## 2021-03-20 SURGICAL SUPPLY — 27 items
APL PRP STRL LF DISP 70% ISPRP (MISCELLANEOUS) ×2
BNDG COHESIVE 4X5 TAN ST LF (GAUZE/BANDAGES/DRESSINGS) ×2 IMPLANT
BNDG ELASTIC 2X5.8 VLCR STR LF (GAUZE/BANDAGES/DRESSINGS) ×2 IMPLANT
BNDG ESMARK 4X12 TAN STRL LF (GAUZE/BANDAGES/DRESSINGS) ×2 IMPLANT
CHLORAPREP W/TINT 26 (MISCELLANEOUS) ×3 IMPLANT
CORD BIP STRL DISP 12FT (MISCELLANEOUS) ×2 IMPLANT
CUFF TOURN SGL QUICK 18X4 (TOURNIQUET CUFF) ×1 IMPLANT
DRAPE SURG 17X11 SM STRL (DRAPES) ×2 IMPLANT
FORCEPS JEWEL BIP 4-3/4 STR (INSTRUMENTS) ×2 IMPLANT
GAUZE 4X4 16PLY ~~LOC~~+RFID DBL (SPONGE) ×2 IMPLANT
GAUZE SPONGE 4X4 12PLY STRL (GAUZE/BANDAGES/DRESSINGS) ×2 IMPLANT
GAUZE XEROFORM 1X8 LF (GAUZE/BANDAGES/DRESSINGS) ×2 IMPLANT
GLOVE SURG ENC MOIS LTX SZ8 (GLOVE) ×2 IMPLANT
GLOVE SURG UNDER LTX SZ8 (GLOVE) ×2 IMPLANT
GOWN STRL REUS W/ TWL LRG LVL3 (GOWN DISPOSABLE) ×1 IMPLANT
GOWN STRL REUS W/ TWL XL LVL3 (GOWN DISPOSABLE) ×1 IMPLANT
GOWN STRL REUS W/TWL LRG LVL3 (GOWN DISPOSABLE) ×2
GOWN STRL REUS W/TWL XL LVL3 (GOWN DISPOSABLE) ×2
KIT CARPAL TUNNEL (MISCELLANEOUS) ×2
KIT ESCP INSRT D SLOT CANN KN (MISCELLANEOUS) ×1 IMPLANT
KIT TURNOVER KIT A (KITS) ×2 IMPLANT
MANIFOLD NEPTUNE II (INSTRUMENTS) ×2 IMPLANT
NS IRRIG 500ML POUR BTL (IV SOLUTION) ×2 IMPLANT
PACK EXTREMITY ARMC (MISCELLANEOUS) ×2 IMPLANT
SPLINT WRIST LG LT TX990309 (SOFTGOODS) ×1 IMPLANT
STOCKINETTE IMPERVIOUS 9X36 MD (GAUZE/BANDAGES/DRESSINGS) ×2 IMPLANT
SUT PROLENE 4 0 PS 2 18 (SUTURE) ×2 IMPLANT

## 2021-03-20 NOTE — Op Note (Addendum)
03/20/2021  9:34 AM  Patient:   Christine Lawson  Pre-Op Diagnosis:   Left carpal tunnel syndrome.  Post-Op Diagnosis:   Same.  Procedure:   Endoscopic left carpal tunnel release.  Surgeon:   Pascal Lux, MD  Anesthesia:   GET  Findings:   As above.  Complications:   None  EBL:   0 cc  Fluids:   600 cc crystalloid  TT:   16 minutes at 250 mmHg  Drains:   None  Closure:   4-0 Prolene interrupted sutures  Brief Clinical Note:   The patient is a 61 year old female with a history progressive worsening pain and paresthesias to her left hand. Her symptoms have progressed despite medications, activity modification, etc. Her history and examination consistent with carpal tunnel syndrome, confirmed by EMG. The patient presents at this time for an endoscopic left carpal tunnel release.   Procedure:   The patient was brought into the operating room and lain in the supine position. After adequate general endotracheal intubation and anesthesia was obtained, the left hand and upper extremity were prepped with ChloraPrep solution before being draped sterilely. Preoperative antibiotics were administered. A timeout was performed to verify the appropriate surgical site before the limb was exsanguinated with an Esmarch and the tourniquet inflated to 250 mmHg. An approximately 1.5-2 cm incision was made over the volar wrist flexion crease, centered over the palmaris longus tendon. The incision was carried down through the subcutaneous tissues with care taken to identify and protect any neurovascular structures. The distal forearm fascia was penetrated just proximal to the transverse carpal ligament. The soft tissues were released off the superficial and deep surfaces of the distal forearm fascia and this was released proximally for 3-4 cm under direct visualization.  Attention was directed distally. The Soil scientist was passed beneath the transverse carpal ligament along the ulnar aspect of the  carpal tunnel and used to release any adhesions as well as to remove any adherent synovial tissue before first the smaller then the larger of the two dilators were passed beneath the transverse carpal ligament along the ulnar margin of the carpal tunnel. The slotted cannula was introduced and the endoscope was placed into the slotted cannula and the undersurface of the transverse carpal ligament visualized. The distal margin of the transverse carpal ligament was marked by placing a 25-gauge needle percutaneously at [place] cardinal point so that it entered the distal portion of the slotted cannula. Under endoscopic visualization, the transverse carpal ligament was released from proximal to distal using the end-cutting blade. A second pass was performed to ensure complete release of the ligament. The adequacy of release was verified both endoscopically and by palpation using the freer elevator.  The wound was irrigated thoroughly with sterile saline solution before being closed using 4-0 Prolene interrupted sutures. A total of 10 cc of 0.5% plain Sensorcaine was injected in and around the incision before a sterile bulky dressing was applied to the wound. The patient was placed into a volar wrist splint before being awakened, extubated, and returned to the recovery room in satisfactory condition after tolerating the procedure well.

## 2021-03-20 NOTE — Transfer of Care (Signed)
Immediate Anesthesia Transfer of Care Note  Patient: Christine Lawson  Procedure(s) Performed: CARPAL TUNNEL RELEASE ENDOSCOPIC (Left: Wrist)  Patient Location: PACU  Anesthesia Type:General  Level of Consciousness: awake  Airway & Oxygen Therapy: Patient Spontanous Breathing and Patient connected to face mask oxygen  Post-op Assessment: Report given to RN and Post -op Vital signs reviewed and stable  Post vital signs: Reviewed and stable  Last Vitals:  Vitals Value Taken Time  BP 147/60 03/20/21 0947  Temp    Pulse 96 03/20/21 0949  Resp 21 03/20/21 0949  SpO2 96 % 03/20/21 0949  Vitals shown include unvalidated device data.  Last Pain:  Vitals:   03/20/21 0734  TempSrc: Temporal  PainSc: 6          Complications: No notable events documented.

## 2021-03-20 NOTE — Progress Notes (Signed)
°   03/20/21 0700  Clinical Encounter Type  Visited With Patient  Visit Type Initial;Pre-op;Spiritual support  Spiritual Encounters  Spiritual Needs Prayer   Chaplain gave support through compassionate presence and prayer for patient and upcoming surgery,

## 2021-03-20 NOTE — Anesthesia Postprocedure Evaluation (Signed)
Anesthesia Post Note  Patient: Christine Lawson  Procedure(s) Performed: CARPAL TUNNEL RELEASE ENDOSCOPIC (Left: Wrist)  Patient location during evaluation: PACU Anesthesia Type: General Level of consciousness: awake and alert Pain management: pain level controlled Vital Signs Assessment: post-procedure vital signs reviewed and stable Respiratory status: spontaneous breathing, nonlabored ventilation, respiratory function stable and patient connected to nasal cannula oxygen Cardiovascular status: blood pressure returned to baseline and stable Postop Assessment: no apparent nausea or vomiting Anesthetic complications: no   No notable events documented.   Last Vitals:  Vitals:   03/20/21 1145 03/20/21 1207  BP: (!) 167/52 (!) 167/64  Pulse: 98 (!) 103  Resp: 18 17  Temp:  36.7 C  SpO2: 94% 98%    Last Pain:  Vitals:   03/20/21 1207  TempSrc: Temporal  PainSc: 0-No pain                 Arita Miss

## 2021-03-20 NOTE — H&P (Signed)
History of Present Illness: Christine Lawson is a 61 y.o. female who presents today for her history and physical. The patient scheduled for a left endoscopic carpal tunnel release procedure with Dr. Roland Rack on 03/20/2021. The patient denies any changes in her medical history since she was last evaluated. Patient denies any falls or trauma since she was last seen. She is taking Tylenol as needed for discomfort. She does report numbness and tingling in the left hand. The patient did undergo a nerve conduction study of the left upper extremity which demonstrated chronic mild-moderate carpal tunnel on the left wrist. After discussion of the risk and benefits the patient elected to proceed with a endoscopic carpal tunnel release procedure. The patient reports a pain score of 6 out of 10 in the left wrist at today's visit. She denies any personal history of heart attack or stroke. The patient does have a history of COPD, she uses an inhaler as needed.  Past Medical History:  Anemia (low iron, IV iron)   Arthritis   Asthma without status asthmaticus, unspecified   COPD (chronic obstructive pulmonary disease) (CMS-HCC)   Diabetes mellitus type 2, uncomplicated (CMS-HCC)   Fatty liver   Gastroesophageal reflux disease with esophagitis 01/22/2018   GERD (gastroesophageal reflux disease)   Hyperlipidemia   Hypertension   IBS (irritable bowel syndrome)   Irritable bowel syndrome with diarrhea 04/03/2016   Obesity   Sleep apnea   Thrombocytosis   Uterine cancer (CMS-HCC)   Past Surgical History:  Eye Surgery 2005 (Dr. Sandra Cockayne)   EGD 12/25/2010 (No repeat per RTE)   TAH/BSO 04/08/2011 (Dr. Ouida Sills)   COLONOSCOPY 09/15/2011 (12/25/2010, 10/11/2003; Int Hemorrhoids: CBF 08/2021)   Medial Chondroplasty 03/19/2012   COLONOSCOPY 08/02/2015 (Int Hemorrhoids: CBF 08/2025)   EGD 08/02/2015 (Gastritis, Esophagitis: No repeat per RTE)   HYSTERECTOMY (TAH BSO)   KNEE ARTHROSCOPY Right   ORAL SURGERY OPERATIVE  NOTE   Partial Medial Menisectomy   Past Family History:  No Known Problems Mother   Diabetes Father   Epilepsy Brother   Seizures Brother   Gout Maternal Grandmother   Heart disease Maternal Grandmother   No Known Problems Maternal Grandfather   Diabetes Paternal Grandmother   No Known Problems Paternal Grandfather   Diabetes Sister   High blood pressure (Hypertension) Sister   No Known Problems Sister   No Known Problems Sister   No Known Problems Sister   Heart disease Brother   Other Brother limp r/t GSW   No Known Problems Brother   No Known Problems Sister   Medications:  albuterol 90 mcg/actuation inhaler Inhale 2 inhalations into the lungs every 6 (six) hours as needed for Wheezing 1 each 6   allopurinol (ZYLOPRIM) 100 MG tablet Take 100 mg by mouth once daily.   amLODIPine (NORVASC) 10 MG tablet TAKE 1 TABLET BY MOUTH DAILY 90 tablet 1   ascorbic acid, vitamin C, (VITAMIN C) 500 MG tablet Take 1 tablet by mouth once daily   aspirin 81 MG EC tablet Take 81 mg by mouth once daily.   celecoxib (CELEBREX) 100 MG capsule Take 1 capsule (100 mg total) by mouth 2 (two) times daily 60 capsule 1   cholecalciferol (VITAMIN D3) 1000 unit tablet Take 1,000 Units by mouth once daily.   cyanocobalamin (VITAMIN B12) 500 MCG tablet Take 500 mcg by mouth once daily.   diclofenac (VOLTAREN) 1 % topical gel Apply 2 g topically 2 (two) times daily 100 g 1   enalapril (VASOTEC) 5 MG  tablet Take 1 tablet (5 mg total) by mouth once daily 90 tablet 1   ferrous sulfate 325 (65 FE) MG tablet Take 325 mg by mouth daily with breakfast.   insulin LISPRO (HUMALOG KWIKPEN INSULIN) pen injector (concentration 100 units/mL) INJECT 40 UNITS WITH BREAKFAST & LUNCH, THEN 70 UNITS WITH DINNER (Patient taking differently: INJECT 26 UNITS WITH BREAKFAST, 24 units before LUNCH, THEN 60 UNITS WITH DINNER) 45 mL 6   lifitegrast (XIIDRA) 5 % ophthalmic solution Apply to eye Apply 1 drop to eye as needed for dry  eyes.   loperamide (IMODIUM) 2 mg capsule Take 2 mg by mouth 2 (two) times daily   metFORMIN (GLUCOPHAGE-XR) 500 MG XR tablet Take 1 tablet (500 mg total) by mouth 2 (two) times daily 180 tablet 2   montelukast (SINGULAIR) 10 mg tablet TAKE 1 TABLET (10 MG TOTAL) BY MOUTH NIGHTLY 90 tablet 1   omeprazole (PRILOSEC) 40 MG DR capsule TAKE 1 CAPSULE BY MOUTH DAILY 30 MINUTES BEFORE BREAKFAST 90 capsule 3   pantoprazole (PROTONIX) 40 MG DR tablet Take 1 tablet (40 mg total) by mouth once daily 90 tablet 1   pregabalin (LYRICA) 25 MG capsule TAKE 1 CAPSULE (25 MG TOTAL) BY MOUTH 2 (TWO) TIMES DAILY 60 capsule 12   rosuvastatin (CRESTOR) 5 MG tablet TAKE 1 TABLET (5 MG TOTAL) BY MOUTH ONCE DAILY 90 tablet 1   sucralfate (CARAFATE) 1 gram tablet Take 1 tablet (1 g total) by mouth 2 (two) times daily before meals Take 1 hour before meals 60 tablet 3   SYMBICORT 80-4.5 mcg/actuation inhaler INHALE 2 INHALATIONS INTO THE LUNGS 2 (TWO) TIMES DAILY 30.6 g 6   TOUJEO MAX U-300 SOLOSTAR 300 unit/mL (3 mL) InPn INJECT 120 UNITS SUBCUTANEOUSLY ONCE DAILY (Patient taking differently: Inject 80-120 Units subcutaneously once daily) 12 mL 12   UNABLE TO FIND Take 2 tablets by mouth once daily Peppermint Oil (IBGARD PO)   L. RHAMNOSUS GG/INULIN (CULTURELLE PROBIOTICS ORAL) Take 1 tablet by mouth 3 (three) times daily (Patient not taking: Reported on 12/19/2020)   Allergies:  Vesicare [Solifenacin] Dizziness   Bentyl [Dicyclomine] Dizziness   Iodinated Contrast Media Other (See Comments)   Isovue-128 [Iopamidol] Other (Wheezing)   Review of Systems:  A comprehensive 14 point ROS was performed, reviewed by me today, and the pertinent orthopaedic findings are documented in the HPI.  Physical Exam: BP 136/84 (BP Location: Right upper arm, Patient Position: Sitting, BP Cuff Size: Large Adult)   Ht 160 cm (5\' 3" )   Wt (!) 123.2 kg (271 lb 9.6 oz)   LMP (LMP Unknown)   BMI 48.11 kg/m  General/Constitutional: The  patient appears to be well-nourished, well-developed, and in no acute distress. Neuro/Psych: Normal mood and affect, oriented to person, place and time. Eyes: Non-icteric. Pupils are equal, round, and reactive to light, and exhibit synchronous movement. ENT: Unremarkable. Lymphatic: No palpable adenopathy. Respiratory: Lungs clear to auscultation, Normal chest excursion, No wheezes and Non-labored breathing Cardiovascular: Regular rate and rhythm. No murmurs. and No edema, swelling or tenderness, except as noted in detailed exam. Integumentary: No impressive skin lesions present, except as noted in detailed exam. Musculoskeletal: Unremarkable, except as noted in detailed exam.  Neck: Neck has full range of motion. There is no tenderness to palpation. Spurling's test is negative.   Left Upper Extremity: Normal shoulder contour. Good active and passive range of motion and stability of the shoulder, elbow, and wrist. Normal motion of the hand and digits. No  swelling, erythema, or ecchymosis is noted. There is no triggering or locking of the digits noted. No thenar atrophy is visualized. No intrinsic wasting. The patient has a positive Phalen's test. The patient has a positive Tinel's test. The patient has decreased pinprick and light touch sensation in the median nerve distribution. There is normal grip strength and pincer strength. The patient has less than 2 second capillary refill with good skin warmth. Normal radial and ulnar pulse is palpated.   Neurologic: Sensory function is intact, except as noted above. Motor strength is 5/5, except as noted above. No tremor or clonus is present. Good motor coordination is noted.   Imaging: Nerve conduction study of the left upper extremity was performed on 01/28/2021. This nerve conduction study demonstrated evidence of chronic, mild to moderate left carpal tunnel syndrome. The results were discussed today with the patient and the patient's sister was also  on the phone to go over her results.  Impression: Left carpal tunnel syndrome. Class 3 severe obesity due to excess calories with serious comorbidity in adult, unspecified BMI.  Plan:  1. Treatment options were discussed today with the patient. 2. Patient is scheduled to undergo a left endoscopic carpal tunnel release procedure with Dr. Roland Rack on 03/20/2021 3. Discussed the risk and benefits of the procedure with the patient. The patient would like to proceed with surgery at this time. 4. This document will serve as a surgical history and physical for the patient. 5. The patient will follow-up per standard postop protocol. 6. She can contact the clinic if she has any questions, new symptoms develop or symptoms worsen.  The procedure was discussed with the patient, as were the potential risks (including bleeding, infection, nerve and/or blood vessel injury, persistent or recurrent pain, failure of the release, progression of arthritis, need for further surgery, blood clots, strokes, heart attacks and/or arhythmias, pneumonia, etc.) and benefits. The patient states her understanding and wishes to proceed.   H&P reviewed and patient re-examined. No changes.

## 2021-03-20 NOTE — Anesthesia Preprocedure Evaluation (Signed)
Anesthesia Evaluation  Patient identified by MRN, date of birth, ID band Patient awake    Reviewed: Allergy & Precautions, H&P , NPO status , Patient's Chart, lab work & pertinent test results, reviewed documented beta blocker date and time   History of Anesthesia Complications Negative for: history of anesthetic complications  Airway Mallampati: II  TM Distance: >3 FB Neck ROM: full  Mouth opening: Limited Mouth Opening  Dental  (+) Edentulous Upper, Edentulous Lower   Pulmonary asthma , sleep apnea and Continuous Positive Airway Pressure Ventilation , neg COPD, Patient abstained from smoking.Not current smoker,  Patient is supposed to take singulair daily but does not. She provides some unclear history of trouble breathing perioperatively at her last procedure    + decreased breath sounds      Cardiovascular Exercise Tolerance: Good METShypertension, (-) CAD and (-) Past MI (-) dysrhythmias  Rhythm:Regular Rate:Normal - Systolic murmurs    Neuro/Psych negative neurological ROS  negative psych ROS   GI/Hepatic Neg liver ROS, GERD  Medicated and Controlled,  Endo/Other  diabetesMorbid obesity  Renal/GU negative Renal ROS  negative genitourinary   Musculoskeletal   Abdominal (+) + obese,   Peds  Hematology negative hematology ROS (+)   Anesthesia Other Findings Past Medical History:   Endometrial adenocarcinoma (HCC)                             GERD (gastroesophageal reflux disease)                       Hypertension                                                 Diabetes mellitus without complication (HCC)                 Obesity                                                      Irritable bowel syndrome                                     Hematochezia                                                 IDA (iron deficiency anemia)                                 Hyperlipidemia                                                Osteoarthritis  Inguinal lymphadenopathy                        07/13/2015  Past Surgical History:   ABDOMINAL HYSTERECTOMY                           feb 2013     Reproductive/Obstetrics                             Anesthesia Physical  Anesthesia Plan  ASA: 3  Anesthesia Plan: General   Post-op Pain Management: Ofirmev IV (intra-op) and Toradol IV (intra-op)   Induction: Intravenous  PONV Risk Score and Plan: 3 and Ondansetron, Dexamethasone and Midazolam  Airway Management Planned: LMA and Oral ETT  Additional Equipment: None  Intra-op Plan:   Post-operative Plan: Extubation in OR  Informed Consent: I have reviewed the patients History and Physical, chart, labs and discussed the procedure including the risks, benefits and alternatives for the proposed anesthesia with the patient or authorized representative who has indicated his/her understanding and acceptance.     Dental Advisory Given  Plan Discussed with: CRNA  Anesthesia Plan Comments: (Discussed risks of anesthesia with patient, including PONV, sore throat, lip/dental/eye damage. Rare risks discussed as well, such as cardiorespiratory and neurological sequelae, and allergic reactions. Discussed the role of CRNA in patient's perioperative care. Patient understands. Patient informed about increased incidence of above perioperative risk due to high BMI. Patient understands.  Will administer preop duoneb due to missing singulair doses , and hx of prior respiratory issues perioperatively (per patient))        Anesthesia Quick Evaluation

## 2021-03-20 NOTE — Discharge Instructions (Addendum)
Orthopedic discharge instructions: Keep dressing dry and intact. Keep hand elevated above heart level. May shower after dressing removed on postop day 4 (Sunday). Cover sutures with Band-Aids after drying off. Apply ice to affected area frequently. Take ibuprofen 600 mg TID with meals for 7-10 days, then as necessary. Take ES Tylenol or pain medication as prescribed when needed.  Return for follow-up in 10-14 days or as scheduled.AMBULATORY SURGERY  DISCHARGE INSTRUCTIONS   The drugs that you were given will stay in your system until tomorrow so for the next 24 hours you should not:  Drive an automobile Make any legal decisions Drink any alcoholic beverage   You may resume regular meals tomorrow.  Today it is better to start with liquids and gradually work up to solid foods.  You may eat anything you prefer, but it is better to start with liquids, then soup and crackers, and gradually work up to solid foods.   Please notify your doctor immediately if you have any unusual bleeding, trouble breathing, redness and pain at the surgery site, drainage, fever, or pain not relieved by medication.    Additional Instructions:   Please contact your physician with any problems or Same Day Surgery at [phone], Monday through Friday 6 am to 4 pm, or [place] at [facility] number at [phone].

## 2021-03-20 NOTE — Anesthesia Procedure Notes (Signed)
Procedure Name: Intubation Date/Time: 03/20/2021 8:48 AM Performed by: Loni Dolly, CRNA Pre-anesthesia Checklist: Patient identified, Emergency Drugs available, Suction available and Patient being monitored Patient Re-evaluated:Patient Re-evaluated prior to induction Oxygen Delivery Method: Circle system utilized Preoxygenation: Pre-oxygenation with 100% oxygen Induction Type: IV induction Ventilation: Mask ventilation without difficulty Laryngoscope Size: McGraph and 3 Tube type: Oral Tube size: 6.5 mm Number of attempts: 1 Airway Equipment and Method: Stylet and Oral airway Placement Confirmation: ETT inserted through vocal cords under direct vision, positive ETCO2 and breath sounds checked- equal and bilateral Secured at: 21 cm Tube secured with: Tape Dental Injury: Teeth and Oropharynx as per pre-operative assessment  Comments: Patient laryngospasmed with insertion of 3.5 LMA, placed 6.5 ETT without difficulty

## 2021-03-21 ENCOUNTER — Encounter: Payer: Self-pay | Admitting: Surgery

## 2021-04-01 ENCOUNTER — Encounter: Payer: Self-pay | Admitting: Internal Medicine

## 2021-04-03 ENCOUNTER — Encounter: Payer: Self-pay | Admitting: Internal Medicine

## 2021-04-08 ENCOUNTER — Ambulatory Visit: Payer: Commercial Managed Care - HMO | Admitting: Dietician

## 2021-04-09 ENCOUNTER — Other Ambulatory Visit: Payer: Self-pay

## 2021-04-09 ENCOUNTER — Encounter: Payer: Self-pay | Admitting: Occupational Therapy

## 2021-04-09 ENCOUNTER — Ambulatory Visit: Payer: Medicare Other | Attending: Student | Admitting: Occupational Therapy

## 2021-04-09 DIAGNOSIS — R202 Paresthesia of skin: Secondary | ICD-10-CM | POA: Diagnosis present

## 2021-04-09 DIAGNOSIS — R200 Anesthesia of skin: Secondary | ICD-10-CM | POA: Insufficient documentation

## 2021-04-09 DIAGNOSIS — M6281 Muscle weakness (generalized): Secondary | ICD-10-CM

## 2021-04-09 DIAGNOSIS — M79642 Pain in left hand: Secondary | ICD-10-CM

## 2021-04-09 DIAGNOSIS — L905 Scar conditions and fibrosis of skin: Secondary | ICD-10-CM

## 2021-04-09 DIAGNOSIS — M25532 Pain in left wrist: Secondary | ICD-10-CM

## 2021-04-09 DIAGNOSIS — G5602 Carpal tunnel syndrome, left upper limb: Secondary | ICD-10-CM | POA: Diagnosis present

## 2021-04-09 NOTE — Therapy (Signed)
[facility] [address] [place], [address] Phone: [phone]   Fax:  [phone]  Occupational Therapy Evaluation  Patient Details  Name: Christine Lawson MRN: [id number] Date of Birth: 02/11/1961 Referring Provider (OT): DR Poggi   Encounter Date: 04/09/2021   OT End of Session - 04/09/21 1446     Visit Number 1    Number of Visits 14    Date for OT Re-Evaluation 06/04/21    OT Start Time 1400    OT Stop Time 1440    OT Time Calculation (min) 40 min    Activity Tolerance Patient tolerated treatment well    Behavior During Therapy [facility] for tasks assessed/performed             Past Medical History:  Diagnosis Date   Diabetes mellitus without complication ([place])    Endometrial adenocarcinoma ([place])    GERD (gastroesophageal reflux disease)    Hematochezia    Hyperlipidemia    Hypertension    IDA (iron deficiency anemia)    Inguinal lymphadenopathy 07/13/2015   Irritable bowel syndrome    Obesity    Osteoarthritis    Sleep apnea     Past Surgical History:  Procedure Laterality Date   ABDOMINAL HYSTERECTOMY  feb 2013   CARPAL TUNNEL RELEASE Left 03/20/2021   Procedure: CARPAL TUNNEL RELEASE ENDOSCOPIC;  Surgeon: Corky Mull, MD;  Location: [facility] ORS;  Service: Orthopedics;  Laterality: Left;   COLONOSCOPY WITH PROPOFOL N/A 08/02/2015   Procedure: COLONOSCOPY WITH PROPOFOL;  Surgeon: Manya Silvas, MD;  Location: [facility] ENDOSCOPY;  Service: Endoscopy;  Laterality: N/A;   ESOPHAGOGASTRODUODENOSCOPY (EGD) WITH PROPOFOL N/A 08/02/2015   Procedure: ESOPHAGOGASTRODUODENOSCOPY (EGD) WITH PROPOFOL;  Surgeon: Manya Silvas, MD;  Location: [facility] ENDOSCOPY;  Service: Endoscopy;  Laterality: N/A;    There were no vitals filed for this visit.   Subjective Assessment - 04/09/21 1401     Subjective  I did not know what you wanted me to do yer -  my hand and wrist are swollen , painfull and stiff- I am  wearing my wrist splint night time    Pertinent History Pt was seen at [facility] on 04/01/21 - for first postop appointment following a endoscopic left carpal tunnel release. Surgery was performed by Dr. Roland Rack on 03/20/2021. Overall the patient feels that she is doing well. She does report a 6 out of 10 pain score in the left wrist and hand but she is only taking Tylenol as needed for discomfort. The patient denies any falls or trauma affecting left wrist or hand since surgery. She denies any signs of infection at home such as fevers or any drainage from the left wrist incision site. The patient has not been doing much lifting pushing or pulling, she does report continued swelling in the left hand and fingers.Refer to OT    Patient Stated Goals Want my L hand swelling and  pain better so I can use it around the house    Currently in Pain? Yes    Pain Score 6     Pain Location --   wrist and hand   Pain Orientation Left    Pain Descriptors / Indicators Aching;Tender;Tightness;Numbness;Sore    Pain Type Surgical pain    Pain Onset More than a month ago    Pain Frequency Constant               [facility] OT Assessment - 04/09/21 0001  Assessment   Medical Diagnosis L arthroscopic CTR    Referring Provider (OT) DR Poggi    Onset Date/Surgical Date 03/20/21    Hand Dominance Right      Prior Function   Vocation On disability    Leisure On her tablet/IPAD or phone a lot during day      AROM   Left Wrist Extension 50 Degrees    Left Wrist Flexion 60 Degrees    Left Wrist Radial Deviation 22 Degrees    Left Wrist Ulnar Deviation 22 Degrees      Left Hand AROM   L Thumb Opposition to Index --   Opposition to side of 5th -but after contrast able to 2nd fold of 5th   L Index  MCP 0-90 70 Degrees    L Index PIP 0-100 90 Degrees    L Long  MCP 0-90 75 Degrees    L Long PIP 0-100 90 Degrees    L Ring  MCP 0-90 80 Degrees    L Ring PIP 0-100 90 Degrees    L Little  MCP 0-90 80 Degrees    L  Little PIP 0-100 90 Degrees                      OT Treatments/Exercises (OP) - 04/09/21 0001       LUE Contrast Bath   Time 8 minutes    Comments prior to review of HEP decrease edema and increase ROM            Review with pt and sister HEP- isotoner glove  Soft tissue massage -MC and CT spreads as well as webspace  AROM for wrist flexion, ext, RD, UD  And tendon glides  PA and RA AROM of thumb  Opposition to all digits  10reps  2 x day  Desensitization -massage , soft textures and rougher textures         OT Education - 04/09/21 1446     Education Details Findings of eval and HEP    Person(s) Educated Patient    Methods Explanation;Demonstration;Tactile cues;Verbal cues;Handout    Comprehension Verbal cues required;Returned demonstration;Verbalized understanding              OT Short Term Goals - 04/09/21 1722       OT SHORT TERM GOAL #1   Title Pain/numbness  on L wrist/ hand  decrease to less than 2/10 with use during day    Baseline 6/10 pain with use and AROM - numbness still in digits constant per pt    Time 4    Period Weeks    Status New    Target Date 05/07/21      OT SHORT TERM GOAL #2   Title Pt to be ind in HEP to decrease pain , increase AROM in wrist and digits to [facility] to use hand in eating , bathing    Baseline Pain increase to 6/10 at wrist , thumb and digits AROM - tender did now allow OT to touch hand or wrist until after contrast and review of HEP    Time 3    Status New    Target Date 04/30/21               OT Long Term Goals - 04/09/21 1725       OT LONG TERM GOAL #1   Title Pt L grip  strength increase to more than 50% compare to R hand to use L  hand  in bathing , turn doorknob without increase symptoms    Baseline 3 wks s/p . pain and tenderness over scar and hand - 6/10 oain with AROM    Time 8    Period Weeks    Status New    Target Date 06/04/21      OT LONG TERM GOAL #2   Title Strength and AROM  increase in L hand and wrist for pt to pull and push door, carry more than 5 lbs , cut food and hold dring without increase symptoms    Baseline 3 wks s/p - pain 6/10 in wrist and thumb - stender over scar    Time 8    Period Weeks    Status New    Target Date 06/04/21                   Plan - 04/09/21 1731     Clinical Impression Statement Pt present at OT tomorrow 3 wks s/p arthroscopic CTR on the L by Dr Roland Rack on 03/20/21 - pt report numbness in fingers , pain 4-6/10 at wrist , digits and thumb , tenderness and edema over scar , and in wrist and hand. Decrease AROM in all digits and wrist with decrease strength limiting her functional use of L hand in ADL's and IADl's -pt and sister ed on HEP and showed improvement in session with contrast , soft tissue, AROM - pt can benefit from skilled OT services    OT Occupational Profile and History Problem Focused Assessment - Including review of records relating to presenting problem    Occupational performance deficits (Please refer to evaluation for details): ADL's;IADL's;Play;Leisure;Social Participation    Body Structure / Function / Physical Skills ADL;Strength;Decreased knowledge of use of DME;Decreased knowledge of precautions;Flexibility;ROM;IADL;Edema;UE functional use;Pain;Scar mobility    Rehab Potential Good    Clinical Decision Making Limited treatment options, no task modification necessary    Comorbidities Affecting Occupational Performance: None    Modification or Assistance to Complete Evaluation  No modification of tasks or assist necessary to complete eval    OT Frequency 2x / week    OT Duration 8 weeks    OT Treatment/Interventions Self-care/ADL training;Ultrasound;Contrast Bath;Therapeutic exercise;Manual Therapy;Patient/family education;Passive range of motion;DME and/or AE instruction;Splinting;Fluidtherapy;Paraffin;Scar mobilization    Consulted and Agree with Plan of Care Patient             Patient will  benefit from skilled therapeutic intervention in order to improve the following deficits and impairments:   Body Structure / Function / Physical Skills: ADL, Strength, Decreased knowledge of use of DME, Decreased knowledge of precautions, Flexibility, ROM, IADL, Edema, UE functional use, Pain, Scar mobility       Visit Diagnosis: Pain in left hand - Plan: Ot plan of care cert/re-cert  Muscle weakness (generalized) - Plan: Ot plan of care cert/re-cert  Scar tissue - Plan: Ot plan of care cert/re-cert  Pain in left wrist - Plan: Ot plan of care cert/re-cert    Problem List Patient Active Problem List   Diagnosis Date Noted   Lymphedema 01/17/2016   Venous (peripheral) insufficiency 01/17/2016   Pain in limb 01/17/2016   Iron deficiency anemia due to chronic blood loss 01/09/2016   History of endometrial cancer 08/01/2015   Endometrial adenocarcinoma ([place]) 07/13/2015    Class: History of   Inguinal lymphadenopathy 07/13/2015   Long term current use of insulin ([place]) 12/12/2013   Microalbuminuria 12/12/2013   Diabetes ([place]) 07/28/2013   BP (  high blood pressure) 07/28/2013   HLD (hyperlipidemia) 07/28/2013   Adiposity 07/28/2013   Obstructive apnea 07/28/2013   Arthritis, degenerative 07/28/2013   Apnea, sleep 07/28/2013   Thyroid nodule 07/28/2013    Rosalyn Gess, OTR/L,CLT 04/09/2021, 5:35 PM  [facility] [address], [place], [address] Phone: [phone]   Fax:  [phone]  Name: Christine Lawson MRN: [id number] Date of Birth: 11-Mar-1960

## 2021-04-16 ENCOUNTER — Ambulatory Visit: Payer: Medicare Other | Admitting: Occupational Therapy

## 2021-04-16 ENCOUNTER — Other Ambulatory Visit: Payer: Self-pay

## 2021-04-16 DIAGNOSIS — M79642 Pain in left hand: Secondary | ICD-10-CM | POA: Diagnosis not present

## 2021-04-16 DIAGNOSIS — L905 Scar conditions and fibrosis of skin: Secondary | ICD-10-CM

## 2021-04-16 DIAGNOSIS — M6281 Muscle weakness (generalized): Secondary | ICD-10-CM

## 2021-04-16 DIAGNOSIS — R200 Anesthesia of skin: Secondary | ICD-10-CM

## 2021-04-16 DIAGNOSIS — M25532 Pain in left wrist: Secondary | ICD-10-CM

## 2021-04-16 DIAGNOSIS — G5602 Carpal tunnel syndrome, left upper limb: Secondary | ICD-10-CM

## 2021-04-16 NOTE — Therapy (Signed)
[facility] [address], [place], [address] Phone: [phone]   Fax:  [phone]  Occupational Therapy Treatment  Patient Details  Name: Christine Lawson MRN: [id number] Date of Birth: [birth year] Referring Provider (OT): DR Poggi   Encounter Date: 04/16/2021   OT End of Session - 04/16/21 1332     Visit Number 2    Number of Visits 14    Date for OT Re-Evaluation 06/04/21    OT Start Time 1315    OT Stop Time 2458    OT Time Calculation (min) 39 min    Activity Tolerance Patient tolerated treatment well    Behavior During Therapy [facility] for tasks assessed/performed             Past Medical History:  Diagnosis Date   Diabetes mellitus without complication ([place])    Endometrial adenocarcinoma ([place])    GERD (gastroesophageal reflux disease)    Hematochezia    Hyperlipidemia    Hypertension    IDA (iron deficiency anemia)    Inguinal lymphadenopathy 07/13/2015   Irritable bowel syndrome    Obesity    Osteoarthritis    Sleep apnea     Past Surgical History:  Procedure Laterality Date   ABDOMINAL HYSTERECTOMY  feb 2013   CARPAL TUNNEL RELEASE Left 03/20/2021   Procedure: CARPAL TUNNEL RELEASE ENDOSCOPIC;  Surgeon: Corky Mull, MD;  Location: [facility] ORS;  Service: Orthopedics;  Laterality: Left;   COLONOSCOPY WITH PROPOFOL N/A 08/02/2015   Procedure: COLONOSCOPY WITH PROPOFOL;  Surgeon: Manya Silvas, MD;  Location: [facility] ENDOSCOPY;  Service: Endoscopy;  Laterality: N/A;   ESOPHAGOGASTRODUODENOSCOPY (EGD) WITH PROPOFOL N/A 08/02/2015   Procedure: ESOPHAGOGASTRODUODENOSCOPY (EGD) WITH PROPOFOL;  Surgeon: Manya Silvas, MD;  Location: [facility] ENDOSCOPY;  Service: Endoscopy;  Laterality: N/A;    There were no vitals filed for this visit.   Subjective Assessment - 04/16/21 1319     Subjective  Everytime try to pick up something I drop it - I can make fist - but still numbness - some pain at times     Pertinent History Pt was seen at [facility] on 04/01/21 - for first postop appointment following a endoscopic left carpal tunnel release. Surgery was performed by Dr. Roland Rack on 03/20/2021. Overall the patient feels that she is doing well. She does report a 6 out of 10 pain score in the left wrist and hand but she is only taking Tylenol as needed for discomfort. The patient denies any falls or trauma affecting left wrist or hand since surgery. She denies any signs of infection at home such as fevers or any drainage from the left wrist incision site. The patient has not been doing much lifting pushing or pulling, she does report continued swelling in the left hand and fingers.Refer to OT    Patient Stated Goals Want my L hand swelling and  pain better so I can use it around the house    Currently in Pain? Yes    Pain Score 4     Pain Location Wrist    Pain Orientation Left    Pain Descriptors / Indicators Aching;Tender;Tightness    Pain Type Surgical pain    Pain Onset More than a month ago    Pain Frequency Intermittent                [facility] OT Assessment - 04/16/21 0001       AROM   Left Wrist Extension 60  Degrees    Left Wrist Flexion 90 Degrees    Left Wrist Radial Deviation 25 Degrees    Left Wrist Ulnar Deviation 30 Degrees      Left Hand AROM   L Index  MCP 0-90 75 Degrees    L Index PIP 0-100 95 Degrees    L Long  MCP 0-90 85 Degrees    L Long PIP 0-100 90 Degrees    L Ring  MCP 0-90 90 Degrees    L Ring PIP 0-100 90 Degrees    L Little  MCP 0-90 90 Degrees    L Little PIP 0-100 90 Degrees                      OT Treatments/Exercises (OP) - 04/16/21 0001       LUE Contrast Bath   Time 8 minutes    Comments prior to scar massage and ROM             Review with pt - for aid and sister to help with scar massage Cont with isotoner glove- edema improve - cont with contrast   Soft tissue massage -MC and CT spreads as well as webspace Scar massage by OT  and pt ed on -hand out provided - done extractor few times and mini massager -scar tender to massage and different texturs  AAROM for wrist flexion, ext Isometric for RD, UD of wrist  And tendon glides  PA and RA AROM of thumb  Opposition to all digits and sliding down 5th  10reps  2 x day  Desensitization -massage , soft textures and rougher textures - scar mobs 3  x day        OT Education - 04/16/21 1332     Education Details progress and changes to HEP    Person(s) Educated Patient    Methods Explanation;Demonstration;Tactile cues;Verbal cues;Handout    Comprehension Verbal cues required;Returned demonstration;Verbalized understanding              OT Short Term Goals - 04/09/21 1722       OT SHORT TERM GOAL #1   Title Pain/numbness  on L wrist/ hand  decrease to less than 2/10 with use during day    Baseline 6/10 pain with use and AROM - numbness still in digits constant per pt    Time 4    Period Weeks    Status New    Target Date 05/07/21      OT SHORT TERM GOAL #2   Title Pt to be ind in HEP to decrease pain , increase AROM in wrist and digits to [facility] to use hand in eating , bathing    Baseline Pain increase to 6/10 at wrist , thumb and digits AROM - tender did now allow OT to touch hand or wrist until after contrast and review of HEP    Time 3    Status New    Target Date 04/30/21               OT Long Term Goals - 04/09/21 1725       OT LONG TERM GOAL #1   Title Pt L grip  strength increase to more than 50% compare to R hand to use L hand  in bathing , turn doorknob without increase symptoms    Baseline 3 wks s/p . pain and tenderness over scar and hand - 6/10 oain with AROM    Time 8  Period Weeks    Status New    Target Date 06/04/21      OT LONG TERM GOAL #2   Title Strength and AROM increase in L hand and wrist for pt to pull and push door, carry more than 5 lbs , cut food and hold dring without increase symptoms    Baseline 3 wks s/p -  pain 6/10 in wrist and thumb - stender over scar    Time 8    Period Weeks    Status New    Target Date 06/04/21                   Plan - 04/16/21 1333     Clinical Impression Statement Pt present at OT tomorrow 4 wks s/p arthroscopic CTR on the L by Dr Roland Rack on 03/20/21 - pt report numbness in fingers still, pain 2-4/10  at rest and stretching 6/10 at wrist , digits and thumb. Edema decreasing but still tender over scar - scar adhere and tight. She did soft tissue mobs but not scar massage. Done in clinic today and pt ed on doing at home - aid and sister can help. Digits and wrist AROM increased but still limited in MC's and wrist ext.  Decrease AROM in all digits and wrist with decrease strength limiting her functional use of L hand in ADL's and IADl's Pt can benefit from skilled OT services    OT Occupational Profile and History Problem Focused Assessment - Including review of records relating to presenting problem    Occupational performance deficits (Please refer to evaluation for details): ADL's;IADL's;Play;Leisure;Social Participation    Body Structure / Function / Physical Skills ADL;Strength;Decreased knowledge of use of DME;Decreased knowledge of precautions;Flexibility;ROM;IADL;Edema;UE functional use;Pain;Scar mobility    Rehab Potential Good    Clinical Decision Making Limited treatment options, no task modification necessary    Comorbidities Affecting Occupational Performance: None    Modification or Assistance to Complete Evaluation  No modification of tasks or assist necessary to complete eval    OT Frequency 2x / week    OT Duration 8 weeks    OT Treatment/Interventions Self-care/ADL training;Ultrasound;Contrast Bath;Therapeutic exercise;Manual Therapy;Patient/family education;Passive range of motion;DME and/or AE instruction;Splinting;Fluidtherapy;Paraffin;Scar mobilization    Consulted and Agree with Plan of Care Patient             Patient will benefit from  skilled therapeutic intervention in order to improve the following deficits and impairments:   Body Structure / Function / Physical Skills: ADL, Strength, Decreased knowledge of use of DME, Decreased knowledge of precautions, Flexibility, ROM, IADL, Edema, UE functional use, Pain, Scar mobility       Visit Diagnosis: Pain in left hand  Muscle weakness (generalized)  Scar tissue  Pain in left wrist  Carpal tunnel syndrome, left  Numbness and tingling in left hand    Problem List Patient Active Problem List   Diagnosis Date Noted   Lymphedema 01/17/2016   Venous (peripheral) insufficiency 01/17/2016   Pain in limb 01/17/2016   Iron deficiency anemia due to chronic blood loss 01/09/2016   History of endometrial cancer 08/01/2015   Endometrial adenocarcinoma ([place]) 07/13/2015    Class: History of   Inguinal lymphadenopathy 07/13/2015   Long term current use of insulin ([place]) 12/12/2013   Microalbuminuria 12/12/2013   Diabetes ([place]) 07/28/2013   BP (high blood pressure) 07/28/2013   HLD (hyperlipidemia) 07/28/2013   Adiposity 07/28/2013   Obstructive apnea 07/28/2013   Arthritis, degenerative 07/28/2013   Apnea,  sleep 07/28/2013   Thyroid nodule 07/28/2013    Rosalyn Gess, OTR/L,CLT 04/16/2021, 2:01 PM  [facility] [address] [place], [address] Phone: [phone]   Fax:  [phone]  Name: Christine Lawson MRN: [id number] Date of Birth: [birth year]

## 2021-04-18 ENCOUNTER — Other Ambulatory Visit: Payer: Self-pay

## 2021-04-18 ENCOUNTER — Ambulatory Visit: Payer: Medicare Other | Admitting: Occupational Therapy

## 2021-04-18 DIAGNOSIS — M6281 Muscle weakness (generalized): Secondary | ICD-10-CM

## 2021-04-18 DIAGNOSIS — M79642 Pain in left hand: Secondary | ICD-10-CM | POA: Diagnosis not present

## 2021-04-18 DIAGNOSIS — L905 Scar conditions and fibrosis of skin: Secondary | ICD-10-CM

## 2021-04-18 DIAGNOSIS — M25532 Pain in left wrist: Secondary | ICD-10-CM

## 2021-04-18 DIAGNOSIS — G5602 Carpal tunnel syndrome, left upper limb: Secondary | ICD-10-CM

## 2021-04-18 NOTE — Therapy (Signed)
[facility] [address] [place], [address] Phone: [phone]   Fax:  [phone]  Occupational Therapy Treatment  Patient Details  Name: Christine Lawson MRN: [id number] Date of Birth: May 30, 1960 Referring Provider (OT): DR Poggi   Encounter Date: 04/18/2021   OT End of Session - 04/18/21 1340     Visit Number 3    Number of Visits 14    Date for OT Re-Evaluation 06/04/21    OT Start Time 1330    OT Stop Time 1409    OT Time Calculation (min) 39 min    Activity Tolerance Patient tolerated treatment well    Behavior During Therapy [facility] for tasks assessed/performed             Past Medical History:  Diagnosis Date   Diabetes mellitus without complication ([place])    Endometrial adenocarcinoma ([place])    GERD (gastroesophageal reflux disease)    Hematochezia    Hyperlipidemia    Hypertension    IDA (iron deficiency anemia)    Inguinal lymphadenopathy 07/13/2015   Irritable bowel syndrome    Obesity    Osteoarthritis    Sleep apnea     Past Surgical History:  Procedure Laterality Date   ABDOMINAL HYSTERECTOMY  feb 2013   CARPAL TUNNEL RELEASE Left 03/20/2021   Procedure: CARPAL TUNNEL RELEASE ENDOSCOPIC;  Surgeon: Corky Mull, MD;  Location: [facility] ORS;  Service: Orthopedics;  Laterality: Left;   COLONOSCOPY WITH PROPOFOL N/A 08/02/2015   Procedure: COLONOSCOPY WITH PROPOFOL;  Surgeon: Manya Silvas, MD;  [facility] ENDOSCOPY;  Service: Endoscopy;  Laterality: N/A;   ESOPHAGOGASTRODUODENOSCOPY (EGD) WITH PROPOFOL N/A 08/02/2015   Procedure: ESOPHAGOGASTRODUODENOSCOPY (EGD) WITH PROPOFOL;  Surgeon: Manya Silvas, MD;  Location: [facility] ENDOSCOPY;  Service: Endoscopy;  Laterality: N/A;    There were no vitals filed for this visit.   Subjective Assessment - 04/18/21 1338     Subjective  My fingers are still numb but not all tof them - mostly thumb thru middle finger- at the scar it is tender  - but did had the aid massage it    Pertinent History Pt was seen at [facility] on 04/01/21 - for first postop appointment following a endoscopic left carpal tunnel release. Surgery was performed by Dr. Roland Rack on 03/20/2021. Overall the patient feels that she is doing well. She does report a 6 out of 10 pain score in the left wrist and hand but she is only taking Tylenol as needed for discomfort. The patient denies any falls or trauma affecting left wrist or hand since surgery. She denies any signs of infection at home such as fevers or any drainage from the left wrist incision site. The patient has not been doing much lifting pushing or pulling, she does report continued swelling in the left hand and fingers.Refer to OT    Patient Stated Goals Want my L hand swelling and  pain better so I can use it around the house    Currently in Pain? Yes    Pain Score 4     Pain Location Wrist    Pain Orientation Left    Pain Descriptors / Indicators Aching;Tightness;Tender    Pain Type Surgical pain    Pain Onset More than a month ago    Pain Frequency Intermittent                increase AROM and strength in L hand and wrist - report some  pain at scar with gripping , lifting and carrying           OT Treatments/Exercises (OP) - 04/18/21 0001       LUE Contrast Bath   Time 8 minutes    Comments prior to scar massage and motion            Review with pt - for aid and sister to help with scar massage Cont with isotoner glove- edema improve - cont with contrast   Soft tissue massage -MC and CT spreads as well as webspace Scar massage by OT and pt ed on again and with sister -hand out provided  last time- done extractor few times with AROM fisting and extention   mini massager  done - and coban use for scar mobs --pt pulling away and tight scar -but better than earlier this week   AAROM for wrist flexion, ext- pt to focus on composite extnetion  I  And tendon glides  PA and RA AROM of  thumb  Opposition to all digits and sliding down 5th  10reps  2 x day  Desensitization -massage , soft textures and rougher textures - scar mobs 3  x day         OT Education - 04/18/21 1340     Education Details progress and changes to HEP    Person(s) Educated Patient;Other (comment)   sister   Methods Explanation;Demonstration;Tactile cues;Verbal cues;Handout    Comprehension Verbal cues required;Returned demonstration;Verbalized understanding              OT Short Term Goals - 04/09/21 1722       OT SHORT TERM GOAL #1   Title Pain/numbness  on L wrist/ hand  decrease to less than 2/10 with use during day    Baseline 6/10 pain with use and AROM - numbness still in digits constant per pt    Time 4    Period Weeks    Status New    Target Date 05/07/21      OT SHORT TERM GOAL #2   Title Pt to be ind in HEP to decrease pain , increase AROM in wrist and digits to [facility] to use hand in eating , bathing    Baseline Pain increase to 6/10 at wrist , thumb and digits AROM - tender did now allow OT to touch hand or wrist until after contrast and review of HEP    Time 3    Status New    Target Date 04/30/21               OT Long Term Goals - 04/09/21 1725       OT LONG TERM GOAL #1   Title Pt L grip  strength increase to more than 50% compare to R hand to use L hand  in bathing , turn doorknob without increase symptoms    Baseline 3 wks s/p . pain and tenderness over scar and hand - 6/10 oain with AROM    Time 8    Period Weeks    Status New    Target Date 06/04/21      OT LONG TERM GOAL #2   Title Strength and AROM increase in L hand and wrist for pt to pull and push door, carry more than 5 lbs , cut food and hold dring without increase symptoms    Baseline 3 wks s/p - pain 6/10 in wrist and thumb - stender over scar    Time 8  Period Weeks    Status New    Target Date 06/04/21                   Plan - 04/18/21 1342     Clinical Impression  Statement Pt present at OT tomorrow 4 1/2  wks s/p arthroscopic CTR on the L by Dr Roland Rack on 03/20/21 - pt report numbness in fingers still present, pain 2-4/10  at rest and stretching 6/10 at wrist and gripping. Edema decreasing but still tender over scar - scar adhere and tight but improving since last visit. Review with pt and sister scar mobs. Initiated this week in clinic  and pt/sister ed on doing at home - aid and sister helping. Digits and wrist AROM increased but still limited in composite flexion of MC's and wrist ext.  Decrease AROM in all digits and wrist with decrease strength and increase pain limiting her functional use of L hand in ADL's and IADl's Pt can benefit from skilled OT services    OT Occupational Profile and History Problem Focused Assessment - Including review of records relating to presenting problem    Occupational performance deficits (Please refer to evaluation for details): ADL's;IADL's;Play;Leisure;Social Participation    Body Structure / Function / Physical Skills ADL;Strength;Decreased knowledge of use of DME;Decreased knowledge of precautions;Flexibility;ROM;IADL;Edema;UE functional use;Pain;Scar mobility    Rehab Potential Good    Clinical Decision Making Limited treatment options, no task modification necessary    Comorbidities Affecting Occupational Performance: None    Modification or Assistance to Complete Evaluation  No modification of tasks or assist necessary to complete eval    OT Frequency 2x / week    OT Duration 8 weeks    OT Treatment/Interventions Self-care/ADL training;Ultrasound;Contrast Bath;Therapeutic exercise;Manual Therapy;Patient/family education;Passive range of motion;DME and/or AE instruction;Splinting;Fluidtherapy;Paraffin;Scar mobilization    Consulted and Agree with Plan of Care Patient             Patient will benefit from skilled therapeutic intervention in order to improve the following deficits and impairments:   Body Structure /  Function / Physical Skills: ADL, Strength, Decreased knowledge of use of DME, Decreased knowledge of precautions, Flexibility, ROM, IADL, Edema, UE functional use, Pain, Scar mobility       Visit Diagnosis: Pain in left hand  Muscle weakness (generalized)  Pain in left wrist  Scar tissue  Carpal tunnel syndrome, left    Problem List Patient Active Problem List   Diagnosis Date Noted   Lymphedema 01/17/2016   Venous (peripheral) insufficiency 01/17/2016   Pain in limb 01/17/2016   Iron deficiency anemia due to chronic blood loss 01/09/2016   History of endometrial cancer 08/01/2015   Endometrial adenocarcinoma ([place]) 07/13/2015    Class: History of   Inguinal lymphadenopathy 07/13/2015   Long term current use of insulin ([place]) 12/12/2013   Microalbuminuria 12/12/2013   Diabetes ([place]) 07/28/2013   BP (high blood pressure) 07/28/2013   HLD (hyperlipidemia) 07/28/2013   Adiposity 07/28/2013   Obstructive apnea 07/28/2013   Arthritis, degenerative 07/28/2013   Apnea, sleep 07/28/2013   Thyroid nodule 07/28/2013    Rosalyn Gess, OTR/L,CLT 04/18/2021, 2:31 PM  [facility] [address] [place], [address] Phone: [phone]   Fax:  [phone]  Name: Charonda Hefter MRN: [id number] Date of Birth: [birth year]

## 2021-04-19 ENCOUNTER — Encounter: Payer: Self-pay | Admitting: Internal Medicine

## 2021-04-23 ENCOUNTER — Ambulatory Visit: Payer: Medicare Other | Admitting: Occupational Therapy

## 2021-04-24 ENCOUNTER — Encounter: Payer: Medicare Other | Admitting: Occupational Therapy

## 2021-04-25 ENCOUNTER — Other Ambulatory Visit: Payer: Self-pay

## 2021-04-25 ENCOUNTER — Ambulatory Visit: Payer: Medicare Other | Admitting: Occupational Therapy

## 2021-04-25 DIAGNOSIS — L905 Scar conditions and fibrosis of skin: Secondary | ICD-10-CM

## 2021-04-25 DIAGNOSIS — M79642 Pain in left hand: Secondary | ICD-10-CM | POA: Diagnosis not present

## 2021-04-25 DIAGNOSIS — M25532 Pain in left wrist: Secondary | ICD-10-CM

## 2021-04-25 DIAGNOSIS — M6281 Muscle weakness (generalized): Secondary | ICD-10-CM

## 2021-04-25 NOTE — Therapy (Signed)
[facility] [address] [place], [address] Phone: [phone]   Fax:  [phone]  Occupational Therapy Treatment  Patient Details  Name: Christine Lawson MRN: [id number] Date of Birth: June 25, 1960 Referring Provider (OT): DR Poggi   Encounter Date: 04/25/2021   OT End of Session - 04/25/21 1355     Visit Number 4    Number of Visits 14    Date for OT Re-Evaluation 06/04/21    OT Start Time 1310    OT Stop Time 9735    OT Time Calculation (min) 44 min    Activity Tolerance Patient tolerated treatment well    Behavior During Therapy [facility] for tasks assessed/performed             Past Medical History:  Diagnosis Date   Diabetes mellitus without complication ([place])    Endometrial adenocarcinoma ([place])    GERD (gastroesophageal reflux disease)    Hematochezia    Hyperlipidemia    Hypertension    IDA (iron deficiency anemia)    Inguinal lymphadenopathy 07/13/2015   Irritable bowel syndrome    Obesity    Osteoarthritis    Sleep apnea     Past Surgical History:  Procedure Laterality Date   ABDOMINAL HYSTERECTOMY  feb 2013   CARPAL TUNNEL RELEASE Left 03/20/2021   Procedure: CARPAL TUNNEL RELEASE ENDOSCOPIC;  Surgeon: Corky Mull, MD;  Location: [facility] ORS;  Service: Orthopedics;  Laterality: Left;   COLONOSCOPY WITH PROPOFOL N/A 08/02/2015   Procedure: COLONOSCOPY WITH PROPOFOL;  Surgeon: Manya Silvas, MD;  Location: [facility] ENDOSCOPY;  Service: Endoscopy;  Laterality: N/A;   ESOPHAGOGASTRODUODENOSCOPY (EGD) WITH PROPOFOL N/A 08/02/2015   Procedure: ESOPHAGOGASTRODUODENOSCOPY (EGD) WITH PROPOFOL;  Surgeon: Manya Silvas, MD;  Location: [facility] ENDOSCOPY;  Service: Endoscopy;  Laterality: N/A;    There were no vitals filed for this visit.   Subjective Assessment - 04/25/21 1314     Subjective  Doing better- still little numb thumb thru middle finger and cannot grip things tight yet - strenght not  there yet    Pertinent History Pt was seen at [facility] on 04/01/21 - for first postop appointment following a endoscopic left carpal tunnel release. Surgery was performed by Dr. Roland Rack on 03/20/2021. Overall the patient feels that she is doing well. She does report a 6 out of 10 pain score in the left wrist and hand but she is only taking Tylenol as needed for discomfort. The patient denies any falls or trauma affecting left wrist or hand since surgery. She denies any signs of infection at home such as fevers or any drainage from the left wrist incision site. The patient has not been doing much lifting pushing or pulling, she does report continued swelling in the left hand and fingers.Refer to OT    Patient Stated Goals Want my L hand swelling and  pain better so I can use it around the house    Currently in Pain? No/denies                [facility] OT Assessment - 04/25/21 0001       AROM   Left Wrist Extension 60 Degrees    Left Wrist Flexion 90 Degrees    Left Wrist Radial Deviation 25 Degrees    Left Wrist Ulnar Deviation 30 Degrees      Left Hand AROM   L Thumb Opposition to Index --   Opposition to 5th and sliding down  L Index  MCP 0-90 80 Degrees    L Index PIP 0-100 90 Degrees    L Long  MCP 0-90 80 Degrees    L Long PIP 0-100 90 Degrees    L Ring  MCP 0-90 90 Degrees    L Ring PIP 0-100 90 Degrees    L Little  MCP 0-90 90 Degrees    L Little PIP 0-100 90 Degrees                      OT Treatments/Exercises (OP) - 04/25/21 0001       LUE Contrast Bath   Time 8 minutes    Comments prior to scar tissue and ROM             Review with pt - for aid and sister to help with scar massage Cont with isotoner glove- night time - edema improve - cont with contrast   Soft tissue massage -MC and CT spreads as well as webspace Scar massage by OT and pt ed on again and with sister -hand out provided  at eval - done extractor few times with AROM fisting and extention    mini massager  done - and coban use for scar mobs --pt still pulling away -but better distal and proximal to scar   AAROM for wrist flexion, ext- pt to focus on composite extnetion  review prayer stretch with pt  I  And tendon glides  PA and RA AROM of thumb  Opposition to all digits and sliding down 5th  10reps  Add pink foam block for gripping , lat and 3 point pinch - add to HEP 20 reps  2 x day  Scar massage now mostly -doing well with desensitization        OT Education - 04/25/21 1355     Education Details progress and changes to HEP    Person(s) Educated Patient;Other (comment)    Methods Explanation;Demonstration;Tactile cues;Verbal cues;Handout    Comprehension Verbal cues required;Returned demonstration;Verbalized understanding              OT Short Term Goals - 04/09/21 1722       OT SHORT TERM GOAL #1   Title Pain/numbness  on L wrist/ hand  decrease to less than 2/10 with use during day    Baseline 6/10 pain with use and AROM - numbness still in digits constant per pt    Time 4    Period Weeks    Status New    Target Date 05/07/21      OT SHORT TERM GOAL #2   Title Pt to be ind in HEP to decrease pain , increase AROM in wrist and digits to [facility] to use hand in eating , bathing    Baseline Pain increase to 6/10 at wrist , thumb and digits AROM - tender did now allow OT to touch hand or wrist until after contrast and review of HEP    Time 3    Status New    Target Date 04/30/21               OT Long Term Goals - 04/09/21 1725       OT LONG TERM GOAL #1   Title Pt L grip  strength increase to more than 50% compare to R hand to use L hand  in bathing , turn doorknob without increase symptoms    Baseline 3 wks s/p . pain and tenderness over scar  and hand - 6/10 oain with AROM    Time 8    Period Weeks    Status New    Target Date 06/04/21      OT LONG TERM GOAL #2   Title Strength and AROM increase in L hand and wrist for pt to pull and push  door, carry more than 5 lbs , cut food and hold dring without increase symptoms    Baseline 3 wks s/p - pain 6/10 in wrist and thumb - stender over scar    Time 8    Period Weeks    Status New    Target Date 06/04/21                   Plan - 04/25/21 1356     Clinical Impression Statement Pt present at OT tomorrow 5 wks s/p arthroscopic CTR on the L by Dr Roland Rack on 03/20/21 - pt report numbness in fingers still present and pain mostly with scar massage but pain better with stretching of wrist extention -grip decrease but not painfull . Edema decreasing but still tender over scar - scar adhere and tight but improving.. Review with pt and sister scar mobs again . Pt/sister ed on doing at home - aid and sister helping pt. Digits and wrist AROM increased but still limited in composite flexion of MC's and wrist ext. Initiate light gripping and pinching in foam block this date - pain free. Cont to have decrease composite fist , wrist ext and strength and increase pain  over scar limiting her functional use of L hand in ADL's and IADl's Pt can benefit from skilled OT services    OT Occupational Profile and History Problem Focused Assessment - Including review of records relating to presenting problem    Occupational performance deficits (Please refer to evaluation for details): ADL's;IADL's;Play;Leisure;Social Participation    Body Structure / Function / Physical Skills ADL;Strength;Decreased knowledge of use of DME;Decreased knowledge of precautions;Flexibility;ROM;IADL;Edema;UE functional use;Pain;Scar mobility    Rehab Potential Good    Clinical Decision Making Limited treatment options, no task modification necessary    Comorbidities Affecting Occupational Performance: None    Modification or Assistance to Complete Evaluation  No modification of tasks or assist necessary to complete eval    OT Frequency 2x / week    OT Duration 6 weeks    OT Treatment/Interventions Self-care/ADL  training;Ultrasound;Contrast Bath;Therapeutic exercise;Manual Therapy;Patient/family education;Passive range of motion;DME and/or AE instruction;Splinting;Fluidtherapy;Paraffin;Scar mobilization    Consulted and Agree with Plan of Care Patient             Patient will benefit from skilled therapeutic intervention in order to improve the following deficits and impairments:   Body Structure / Function / Physical Skills: ADL, Strength, Decreased knowledge of use of DME, Decreased knowledge of precautions, Flexibility, ROM, IADL, Edema, UE functional use, Pain, Scar mobility       Visit Diagnosis: Pain in left hand  Muscle weakness (generalized)  Pain in left wrist  Scar tissue    Problem List Patient Active Problem List   Diagnosis Date Noted   Lymphedema 01/17/2016   Venous (peripheral) insufficiency 01/17/2016   Pain in limb 01/17/2016   Iron deficiency anemia due to chronic blood loss 01/09/2016   History of endometrial cancer 08/01/2015   Endometrial adenocarcinoma ([place]) 07/13/2015    Class: History of   Inguinal lymphadenopathy 07/13/2015   Long term current use of insulin ([place]) 12/12/2013   Microalbuminuria 12/12/2013   Diabetes ([place]) 07/28/2013  BP (high blood pressure) 07/28/2013   HLD (hyperlipidemia) 07/28/2013   Adiposity 07/28/2013   Obstructive apnea 07/28/2013   Arthritis, degenerative 07/28/2013   Apnea, sleep 07/28/2013   Thyroid nodule 07/28/2013    Rosalyn Gess, OTR/L,CLT 04/25/2021, 1:59 PM  [facility] [address], [place], [address] Phone: [phone]   Fax:  [phone]  Name: Donnie Gedeon MRN: [id number] Date of Birth: 05/30/1960

## 2021-04-30 ENCOUNTER — Ambulatory Visit: Payer: Medicare Other | Admitting: Occupational Therapy

## 2021-04-30 DIAGNOSIS — M79642 Pain in left hand: Secondary | ICD-10-CM

## 2021-04-30 DIAGNOSIS — L905 Scar conditions and fibrosis of skin: Secondary | ICD-10-CM

## 2021-04-30 DIAGNOSIS — M25532 Pain in left wrist: Secondary | ICD-10-CM

## 2021-04-30 DIAGNOSIS — M6281 Muscle weakness (generalized): Secondary | ICD-10-CM

## 2021-04-30 NOTE — Therapy (Signed)
[facility] [address] [place], [address] Phone: [phone]   Fax:  [phone]  Occupational Therapy Treatment  Patient Details  Name: Christine Lawson MRN: [id number] Date of Birth: Aug 10, 1960 Referring Provider (OT): DR Poggi   Encounter Date: 04/30/2021   OT End of Session - 04/30/21 1422     Visit Number 5    Number of Visits 14    Date for OT Re-Evaluation 06/04/21    OT Start Time 1400    OT Stop Time 1443    OT Time Calculation (min) 43 min    Activity Tolerance Patient tolerated treatment well    Behavior During Therapy [facility] for tasks assessed/performed             Past Medical History:  Diagnosis Date   Diabetes mellitus without complication ([place])    Endometrial adenocarcinoma ([place])    GERD (gastroesophageal reflux disease)    Hematochezia    Hyperlipidemia    Hypertension    IDA (iron deficiency anemia)    Inguinal lymphadenopathy 07/13/2015   Irritable bowel syndrome    Obesity    Osteoarthritis    Sleep apnea     Past Surgical History:  Procedure Laterality Date   ABDOMINAL HYSTERECTOMY  feb 2013   CARPAL TUNNEL RELEASE Left 03/20/2021   Procedure: CARPAL TUNNEL RELEASE ENDOSCOPIC;  Surgeon: Corky Mull, MD;  Location: [facility] ORS;  Service: Orthopedics;  Laterality: Left;   COLONOSCOPY WITH PROPOFOL N/A 08/02/2015   Procedure: COLONOSCOPY WITH PROPOFOL;  Surgeon: Manya Silvas, MD;  Location: [facility] ENDOSCOPY;  Service: Endoscopy;  Laterality: N/A;   ESOPHAGOGASTRODUODENOSCOPY (EGD) WITH PROPOFOL N/A 08/02/2015   Procedure: ESOPHAGOGASTRODUODENOSCOPY (EGD) WITH PROPOFOL;  Surgeon: Manya Silvas, MD;  Location: [facility] ENDOSCOPY;  Service: Endoscopy;  Laterality: N/A;    There were no vitals filed for this visit.   Subjective Assessment - 04/30/21 1420     Subjective  My aid did my scar massage and doing okay - with bathing use my hand and pulling up pants -but cannot cut  meat    Pertinent History Pt was seen at [facility] on 04/01/21 - for first postop appointment following a endoscopic left carpal tunnel release. Surgery was performed by Dr. Roland Rack on 03/20/2021. Overall the patient feels that she is doing well. She does report a 6 out of 10 pain score in the left wrist and hand but she is only taking Tylenol as needed for discomfort. The patient denies any falls or trauma affecting left wrist or hand since surgery. She denies any signs of infection at home such as fevers or any drainage from the left wrist incision site. The patient has not been doing much lifting pushing or pulling, she does report continued swelling in the left hand and fingers.Refer to OT    Patient Stated Goals Want my L hand swelling and  pain better so I can use it around the house    Currently in Pain? Yes    Pain Score 4     Pain Location Hand    Pain Orientation Left    Pain Descriptors / Indicators Aching;Tightness;Tender    Pain Type Surgical pain    Pain Onset More than a month ago    Aggravating Factors  gripping                [facility] OT Assessment - 04/30/21 0001       Strength   Right Hand Grip (  lbs) 41    Right Hand Lateral Pinch 14 lbs    Right Hand 3 Point Pinch 11 lbs    Left Hand Grip (lbs) 21    Left Hand Lateral Pinch 12 lbs    Left Hand 3 Point Pinch 11 lbs                      OT Treatments/Exercises (OP) - 04/30/21 0001       LUE Paraffin   Number Minutes Paraffin 8 Minutes    LUE Paraffin Location Hand;Wrist    Comments prior to scar tissue , ext stretch and putty            Review with pt  change scar massage over cica scar pad by sister and pt  Cont with isotoner glove- with cica scar pad night time -  edema improve - cont with contrast   Soft tissue massage -MC and CT spreads as well as webspace Scar massage by OT  mini massager  done - and coban use for scar mobs --tolerating massage better  AAROM for wrist flexion, ext- pt to focus  on composite extnetion and add table slides this date - 20 reps add to HEP  I  And tendon glides  PA and RA AROM of thumb  Opposition to all digits and sliding down 5th  10reps  2 x day  Add light blue putty for gripping - 15 reps - no pain  To do 2 x day         OT Education - 04/30/21 1422     Education Details progress and changes to HEP    Person(s) Educated Patient;Other (comment)    Methods Explanation;Demonstration;Tactile cues;Verbal cues;Handout    Comprehension Verbal cues required;Returned demonstration;Verbalized understanding              OT Short Term Goals - 04/09/21 1722       OT SHORT TERM GOAL #1   Title Pain/numbness  on L wrist/ hand  decrease to less than 2/10 with use during day    Baseline 6/10 pain with use and AROM - numbness still in digits constant per pt    Time 4    Period Weeks    Status New    Target Date 05/07/21      OT SHORT TERM GOAL #2   Title Pt to be ind in HEP to decrease pain , increase AROM in wrist and digits to [facility] to use hand in eating , bathing    Baseline Pain increase to 6/10 at wrist , thumb and digits AROM - tender did now allow OT to touch hand or wrist until after contrast and review of HEP    Time 3    Status New    Target Date 04/30/21               OT Long Term Goals - 04/09/21 1725       OT LONG TERM GOAL #1   Title Pt L grip  strength increase to more than 50% compare to R hand to use L hand  in bathing , turn doorknob without increase symptoms    Baseline 3 wks s/p . pain and tenderness over scar and hand - 6/10 oain with AROM    Time 8    Period Weeks    Status New    Target Date 06/04/21      OT LONG TERM GOAL #2   Title Strength and  AROM increase in L hand and wrist for pt to pull and push door, carry more than 5 lbs , cut food and hold dring without increase symptoms    Baseline 3 wks s/p - pain 6/10 in wrist and thumb - stender over scar    Time 8    Period Weeks    Status New    Target  Date 06/04/21                   Plan - 04/30/21 1423     Clinical Impression Statement Pt present at OT tomorrow 6 wks s/p arthroscopic CTR on the L by Dr Roland Rack on 03/20/21 - pt report numbness in fingers still present  and pain mostly with scar massage but able to use her hand little better than she could before surgery. Done scar massage today over cica scar pad , and assess grip and prehension - L grip decrease but prehension WFL.  Edema decreasing but still tender over scar - scar adhere and tight but improving add wrist extention stretch today for table slides painfree.  Add light putty for gripping this date. Cont to have  increase scar tissue limiting her wrist ext and  grip strength and increase pain  over scar limiting her functional use of L hand in ADL's and IADl's Pt can benefit from skilled OT services    OT Occupational Profile and History Problem Focused Assessment - Including review of records relating to presenting problem    Occupational performance deficits (Please refer to evaluation for details): ADL's;IADL's;Play;Leisure;Social Participation    Body Structure / Function / Physical Skills ADL;Strength;Decreased knowledge of use of DME;Decreased knowledge of precautions;Flexibility;ROM;IADL;Edema;UE functional use;Pain;Scar mobility    Rehab Potential Good    Clinical Decision Making Limited treatment options, no task modification necessary    Comorbidities Affecting Occupational Performance: None    Modification or Assistance to Complete Evaluation  No modification of tasks or assist necessary to complete eval    OT Frequency 2x / week    OT Duration 6 weeks    OT Treatment/Interventions Self-care/ADL training;Ultrasound;Contrast Bath;Therapeutic exercise;Manual Therapy;Patient/family education;Passive range of motion;DME and/or AE instruction;Splinting;Fluidtherapy;Paraffin;Scar mobilization    Consulted and Agree with Plan of Care Patient             Patient  will benefit from skilled therapeutic intervention in order to improve the following deficits and impairments:   Body Structure / Function / Physical Skills: ADL, Strength, Decreased knowledge of use of DME, Decreased knowledge of precautions, Flexibility, ROM, IADL, Edema, UE functional use, Pain, Scar mobility       Visit Diagnosis: Pain in left hand  Muscle weakness (generalized)  Pain in left wrist  Scar tissue    Problem List Patient Active Problem List   Diagnosis Date Noted   Lymphedema 01/17/2016   Venous (peripheral) insufficiency 01/17/2016   Pain in limb 01/17/2016   Iron deficiency anemia due to chronic blood loss 01/09/2016   History of endometrial cancer 08/01/2015   Endometrial adenocarcinoma ([place]) 07/13/2015    Class: History of   Inguinal lymphadenopathy 07/13/2015   Long term current use of insulin ([place]) 12/12/2013   Microalbuminuria 12/12/2013   Diabetes ([place]) 07/28/2013   BP (high blood pressure) 07/28/2013   HLD (hyperlipidemia) 07/28/2013   Adiposity 07/28/2013   Obstructive apnea 07/28/2013   Arthritis, degenerative 07/28/2013   Apnea, sleep 07/28/2013   Thyroid nodule 07/28/2013    Rosalyn Gess, OTR/L,CLT 04/30/2021, 2:52 PM  [facility] [address], [place], [address] Phone: [phone]   Fax:  [phone]  Name: Christine Lawson MRN: [id number] Date of Birth: 1960-04-12

## 2021-05-02 ENCOUNTER — Ambulatory Visit: Payer: Medicare Other | Attending: Student | Admitting: Occupational Therapy

## 2021-05-02 ENCOUNTER — Other Ambulatory Visit: Payer: Self-pay

## 2021-05-02 DIAGNOSIS — M79642 Pain in left hand: Secondary | ICD-10-CM | POA: Insufficient documentation

## 2021-05-02 DIAGNOSIS — L905 Scar conditions and fibrosis of skin: Secondary | ICD-10-CM | POA: Insufficient documentation

## 2021-05-02 DIAGNOSIS — M6281 Muscle weakness (generalized): Secondary | ICD-10-CM | POA: Diagnosis present

## 2021-05-02 DIAGNOSIS — M25532 Pain in left wrist: Secondary | ICD-10-CM | POA: Insufficient documentation

## 2021-05-02 NOTE — Therapy (Signed)
[facility] [address], [place], [address] Phone: [phone]   Fax:  [phone]  Occupational Therapy Treatment  Patient Details  Name: Christine Lawson MRN: [id number] Date of Birth: 1960-08-18 Referring Provider (OT): DR Poggi   Encounter Date: 05/02/2021   OT End of Session - 05/02/21 1402     Visit Number 6    Number of Visits 14    Date for OT Re-Evaluation 06/04/21    OT Start Time 3570    OT Stop Time 1440    OT Time Calculation (min) 38 min    Activity Tolerance Patient tolerated treatment well    Behavior During Therapy [facility] for tasks assessed/performed             Past Medical History:  Diagnosis Date   Diabetes mellitus without complication ([place])    Endometrial adenocarcinoma ([place])    GERD (gastroesophageal reflux disease)    Hematochezia    Hyperlipidemia    Hypertension    IDA (iron deficiency anemia)    Inguinal lymphadenopathy 07/13/2015   Irritable bowel syndrome    Obesity    Osteoarthritis    Sleep apnea     Past Surgical History:  Procedure Laterality Date   ABDOMINAL HYSTERECTOMY  feb 2013   CARPAL TUNNEL RELEASE Left 03/20/2021   Procedure: CARPAL TUNNEL RELEASE ENDOSCOPIC;  Surgeon: Corky Mull, MD;  Location: [facility] ORS;  Service: Orthopedics;  Laterality: Left;   COLONOSCOPY WITH PROPOFOL N/A 08/02/2015   Procedure: COLONOSCOPY WITH PROPOFOL;  Surgeon: Manya Silvas, MD;  Location: [facility] ENDOSCOPY;  Service: Endoscopy;  Laterality: N/A;   ESOPHAGOGASTRODUODENOSCOPY (EGD) WITH PROPOFOL N/A 08/02/2015   Procedure: ESOPHAGOGASTRODUODENOSCOPY (EGD) WITH PROPOFOL;  Surgeon: Manya Silvas, MD;  Location: [facility] ENDOSCOPY;  Service: Endoscopy;  Laterality: N/A;    There were no vitals filed for this visit.   Subjective Assessment - 05/02/21 1401     Subjective  Did not do putty yet -because they did not my scar massage - but doing better - using my hand more during  bathing and dressing -My aid at home said my hand looks better    Pertinent History Pt was seen at [facility] on 04/01/21 - for first postop appointment following a endoscopic left carpal tunnel release. Surgery was performed by Dr. Roland Rack on 03/20/2021. Overall the patient feels that she is doing well. She does report a 6 out of 10 pain score in the left wrist and hand but she is only taking Tylenol as needed for discomfort. The patient denies any falls or trauma affecting left wrist or hand since surgery. She denies any signs of infection at home such as fevers or any drainage from the left wrist incision site. The patient has not been doing much lifting pushing or pulling, she does report continued swelling in the left hand and fingers.Refer to OT    Patient Stated Goals Want my L hand swelling and  pain better so I can use it around the house    Currently in Pain? Yes    Pain Score 4     Pain Location Wrist    Pain Orientation Left    Pain Descriptors / Indicators Aching;Tightness    Pain Onset More than a month ago    Pain Frequency Intermittent                [facility] OT Assessment - 05/02/21 0001       AROM  Left Wrist Extension 60 Degrees    Left Wrist Flexion 90 Degrees    Left Wrist Radial Deviation 25 Degrees    Left Wrist Ulnar Deviation 30 Degrees      Strength   Right Hand Grip (lbs) 41    Right Hand Lateral Pinch 14 lbs    Right Hand 3 Point Pinch 11 lbs    Left Hand Grip (lbs) 28    Left Hand Lateral Pinch 12 lbs    Left Hand 3 Point Pinch 11 lbs                      OT Treatments/Exercises (OP) - 05/02/21 0001       LUE Paraffin   Number Minutes Paraffin 8 Minutes    LUE Paraffin Location Hand;Wrist    Comments decrease pain and prior to scar tissue               Review with pt /sister scar massage over cica scar pad by sister and aid if pt cannot tolerate it Cont with isotoner glove- with cica scar pad night time -  edema improved - cont with  contrast or heat prior to HEP  Soft tissue massage -MC and CT spreads as well as webspace Scar massage by OT  mini massager  done  - pt still pulling her hand away -   AAROM for wrist flexion, ext- pt to focus on composite extnetion and add table slides this week - 20 reps add to HEP    And tendon glides  PA and RA AROM of thumb  Opposition to all digits and sliding down 5th  WNL  10reps  2 x day   light blue putty for gripping - 15 reps x 2 - no pain  To do 2 x day          OT Education - 05/02/21 1401     Education Details progress and changes to HEP    Person(s) Educated Patient;Other (comment)    Methods Explanation;Demonstration;Tactile cues;Verbal cues;Handout    Comprehension Verbal cues required;Returned demonstration;Verbalized understanding              OT Short Term Goals - 04/09/21 1722       OT SHORT TERM GOAL #1   Title Pain/numbness  on L wrist/ hand  decrease to less than 2/10 with use during day    Baseline 6/10 pain with use and AROM - numbness still in digits constant per pt    Time 4    Period Weeks    Status New    Target Date 05/07/21      OT SHORT TERM GOAL #2   Title Pt to be ind in HEP to decrease pain , increase AROM in wrist and digits to [facility] to use hand in eating , bathing    Baseline Pain increase to 6/10 at wrist , thumb and digits AROM - tender did now allow OT to touch hand or wrist until after contrast and review of HEP    Time 3    Status New    Target Date 04/30/21               OT Long Term Goals - 04/09/21 1725       OT LONG TERM GOAL #1   Title Pt L grip  strength increase to more than 50% compare to R hand to use L hand  in bathing , turn doorknob without increase symptoms  Baseline 3 wks s/p . pain and tenderness over scar and hand - 6/10 oain with AROM    Time 8    Period Weeks    Status New    Target Date 06/04/21      OT LONG TERM GOAL #2   Title Strength and AROM increase in L hand and wrist for pt to  pull and push door, carry more than 5 lbs , cut food and hold dring without increase symptoms    Baseline 3 wks s/p - pain 6/10 in wrist and thumb - stender over scar    Time 8    Period Weeks    Status New    Target Date 06/04/21                   Plan - 05/02/21 1411     Clinical Impression Statement Pt present at OT about 6  wks s/p L arthroscopic CTR  by Dr Roland Rack on 03/20/21 - pt report numbness in fingers still present  and pain mostly with scar massage and over scar- still pulling her hand away - aid and sister doing mostly her scar massage. Doing her scar massage today over cica scar pad  and tolerating better. Grip strength increase and prehension better than it was prior to surgery. Scar adhere and tight but improving add wrist extention stretch  and table slides painfree. Pt also to do putty for gripping. Cont to have  increase scar tissue limiting her wrist ext and  grip strength and increase pain  over scar limiting her functional use of L hand in ADL's and IADl's Pt can benefit from skilled OT services    OT Occupational Profile and History Problem Focused Assessment - Including review of records relating to presenting problem    Occupational performance deficits (Please refer to evaluation for details): ADL's;IADL's;Play;Leisure;Social Participation    Body Structure / Function / Physical Skills ADL;Strength;Decreased knowledge of use of DME;Decreased knowledge of precautions;Flexibility;ROM;IADL;Edema;UE functional use;Pain;Scar mobility    Rehab Potential Good    Clinical Decision Making Limited treatment options, no task modification necessary    Comorbidities Affecting Occupational Performance: None    Modification or Assistance to Complete Evaluation  No modification of tasks or assist necessary to complete eval    OT Frequency 2x / week    OT Duration 6 weeks    OT Treatment/Interventions Self-care/ADL training;Ultrasound;Contrast Bath;Therapeutic exercise;Manual  Therapy;Patient/family education;Passive range of motion;DME and/or AE instruction;Splinting;Fluidtherapy;Paraffin;Scar mobilization    Consulted and Agree with Plan of Care Patient             Patient will benefit from skilled therapeutic intervention in order to improve the following deficits and impairments:   Body Structure / Function / Physical Skills: ADL, Strength, Decreased knowledge of use of DME, Decreased knowledge of precautions, Flexibility, ROM, IADL, Edema, UE functional use, Pain, Scar mobility       Visit Diagnosis: Pain in left hand  Muscle weakness (generalized)  Pain in left wrist  Scar tissue    Problem List Patient Active Problem List   Diagnosis Date Noted   Lymphedema 01/17/2016   Venous (peripheral) insufficiency 01/17/2016   Pain in limb 01/17/2016   Iron deficiency anemia due to chronic blood loss 01/09/2016   History of endometrial cancer 08/01/2015   Endometrial adenocarcinoma ([place]) 07/13/2015    Class: History of   Inguinal lymphadenopathy 07/13/2015   Long term current use of insulin ([place]) 12/12/2013   Microalbuminuria 12/12/2013   Diabetes ([place])  07/28/2013   BP (high blood pressure) 07/28/2013   HLD (hyperlipidemia) 07/28/2013   Adiposity 07/28/2013   Obstructive apnea 07/28/2013   Arthritis, degenerative 07/28/2013   Apnea, sleep 07/28/2013   Thyroid nodule 07/28/2013    Rosalyn Gess, OTR/L,CLT 05/02/2021, 5:50 PM  [facility] [address] [place], [address] Phone: [phone]   Fax:  [phone]  Name: Christine Lawson MRN: [id number] Date of Birth: 02/23/1961

## 2021-05-07 ENCOUNTER — Encounter: Payer: Medicare Other | Admitting: Occupational Therapy

## 2021-05-09 ENCOUNTER — Ambulatory Visit: Payer: Medicare Other | Admitting: Occupational Therapy

## 2021-05-14 ENCOUNTER — Other Ambulatory Visit: Payer: Self-pay

## 2021-05-14 ENCOUNTER — Ambulatory Visit: Payer: Medicare Other | Admitting: Occupational Therapy

## 2021-05-14 DIAGNOSIS — L905 Scar conditions and fibrosis of skin: Secondary | ICD-10-CM

## 2021-05-14 DIAGNOSIS — M25532 Pain in left wrist: Secondary | ICD-10-CM

## 2021-05-14 DIAGNOSIS — M6281 Muscle weakness (generalized): Secondary | ICD-10-CM

## 2021-05-14 DIAGNOSIS — M79642 Pain in left hand: Secondary | ICD-10-CM | POA: Diagnosis not present

## 2021-05-14 NOTE — Therapy (Signed)
[place] [facility] ?[address] ?[place], [place], [address] ?Phone: [phone]   Fax:  [phone] ? ?Occupational Therapy Treatment ? ?Patient Details  ?Name: Christine Lawson ?MRN: [id number] ?Date of Birth: Sep 03, 1960 ?Referring Provider (OT): DR Poggi ? ? ?Encounter Date: 05/14/2021 ? ? OT End of Session - 05/14/21 1441   ? ? Visit Number 7   ? Number of Visits 14   ? Date for OT Re-Evaluation 06/04/21   ? OT Start Time 1400   ? OT Stop Time 1439   ? OT Time Calculation (min) 39 min   ? Activity Tolerance Patient tolerated treatment well   ? Behavior During Therapy [facility] for tasks assessed/performed   ? ?  ?  ? ?  ? ? ?Past Medical History:  ?Diagnosis Date  ? Diabetes mellitus without complication ([place])   ? Endometrial adenocarcinoma ([place])   ? GERD (gastroesophageal reflux disease)   ? Hematochezia   ? Hyperlipidemia   ? Hypertension   ? IDA (iron deficiency anemia)   ? Inguinal lymphadenopathy 07/13/2015  ? Irritable bowel syndrome   ? Obesity   ? Osteoarthritis   ? Sleep apnea   ? ? ?Past Surgical History:  ?Procedure Laterality Date  ? ABDOMINAL HYSTERECTOMY  feb 2013  ? CARPAL TUNNEL RELEASE Left 03/20/2021  ? Procedure: CARPAL TUNNEL RELEASE ENDOSCOPIC;  Surgeon: Corky Mull, MD;  Location: [facility] ORS;  Service: Orthopedics;  Laterality: Left;  ? COLONOSCOPY WITH PROPOFOL N/A 08/02/2015  ? Procedure: COLONOSCOPY WITH PROPOFOL;  Surgeon: Manya Silvas, MD;  Location: [facility] ENDOSCOPY;  Service: Endoscopy;  Laterality: N/A;  ? ESOPHAGOGASTRODUODENOSCOPY (EGD) WITH PROPOFOL N/A 08/02/2015  ? Procedure: ESOPHAGOGASTRODUODENOSCOPY (EGD) WITH PROPOFOL;  Surgeon: Manya Silvas, MD;  Location: [facility] ENDOSCOPY;  Service: Endoscopy;  Laterality: N/A;  ? ? ?There were no vitals filed for this visit. ? ? Subjective Assessment - 05/14/21 1440   ? ? Subjective  Doing better - still little numb in fingers -using more- have trouble with gripping and picking pu   ?  Pertinent History Pt was seen at [facility] on 04/01/21 - for first postop appointment following a endoscopic left carpal tunnel release. Surgery was performed by Dr. Roland Rack on 03/20/2021. Overall the patient feels that she is doing well. She does report a 6 out of 10 pain score in the left wrist and hand but she is only taking Tylenol as needed for discomfort. The patient denies any falls or trauma affecting left wrist or hand since surgery. She denies any signs of infection at home such as fevers or any drainage from the left wrist incision site. The patient has not been doing much lifting pushing or pulling, she does report continued swelling in the left hand and fingers.Refer to OT   ? Patient Stated Goals Want my L hand swelling and  pain better so I can use it around the house   ? Currently in Pain? No/denies   ? ?  ?  ? ?  ? ? ? ? ? [facility] OT Assessment - 05/14/21 0001   ? ?  ? AROM  ? Left Wrist Extension 60 Degrees   ? Left Wrist Flexion 90 Degrees   ?  ? Strength  ? Right Hand Grip (lbs) 41   ? Right Hand Lateral Pinch 14 lbs   ? Right Hand 3 Point Pinch 11 lbs   ? Left Hand Grip (lbs) 34   ? Left Hand Lateral Pinch  14 lbs   ? Left Hand 3 Point Pinch 11 lbs   ? ?  ?  ? ?  ? ? ?Assess grip , prehension - see flowsheet  ?Wrist extention still impaired  ? ?  ?cont with scar massage  ?Cont with isotoner glove- with cica scar pad night time -  edema improved - cont with contrast or heat prior to HEP ? Soft tissue massage -MC and CT spreads as well as webspace ?Scar massage  assess -not as tender - but still thick ? AAROM for wrist flexion, ext- pt to focus on composite extnetion table slides 20 reps- pt report they don't have table - pt to do on counter  ?20 reps done  ?  ?And tendon glides  ?PA and RA AROM of thumb  ?Opposition to all digits and sliding down 5th  WNL all AROM  ?Upgrade to teal but to soft - upgrade to green medium putty for gripping , add lat and 3 point to- 12 reps x 2 - no pain  ?To do 2 x day  ?2  lbs weight 2 sets of 12 for wrist in all planes ?And elbow flexion ? GTB for scapula retraction - 12 reps  ?2 sets - no pain  ?Attempted wall pushups - pain in shoulder HOLD off  ?  ? ? ? ? ? ? ? ? ? ? ? ? ? ? OT Education - 05/14/21 1441   ? ? Education Details progress and changes to HEP   ? Person(s) Educated Patient;Other (comment)   ? Methods Explanation;Demonstration;Tactile cues;Verbal cues;Handout   ? Comprehension Verbal cues required;Returned demonstration;Verbalized understanding   ? ?  ?  ? ?  ? ? ? OT Short Term Goals - 04/09/21 1722   ? ?  ? OT SHORT TERM GOAL #1  ? Title Pain/numbness  on L wrist/ hand  decrease to less than 2/10 with use during day   ? Baseline 6/10 pain with use and AROM - numbness still in digits constant per pt   ? Time 4   ? Period Weeks   ? Status New   ? Target Date 05/07/21   ?  ? OT SHORT TERM GOAL #2  ? Title Pt to be ind in HEP to decrease pain , increase AROM in wrist and digits to [facility] to use hand in eating , bathing   ? Baseline Pain increase to 6/10 at wrist , thumb and digits AROM - tender did now allow OT to touch hand or wrist until after contrast and review of HEP   ? Time 3   ? Status New   ? Target Date 04/30/21   ? ?  ?  ? ?  ? ? ? ? OT Long Term Goals - 04/09/21 1725   ? ?  ? OT LONG TERM GOAL #1  ? Title Pt L grip  strength increase to more than 50% compare to R hand to use L hand  in bathing , turn doorknob without increase symptoms   ? Baseline 3 wks s/p . pain and tenderness over scar and hand - 6/10 oain with AROM   ? Time 8   ? Period Weeks   ? Status New   ? Target Date 06/04/21   ?  ? OT LONG TERM GOAL #2  ? Title Strength and AROM increase in L hand and wrist for pt to pull and push door, carry more than 5 lbs , cut food and hold dring  without increase symptoms   ? Baseline 3 wks s/p - pain 6/10 in wrist and thumb - stender over scar   ? Time 8   ? Period Weeks   ? Status New   ? Target Date 06/04/21   ? ?  ?  ? ?  ? ? ? ? ? ? ? ? Plan - 05/14/21 1442    ? ? Clinical Impression Statement Pt present at OT about 6  wks s/p L arthroscopic CTR  by Dr Roland Rack on 03/20/21 - pt report numbness in fingers still but pain better- and aid doing scar massage. Not as tender this date at scar since about 2 wks ago. Grip and lat grip increased in L hand since last time. Wrist extention still limited and scar thick. Upgrade pt to medium green putty for grip and prenhension- 2 lbs weight for wrist and elbow - add to HEP . Cont to have  increase scar tissue limiting her wrist ext and  grip strength  limiting her functional use of L hand in ADL's and IADl's Pt can benefit from skilled OT services   ? OT Occupational Profile and History Problem Focused Assessment - Including review of records relating to presenting problem   ? Occupational performance deficits (Please refer to evaluation for details): ADL's;IADL's;Play;Leisure;Social Participation   ? Body Structure / Function / Physical Skills ADL;Strength;Decreased knowledge of use of DME;Decreased knowledge of precautions;Flexibility;ROM;IADL;Edema;UE functional use;Pain;Scar mobility   ? Rehab Potential Good   ? Clinical Decision Making Limited treatment options, no task modification necessary   ? Comorbidities Affecting Occupational Performance: None   ? Modification or Assistance to Complete Evaluation  No modification of tasks or assist necessary to complete eval   ? OT Frequency 2x / week   ? OT Duration 6 weeks   ? OT Treatment/Interventions Self-care/ADL training;Ultrasound;Contrast Bath;Therapeutic exercise;Manual Therapy;Patient/family education;Passive range of motion;DME and/or AE instruction;Splinting;Fluidtherapy;Paraffin;Scar mobilization   ? Consulted and Agree with Plan of Care Patient   ? ?  ?  ? ?  ? ? ?Patient will benefit from skilled therapeutic intervention in order to improve the following deficits and impairments:   ?Body Structure / Function / Physical Skills: ADL, Strength, Decreased knowledge of use of DME,  Decreased knowledge of precautions, Flexibility, ROM, IADL, Edema, UE functional use, Pain, Scar mobility ?  ?  ? ? ?Visit Diagnosis: ?Pain in left hand ? ?Muscle weakness (generalized) ? ?Pain in left wrist

## 2021-05-16 ENCOUNTER — Ambulatory Visit: Payer: Medicare Other | Admitting: Occupational Therapy

## 2021-05-16 ENCOUNTER — Other Ambulatory Visit: Payer: Self-pay

## 2021-05-16 DIAGNOSIS — M79642 Pain in left hand: Secondary | ICD-10-CM | POA: Diagnosis not present

## 2021-05-16 NOTE — Therapy (Signed)
[place] ?[facility] ?[address] ?[place], [place], [address] ?Phone: [phone]   Fax:  [phone] ? ?Occupational Therapy Treatment ? ?Patient Details  ?Name: Christine Lawson ?MRN: [id number] ?Date of Birth: September 16, 1960 ?Referring Provider (OT): DR Poggi ? ? ?Encounter Date: 05/16/2021 ? ? OT End of Session - 05/16/21 1407   ? ? Visit Number 8   ? Number of Visits 14   ? Date for OT Re-Evaluation 06/04/21   ? OT Start Time 1402   ? OT Stop Time 0109   ? OT Time Calculation (min) 42 min   ? Activity Tolerance Patient tolerated treatment well   ? Behavior During Therapy [facility] for tasks assessed/performed   ? ?  ?  ? ?  ? ? ?Past Medical History:  ?Diagnosis Date  ? Diabetes mellitus without complication ([place])   ? Endometrial adenocarcinoma ([place])   ? GERD (gastroesophageal reflux disease)   ? Hematochezia   ? Hyperlipidemia   ? Hypertension   ? IDA (iron deficiency anemia)   ? Inguinal lymphadenopathy 07/13/2015  ? Irritable bowel syndrome   ? Obesity   ? Osteoarthritis   ? Sleep apnea   ? ? ?Past Surgical History:  ?Procedure Laterality Date  ? ABDOMINAL HYSTERECTOMY  feb 2013  ? CARPAL TUNNEL RELEASE Left 03/20/2021  ? Procedure: CARPAL TUNNEL RELEASE ENDOSCOPIC;  Surgeon: Corky Mull, MD;  Location: [facility] ORS;  Service: Orthopedics;  Laterality: Left;  ? COLONOSCOPY WITH PROPOFOL N/A 08/02/2015  ? Procedure: COLONOSCOPY WITH PROPOFOL;  Surgeon: Manya Silvas, MD;  Location: [facility] ENDOSCOPY;  Service: Endoscopy;  Laterality: N/A;  ? ESOPHAGOGASTRODUODENOSCOPY (EGD) WITH PROPOFOL N/A 08/02/2015  ? Procedure: ESOPHAGOGASTRODUODENOSCOPY (EGD) WITH PROPOFOL;  Surgeon: Manya Silvas, MD;  Location: [facility] ENDOSCOPY;  Service: Endoscopy;  Laterality: N/A;  ? ? ?There were no vitals filed for this visit. ? ? Subjective Assessment - 05/16/21 1405   ? ? Subjective  Some pain when lifting or gripping - did do the table slides like you told me - I need new scar pad - that  one did not want to stick - doing better with dressing, cannot cut yet   ? Pertinent History Pt was seen at [facility] on 04/01/21 - for first postop appointment following a endoscopic left carpal tunnel release. Surgery was performed by Dr. Roland Rack on 03/20/2021. Overall the patient feels that she is doing well. She does report a 6 out of 10 pain score in the left wrist and hand but she is only taking Tylenol as needed for discomfort. The patient denies any falls or trauma affecting left wrist or hand since surgery. She denies any signs of infection at home such as fevers or any drainage from the left wrist incision site. The patient has not been doing much lifting pushing or pulling, she does report continued swelling in the left hand and fingers.Refer to OT   ? Patient Stated Goals Want my L hand swelling and  pain better so I can use it around the house   ? Currently in Pain? Yes   ? Pain Score 3    ? Pain Location Wrist   ? Pain Orientation Left   ? Pain Descriptors / Indicators Aching   ? Pain Onset More than a month ago   ? Pain Frequency Intermittent   ? ?  ?  ? ?  ? ? ? ? ? ? ? ? ? ? ? ? ? ? ? OT  Treatments/Exercises (OP) - 05/16/21 0001   ? ?  ? LUE Paraffin  ? Number Minutes Paraffin 8 Minutes   ? LUE Paraffin Location Hand;Wrist   ? Comments decrease pain and scar tissue   ? ?  ?  ? ?  ? ?Assess grip , prehension - see flowsheet  ?Wrist extention still impaired  ?  ?  ?cont with scar massage  ?Cont with isotoner glove- with cica scar pad night time -  edema improved - cont with contrast or heat prior to HEP ? Soft tissue massage -MC and CT spreads as well as webspace ?Scar massage  done by OT - mini massager over cica scar pad- stone massage tool over volar wrist , hand and forearm ? ? AAROM for wrist flexion, ext- pt to focus on composite extnetion table slides 20 reps- pt report they don't have table - pt to do on counter  ?20 reps done  ?  ?REview with pt green medium putty for gripping, lat and 3 point to-  12 reps x 2 - no pain  ?To do 2 x day  ?2 lbs weight 2 sets of 12 for wrist in all planes ?And elbow flexion ? GTB for scapula retraction - 12 reps  ?2 sets - no pain  ? ? ? ? ? ? ? ? OT Education - 05/16/21 1407   ? ? Education Details progress and changes to HEP   ? Person(s) Educated Patient;Other (comment)   ? Methods Explanation;Demonstration;Tactile cues;Verbal cues;Handout   ? Comprehension Verbal cues required;Returned demonstration;Verbalized understanding   ? ?  ?  ? ?  ? ? ? OT Short Term Goals - 04/09/21 1722   ? ?  ? OT SHORT TERM GOAL #1  ? Title Pain/numbness  on L wrist/ hand  decrease to less than 2/10 with use during day   ? Baseline 6/10 pain with use and AROM - numbness still in digits constant per pt   ? Time 4   ? Period Weeks   ? Status New   ? Target Date 05/07/21   ?  ? OT SHORT TERM GOAL #2  ? Title Pt to be ind in HEP to decrease pain , increase AROM in wrist and digits to [facility] to use hand in eating , bathing   ? Baseline Pain increase to 6/10 at wrist , thumb and digits AROM - tender did now allow OT to touch hand or wrist until after contrast and review of HEP   ? Time 3   ? Status New   ? Target Date 04/30/21   ? ?  ?  ? ?  ? ? ? ? OT Long Term Goals - 04/09/21 1725   ? ?  ? OT LONG TERM GOAL #1  ? Title Pt L grip  strength increase to more than 50% compare to R hand to use L hand  in bathing , turn doorknob without increase symptoms   ? Baseline 3 wks s/p . pain and tenderness over scar and hand - 6/10 oain with AROM   ? Time 8   ? Period Weeks   ? Status New   ? Target Date 06/04/21   ?  ? OT LONG TERM GOAL #2  ? Title Strength and AROM increase in L hand and wrist for pt to pull and push door, carry more than 5 lbs , cut food and hold dring without increase symptoms   ? Baseline 3 wks s/p - pain 6/10  in wrist and thumb - stender over scar   ? Time 8   ? Period Weeks   ? Status New   ? Target Date 06/04/21   ? ?  ?  ? ?  ? ? ? ? ? ? ? ? Plan - 05/16/21 1408   ? ? Clinical Impression  Statement Pt present at OT about 6  1/2 wks s/p L arthroscopic CTR  by Dr Roland Rack on 03/20/21 - pt report numbness in fingers still but pain better- and aid doing scar massage. Not as tender this week at scar since about 2 wks ago. Grip and lat grip increased in L hand. Wrist extention still limited and scar thick. Upgrade pt to medium green putty this week  for grip and prenhension- 2 lbs weight for wrist and elbow doing well as well as GTB for scapula retraction. Cont to have  increase scar tissue limiting her wrist ext and  grip strength  limiting her functional use of L hand in ADL's and IADl's Pt can benefit from skilled OT services   ? OT Occupational Profile and History Problem Focused Assessment - Including review of records relating to presenting problem   ? Occupational performance deficits (Please refer to evaluation for details): ADL's;IADL's;Play;Leisure;Social Participation   ? Body Structure / Function / Physical Skills ADL;Strength;Decreased knowledge of use of DME;Decreased knowledge of precautions;Flexibility;ROM;IADL;Edema;UE functional use;Pain;Scar mobility   ? Rehab Potential Good   ? Clinical Decision Making Limited treatment options, no task modification necessary   ? Comorbidities Affecting Occupational Performance: None   ? Modification or Assistance to Complete Evaluation  No modification of tasks or assist necessary to complete eval   ? OT Frequency 2x / week   ? OT Duration 6 weeks   ? OT Treatment/Interventions Self-care/ADL training;Ultrasound;Contrast Bath;Therapeutic exercise;Manual Therapy;Patient/family education;Passive range of motion;DME and/or AE instruction;Splinting;Fluidtherapy;Paraffin;Scar mobilization   ? Consulted and Agree with Plan of Care Patient   ? ?  ?  ? ?  ? ? ?Patient will benefit from skilled therapeutic intervention in order to improve the following deficits and impairments:   ?Body Structure / Function / Physical Skills: ADL, Strength, Decreased knowledge of use  of DME, Decreased knowledge of precautions, Flexibility, ROM, IADL, Edema, UE functional use, Pain, Scar mobility ?  ?  ? ? ?Visit Diagnosis: ?Pain in left hand ? ?Muscle weakness (generalized) ? ?Scar

## 2021-05-21 ENCOUNTER — Encounter: Payer: Medicare Other | Admitting: Occupational Therapy

## 2021-05-23 ENCOUNTER — Ambulatory Visit: Payer: Medicare Other | Admitting: Occupational Therapy

## 2021-05-23 ENCOUNTER — Other Ambulatory Visit: Payer: Self-pay

## 2021-05-23 DIAGNOSIS — M25532 Pain in left wrist: Secondary | ICD-10-CM

## 2021-05-23 DIAGNOSIS — M79642 Pain in left hand: Secondary | ICD-10-CM | POA: Diagnosis not present

## 2021-05-23 DIAGNOSIS — M6281 Muscle weakness (generalized): Secondary | ICD-10-CM

## 2021-05-23 DIAGNOSIS — L905 Scar conditions and fibrosis of skin: Secondary | ICD-10-CM

## 2021-05-23 NOTE — Therapy (Signed)
[facility] ?[address] ?[place], [place], [address] ?Phone: [phone]   Fax:  [phone] ? ?Occupational Therapy Treatment ? ?Patient Details  ?Name: Christine Lawson ?MRN: [id number] ?Date of Birth: [birth year] ?Referring Provider (OT): DR Poggi ? ? ?Encounter Date: 05/23/2021 ? ? OT End of Session - 05/23/21 1423   ? ? Visit Number 9   ? Number of Visits 14   ? Date for OT Re-Evaluation 06/04/21   ? OT Start Time 1400   ? OT Stop Time 1440   ? OT Time Calculation (min) 40 min   ? Activity Tolerance Patient tolerated treatment well   ? Behavior During Therapy [facility] for tasks assessed/performed   ? ?  ?  ? ?  ? ? ?Past Medical History:  ?Diagnosis Date  ? Diabetes mellitus without complication ([place])   ? Endometrial adenocarcinoma ([place])   ? GERD (gastroesophageal reflux disease)   ? Hematochezia   ? Hyperlipidemia   ? Hypertension   ? IDA (iron deficiency anemia)   ? Inguinal lymphadenopathy 07/13/2015  ? Irritable bowel syndrome   ? Obesity   ? Osteoarthritis   ? Sleep apnea   ? ? ?Past Surgical History:  ?Procedure Laterality Date  ? ABDOMINAL HYSTERECTOMY  feb 2013  ? CARPAL TUNNEL RELEASE Left 03/20/2021  ? Procedure: CARPAL TUNNEL RELEASE ENDOSCOPIC;  Surgeon: Corky Mull, MD;  Location: [facility] ORS;  Service: Orthopedics;  Laterality: Left;  ? COLONOSCOPY WITH PROPOFOL N/A 08/02/2015  ? Procedure: COLONOSCOPY WITH PROPOFOL;  Surgeon: Manya Silvas, MD;  Location: [facility] ENDOSCOPY;  Service: Endoscopy;  Laterality: N/A;  ? ESOPHAGOGASTRODUODENOSCOPY (EGD) WITH PROPOFOL N/A 08/02/2015  ? Procedure: ESOPHAGOGASTRODUODENOSCOPY (EGD) WITH PROPOFOL;  Surgeon: Manya Silvas, MD;  Location: [facility] ENDOSCOPY;  Service: Endoscopy;  Laterality: N/A;  ? ? ?There were no vitals filed for this visit. ? ? Subjective Assessment - 05/23/21 1421   ? ? Subjective  I am not cutting my food yet , my sister is still doing a lot and my aid - more strength- weight and putty  okay   ? Pertinent History Pt was seen at [facility] on 04/01/21 - for first postop appointment following a endoscopic left carpal tunnel release. Surgery was performed by Dr. Roland Rack on 03/20/2021. Overall the patient feels that she is doing well. She does report a 6 out of 10 pain score in the left wrist and hand but she is only taking Tylenol as needed for discomfort. The patient denies any falls or trauma affecting left wrist or hand since surgery. She denies any signs of infection at home such as fevers or any drainage from the left wrist incision site. The patient has not been doing much lifting pushing or pulling, she does report continued swelling in the left hand and fingers.Refer to OT   ? Patient Stated Goals Want my L hand swelling and  pain better so I can use it around the house   ? Currently in Pain? Yes   ? Pain Score 3    ? Pain Location Hand   ? Pain Orientation Left   ? Pain Descriptors / Indicators Tender   ? Pain Onset More than a month ago   ? ?  ?  ? ?  ? ? ? ? ? [place] OT Assessment - 05/23/21 0001   ? ?  ? Strength  ? Right Hand Grip (lbs) 41   ? Right Hand Lateral Pinch 14 lbs   ?  Right Hand 3 Point Pinch 11 lbs   ? Left Hand Grip (lbs) 42   ? Left Hand Lateral Pinch 13 lbs   ? Left Hand 3 Point Pinch 11 lbs   ? ?  ?  ? ?  ? ? ? ? ?  ?Assess grip , prehension - see flowsheet - L hand stronger than R hand now  ?Wrist extention still impaired  but not limiting pt ?  ?  ?cont with scar massage at home ?Cont with isotoner glove- with cica scar pad night time -  edema improved - cont with contrast or heat prior to HEP if needed ?   ? AAROM for wrist flexion, ext- pt to focus on composite extnetion table slides 20 reps- pt report they don't have table - pt to do on counter  ?20 reps done  ?  ?REview with pt green medium putty for gripping, lat and 3 point to- 12 reps x 2 - no pain  ?To do 2 x day  ?Increase patient to 5 pounds for wrist except extension and flexion to continue with 2 pounds 1 set of 12 for  wrist  ?And elbow flexion 5 lbs - slow down and sister to watch pt to not elevate shoulders ? GTB for scapula retraction - 12 reps  ?2 sets - no pain  ? ?Simulated with patient activities in the gym like pushing and pulling heavy door, pushing off a chair, carry 6 pounds with no pain-had pain and discomfort with 7 pounds.  Coming simulated food with a knife and fork had some discomfort with fork and left hand, provided built-up grip that helped for discomfort. ?Able to open and close large jar and open package ? ? ? ? ? ? ? ? ? ? ? ? ? ? OT Education - 05/23/21 1423   ? ? Education Details progress and changes to HEP   ? Person(s) Educated Patient;Other (comment)   ? Methods Explanation;Demonstration;Tactile cues;Verbal cues;Handout   ? Comprehension Verbal cues required;Returned demonstration;Verbalized understanding   ? ?  ?  ? ?  ? ? ? OT Short Term Goals - 04/09/21 1722   ? ?  ? OT SHORT TERM GOAL #1  ? Title Pain/numbness  on L wrist/ hand  decrease to less than 2/10 with use during day   ? Baseline 6/10 pain with use and AROM - numbness still in digits constant per pt   ? Time 4   ? Period Weeks   ? Status New   ? Target Date 05/07/21   ?  ? OT SHORT TERM GOAL #2  ? Title Pt to be ind in HEP to decrease pain , increase AROM in wrist and digits to [facility] to use hand in eating , bathing   ? Baseline Pain increase to 6/10 at wrist , thumb and digits AROM - tender did now allow OT to touch hand or wrist until after contrast and review of HEP   ? Time 3   ? Status New   ? Target Date 04/30/21   ? ?  ?  ? ?  ? ? ? ? OT Long Term Goals - 04/09/21 1725   ? ?  ? OT LONG TERM GOAL #1  ? Title Pt L grip  strength increase to more than 50% compare to R hand to use L hand  in bathing , turn doorknob without increase symptoms   ? Baseline 3 wks s/p . pain and tenderness over scar and  hand - 6/10 oain with AROM   ? Time 8   ? Period Weeks   ? Status New   ? Target Date 06/04/21   ?  ? OT LONG TERM GOAL #2  ? Title Strength and  AROM increase in L hand and wrist for pt to pull and push door, carry more than 5 lbs , cut food and hold dring without increase symptoms   ? Baseline 3 wks s/p - pain 6/10 in wrist and thumb - stender over scar   ? Time 8   ? Period Weeks   ? Status New   ? Target Date 06/04/21   ? ?  ?  ? ?  ? ? ? ? ? ? ? ? Plan - 05/23/21 1443   ? ? Clinical Impression Statement Pt present at OT about 7 1/2 wks s/p L arthroscopic CTR  by Dr Roland Rack on 03/20/21 - pt report numbness in fingers still but pain better- and aid doing scar massage.  Patient able to push and pull heavy door open with left hand, carry 6 pounds without pain, able to upgrade patient to 5 pound weights for elbow and wrist except wrist extension and flexion.  Pushing up from chair and holding a fork with cutting still bother patient.  Built-up grip provided for fork, with less pain. . Grip and lat grip within normal range to  L hand. Wrist extention still limited and scar thick.  Patient to continue with medium green putty  for grip and prenhension. Cont to have  increase scar tissue limiting her wrist, elbow and hand strength  limiting her functional use of L hand in ADL's and IADl's Pt can benefit from skilled OT services   ? OT Occupational Profile and History Problem Focused Assessment - Including review of records relating to presenting problem   ? Occupational performance deficits (Please refer to evaluation for details): ADL's;IADL's;Play;Leisure;Social Participation   ? Body Structure / Function / Physical Skills ADL;Strength;Decreased knowledge of use of DME;Decreased knowledge of precautions;Flexibility;ROM;IADL;Edema;UE functional use;Pain;Scar mobility   ? Rehab Potential Good   ? Clinical Decision Making Limited treatment options, no task modification necessary   ? Comorbidities Affecting Occupational Performance: None   ? OT Frequency 2x / week   ? OT Duration 6 weeks   ? OT Treatment/Interventions Self-care/ADL training;Ultrasound;Contrast  Bath;Therapeutic exercise;Manual Therapy;Patient/family education;Passive range of motion;DME and/or AE instruction;Splinting;Fluidtherapy;Paraffin;Scar mobilization   ? Consulted and Agree with Plan of Car

## 2021-05-28 ENCOUNTER — Encounter: Payer: Medicare Other | Admitting: Occupational Therapy

## 2021-05-29 ENCOUNTER — Other Ambulatory Visit: Payer: Self-pay

## 2021-05-29 ENCOUNTER — Encounter: Payer: Medicare Other | Attending: Internal Medicine | Admitting: Dietician

## 2021-05-29 DIAGNOSIS — Z794 Long term (current) use of insulin: Secondary | ICD-10-CM | POA: Diagnosis not present

## 2021-05-29 DIAGNOSIS — Z6841 Body Mass Index (BMI) 40.0 and over, adult: Secondary | ICD-10-CM | POA: Insufficient documentation

## 2021-05-29 DIAGNOSIS — E1142 Type 2 diabetes mellitus with diabetic polyneuropathy: Secondary | ICD-10-CM | POA: Insufficient documentation

## 2021-05-29 DIAGNOSIS — E1169 Type 2 diabetes mellitus with other specified complication: Secondary | ICD-10-CM | POA: Diagnosis not present

## 2021-05-29 DIAGNOSIS — E1165 Type 2 diabetes mellitus with hyperglycemia: Secondary | ICD-10-CM

## 2021-05-29 DIAGNOSIS — Z713 Dietary counseling and surveillance: Secondary | ICD-10-CM | POA: Diagnosis not present

## 2021-05-29 DIAGNOSIS — E1159 Type 2 diabetes mellitus with other circulatory complications: Secondary | ICD-10-CM | POA: Insufficient documentation

## 2021-05-29 DIAGNOSIS — E785 Hyperlipidemia, unspecified: Secondary | ICD-10-CM | POA: Insufficient documentation

## 2021-05-29 DIAGNOSIS — I152 Hypertension secondary to endocrine disorders: Secondary | ICD-10-CM | POA: Diagnosis not present

## 2021-05-30 ENCOUNTER — Ambulatory Visit: Payer: Medicare Other | Admitting: Occupational Therapy

## 2021-05-30 DIAGNOSIS — L905 Scar conditions and fibrosis of skin: Secondary | ICD-10-CM

## 2021-05-30 DIAGNOSIS — M6281 Muscle weakness (generalized): Secondary | ICD-10-CM

## 2021-05-30 DIAGNOSIS — M79642 Pain in left hand: Secondary | ICD-10-CM

## 2021-05-30 DIAGNOSIS — M25532 Pain in left wrist: Secondary | ICD-10-CM

## 2021-05-30 NOTE — Therapy (Signed)
[facility] ?[address] ?[place], [place], [address] ?Phone: [phone]   Fax:  [phone] ? ?Occupational Therapy Treatment ? ?Patient Details  ?Name: Christine Lawson ?MRN: [id number] ?Date of Birth: Aug 19, 1960 ?Referring Provider (OT): DR Poggi ? ? ?Encounter Date: 05/30/2021 ? ? OT End of Session - 05/30/21 1358   ? ? Visit Number 10   ? Number of Visits 14   ? Date for OT Re-Evaluation 06/04/21   ? OT Start Time 1330   ? OT Stop Time 1409   ? OT Time Calculation (min) 39 min   ? Activity Tolerance Patient tolerated treatment well   ? Behavior During Therapy [facility] for tasks assessed/performed   ? ?  ?  ? ?  ? ? ?Past Medical History:  ?Diagnosis Date  ? Diabetes mellitus without complication ([place])   ? Endometrial adenocarcinoma ([place])   ? GERD (gastroesophageal reflux disease)   ? Hematochezia   ? Hyperlipidemia   ? Hypertension   ? IDA (iron deficiency anemia)   ? Inguinal lymphadenopathy 07/13/2015  ? Irritable bowel syndrome   ? Obesity   ? Osteoarthritis   ? Sleep apnea   ? ? ?Past Surgical History:  ?Procedure Laterality Date  ? ABDOMINAL HYSTERECTOMY  feb 2013  ? CARPAL TUNNEL RELEASE Left 03/20/2021  ? Procedure: CARPAL TUNNEL RELEASE ENDOSCOPIC;  Surgeon: Corky Mull, MD;  Location: [facility] ORS;  Service: Orthopedics;  Laterality: Left;  ? COLONOSCOPY WITH PROPOFOL N/A 08/02/2015  ? Procedure: COLONOSCOPY WITH PROPOFOL;  Surgeon: Manya Silvas, MD;  Location: [facility] ENDOSCOPY;  Service: Endoscopy;  Laterality: N/A;  ? ESOPHAGOGASTRODUODENOSCOPY (EGD) WITH PROPOFOL N/A 08/02/2015  ? Procedure: ESOPHAGOGASTRODUODENOSCOPY (EGD) WITH PROPOFOL;  Surgeon: Manya Silvas, MD;  [facility] ENDOSCOPY;  Service: Endoscopy;  Laterality: N/A;  ? ? ?There were no vitals filed for this visit. ? ? Subjective Assessment - 05/30/21 1356   ? ? Subjective  Doing better- but I admit did not cut my food myself - I am spoiled -   did my weights and putty   ?  Pertinent History Pt was seen at [facility] on 04/01/21 - for first postop appointment following a endoscopic left carpal tunnel release. Surgery was performed by Dr. Roland Rack on 03/20/2021. Overall the patient feels that she is doing well. She does report a 6 out of 10 pain score in the left wrist and hand but she is only taking Tylenol as needed for discomfort. The patient denies any falls or trauma affecting left wrist or hand since surgery. She denies any signs of infection at home such as fevers or any drainage from the left wrist incision site. The patient has not been doing much lifting pushing or pulling, she does report continued swelling in the left hand and fingers.Refer to OT   ? Patient Stated Goals Want my L hand swelling and  pain better so I can use it around the house   ? Currently in Pain? Yes   ? Pain Score 2    ? Pain Location Wrist   ? Pain Orientation Left   ? Pain Descriptors / Indicators Tender   ? ?  ?  ? ?  ? ? ? ? ? ? ? ? ? ? ? ? ? ? ? OT Treatments/Exercises (OP) - 05/30/21 0001   ? ?  ? LUE Paraffin  ? Number Minutes Paraffin 8 Minutes   ? LUE Paraffin Location Hand;Wrist   ? Comments  decrease scar tissue and ROM   ? ?  ?  ? ?  ?  ?Wrist extention still impaired  but not limiting pt except pushing out of chair ?  ?  ?cont with scar massage at home -done with pt mini massager and on sides of scar - 1 cm adhesion - limiting weightbearing through wrist and carrying more than 7 pounds ?Cont with isotoner glove- with cica scar pad night time -  edema improved - cont with contrast or heat prior to HEP if needed ?   ? AAROM for wrist flexion, ext- pt to focus on composite extnetion table slides 20 reps- pt report they don't have table - pt to do on counter  ?20 reps done  ?  ?Continue green medium putty for gripping, lat and 3 point to- 12 reps x 2 - no pain  ?To do 2 x day  ?Continue 5 pounds for wrist except extension and flexion to continue with 2 pounds 1 set of 12 for wrist  ?And elbow flexion 5  lbs - slow down and sister to watch pt to not elevate shoulders ? GTB for scapula retraction - 12 reps  ?2 sets - no pain  ?  ?Simulated with patient activities in the gym last time like pushing and pulling heavy door, pushing off a chair, carry 6 pounds with no pain-had pain and discomfort with 7 pounds.  Coming simulated food with a knife and fork had some discomfort with fork and left hand, provided built-up grip that helped for discomfort. ?Able to open and close large jar and open package ?This visit she could carry 7 pounds had increased ease with holding fork and cutting food did built-up handle or grip for fork encourage patient to do at home-patient admits she got spoiled and not doing it. ?  ?  ?  ? ? ? ? ? ? ? ? OT Education - 05/30/21 1358   ? ? Education Details progress and changes to HEP   ? Person(s) Educated Patient;Other (comment)   ? Methods Explanation;Demonstration;Tactile cues;Verbal cues;Handout   ? Comprehension Verbal cues required;Returned demonstration;Verbalized understanding   ? ?  ?  ? ?  ? ? ? OT Short Term Goals - 04/09/21 1722   ? ?  ? OT SHORT TERM GOAL #1  ? Title Pain/numbness  on L wrist/ hand  decrease to less than 2/10 with use during day   ? Baseline 6/10 pain with use and AROM - numbness still in digits constant per pt   ? Time 4   ? Period Weeks   ? Status New   ? Target Date 05/07/21   ?  ? OT SHORT TERM GOAL #2  ? Title Pt to be ind in HEP to decrease pain , increase AROM in wrist and digits to [facility] to use hand in eating , bathing   ? Baseline Pain increase to 6/10 at wrist , thumb and digits AROM - tender did now allow OT to touch hand or wrist until after contrast and review of HEP   ? Time 3   ? Status New   ? Target Date 04/30/21   ? ?  ?  ? ?  ? ? ? ? OT Long Term Goals - 04/09/21 1725   ? ?  ? OT LONG TERM GOAL #1  ? Title Pt L grip  strength increase to more than 50% compare to R hand to use L hand  in bathing , turn doorknob  without increase symptoms   ? Baseline 3  wks s/p . pain and tenderness over scar and hand - 6/10 oain with AROM   ? Time 8   ? Period Weeks   ? Status New   ? Target Date 06/04/21   ?  ? OT LONG TERM GOAL #2  ? Title Strength and AROM increase in L hand and wrist for pt to pull and push door, carry more than 5 lbs , cut food and hold dring without increase symptoms   ? Baseline 3 wks s/p - pain 6/10 in wrist and thumb - stender over scar   ? Time 8   ? Period Weeks   ? Status New   ? Target Date 06/04/21   ? ?  ?  ? ?  ? ? ? ? ? ? ? ? Plan - 05/30/21 1409   ? ? Clinical Impression Statement Pt present at OT about 7 1/2 wks s/p L arthroscopic CTR  by Dr Roland Rack on 03/20/21 -Pain better and aid doing scar massage.  Patient able to push and pull heavy door open with left hand, carry 7 pounds without pain, able to upgrade patient to 5 pound weights for elbow last 2 sessions, and 2  for wrist extension and flexion.  Pushing up from chair still bother but pt doing table slides.  Built-up grip provided for fork, and able to do easy cutting food.  Grip and lat grip within normal range to  L hand. Wrist extention still limited and scar 1 cm area thick. Review scar massage and video provided on pt phone for her aid.  Patient to continue with medium green putty  for grip and prenhension. Cont to have  increase scar tissue limiting her wrist, elbow and hand strength  limiting her functional use of L hand in ADL's and IADl's Pt can benefit from skilled OT services   ? OT Occupational Profile and History Problem Focused Assessment - Including review of records relating to presenting problem   ? Occupational performance deficits (Please refer to evaluation for details): ADL's;IADL's;Play;Leisure;Social Participation   ? Body Structure / Function / Physical Skills ADL;Strength;Decreased knowledge of use of DME;Decreased knowledge of precautions;Flexibility;ROM;IADL;Edema;UE functional use;Pain;Scar mobility   ? Rehab Potential Good   ? Clinical Decision Making Limited  treatment options, no task modification necessary   ? Comorbidities Affecting Occupational Performance: None   ? Modification or Assistance to Complete Evaluation  No modification of tasks or assist necessary to c

## 2021-05-30 NOTE — Progress Notes (Signed)
Medical Nutrition Therapy: Visit start time: 1330  end time: 1400  ?Assessment:  Diagnosis: Type 2 diabetes, obesity ?Medical history changes: no changes ?Psychosocial issues/ stress concerns: none ? ?Current weight: not measured today, phone visit ?265lbs   BMI: 47 (recent measurement at another office)  ? ?Medications, supplement changes: reconciled list in medical record ? ?Progress and evaluation:  ?Patient reports improvement in GI symptoms since making dietary changes to better control starches and carb intake. ?She likes ice cream, has been consuming low sugar options. ?Most recent HbA1C 7.9% (05/21/21), up from previous 7.5% (12/2020) ? ?  ?Physical activity: ADLs ? ?Dietary Intake:  ?Usual eating pattern includes 3 meals and 1-2 snacks per day. ?Dining out frequency: ? meals per week. ? ?Breakfast: 2 pancakes with sugar free syrup ?Snack: none ?Lunch: graham crackers with peanut butter, sometimes with veggie sticks (potato based) ?Snack: fruit/ crackers ?Supper: 3/28 chicken, potato salad, green beans; pork chop, rice, green beans ?Snack: low sugar ice cream ?Beverages: water, occasional ginger ale ? ?Intervention:  ? ?Nutrition Care Education: ? ?Basic nutrition: reviewed appropriate nutrient balance; appropriate meal and snack schedule; general nutrition guidelines    ?Weight control: reviewed progress since previous visit; portion control particularly with starches, sweets including low sugar versions ?Diabetes: appropriate carb intake and balance -- advised one starchy food per meal along with protein and nonstarchy veg, 0-1 fruit serving; discussed other low sugar options for frozen dessert and importance of portion control even with low sugar foods, and discussed appropriate portion sizes, encouraged use of small bowl/ cup for dessert, or using small wafer cone for frozen dessert ? ? ?Nutritional Diagnosis:  [place]-2.2 Altered nutrition-related laboratory As related to type 2 diabetes.  As evidenced by  elevated HbA1C. ?[place]-3.3 Overweight/obesity As related to limited physical activity, history of excess calories.  As evidenced by patient with current BMI of 47. ? ? ?Learner/ who was taught:  ?Patient  ?Family member: sister Shatonia Hoots ? ? ?Level of understanding: ?Verbalizes/ demonstrates competency (with simple concepts) ? ? ?Demonstrated degree of understanding via:   Teach back ?Learning barriers: ?None ? ?Willingness to learn/ readiness for change: ?Eager, change in progress ? ?Monitoring and Evaluation:  Dietary intake, exercise, BG control, and body weight ?     follow up:  08/22/21 at 1:15pm   ?

## 2021-06-04 ENCOUNTER — Encounter: Payer: Medicare Other | Admitting: Occupational Therapy

## 2021-06-06 ENCOUNTER — Ambulatory Visit: Payer: Medicare Other | Attending: Student | Admitting: Occupational Therapy

## 2021-06-06 DIAGNOSIS — M25532 Pain in left wrist: Secondary | ICD-10-CM | POA: Diagnosis present

## 2021-06-06 DIAGNOSIS — M79642 Pain in left hand: Secondary | ICD-10-CM | POA: Insufficient documentation

## 2021-06-06 DIAGNOSIS — M6281 Muscle weakness (generalized): Secondary | ICD-10-CM | POA: Insufficient documentation

## 2021-06-06 DIAGNOSIS — L905 Scar conditions and fibrosis of skin: Secondary | ICD-10-CM | POA: Insufficient documentation

## 2021-06-06 NOTE — Therapy (Signed)
[place] [facility] ?[address] ?[place], [place], [address] ?Phone: [phone]   Fax:  [phone] ? ?Occupational Therapy Treatment ? ?Patient Details  ?Name: Christine Lawson ?MRN: [id number] ?Date of Birth: Sep 07, 1960 ?Referring Provider (OT): DR Poggi ? ? ?Encounter Date: 06/06/2021 ? ? OT End of Session - 06/06/21 1511   ? ? Visit Number 11   ? Number of Visits 14   ? Date for OT Re-Evaluation 06/11/21   ? OT Start Time 1318   ? OT Stop Time 1356   ? OT Time Calculation (min) 38 min   ? Activity Tolerance Patient tolerated treatment well   ? Behavior During Therapy [facility] for tasks assessed/performed   ? ?  ?  ? ?  ? ? ?Past Medical History:  ?Diagnosis Date  ? Diabetes mellitus without complication ([place])   ? Endometrial adenocarcinoma ([place])   ? GERD (gastroesophageal reflux disease)   ? Hematochezia   ? Hyperlipidemia   ? Hypertension   ? IDA (iron deficiency anemia)   ? Inguinal lymphadenopathy 07/13/2015  ? Irritable bowel syndrome   ? Obesity   ? Osteoarthritis   ? Sleep apnea   ? ? ?Past Surgical History:  ?Procedure Laterality Date  ? ABDOMINAL HYSTERECTOMY  feb 2013  ? CARPAL TUNNEL RELEASE Left 03/20/2021  ? Procedure: CARPAL TUNNEL RELEASE ENDOSCOPIC;  Surgeon: Corky Mull, MD;  Location: [facility] ORS;  Service: Orthopedics;  Laterality: Left;  ? COLONOSCOPY WITH PROPOFOL N/A 08/02/2015  ? Procedure: COLONOSCOPY WITH PROPOFOL;  Surgeon: Manya Silvas, MD;  Location: [facility] ENDOSCOPY;  Service: Endoscopy;  Laterality: N/A;  ? ESOPHAGOGASTRODUODENOSCOPY (EGD) WITH PROPOFOL N/A 08/02/2015  ? Procedure: ESOPHAGOGASTRODUODENOSCOPY (EGD) WITH PROPOFOL;  Surgeon: Manya Silvas, MD;  Location: [facility] ENDOSCOPY;  Service: Endoscopy;  Laterality: N/A;  ? ? ?There were no vitals filed for this visit. ? ? Subjective Assessment - 06/06/21 1509   ? ? Subjective  I did This week and some of my food.  And I am doing about 25% of my bathing and dressing.  I arm or hand  still gets tired if I hold my iPad too long or carrying a plate of food.  Numbness getting better in my fingers   ? Pertinent History Pt was seen at [facility] on 04/01/21 - for first postop appointment following a endoscopic left carpal tunnel release. Surgery was performed by Dr. Roland Rack on 03/20/2021. Overall the patient feels that she is doing well. She does report a 6 out of 10 pain score in the left wrist and hand but she is only taking Tylenol as needed for discomfort. The patient denies any falls or trauma affecting left wrist or hand since surgery. She denies any signs of infection at home such as fevers or any drainage from the left wrist incision site. The patient has not been doing much lifting pushing or pulling, she does report continued swelling in the left hand and fingers.Refer to OT   ? Patient Stated Goals Want my L hand swelling and  pain better so I can use it around the house   ? Currently in Pain? No/denies   ? ?  ?  ? ?  ? ? ? ? ? [facility] OT Assessment - 06/06/21 0001   ? ?  ? Strength  ? Right Hand Grip (lbs) 41   ? Right Hand Lateral Pinch 13 lbs   ? Right Hand 3 Point Pinch 11 lbs   ? Left  Hand Grip (lbs) 45   ? Left Hand Lateral Pinch 14 lbs   ? Left Hand 3 Point Pinch 11 lbs   ? ?  ?  ? ?  ? ? ? ? ? ?Wrist extention still impaired  but not limiting pt except pushing out of chair ?  ? one area of thick scar tissue but not tender or pain ?cont with scar massage at home - 1 cm adhesion - limiting weightbearing through wrist  ?Able to carry 8 lbs this date  ?   ? AAROM for wrist flexion, ext- pt to focus on composite extnetion table slides 20 reps- pt report they don't have table - pt to do on counter  ?20 reps done pt to include thumb ?  ?Continue green medium putty for gripping, lat and 3 point to- 12 reps x 2 - no pain  ?To do 2 x day  ?Continue 5 pounds  for elbow  ?And increase to 3 lbs for wrist in all planes - no issues  ? ?  ?Simulated with patient activities in the gym l-pushing and pulling  heavy door, pushing off a chair, carry 8 pounds with no pai ?Pt report she is starting to cut with knife and fork-  provided built-up grip that helped for discomfort in past. ?Able to open and close large jar and open package ?Simulated with patient carrying plate and holding iPad patient report wrist giving out patient able to carry 2 pounds and simulated plate ?Encourage for patient to continue to increase use of left hand possible discharge next session ? ? ? ? ? ? ? ? ? ? ? ? OT Education - 06/06/21 1511   ? ? Education Details progress and changes to HEP   ? Person(s) Educated Patient;Other (comment)   ? Methods Explanation;Demonstration;Tactile cues;Verbal cues;Handout   ? Comprehension Verbal cues required;Returned demonstration;Verbalized understanding   ? ?  ?  ? ?  ? ? ? OT Short Term Goals - 06/06/21 1516   ? ?  ? OT SHORT TERM GOAL #1  ? Title Pain/numbness  on L wrist/ hand  decrease to less than 2/10 with use during day   ? Status Achieved   ?  ? OT SHORT TERM GOAL #2  ? Title Pt to be ind in HEP to decrease pain , increase AROM in wrist and digits to [facility] to use hand in eating , bathing   ? Status Achieved   ? ?  ?  ? ?  ? ? ? ? OT Long Term Goals - 06/06/21 1517   ? ?  ? OT LONG TERM GOAL #1  ? Title Pt L grip  strength increase to more than 50% compare to R hand to use L hand  in bathing , turn doorknob without increase symptoms   ? Status Achieved   ?  ? OT LONG TERM GOAL #2  ? Title Strength and AROM increase in L hand and wrist for pt to pull and push door, carry more than 5 lbs , cut food and hold dring without increase symptoms   ? Baseline no pain - able to push and pull door -not pushing up from chair yet - cut some food -assess carrying plate and drink next session   ? ?  ?  ? ?  ? ? ? ? ? ? ? ? Plan - 06/06/21 1511   ? ? Clinical Impression Statement Pt present at OT about 11 wks s/p L arthroscopic CTR  by Dr Roland Rack on 03/20/21 -patient made great progress from start of care and scar tissue,  range of motion and grip and prehension strength.  This date patient show increased grip and prehension within normal limits compared to right hand.  With no pain.  Patient able to carry 7-8 pounds with no pain.  Simulated with patient pushing and pulling heavy door, carrying plate with 2 pounds and holding clipboard with weight simulating iPad-had no pain.  Patient was able to do elbow exercises with 5 pounds with no pain and wrist with 3 pounds in all planes with no pain.  Semmes-Weinstein sensation test was done patient showed normal sensation in all digits.  Patient to continue with medium green putty  for grip and prenhension.  Patient still has 1 area of scar tissue that is thick and hard but not painful limiting her wrist extension.  Limiting her functional use of L hand in ADL's and IADl's patient will follow up 1 more session and possibly discharge next session   ? OT Occupational Profile and History Problem Focused Assessment - Including review of records relating to presenting problem   ? Occupational performance deficits (Please refer to evaluation for details): ADL's;IADL's;Play;Leisure;Social Participation   ? Body Structure / Function / Physical Skills ADL;Strength;Decreased knowledge of use of DME;Decreased knowledge of precautions;Flexibility;ROM;IADL;Edema;UE functional use;Pain;Scar mobility   ? Rehab Potential Good   ? Clinical Decision Making Limited treatment options, no task modification necessary   ? Comorbidities Affecting Occupational Performance: None   ? Modification or Assistance to Complete Evaluation  No modification of tasks or assist necessary to complete eval   ? OT Frequency 1x / week   ? OT Duration 2 weeks   ? OT Treatment/Interventions Self-care/ADL training;Ultrasound;Contrast Bath;Therapeutic exercise;Manual Therapy;Patient/family education;Passive range of motion;DME and/or AE instruction;Splinting;Fluidtherapy;Paraffin;Scar mobilization   ? Consulted and Agree with Plan of  Care Patient   ? ?  ?  ? ?  ? ? ?Patient will benefit from skilled therapeutic intervention in order to improve the following deficits and impairments:   ?Body Structure / Function / Physical Skills: ADL,

## 2021-06-10 ENCOUNTER — Encounter: Payer: Self-pay | Admitting: Internal Medicine

## 2021-06-11 ENCOUNTER — Ambulatory Visit: Payer: Medicare Other | Admitting: Occupational Therapy

## 2021-06-11 ENCOUNTER — Encounter: Payer: Self-pay | Admitting: Occupational Therapy

## 2021-06-11 DIAGNOSIS — M6281 Muscle weakness (generalized): Secondary | ICD-10-CM

## 2021-06-11 DIAGNOSIS — L905 Scar conditions and fibrosis of skin: Secondary | ICD-10-CM

## 2021-06-11 DIAGNOSIS — M25532 Pain in left wrist: Secondary | ICD-10-CM

## 2021-06-11 DIAGNOSIS — M79642 Pain in left hand: Secondary | ICD-10-CM

## 2021-06-11 NOTE — Therapy (Signed)
[place] [facility] [facility] ?[address] ?[place], [place], [address] ?Phone: [phone]   Fax:  [phone] ? ?Occupational Therapy Treatment/discharge ? ?Patient Details  ?Name: Christine Lawson ?MRN: [id number] ?Date of Birth: 30-Jul-1960 ?Referring Provider (OT): DR Poggi ? ? ?Encounter Date: 06/11/2021 ? ? OT End of Session - 06/11/21 1327   ? ? Visit Number 12   ? Number of Visits 14   ? Date for OT Re-Evaluation 06/11/21   ? OT Start Time 1317   ? OT Stop Time 1348   ? OT Time Calculation (min) 31 min   ? Activity Tolerance Patient tolerated treatment well   ? Behavior During Therapy [facility] for tasks assessed/performed   ? ?  ?  ? ?  ? ? ?Past Medical History:  ?Diagnosis Date  ? Diabetes mellitus without complication ([place])   ? Endometrial adenocarcinoma ([place])   ? GERD (gastroesophageal reflux disease)   ? Hematochezia   ? Hyperlipidemia   ? Hypertension   ? IDA (iron deficiency anemia)   ? Inguinal lymphadenopathy 07/13/2015  ? Irritable bowel syndrome   ? Obesity   ? Osteoarthritis   ? Sleep apnea   ? ? ?Past Surgical History:  ?Procedure Laterality Date  ? ABDOMINAL HYSTERECTOMY  feb 2013  ? CARPAL TUNNEL RELEASE Left 03/20/2021  ? Procedure: CARPAL TUNNEL RELEASE ENDOSCOPIC;  Surgeon: Corky Mull, MD;  Location: [facility] ORS;  Service: Orthopedics;  Laterality: Left;  ? COLONOSCOPY WITH PROPOFOL N/A 08/02/2015  ? Procedure: COLONOSCOPY WITH PROPOFOL;  Surgeon: Manya Silvas, MD;  Location: [facility] ENDOSCOPY;  Service: Endoscopy;  Laterality: N/A;  ? ESOPHAGOGASTRODUODENOSCOPY (EGD) WITH PROPOFOL N/A 08/02/2015  ? Procedure: ESOPHAGOGASTRODUODENOSCOPY (EGD) WITH PROPOFOL;  Surgeon: Manya Silvas, MD;  Location: [facility] ENDOSCOPY;  Service: Endoscopy;  Laterality: N/A;  ? ? ?There were no vitals filed for this visit. ? ? Subjective Assessment - 06/11/21 1320   ? ? Subjective  I just want to tell you that I did carry my plate and I cut my own food.  I am still doing the  weight for my wrist and I putty.  [facility] still helps me with the scar massage.   ? Pertinent History Pt was seen at [facility] on 04/01/21 - for first postop appointment following a endoscopic left carpal tunnel release. Surgery was performed by Dr. Roland Rack on 03/20/2021. Overall the patient feels that she is doing well. She does report a 6 out of 10 pain score in the left wrist and hand but she is only taking Tylenol as needed for discomfort. The patient denies any falls or trauma affecting left wrist or hand since surgery. She denies any signs of infection at home such as fevers or any drainage from the left wrist incision site. The patient has not been doing much lifting pushing or pulling, she does report continued swelling in the left hand and fingers.Refer to OT   ? Patient Stated Goals Want my L hand swelling and  pain better so I can use it around the house   ? Currently in Pain? No/denies   ? ?  ?  ? ?  ? ? ? ? ? [facility] OT Assessment - 06/11/21 0001   ? ?  ? AROM  ? Left Wrist Extension 65 Degrees   ? Left Wrist Flexion 85 Degrees   ?  ? Strength  ? Right Hand Grip (lbs) 41   ? Right Hand Lateral Pinch 13 lbs   ?  Right Hand 3 Point Pinch 11 lbs   ? Left Hand Grip (lbs) 45   ? Left Hand Lateral Pinch 14 lbs   ? Left Hand 3 Point Pinch 12 lbs   ? ?  ?  ? ?  ? ? ? ?  ?  ? One area of thick scar tissue but not tender or pain ?cont with scar massage at home - 1 cm adhesion - limiting weightbearing through wrist  ?Able to carry 8 lbs this date  ?   ? AAROM for wrist flexion, ext- pt to focus on composite extention table slides 20 reps- 20 reps done pt to include thumb ?Pt cannot do wall pushups because of shoulder issues ? But able to push up from chair this date  ?  ?Continue green medium putty for gripping, lat and 3 point to- 12 reps x 2 - no pain  ?To do 2 x day  ?Continue 5 pounds  for elbow  ?And increase to 3 lbs for wrist in all planes - no issues  ?  ?  ?Simulated with patient activities in the gym l-pushing and  pulling heavy door, pushing off a chair, carry 8 pounds with no pain - had some pain with 10lbs ?Pt report she did cut with knife and fork-  provided built-up grip that helped for discomfort in past. ?Able to open and close large jar and open package ?Patient reports she was able to carry her plate this past weekend but arm still gives out holding her iPad ? ?  ? ? ? ? ? ? ? ? ? ? ? ? ? ? ? OT Education - 06/11/21 1327   ? ? Education Details progress and changes to HEP   ? Person(s) Educated Patient   ? Methods Explanation;Demonstration;Tactile cues;Verbal cues;Handout   ? Comprehension Verbal cues required;Returned demonstration;Verbalized understanding   ? ?  ?  ? ?  ? ? ? OT Short Term Goals - 06/06/21 1516   ? ?  ? OT SHORT TERM GOAL #1  ? Title Pain/numbness  on L wrist/ hand  decrease to less than 2/10 with use during day   ? Status Achieved   ?  ? OT SHORT TERM GOAL #2  ? Title Pt to be ind in HEP to decrease pain , increase AROM in wrist and digits to [facility] to use hand in eating , bathing   ? Status Achieved   ? ?  ?  ? ?  ? ? ? ? OT Long Term Goals - 06/11/21 1352   ? ?  ? OT LONG TERM GOAL #1  ? Title Pt L grip  strength increase to more than 50% compare to R hand to use L hand  in bathing , turn doorknob without increase symptoms   ? Status Achieved   ?  ? OT LONG TERM GOAL #2  ? Title Strength and AROM increase in L hand and wrist for pt to pull and push door, carry more than 5 lbs , cut food and hold dring without increase symptoms   ? Status Achieved   ? ?  ?  ? ?  ? ? ? ? ? ? ? ? Plan - 06/11/21 1350   ? ? Clinical Impression Statement Pt present at OT about 11 wks s/p L arthroscopic CTR  by Dr Roland Rack on 03/20/21 -patient made great progress from start of care and scar tissue, range of motion and grip and prehension strength.  The last  2 sessions patient showed increased grip and prehension within normal limits compared to right hand.  With no pain.  Patient able to carry 8 pounds with no pain.   Simulated last time with patient pushing and pulling heavy door, carrying plate with 2 pounds and holding clipboard with weight simulating iPad-had no pain.  This date patient reports that she did cut her own food carry her plate at home encourage for her to increase her functional use of left hand in ADLs and IADLs at home.  Patient was able to do elbow exercises with 5 pounds with no pain and wrist with 3 pounds in all planes with no pain.  Semmes-Weinstein sensation test showed normal sensation in all digits.  Patient to continue with medium green putty  for grip and prenhension.  Patient made great progress and met all goals patient at this time discharged from OT services to continue with home program and can follow-up with me as needed.   ? OT Occupational Profile and History Problem Focused Assessment - Including review of records relating to presenting problem   ? Occupational performance deficits (Please refer to evaluation for details): ADL's;IADL's;Play;Leisure;Social Participation   ? Body Structure / Function / Physical Skills ADL;Strength;Decreased knowledge of use of DME;Decreased knowledge of precautions;Flexibility;ROM;IADL;Edema;UE functional use;Pain;Scar mobility   ? Rehab Potential Good   ? Clinical Decision Making Limited treatment options, no task modification necessary   ? Comorbidities Affecting Occupational Performance: None   ? Modification or Assistance to Complete Evaluation  No modification of tasks or assist necessary to complete eval   ? Consulted and Agree with Plan of Care Patient   ? ?  ?  ? ?  ? ? ?Patient will benefit from skilled therapeutic intervention in order to improve the following deficits and impairments:   ?Body Structure / Function / Physical Skills: ADL, Strength, Decreased knowledge of use of DME, Decreased knowledge of precautions, Flexibility, ROM, IADL, Edema, UE functional use, Pain, Scar mobility ?  ?  ? ? ?Visit Diagnosis: ?Pain in left hand ? ?Scar  tissue ? ?Pain in left wrist ? ?Muscle weakness (generalized) ? ? ? ?Problem List ?Patient Active Problem List  ? Diagnosis Date Noted  ? Lymphedema 01/17/2016  ? 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 Pain in limb

## 2021-06-13 ENCOUNTER — Encounter: Payer: Medicare Other | Admitting: Occupational Therapy

## 2021-06-13 ENCOUNTER — Ambulatory Visit (LOCAL_COMMUNITY_HEALTH_CENTER): Payer: Medicare Other

## 2021-06-13 DIAGNOSIS — Z719 Counseling, unspecified: Secondary | ICD-10-CM

## 2021-06-13 DIAGNOSIS — Z23 Encounter for immunization: Secondary | ICD-10-CM | POA: Diagnosis not present

## 2021-06-13 NOTE — Progress Notes (Signed)
?  Are you feeling sick today? No ? ? ?Have you ever received a dose of COVID-19 Vaccine? [address], [place], [place], [place], Other) Yes ? ?If yes, which vaccine and how many doses?   Pfizer Primary series 2 doses and Conservation officer, nature ? ? ?Did you bring the vaccination record card or other documentation?  Yes ? ? ?Do you have a health condition or are undergoing treatment that makes you moderately or severely immunocompromised? This would include, but not be limited to: cancer, HIV, organ transplant, immunosuppressive therapy/high-dose corticosteroids, or moderate/severe primary immunodeficiency.  No ? ?Have you received COVID-19 vaccine before or during hematopoietic cell transplant (HCT) or CAR-T-cell therapies? No ? ?Have you ever had an allergic reaction to: (This would include a severe allergic reaction or a reaction that caused hives, swelling, or respiratory distress, including wheezing.) A component of a COVID-19 vaccine or a previous dose of COVID-19 vaccine? No ? ? ?Have you ever had an allergic reaction to another vaccine (other thanCOVID-19 vaccine) or an injectable medication? (This would include a severe allergic reaction or a reaction that caused hives, swelling, or respiratory distress, including wheezing.)   No ?  ?Do you have a history of any of the following: ? ?Myocarditis or Pericarditis No ?Thrombosis with thrombocytopenia syndrome (TTS) No ?Multisystem Inflammatory Syndrome (MIS-C or MIS-A)? No ?Immune-mediate syndrome defined by thrombosis and thrombocytopenia, such as heparin--induced thrombocytopenia (HIT)  No ?Guillain-Barr? Syndrome (GBS) No ?COVID-19 disease within the past 3 months? No ?Vaccinated with monkeypox vaccine in the last 4 weeks? No ? ?[facility] and COVID card updated and given to patient. Ranae Palms, RN ? ?

## 2021-07-17 ENCOUNTER — Ambulatory Visit: Payer: Medicare Other | Admitting: Physical Therapy

## 2021-07-19 MED FILL — Ferumoxytol Inj 510 MG/17ML (30 MG/ML) (Elemental Fe): INTRAVENOUS | Qty: 17 | Status: AC

## 2021-07-22 ENCOUNTER — Inpatient Hospital Stay: Payer: Medicare Other | Attending: Internal Medicine

## 2021-07-22 ENCOUNTER — Inpatient Hospital Stay (HOSPITAL_BASED_OUTPATIENT_CLINIC_OR_DEPARTMENT_OTHER): Payer: Medicare Other | Admitting: Internal Medicine

## 2021-07-22 ENCOUNTER — Encounter: Payer: Self-pay | Admitting: Internal Medicine

## 2021-07-22 ENCOUNTER — Inpatient Hospital Stay: Payer: Medicare Other

## 2021-07-22 DIAGNOSIS — K648 Other hemorrhoids: Secondary | ICD-10-CM | POA: Insufficient documentation

## 2021-07-22 DIAGNOSIS — D509 Iron deficiency anemia, unspecified: Secondary | ICD-10-CM | POA: Diagnosis present

## 2021-07-22 DIAGNOSIS — Z833 Family history of diabetes mellitus: Secondary | ICD-10-CM | POA: Insufficient documentation

## 2021-07-22 DIAGNOSIS — E119 Type 2 diabetes mellitus without complications: Secondary | ICD-10-CM | POA: Diagnosis not present

## 2021-07-22 DIAGNOSIS — M1990 Unspecified osteoarthritis, unspecified site: Secondary | ICD-10-CM | POA: Diagnosis not present

## 2021-07-22 DIAGNOSIS — Z79899 Other long term (current) drug therapy: Secondary | ICD-10-CM | POA: Insufficient documentation

## 2021-07-22 DIAGNOSIS — G4730 Sleep apnea, unspecified: Secondary | ICD-10-CM | POA: Diagnosis not present

## 2021-07-22 DIAGNOSIS — Z882 Allergy status to sulfonamides status: Secondary | ICD-10-CM | POA: Diagnosis not present

## 2021-07-22 DIAGNOSIS — D75839 Thrombocytosis, unspecified: Secondary | ICD-10-CM | POA: Diagnosis not present

## 2021-07-22 DIAGNOSIS — Z6841 Body Mass Index (BMI) 40.0 and over, adult: Secondary | ICD-10-CM | POA: Insufficient documentation

## 2021-07-22 DIAGNOSIS — D500 Iron deficiency anemia secondary to blood loss (chronic): Secondary | ICD-10-CM

## 2021-07-22 DIAGNOSIS — C541 Malignant neoplasm of endometrium: Secondary | ICD-10-CM | POA: Insufficient documentation

## 2021-07-22 DIAGNOSIS — I10 Essential (primary) hypertension: Secondary | ICD-10-CM | POA: Diagnosis not present

## 2021-07-22 LAB — IRON AND TIBC
Iron: 56 ug/dL (ref 28–170)
Saturation Ratios: 16 % (ref 10.4–31.8)
TIBC: 342 ug/dL (ref 250–450)
UIBC: 286 ug/dL

## 2021-07-22 LAB — CBC WITH DIFFERENTIAL/PLATELET
Abs Immature Granulocytes: 0.06 10*3/uL (ref 0.00–0.07)
Basophils Absolute: 0.1 10*3/uL (ref 0.0–0.1)
Basophils Relative: 1 %
Eosinophils Absolute: 0.3 10*3/uL (ref 0.0–0.5)
Eosinophils Relative: 2 %
HCT: 39.4 % (ref 36.0–46.0)
Hemoglobin: 12.3 g/dL (ref 12.0–15.0)
Immature Granulocytes: 1 %
Lymphocytes Relative: 17 %
Lymphs Abs: 2 10*3/uL (ref 0.7–4.0)
MCH: 25.9 pg — ABNORMAL LOW (ref 26.0–34.0)
MCHC: 31.2 g/dL (ref 30.0–36.0)
MCV: 82.9 fL (ref 80.0–100.0)
Monocytes Absolute: 0.8 10*3/uL (ref 0.1–1.0)
Monocytes Relative: 6 %
Neutro Abs: 8.7 10*3/uL — ABNORMAL HIGH (ref 1.7–7.7)
Neutrophils Relative %: 73 %
Platelets: 455 10*3/uL — ABNORMAL HIGH (ref 150–400)
RBC: 4.75 MIL/uL (ref 3.87–5.11)
RDW: 13.7 % (ref 11.5–15.5)
WBC: 11.9 10*3/uL — ABNORMAL HIGH (ref 4.0–10.5)
nRBC: 0 % (ref 0.0–0.2)

## 2021-07-22 LAB — BASIC METABOLIC PANEL
Anion gap: 7 (ref 5–15)
BUN: 18 mg/dL (ref 6–20)
CO2: 32 mmol/L (ref 22–32)
Calcium: 8.7 mg/dL — ABNORMAL LOW (ref 8.9–10.3)
Chloride: 99 mmol/L (ref 98–111)
Creatinine, Ser: 0.72 mg/dL (ref 0.44–1.00)
GFR, Estimated: >60 mL/min (ref >60)
Glucose, Bld: 179 mg/dL — ABNORMAL HIGH (ref 70–99)
Potassium: 4.2 mmol/L (ref 3.5–5.1)
Sodium: 138 mmol/L (ref 135–145)

## 2021-07-22 LAB — FERRITIN: Ferritin: 180 ng/mL (ref 11–307)

## 2021-07-22 NOTE — Assessment & Plan Note (Addendum)
Iron deficiency anemia-question etiology. NOV 2022- Today 12.3.  Iron studies pending.  Continue by mouth iron PO one a day.  No IV iron today.   # History of endometrial cancer status post surgery-no adjuvant therapy; recent CT -NEG; contonue follow up with Gyn, Dr.Schermerhorn. STABLE  # Mild thrombocytosis: likely secondary- check MPN panel.  Monitor for now.    DISPOSITION: labs print out # NO infusion  #  follow-up in 1st week of dec 2023 with MD- iron studies-Ferritin/CBC BMP; possible ferrahem IV- Dr.B

## 2021-07-22 NOTE — Progress Notes (Signed)
Welcome OFFICE PROGRESS NOTE  Patient Care Team: Tracie Harrier, MD as PCP - General (Internal Medicine) Clent Jacks, RN as Registered Nurse Cammie Sickle, MD as Consulting Physician (Oncology)   Cancer Staging  No matching staging information was found for the patient.   Oncology History  Endometrial adenocarcinoma ([place])  07/13/2015 Initial Diagnosis   Endometrial adenocarcinoma ([place])     Anemia of iron deficiency unresponsive to oral iron therapy - got IV Venofer 1 g in Feb 2015, then on oral Ferrous sulfate 1 tablet daily. (labs on 03/28/13 had showed hemoglobin 10.0, MCV 71.3, WBC 10,800, ANC 7990, ALC 1640, platelet count 564, ferritin 27, serum iron 33, iron saturation low at 7%, TIBC 452. In June 2014 - hemoglobin 9.8, WBC 10,700, platelets 551, LFT unremarkable except alkaline phos of 120, creatinine 0.6). Stool Hemoccult was negative x2 in November 2014.  Colonoscopy July 2013 had shown internal hemorrhoids.  INTERVAL HISTORY: Accompanied by sister.  Ambulating with a cane.  Morbidly obese.  Christine Lawson 61 y.o.  female pleasant patient above history of iron deficiency anemia; also history of early stage endometrial cancer is here for follow-up.  Patient denies any nausea vomiting abdominal pain.  Denies any fatigue.  Patient denies any blood in stools or black-colored stools.  Patient is on iron pills once a day.  Review of Systems  Constitutional:  Negative for chills, diaphoresis, fever, malaise/fatigue and weight loss.  HENT:  Negative for nosebleeds and sore throat.   Eyes:  Negative for double vision.  Respiratory:  Negative for cough, hemoptysis, sputum production and wheezing.   Cardiovascular:  Negative for chest pain, palpitations, orthopnea and leg swelling.  Gastrointestinal:  Negative for abdominal pain, blood in stool, constipation, diarrhea, heartburn, melena, nausea and vomiting.  Genitourinary:  Negative for dysuria,  frequency and urgency.  Musculoskeletal:  Negative for back pain and joint pain.  Skin: Negative.  Negative for itching and rash.  Neurological:  Negative for dizziness, tingling, focal weakness, weakness and headaches.  Endo/Heme/Allergies:  Does not bruise/bleed easily.  Psychiatric/Behavioral:  Negative for depression. The patient is not nervous/anxious and does not have insomnia.      PAST MEDICAL HISTORY :  Past Medical History:  Diagnosis Date  . Diabetes mellitus without complication ([place])   . Endometrial adenocarcinoma ([place])   . GERD (gastroesophageal reflux disease)   . Hematochezia   . Hyperlipidemia   . Hypertension   . IDA (iron deficiency anemia)   . Inguinal lymphadenopathy 07/13/2015  . Irritable bowel syndrome   . Obesity   . Osteoarthritis   . Sleep apnea     PAST SURGICAL HISTORY :   Past Surgical History:  Procedure Laterality Date  . ABDOMINAL HYSTERECTOMY  feb 2013  . CARPAL TUNNEL RELEASE Left 03/20/2021   Procedure: CARPAL TUNNEL RELEASE ENDOSCOPIC;  Surgeon: Corky Mull, MD;  Location: [facility] ORS;  Service: Orthopedics;  Laterality: Left;  . COLONOSCOPY WITH PROPOFOL N/A 08/02/2015   Procedure: COLONOSCOPY WITH PROPOFOL;  Surgeon: Manya Silvas, MD;  Location: [facility] ENDOSCOPY;  Service: Endoscopy;  Laterality: N/A;  . ESOPHAGOGASTRODUODENOSCOPY (EGD) WITH PROPOFOL N/A 08/02/2015   Procedure: ESOPHAGOGASTRODUODENOSCOPY (EGD) WITH PROPOFOL;  Surgeon: Manya Silvas, MD;  Location: [facility] ENDOSCOPY;  Service: Endoscopy;  Laterality: N/A;    FAMILY HISTORY :   Family History  Problem Relation Age of Onset  . Diabetes Mother   . Diabetes Father   . Breast cancer Neg Hx     SOCIAL  HISTORY:   Social History   Tobacco Use  . Smoking status: Never  . Smokeless tobacco: Never  Substance Use Topics  . Alcohol use: No  . Drug use: No    ALLERGIES:  is allergic to solifenacin, contrast media [iodinated contrast media], dicyclomine, isovue [iopamidol],  and sulfa antibiotics.  MEDICATIONS:  Current Outpatient Medications  Medication Sig Dispense Refill  . amLODipine (NORVASC) 10 MG tablet Take 10 mg by mouth daily.    Marland Kitchen ascorbic acid (VITAMIN C) 500 MG tablet Take 500 mg by mouth daily.    Marland Kitchen aspirin 81 MG EC tablet Take 81 mg by mouth daily. Swallow whole.    . budesonide-formoterol (SYMBICORT) 80-4.5 MCG/ACT inhaler Inhale 2 puffs into the lungs 2 (two) times daily as needed (shortness of breath).    . cholecalciferol (VITAMIN D) 1000 UNITS tablet Take 1,000 Units by mouth daily.    . diclofenac sodium (VOLTAREN) 1 % GEL Apply 1 application topically 2 (two) times daily as needed for pain.    Marland Kitchen enalapril (VASOTEC) 5 MG tablet Take 5 mg by mouth daily.    . ferrous sulfate 325 (65 FE) MG tablet Take 325 mg by mouth 2 (two) times daily with a meal.     . HUMALOG KWIKPEN 100 UNIT/ML KwikPen Inject 26-60 Units into the skin 3 (three) times daily. Inject 26 units into the skin with breakfast and lunch and inject 60 units with supper    . insulin glargine, 2 Unit Dial, (TOUJEO MAX SOLOSTAR) 300 UNIT/ML Solostar Pen Inject 120-140 Units into the skin daily.    . montelukast (SINGULAIR) 10 MG tablet Take 10 mg by mouth daily.    . pantoprazole (PROTONIX) 40 MG tablet Take 40 mg by mouth daily.    Marland Kitchen Peppermint Oil (IBGARD PO) Take 2 tablets by mouth daily.    . rosuvastatin (CRESTOR) 5 MG tablet Take 5 mg by mouth daily.    . sucralfate (CARAFATE) 1 g tablet Take by mouth.    . vitamin B-12 (CYANOCOBALAMIN) 500 MCG tablet Take 500 mcg by mouth daily.    Marland Kitchen albuterol (PROVENTIL HFA;VENTOLIN HFA) 108 (90 Base) MCG/ACT inhaler Inhale 1 puff into the lungs every 6 (six) hours as needed for wheezing or shortness of breath. (Patient not taking: Reported on 07/22/2021)    . HYDROcodone-acetaminophen (NORCO) 5-325 MG tablet Take 1 tablet by mouth every 6 (six) hours as needed for moderate pain. MAXIMUM TOTAL ACETAMINOPHEN DOSE IS 4000 MG PER DAY (Patient not  taking: Reported on 07/22/2021) 20 tablet 0   No current facility-administered medications for this visit.    PHYSICAL EXAMINATION: ECOG PERFORMANCE STATUS: 0 - Asymptomatic  BP (!) 141/60   Pulse 88   Temp 98.7 F (37.1 C)   Resp 20   Wt 272 lb 9.6 oz (123.7 kg)   SpO2 98%   BMI 48.29 kg/m   Filed Weights   07/22/21 1322  Weight: 272 lb 9.6 oz (123.7 kg)    Physical Exam Constitutional:      Comments: Alone.  Walking with a cane.  Obese.  HENT:     Head: Normocephalic and atraumatic.     Mouth/Throat:     Pharynx: No oropharyngeal exudate.  Eyes:     Pupils: Pupils are equal, round, and reactive to light.  Cardiovascular:     Rate and Rhythm: Normal rate and regular rhythm.  Pulmonary:     Effort: No respiratory distress.     Breath sounds: No wheezing.  Abdominal:     General: Bowel sounds are normal. There is no distension.     Palpations: Abdomen is soft. There is no mass.     Tenderness: There is no abdominal tenderness. There is no guarding or rebound.  Musculoskeletal:        General: No tenderness. Normal range of motion.     Cervical back: Normal range of motion and neck supple.  Skin:    General: Skin is warm.  Neurological:     Mental Status: She is alert and oriented to person, place, and time.  Psychiatric:        Mood and Affect: Affect normal.    LABORATORY DATA:  I have reviewed the data as listed    Component Value Date/Time   NA 138 07/22/2021 1256   NA 141 06/03/2013 2039   K 4.2 07/22/2021 1256   K 3.7 06/03/2013 2039   CL 99 07/22/2021 1256   CL 106 06/03/2013 2039   CO2 32 07/22/2021 1256   CO2 30 06/03/2013 2039   GLUCOSE 179 (H) 07/22/2021 1256   GLUCOSE 59 (L) 06/03/2013 2039   BUN 18 07/22/2021 1256   BUN 6 (L) 06/03/2013 2039   CREATININE 0.72 07/22/2021 1256   CREATININE 0.59 (L) 06/03/2013 2039   CALCIUM 8.7 (L) 07/22/2021 1256   CALCIUM 8.8 06/03/2013 2039   PROT 7.9 11/17/2018 1313   PROT 8.8 (H) 06/03/2013 2039    ALBUMIN 3.6 11/17/2018 1313   ALBUMIN 3.6 06/03/2013 2039   AST 26 11/17/2018 1313   AST 38 (H) 06/03/2013 2039   ALT 24 11/17/2018 1313   ALT 44 06/03/2013 2039   ALKPHOS 118 11/17/2018 1313   ALKPHOS 134 (H) 06/03/2013 2039   BILITOT 0.7 11/17/2018 1313   BILITOT 0.4 06/03/2013 2039   GFRNONAA >60 07/22/2021 1256   GFRNONAA >60 06/03/2013 2039   GFRAA >60 07/05/2019 1239   GFRAA >60 06/03/2013 2039    No results found for: SPEP, UPEP  Lab Results  Component Value Date   WBC 11.9 (H) 07/22/2021   NEUTROABS 8.7 (H) 07/22/2021   HGB 12.3 07/22/2021   HCT 39.4 07/22/2021   MCV 82.9 07/22/2021   PLT 455 (H) 07/22/2021      Chemistry      Component Value Date/Time   NA 138 07/22/2021 1256   NA 141 06/03/2013 2039   K 4.2 07/22/2021 1256   K 3.7 06/03/2013 2039   CL 99 07/22/2021 1256   CL 106 06/03/2013 2039   CO2 32 07/22/2021 1256   CO2 30 06/03/2013 2039   BUN 18 07/22/2021 1256   BUN 6 (L) 06/03/2013 2039   CREATININE 0.72 07/22/2021 1256   CREATININE 0.59 (L) 06/03/2013 2039      Component Value Date/Time   CALCIUM 8.7 (L) 07/22/2021 1256   CALCIUM 8.8 06/03/2013 2039   ALKPHOS 118 11/17/2018 1313   ALKPHOS 134 (H) 06/03/2013 2039   AST 26 11/17/2018 1313   AST 38 (H) 06/03/2013 2039   ALT 24 11/17/2018 1313   ALT 44 06/03/2013 2039   BILITOT 0.7 11/17/2018 1313   BILITOT 0.4 06/03/2013 2039       RADIOGRAPHIC STUDIES: I have personally reviewed the radiological images as listed and agreed with the findings in the report. No results found.   ASSESSMENT & PLAN:  Iron deficiency anemia due to chronic blood loss Iron deficiency anemia-question etiology. NOV 2022- Today 12.3.  Iron studies pending.  Continue by mouth iron PO  one a day.  No IV iron today.   # History of endometrial cancer status post surgery-no adjuvant therapy; recent CT -NEG; contonue follow up with Gyn, Dr.Schermerhorn. STABLE  # Mild thrombocytosis: likely secondary- check MPN  panel.  Monitor for now.    DISPOSITION: labs print out # NO infusion  #  follow-up in 1st week of dec 2023 with MD- iron studies-Ferritin/CBC BMP; possible ferrahem IV- Dr.B   Orders Placed This Encounter  Procedures  . MPL mutation analysis    Standing Status:   Future    Standing Expiration Date:   07/22/2022  . Miscellaneous [facility] test (send-out)    Standing Status:   Future    Standing Expiration Date:   07/22/2022    Order Specific Question:   Test name / description:    Answer:   calreticulin - Test # [id number]  . JAK2 genotypr    Standing Status:   Future    Standing Expiration Date:   07/23/2022  . Iron and TIBC    Standing Status:   Future    Standing Expiration Date:   07/23/2022  . Ferritin    Standing Status:   Future    Standing Expiration Date:   07/23/2022  . CBC with Differential/Platelet    Standing Status:   Future    Standing Expiration Date:   07/23/2022  . Basic metabolic panel    Standing Status:   Future    Standing Expiration Date:   07/23/2022    All questions were answered. The patient knows to call the clinic with any problems, questions or concerns.      Cammie Sickle, MD 07/22/2021 2:15 PM

## 2021-07-24 ENCOUNTER — Encounter: Payer: Medicare Other | Admitting: Physical Therapy

## 2021-07-26 ENCOUNTER — Encounter: Payer: Medicare Other | Admitting: Physical Therapy

## 2021-07-31 ENCOUNTER — Ambulatory Visit: Payer: Medicare Other | Attending: Student | Admitting: Physical Therapy

## 2021-07-31 ENCOUNTER — Encounter: Payer: Medicare Other | Admitting: Physical Therapy

## 2021-07-31 ENCOUNTER — Encounter: Payer: Self-pay | Admitting: Physical Therapy

## 2021-07-31 DIAGNOSIS — M25512 Pain in left shoulder: Secondary | ICD-10-CM | POA: Diagnosis present

## 2021-07-31 DIAGNOSIS — M25612 Stiffness of left shoulder, not elsewhere classified: Secondary | ICD-10-CM | POA: Diagnosis present

## 2021-07-31 DIAGNOSIS — G8929 Other chronic pain: Secondary | ICD-10-CM | POA: Insufficient documentation

## 2021-07-31 NOTE — Therapy (Signed)
[facility] [address] [place], [address] Phone: [phone]   Fax:  [phone]  Physical Therapy Treatment  Patient Details  Name: Christine Lawson MRN: [id number] Date of Birth: October 06, 1960 No data recorded  Encounter Date: 07/31/2021   PT End of Session - 07/31/21 1314     Visit Number 1    Number of Visits 17    Date for PT Re-Evaluation 09/27/21    Authorization - Visit Number 1    Progress Note Due on Visit 10    PT Start Time 1300    PT Stop Time 3545    PT Time Calculation (min) 45 min    Activity Tolerance Patient tolerated treatment well    Behavior During Therapy [facility] for tasks assessed/performed             Past Medical History:  Diagnosis Date   Diabetes mellitus without complication ([place])    Endometrial adenocarcinoma ([place])    GERD (gastroesophageal reflux disease)    Hematochezia    Hyperlipidemia    Hypertension    IDA (iron deficiency anemia)    Inguinal lymphadenopathy 07/13/2015   Irritable bowel syndrome    Obesity    Osteoarthritis    Sleep apnea     Past Surgical History:  Procedure Laterality Date   ABDOMINAL HYSTERECTOMY  feb 2013   CARPAL TUNNEL RELEASE Left 03/20/2021   Procedure: CARPAL TUNNEL RELEASE ENDOSCOPIC;  Surgeon: Corky Mull, MD;  Location: [facility] ORS;  Service: Orthopedics;  Laterality: Left;   COLONOSCOPY WITH PROPOFOL N/A 08/02/2015   Procedure: COLONOSCOPY WITH PROPOFOL;  Surgeon: Manya Silvas, MD;  Location: [facility] ENDOSCOPY;  Service: Endoscopy;  Laterality: N/A;   ESOPHAGOGASTRODUODENOSCOPY (EGD) WITH PROPOFOL N/A 08/02/2015   Procedure: ESOPHAGOGASTRODUODENOSCOPY (EGD) WITH PROPOFOL;  Surgeon: Manya Silvas, MD;  Location: [facility] ENDOSCOPY;  Service: Endoscopy;  Laterality: N/A;    There were no vitals filed for this visit.   Subjective Assessment - 07/31/21 1304     Pertinent History Pt is a 61 year old female presenting with L shoulder  pain over several months. Recently d/c OT at this clinic for L wrist pain, linked to this shoulder pain. Patient reports anterior superior pain that is irriated by overhead motion, carrying her grocery bags, lifting. Pt reports shoulder pain is a constant and is always at least NPRS 4/10; worst 9/10. Current pain level is 6/10 in the shoulder, that is dull/achy and sharp when she tries to use it, denies numbness and tingling. Has some popping/clicking with overhead lifting. Pain is alieviated by rest and ice. Pt lives with twin sister in handicap enabled apartment, has a aid that helps with cooking/heavy chores 4-6hours a day. Enjoys reading her bible. Does not drive, uses [employer] transportation. Reports Xrays from April revealed frozen shoulder. Pt denies N/V, B&B changes, unexplained weight fluctuation, saddle paresthesia, fever, night sweats, or unrelenting night pain at this time.    Limitations Lifting;House hold activities    How long can you sit comfortably? not limited by shoulder    How long can you stand comfortably? not limited by shoulder    How long can you walk comfortably? not limited by shoulder    Diagnostic tests xray in April reported by patient    Patient Stated Goals decrease pain    Currently in Pain? Yes    Pain Score 6     Pain Location Shoulder    Pain Orientation Anterior;Left  Pain Descriptors / Indicators Aching;Dull;Sharp    Pain Type Chronic pain    Pain Onset More than a month ago    Pain Frequency Constant    Aggravating Factors  overhead reaching, lifting, carrying groceries    Pain Relieving Factors rest    Effect of Pain on Daily Activities unable to complete household ADLs    Multiple Pain Sites No               OBJECTIVE  MUSCULOSKELETAL: Tremor: Normal Bulk: Normal Tone: Normal  Cervical Screen AROM:  Flex WNL Ext 50% limited Rotation 25% limited  Elbow Screen Elbow AROM: lacking approx 10d of elbow ext bilat  Palpation Concordant pain  to palpation at distal supraspinatus near insertion Secondary pain to palpation of proximal bicep insertion Trigger points at pec minor, UT, and levator bilat  Observation: upper crossed  Strength R/L 4/4- Shoulder flexion (anterior deltoid/pec major/coracobrachialis, axillary n. (C5-6) and musculocutaneous n. (C5-7)) 4/4- Shoulder abduction (deltoid/supraspinatus, axillary/suprascapular n, C5) 4-/3+ Shoulder external rotation (infraspinatus/teres minor) 4/4-Shoulder internal rotation (subcapularis/lats/pec major) 5/5 Shoulder extension (posterior deltoid, lats, teres major, axillary/thoracodorsal n.) Within limited motion (below)  AROM R/L 135/114 Shoulder flexion 113/94 Shoulder abduction APLEYS C2/L occiput Shoulder external rotation APLEYS L3/L PSIS Shoulder internal rotation 54/45 Shoulder extension Thoracic ext to near neutral only 50% limited *Indicates pain, overpressure performed unless otherwise indicated  PROM R/L 132/124 Shoulder flexion 114/110 Shoulder abduction 78/72 Shoulder external rotation 70/70 Shoulder internal rotation 60/60 Shoulder extension *Indicates pain, overpressure performed unless otherwise indicated  Accessory Motions/Glides Glenohumeral: Guarded in all directions  Hypomobility throughout tspine to CPA   Muscle Length Testing Pectoralis Major: R: normal L: normal Pectoralis Minor: R: abnormal L: abnormal Biceps: R: abnormal L: abnormal  NEUROLOGICAL:  Sensation Grossly intact to light touch bilateral UE as determined by testing dermatomes C2-T2 Proprioception and hot/cold testing deferred on this date  SPECIAL TESTS  Rotator Cuff  Drop Arm Test: Negative Painful Arc (Pain from 60 to 120 degrees scaption): Positive Infraspinatus Muscle Test: Negative If all 3 tests positive, the probability of a full-thickness rotator cuff tear is 91%  Subacromial Impingement Hawkins-Kennedy: Positive Neer (Block scapula, PROM flexion):  Positive Painful Arc (Pain from 60 to 120 degrees scaption): Positive Empty Can: Positive External Rotation Resistance: Positive Horizontal Adduction: Negative Scapular Assist: Negative Positive Hawkins-Kennedy, Painful arc sign, Infraspinatus muscle test then +LR: 10.56 of some type of impingement present, 2/3 tests: +LR 5.06, -LR 0.17, Positive 3/5 Hawkins-Kennedy, neer, painful arc, empty can, and external rotation resistance then SN: .75 (.54-.96) SP: .74 (.61-.88) +LR: 2.93 (1.60-5.36) -LR: .34 (.14-.80)  Labral Tear Biceps Load II (120 elevation, full ER, 90 elbow flexion, full supination, resisted elbow flexion): Negative Crank (160 scaption, axial load with IR/ER): Negative Active Compression Test: Negative  Bicep Tendon Pathology Speed (shoulder flexion to 90, external rotation, full elbow extension, and forearm supination with resistance: Negative Yergason's (resisted shoulder ER and supination/biceps tendon pathology): Negative  Ther-Ex  PT reviewed the following HEP with patient with patient able to demonstrate a set of the following with min cuing for correction needed. PT educated patient on parameters of therex (how/when to inc/decrease intensity, frequency, rep/set range, stretch hold time, and purpose of therex) with verbalized understanding.   Access Code: [email or web address] Exercises - Seated Thoracic Lumbar Extension with Pectoralis Stretch  - 3 x daily - 7 x weekly - 12-20 reps - Seated Shoulder Flexion AAROM with Pulley Behind  - 3 x daily - 7 x weekly -  12-20 reps - Seated Shoulder Abduction AAROM with Pulley Behind  - 3 x daily - 7 x weekly - 12-20 reps - Shoulder External Rotation and Scapular Retraction with Resistance  - 1 x daily - 7 x weekly - 3 sets - 10 reps                                 PT Education - 07/31/21 1313     Education Details Patient was educated on diagnosis, anatomy and pathology involved, prognosis, role of PT, and was  given an HEP, demonstrating exercise with proper form following verbal and tactile cues, and was given a paper hand out to continue exercise at home. Pt was educated on and agreed to plan of care.    Person(s) Educated Patient    Methods Explanation;Demonstration;Verbal cues    Comprehension Verbal cues required;Returned demonstration;Verbalized understanding              PT Short Term Goals - 07/31/21 1849       PT SHORT TERM GOAL #1   Title Pt will be independent with HEP in order to improve strength and decrease pain in order to improve pain-free function in ADLs    Baseline 07/31/21 HEP given    Time 4    Period Weeks    Status New                      Plan - 07/31/21 1834     Clinical Impression Statement Pt is a 61 year old female familiar with this clinic from recent d/c from OT for L carpel tunnel pain, with concordant L shoulder pain. Signs and symptoms of R shoulder impingement with supraspinatus involvement. Impairments in decreased active and passive shoulder ROM, decreased rotator cuff and periscapular strength, abnormal posture, thoracic hypomobility, soft tissue tension/trigger points, and pain. Activity limitations in lifting, carrying, reaching, overhead reaching, pushing, and pulling; inhibiting full participation in ADLs. Pt will benefit from skilled PT to address aforementioned impairments to increase independence and QOL.    Personal Factors and Comorbidities Comorbidity 3+;Fitness;Past/Current Experience;Transportation;Time since onset of injury/illness/exacerbation;Age;Sex    Comorbidities HTN, arthritis, HLD, DM    Examination-Activity Limitations Lift;Carry;Reach Overhead   pushing and pulling   Examination-Participation Restrictions Laundry;Cleaning;Community Activity;Meal Prep    Stability/Clinical Decision Making Evolving/Moderate complexity    Clinical Decision Making Moderate    Rehab Potential Good    PT Frequency 2x / week    PT Duration 8  weeks    PT Treatment/Interventions Moist Heat;Iontophoresis '4mg'$ /ml Dexamethasone;Therapeutic activities;Neuromuscular re-education;Joint Manipulations;Dry needling;Spinal Manipulations;Passive range of motion;Manual techniques;Patient/family education;Therapeutic exercise;Balance training;Ultrasound;Cryotherapy;Traction;ADLs/Self Care Home Management;Electrical Stimulation;Functional mobility training    PT Next Visit Plan scapulohumeral rhythm/mobility    PT Home Exercise Plan pulleys flex/abd, thoracic ext hands behind head, pec stretch    Consulted and Agree with Plan of Care Patient             Patient will benefit from skilled therapeutic intervention in order to improve the following deficits and impairments:  Decreased activity tolerance, Decreased strength, Increased fascial restricitons, Impaired flexibility, Impaired UE functional use, Postural dysfunction, Pain, Decreased range of motion, Decreased endurance, Decreased mobility, Hypomobility, Increased muscle spasms, Improper body mechanics, Obesity  Visit Diagnosis: Chronic left shoulder pain  Stiffness of left shoulder, not elsewhere classified     Problem List Patient Active Problem List   Diagnosis Date Noted   Lymphedema 01/17/2016  Venous (peripheral) insufficiency 01/17/2016   Pain in limb 01/17/2016   Iron deficiency anemia due to chronic blood loss 01/09/2016   History of endometrial cancer 08/01/2015   Endometrial adenocarcinoma ([place]) 07/13/2015    Class: History of   Inguinal lymphadenopathy 07/13/2015   Long term current use of insulin ([place]) 12/12/2013   Microalbuminuria 12/12/2013   Diabetes ([place]) 07/28/2013   BP (high blood pressure) 07/28/2013   HLD (hyperlipidemia) 07/28/2013   Adiposity 07/28/2013   Obstructive apnea 07/28/2013   Arthritis, degenerative 07/28/2013   Apnea, sleep 07/28/2013   Thyroid nodule 07/28/2013   Durwin Reges DPT Durwin Reges, PT 07/31/2021, 6:52 PM  [facility] [address] [place], [address] Phone: [phone]   Fax:  [phone]  Name: Christine Lawson MRN: [id number] Date of Birth: [birth year]

## 2021-08-02 ENCOUNTER — Ambulatory Visit: Payer: Medicare Other | Admitting: Physical Therapy

## 2021-08-05 ENCOUNTER — Ambulatory Visit: Payer: Medicare Other | Attending: Student | Admitting: Physical Therapy

## 2021-08-05 ENCOUNTER — Encounter: Payer: Self-pay | Admitting: Physical Therapy

## 2021-08-05 DIAGNOSIS — G8929 Other chronic pain: Secondary | ICD-10-CM | POA: Insufficient documentation

## 2021-08-05 DIAGNOSIS — M25512 Pain in left shoulder: Secondary | ICD-10-CM | POA: Insufficient documentation

## 2021-08-05 DIAGNOSIS — M6281 Muscle weakness (generalized): Secondary | ICD-10-CM | POA: Diagnosis present

## 2021-08-05 DIAGNOSIS — M25612 Stiffness of left shoulder, not elsewhere classified: Secondary | ICD-10-CM | POA: Insufficient documentation

## 2021-08-05 NOTE — Therapy (Signed)
[place] [facility] [address] [place], [address] Phone: [phone]   Fax:  [phone]  Physical Therapy Treatment  Patient Details  Name: Christine Lawson MRN: [id number] Date of Birth: 10-01-1960 No data recorded  Encounter Date: 08/05/2021   PT End of Session - 08/05/21 1518     Visit Number 2    Number of Visits 17    Date for PT Re-Evaluation 09/27/21    Authorization - Visit Number 2    Progress Note Due on Visit 10    PT Start Time 1350    PT Stop Time 1430    PT Time Calculation (min) 40 min    Activity Tolerance Patient tolerated treatment well    Behavior During Therapy [facility] for tasks assessed/performed             Past Medical History:  Diagnosis Date   Diabetes mellitus without complication ([place])    Endometrial adenocarcinoma ([place])    GERD (gastroesophageal reflux disease)    Hematochezia    Hyperlipidemia    Hypertension    IDA (iron deficiency anemia)    Inguinal lymphadenopathy 07/13/2015   Irritable bowel syndrome    Obesity    Osteoarthritis    Sleep apnea     Past Surgical History:  Procedure Laterality Date   ABDOMINAL HYSTERECTOMY  feb 2013   CARPAL TUNNEL RELEASE Left 03/20/2021   Procedure: CARPAL TUNNEL RELEASE ENDOSCOPIC;  Surgeon: Corky Mull, MD;  Location: [facility] ORS;  Service: Orthopedics;  Laterality: Left;   COLONOSCOPY WITH PROPOFOL N/A 08/02/2015   Procedure: COLONOSCOPY WITH PROPOFOL;  Surgeon: Manya Silvas, MD;  Location: [facility] ENDOSCOPY;  Service: Endoscopy;  Laterality: N/A;   ESOPHAGOGASTRODUODENOSCOPY (EGD) WITH PROPOFOL N/A 08/02/2015   Procedure: ESOPHAGOGASTRODUODENOSCOPY (EGD) WITH PROPOFOL;  Surgeon: Manya Silvas, MD;  Location: [facility] ENDOSCOPY;  Service: Endoscopy;  Laterality: N/A;    There were no vitals filed for this visit.   Subjective Assessment - 08/05/21 1356     Subjective Pt reports min compliance with HEP due to her stomach being  messed up this weekend. Reports 6/10 pain today in shoulder    Pertinent History Pt is a 61 year old female presenting with L shoulder pain over several months. Recently d/c OT at this clinic for L wrist pain, linked to this shoulder pain. Patient reports anterior superior pain that is irriated by overhead motion, carrying her grocery bags, lifting. Pt reports shoulder pain is a constant and is always at least NPRS 4/10; worst 9/10. Current pain level is 6/10 in the shoulder, that is dull/achy and sharp when she tries to use it, denies numbness and tingling. Has some popping/clicking with overhead lifting. Pain is alieviated by rest and ice. Pt lives with twin sister in handicap enabled apartment, has a aid that helps with cooking/heavy chores 4-6hours a day. Enjoys reading her bible. Does not drive, uses [employer] transportation. Reports Xrays from April revealed frozen shoulder. Pt denies N/V, B&B changes, unexplained weight fluctuation, saddle paresthesia, fever, night sweats, or unrelenting night pain at this time.    Limitations Lifting;House hold activities    How long can you sit comfortably? not limited by shoulder    How long can you stand comfortably? not limited by shoulder    How long can you walk comfortably? not limited by shoulder    Diagnostic tests xray in April reported by patient    Patient Stated Goals decrease pain  Pain Onset More than a month ago               Ther-Ex AAROM pulleys fex x15; abd x15 with cuing for technique with good carry over Bilat ER yellow band 2x 12 with max cuing and demo needed initially with good carry over following Supine lat pullovers dowel with 7# AW 2x10 with cuing for technique throughout with good carry over Supine chest press with dowel 7# AW 2x10 with cuing for technique throughout with good carry over seated BUE from with dowel 7# AW 2x10 with cuing for eccentric control and posture with good carry over Squat with overhead press 2# in each  hand 2x 10 with max cuing for proper technique with good carry over Seated Thoracic Lumbar Extension with Pectoralis Stretch x12                      PT Education - 08/05/21 1518     Education Details therex form/technique, HEP review    Person(s) Educated Patient    Methods Explanation;Demonstration;Verbal cues    Comprehension Verbalized understanding;Returned demonstration;Verbal cues required              PT Short Term Goals - 07/31/21 1849       PT SHORT TERM GOAL #1   Title Pt will be independent with HEP in order to improve strength and decrease pain in order to improve pain-free function in ADLs    Baseline 07/31/21 HEP given    Time 4    Period Weeks    Status New               PT Long Term Goals - 07/31/21 1849       PT LONG TERM GOAL #1   Title Pt will decrease worst pain as reported on NPRS by at least 3 points in order to demonstrate clinically significant reduction in pain.    Baseline 07/31/21 9/10    Time 8    Period Weeks    Status New      PT LONG TERM GOAL #2   Title Patient will increase FOTO score to 79 to demonstrate predicted increase in functional mobility to complete ADLs    Baseline 07/31/21    Time 8    Period Weeks    Status New      PT LONG TERM GOAL #3   Title Patient will demonstrate gross 4/5 bilat shoulder and scapular retractor strength in order to complete heavy household ADLs    Baseline 07/31/21 see eval 07/31/21    Time 8    Period Weeks    Status New      PT LONG TERM GOAL #4   Title Pt will demonstrate bilat shoulder flex of at least 160d bilat in order to reach into overhead shelf    Baseline 07/31/21 R/L 135/114d    Time 8    Period Weeks    Status New                   Plan - 08/05/21 1518     Clinical Impression Statement PT initiated therex for increased mobility and periscapular strengthening with success. Patient is able to comply wit all cuing for proper technique of therex with  appropriate rest breaks. Pt requires multimodal cuing for proper technique of therex. PT will continue progression as able.    Personal Factors and Comorbidities Comorbidity 3+;Fitness;Past/Current Experience;Transportation;Time since onset of injury/illness/exacerbation;Age;Sex    Comorbidities HTN, arthritis,  HLD, DM    Examination-Activity Limitations Lift;Carry;Reach Overhead    Examination-Participation Restrictions Laundry;Cleaning;Community Activity;Meal Prep    Stability/Clinical Decision Making Evolving/Moderate complexity    Clinical Decision Making Moderate    Rehab Potential Good    PT Frequency 2x / week    PT Duration 8 weeks    PT Treatment/Interventions Moist Heat;Iontophoresis '4mg'$ /ml Dexamethasone;Therapeutic activities;Neuromuscular re-education;Joint Manipulations;Dry needling;Spinal Manipulations;Passive range of motion;Manual techniques;Patient/family education;Therapeutic exercise;Balance training;Ultrasound;Cryotherapy;Traction;ADLs/Self Care Home Management;Electrical Stimulation;Functional mobility training    PT Next Visit Plan scapulohumeral rhythm/mobility    PT Home Exercise Plan pulleys flex/abd, thoracic ext hands behind head, pec stretch    Consulted and Agree with Plan of Care Patient             Patient will benefit from skilled therapeutic intervention in order to improve the following deficits and impairments:  Decreased activity tolerance, Decreased strength, Increased fascial restricitons, Impaired flexibility, Impaired UE functional use, Postural dysfunction, Pain, Decreased range of motion, Decreased endurance, Decreased mobility, Hypomobility, Increased muscle spasms, Improper body mechanics, Obesity  Visit Diagnosis: Chronic left shoulder pain  Stiffness of left shoulder, not elsewhere classified     Problem List Patient Active Problem List   Diagnosis Date Noted   Lymphedema 01/17/2016   Venous (peripheral) insufficiency 01/17/2016   Pain  in limb 01/17/2016   Iron deficiency anemia due to chronic blood loss 01/09/2016   History of endometrial cancer 08/01/2015   Endometrial adenocarcinoma ([place]) 07/13/2015    Class: History of   Inguinal lymphadenopathy 07/13/2015   Long term current use of insulin ([place]) 12/12/2013   Microalbuminuria 12/12/2013   Diabetes ([place]) 07/28/2013   BP (high blood pressure) 07/28/2013   HLD (hyperlipidemia) 07/28/2013   Adiposity 07/28/2013   Obstructive apnea 07/28/2013   Arthritis, degenerative 07/28/2013   Apnea, sleep 07/28/2013   Thyroid nodule 07/28/2013   Durwin Reges DPT Durwin Reges, PT 08/05/2021, 3:22 PM  [place] [facility] [address], [place], [address] Phone: [phone]   Fax:  [phone]  Name: Christine Lawson MRN: [id number] Date of Birth: 01/31/1961

## 2021-08-08 ENCOUNTER — Encounter: Payer: Medicare Other | Admitting: Physical Therapy

## 2021-08-12 ENCOUNTER — Ambulatory Visit: Payer: Medicare Other | Admitting: Physical Therapy

## 2021-08-12 ENCOUNTER — Encounter: Payer: Self-pay | Admitting: Physical Therapy

## 2021-08-12 DIAGNOSIS — M25612 Stiffness of left shoulder, not elsewhere classified: Secondary | ICD-10-CM

## 2021-08-12 DIAGNOSIS — G8929 Other chronic pain: Secondary | ICD-10-CM

## 2021-08-12 DIAGNOSIS — M25512 Pain in left shoulder: Secondary | ICD-10-CM | POA: Diagnosis not present

## 2021-08-12 NOTE — Therapy (Signed)
[place] [address] [place], [address] Phone: [phone]   Fax:  [phone]  Physical Therapy Treatment  Patient Details  Name: Christine Lawson MRN: [id number] Date of Birth: 21-Feb-1961 No data recorded  Encounter Date: 08/12/2021   PT End of Session - 08/12/21 1439     Visit Number 3    Number of Visits 17    Date for PT Re-Evaluation 09/27/21    Authorization - Visit Number 3    Progress Note Due on Visit 10    PT Start Time 1300    PT Stop Time 1340    PT Time Calculation (min) 40 min    Activity Tolerance Patient tolerated treatment well    Behavior During Therapy [facility] for tasks assessed/performed             Past Medical History:  Diagnosis Date   Diabetes mellitus without complication ([place])    Endometrial adenocarcinoma ([place])    GERD (gastroesophageal reflux disease)    Hematochezia    Hyperlipidemia    Hypertension    IDA (iron deficiency anemia)    Inguinal lymphadenopathy 07/13/2015   Irritable bowel syndrome    Obesity    Osteoarthritis    Sleep apnea     Past Surgical History:  Procedure Laterality Date   ABDOMINAL HYSTERECTOMY  feb 2013   CARPAL TUNNEL RELEASE Left 03/20/2021   Procedure: CARPAL TUNNEL RELEASE ENDOSCOPIC;  Surgeon: Corky Mull, MD;  Location: [facility] ORS;  Service: Orthopedics;  Laterality: Left;   COLONOSCOPY WITH PROPOFOL N/A 08/02/2015   Procedure: COLONOSCOPY WITH PROPOFOL;  Surgeon: Manya Silvas, MD;  Location: [facility] ENDOSCOPY;  Service: Endoscopy;  Laterality: N/A;   ESOPHAGOGASTRODUODENOSCOPY (EGD) WITH PROPOFOL N/A 08/02/2015   Procedure: ESOPHAGOGASTRODUODENOSCOPY (EGD) WITH PROPOFOL;  Surgeon: Manya Silvas, MD;  Location: [facility] ENDOSCOPY;  Service: Endoscopy;  Laterality: N/A;    There were no vitals filed for this visit.   Subjective Assessment - 08/12/21 1309     Subjective Pt reports her motion is getting better. Compliance with HEP,  reports only 6/10 pain today.    Pertinent History Pt is a 61 year old female presenting with L shoulder pain over several months. Recently d/c OT at this clinic for L wrist pain, linked to this shoulder pain. Patient reports anterior superior pain that is irriated by overhead motion, carrying her grocery bags, lifting. Pt reports shoulder pain is a constant and is always at least NPRS 4/10; worst 9/10. Current pain level is 6/10 in the shoulder, that is dull/achy and sharp when she tries to use it, denies numbness and tingling. Has some popping/clicking with overhead lifting. Pain is alieviated by rest and ice. Pt lives with twin sister in handicap enabled apartment, has a aid that helps with cooking/heavy chores 4-6hours a day. Enjoys reading her bible. Does not drive, uses [employer] transportation. Reports Xrays from April revealed frozen shoulder. Pt denies N/V, B&B changes, unexplained weight fluctuation, saddle paresthesia, fever, night sweats, or unrelenting night pain at this time.    Limitations Lifting;House hold activities    How long can you sit comfortably? not limited by shoulder    How long can you stand comfortably? not limited by shoulder    How long can you walk comfortably? not limited by shoulder    Diagnostic tests xray in April reported by patient    Patient Stated Goals decrease pain    Pain Onset More than a month  ago              Ther-Ex AAROM pulleys fex x15; abd x15 with cuing for technique with good carry over [employer] on the wall x12  Neutral shoulder press 3# DB 2x 12 with good carry over following demo Scaption seated 2# DB 2x 10 with max cuing foc elbow ext OMEGA chest press 35# x10; 45# x5 with cuing for eccentric control OMEGA lat pulldown 45# 2x 10  Seated Thoracic Lumbar Extension with Pectoralis Stretch x12                          PT Education - 08/12/21 1310     Education Details therex form/technique    Person(s) Educated  Patient    Methods Explanation;Demonstration;Verbal cues    Comprehension Verbalized understanding;Returned demonstration;Verbal cues required              PT Short Term Goals - 07/31/21 1849       PT SHORT TERM GOAL #1   Title Pt will be independent with HEP in order to improve strength and decrease pain in order to improve pain-free function in ADLs    Baseline 07/31/21 HEP given    Time 4    Period Weeks    Status New               PT Long Term Goals - 07/31/21 1849       PT LONG TERM GOAL #1   Title Pt will decrease worst pain as reported on NPRS by at least 3 points in order to demonstrate clinically significant reduction in pain.    Baseline 07/31/21 9/10    Time 8    Period Weeks    Status New      PT LONG TERM GOAL #2   Title Patient will increase FOTO score to 79 to demonstrate predicted increase in functional mobility to complete ADLs    Baseline 07/31/21    Time 8    Period Weeks    Status New      PT LONG TERM GOAL #3   Title Patient will demonstrate gross 4/5 bilat shoulder and scapular retractor strength in order to complete heavy household ADLs    Baseline 07/31/21 see eval 07/31/21    Time 8    Period Weeks    Status New      PT LONG TERM GOAL #4   Title Pt will demonstrate bilat shoulder flex of at least 160d bilat in order to reach into overhead shelf    Baseline 07/31/21 R/L 135/114d    Time 8    Period Weeks    Status New                   Plan - 08/12/21 1443     Clinical Impression Statement PT continued therex progression for L shoulder mobility and strengthening with success. Pt is able to comply with all cuing for proper technique of therex with good motivation, no increased pain throughout. Pt is demonstrating near symmetrical shoulder mobility this session. Pt with good motivation, no increased pain throughout session.    Personal Factors and Comorbidities Comorbidity 3+;Fitness;Past/Current Experience;Transportation;Time  since onset of injury/illness/exacerbation;Age;Sex    Comorbidities HTN, arthritis, HLD, DM    Examination-Activity Limitations Lift;Carry;Reach Overhead    Examination-Participation Restrictions Laundry;Cleaning;Community Activity;Meal Prep    Stability/Clinical Decision Making Evolving/Moderate complexity    Clinical Decision Making Moderate    Rehab Potential Good  PT Frequency 2x / week    PT Duration 8 weeks    PT Treatment/Interventions Moist Heat;Iontophoresis '4mg'$ /ml Dexamethasone;Therapeutic activities;Neuromuscular re-education;Joint Manipulations;Dry needling;Spinal Manipulations;Passive range of motion;Manual techniques;Patient/family education;Therapeutic exercise;Balance training;Ultrasound;Cryotherapy;Traction;ADLs/Self Care Home Management;Electrical Stimulation;Functional mobility training    PT Next Visit Plan scapulohumeral rhythm/mobility    PT Home Exercise Plan pulleys flex/abd, thoracic ext hands behind head, pec stretch    Consulted and Agree with Plan of Care Patient             Patient will benefit from skilled therapeutic intervention in order to improve the following deficits and impairments:  Decreased activity tolerance, Decreased strength, Increased fascial restricitons, Impaired flexibility, Impaired UE functional use, Postural dysfunction, Pain, Decreased range of motion, Decreased endurance, Decreased mobility, Hypomobility, Increased muscle spasms, Improper body mechanics, Obesity  Visit Diagnosis: Chronic left shoulder pain  Stiffness of left shoulder, not elsewhere classified     Problem List Patient Active Problem List   Diagnosis Date Noted   Lymphedema 01/17/2016   Venous (peripheral) insufficiency 01/17/2016   Pain in limb 01/17/2016   Iron deficiency anemia due to chronic blood loss 01/09/2016   History of endometrial cancer 08/01/2015   Endometrial adenocarcinoma ([place]) 07/13/2015    Class: History of   Inguinal lymphadenopathy  07/13/2015   Long term current use of insulin ([place]) 12/12/2013   Microalbuminuria 12/12/2013   Diabetes ([place]) 07/28/2013   BP (high blood pressure) 07/28/2013   HLD (hyperlipidemia) 07/28/2013   Adiposity 07/28/2013   Obstructive apnea 07/28/2013   Arthritis, degenerative 07/28/2013   Apnea, sleep 07/28/2013   Thyroid nodule 07/28/2013   Durwin Reges DPT  Durwin Reges, PT 08/12/2021, 2:54 PM  [facility] [address] [place], [address] Phone: [phone]   Fax:  [phone]  Name: Christine Lawson MRN: [id number] Date of Birth: 01-08-1961

## 2021-08-15 ENCOUNTER — Encounter: Payer: Medicare Other | Admitting: Physical Therapy

## 2021-08-19 ENCOUNTER — Encounter: Payer: Self-pay | Admitting: Physical Therapy

## 2021-08-19 ENCOUNTER — Ambulatory Visit: Payer: Medicare Other

## 2021-08-19 DIAGNOSIS — G8929 Other chronic pain: Secondary | ICD-10-CM

## 2021-08-19 DIAGNOSIS — M25612 Stiffness of left shoulder, not elsewhere classified: Secondary | ICD-10-CM

## 2021-08-19 DIAGNOSIS — M6281 Muscle weakness (generalized): Secondary | ICD-10-CM

## 2021-08-19 DIAGNOSIS — M25512 Pain in left shoulder: Secondary | ICD-10-CM | POA: Diagnosis not present

## 2021-08-19 NOTE — Therapy (Signed)
[facility] [address] [place], [address] Phone: [phone]   Fax:  [phone]  Physical Therapy Treatment  Patient Details  Name: Christine Lawson MRN: [id number] Date of Birth: [birth year] No data recorded  Encounter Date: 08/19/2021   PT End of Session - 08/19/21 1353     Visit Number 4    Number of Visits 17    Date for PT Re-Evaluation 09/27/21    Authorization - Visit Number 4    Progress Note Due on Visit 10    PT Start Time 7322    PT Stop Time 1430    PT Time Calculation (min) 42 min    Activity Tolerance Patient tolerated treatment well    Behavior During Therapy [facility] for tasks assessed/performed             Past Medical History:  Diagnosis Date   Diabetes mellitus without complication ([place])    Endometrial adenocarcinoma ([place])    GERD (gastroesophageal reflux disease)    Hematochezia    Hyperlipidemia    Hypertension    IDA (iron deficiency anemia)    Inguinal lymphadenopathy 07/13/2015   Irritable bowel syndrome    Obesity    Osteoarthritis    Sleep apnea     Past Surgical History:  Procedure Laterality Date   ABDOMINAL HYSTERECTOMY  feb 2013   CARPAL TUNNEL RELEASE Left 03/20/2021   Procedure: CARPAL TUNNEL RELEASE ENDOSCOPIC;  Surgeon: Corky Mull, MD;  Location: [facility] ORS;  Service: Orthopedics;  Laterality: Left;   COLONOSCOPY WITH PROPOFOL N/A 08/02/2015   Procedure: COLONOSCOPY WITH PROPOFOL;  Surgeon: Manya Silvas, MD;  Location: [facility] ENDOSCOPY;  Service: Endoscopy;  Laterality: N/A;   ESOPHAGOGASTRODUODENOSCOPY (EGD) WITH PROPOFOL N/A 08/02/2015   Procedure: ESOPHAGOGASTRODUODENOSCOPY (EGD) WITH PROPOFOL;  Surgeon: Manya Silvas, MD;  Location: [facility] ENDOSCOPY;  Service: Endoscopy;  Laterality: N/A;    There were no vitals filed for this visit.   Subjective Assessment - 08/19/21 1352     Subjective Pt reports 6/10 NPS prior to treatment. Reports her knee is  bugging her today. No other concerns. Somewhat compliant with HEP.    Pertinent History Pt is a 61 year old female presenting with L shoulder pain over several months. Recently d/c OT at this clinic for L wrist pain, linked to this shoulder pain. Patient reports anterior superior pain that is irriated by overhead motion, carrying her grocery bags, lifting. Pt reports shoulder pain is a constant and is always at least NPRS 4/10; worst 9/10. Current pain level is 6/10 in the shoulder, that is dull/achy and sharp when she tries to use it, denies numbness and tingling. Has some popping/clicking with overhead lifting. Pain is alieviated by rest and ice. Pt lives with twin sister in handicap enabled apartment, has a aid that helps with cooking/heavy chores 4-6hours a day. Enjoys reading her bible. Does not drive, uses [employer] transportation. Reports Xrays from April revealed frozen shoulder. Pt denies N/V, B&B changes, unexplained weight fluctuation, saddle paresthesia, fever, night sweats, or unrelenting night pain at this time.    Limitations Lifting;House hold activities    How long can you sit comfortably? not limited by shoulder    How long can you stand comfortably? not limited by shoulder    How long can you walk comfortably? not limited by shoulder    Diagnostic tests xray in April reported by patient    Patient Stated Goals decrease pain  Currently in Pain? Yes    Pain Score 6     Pain Location Shoulder    Pain Onset More than a month ago            There.ex[place]clair Ship pulleys fex x15; abd x15 with cuing for technique with good carry over  Seated with back support: AAROM overhead raises with PVC pipe for scapulohumeral rhythm. 3 # AW, 2x12. 2x12, 4# AW.   Snow angels on wall. VC's for cervical positioning: x15.   Attempts for Lat pull down. Due to L knee pain pt unable to safely and successfully sit in proper position.   Chest press on OMEGA: 3x8, 35#  Doorway pec stretch: 3x30 sec.  Multimodal cuing for correct form. God carryover after first set.   Seated alternating punches for serratus anterior punches: 2x8, 2# DB's. VC's for scapular protraction throughout with fair carryover       PT Education - 08/19/21 1353     Education Details form/technique with exercise    Person(s) Educated Patient    Methods Explanation;Demonstration;Tactile cues;Verbal cues    Comprehension Verbalized understanding;Returned demonstration              PT Short Term Goals - 07/31/21 1849       PT SHORT TERM GOAL #1   Title Pt will be independent with HEP in order to improve strength and decrease pain in order to improve pain-free function in ADLs    Baseline 07/31/21 HEP given    Time 4    Period Weeks    Status New               PT Long Term Goals - 07/31/21 1849       PT LONG TERM GOAL #1   Title Pt will decrease worst pain as reported on NPRS by at least 3 points in order to demonstrate clinically significant reduction in pain.    Baseline 07/31/21 9/10    Time 8    Period Weeks    Status New      PT LONG TERM GOAL #2   Title Patient will increase FOTO score to 79 to demonstrate predicted increase in functional mobility to complete ADLs    Baseline 07/31/21    Time 8    Period Weeks    Status New      PT LONG TERM GOAL #3   Title Patient will demonstrate gross 4/5 bilat shoulder and scapular retractor strength in order to complete heavy household ADLs    Baseline 07/31/21 see eval 07/31/21    Time 8    Period Weeks    Status New      PT LONG TERM GOAL #4   Title Pt will demonstrate bilat shoulder flex of at least 160d bilat in order to reach into overhead shelf    Baseline 07/31/21 R/L 135/114d    Time 8    Period Weeks    Status New                   Plan - 08/19/21 1433     Clinical Impression Statement Continuing PT POC with focus on L shoulder mobility and periscapular strengthening. Unable to perform lat pulldown today due to L knee  pain but pt toleraitng other therex with good carryover with need for min to mod multimodal cuing for correct form/technique. No increased pain throughout session. Pt will continue to benefit from skilled PT services to progress pain free shoulder mobility  for overhead and forward ADL task completion.    Personal Factors and Comorbidities Comorbidity 3+;Fitness;Past/Current Experience;Transportation;Time since onset of injury/illness/exacerbation;Age;Sex    Comorbidities HTN, arthritis, HLD, DM    Examination-Activity Limitations Lift;Carry;Reach Overhead    Examination-Participation Restrictions Laundry;Cleaning;Community Activity;Meal Prep    Stability/Clinical Decision Making Evolving/Moderate complexity    Rehab Potential Good    PT Frequency 2x / week    PT Duration 8 weeks    PT Treatment/Interventions Moist Heat;Iontophoresis '4mg'$ /ml Dexamethasone;Therapeutic activities;Neuromuscular re-education;Joint Manipulations;Dry needling;Spinal Manipulations;Passive range of motion;Manual techniques;Patient/family education;Therapeutic exercise;Balance training;Ultrasound;Cryotherapy;Traction;ADLs/Self Care Home Management;Electrical Stimulation;Functional mobility training    PT Next Visit Plan scapulohumeral rhythm/mobility    PT Home Exercise Plan pulleys flex/abd, thoracic ext hands behind head, pec stretch    Consulted and Agree with Plan of Care Patient             Patient will benefit from skilled therapeutic intervention in order to improve the following deficits and impairments:  Decreased activity tolerance, Decreased strength, Increased fascial restricitons, Impaired flexibility, Impaired UE functional use, Postural dysfunction, Pain, Decreased range of motion, Decreased endurance, Decreased mobility, Hypomobility, Increased muscle spasms, Improper body mechanics, Obesity  Visit Diagnosis: Muscle weakness (generalized)  Chronic left shoulder pain  Stiffness of left shoulder, not  elsewhere classified     Problem List Patient Active Problem List   Diagnosis Date Noted   Lymphedema 01/17/2016   Venous (peripheral) insufficiency 01/17/2016   Pain in limb 01/17/2016   Iron deficiency anemia due to chronic blood loss 01/09/2016   History of endometrial cancer 08/01/2015   Endometrial adenocarcinoma ([place]) 07/13/2015    Class: History of   Inguinal lymphadenopathy 07/13/2015   Long term current use of insulin ([place]) 12/12/2013   Microalbuminuria 12/12/2013   Diabetes ([place]) 07/28/2013   BP (high blood pressure) 07/28/2013   HLD (hyperlipidemia) 07/28/2013   Adiposity 07/28/2013   Obstructive apnea 07/28/2013   Arthritis, degenerative 07/28/2013   Apnea, sleep 07/28/2013   Thyroid nodule 07/28/2013   Salem Caster. Fairly IV, PT, DPT Physical Therapist- [facility]  08/19/2021, 2:36 PM  [facility] [address] [place], [address] Phone: [phone]   Fax:  [phone]  Name: Christine Lawson MRN: [id number] Date of Birth: 02-03-1961

## 2021-08-21 ENCOUNTER — Ambulatory Visit: Payer: Medicare Other | Admitting: Physical Therapy

## 2021-08-21 ENCOUNTER — Other Ambulatory Visit: Payer: Self-pay | Admitting: Internal Medicine

## 2021-08-21 DIAGNOSIS — Z1231 Encounter for screening mammogram for malignant neoplasm of breast: Secondary | ICD-10-CM

## 2021-08-22 ENCOUNTER — Ambulatory Visit: Payer: Medicare Other | Admitting: Dietician

## 2021-08-26 ENCOUNTER — Ambulatory Visit: Payer: Medicare Other | Admitting: Physical Therapy

## 2021-08-29 ENCOUNTER — Encounter: Payer: Medicare Other | Admitting: Physical Therapy

## 2021-09-19 ENCOUNTER — Ambulatory Visit
Admission: RE | Admit: 2021-09-19 | Discharge: 2021-09-19 | Disposition: A | Discharge status: Home / Self Care | Payer: Medicare Other | Source: Ambulatory Visit | Attending: Internal Medicine | Admitting: Internal Medicine

## 2021-09-19 DIAGNOSIS — Z1231 Encounter for screening mammogram for malignant neoplasm of breast: Secondary | ICD-10-CM | POA: Diagnosis present

## 2021-10-08 ENCOUNTER — Ambulatory Visit: Payer: Medicare Other | Attending: Orthopedic Surgery | Admitting: Physical Therapy

## 2021-10-08 DIAGNOSIS — M25562 Pain in left knee: Secondary | ICD-10-CM | POA: Insufficient documentation

## 2021-10-08 DIAGNOSIS — G8929 Other chronic pain: Secondary | ICD-10-CM | POA: Insufficient documentation

## 2021-10-08 NOTE — Therapy (Signed)
OUTPATIENT PHYSICAL THERAPY SHOULDER EVALUATION   Patient Name: Christine Lawson MRN: [id number] DOB:12-30-1960, 61 y.o., female Today's Date: 10/12/2021   PT End of Session - 10/12/21 1315     Visit Number 1    Number of Visits 17    Date for PT Re-Evaluation 12/13/21    Authorization - Visit Number 1    Authorization - Number of Visits 10    Progress Note Due on Visit 10    PT Start Time 1346    PT Stop Time 1430    PT Time Calculation (min) 44 min    Activity Tolerance Patient limited by pain    Behavior During Therapy [facility] for tasks assessed/performed             Past Medical History:  Diagnosis Date   Diabetes mellitus without complication ([place])    Endometrial adenocarcinoma ([place])    GERD (gastroesophageal reflux disease)    Hematochezia    Hyperlipidemia    Hypertension    IDA (iron deficiency anemia)    Inguinal lymphadenopathy 07/13/2015   Irritable bowel syndrome    Obesity    Osteoarthritis    Sleep apnea    Past Surgical History:  Procedure Laterality Date   ABDOMINAL HYSTERECTOMY  feb 2013   CARPAL TUNNEL RELEASE Left 03/20/2021   Procedure: CARPAL TUNNEL RELEASE ENDOSCOPIC;  Surgeon: Corky Mull, MD;  Location: [facility] ORS;  Service: Orthopedics;  Laterality: Left;   COLONOSCOPY WITH PROPOFOL N/A 08/02/2015   Procedure: COLONOSCOPY WITH PROPOFOL;  Surgeon: Manya Silvas, MD;  Location: [facility] [place] [facility] ENDOSCOPY;  Service: Endoscopy;  Laterality: N/A;   ESOPHAGOGASTRODUODENOSCOPY (EGD) WITH PROPOFOL N/A 08/02/2015   Procedure: ESOPHAGOGASTRODUODENOSCOPY (EGD) WITH PROPOFOL;  Surgeon: Manya Silvas, MD;  Location: [facility] ENDOSCOPY;  Service: Endoscopy;  Laterality: N/A;   Patient Active Problem List   Diagnosis Date Noted   Lymphedema 01/17/2016   Venous (peripheral) insufficiency 01/17/2016   Pain in limb 01/17/2016   Iron deficiency anemia due to chronic blood loss 01/09/2016   History of endometrial cancer 08/01/2015   Endometrial adenocarcinoma ([place])  07/13/2015    Class: History of   Inguinal lymphadenopathy 07/13/2015   Long term current use of insulin ([place]) 12/12/2013   Microalbuminuria 12/12/2013   Diabetes ([place]) 07/28/2013   BP (high blood pressure) 07/28/2013   HLD (hyperlipidemia) 07/28/2013   Adiposity 07/28/2013   Obstructive apnea 07/28/2013   Arthritis, degenerative 07/28/2013   Apnea, sleep 07/28/2013   Thyroid nodule 07/28/2013    PCP: Tracie Harrier MD  REFERRING PROVIDER: Dorise Hiss PA  REFERRING DIAG: L knee pain   THERAPY DIAG:  Chronic pain of left knee - Plan: PT plan of care cert/re-cert  Rationale for Evaluation and Treatment Rehabilitation  ONSET DATE: July 2023  SUBJECTIVE:  SUBJECTIVE STATEMENT: Acute of chronic L knee pain  PERTINENT HISTORY: Pt returns to this clinic for L knee pain after a fall onto her knee about a month ago. Has had 2 falls in her home in the past 6 months while walking in her home without her cane.  She has chronic pain of both knees reporting 7/10 pain currently in each. Worst L knee pain 9/10 best 6/10. Reports pain that is dull, but can sharp during certain movements.  Pain is worsened by standing > 28mns, walking >174ms, listing, carrying, squatting. Pt is on disability, enjoys playing cards, and puzzles.   PAIN:  Are you having pain? Yes: NPRS scale: 7/10 Pain location: lateral knee Pain description: dull ache, with occassional sharp pain Aggravating factors: standing > 3066m, walking >63m26m listing, carrying, squatting.  Relieving factors: rest  PRECAUTIONS: Fall  WEIGHT BEARING RESTRICTIONS No  FALLS:  Has patient fallen in last 6 months? Yes. Number of falls 2  LIVING ENVIRONMENT: Lives with: lives with their family Lives in: House/apartment Stairs: No Has following  equipment at home: QuadProgrammer, multimedialkEnvironmental consultant wheeled, ShowElectronics engineerab bars, and hospital bed  OCCUPATION: Disability   PLOF: Independent with household mobility with device  PATIENT GOALS reduce pain   OBJECTIVE:   DIAGNOSTIC FINDINGS:  Xray + for OA only  PATIENT SURVEYS:  FOTO 54 goal of 55    SCREENING FOR RED FLAGS: Bowel or bladder incontinence: no Spinal tumors: No Cauda equina syndrome: No Compression fracture: No Abdominal aneurysm: No  COGNITION:  Overall cognitive status: Within functional limits for tasks assessed     SENSATION: WFL Peripheral neuropathy bilat feet  MUSCLE LENGTH: Hamstrings: shortened 50% in 90/90 test bilat with "cramp" on RLE with test ThomMarcello Moorest: shortened bilat evidenced trunk flexed posture, and by inability to lay on back with BLE ext, unable to attempt test position this session.   POSTURE: rounded shoulders, forward head, decreased lumbar lordosis, increased thoracic kyphosis, and flexed trunk ; wide BOS  PALPATION: TTP at lateral knee at distal ITB insertion  LUMBAR ROM:   Active /PROM A/PROM  eval  Flexion Limited by soft tissue restriction   Extension 50% limited  Right lateral flexion 25% limitation  Left lateral flexion 25% limitation  Right rotation   Left rotation    (Blank rows = not tested)  LOWER EXTREMITY ROM:     PROM/Active  Right eval Left eval  Hip flexion 100/90 bilat  Hip extension 0d/0d bilat  Hip abduction WNL/50% limited bilat  Hip adduction    Hip internal rotation 25% limited P/AROM bilat  Hip external rotation 25% limited P/AROM bilat  Knee flexion 100/80 95/82  Knee extension 0/10 bilat  Ankle dorsiflexion 10d/5d approx bilat  Ankle plantarflexion    Ankle inversion    Ankle eversion     (Blank rows = not tested)  LOWER EXTREMITY MMT:    MMT Right eval Left eval  Hip flexion 4- 3+  Hip extension 3 3  Hip abduction 3+ 3+  Hip adduction    Hip internal rotation 4 4   Hip external rotation 4 4-  Knee flexion 4 4-  Knee extension 4+ 4  Ankle dorsiflexion 4+ 4+  Ankle plantarflexion    Ankle inversion    Ankle eversion     (Blank rows = not tested) MMTs given within available range  5xSTS 26sec   FUNCTIONAL TESTS:  5 times sit to stand: 26sec 2 minute walk test: [id number]  10 meter walk test: 0.36ms STS transfer with momentum needed without use of Ues Supine <> sit min A Rolling modI with increased time needed  GAIT: Distance walked: 2017fAssistive device utilized: Quad cane large base Level of assistance: Modified independence Comments: 2MWT [id number] BOS throughout, minimal knee flex throughout swing, minimal hip ext at terminal stance    TODAY'S TREATMENT  PT reviewed the following HEP with patient with patient able to demonstrate a set of the following with min cuing for correction needed. PT educated patient on parameters of therex (how/when to inc/decrease intensity, frequency, rep/set range, stretch hold time, and purpose of therex) with verbalized understanding.  STS without UE support 3x 6-10 2-3x/week Hamstring stretch 30sec hold daily AAROM knee flex sitting x12-20 3-5sec hold 2-3x/day   PATIENT EDUCATION:  Education details: Patient was educated on diagnosis, anatomy and pathology involved, prognosis, role of PT, and was given an HEP, demonstrating exercise with proper form following verbal and tactile cues, and was given a paper hand out to continue exercise at home. Pt was educated on and agreed to plan of care.  Person educated: Patient Education method: Explanation, Demonstration, and Handouts Education comprehension: verbalized understanding and returned demonstration   HOME EXERCISE PROGRAM: STS without UE support 3x 6-10 2-3x/week Hamstring stretch 30sec hold daily AAROM knee flex sitting x12-20 3-5sec hold 2-3x/day  ASSESSMENT:  CLINICAL IMPRESSION: Patient is a [age]o. female who was seen today for physical  therapy evaluation and treatment for acute on chronic L knee pain. Pt with history of chronic bilat knee pain with OA, static and dynamic balance, seen for current pain due to a fall onto L knee. Impairments in decreased BLE and core strength, decreased bilat knee and hip mobility, decreased activity tolerance, decreased muscular and cardiopulmonary endurance, and pain. Activity limitations in functional transfers, standing, walking, squatting, stooping, lifting, carrying; inhibiting full participation in safe ADLs. Pt poses significant fall risk as well. Would benefit from skilled PT to address above deficits and promote optimal return to PLOF.    OBJECTIVE IMPAIRMENTS Abnormal gait, cardiopulmonary status limiting activity, decreased activity tolerance, decreased balance, decreased coordination, decreased endurance, decreased mobility, difficulty walking, decreased ROM, decreased strength, decreased safety awareness, increased fascial restrictions, impaired perceived functional ability, increased muscle spasms, impaired flexibility, impaired sensation, impaired UE functional use, improper body mechanics, postural dysfunction, obesity, and pain.   ACTIVITY LIMITATIONS carrying, lifting, bending, standing, squatting, sleeping, stairs, transfers, bed mobility, bathing, and dressing  PARTICIPATION LIMITATIONS: meal prep, cleaning, laundry, shopping, and community activity  PERSONAL FACTORS Age, Education, Fitness, Past/current experiences, Sex, Time since onset of injury/illness/exacerbation, Transportation, and 3+ comorbidities: DM2, OA, obesity, GERD, HLD, HTN, hx of endo cancer  are also affecting patient's functional outcome.   REHAB POTENTIAL: Fair    CLINICAL DECISION MAKING: Evolving/moderate complexity  EVALUATION COMPLEXITY: Moderate   GOALS: Goals reviewed with patient? Yes  SHORT TERM GOALS: Target date: 11/09/2021  Pt will be independent with HEP in order to improve strength and  balance in order to decrease fall risk and improve function at home and work.  Baseline:10/08/21 Goal status: INITIAL  LONG TERM GOALS: Target date: 12/07/2021  Patient will increase FOTO score to 45 to demonstrate predicted increase in functional mobility to complete ADLs Baseline: 10/08/21 44  Goal status: INITIAL  2.  Pt will decrease 5TSTS by to  least 11 seconds in order to demonstrate clinically significant age matched  BLE strength  Baseline: 10/08/21 26sec Goal status: INITIAL  3.  Pt  will increase 10MWT by to least 1.63ms in order to demonstrate clinically significant community ambulation speed, and to demonstrate decreased fall risk   Baseline: 10/09/21 0.31m Goal status: INITIAL  4. Pt will increase 2MWT to at least to [id number] order to demonstrate age matched improvement in cardiopulmonary endurance and community ambulation  Baseline: 10/08/21 [id number] status: INITIAL  5.  Pt will decrease worst pain as reported on NPRS by at least 3 points in order to demonstrate clinically significant reduction in pain. Baseline: 10/08/21 10/10 Goal status: INITIAL     PLAN: PT FREQUENCY: 1-2x/week  PT DURATION: 8 weeks  PLANNED INTERVENTIONS: Therapeutic exercises, Therapeutic activity, Neuromuscular re-education, Balance training, Gait training, Patient/Family education, Self Care, Joint mobilization, Joint manipulation, Stair training, Aquatic Therapy, Dry Needling, Electrical stimulation, Spinal manipulation, Spinal mobilization, Cryotherapy, Moist heat, Traction, Ultrasound, Manual therapy, and Re-evaluation.  PLAN FOR NEXT SESSION: HEP review, DGI    ChelDurwin Reges  ChelDurwin Reges 10/12/2021, 1:26 PM   ChelDurwin Reges 10/12/2021, 1:26 PM

## 2021-10-12 ENCOUNTER — Encounter: Payer: Self-pay | Admitting: Physical Therapy

## 2021-10-15 ENCOUNTER — Ambulatory Visit: Payer: Medicare Other | Admitting: Physical Therapy

## 2021-10-22 ENCOUNTER — Ambulatory Visit: Payer: Medicare Other | Admitting: Physical Therapy

## 2021-10-29 ENCOUNTER — Encounter: Payer: Self-pay | Admitting: Physical Therapy

## 2021-10-29 ENCOUNTER — Ambulatory Visit: Payer: Medicare Other | Admitting: Physical Therapy

## 2021-10-29 DIAGNOSIS — M25562 Pain in left knee: Secondary | ICD-10-CM | POA: Diagnosis not present

## 2021-10-29 DIAGNOSIS — G8929 Other chronic pain: Secondary | ICD-10-CM

## 2021-10-29 NOTE — Therapy (Signed)
OUTPATIENT PHYSICAL THERAPY SHOULDER EVALUATION   Patient Name: Christine Lawson MRN: [id number] DOB:January 14, 1961, 61 y.o., female Today's Date: 10/29/2021     Past Medical History:  Diagnosis Date   Diabetes mellitus without complication ([place])    Endometrial adenocarcinoma ([place])    GERD (gastroesophageal reflux disease)    Hematochezia    Hyperlipidemia    Hypertension    IDA (iron deficiency anemia)    Inguinal lymphadenopathy 07/13/2015   Irritable bowel syndrome    Obesity    Osteoarthritis    Sleep apnea    Past Surgical History:  Procedure Laterality Date   ABDOMINAL HYSTERECTOMY  feb 2013   CARPAL TUNNEL RELEASE Left 03/20/2021   Procedure: CARPAL TUNNEL RELEASE ENDOSCOPIC;  Surgeon: Corky Mull, MD;  Location: [facility] ORS;  Service: Orthopedics;  Laterality: Left;   COLONOSCOPY WITH PROPOFOL N/A 08/02/2015   Procedure: COLONOSCOPY WITH PROPOFOL;  Surgeon: Manya Silvas, MD;  Location: [facility] ENDOSCOPY;  Service: Endoscopy;  Laterality: N/A;   ESOPHAGOGASTRODUODENOSCOPY (EGD) WITH PROPOFOL N/A 08/02/2015   Procedure: ESOPHAGOGASTRODUODENOSCOPY (EGD) WITH PROPOFOL;  Surgeon: Manya Silvas, MD;  Location: [facility] ENDOSCOPY;  Service: Endoscopy;  Laterality: N/A;   Patient Active Problem List   Diagnosis Date Noted   Lymphedema 01/17/2016   Venous (peripheral) insufficiency 01/17/2016   Pain in limb 01/17/2016   Iron deficiency anemia due to chronic blood loss 01/09/2016   History of endometrial cancer 08/01/2015   Endometrial adenocarcinoma ([place]) 07/13/2015    Class: History of   Inguinal lymphadenopathy 07/13/2015   Long term current use of insulin ([place]) 12/12/2013   Microalbuminuria 12/12/2013   Diabetes ([place]) 07/28/2013   BP (high blood pressure) 07/28/2013   HLD (hyperlipidemia) 07/28/2013   Adiposity 07/28/2013   Obstructive apnea 07/28/2013   Arthritis, degenerative 07/28/2013   Apnea, sleep 07/28/2013   Thyroid nodule 07/28/2013    PCP: Tracie Harrier  MD  REFERRING PROVIDER: Dorise Hiss PA  REFERRING DIAG: L knee pain   THERAPY DIAG:  No diagnosis found.  Rationale for Evaluation and Treatment Rehabilitation  ONSET DATE: July 2023  SUBJECTIVE:                                                                                                                                                                                      SUBJECTIVE STATEMENT: Pt reports 7/10 L knee pain since last visit. Reports no falls. Has been compliant with HEP.   PERTINENT HISTORY: Pt returns to this clinic for L knee pain after a fall onto her knee about a month ago. Has had 2 falls in her home in the past 6 months while walking in her home without  her cane.  She has chronic pain of both knees reporting 7/10 pain currently in each. Worst L knee pain 9/10 best 6/10. Reports pain that is dull, but can sharp during certain movements.  Pain is worsened by standing > 15mns, walking >186ms, listing, carrying, squatting. Pt is on disability, enjoys playing cards, and puzzles.   PAIN:  Are you having pain? Yes: NPRS scale: 7/10 Pain location: lateral knee Pain description: dull ache, with occassional sharp pain Aggravating factors: standing > 304m, walking >25m13m listing, carrying, squatting.  Relieving factors: rest  PRECAUTIONS: Fall  WEIGHT BEARING RESTRICTIONS No  FALLS:  Has patient fallen in last 6 months? Yes. Number of falls 2  LIVING ENVIRONMENT: Lives with: lives with their family Lives in: House/apartment Stairs: No Has following equipment at home: QuadProgrammer, multimedialkEnvironmental consultant wheeled, ShowElectronics engineerab bars, and hospital bed  OCCUPATION: Disability   PLOF: Independent with household mobility with device  PATIENT GOALS reduce pain   OBJECTIVE:   DIAGNOSTIC FINDINGS:  Xray + for OA only  PATIENT SURVEYS:  FOTO 54 goal of 55    SCREENING FOR RED FLAGS: Bowel or bladder incontinence: no Spinal tumors: No Cauda equina  syndrome: No Compression fracture: No Abdominal aneurysm: No  COGNITION:  Overall cognitive status: Within functional limits for tasks assessed     SENSATION: WFL Peripheral neuropathy bilat feet  MUSCLE LENGTH: Hamstrings: shortened 50% in 90/90 test bilat with "cramp" on RLE with test ThomMarcello Moorest: shortened bilat evidenced trunk flexed posture, and by inability to lay on back with BLE ext, unable to attempt test position this session.   POSTURE: rounded shoulders, forward head, decreased lumbar lordosis, increased thoracic kyphosis, and flexed trunk ; wide BOS  PALPATION: TTP at lateral knee at distal ITB insertion  LUMBAR ROM:   Active /PROM A/PROM  eval  Flexion Limited by soft tissue restriction   Extension 50% limited  Right lateral flexion 25% limitation  Left lateral flexion 25% limitation  Right rotation   Left rotation    (Blank rows = not tested)  LOWER EXTREMITY ROM:     PROM/Active  Right eval Left eval  Hip flexion 100/90 bilat  Hip extension 0d/0d bilat  Hip abduction WNL/50% limited bilat  Hip adduction    Hip internal rotation 25% limited P/AROM bilat  Hip external rotation 25% limited P/AROM bilat  Knee flexion 100/80 95/82  Knee extension 0/10 bilat  Ankle dorsiflexion 10d/5d approx bilat  Ankle plantarflexion    Ankle inversion    Ankle eversion     (Blank rows = not tested)  LOWER EXTREMITY MMT:    MMT Right eval Left eval  Hip flexion 4- 3+  Hip extension 3 3  Hip abduction 3+ 3+  Hip adduction    Hip internal rotation 4 4  Hip external rotation 4 4-  Knee flexion 4 4-  Knee extension 4+ 4  Ankle dorsiflexion 4+ 4+  Ankle plantarflexion    Ankle inversion    Ankle eversion     (Blank rows = not tested) MMTs given within available range  5xSTS 26sec   FUNCTIONAL TESTS:  5 times sit to stand: 26sec 2 minute walk test: 150ft58fmeter walk test: 0.75m/s41m transfer with momentum needed without use of Ues Supine <> sit  min A Rolling modI with increased time needed  GAIT: Distance walked: 200ft A63ftive device utilized: Quad cane large base Level of assistance: Modified independence Comments: 2MWT 150ft; w45fBOS throughout,  minimal knee flex throughout swing, minimal hip ext at terminal stance    TODAY'S TREATMENT  Nustep seat 6 for AAROM L motion 19mns L 1 TRX squat with narrow BOS focus on knee flex x12 (12in step behind patient for target LLE  on 12in step max knee flex <> max knee ext x12  Step up onto 6in step unilateral handrail support 2x 6  with consistent cuing for normalized knee motion  STS without UE support 2x 10 with consistent cuing to maintain foot position YTB knee ext 2x 10 with cuing for eccentric control Hamstring stretch 30sec hold daily    PATIENT EDUCATION:  Education details: Patient was educated on diagnosis, anatomy and pathology involved, prognosis, role of PT, and was given an HEP, demonstrating exercise with proper form following verbal and tactile cues, and was given a paper hand out to continue exercise at home. Pt was educated on and agreed to plan of care.  Person educated: Patient Education method: Explanation, Demonstration, and Handouts Education comprehension: verbalized understanding and returned demonstration   HOME EXERCISE PROGRAM: STS without UE support 3x 6-10 2-3x/week Hamstring stretch 30sec hold daily AAROM knee flex sitting x12-20 3-5sec hold 2-3x/day  ASSESSMENT:  CLINICAL IMPRESSION: PT initiated therex progression for increased knee flexion mobility and general LE strength with success. Pt requires multimodal cuing of all therex for proper technique, with good effort throughout session. PT will continue progression as able.     OBJECTIVE IMPAIRMENTS Abnormal gait, cardiopulmonary status limiting activity, decreased activity tolerance, decreased balance, decreased coordination, decreased endurance, decreased mobility, difficulty walking,  decreased ROM, decreased strength, decreased safety awareness, increased fascial restrictions, impaired perceived functional ability, increased muscle spasms, impaired flexibility, impaired sensation, impaired UE functional use, improper body mechanics, postural dysfunction, obesity, and pain.   ACTIVITY LIMITATIONS carrying, lifting, bending, standing, squatting, sleeping, stairs, transfers, bed mobility, bathing, and dressing  PARTICIPATION LIMITATIONS: meal prep, cleaning, laundry, shopping, and community activity  PERSONAL FACTORS Age, Education, Fitness, Past/current experiences, Sex, Time since onset of injury/illness/exacerbation, Transportation, and 3+ comorbidities: DM2, OA, obesity, GERD, HLD, HTN, hx of endo cancer  are also affecting patient's functional outcome.   REHAB POTENTIAL: Fair    CLINICAL DECISION MAKING: Evolving/moderate complexity  EVALUATION COMPLEXITY: Moderate   GOALS: Goals reviewed with patient? Yes  SHORT TERM GOALS: Target date: 11/26/2021  Pt will be independent with HEP in order to improve strength and balance in order to decrease fall risk and improve function at home and work.  Baseline:10/08/21 Goal status: INITIAL  LONG TERM GOALS: Target date: 12/24/2021  Patient will increase FOTO score to 45 to demonstrate predicted increase in functional mobility to complete ADLs Baseline: 10/08/21 44  Goal status: INITIAL  2.  Pt will decrease 5TSTS by to  least 11 seconds in order to demonstrate clinically significant age matched  BLE strength  Baseline: 10/08/21 26sec Goal status: INITIAL  3.  Pt will increase 10MWT by to least 1.065m in order to demonstrate clinically significant community ambulation speed, and to demonstrate decreased fall risk   Baseline: 10/09/21 0.3871mGoal status: INITIAL  4. Pt will increase 2MWT to at least to [id number] order to demonstrate age matched improvement in cardiopulmonary endurance and community ambulation  Baseline:  10/08/21 150ft65fl status: INITIAL  5.  Pt will decrease worst pain as reported on NPRS by at least 3 points in order to demonstrate clinically significant reduction in pain. Baseline: 10/08/21 10/10 Goal status: INITIAL     PLAN: PT FREQUENCY: 1-2x/week  PT DURATION: 8 weeks  PLANNED INTERVENTIONS: Therapeutic exercises, Therapeutic activity, Neuromuscular re-education, Balance training, Gait training, Patient/Family education, Self Care, Joint mobilization, Joint manipulation, Stair training, Aquatic Therapy, Dry Needling, Electrical stimulation, Spinal manipulation, Spinal mobilization, Cryotherapy, Moist heat, Traction, Ultrasound, Manual therapy, and Re-evaluation.  PLAN FOR NEXT SESSION: HEP review, DGI    Durwin Reges DPT  Durwin Reges, PT 10/29/2021, 3:15 PM   Durwin Reges, PT 10/29/2021, 3:15 PM

## 2021-11-05 ENCOUNTER — Encounter: Payer: Self-pay | Admitting: Physical Therapy

## 2021-11-05 ENCOUNTER — Ambulatory Visit: Payer: Medicare Other | Attending: Orthopedic Surgery | Admitting: Physical Therapy

## 2021-11-05 DIAGNOSIS — G8929 Other chronic pain: Secondary | ICD-10-CM | POA: Diagnosis present

## 2021-11-05 DIAGNOSIS — M25562 Pain in left knee: Secondary | ICD-10-CM | POA: Diagnosis present

## 2021-11-05 DIAGNOSIS — M6281 Muscle weakness (generalized): Secondary | ICD-10-CM | POA: Insufficient documentation

## 2021-11-05 NOTE — Therapy (Signed)
OUTPATIENT PHYSICAL THERAPY SHOULDER EVALUATION   Patient Name: Christine Lawson MRN: [id number] DOB:1961-01-01, 61 y.o., female Today's Date: 11/05/2021   PT End of Session - 11/05/21 1408     Visit Number 3    Number of Visits 17    Date for PT Re-Evaluation 12/13/21    Authorization - Visit Number 3    Authorization - Number of Visits 10    Progress Note Due on Visit 10    PT Start Time 1340    PT Stop Time 1420    PT Time Calculation (min) 40 min    Activity Tolerance Patient limited by pain    Behavior During Therapy [facility] for tasks assessed/performed              Past Medical History:  Diagnosis Date   Diabetes mellitus without complication ([place])    Endometrial adenocarcinoma ([place])    GERD (gastroesophageal reflux disease)    Hematochezia    Hyperlipidemia    Hypertension    IDA (iron deficiency anemia)    Inguinal lymphadenopathy 07/13/2015   Irritable bowel syndrome    Obesity    Osteoarthritis    Sleep apnea    Past Surgical History:  Procedure Laterality Date   ABDOMINAL HYSTERECTOMY  feb 2013   CARPAL TUNNEL RELEASE Left 03/20/2021   Procedure: CARPAL TUNNEL RELEASE ENDOSCOPIC;  Surgeon: Corky Mull, MD;  Location: [facility] ORS;  Service: Orthopedics;  Laterality: Left;   COLONOSCOPY WITH PROPOFOL N/A 08/02/2015   Procedure: COLONOSCOPY WITH PROPOFOL;  Surgeon: Manya Silvas, MD;  Location: [facility] ENDOSCOPY;  Service: Endoscopy;  Laterality: N/A;   ESOPHAGOGASTRODUODENOSCOPY (EGD) WITH PROPOFOL N/A 08/02/2015   Procedure: ESOPHAGOGASTRODUODENOSCOPY (EGD) WITH PROPOFOL;  Surgeon: Manya Silvas, MD;  Location: [facility] ENDOSCOPY;  Service: Endoscopy;  Laterality: N/A;   Patient Active Problem List   Diagnosis Date Noted   Lymphedema 01/17/2016   Venous (peripheral) insufficiency 01/17/2016   Pain in limb 01/17/2016   Iron deficiency anemia due to chronic blood loss 01/09/2016   History of endometrial cancer 08/01/2015   Endometrial adenocarcinoma ([place])  07/13/2015    Class: History of   Inguinal lymphadenopathy 07/13/2015   Long term current use of insulin ([place]) 12/12/2013   Microalbuminuria 12/12/2013   Diabetes ([place]) 07/28/2013   BP (high blood pressure) 07/28/2013   HLD (hyperlipidemia) 07/28/2013   Adiposity 07/28/2013   Obstructive apnea 07/28/2013   Arthritis, degenerative 07/28/2013   Apnea, sleep 07/28/2013   Thyroid nodule 07/28/2013    PCP: Tracie Harrier MD  REFERRING PROVIDER: Dorise Hiss PA  REFERRING DIAG: L knee pain   THERAPY DIAG:  Chronic pain of left knee  Rationale for Evaluation and Treatment Rehabilitation  ONSET DATE: July 2023  SUBJECTIVE:  SUBJECTIVE STATEMENT: Pt reports 6/10 L knee pain since last visit. Reports no falls. Has been compliant with HEP.   PERTINENT HISTORY: Pt returns to this clinic for L knee pain after a fall onto her knee about a month ago. Has had 2 falls in her home in the past 6 months while walking in her home without her cane.  She has chronic pain of both knees reporting 7/10 pain currently in each. Worst L knee pain 9/10 best 6/10. Reports pain that is dull, but can sharp during certain movements.  Pain is worsened by standing > 70mns, walking >116ms, listing, carrying, squatting. Pt is on disability, enjoys playing cards, and puzzles.   PAIN:  Are you having pain? Yes: NPRS scale: 7/10 Pain location: lateral knee Pain description: dull ache, with occassional sharp pain Aggravating factors: standing > 3066m, walking >9m63m listing, carrying, squatting.  Relieving factors: rest  PRECAUTIONS: Fall  WEIGHT BEARING RESTRICTIONS No  FALLS:  Has patient fallen in last 6 months? Yes. Number of falls 2  LIVING ENVIRONMENT: Lives with: lives with their family Lives in:  House/apartment Stairs: No Has following equipment at home: QuadProgrammer, multimedialkEnvironmental consultant wheeled, ShowElectronics engineerab bars, and hospital bed  OCCUPATION: Disability   PLOF: Independent with household mobility with device  PATIENT GOALS reduce pain   OBJECTIVE:   DIAGNOSTIC FINDINGS:  Xray + for OA only  PATIENT SURVEYS:  FOTO 54 goal of 55    SCREENING FOR RED FLAGS: Bowel or bladder incontinence: no Spinal tumors: No Cauda equina syndrome: No Compression fracture: No Abdominal aneurysm: No  COGNITION:  Overall cognitive status: Within functional limits for tasks assessed     SENSATION: WFL Peripheral neuropathy bilat feet  MUSCLE LENGTH: Hamstrings: shortened 50% in 90/90 test bilat with "cramp" on RLE with test ThomMarcello Moorest: shortened bilat evidenced trunk flexed posture, and by inability to lay on back with BLE ext, unable to attempt test position this session.   POSTURE: rounded shoulders, forward head, decreased lumbar lordosis, increased thoracic kyphosis, and flexed trunk ; wide BOS  PALPATION: TTP at lateral knee at distal ITB insertion  LUMBAR ROM:   Active /PROM A/PROM  eval  Flexion Limited by soft tissue restriction   Extension 50% limited  Right lateral flexion 25% limitation  Left lateral flexion 25% limitation  Right rotation   Left rotation    (Blank rows = not tested)  LOWER EXTREMITY ROM:     PROM/Active  Right eval Left eval  Hip flexion 100/90 bilat  Hip extension 0d/0d bilat  Hip abduction WNL/50% limited bilat  Hip adduction    Hip internal rotation 25% limited P/AROM bilat  Hip external rotation 25% limited P/AROM bilat  Knee flexion 100/80 95/82  Knee extension 0/10 bilat  Ankle dorsiflexion 10d/5d approx bilat  Ankle plantarflexion    Ankle inversion    Ankle eversion     (Blank rows = not tested)  LOWER EXTREMITY MMT:    MMT Right eval Left eval  Hip flexion 4- 3+  Hip extension 3 3  Hip abduction 3+ 3+   Hip adduction    Hip internal rotation 4 4  Hip external rotation 4 4-  Knee flexion 4 4-  Knee extension 4+ 4  Ankle dorsiflexion 4+ 4+  Ankle plantarflexion    Ankle inversion    Ankle eversion     (Blank rows = not tested) MMTs given within available range  5xSTS 26sec   FUNCTIONAL TESTS:  5 times sit to stand: 26sec 2 minute walk test: 133f 10 meter walk test: 0.328m STS transfer with momentum needed without use of Ues Supine <> sit min A Rolling modI with increased time needed  GAIT: Distance walked: 2002fssistive device utilized: Quad cane large base Level of assistance: Modified independence Comments: 2MWT 150f69fide BOS throughout, minimal knee flex throughout swing, minimal hip ext at terminal stance    TODAY'S TREATMENT  Nustep seat 6 for AAROM L motion 5min27m 1 TRX squat with narrow BOS focus on knee flex x12 (12in step behind patient for target) OMEGA Leg Press 35# x10 45# x10 55# x10 with cuing for eccentric cotrol with good carry over over Forward lunge x6; alt fwd lunge x12 (6 each LE) with good carry over of demo LLE  on 12in step max knee flex <> max knee ext x12  STS without UE support 2x 10 with modcuing to maintain foot position Hamstring stretch 30sec hold daily    PATIENT EDUCATION:  Education details: Patient was educated on diagnosis, anatomy and pathology involved, prognosis, role of PT, and was given an HEP, demonstrating exercise with proper form following verbal and tactile cues, and was given a paper hand out to continue exercise at home. Pt was educated on and agreed to plan of care.  Person educated: Patient Education method: Explanation, Demonstration, and Handouts Education comprehension: verbalized understanding and returned demonstration   HOME EXERCISE PROGRAM: STS without UE support 3x 6-10 2-3x/week Hamstring stretch 30sec hold daily AAROM knee flex sitting x12-20 3-5sec hold 2-3x/day  ASSESSMENT:  CLINICAL  IMPRESSION: PT initiated therex progression for increased knee flexion mobility and general LE strength with success. Pt with improved motor control of previously completed exercises. Pt requires multimodal cuing of all therex for proper technique, with good effort throughout session. PT will continue progression as able.     OBJECTIVE IMPAIRMENTS Abnormal gait, cardiopulmonary status limiting activity, decreased activity tolerance, decreased balance, decreased coordination, decreased endurance, decreased mobility, difficulty walking, decreased ROM, decreased strength, decreased safety awareness, increased fascial restrictions, impaired perceived functional ability, increased muscle spasms, impaired flexibility, impaired sensation, impaired UE functional use, improper body mechanics, postural dysfunction, obesity, and pain.   ACTIVITY LIMITATIONS carrying, lifting, bending, standing, squatting, sleeping, stairs, transfers, bed mobility, bathing, and dressing  PARTICIPATION LIMITATIONS: meal prep, cleaning, laundry, shopping, and community activity  PERSONAL FACTORS Age, Education, Fitness, Past/current experiences, Sex, Time since onset of injury/illness/exacerbation, Transportation, and 3+ comorbidities: DM2, OA, obesity, GERD, HLD, HTN, hx of endo cancer  are also affecting patient's functional outcome.   REHAB POTENTIAL: Fair    CLINICAL DECISION MAKING: Evolving/moderate complexity  EVALUATION COMPLEXITY: Moderate   GOALS: Goals reviewed with patient? Yes  SHORT TERM GOALS: Target date: 12/03/2021  Pt will be independent with HEP in order to improve strength and balance in order to decrease fall risk and improve function at home and work.  Baseline:10/08/21 Goal status: INITIAL  LONG TERM GOALS: Target date: 12/31/2021  Patient will increase FOTO score to 45 to demonstrate predicted increase in functional mobility to complete ADLs Baseline: 10/08/21 44  Goal status: INITIAL  2.  Pt  will decrease 5TSTS by to  least 11 seconds in order to demonstrate clinically significant age matched  BLE strength  Baseline: 10/08/21 26sec Goal status: INITIAL  3.  Pt will increase 10MWT by to least 1.32m/s 74morder to demonstrate clinically significant community ambulation speed, and to demonstrate decreased fall risk   Baseline: 10/09/21 0.74m/s 14m  status: INITIAL  4. Pt will increase 2MWT to at least to 570f in order to demonstrate age matched improvement in cardiopulmonary endurance and community ambulation  Baseline: 10/08/21 1559fGoal status: INITIAL  5.  Pt will decrease worst pain as reported on NPRS by at least 3 points in order to demonstrate clinically significant reduction in pain. Baseline: 10/08/21 10/10 Goal status: INITIAL     PLAN: PT FREQUENCY: 1-2x/week  PT DURATION: 8 weeks  PLANNED INTERVENTIONS: Therapeutic exercises, Therapeutic activity, Neuromuscular re-education, Balance training, Gait training, Patient/Family education, Self Care, Joint mobilization, Joint manipulation, Stair training, Aquatic Therapy, Dry Needling, Electrical stimulation, Spinal manipulation, Spinal mobilization, Cryotherapy, Moist heat, Traction, Ultrasound, Manual therapy, and Re-evaluation.  PLAN FOR NEXT SESSION: HEP review, DGI    ChDurwin RegesPT  ChDurwin RegesPT 11/05/2021, 2:20 PM   ChDurwin RegesPT 11/05/2021, 2:20 PM

## 2021-11-07 ENCOUNTER — Ambulatory Visit: Payer: Medicare Other | Admitting: Physical Therapy

## 2021-11-12 ENCOUNTER — Ambulatory Visit: Payer: Medicare Other | Admitting: Physical Therapy

## 2021-11-12 ENCOUNTER — Encounter: Payer: Self-pay | Admitting: Physical Therapy

## 2021-11-12 DIAGNOSIS — M25562 Pain in left knee: Secondary | ICD-10-CM | POA: Diagnosis not present

## 2021-11-12 DIAGNOSIS — G8929 Other chronic pain: Secondary | ICD-10-CM

## 2021-11-12 NOTE — Therapy (Signed)
OUTPATIENT PHYSICAL THERAPY SHOULDER EVALUATION   Patient Name: Christine Lawson MRN: [id number] DOB:16-Nov-1960, 61 y.o., female Today's Date: 11/12/2021   PT End of Session - 11/12/21 1311     Visit Number 4    Number of Visits 17    Date for PT Re-Evaluation 12/13/21    Authorization - Visit Number 4    Authorization - Number of Visits 10    Progress Note Due on Visit 10    PT Start Time 4401    PT Stop Time 1345    PT Time Calculation (min) 40 min    Activity Tolerance Patient limited by pain    Behavior During Therapy [facility] for tasks assessed/performed               Past Medical History:  Diagnosis Date   Diabetes mellitus without complication ([place])    Endometrial adenocarcinoma ([place])    GERD (gastroesophageal reflux disease)    Hematochezia    Hyperlipidemia    Hypertension    IDA (iron deficiency anemia)    Inguinal lymphadenopathy 07/13/2015   Irritable bowel syndrome    Obesity    Osteoarthritis    Sleep apnea    Past Surgical History:  Procedure Laterality Date   ABDOMINAL HYSTERECTOMY  feb 2013   CARPAL TUNNEL RELEASE Left 03/20/2021   Procedure: CARPAL TUNNEL RELEASE ENDOSCOPIC;  Surgeon: Corky Mull, MD;  Location: [facility] ORS;  Service: Orthopedics;  Laterality: Left;   COLONOSCOPY WITH PROPOFOL N/A 08/02/2015   Procedure: COLONOSCOPY WITH PROPOFOL;  Surgeon: Manya Silvas, MD;  Location: [facility] ENDOSCOPY;  Service: Endoscopy;  Laterality: N/A;   ESOPHAGOGASTRODUODENOSCOPY (EGD) WITH PROPOFOL N/A 08/02/2015   Procedure: ESOPHAGOGASTRODUODENOSCOPY (EGD) WITH PROPOFOL;  Surgeon: Manya Silvas, MD;  Location: [facility] ENDOSCOPY;  Service: Endoscopy;  Laterality: N/A;   Patient Active Problem List   Diagnosis Date Noted   Lymphedema 01/17/2016   Venous (peripheral) insufficiency 01/17/2016   Pain in limb 01/17/2016   Iron deficiency anemia due to chronic blood loss 01/09/2016   History of endometrial cancer 08/01/2015   Endometrial adenocarcinoma ([place])  07/13/2015    Class: History of   Inguinal lymphadenopathy 07/13/2015   Long term current use of insulin ([place]) 12/12/2013   Microalbuminuria 12/12/2013   Diabetes ([place]) 07/28/2013   BP (high blood pressure) 07/28/2013   HLD (hyperlipidemia) 07/28/2013   Adiposity 07/28/2013   Obstructive apnea 07/28/2013   Arthritis, degenerative 07/28/2013   Apnea, sleep 07/28/2013   Thyroid nodule 07/28/2013    PCP: Tracie Harrier MD  REFERRING PROVIDER: Dorise Hiss PA  REFERRING DIAG: L knee pain   THERAPY DIAG:  Chronic pain of left knee  Rationale for Evaluation and Treatment Rehabilitation  ONSET DATE: July 2023  SUBJECTIVE:  SUBJECTIVE STATEMENT: Pt reports 6/10 L knee pain since last visit. Reports no falls. Has been compliant with HEP.   PERTINENT HISTORY: Pt returns to this clinic for L knee pain after a fall onto her knee about a month ago. Has had 2 falls in her home in the past 6 months while walking in her home without her cane.  She has chronic pain of both knees reporting 7/10 pain currently in each. Worst L knee pain 9/10 best 6/10. Reports pain that is dull, but can sharp during certain movements.  Pain is worsened by standing > 25mns, walking >198ms, listing, carrying, squatting. Pt is on disability, enjoys playing cards, and puzzles.   PAIN:  Are you having pain? Yes: NPRS scale: 7/10 Pain location: lateral knee Pain description: dull ache, with occassional sharp pain Aggravating factors: standing > 3034m, walking >19m105m listing, carrying, squatting.  Relieving factors: rest  PRECAUTIONS: Fall  WEIGHT BEARING RESTRICTIONS No  FALLS:  Has patient fallen in last 6 months? Yes. Number of falls 2  LIVING ENVIRONMENT: Lives with: lives with their family Lives in:  House/apartment Stairs: No Has following equipment at home: QuadProgrammer, multimedialkEnvironmental consultant wheeled, ShowElectronics engineerab bars, and hospital bed  OCCUPATION: Disability   PLOF: Independent with household mobility with device  PATIENT GOALS reduce pain   OBJECTIVE:   DIAGNOSTIC FINDINGS:  Xray + for OA only  PATIENT SURVEYS:  FOTO 54 goal of 55    SCREENING FOR RED FLAGS: Bowel or bladder incontinence: no Spinal tumors: No Cauda equina syndrome: No Compression fracture: No Abdominal aneurysm: No  COGNITION:  Overall cognitive status: Within functional limits for tasks assessed     SENSATION: WFL Peripheral neuropathy bilat feet  MUSCLE LENGTH: Hamstrings: shortened 50% in 90/90 test bilat with "cramp" on RLE with test ThomMarcello Moorest: shortened bilat evidenced trunk flexed posture, and by inability to lay on back with BLE ext, unable to attempt test position this session.   POSTURE: rounded shoulders, forward head, decreased lumbar lordosis, increased thoracic kyphosis, and flexed trunk ; wide BOS  PALPATION: TTP at lateral knee at distal ITB insertion  LUMBAR ROM:   Active /PROM A/PROM  eval  Flexion Limited by soft tissue restriction   Extension 50% limited  Right lateral flexion 25% limitation  Left lateral flexion 25% limitation  Right rotation   Left rotation    (Blank rows = not tested)  LOWER EXTREMITY ROM:     PROM/Active  Right eval Left eval  Hip flexion 100/90 bilat  Hip extension 0d/0d bilat  Hip abduction WNL/50% limited bilat  Hip adduction    Hip internal rotation 25% limited P/AROM bilat  Hip external rotation 25% limited P/AROM bilat  Knee flexion 100/80 95/82  Knee extension 0/10 bilat  Ankle dorsiflexion 10d/5d approx bilat  Ankle plantarflexion    Ankle inversion    Ankle eversion     (Blank rows = not tested)  LOWER EXTREMITY MMT:    MMT Right eval Left eval  Hip flexion 4- 3+  Hip extension 3 3  Hip abduction 3+ 3+   Hip adduction    Hip internal rotation 4 4  Hip external rotation 4 4-  Knee flexion 4 4-  Knee extension 4+ 4  Ankle dorsiflexion 4+ 4+  Ankle plantarflexion    Ankle inversion    Ankle eversion     (Blank rows = not tested) MMTs given within available range  5xSTS 26sec   FUNCTIONAL TESTS:  5 times sit to stand: 26sec 2 minute walk test: 148f 10 meter walk test: 0.379m STS transfer with momentum needed without use of Ues Supine <> sit min A Rolling modI with increased time needed  GAIT: Distance walked: 2004fssistive device utilized: Quad cane large base Level of assistance: Modified independence Comments: 2MWT [id number] BOS throughout, minimal knee flex throughout swing, minimal hip ext at terminal stance    TODAY'S TREATMENT  Nustep seat 6 for AAROM L motion 5min37m 1 squat with narrow BOS BUE pull on ar focus on knee flex 2 x12 (12in step behind patient for target) OMEGA Leg Press 55# x10 with cuing for eccentric cotrol with good carry over over Forward lunge 2x 10 with bilat TRX; max cuing needed for technique with some carry over STS without UE support 2x 10 with modcuing to maintain foot position and for trunk upright Hamstring stretch 30sec hold daily    PATIENT EDUCATION:  Education details: Patient was educated on diagnosis, anatomy and pathology involved, prognosis, role of PT, and was given an HEP, demonstrating exercise with proper form following verbal and tactile cues, and was given a paper hand out to continue exercise at home. Pt was educated on and agreed to plan of care.  Person educated: Patient Education method: Explanation, Demonstration, and Handouts Education comprehension: verbalized understanding and returned demonstration   HOME EXERCISE PROGRAM: STS without UE support 3x 6-10 2-3x/week Hamstring stretch 30sec hold daily AAROM knee flex sitting x12-20 3-5sec hold 2-3x/day  ASSESSMENT:  CLINICAL IMPRESSION: PT initiated therex  progression for increased knee flexion mobility and general LE strength with success. Pt with improved motor control of previously completed exercises. Pt requires multimodal cuing of all therex for proper technique, with good effort throughout session. PT will continue progression as able.     OBJECTIVE IMPAIRMENTS Abnormal gait, cardiopulmonary status limiting activity, decreased activity tolerance, decreased balance, decreased coordination, decreased endurance, decreased mobility, difficulty walking, decreased ROM, decreased strength, decreased safety awareness, increased fascial restrictions, impaired perceived functional ability, increased muscle spasms, impaired flexibility, impaired sensation, impaired UE functional use, improper body mechanics, postural dysfunction, obesity, and pain.   ACTIVITY LIMITATIONS carrying, lifting, bending, standing, squatting, sleeping, stairs, transfers, bed mobility, bathing, and dressing  PARTICIPATION LIMITATIONS: meal prep, cleaning, laundry, shopping, and community activity  PERSONAL FACTORS Age, Education, Fitness, Past/current experiences, Sex, Time since onset of injury/illness/exacerbation, Transportation, and 3+ comorbidities: DM2, OA, obesity, GERD, HLD, HTN, hx of endo cancer  are also affecting patient's functional outcome.   REHAB POTENTIAL: Fair    CLINICAL DECISION MAKING: Evolving/moderate complexity  EVALUATION COMPLEXITY: Moderate   GOALS: Goals reviewed with patient? Yes  SHORT TERM GOALS: Target date: 12/10/2021  Pt will be independent with HEP in order to improve strength and balance in order to decrease fall risk and improve function at home and work.  Baseline:10/08/21 Goal status: INITIAL  LONG TERM GOALS: Target date: 01/07/2022  Patient will increase FOTO score to 45 to demonstrate predicted increase in functional mobility to complete ADLs Baseline: 10/08/21 44  Goal status: INITIAL  2.  Pt will decrease 5TSTS by to  least  11 seconds in order to demonstrate clinically significant age matched  BLE strength  Baseline: 10/08/21 26sec Goal status: INITIAL  3.  Pt will increase 10MWT by to least 1.32m/s 81morder to demonstrate clinically significant community ambulation speed, and to demonstrate decreased fall risk   Baseline: 10/09/21 0.56m/s 432m status: INITIAL  4. Pt will increase 2MWT to at  least to 539f in order to demonstrate age matched improvement in cardiopulmonary endurance and community ambulation  Baseline: 10/08/21 1541fGoal status: INITIAL  5.  Pt will decrease worst pain as reported on NPRS by at least 3 points in order to demonstrate clinically significant reduction in pain. Baseline: 10/08/21 10/10 Goal status: INITIAL     PLAN: PT FREQUENCY: 1-2x/week  PT DURATION: 8 weeks  PLANNED INTERVENTIONS: Therapeutic exercises, Therapeutic activity, Neuromuscular re-education, Balance training, Gait training, Patient/Family education, Self Care, Joint mobilization, Joint manipulation, Stair training, Aquatic Therapy, Dry Needling, Electrical stimulation, Spinal manipulation, Spinal mobilization, Cryotherapy, Moist heat, Traction, Ultrasound, Manual therapy, and Re-evaluation.  PLAN FOR NEXT SESSION: HEP review, DGI    ChDurwin RegesPT  ChDurwin RegesPT 11/12/2021, 1:46 PM   ChDurwin RegesPT 11/12/2021, 1:46 PM

## 2021-11-14 ENCOUNTER — Encounter: Payer: Self-pay | Admitting: Physical Therapy

## 2021-11-14 ENCOUNTER — Ambulatory Visit: Payer: Medicare Other | Admitting: Physical Therapy

## 2021-11-14 DIAGNOSIS — M25562 Pain in left knee: Secondary | ICD-10-CM | POA: Diagnosis not present

## 2021-11-14 DIAGNOSIS — M6281 Muscle weakness (generalized): Secondary | ICD-10-CM

## 2021-11-14 DIAGNOSIS — G8929 Other chronic pain: Secondary | ICD-10-CM

## 2021-11-14 NOTE — Therapy (Signed)
OUTPATIENT PHYSICAL THERAPY SHOULDER EVALUATION   Patient Name: Christine Lawson MRN: [id number] DOB:1960/09/16, 61 y.o., female Today's Date: 11/14/2021   PT End of Session - 11/14/21 1436     Visit Number 5    Number of Visits 17    Date for PT Re-Evaluation 12/13/21    Authorization - Visit Number 5    Authorization - Number of Visits 10    Progress Note Due on Visit 10    PT Start Time 8832    PT Stop Time 1511    PT Time Calculation (min) 38 min    Activity Tolerance Patient limited by pain    Behavior During Therapy [facility] for tasks assessed/performed                Past Medical History:  Diagnosis Date   Diabetes mellitus without complication ([place])    Endometrial adenocarcinoma ([place])    GERD (gastroesophageal reflux disease)    Hematochezia    Hyperlipidemia    Hypertension    IDA (iron deficiency anemia)    Inguinal lymphadenopathy 07/13/2015   Irritable bowel syndrome    Obesity    Osteoarthritis    Sleep apnea    Past Surgical History:  Procedure Laterality Date   ABDOMINAL HYSTERECTOMY  feb 2013   CARPAL TUNNEL RELEASE Left 03/20/2021   Procedure: CARPAL TUNNEL RELEASE ENDOSCOPIC;  Surgeon: Corky Mull, MD;  Location: [facility] ORS;  Service: Orthopedics;  Laterality: Left;   COLONOSCOPY WITH PROPOFOL N/A 08/02/2015   Procedure: COLONOSCOPY WITH PROPOFOL;  Surgeon: Manya Silvas, MD;  Location: [facility] ENDOSCOPY;  Service: Endoscopy;  Laterality: N/A;   ESOPHAGOGASTRODUODENOSCOPY (EGD) WITH PROPOFOL N/A 08/02/2015   Procedure: ESOPHAGOGASTRODUODENOSCOPY (EGD) WITH PROPOFOL;  Surgeon: Manya Silvas, MD;  Location: [facility] ENDOSCOPY;  Service: Endoscopy;  Laterality: N/A;   Patient Active Problem List   Diagnosis Date Noted   Lymphedema 01/17/2016   Venous (peripheral) insufficiency 01/17/2016   Pain in limb 01/17/2016   Iron deficiency anemia due to chronic blood loss 01/09/2016   History of endometrial cancer 08/01/2015   Endometrial adenocarcinoma ([place])  07/13/2015    Class: History of   Inguinal lymphadenopathy 07/13/2015   Long term current use of insulin ([place]) 12/12/2013   Microalbuminuria 12/12/2013   Diabetes ([place]) 07/28/2013   BP (high blood pressure) 07/28/2013   HLD (hyperlipidemia) 07/28/2013   Adiposity 07/28/2013   Obstructive apnea 07/28/2013   Arthritis, degenerative 07/28/2013   Apnea, sleep 07/28/2013   Thyroid nodule 07/28/2013    PCP: Tracie Harrier MD  REFERRING PROVIDER: Dorise Hiss PA  REFERRING DIAG: L knee pain   THERAPY DIAG:  Chronic pain of left knee  Muscle weakness (generalized)  Rationale for Evaluation and Treatment Rehabilitation  ONSET DATE: July 2023  SUBJECTIVE:  SUBJECTIVE STATEMENT: Pt reports 7/10 L knee pain today, reporting she "twisted her knee" when she stood up yesterday. She has been having GI issues and has not done her HEP  PERTINENT HISTORY: Pt returns to this clinic for L knee pain after a fall onto her knee about a month ago. Has had 2 falls in her home in the past 6 months while walking in her home without her cane.  She has chronic pain of both knees reporting 7/10 pain currently in each. Worst L knee pain 9/10 best 6/10. Reports pain that is dull, but can sharp during certain movements.  Pain is worsened by standing > 56mns, walking >139ms, listing, carrying, squatting. Pt is on disability, enjoys playing cards, and puzzles.   PAIN:  Are you having pain? Yes: NPRS scale: 7/10 Pain location: lateral knee Pain description: dull ache, with occassional sharp pain Aggravating factors: standing > 309m, walking >89m89m listing, carrying, squatting.  Relieving factors: rest  PRECAUTIONS: Fall  WEIGHT BEARING RESTRICTIONS No  FALLS:  Has patient fallen in last 6 months? Yes. Number of falls  2  LIVING ENVIRONMENT: Lives with: lives with their family Lives in: House/apartment Stairs: No Has following equipment at home: QuadProgrammer, multimedialkEnvironmental consultant wheeled, ShowElectronics engineerab bars, and hospital bed  OCCUPATION: Disability   PLOF: Independent with household mobility with device  PATIENT GOALS reduce pain   OBJECTIVE:   DIAGNOSTIC FINDINGS:  Xray + for OA only  PATIENT SURVEYS:  FOTO 54 goal of 55    SCREENING FOR RED FLAGS: Bowel or bladder incontinence: no Spinal tumors: No Cauda equina syndrome: No Compression fracture: No Abdominal aneurysm: No  COGNITION:  Overall cognitive status: Within functional limits for tasks assessed     SENSATION: WFL Peripheral neuropathy bilat feet  MUSCLE LENGTH: Hamstrings: shortened 50% in 90/90 test bilat with "cramp" on RLE with test ThomMarcello Moorest: shortened bilat evidenced trunk flexed posture, and by inability to lay on back with BLE ext, unable to attempt test position this session.   POSTURE: rounded shoulders, forward head, decreased lumbar lordosis, increased thoracic kyphosis, and flexed trunk ; wide BOS  PALPATION: TTP at lateral knee at distal ITB insertion  LUMBAR ROM:   Active /PROM A/PROM  eval  Flexion Limited by soft tissue restriction   Extension 50% limited  Right lateral flexion 25% limitation  Left lateral flexion 25% limitation  Right rotation   Left rotation    (Blank rows = not tested)  LOWER EXTREMITY ROM:     PROM/Active  Right eval Left eval  Hip flexion 100/90 bilat  Hip extension 0d/0d bilat  Hip abduction WNL/50% limited bilat  Hip adduction    Hip internal rotation 25% limited P/AROM bilat  Hip external rotation 25% limited P/AROM bilat  Knee flexion 100/80 95/82  Knee extension 0/10 bilat  Ankle dorsiflexion 10d/5d approx bilat  Ankle plantarflexion    Ankle inversion    Ankle eversion     (Blank rows = not tested)  LOWER EXTREMITY MMT:    MMT Right eval  Left eval  Hip flexion 4- 3+  Hip extension 3 3  Hip abduction 3+ 3+  Hip adduction    Hip internal rotation 4 4  Hip external rotation 4 4-  Knee flexion 4 4-  Knee extension 4+ 4  Ankle dorsiflexion 4+ 4+  Ankle plantarflexion    Ankle inversion    Ankle eversion     (Blank rows = not tested) MMTs  given within available range  5xSTS 26sec   FUNCTIONAL TESTS:  5 times sit to stand: 26sec 2 minute walk test: 119f 10 meter walk test: 0.351m STS transfer with momentum needed without use of Ues Supine <> sit min A Rolling modI with increased time needed  GAIT: Distance walked: 20056fssistive device utilized: Quad cane large base Level of assistance: Modified independence Comments: 2MWT 150f68fide BOS throughout, minimal knee flex throughout swing, minimal hip ext at terminal stance    TODAY'S TREATMENT  Nustep seat 6 for AAROM L motion 5min57m 1 squat with narrow BOS BUE pull on ar focus on knee flex 2 x12 (12in step behind patient for target) L reverse lunge on slider with unilateral UE support with cuing for normalized L knee movement OMEGA Leg Press 55# 2x10 with cuing for eccentric cotrol with good carry over over STS without UE support 2x 10 with modcuing to maintain foot position and for trunk upright Hamstring stretch 30sec hold     PATIENT EDUCATION:  Education details: Patient was educated on diagnosis, anatomy and pathology involved, prognosis, role of PT, and was given an HEP, demonstrating exercise with proper form following verbal and tactile cues, and was given a paper hand out to continue exercise at home. Pt was educated on and agreed to plan of care.  Person educated: Patient Education method: Explanation, Demonstration, and Handouts Education comprehension: verbalized understanding and returned demonstration   HOME EXERCISE PROGRAM: STS without UE support 3x 6-10 2-3x/week Hamstring stretch 30sec hold daily AAROM knee flex sitting x12-20  3-5sec hold 2-3x/day  ASSESSMENT:  CLINICAL IMPRESSION: PT initiated therex progression for increased knee flexion mobility and general LE strength with success. Pt with improved motor control of previously completed exercises. Pt requires multimodal cuing of all therex for proper technique, with good effort throughout session. PT will continue progression as able.     OBJECTIVE IMPAIRMENTS Abnormal gait, cardiopulmonary status limiting activity, decreased activity tolerance, decreased balance, decreased coordination, decreased endurance, decreased mobility, difficulty walking, decreased ROM, decreased strength, decreased safety awareness, increased fascial restrictions, impaired perceived functional ability, increased muscle spasms, impaired flexibility, impaired sensation, impaired UE functional use, improper body mechanics, postural dysfunction, obesity, and pain.   ACTIVITY LIMITATIONS carrying, lifting, bending, standing, squatting, sleeping, stairs, transfers, bed mobility, bathing, and dressing  PARTICIPATION LIMITATIONS: meal prep, cleaning, laundry, shopping, and community activity  PERSONAL FACTORS Age, Education, Fitness, Past/current experiences, Sex, Time since onset of injury/illness/exacerbation, Transportation, and 3+ comorbidities: DM2, OA, obesity, GERD, HLD, HTN, hx of endo cancer  are also affecting patient's functional outcome.   REHAB POTENTIAL: Fair    CLINICAL DECISION MAKING: Evolving/moderate complexity  EVALUATION COMPLEXITY: Moderate   GOALS: Goals reviewed with patient? Yes  SHORT TERM GOALS: Target date: 12/12/2021  Pt will be independent with HEP in order to improve strength and balance in order to decrease fall risk and improve function at home and work.  Baseline:10/08/21 Goal status: INITIAL  LONG TERM GOALS: Target date: 01/09/2022  Patient will increase FOTO score to 45 to demonstrate predicted increase in functional mobility to complete  ADLs Baseline: 10/08/21 44  Goal status: INITIAL  2.  Pt will decrease 5TSTS by to  least 11 seconds in order to demonstrate clinically significant age matched  BLE strength  Baseline: 10/08/21 26sec Goal status: INITIAL  3.  Pt will increase 10MWT by to least 1.69m/s 48morder to demonstrate clinically significant community ambulation speed, and to demonstrate decreased fall risk   Baseline: 10/09/21  0.37ms Goal status: INITIAL  4. Pt will increase 2MWT to at least to 5376fin order to demonstrate age matched improvement in cardiopulmonary endurance and community ambulation  Baseline: 10/08/21 15018foal status: INITIAL  5.  Pt will decrease worst pain as reported on NPRS by at least 3 points in order to demonstrate clinically significant reduction in pain. Baseline: 10/08/21 10/10 Goal status: INITIAL     PLAN: PT FREQUENCY: 1-2x/week  PT DURATION: 8 weeks  PLANNED INTERVENTIONS: Therapeutic exercises, Therapeutic activity, Neuromuscular re-education, Balance training, Gait training, Patient/Family education, Self Care, Joint mobilization, Joint manipulation, Stair training, Aquatic Therapy, Dry Needling, Electrical stimulation, Spinal manipulation, Spinal mobilization, Cryotherapy, Moist heat, Traction, Ultrasound, Manual therapy, and Re-evaluation.  PLAN FOR NEXT SESSION: HEP review, DGI    CheDurwin RegesT  CheDurwin RegesT 11/14/2021, 3:13 PM   CheDurwin RegesT 11/14/2021, 3:13 PM

## 2021-11-19 ENCOUNTER — Encounter: Payer: Self-pay | Admitting: Physical Therapy

## 2021-11-19 ENCOUNTER — Ambulatory Visit: Payer: Medicare Other | Admitting: Physical Therapy

## 2021-11-19 DIAGNOSIS — G8929 Other chronic pain: Secondary | ICD-10-CM

## 2021-11-19 DIAGNOSIS — M25562 Pain in left knee: Secondary | ICD-10-CM | POA: Diagnosis not present

## 2021-11-19 DIAGNOSIS — M6281 Muscle weakness (generalized): Secondary | ICD-10-CM

## 2021-11-19 NOTE — Therapy (Signed)
OUTPATIENT PHYSICAL THERAPY SHOULDER EVALUATION   Patient Name: Christine Lawson MRN: [id number] DOB:1960-09-22, 61 y.o., female Today's Date: 11/19/2021   PT End of Session - 11/19/21 1347     Visit Number 6    Number of Visits 17    Date for PT Re-Evaluation 12/13/21    Authorization - Visit Number 6    Authorization - Number of Visits 10    Progress Note Due on Visit 10    PT Start Time 2595    PT Stop Time 1425    PT Time Calculation (min) 40 min    Activity Tolerance Patient limited by pain    Behavior During Therapy [facility] for tasks assessed/performed                 Past Medical History:  Diagnosis Date   Diabetes mellitus without complication ([place])    Endometrial adenocarcinoma ([place])    GERD (gastroesophageal reflux disease)    Hematochezia    Hyperlipidemia    Hypertension    IDA (iron deficiency anemia)    Inguinal lymphadenopathy 07/13/2015   Irritable bowel syndrome    Obesity    Osteoarthritis    Sleep apnea    Past Surgical History:  Procedure Laterality Date   ABDOMINAL HYSTERECTOMY  feb 2013   CARPAL TUNNEL RELEASE Left 03/20/2021   Procedure: CARPAL TUNNEL RELEASE ENDOSCOPIC;  Surgeon: Corky Mull, MD;  Location: [facility] ORS;  Service: Orthopedics;  Laterality: Left;   COLONOSCOPY WITH PROPOFOL N/A 08/02/2015   Procedure: COLONOSCOPY WITH PROPOFOL;  Surgeon: Manya Silvas, MD;  Location: [facility] ENDOSCOPY;  Service: Endoscopy;  Laterality: N/A;   ESOPHAGOGASTRODUODENOSCOPY (EGD) WITH PROPOFOL N/A 08/02/2015   Procedure: ESOPHAGOGASTRODUODENOSCOPY (EGD) WITH PROPOFOL;  Surgeon: Manya Silvas, MD;  Location: [facility] ENDOSCOPY;  Service: Endoscopy;  Laterality: N/A;   Patient Active Problem List   Diagnosis Date Noted   Lymphedema 01/17/2016   Venous (peripheral) insufficiency 01/17/2016   Pain in limb 01/17/2016   Iron deficiency anemia due to chronic blood loss 01/09/2016   History of endometrial cancer 08/01/2015   Endometrial adenocarcinoma  ([place]) 07/13/2015    Class: History of   Inguinal lymphadenopathy 07/13/2015   Long term current use of insulin ([place]) 12/12/2013   Microalbuminuria 12/12/2013   Diabetes ([place]) 07/28/2013   BP (high blood pressure) 07/28/2013   HLD (hyperlipidemia) 07/28/2013   Adiposity 07/28/2013   Obstructive apnea 07/28/2013   Arthritis, degenerative 07/28/2013   Apnea, sleep 07/28/2013   Thyroid nodule 07/28/2013    PCP: Tracie Harrier MD  REFERRING PROVIDER: Dorise Hiss PA  REFERRING DIAG: L knee pain   THERAPY DIAG:  Chronic pain of left knee  Muscle weakness (generalized)  Rationale for Evaluation and Treatment Rehabilitation  ONSET DATE: July 2023  SUBJECTIVE:  SUBJECTIVE STATEMENT: Pt reports 7/10 L knee pain today. Reports some compliance with HEP  PERTINENT HISTORY: Pt returns to this clinic for L knee pain after a fall onto her knee about a month ago. Has had 2 falls in her home in the past 6 months while walking in her home without her cane.  She has chronic pain of both knees reporting 7/10 pain currently in each. Worst L knee pain 9/10 best 6/10. Reports pain that is dull, but can sharp during certain movements.  Pain is worsened by standing > 32mns, walking >166ms, listing, carrying, squatting. Pt is on disability, enjoys playing cards, and puzzles.   PAIN:  Are you having pain? Yes: NPRS scale: 7/10 Pain location: lateral knee Pain description: dull ache, with occassional sharp pain Aggravating factors: standing > 3036m, walking >52m66m listing, carrying, squatting.  Relieving factors: rest  PRECAUTIONS: Fall  WEIGHT BEARING RESTRICTIONS No  FALLS:  Has patient fallen in last 6 months? Yes. Number of falls 2  LIVING ENVIRONMENT: Lives with: lives with their family Lives in:  House/apartment Stairs: No Has following equipment at home: QuadProgrammer, multimedialkEnvironmental consultant wheeled, ShowElectronics engineerab bars, and hospital bed  OCCUPATION: Disability   PLOF: Independent with household mobility with device  PATIENT GOALS reduce pain   OBJECTIVE:   DIAGNOSTIC FINDINGS:  Xray + for OA only  PATIENT SURVEYS:  FOTO 54 goal of 55    SCREENING FOR RED FLAGS: Bowel or bladder incontinence: no Spinal tumors: No Cauda equina syndrome: No Compression fracture: No Abdominal aneurysm: No  COGNITION:  Overall cognitive status: Within functional limits for tasks assessed     SENSATION: WFL Peripheral neuropathy bilat feet  MUSCLE LENGTH: Hamstrings: shortened 50% in 90/90 test bilat with "cramp" on RLE with test ThomMarcello Moorest: shortened bilat evidenced trunk flexed posture, and by inability to lay on back with BLE ext, unable to attempt test position this session.   POSTURE: rounded shoulders, forward head, decreased lumbar lordosis, increased thoracic kyphosis, and flexed trunk ; wide BOS  PALPATION: TTP at lateral knee at distal ITB insertion  LUMBAR ROM:   Active /PROM A/PROM  eval  Flexion Limited by soft tissue restriction   Extension 50% limited  Right lateral flexion 25% limitation  Left lateral flexion 25% limitation  Right rotation   Left rotation    (Blank rows = not tested)  LOWER EXTREMITY ROM:     PROM/Active  Right eval Left eval  Hip flexion 100/90 bilat  Hip extension 0d/0d bilat  Hip abduction WNL/50% limited bilat  Hip adduction    Hip internal rotation 25% limited P/AROM bilat  Hip external rotation 25% limited P/AROM bilat  Knee flexion 100/80 95/82  Knee extension 0/10 bilat  Ankle dorsiflexion 10d/5d approx bilat  Ankle plantarflexion    Ankle inversion    Ankle eversion     (Blank rows = not tested)  LOWER EXTREMITY MMT:    MMT Right eval Left eval  Hip flexion 4- 3+  Hip extension 3 3  Hip abduction 3+ 3+   Hip adduction    Hip internal rotation 4 4  Hip external rotation 4 4-  Knee flexion 4 4-  Knee extension 4+ 4  Ankle dorsiflexion 4+ 4+  Ankle plantarflexion    Ankle inversion    Ankle eversion     (Blank rows = not tested) MMTs given within available range  5xSTS 26sec   FUNCTIONAL TESTS:  5 times sit to stand:  26sec 2 minute walk test: 127f 10 meter walk test: 0.315m STS transfer with momentum needed without use of Ues Supine <> sit min A Rolling modI with increased time needed  GAIT: Distance walked: 20048fssistive device utilized: Quad cane large base Level of assistance: Modified independence Comments: 2MWT [id number] BOS throughout, minimal knee flex throughout swing, minimal hip ext at terminal stance    TODAY'S TREATMENT  Nustep seat 6 for AAROM L motion 5min48m 1 Squat with narrow BOS BUE pull on ar focus on knee flex 2 x12 (12in step behind patient for target) OMEGA Leg Press 55# 2x10 with cuing for eccentric cotrol with good carry over over Bridge 2x 10 with cuing for glute ext to prevent from hamstring cramp; better following glute set STS without UE support 2x 10 with modcuing to maintain foot position and for trunk upright Hamstring stretch 30sec hold     PATIENT EDUCATION:  Education details: Patient was educated on diagnosis, anatomy and pathology involved, prognosis, role of PT, and was given an HEP, demonstrating exercise with proper form following verbal and tactile cues, and was given a paper hand out to continue exercise at home. Pt was educated on and agreed to plan of care.  Person educated: Patient Education method: Explanation, Demonstration, and Handouts Education comprehension: verbalized understanding and returned demonstration   HOME EXERCISE PROGRAM: STS without UE support 3x 6-10 2-3x/week Hamstring stretch 30sec hold daily AAROM knee flex sitting x12-20 3-5sec hold 2-3x/day  ASSESSMENT:  CLINICAL IMPRESSION: PT continued   therex progression for increased knee flexion mobility and general LE strength with success. Pt is able to comply with all cuing for proper technique of therex, good effort throughout session. No increased pain. PT will continue progression as able.     OBJECTIVE IMPAIRMENTS Abnormal gait, cardiopulmonary status limiting activity, decreased activity tolerance, decreased balance, decreased coordination, decreased endurance, decreased mobility, difficulty walking, decreased ROM, decreased strength, decreased safety awareness, increased fascial restrictions, impaired perceived functional ability, increased muscle spasms, impaired flexibility, impaired sensation, impaired UE functional use, improper body mechanics, postural dysfunction, obesity, and pain.   ACTIVITY LIMITATIONS carrying, lifting, bending, standing, squatting, sleeping, stairs, transfers, bed mobility, bathing, and dressing  PARTICIPATION LIMITATIONS: meal prep, cleaning, laundry, shopping, and community activity  PERSONAL FACTORS Age, Education, Fitness, Past/current experiences, Sex, Time since onset of injury/illness/exacerbation, Transportation, and 3+ comorbidities: DM2, OA, obesity, GERD, HLD, HTN, hx of endo cancer  are also affecting patient's functional outcome.   REHAB POTENTIAL: Fair    CLINICAL DECISION MAKING: Evolving/moderate complexity  EVALUATION COMPLEXITY: Moderate   GOALS: Goals reviewed with patient? Yes  SHORT TERM GOALS: Target date: 12/17/2021  Pt will be independent with HEP in order to improve strength and balance in order to decrease fall risk and improve function at home and work.  Baseline:10/08/21 Goal status: INITIAL  LONG TERM GOALS: Target date: 01/14/2022  Patient will increase FOTO score to 45 to demonstrate predicted increase in functional mobility to complete ADLs Baseline: 10/08/21 44  Goal status: INITIAL  2.  Pt will decrease 5TSTS by to  least 11 seconds in order to demonstrate  clinically significant age matched  BLE strength  Baseline: 10/08/21 26sec Goal status: INITIAL  3.  Pt will increase 10MWT by to least 1.20m/s 76morder to demonstrate clinically significant community ambulation speed, and to demonstrate decreased fall risk   Baseline: 10/09/21 0.55m/s 59m status: INITIAL  4. Pt will increase 2MWT to at least to 537ft in74fer to demonstrate  age matched improvement in cardiopulmonary endurance and community ambulation  Baseline: 10/08/21 168f Goal status: INITIAL  5.  Pt will decrease worst pain as reported on NPRS by at least 3 points in order to demonstrate clinically significant reduction in pain. Baseline: 10/08/21 10/10 Goal status: INITIAL     PLAN: PT FREQUENCY: 1-2x/week  PT DURATION: 8 weeks  PLANNED INTERVENTIONS: Therapeutic exercises, Therapeutic activity, Neuromuscular re-education, Balance training, Gait training, Patient/Family education, Self Care, Joint mobilization, Joint manipulation, Stair training, Aquatic Therapy, Dry Needling, Electrical stimulation, Spinal manipulation, Spinal mobilization, Cryotherapy, Moist heat, Traction, Ultrasound, Manual therapy, and Re-evaluation.  PLAN FOR NEXT SESSION: HEP review, DGI    CDurwin RegesDPT  CDurwin Reges PT 11/19/2021, 3:15 PM   CDurwin Reges PT 11/19/2021, 3:15 PM

## 2021-11-21 ENCOUNTER — Ambulatory Visit: Payer: Medicare Other | Admitting: Physical Therapy

## 2021-11-26 ENCOUNTER — Ambulatory Visit: Payer: Medicare Other | Admitting: Physical Therapy

## 2021-11-26 DIAGNOSIS — G8929 Other chronic pain: Secondary | ICD-10-CM

## 2021-11-26 DIAGNOSIS — M6281 Muscle weakness (generalized): Secondary | ICD-10-CM

## 2021-11-26 DIAGNOSIS — M25562 Pain in left knee: Secondary | ICD-10-CM | POA: Diagnosis not present

## 2021-11-26 NOTE — Therapy (Signed)
OUTPATIENT PHYSICAL THERAPY SHOULDER EVALUATION   Patient Name: Christine Lawson MRN: [id number] DOB:[birth year], 61 y.o., female Today's Date: 11/26/2021   PT End of Session - 11/26/21 1358     Visit Number 7    Number of Visits 17    Date for PT Re-Evaluation 12/13/21    Authorization - Visit Number 7    Authorization - Number of Visits 10    Progress Note Due on Visit 10    PT Start Time 1350    PT Stop Time 1428    PT Time Calculation (min) 38 min    Activity Tolerance Patient limited by pain    Behavior During Therapy [facility] for tasks assessed/performed                  Past Medical History:  Diagnosis Date   Diabetes mellitus without complication ([place])    Endometrial adenocarcinoma ([place])    GERD (gastroesophageal reflux disease)    Hematochezia    Hyperlipidemia    Hypertension    IDA (iron deficiency anemia)    Inguinal lymphadenopathy 07/13/2015   Irritable bowel syndrome    Obesity    Osteoarthritis    Sleep apnea    Past Surgical History:  Procedure Laterality Date   ABDOMINAL HYSTERECTOMY  feb 2013   CARPAL TUNNEL RELEASE Left 03/20/2021   Procedure: CARPAL TUNNEL RELEASE ENDOSCOPIC;  Surgeon: Corky Mull, MD;  Location: [facility] ORS;  Service: Orthopedics;  Laterality: Left;   COLONOSCOPY WITH PROPOFOL N/A 08/02/2015   Procedure: COLONOSCOPY WITH PROPOFOL;  Surgeon: Manya Silvas, MD;  Location: [facility] ENDOSCOPY;  Service: Endoscopy;  Laterality: N/A;   ESOPHAGOGASTRODUODENOSCOPY (EGD) WITH PROPOFOL N/A 08/02/2015   Procedure: ESOPHAGOGASTRODUODENOSCOPY (EGD) WITH PROPOFOL;  Surgeon: Manya Silvas, MD;  Location: [facility] ENDOSCOPY;  Service: Endoscopy;  Laterality: N/A;   Patient Active Problem List   Diagnosis Date Noted   Lymphedema 01/17/2016   Venous (peripheral) insufficiency 01/17/2016   Pain in limb 01/17/2016   Iron deficiency anemia due to chronic blood loss 01/09/2016   History of endometrial cancer 08/01/2015   Endometrial adenocarcinoma  ([place]) 07/13/2015    Class: History of   Inguinal lymphadenopathy 07/13/2015   Long term current use of insulin ([place]) 12/12/2013   Microalbuminuria 12/12/2013   Diabetes ([place]) 07/28/2013   BP (high blood pressure) 07/28/2013   HLD (hyperlipidemia) 07/28/2013   Adiposity 07/28/2013   Obstructive apnea 07/28/2013   Arthritis, degenerative 07/28/2013   Apnea, sleep 07/28/2013   Thyroid nodule 07/28/2013    PCP: Tracie Harrier MD  REFERRING PROVIDER: Dorise Hiss PA  REFERRING DIAG: L knee pain   THERAPY DIAG:  Chronic pain of left knee  Muscle weakness (generalized)  Rationale for Evaluation and Treatment Rehabilitation  ONSET DATE: July 2023  SUBJECTIVE:  SUBJECTIVE STATEMENT: Pt reports 7/10 L knee pain today. Reports some compliance with HEP  PERTINENT HISTORY: Pt returns to this clinic for L knee pain after a fall onto her knee about a month ago. Has had 2 falls in her home in the past 6 months while walking in her home without her cane.  She has chronic pain of both knees reporting 7/10 pain currently in each. Worst L knee pain 9/10 best 6/10. Reports pain that is dull, but can sharp during certain movements.  Pain is worsened by standing > 35mns, walking >133ms, listing, carrying, squatting. Pt is on disability, enjoys playing cards, and puzzles.   PAIN:  Are you having pain? Yes: NPRS scale: 7/10 Pain location: lateral knee Pain description: dull ache, with occassional sharp pain Aggravating factors: standing > 3067m, walking >92m73m listing, carrying, squatting.  Relieving factors: rest  PRECAUTIONS: Fall  WEIGHT BEARING RESTRICTIONS No  FALLS:  Has patient fallen in last 6 months? Yes. Number of falls 2  LIVING ENVIRONMENT: Lives with: lives with their family Lives in:  House/apartment Stairs: No Has following equipment at home: QuadProgrammer, multimedialkEnvironmental consultant wheeled, ShowElectronics engineerab bars, and hospital bed  OCCUPATION: Disability   PLOF: Independent with household mobility with device  PATIENT GOALS reduce pain   OBJECTIVE:   DIAGNOSTIC FINDINGS:  Xray + for OA only  PATIENT SURVEYS:  FOTO 54 goal of 55    SCREENING FOR RED FLAGS: Bowel or bladder incontinence: no Spinal tumors: No Cauda equina syndrome: No Compression fracture: No Abdominal aneurysm: No  COGNITION:  Overall cognitive status: Within functional limits for tasks assessed     SENSATION: WFL Peripheral neuropathy bilat feet  MUSCLE LENGTH: Hamstrings: shortened 50% in 90/90 test bilat with "cramp" on RLE with test ThomMarcello Moorest: shortened bilat evidenced trunk flexed posture, and by inability to lay on back with BLE ext, unable to attempt test position this session.   POSTURE: rounded shoulders, forward head, decreased lumbar lordosis, increased thoracic kyphosis, and flexed trunk ; wide BOS  PALPATION: TTP at lateral knee at distal ITB insertion  LUMBAR ROM:   Active /PROM A/PROM  eval  Flexion Limited by soft tissue restriction   Extension 50% limited  Right lateral flexion 25% limitation  Left lateral flexion 25% limitation  Right rotation   Left rotation    (Blank rows = not tested)  LOWER EXTREMITY ROM:     PROM/Active  Right eval Left eval  Hip flexion 100/90 bilat  Hip extension 0d/0d bilat  Hip abduction WNL/50% limited bilat  Hip adduction    Hip internal rotation 25% limited P/AROM bilat  Hip external rotation 25% limited P/AROM bilat  Knee flexion 100/80 95/82  Knee extension 0/10 bilat  Ankle dorsiflexion 10d/5d approx bilat  Ankle plantarflexion    Ankle inversion    Ankle eversion     (Blank rows = not tested)  LOWER EXTREMITY MMT:    MMT Right eval Left eval  Hip flexion 4- 3+  Hip extension 3 3  Hip abduction 3+ 3+   Hip adduction    Hip internal rotation 4 4  Hip external rotation 4 4-  Knee flexion 4 4-  Knee extension 4+ 4  Ankle dorsiflexion 4+ 4+  Ankle plantarflexion    Ankle inversion    Ankle eversion     (Blank rows = not tested) MMTs given within available range  5xSTS 26sec   FUNCTIONAL TESTS:  5 times sit to stand:  26sec 2 minute walk test: 114f 10 meter walk test: 0.358m STS transfer with momentum needed without use of Ues Supine <> sit min A Rolling modI with increased time needed  GAIT: Distance walked: 20047fssistive device utilized: Quad cane large base Level of assistance: Modified independence Comments: 2MWT [id number] BOS throughout, minimal knee flex throughout swing, minimal hip ext at terminal stance    TODAY'S TREATMENT  Nustep seat 6 for AAROM L motion 5min3m 1 Squat with narrow BOS BUE pull on treadmill bar focus on knee flex 2 x12 (12in step behind patient for target) LLE step up onto 8in step 3x 8  OMEGA Leg Press 55# x10;65# x10 with cuing for eccentric cotrol with good carry over over STS without UE support 2x 10 with modcuing to maintain foot position and for trunk upright Hamstring stretch 30sec hold     PATIENT EDUCATION:  Education details: Patient was educated on diagnosis, anatomy and pathology involved, prognosis, role of PT, and was given an HEP, demonstrating exercise with proper form following verbal and tactile cues, and was given a paper hand out to continue exercise at home. Pt was educated on and agreed to plan of care.  Person educated: Patient Education method: Explanation, Demonstration, and Handouts Education comprehension: verbalized understanding and returned demonstration   HOME EXERCISE PROGRAM: STS without UE support 3x 6-10 2-3x/week Hamstring stretch 30sec hold daily AAROM knee flex sitting x12-20 3-5sec hold 2-3x/day  ASSESSMENT:  CLINICAL IMPRESSION: PT continued  therex progression for increased knee flexion  mobility and general LE strength with success. Pt is able to comply with all cuing for proper technique of therex, good effort throughout session. No increased pain. PT will continue progression as able.     OBJECTIVE IMPAIRMENTS Abnormal gait, cardiopulmonary status limiting activity, decreased activity tolerance, decreased balance, decreased coordination, decreased endurance, decreased mobility, difficulty walking, decreased ROM, decreased strength, decreased safety awareness, increased fascial restrictions, impaired perceived functional ability, increased muscle spasms, impaired flexibility, impaired sensation, impaired UE functional use, improper body mechanics, postural dysfunction, obesity, and pain.   ACTIVITY LIMITATIONS carrying, lifting, bending, standing, squatting, sleeping, stairs, transfers, bed mobility, bathing, and dressing  PARTICIPATION LIMITATIONS: meal prep, cleaning, laundry, shopping, and community activity  PERSONAL FACTORS Age, Education, Fitness, Past/current experiences, Sex, Time since onset of injury/illness/exacerbation, Transportation, and 3+ comorbidities: DM2, OA, obesity, GERD, HLD, HTN, hx of endo cancer  are also affecting patient's functional outcome.   REHAB POTENTIAL: Fair    CLINICAL DECISION MAKING: Evolving/moderate complexity  EVALUATION COMPLEXITY: Moderate   GOALS: Goals reviewed with patient? Yes  SHORT TERM GOALS: Target date: 12/24/2021  Pt will be independent with HEP in order to improve strength and balance in order to decrease fall risk and improve function at home and work.  Baseline:10/08/21 Goal status: INITIAL  LONG TERM GOALS: Target date: 01/21/2022  Patient will increase FOTO score to 45 to demonstrate predicted increase in functional mobility to complete ADLs Baseline: 10/08/21 44  Goal status: INITIAL  2.  Pt will decrease 5TSTS by to  least 11 seconds in order to demonstrate clinically significant age matched  BLE  strength  Baseline: 10/08/21 26sec Goal status: INITIAL  3.  Pt will increase 10MWT by to least 1.54m/s 73morder to demonstrate clinically significant community ambulation speed, and to demonstrate decreased fall risk   Baseline: 10/09/21 0.105m/s 105m status: INITIAL  4. Pt will increase 2MWT to at least to 537ft in64fer to demonstrate age matched improvement in cardiopulmonary endurance  and community ambulation  Baseline: 10/08/21 130f Goal status: INITIAL  5.  Pt will decrease worst pain as reported on NPRS by at least 3 points in order to demonstrate clinically significant reduction in pain. Baseline: 10/08/21 10/10 Goal status: INITIAL     PLAN: PT FREQUENCY: 1-2x/week  PT DURATION: 8 weeks  PLANNED INTERVENTIONS: Therapeutic exercises, Therapeutic activity, Neuromuscular re-education, Balance training, Gait training, Patient/Family education, Self Care, Joint mobilization, Joint manipulation, Stair training, Aquatic Therapy, Dry Needling, Electrical stimulation, Spinal manipulation, Spinal mobilization, Cryotherapy, Moist heat, Traction, Ultrasound, Manual therapy, and Re-evaluation.  PLAN FOR NEXT SESSION: HEP review, DGI    CDurwin RegesDPT  CDurwin Reges PT 11/26/2021, 2:24 PM   CDurwin Reges PT 11/26/2021, 2:24 PM

## 2021-11-28 ENCOUNTER — Ambulatory Visit: Payer: Medicare Other

## 2021-11-28 DIAGNOSIS — G8929 Other chronic pain: Secondary | ICD-10-CM

## 2021-11-28 DIAGNOSIS — M6281 Muscle weakness (generalized): Secondary | ICD-10-CM

## 2021-11-28 DIAGNOSIS — M25562 Pain in left knee: Secondary | ICD-10-CM | POA: Diagnosis not present

## 2021-11-28 NOTE — Therapy (Signed)
OUTPATIENT PHYSICAL THERAPY   Patient Name: Christine Lawson MRN: [id number] DOB:30-Nov-1960, 61 y.o., female Today's Date: 11/28/2021   PT End of Session - 11/28/21 1439     Visit Number 8    Number of Visits 17    Date for PT Re-Evaluation 12/13/21    Authorization Type [employer] Medicare    Progress Note Due on Visit 10    PT Start Time 1435    PT Stop Time 1515    PT Time Calculation (min) 40 min    Activity Tolerance Patient tolerated treatment well;No increased pain;Patient limited by pain    Behavior During Therapy [facility] for tasks assessed/performed                  Past Medical History:  Diagnosis Date   Diabetes mellitus without complication ([place])    Endometrial adenocarcinoma ([place])    GERD (gastroesophageal reflux disease)    Hematochezia    Hyperlipidemia    Hypertension    IDA (iron deficiency anemia)    Inguinal lymphadenopathy 07/13/2015   Irritable bowel syndrome    Obesity    Osteoarthritis    Sleep apnea    Past Surgical History:  Procedure Laterality Date   ABDOMINAL HYSTERECTOMY  feb 2013   CARPAL TUNNEL RELEASE Left 03/20/2021   Procedure: CARPAL TUNNEL RELEASE ENDOSCOPIC;  Surgeon: Corky Mull, MD;  Location: [facility] ORS;  Service: Orthopedics;  Laterality: Left;   COLONOSCOPY WITH PROPOFOL N/A 08/02/2015   Procedure: COLONOSCOPY WITH PROPOFOL;  Surgeon: Manya Silvas, MD;  Location: [facility] ENDOSCOPY;  Service: Endoscopy;  Laterality: N/A;   ESOPHAGOGASTRODUODENOSCOPY (EGD) WITH PROPOFOL N/A 08/02/2015   Procedure: ESOPHAGOGASTRODUODENOSCOPY (EGD) WITH PROPOFOL;  Surgeon: Manya Silvas, MD;  Location: [facility] ENDOSCOPY;  Service: Endoscopy;  Laterality: N/A;   Patient Active Problem List   Diagnosis Date Noted   Lymphedema 01/17/2016   Venous (peripheral) insufficiency 01/17/2016   Pain in limb 01/17/2016   Iron deficiency anemia due to chronic blood loss 01/09/2016   History of endometrial cancer 08/01/2015   Endometrial adenocarcinoma ([place])  07/13/2015    Class: History of   Inguinal lymphadenopathy 07/13/2015   Long term current use of insulin ([place]) 12/12/2013   Microalbuminuria 12/12/2013   Diabetes ([place]) 07/28/2013   BP (high blood pressure) 07/28/2013   HLD (hyperlipidemia) 07/28/2013   Adiposity 07/28/2013   Obstructive apnea 07/28/2013   Arthritis, degenerative 07/28/2013   Apnea, sleep 07/28/2013   Thyroid nodule 07/28/2013    PCP: Tracie Harrier MD  REFERRING PROVIDER: Dorise Hiss PA  REFERRING DIAG: L knee pain   THERAPY DIAG:  Chronic pain of left knee  Muscle weakness (generalized)  Rationale for Evaluation and Treatment Rehabilitation  ONSET DATE: July 2023  SUBJECTIVE:  SUBJECTIVE STATEMENT: Pt reports 7/10 L knee pain today. Reports some compliance with HEP  PERTINENT HISTORY: Pt returns to this clinic for L knee pain after a fall onto her knee about a month ago. Has had 2 falls in her home in the past 6 months while walking in her home without her cane.  She has chronic pain of both knees reporting 7/10 pain currently in each. Worst L knee pain 9/10 best 6/10. Reports pain that is dull, but can sharp during certain movements.  Pain is worsened by standing > 52mns, walking >126ms, listing, carrying, squatting. Pt is on disability, enjoys playing cards, and puzzles.   PAIN:  Are you having pain? Yes: NPRS scale: 7/10 Pain location: lateral knee Pain description: dull ache, with occassional sharp pain Aggravating factors: standing > 3064m, walking >22m55m listing, carrying, squatting.  Relieving factors: rest  PRECAUTIONS: Fall  WEIGHT BEARING RESTRICTIONS No  FALLS:  Has patient fallen in last 6 months? Yes. Number of falls 2  LIVING ENVIRONMENT: Lives with: lives with their family Lives in:  House/apartment Stairs: No Has following equipment at home: QuadProgrammer, multimedialkEnvironmental consultant wheeled, ShowElectronics engineerab bars, and hospital bed  OCCUPATION: Disability   PLOF: Independent with household mobility with device  PATIENT GOALS reduce pain   OBJECTIVE:   TODAY'S TREATMENT  Nustep seat 6 for AAROM L motion 6 mins L1 STS from plinth hands-free x10  *dyspnea recovery  AMB overground 100ft50fPC, moderate pace *dyspnea recovery  STS from plinth hands-free x10  *dyspnea recovery  AMB overground 200ft 56fC, moderate pace *dyspnea recovery  -Ascend/descend 4 stairs with BUE on railing: 1x Rt, 1x Lt  *dyspnea recovery at top before return down -Lateral stepping circles around high plinth with BUE support on plinth 3lb AW bilat *dyspnea recovery seated  -Lateral stepping circles around high plinth with BUE support on plinth 3lb AW bilat  PATIENT EDUCATION:  Education details: Patient was educated on diagnosis, anatomy and pathology involved, prognosis, role of PT, and was given an HEP, demonstrating exercise with proper form following verbal and tactile cues, and was given a paper hand out to continue exercise at home. Pt was educated on and agreed to plan of care.  Person educated: Patient Education method: Explanation, Demonstration, and Handouts Education comprehension: verbalized understanding and returned demonstration   HOME EXERCISE PROGRAM: STS without UE support 3x 6-10 2-3x/week Hamstring stretch 30sec hold daily AAROM knee flex sitting x12-20 3-5sec hold 2-3x/day  ASSESSMENT:  CLINICAL IMPRESSION: Pt able to advance recumbent cardio activity this date. Spent much of session on gait based strength and balance training. Pt is able to comply with all cuing for proper technique of therex, good effort throughout session. No increased pain. PT will continue progression as able.     OBJECTIVE IMPAIRMENTS Abnormal gait, cardiopulmonary status limiting activity,  decreased activity tolerance, decreased balance, decreased coordination, decreased endurance, decreased mobility, difficulty walking, decreased ROM, decreased strength, decreased safety awareness, increased fascial restrictions, impaired perceived functional ability, increased muscle spasms, impaired flexibility, impaired sensation, impaired UE functional use, improper body mechanics, postural dysfunction, obesity, and pain.   ACTIVITY LIMITATIONS carrying, lifting, bending, standing, squatting, sleeping, stairs, transfers, bed mobility, bathing, and dressing  PARTICIPATION LIMITATIONS: meal prep, cleaning, laundry, shopping, and community activity  PERSONAL FACTORS Age, Education, Fitness, Past/current experiences, Sex, Time since onset of injury/illness/exacerbation, Transportation, and 3+ comorbidities: DM2, OA, obesity, GERD, HLD, HTN, hx of endo cancer  are also affecting patient's functional  outcome.   REHAB POTENTIAL: Fair    CLINICAL DECISION MAKING: Evolving/moderate complexity  EVALUATION COMPLEXITY: Moderate   GOALS: Goals reviewed with patient? Yes  SHORT TERM GOALS: Target date: 12/26/2021  Pt will be independent with HEP in order to improve strength and balance in order to decrease fall risk and improve function at home and work.  Baseline:10/08/21 Goal status: INITIAL  LONG TERM GOALS: Target date: 01/23/2022  Patient will increase FOTO score to 45 to demonstrate predicted increase in functional mobility to complete ADLs Baseline: 10/08/21 44  Goal status: INITIAL  2.  Pt will decrease 5TSTS by to  least 11 seconds in order to demonstrate clinically significant age matched  BLE strength  Baseline: 10/08/21 26sec Goal status: INITIAL  3.  Pt will increase 10MWT by to least 1.60ms in order to demonstrate clinically significant community ambulation speed, and to demonstrate decreased fall risk   Baseline: 10/09/21 0.317m Goal status: INITIAL  4. Pt will increase 2MWT to  at least to [id number] order to demonstrate age matched improvement in cardiopulmonary endurance and community ambulation  Baseline: 10/08/21 [id number] status: INITIAL  5.  Pt will decrease worst pain as reported on NPRS by at least 3 points in order to demonstrate clinically significant reduction in pain. Baseline: 10/08/21 10/10 Goal status: INITIAL     PLAN: PT FREQUENCY: 1-2x/week  PT DURATION: 8 weeks  PLANNED INTERVENTIONS: Therapeutic exercises, Therapeutic activity, Neuromuscular re-education, Balance training, Gait training, Patient/Family education, Self Care, Joint mobilization, Joint manipulation, Stair training, Aquatic Therapy, Dry Needling, Electrical stimulation, Spinal manipulation, Spinal mobilization, Cryotherapy, Moist heat, Traction, Ultrasound, Manual therapy, and Re-evaluation.  PLAN FOR NEXT SESSION: Continue with current POC       Anniston Nellums C, PT 11/28/2021, 2:40 PM  2:41 PM, 11/28/21 AllaEtta Grandchild, DPT Physical Therapist - [phone])

## 2021-12-03 ENCOUNTER — Ambulatory Visit: Payer: Medicare Other | Attending: Orthopedic Surgery | Admitting: Physical Therapy

## 2021-12-03 ENCOUNTER — Encounter: Payer: Self-pay | Admitting: Physical Therapy

## 2021-12-03 DIAGNOSIS — G8929 Other chronic pain: Secondary | ICD-10-CM | POA: Insufficient documentation

## 2021-12-03 DIAGNOSIS — M25562 Pain in left knee: Secondary | ICD-10-CM | POA: Diagnosis not present

## 2021-12-03 NOTE — Therapy (Signed)
OUTPATIENT PHYSICAL THERAPY   Patient Name: Christine Lawson MRN: [id number] DOB:Feb 16, 1961, 61 y.o., female Today's Date: 12/03/2021   PT End of Session - 12/03/21 1523     Visit Number 9    Number of Visits 17    Date for PT Re-Evaluation 12/13/21    Authorization Type [employer] Medicare    Authorization - Visit Number 9    Authorization - Number of Visits 10    Progress Note Due on Visit 10    PT Start Time 1505    PT Stop Time 6979    PT Time Calculation (min) 40 min    Activity Tolerance Patient tolerated treatment well;No increased pain;Patient limited by pain    Behavior During Therapy [facility] for tasks assessed/performed                   Past Medical History:  Diagnosis Date   Diabetes mellitus without complication ([place])    Endometrial adenocarcinoma ([place])    GERD (gastroesophageal reflux disease)    Hematochezia    Hyperlipidemia    Hypertension    IDA (iron deficiency anemia)    Inguinal lymphadenopathy 07/13/2015   Irritable bowel syndrome    Obesity    Osteoarthritis    Sleep apnea    Past Surgical History:  Procedure Laterality Date   ABDOMINAL HYSTERECTOMY  feb 2013   CARPAL TUNNEL RELEASE Left 03/20/2021   Procedure: CARPAL TUNNEL RELEASE ENDOSCOPIC;  Surgeon: Corky Mull, MD;  Location: [facility] ORS;  Service: Orthopedics;  Laterality: Left;   COLONOSCOPY WITH PROPOFOL N/A 08/02/2015   Procedure: COLONOSCOPY WITH PROPOFOL;  Surgeon: Manya Silvas, MD;  Location: [facility] ENDOSCOPY;  Service: Endoscopy;  Laterality: N/A;   ESOPHAGOGASTRODUODENOSCOPY (EGD) WITH PROPOFOL N/A 08/02/2015   Procedure: ESOPHAGOGASTRODUODENOSCOPY (EGD) WITH PROPOFOL;  Surgeon: Manya Silvas, MD;  Location: [facility] ENDOSCOPY;  Service: Endoscopy;  Laterality: N/A;   Patient Active Problem List   Diagnosis Date Noted   Lymphedema 01/17/2016   Venous (peripheral) insufficiency 01/17/2016   Pain in limb 01/17/2016   Iron deficiency anemia due to chronic blood loss 01/09/2016    History of endometrial cancer 08/01/2015   Endometrial adenocarcinoma ([place]) 07/13/2015    Class: History of   Inguinal lymphadenopathy 07/13/2015   Long term current use of insulin ([place]) 12/12/2013   Microalbuminuria 12/12/2013   Diabetes ([place]) 07/28/2013   BP (high blood pressure) 07/28/2013   HLD (hyperlipidemia) 07/28/2013   Adiposity 07/28/2013   Obstructive apnea 07/28/2013   Arthritis, degenerative 07/28/2013   Apnea, sleep 07/28/2013   Thyroid nodule 07/28/2013    PCP: Tracie Harrier MD  REFERRING PROVIDER: Dorise Hiss PA  REFERRING DIAG: L knee pain   THERAPY DIAG:  Chronic pain of left knee  Rationale for Evaluation and Treatment Rehabilitation  ONSET DATE: July 2023  SUBJECTIVE:  SUBJECTIVE STATEMENT: Pt reports 7/10 L knee pain today. Reports some compliance with HEP  PERTINENT HISTORY: Pt returns to this clinic for L knee pain after a fall onto her knee about a month ago. Has had 2 falls in her home in the past 6 months while walking in her home without her cane.  She has chronic pain of both knees reporting 7/10 pain currently in each. Worst L knee pain 9/10 best 6/10. Reports pain that is dull, but can sharp during certain movements.  Pain is worsened by standing > 53mns, walking >123ms, listing, carrying, squatting. Pt is on disability, enjoys playing cards, and puzzles.   PAIN:  Are you having pain? Yes: NPRS scale: 7/10 Pain location: lateral knee Pain description: dull ache, with occassional sharp pain Aggravating factors: standing > 303m, walking >43m85m listing, carrying, squatting.  Relieving factors: rest  PRECAUTIONS: Fall  WEIGHT BEARING RESTRICTIONS No  FALLS:  Has patient fallen in last 6 months? Yes. Number of falls 2  LIVING ENVIRONMENT: Lives with:  lives with their family Lives in: House/apartment Stairs: No Has following equipment at home: QuadProgrammer, multimedialkEnvironmental consultant wheeled, ShowElectronics engineerab bars, and hospital bed  OCCUPATION: Disability   PLOF: Independent with household mobility with device  PATIENT GOALS reduce pain   OBJECTIVE:   TODAY'S TREATMENT  Nustep seat 6 for AAROM L motion 6 mins L1 STS from plinth hands-free x10  *dyspnea recovery  AMB overground 200ft21fPC,cuing for fastest speed on straight away (with knee flex) and recovery with the turn  *dyspnea recovery  X2 200ft 75f as above- lost of pace on last 50ft d93fea recovery  -Ascend/descend 4 stairs with BUE on railing: 1x Rt, 1x Lt; followed by reciprocal with unilateral UE support on unilateral handrail for ascent and BUE on bilat handrail for descent with cuing for knee flex *dyspnea recovery at top before return down STS with 2# DB overhead press with stand 2 x10 consistent cuing for foot placement *dyspnea recovery  Hamstring stretch 30sec bilat  PATIENT EDUCATION:  Education details: Patient was educated on diagnosis, anatomy and pathology involved, prognosis, role of PT, and was given an HEP, demonstrating exercise with proper form following verbal and tactile cues, and was given a paper hand out to continue exercise at home. Pt was educated on and agreed to plan of care.  Person educated: Patient Education method: Explanation, Demonstration, and Handouts Education comprehension: verbalized understanding and returned demonstration   HOME EXERCISE PROGRAM: STS without UE support 3x 6-10 2-3x/week Hamstring stretch 30sec hold daily AAROM knee flex sitting x12-20 3-5sec hold 2-3x/day  ASSESSMENT:  CLINICAL IMPRESSION: PT continued therex progression for increased muscular and cardio endurance, and LE strengthening. PT continued to encourage increased knee mobility through functional movements and gait with success. Patient is able to  comply with all cuing for proper technique of therex with good effort throughout session. No increased pain. PT will continue progression as able.     OBJECTIVE IMPAIRMENTS Abnormal gait, cardiopulmonary status limiting activity, decreased activity tolerance, decreased balance, decreased coordination, decreased endurance, decreased mobility, difficulty walking, decreased ROM, decreased strength, decreased safety awareness, increased fascial restrictions, impaired perceived functional ability, increased muscle spasms, impaired flexibility, impaired sensation, impaired UE functional use, improper body mechanics, postural dysfunction, obesity, and pain.   ACTIVITY LIMITATIONS carrying, lifting, bending, standing, squatting, sleeping, stairs, transfers, bed mobility, bathing, and dressing  PARTICIPATION LIMITATIONS: meal prep, cleaning, laundry, shopping, and community activity  PERSONAL FACTORS Age, Education,  Fitness, Past/current experiences, Sex, Time since onset of injury/illness/exacerbation, Transportation, and 3+ comorbidities: DM2, OA, obesity, GERD, HLD, HTN, hx of endo cancer  are also affecting patient's functional outcome.   REHAB POTENTIAL: Fair    CLINICAL DECISION MAKING: Evolving/moderate complexity  EVALUATION COMPLEXITY: Moderate   GOALS: Goals reviewed with patient? Yes  SHORT TERM GOALS: Target date: 12/31/2021  Pt will be independent with HEP in order to improve strength and balance in order to decrease fall risk and improve function at home and work.  Baseline:10/08/21 Goal status: INITIAL  LONG TERM GOALS: Target date: 01/28/2022  Patient will increase FOTO score to 45 to demonstrate predicted increase in functional mobility to complete ADLs Baseline: 10/08/21 44  Goal status: INITIAL  2.  Pt will decrease 5TSTS by to  least 11 seconds in order to demonstrate clinically significant age matched  BLE strength  Baseline: 10/08/21 26sec Goal status: INITIAL  3.  Pt  will increase 10MWT by to least 1.9ms in order to demonstrate clinically significant community ambulation speed, and to demonstrate decreased fall risk   Baseline: 10/09/21 0.343m Goal status: INITIAL  4. Pt will increase 2MWT to at least to [id number] order to demonstrate age matched improvement in cardiopulmonary endurance and community ambulation  Baseline: 10/08/21 [id number] status: INITIAL  5.  Pt will decrease worst pain as reported on NPRS by at least 3 points in order to demonstrate clinically significant reduction in pain. Baseline: 10/08/21 10/10 Goal status: INITIAL     PLAN: PT FREQUENCY: 1-2x/week  PT DURATION: 8 weeks  PLANNED INTERVENTIONS: Therapeutic exercises, Therapeutic activity, Neuromuscular re-education, Balance training, Gait training, Patient/Family education, Self Care, Joint mobilization, Joint manipulation, Stair training, Aquatic Therapy, Dry Needling, Electrical stimulation, Spinal manipulation, Spinal mobilization, Cryotherapy, Moist heat, Traction, Ultrasound, Manual therapy, and Re-evaluation.  PLAN FOR NEXT SESSION: Continue with current POC [place] Reges 12/03/2021, 3:54 PM  3:54 PM, 12/03/21

## 2021-12-05 ENCOUNTER — Ambulatory Visit: Payer: Medicare Other | Admitting: Physical Therapy

## 2021-12-10 ENCOUNTER — Ambulatory Visit: Payer: Medicare Other | Admitting: Physical Therapy

## 2021-12-12 ENCOUNTER — Ambulatory Visit: Payer: Medicare Other | Admitting: Physical Therapy

## 2021-12-17 ENCOUNTER — Encounter: Payer: Self-pay | Admitting: Physical Therapy

## 2021-12-17 ENCOUNTER — Ambulatory Visit: Payer: Medicare Other | Admitting: Physical Therapy

## 2021-12-17 DIAGNOSIS — M25562 Pain in left knee: Secondary | ICD-10-CM | POA: Diagnosis not present

## 2021-12-17 DIAGNOSIS — G8929 Other chronic pain: Secondary | ICD-10-CM

## 2021-12-17 NOTE — Therapy (Signed)
OUTPATIENT PHYSICAL THERAPY/ Discharge Summary Reporting Period 10/23/21 - 12/17/21   Patient Name: Christine Lawson MRN: [id number] DOB:03-29-1960, 61 y.o., female Today's Date: 12/17/2021   PT End of Session - 12/17/21 1359     Visit Number 10    Number of Visits 17    Date for PT Re-Evaluation 12/13/21    Authorization Type [employer]    Authorization - Visit Number 10    Authorization - Number of Visits 10    Progress Note Due on Visit 10    PT Start Time 4171    PT Stop Time 1430    PT Time Calculation (min) 32 min    Activity Tolerance Patient tolerated treatment well;No increased pain;Patient limited by pain    Behavior During Therapy [facility] for tasks assessed/performed                    Past Medical History:  Diagnosis Date   Diabetes mellitus without complication ([place])    Endometrial adenocarcinoma ([place])    GERD (gastroesophageal reflux disease)    Hematochezia    Hyperlipidemia    Hypertension    IDA (iron deficiency anemia)    Inguinal lymphadenopathy 07/13/2015   Irritable bowel syndrome    Obesity    Osteoarthritis    Sleep apnea    Past Surgical History:  Procedure Laterality Date   ABDOMINAL HYSTERECTOMY  feb 2013   CARPAL TUNNEL RELEASE Left 03/20/2021   Procedure: CARPAL TUNNEL RELEASE ENDOSCOPIC;  Surgeon: Corky Mull, MD;  Location: [facility] ORS;  Service: Orthopedics;  Laterality: Left;   COLONOSCOPY WITH PROPOFOL N/A 08/02/2015   Procedure: COLONOSCOPY WITH PROPOFOL;  Surgeon: Manya Silvas, MD;  Location: [facility] ENDOSCOPY;  Service: Endoscopy;  Laterality: N/A;   ESOPHAGOGASTRODUODENOSCOPY (EGD) WITH PROPOFOL N/A 08/02/2015   Procedure: ESOPHAGOGASTRODUODENOSCOPY (EGD) WITH PROPOFOL;  Surgeon: Manya Silvas, MD;  Location: [facility] ENDOSCOPY;  Service: Endoscopy;  Laterality: N/A;   Patient Active Problem List   Diagnosis Date Noted   Lymphedema 01/17/2016   Venous (peripheral) insufficiency 01/17/2016   Pain in limb 01/17/2016   Iron  deficiency anemia due to chronic blood loss 01/09/2016   History of endometrial cancer 08/01/2015   Endometrial adenocarcinoma ([place]) 07/13/2015    Class: History of   Inguinal lymphadenopathy 07/13/2015   Long term current use of insulin ([place]) 12/12/2013   Microalbuminuria 12/12/2013   Diabetes ([place]) 07/28/2013   BP (high blood pressure) 07/28/2013   HLD (hyperlipidemia) 07/28/2013   Adiposity 07/28/2013   Obstructive apnea 07/28/2013   Arthritis, degenerative 07/28/2013   Apnea, sleep 07/28/2013   Thyroid nodule 07/28/2013    PCP: Tracie Harrier MD  REFERRING PROVIDER: Dorise Hiss PA  REFERRING DIAG: L knee pain   THERAPY DIAG:  Chronic pain of left knee  Rationale for Evaluation and Treatment Rehabilitation  ONSET DATE: July 2023  SUBJECTIVE:  SUBJECTIVE STATEMENT: Pt reports 7/10 L knee pain today. Reports some compliance with HEP  PERTINENT HISTORY: Pt returns to this clinic for L knee pain after a fall onto her knee about a month ago. Has had 2 falls in her home in the past 6 months while walking in her home without her cane.  She has chronic pain of both knees reporting 7/10 pain currently in each. Worst L knee pain 9/10 best 6/10. Reports pain that is dull, but can sharp during certain movements.  Pain is worsened by standing > 65mns, walking >1109ms, listing, carrying, squatting. Pt is on disability, enjoys playing cards, and puzzles.   PAIN:  Are you having pain? Yes: NPRS scale: 7/10 Pain location: lateral knee Pain description: dull ache, with occassional sharp pain Aggravating factors: standing > 3079m, walking >33m81m listing, carrying, squatting.  Relieving factors: rest  PRECAUTIONS: Fall  WEIGHT BEARING RESTRICTIONS No  FALLS:  Has patient fallen in last 6 months?  Yes. Number of falls 2  LIVING ENVIRONMENT: Lives with: lives with their family Lives in: House/apartment Stairs: No Has following equipment at home: QuadProgrammer, multimedialkEnvironmental consultant wheeled, ShowElectronics engineerab bars, and hospital bed  OCCUPATION: Disability   PLOF: Independent with household mobility with device  PATIENT GOALS reduce pain   OBJECTIVE:   TODAY'S TREATMENT  Nustep seat 6 for AAROM L motion 6 mins L1 STS from plinth hands-free 2x5 fastest speed 2MWT overground ambulation 310ft53f reviewed the following HEP with patient with patient able to demonstrate a set of the following with min cuing for correction needed. PT educated patient on parameters of therex (how/when to inc/decrease intensity, frequency, rep/set range, stretch hold time, and purpose of therex) with verbalized understanding.  Access Code: [email or web address] with Chair Touch  - 1 x daily - 1-2 x weekly - 3 sets - 6-12 reps - Lunge with Counter Support  - 1 x daily - 1-2 x weekly - 3 sets - 10 reps - Standing Hip Abduction with Resistance at Ankles and Counter Support  - 1 x daily - 1-2 x weekly - 3 sets - 10 reps  PATIENT EDUCATION:  Education details: Patient was educated on diagnosis, anatomy and pathology involved, prognosis, role of PT, and was given an HEP, demonstrating exercise with proper form following verbal and tactile cues, and was given a paper hand out to continue exercise at home. Pt was educated on and agreed to plan of care.  Person educated: Patient Education method: Explanation, Demonstration, and Handouts Education comprehension: verbalized understanding and returned demonstration   HOME EXERCISE PROGRAM: STS without UE support 3x 6-10 2-3x/week Hamstring stretch 30sec hold daily AAROM knee flex sitting x12-20 3-5sec hold 2-3x/day  ASSESSMENT:  CLINICAL IMPRESSION: PT reassessed goals this session where patient has met strength and subjective function goals; and made progress  toward endurance walking goals to safely d/c formal PT to HEP to continue progression of increased endurance progression. Patient is able to demonstrate and verbalize understanding of all HEP recommendations with minimal corrections needed. Pt given clinic contact info should further questions or concerns arise. Pt to d/c PT.       OBJECTIVE IMPAIRMENTS Abnormal gait, cardiopulmonary status limiting activity, decreased activity tolerance, decreased balance, decreased coordination, decreased endurance, decreased mobility, difficulty walking, decreased ROM, decreased strength, decreased safety awareness, increased fascial restrictions, impaired perceived functional ability, increased muscle spasms, impaired flexibility, impaired sensation, impaired UE functional use, improper body mechanics, postural dysfunction, obesity, and pain.  ACTIVITY LIMITATIONS carrying, lifting, bending, standing, squatting, sleeping, stairs, transfers, bed mobility, bathing, and dressing  PARTICIPATION LIMITATIONS: meal prep, cleaning, laundry, shopping, and community activity  PERSONAL FACTORS Age, Education, Fitness, Past/current experiences, Sex, Time since onset of injury/illness/exacerbation, Transportation, and 3+ comorbidities: DM2, OA, obesity, GERD, HLD, HTN, hx of endo cancer  are also affecting patient's functional outcome.   REHAB POTENTIAL: Fair    CLINICAL DECISION MAKING: Evolving/moderate complexity  EVALUATION COMPLEXITY: Moderate   GOALS: Goals reviewed with patient? Yes  SHORT TERM GOALS: Target date: 01/14/2022  Pt will be independent with HEP in order to improve strength and balance in order to decrease fall risk and improve function at home and work.  Baseline:10/08/21 Goal status: INITIAL  LONG TERM GOALS: Target date: 02/11/2022  Patient will increase FOTO score to 55 to demonstrate predicted increase in functional mobility to complete ADLs Baseline: 10/08/21 44 ; 12/17/21 58 Goal  status: INITIAL  2.  Pt will decrease 5TSTS by to  least 11 seconds in order to demonstrate clinically significant age matched  BLE strength  Baseline: 10/08/21 26sec; 12/17/21 11sec Goal status: achieved   3.  Pt will increase 10MWT by to least 1.60ms in order to demonstrate clinically significant community ambulation speed, and to demonstrate decreased fall risk   Baseline: 10/09/21 0.326m; 12/17/21 0.8376mGoal status: ongoing  4. Pt will increase 2MWT to at least to [id number] order to demonstrate age matched improvement in cardiopulmonary endurance and community ambulation  Baseline: 10/08/21 150ft39f/17/23 310ft 46f status: ongoing  5.  Pt will decrease worst pain as reported on NPRS by at least 3 points in order to demonstrate clinically significant reduction in pain. Baseline: 10/08/21 10/10; 12/17/21 7/10 Goal status: ACHIEVED      PLAN: PT FREQUENCY: 1-2x/week  PT DURATION: 8 weeks  PLANNED INTERVENTIONS: Therapeutic exercises, Therapeutic activity, Neuromuscular re-education, Balance training, Gait training, Patient/Family education, Self Care, Joint mobilization, Joint manipulation, Stair training, Aquatic Therapy, Dry Needling, Electrical stimulation, Spinal manipulation, Spinal mobilization, Cryotherapy, Moist heat, Traction, Ultrasound, Manual therapy, and Re-evaluation.  PLAN FOR NEXT SESSION: Continue with current POC      ChelseDurwin RegeshelseDurwin Reges0/17/2023, 2:51 PM  2:51 PM, 12/17/21

## 2022-01-01 ENCOUNTER — Ambulatory Visit: Payer: Medicare Other | Attending: Orthopedic Surgery | Admitting: Physical Therapy

## 2022-01-01 DIAGNOSIS — G8929 Other chronic pain: Secondary | ICD-10-CM | POA: Diagnosis present

## 2022-01-01 DIAGNOSIS — M6281 Muscle weakness (generalized): Secondary | ICD-10-CM | POA: Diagnosis present

## 2022-01-01 DIAGNOSIS — M25612 Stiffness of left shoulder, not elsewhere classified: Secondary | ICD-10-CM | POA: Diagnosis present

## 2022-01-01 DIAGNOSIS — M25512 Pain in left shoulder: Secondary | ICD-10-CM | POA: Insufficient documentation

## 2022-01-01 NOTE — Therapy (Signed)
OUTPATIENT PHYSICAL THERAPY SHOULDER EVALUATION   Patient Name: Christine Lawson MRN: [id number] DOB:1960-04-19, 61 y.o., female Today's Date: 01/02/2022   PT End of Session - 01/01/22 1448     Visit Number 1 (P)     Number of Visits 17 (P)     Date for PT Re-Evaluation 02/28/22 (P)     Authorization - Visit Number 1 (P)     Authorization - Number of Visits 10 (P)     Progress Note Due on Visit 10 (P)     PT Start Time 1438 (P)     PT Stop Time 1515 (P)     PT Time Calculation (min) 37 min (P)     Activity Tolerance Patient tolerated treatment well;No increased pain;Patient limited by pain (P)     Behavior During Therapy [facility] for tasks assessed/performed (P)              Past Medical History:  Diagnosis Date   Diabetes mellitus without complication ([place])    Endometrial adenocarcinoma ([place])    GERD (gastroesophageal reflux disease)    Hematochezia    Hyperlipidemia    Hypertension    IDA (iron deficiency anemia)    Inguinal lymphadenopathy 07/13/2015   Irritable bowel syndrome    Obesity    Osteoarthritis    Sleep apnea    Past Surgical History:  Procedure Laterality Date   ABDOMINAL HYSTERECTOMY  feb 2013   CARPAL TUNNEL RELEASE Left 03/20/2021   Procedure: CARPAL TUNNEL RELEASE ENDOSCOPIC;  Surgeon: Corky Mull, MD;  Location: [facility] ORS;  Service: Orthopedics;  Laterality: Left;   COLONOSCOPY WITH PROPOFOL N/A 08/02/2015   Procedure: COLONOSCOPY WITH PROPOFOL;  Surgeon: Manya Silvas, MD;  Location: [facility] ENDOSCOPY;  Service: Endoscopy;  Laterality: N/A;   ESOPHAGOGASTRODUODENOSCOPY (EGD) WITH PROPOFOL N/A 08/02/2015   Procedure: ESOPHAGOGASTRODUODENOSCOPY (EGD) WITH PROPOFOL;  Surgeon: Manya Silvas, MD;  Location: [facility] ENDOSCOPY;  Service: Endoscopy;  Laterality: N/A;   Patient Active Problem List   Diagnosis Date Noted   Lymphedema 01/17/2016   Venous (peripheral) insufficiency 01/17/2016   Pain in limb 01/17/2016   Iron deficiency anemia due to chronic  blood loss 01/09/2016   History of endometrial cancer 08/01/2015   Endometrial adenocarcinoma ([place]) 07/13/2015    Class: History of   Inguinal lymphadenopathy 07/13/2015   Long term current use of insulin ([place]) 12/12/2013   Microalbuminuria 12/12/2013   Diabetes ([place]) 07/28/2013   BP (high blood pressure) 07/28/2013   HLD (hyperlipidemia) 07/28/2013   Adiposity 07/28/2013   Obstructive apnea 07/28/2013   Arthritis, degenerative 07/28/2013   Apnea, sleep 07/28/2013   Thyroid nodule 07/28/2013    PCP: Ginette Pitman MD  REFERRING PROVIDER: Arvella Nigh PA  REFERRING DIAG: L shoulder pain   THERAPY DIAG:  Chronic left shoulder pain - Plan: PT plan of care cert/re-cert  Stiffness of left shoulder, not elsewhere classified - Plan: PT plan of care cert/re-cert  Rationale for Evaluation and Treatment: Rehabilitation  ONSET DATE: unable to specify  SUBJECTIVE:  SUBJECTIVE STATEMENT:   PERTINENT HISTORY: Pt returns to this clinic for evaluation of L shoulder pain. Ceased past PT for the shoulder after 4 visits d/t increased knee pain becoming more of an issue she preferred to be seen for. Her L shoulder pain has been the same since June 2023. Reports her pain is along the top anterior part of the shoulder. Reports current 6/10 shoulder pain; worst 8/10; best 5/10. Pain is increased by lifting, carrying, and overhead reach. Has some trouble pushing up from a chair and pushing her doors open. Pain is alleviated by rest. Patient is unemployed, enjoys playing games with her sister, reading/writing in her Bible, and going to church. Pt denies N/V, B&B changes, unexplained weight fluctuation, saddle paresthesia, fever, night sweats, or unrelenting night pain at this time.   PAIN:  Are you having pain? Yes: NPRS scale:  6/10 Pain location: L anterior/ superior shoulder Pain description: tight tension Aggravating factors: lifting, carrying, and overhead reach. Has some trouble pushing up from a chair and pushing her doors open.  Relieving factors: relaxing  PRECAUTIONS: None  WEIGHT BEARING RESTRICTIONS: No  FALLS:  Has patient fallen in last 6 months? Yes. Number of falls 1  LIVING ENVIRONMENT: Lives with: lives with their family Lives in: House/apartment Stairs: No Has following equipment at home: Single point cane  OCCUPATION: unemployeed  PLOF: Independent with household mobility with device  PATIENT GOALS:Increase my shoulder motion and strength  OBJECTIVE:   DIAGNOSTIC FINDINGS:  XRAY  PATIENT SURVEYS:  FOTO with goal of  COGNITION: Overall cognitive status: Within functional limits for tasks assessed     SENSATION: WFL  POSTURE: Wide BOS, upper crossed  UPPER EXTREMITY ROM:   Active / PassiveROM Right eval Left eval  Shoulder flexion 138/143 126/130  Shoulder extension 44/not tested 41/not tested  Shoulder abduction 118/142 108/130  Shoulder adduction    Shoulder internal rotation L1/90 L1/82  Shoulder external rotation (apleys) C4/82 C2/80  Elbow flexion WNL WNL  Elbow extension WNL WNL  Wrist flexion    Wrist extension    Wrist ulnar deviation    Wrist radial deviation    Wrist pronation    Wrist supination    (Blank rows = not tested)  UPPER EXTREMITY MMT:  MMT Right eval Left eval  Shoulder flexion 5 4+  Shoulder extension 5 5  Shoulder abduction 5 4+  Shoulder adduction    Shoulder internal rotation 4+ 4-  Shoulder external rotation 4+ 4+  Middle trapezius 2- 2-  Lower trapezius 2- 2-  Elbow flexion 5 5  Elbow extension 5 5  Wrist flexion    Wrist extension    Wrist ulnar deviation    Wrist radial deviation    Wrist pronation    Wrist supination    Grip strength (lbs)    (Blank rows = not tested)  SHOULDER SPECIAL TESTS: Impingement  tests: Neer impingement test: positive , Hawkins/Kennedy impingement test: positive , and Painful arc test: positive  impingement tests positive bilat with more pain with testing on RUE SLAP lesions: Crank test: negative and Biceps load test: negative  Rotator cuff assessment: Drop arm test: positive , Empty can test: positive , Full can test: negative, External rotation lag sign: negative, Internal rotation lag sign: positive , Gerber lift off test: negative, and Infraspinatus test: positive    JOINT MOBILITY TESTING:  Guarding throughout  PALPATION:  Concordant pain sign to subacromial space, supraspinatus and infraspinatus insertion    TODAY'S TREATMENT:  DATE: 01/01/22 PT reviewed the following HEP with patient with patient able to demonstrate a set of the following with min cuing for correction needed. PT educated patient on parameters of therex (how/when to inc/decrease intensity, frequency, rep/set range, stretch hold time, and purpose of therex) with verbalized understanding.   Access Code: [email or web address] - Seated Shoulder Flexion AAROM with Pulley Behind  - 1-3 x daily - 7 x weekly - 12-20 reps - Seated Shoulder Abduction AAROM with Pulley Behind  - 1-3 x daily - 7 x weekly - 12-20 reps - Seated Scapular Retraction  - 8 x daily - 7 x weekly - 12-20 reps   PATIENT EDUCATION: Education details: Patient was educated on diagnosis, anatomy and pathology involved, prognosis, role of PT, and was given an HEP, demonstrating exercise with proper form following verbal and tactile cues, and was given a paper hand out to continue exercise at home. Pt was educated on and agreed to plan of care.  Person educated: Patient Education method: Explanation, Demonstration, and Verbal cues Education comprehension: verbalized understanding, returned demonstration, and verbal  cues required  HOME EXERCISE PROGRAM: Access Code: [email or web address]  - Seated Shoulder Flexion AAROM with Pulley Behind  - 1-3 x daily - 7 x weekly - 12-20 reps - Seated Shoulder Abduction AAROM with Pulley Behind  - 1-3 x daily - 7 x weekly - 12-20 reps - Seated Scapular Retraction  - 8 x daily - 7 x weekly - 12-20 reps  ASSESSMENT:  CLINICAL IMPRESSION: Patient is a 61 y.o. female who was seen today for physical therapy evaluation and treatment for L shoulder pain. Patient with signs and symptoms of subacromial impingement with suprascapularis involvement; with infraspinatus weakness and pain as well. Patient with evident subacromial impingement and RTC weakness of R shoulder as well, less painful to patient. Impairments in decreased shoulder AROM/PROM, decreased RTC strength, decreased periscapular strength, decreased activity tolerance, postural impairments, gait abnormalities and pain. Activity limitations in overhead reaching, lifting, carrying, pushing, pulling; inhibiting full participation in self care ADLs. Would benefit from skilled PT to address above deficits and promote optimal return to PLOF.   OBJECTIVE IMPAIRMENTS: Abnormal gait, decreased activity tolerance, decreased endurance, decreased mobility, difficulty walking, decreased ROM, decreased strength, increased fascial restrictions, impaired perceived functional ability, increased muscle spasms, impaired flexibility, impaired sensation, impaired tone, impaired UE functional use, improper body mechanics, postural dysfunction, and pain.   ACTIVITY LIMITATIONS: carrying, lifting, sitting, standing, squatting, transfers, bed mobility, bathing, dressing, and reach over head  PARTICIPATION LIMITATIONS: cleaning, driving, shopping, community activity, and yard work  PERSONAL FACTORS: Age, Education, Fitness, Past/current experiences, Sex, Social background, Time since onset of injury/illness/exacerbation, Transportation, and 3+  comorbidities: BP, HLD, DM, obesity, chronic pain  are also affecting patient's functional outcome.   REHAB POTENTIAL: Good  CLINICAL DECISION MAKING: Evolving/moderate complexity  EVALUATION COMPLEXITY: Moderate   GOALS: Goals reviewed with patient? Yes  SHORT TERM GOALS: Target date: 01/30/2022  (Remove Blue Hyperlink)  Pt will be independent with HEP in order to improve strength and balance in order to decrease fall risk and improve function at home and work.  Baseline: HEP given Goal status: INITIAL   LONG TERM GOALS: Target date: 02/27/2022  (Remove Blue Hyperlink)  Pt will decrease worst pain as reported on NPRS by at least 3 points in order to demonstrate clinically significant reduction in pain.  Baseline: 8/10 Goal status: INITIAL  2.  Patient will increase FOTO score to 58 to demonstrate predicted increase in functional mobility to  complete ADLs  Baseline: 50 Goal status: INITIAL  3.  Patient will demonstrate bilat overhead flex of 140d in order to complete overhead reaching ADLs Baseline: R/L AROM 138/126 Goal status: INITIAL  4.  Patient will be able to place 15# item on a shelf overhead to demonstrate ability to place groceries in cupboard/fridge Baseline: unable to lift any amount overhead Goal status: INITIAL  5.  Pt will be able to carry 10# in left hand for at least 139f in order to demonstrate carrying items into her home Baseline: unable to carry  Goal status: INITIAL   PLAN:  PT FREQUENCY: 1-2x/week  PT DURATION: 12 weeks  PLANNED INTERVENTIONS: Therapeutic exercises, Therapeutic activity, Neuromuscular re-education, Balance training, Gait training, Patient/Family education, Self Care, Joint mobilization, Joint manipulation, Stair training, Vestibular training, DME instructions, Dry Needling, Electrical stimulation, Spinal manipulation, Spinal mobilization, Cryotherapy, and Moist heat  PLAN FOR NEXT SESSION:L shoulder mobility, scapulohumeral  rhythm  CDurwin RegesDPT  CDurwin Reges PT 01/02/2022, 10:06 AM

## 2022-01-02 ENCOUNTER — Encounter: Payer: Self-pay | Admitting: Physical Therapy

## 2022-01-06 ENCOUNTER — Encounter: Payer: Medicare Other | Admitting: Physical Therapy

## 2022-01-08 ENCOUNTER — Encounter: Payer: Medicare Other | Admitting: Physical Therapy

## 2022-01-08 ENCOUNTER — Encounter: Payer: Self-pay | Admitting: Physical Therapy

## 2022-01-08 ENCOUNTER — Ambulatory Visit: Payer: Medicare Other | Admitting: Physical Therapy

## 2022-01-08 DIAGNOSIS — M6281 Muscle weakness (generalized): Secondary | ICD-10-CM

## 2022-01-08 DIAGNOSIS — M25512 Pain in left shoulder: Secondary | ICD-10-CM | POA: Diagnosis not present

## 2022-01-08 DIAGNOSIS — M25612 Stiffness of left shoulder, not elsewhere classified: Secondary | ICD-10-CM

## 2022-01-08 DIAGNOSIS — G8929 Other chronic pain: Secondary | ICD-10-CM

## 2022-01-08 NOTE — Therapy (Signed)
OUTPATIENT PHYSICAL THERAPY SHOULDER EVALUATION   Patient Name: Christine Lawson MRN: [id number] DOB:1960-06-29, 61 y.o., female Today's Date: 01/10/2022   PT End of Session - 01/10/22 0829     Visit Number 2    Number of Visits [place] [employer]    Authorization - Visit Number 2    Authorization - Number of Visits 10    Progress Note Due on Visit 10    PT Start Time 4801    PT Stop Time 6553    PT Time Calculation (min) 40 min    Activity Tolerance Patient tolerated treatment well;No increased pain    Behavior During Therapy WFL for tasks assessed/performed               Past Medical History:  Diagnosis Date   Diabetes mellitus without complication ([place])    Endometrial adenocarcinoma ([place])    GERD (gastroesophageal reflux disease)    Hematochezia    Hyperlipidemia    Hypertension    IDA (iron deficiency anemia)    Inguinal lymphadenopathy 07/13/2015   Irritable bowel syndrome    Obesity    Osteoarthritis    Sleep apnea    Past Surgical History:  Procedure Laterality Date   ABDOMINAL HYSTERECTOMY  feb 2013   CARPAL TUNNEL RELEASE Left 03/20/2021   Procedure: CARPAL TUNNEL RELEASE ENDOSCOPIC;  Surgeon: Corky Mull, MD;  Location: [facility] ORS;  Service: Orthopedics;  Laterality: Left;   COLONOSCOPY WITH PROPOFOL N/A 08/02/2015   Procedure: COLONOSCOPY WITH PROPOFOL;  Surgeon: Manya Silvas, MD;  Location: [facility] ENDOSCOPY;  Service: Endoscopy;  Laterality: N/A;   ESOPHAGOGASTRODUODENOSCOPY (EGD) WITH PROPOFOL N/A 08/02/2015   Procedure: ESOPHAGOGASTRODUODENOSCOPY (EGD) WITH PROPOFOL;  Surgeon: Manya Silvas, MD;  Location: [facility] ENDOSCOPY;  Service: Endoscopy;  Laterality: N/A;   Patient Active Problem List   Diagnosis Date Noted   Lymphedema 01/17/2016   Venous (peripheral) insufficiency 01/17/2016   Pain in limb 01/17/2016   Iron deficiency anemia due to chronic blood loss 01/09/2016   History of endometrial cancer 08/01/2015    Endometrial adenocarcinoma ([place]) 07/13/2015    Class: History of   Inguinal lymphadenopathy 07/13/2015   Long term current use of insulin ([place]) 12/12/2013   Microalbuminuria 12/12/2013   Diabetes ([place]) 07/28/2013   BP (high blood pressure) 07/28/2013   HLD (hyperlipidemia) 07/28/2013   Adiposity 07/28/2013   Obstructive apnea 07/28/2013   Arthritis, degenerative 07/28/2013   Apnea, sleep 07/28/2013   Thyroid nodule 07/28/2013    PCP: Ginette Pitman MD  REFERRING PROVIDER: Arvella Nigh PA  REFERRING DIAG: L shoulder pain   THERAPY DIAG:  Chronic left shoulder pain  Stiffness of left shoulder, not elsewhere classified  Muscle weakness (generalized)  Rationale for Evaluation and Treatment: Rehabilitation  ONSET DATE: unable to specify  SUBJECTIVE:  SUBJECTIVE STATEMENT: Pt reports 7/10 pain upon presentation today. Pt reports not doing much earlier today.  PERTINENT HISTORY: Pt returns to this clinic for evaluation of L shoulder pain. Ceased past PT for the shoulder after 4 visits d/t increased knee pain becoming more of an issue she preferred to be seen for. Her L shoulder pain has been the same since June 2023. Reports her pain is along the top anterior part of the shoulder. Reports current 6/10 shoulder pain; worst 8/10; best 5/10. Pain is increased by lifting, carrying, and overhead reach. Has some trouble pushing up from a chair and pushing her doors open. Pain is alleviated by rest. Patient is unemployed, enjoys playing games with her sister, reading/writing in her Bible, and going to church. Pt denies N/V, B&B changes, unexplained weight fluctuation, saddle paresthesia, fever, night sweats, or unrelenting night pain at this time.   PAIN:  Are you having pain? Yes: NPRS scale: 6/10 Pain location: L anterior/  superior shoulder Pain description: tight tension Aggravating factors: lifting, carrying, and overhead reach. Has some trouble pushing up from a chair and pushing her doors open.  Relieving factors: relaxing  PRECAUTIONS: None  WEIGHT BEARING RESTRICTIONS: No  FALLS:  Has patient fallen in last 6 months? Yes. Number of falls 1  LIVING ENVIRONMENT: Lives with: lives with their family Lives in: House/apartment Stairs: No Has following equipment at home: Single point cane  OCCUPATION: unemployeed  PLOF: Independent with household mobility with device  PATIENT GOALS:Increase my shoulder motion and strength  OBJECTIVE:   DIAGNOSTIC FINDINGS:  XRAY  PATIENT SURVEYS:  FOTO with goal of  COGNITION: Overall cognitive status: Within functional limits for tasks assessed     SENSATION: WFL  POSTURE: Wide BOS, upper crossed  UPPER EXTREMITY ROM:   Active / PassiveROM Right eval Left eval  Shoulder flexion 138/143 126/130  Shoulder extension 44/not tested 41/not tested  Shoulder abduction 118/142 108/130  Shoulder adduction    Shoulder internal rotation L1/90 L1/82  Shoulder external rotation (apleys) C4/82 C2/80  Elbow flexion WNL WNL  Elbow extension WNL WNL  Wrist flexion    Wrist extension    Wrist ulnar deviation    Wrist radial deviation    Wrist pronation    Wrist supination    (Blank rows = not tested)  UPPER EXTREMITY MMT:  MMT Right eval Left eval  Shoulder flexion 5 4+  Shoulder extension 5 5  Shoulder abduction 5 4+  Shoulder adduction    Shoulder internal rotation 4+ 4-  Shoulder external rotation 4+ 4+  Middle trapezius 2- 2-  Lower trapezius 2- 2-  Elbow flexion 5 5  Elbow extension 5 5  Wrist flexion    Wrist extension    Wrist ulnar deviation    Wrist radial deviation    Wrist pronation    Wrist supination    Grip strength (lbs)    (Blank rows = not tested)  SHOULDER SPECIAL TESTS: Impingement tests: Neer impingement test:  positive , Hawkins/Kennedy impingement test: positive , and Painful arc test: positive  impingement tests positive bilat with more pain with testing on RUE SLAP lesions: Crank test: negative and Biceps load test: negative  Rotator cuff assessment: Drop arm test: positive , Empty can test: positive , Full can test: negative, External rotation lag sign: negative, Internal rotation lag sign: positive , Gerber lift off test: negative, and Infraspinatus test: positive    JOINT MOBILITY TESTING:  Guarding throughout  PALPATION:  Concordant pain sign to  subacromial space, supraspinatus and infraspinatus insertion    TODAY'S TREATMENT:                                                                                                                                          Nu-step Level 3, seat 8, UE 11, 6 minutes Seated Pulleys 20 reps bilateral, pt requires cueing for upright posture in the chair and to hold the stretch for L shoulder flexed maximally Seated theraband exercises: ER: 1 set of 10 reps (only on left), pt cued to keep elbows tight to abdomen and eccentric control (counting to 3) IR: 1 set of 10 reps (only on left), pt cued to keep elbows tight to abdomen and eccentric control (counting to 3)  Shoulder ext rows, 1 set, 10 reps, cued for upright posture and  Shoulder rows, 1 set, 10 reps, pt cued for bent elbows and scapular retraction Horizontal abduction only left, pt cued to keep elbow extended Horizontal adduction only left, pt cued for eccentric control PVC pipe AAROM shoulder flexion bilateral, pt requires cueing for hold of stretch at the top Standing I's, T's, Y's 3# 5 reps through, pt require demonstration and verbal cueing for proper execution of movement  PATIENT EDUCATION: Education details: Patient was educated on diagnosis, anatomy and pathology involved, prognosis, role of PT, and was given an HEP, demonstrating exercise with proper form following verbal and tactile cues,  and was given a paper hand out to continue exercise at home. Pt was educated on and agreed to plan of care.  Person educated: Patient Education method: Explanation, Demonstration, and Verbal cues Education comprehension: verbalized understanding, returned demonstration, and verbal cues required  HOME EXERCISE PROGRAM: Access Code: [email or web address]  - Seated Shoulder Flexion AAROM with Pulley Behind  - 1-3 x daily - 7 x weekly - 12-20 reps - Seated Shoulder Abduction AAROM with Pulley Behind  - 1-3 x daily - 7 x weekly - 12-20 reps - Seated Scapular Retraction  - 8 x daily - 7 x weekly - 12-20 reps  ASSESSMENT:  CLINICAL IMPRESSION: PT continued therapeutic exercise to improve resting posture, strengthen para scapular musculature, and increase mobility of left shoulder. Pt able to comply with all exercises prescribed during today's session with no increased pain. Pt requires resting break about every two exercises. Patient will continue to benefit from skilled physical therapy to improve left shoulder pain free ROM and improve posture to address impingement and to improve QOL.    OBJECTIVE IMPAIRMENTS: Abnormal gait, decreased activity tolerance, decreased endurance, decreased mobility, difficulty walking, decreased ROM, decreased strength, increased fascial restrictions, impaired perceived functional ability, increased muscle spasms, impaired flexibility, impaired sensation, impaired tone, impaired UE functional use, improper body mechanics, postural dysfunction, and pain.   ACTIVITY LIMITATIONS: carrying, lifting, sitting, standing, squatting, transfers, bed mobility, bathing, dressing, and reach over head  PARTICIPATION LIMITATIONS: cleaning, driving, shopping, community activity, and yard  work  PERSONAL FACTORS: Age, Education, Fitness, Past/current experiences, Sex, Social background, Time since onset of injury/illness/exacerbation, Transportation, and 3+ comorbidities: BP, HLD, DM, obesity,  chronic pain  are also affecting patient's functional outcome.   REHAB POTENTIAL: Good  CLINICAL DECISION MAKING: Evolving/moderate complexity  EVALUATION COMPLEXITY: Moderate   GOALS: Goals reviewed with patient? Yes  SHORT TERM GOALS: Target date: 02/07/2022  (Remove Blue Hyperlink)  Pt will be independent with HEP in order to improve strength and balance in order to decrease fall risk and improve function at home and work.  Baseline: HEP given Goal status: INITIAL   LONG TERM GOALS: Target date: 03/07/2022  (Remove Blue Hyperlink)  Pt will decrease worst pain as reported on NPRS by at least 3 points in order to demonstrate clinically significant reduction in pain.  Baseline: 8/10 Goal status: INITIAL  2.  Patient will increase FOTO score to 58 to demonstrate predicted increase in functional mobility to complete ADLs  Baseline: 50 Goal status: INITIAL  3.  Patient will demonstrate bilat overhead flex of 140d in order to complete overhead reaching ADLs Baseline: R/L AROM 138/126 Goal status: INITIAL  4.  Patient will be able to place 15# item on a shelf overhead to demonstrate ability to place groceries in cupboard/fridge Baseline: unable to lift any amount overhead Goal status: INITIAL  5.  Pt will be able to carry 10# in left hand for at least 185f in order to demonstrate carrying items into her home Baseline: unable to carry  Goal status: INITIAL   PLAN:  PT FREQUENCY: 1-2x/week  PT DURATION: 12 weeks  PLANNED INTERVENTIONS: Therapeutic exercises, Therapeutic activity, Neuromuscular re-education, Balance training, Gait training, Patient/Family education, Self Care, Joint mobilization, Joint manipulation, Stair training, Vestibular training, DME instructions, Dry Needling, Electrical stimulation, Spinal manipulation, Spinal mobilization, Cryotherapy, and Moist heat  PLAN FOR NEXT SESSION:L shoulder mobility, scapulohumeral rhythm  JMayo Ao SPT  CDurwin RegesDPT CDurwin Reges PT 01/10/2022, 8:30 AM

## 2022-01-13 ENCOUNTER — Encounter: Payer: Medicare Other | Admitting: Physical Therapy

## 2022-01-13 ENCOUNTER — Encounter: Payer: Self-pay | Admitting: Physical Therapy

## 2022-01-13 ENCOUNTER — Ambulatory Visit: Payer: Medicare Other | Admitting: Physical Therapy

## 2022-01-13 DIAGNOSIS — M25512 Pain in left shoulder: Secondary | ICD-10-CM | POA: Diagnosis not present

## 2022-01-13 DIAGNOSIS — G8929 Other chronic pain: Secondary | ICD-10-CM

## 2022-01-13 NOTE — Therapy (Signed)
OUTPATIENT PHYSICAL THERAPY SHOULDER EVALUATION   Patient Name: Christine Lawson MRN: [id number] DOB:04-16-1960, 61 y.o., female Today's Date: 01/13/2022   PT End of Session - 01/13/22 1543     Visit Number 3    Number of Visits 17    Date for PT Re-Evaluation 12/13/21    Authorization Type [employer]    Authorization - Visit Number 3    Authorization - Number of Visits 10    Progress Note Due on Visit 10    PT Start Time 1430    PT Stop Time 1510    PT Time Calculation (min) 40 min    Activity Tolerance Patient tolerated treatment well;No increased pain    Behavior During Therapy WFL for tasks assessed/performed                Past Medical History:  Diagnosis Date   Diabetes mellitus without complication ([place])    Endometrial adenocarcinoma ([place])    GERD (gastroesophageal reflux disease)    Hematochezia    Hyperlipidemia    Hypertension    IDA (iron deficiency anemia)    Inguinal lymphadenopathy 07/13/2015   Irritable bowel syndrome    Obesity    Osteoarthritis    Sleep apnea    Past Surgical History:  Procedure Laterality Date   ABDOMINAL HYSTERECTOMY  feb 2013   CARPAL TUNNEL RELEASE Left 03/20/2021   Procedure: CARPAL TUNNEL RELEASE ENDOSCOPIC;  Surgeon: Corky Mull, MD;  Location: [facility] ORS;  Service: Orthopedics;  Laterality: Left;   COLONOSCOPY WITH PROPOFOL N/A 08/02/2015   Procedure: COLONOSCOPY WITH PROPOFOL;  Surgeon: Manya Silvas, MD;  Location[facility] ENDOSCOPY;  Service: Endoscopy;  Laterality: N/A;   ESOPHAGOGASTRODUODENOSCOPY (EGD) WITH PROPOFOL N/A 08/02/2015   Procedure: ESOPHAGOGASTRODUODENOSCOPY (EGD) WITH PROPOFOL;  Surgeon: Manya Silvas, MD;  Location: [facility] ENDOSCOPY;  Service: Endoscopy;  Laterality: N/A;   Patient Active Problem List   Diagnosis Date Noted   Lymphedema 01/17/2016   Venous (peripheral) insufficiency 01/17/2016   Pain in limb 01/17/2016   Iron deficiency anemia due to chronic blood loss 01/09/2016   History of  endometrial cancer 08/01/2015   Endometrial adenocarcinoma ([place]) 07/13/2015    Class: History of   Inguinal lymphadenopathy 07/13/2015   Long term current use of insulin ([place]) 12/12/2013   Microalbuminuria 12/12/2013   Diabetes ([place]) 07/28/2013   BP (high blood pressure) 07/28/2013   HLD (hyperlipidemia) 07/28/2013   Adiposity 07/28/2013   Obstructive apnea 07/28/2013   Arthritis, degenerative 07/28/2013   Apnea, sleep 07/28/2013   Thyroid nodule 07/28/2013    PCP: Ginette Pitman MD  REFERRING PROVIDER: Arvella Nigh PA  REFERRING DIAG: L shoulder pain   THERAPY DIAG:  Chronic left shoulder pain  Rationale for Evaluation and Treatment: Rehabilitation  ONSET DATE: unable to specify  SUBJECTIVE:  SUBJECTIVE STATEMENT: Pt reports 7/10 pain today. She reports doing her pulleys at home.   PERTINENT HISTORY: Pt returns to this clinic for evaluation of L shoulder pain. Ceased past PT for the shoulder after 4 visits d/t increased knee pain becoming more of an issue she preferred to be seen for. Her L shoulder pain has been the same since June 2023. Reports her pain is along the top anterior part of the shoulder. Reports current 6/10 shoulder pain; worst 8/10; best 5/10. Pain is increased by lifting, carrying, and overhead reach. Has some trouble pushing up from a chair and pushing her doors open. Pain is alleviated by rest. Patient is unemployed, enjoys playing games with her sister, reading/writing in her Bible, and going to church. Pt denies N/V, B&B changes, unexplained weight fluctuation, saddle paresthesia, fever, night sweats, or unrelenting night pain at this time.   PAIN:  Are you having pain? Yes: NPRS scale: 6/10 Pain location: L anterior/ superior shoulder Pain description: tight tension Aggravating factors:  lifting, carrying, and overhead reach. Has some trouble pushing up from a chair and pushing her doors open.  Relieving factors: relaxing  PRECAUTIONS: None  WEIGHT BEARING RESTRICTIONS: No  FALLS:  Has patient fallen in last 6 months? Yes. Number of falls 1  LIVING ENVIRONMENT: Lives with: lives with their family Lives in: House/apartment Stairs: No Has following equipment at home: Single point cane  OCCUPATION: unemployeed  PLOF: Independent with household mobility with device  PATIENT GOALS:Increase my shoulder motion and strength  OBJECTIVE:   DIAGNOSTIC FINDINGS:  XRAY  PATIENT SURVEYS:  FOTO with goal of  COGNITION: Overall cognitive status: Within functional limits for tasks assessed     SENSATION: WFL  POSTURE: Wide BOS, upper crossed  UPPER EXTREMITY ROM:   Active / PassiveROM Right eval Left eval  Shoulder flexion 138/143 126/130  Shoulder extension 44/not tested 41/not tested  Shoulder abduction 118/142 108/130  Shoulder adduction    Shoulder internal rotation L1/90 L1/82  Shoulder external rotation (apleys) C4/82 C2/80  Elbow flexion WNL WNL  Elbow extension WNL WNL  Wrist flexion    Wrist extension    Wrist ulnar deviation    Wrist radial deviation    Wrist pronation    Wrist supination    (Blank rows = not tested)  UPPER EXTREMITY MMT:  MMT Right eval Left eval  Shoulder flexion 5 4+  Shoulder extension 5 5  Shoulder abduction 5 4+  Shoulder adduction    Shoulder internal rotation 4+ 4-  Shoulder external rotation 4+ 4+  Middle trapezius 2- 2-  Lower trapezius 2- 2-  Elbow flexion 5 5  Elbow extension 5 5  Wrist flexion    Wrist extension    Wrist ulnar deviation    Wrist radial deviation    Wrist pronation    Wrist supination    Grip strength (lbs)    (Blank rows = not tested)  SHOULDER SPECIAL TESTS: Impingement tests: Neer impingement test: positive , Hawkins/Kennedy impingement test: positive , and Painful arc  test: positive  impingement tests positive bilat with more pain with testing on RUE SLAP lesions: Crank test: negative and Biceps load test: negative  Rotator cuff assessment: Drop arm test: positive , Empty can test: positive , Full can test: negative, External rotation lag sign: negative, Internal rotation lag sign: positive , Gerber lift off test: negative, and Infraspinatus test: positive    JOINT MOBILITY TESTING:  Guarding throughout  PALPATION:  Concordant pain sign to subacromial  space, supraspinatus and infraspinatus insertion    TODAY'S TREATMENT:                                                                                                                                          UBE 87mn fwd 271m bwd L2 for low level strengthening and mobility  TRX walkouts x12 Scaption RTB 2x 10 with consistent cuing for posture with good carry over Bilat ER RTB 2x 10 with cuing for posture and eccentric control with good carry over Y on wall 2x 10 with consistent cuing for technique with good carry over Overhead wt'd ball bounces on wall 2x 15 with cuing for technique with decent carry voer Seated thoracic ext over half foam with dowel AAROM bilat OHF x12 with cuing for reath control  PATIENT EDUCATION: Education details: Patient was educated on diagnosis, anatomy and pathology involved, prognosis, role of PT, and was given an HEP, demonstrating exercise with proper form following verbal and tactile cues, and was given a paper hand out to continue exercise at home. Pt was educated on and agreed to plan of care.  Person educated: Patient Education method: Explanation, Demonstration, and Verbal cues Education comprehension: verbalized understanding, returned demonstration, and verbal cues required  HOME EXERCISE PROGRAM: Access Code: [email or web address]- Seated Shoulder Flexion AAROM with Pulley Behind  - 1-3 x daily - 7 x weekly - 12-20 reps - Seated Shoulder Abduction AAROM with Pulley Behind   - 1-3 x daily - 7 x weekly - 12-20 reps - Seated Scapular Retraction  - 8 x daily - 7 x weekly - 12-20 reps  ASSESSMENT:  CLINICAL IMPRESSION: PT continued therapeutic exercise to improve resting posture, strengthen para scapular musculature, and increase mobility of left shoulder. Pt able to comply with all exercises prescribed during today's session with no increased pain. Pt requires resting break about every two exercises. Patient will continue to benefit from skilled physical therapy to improve left shoulder pain free ROM and improve posture to address impingement and to improve QOL.    OBJECTIVE IMPAIRMENTS: Abnormal gait, decreased activity tolerance, decreased endurance, decreased mobility, difficulty walking, decreased ROM, decreased strength, increased fascial restrictions, impaired perceived functional ability, increased muscle spasms, impaired flexibility, impaired sensation, impaired tone, impaired UE functional use, improper body mechanics, postural dysfunction, and pain.   ACTIVITY LIMITATIONS: carrying, lifting, sitting, standing, squatting, transfers, bed mobility, bathing, dressing, and reach over head  PARTICIPATION LIMITATIONS: cleaning, driving, shopping, community activity, and yard work  PERSONAL FACTORS: Age, Education, Fitness, Past/current experiences, Sex, Social background, Time since onset of injury/illness/exacerbation, Transportation, and 3+ comorbidities: BP, HLD, DM, obesity, chronic pain  are also affecting patient's functional outcome.   REHAB POTENTIAL: Good  CLINICAL DECISION MAKING: Evolving/moderate complexity  EVALUATION COMPLEXITY: Moderate   GOALS: Goals reviewed with patient? Yes  SHORT TERM GOALS: Target date: 02/10/2022  (Remove Blue Hyperlink)  Pt will be independent  with HEP in order to improve strength and balance in order to decrease fall risk and improve function at home and work.  Baseline: HEP given Goal status: INITIAL   LONG  TERM GOALS: Target date: 03/10/2022  (Remove Blue Hyperlink)  Pt will decrease worst pain as reported on NPRS by at least 3 points in order to demonstrate clinically significant reduction in pain.  Baseline: 8/10 Goal status: INITIAL  2.  Patient will increase FOTO score to 58 to demonstrate predicted increase in functional mobility to complete ADLs  Baseline: 50 Goal status: INITIAL  3.  Patient will demonstrate bilat overhead flex of 140d in order to complete overhead reaching ADLs Baseline: R/L AROM 138/126 Goal status: INITIAL  4.  Patient will be able to place 15# item on a shelf overhead to demonstrate ability to place groceries in cupboard/fridge Baseline: unable to lift any amount overhead Goal status: INITIAL  5.  Pt will be able to carry 10# in left hand for at least 140f in order to demonstrate carrying items into her home Baseline: unable to carry  Goal status: INITIAL   PLAN:  PT FREQUENCY: 1-2x/week  PT DURATION: 12 weeks  PLANNED INTERVENTIONS: Therapeutic exercises, Therapeutic activity, Neuromuscular re-education, Balance training, Gait training, Patient/Family education, Self Care, Joint mobilization, Joint manipulation, Stair training, Vestibular training, DME instructions, Dry Needling, Electrical stimulation, Spinal manipulation, Spinal mobilization, Cryotherapy, and Moist heat  PLAN FOR NEXT SESSION:L shoulder mobility, scapulohumeral rhythm  JMayo Ao SPT  CDurwin RegesDPT CDurwin Reges PT 01/13/2022, 3:44 PM

## 2022-01-16 ENCOUNTER — Encounter: Payer: Medicare Other | Admitting: Physical Therapy

## 2022-01-16 ENCOUNTER — Ambulatory Visit: Payer: Medicare Other | Admitting: Physical Therapy

## 2022-01-16 ENCOUNTER — Encounter: Payer: Self-pay | Admitting: Physical Therapy

## 2022-01-16 DIAGNOSIS — M25612 Stiffness of left shoulder, not elsewhere classified: Secondary | ICD-10-CM

## 2022-01-16 DIAGNOSIS — M25512 Pain in left shoulder: Secondary | ICD-10-CM | POA: Diagnosis not present

## 2022-01-16 DIAGNOSIS — G8929 Other chronic pain: Secondary | ICD-10-CM

## 2022-01-16 NOTE — Therapy (Signed)
OUTPATIENT PHYSICAL THERAPY SHOULDER EVALUATION   Patient Name: Christine Lawson MRN: [id number] DOB:21-Feb-1961, 61 y.o., female Today's Date: 01/17/2022   PT End of Session - 01/16/22 1333     Visit Number 4    Number of Visits 17    Date for PT Re-Evaluation 12/13/21    Authorization Type [employer]    Authorization - Visit Number 4    Authorization - Number of Visits 10    Progress Note Due on Visit 10    PT Start Time 1344    PT Stop Time 1423    PT Time Calculation (min) 39 min    Activity Tolerance Patient tolerated treatment well;No increased pain    Behavior During Therapy WFL for tasks assessed/performed                 Past Medical History:  Diagnosis Date   Diabetes mellitus without complication ([place])    Endometrial adenocarcinoma ([place])    GERD (gastroesophageal reflux disease)    Hematochezia    Hyperlipidemia    Hypertension    IDA (iron deficiency anemia)    Inguinal lymphadenopathy 07/13/2015   Irritable bowel syndrome    Obesity    Osteoarthritis    Sleep apnea    Past Surgical History:  Procedure Laterality Date   ABDOMINAL HYSTERECTOMY  feb 2013   CARPAL TUNNEL RELEASE Left 03/20/2021   Procedure: CARPAL TUNNEL RELEASE ENDOSCOPIC;  Surgeon: Corky Mull, MD;  Location: [facility] ORS;  Service: Orthopedics;  Laterality: Left;   COLONOSCOPY WITH PROPOFOL N/A 08/02/2015   Procedure: COLONOSCOPY WITH PROPOFOL;  Surgeon: Manya Silvas, MD;  Location: [facility] ENDOSCOPY;  Service: Endoscopy;  Laterality: N/A;   ESOPHAGOGASTRODUODENOSCOPY (EGD) WITH PROPOFOL N/A 08/02/2015   Procedure: ESOPHAGOGASTRODUODENOSCOPY (EGD) WITH PROPOFOL;  Surgeon: Manya Silvas, MD;  Location: [facility] ENDOSCOPY;  Service: Endoscopy;  Laterality: N/A;   Patient Active Problem List   Diagnosis Date Noted   Lymphedema 01/17/2016   Venous (peripheral) insufficiency 01/17/2016   Pain in limb 01/17/2016   Iron deficiency anemia due to chronic blood loss 01/09/2016   History of  endometrial cancer 08/01/2015   Endometrial adenocarcinoma ([place]) 07/13/2015    Class: History of   Inguinal lymphadenopathy 07/13/2015   Long term current use of insulin ([place]) 12/12/2013   Microalbuminuria 12/12/2013   Diabetes ([place]) 07/28/2013   BP (high blood pressure) 07/28/2013   HLD (hyperlipidemia) 07/28/2013   Adiposity 07/28/2013   Obstructive apnea 07/28/2013   Arthritis, degenerative 07/28/2013   Apnea, sleep 07/28/2013   Thyroid nodule 07/28/2013    PCP: Ginette Pitman MD  REFERRING PROVIDER: Arvella Nigh PA  REFERRING DIAG: L shoulder pain   THERAPY DIAG:  Chronic left shoulder pain  Stiffness of left shoulder, not elsewhere classified  Rationale for Evaluation and Treatment: Rehabilitation  ONSET DATE: unable to specify  SUBJECTIVE:  SUBJECTIVE STATEMENT: Pt reports 6-7/10 pain today. She reports compliance with HEP.   PERTINENT HISTORY: Pt returns to this clinic for evaluation of L shoulder pain. Ceased past PT for the shoulder after 4 visits d/t increased knee pain becoming more of an issue she preferred to be seen for. Her L shoulder pain has been the same since June 2023. Reports her pain is along the top anterior part of the shoulder. Reports current 6/10 shoulder pain; worst 8/10; best 5/10. Pain is increased by lifting, carrying, and overhead reach. Has some trouble pushing up from a chair and pushing her doors open. Pain is alleviated by rest. Patient is unemployed, enjoys playing games with her sister, reading/writing in her Bible, and going to church. Pt denies N/V, B&B changes, unexplained weight fluctuation, saddle paresthesia, fever, night sweats, or unrelenting night pain at this time.   PAIN:  Are you having pain? Yes: NPRS scale: 6/10 Pain location: L anterior/ superior shoulder Pain  description: tight tension Aggravating factors: lifting, carrying, and overhead reach. Has some trouble pushing up from a chair and pushing her doors open.  Relieving factors: relaxing  PRECAUTIONS: None  WEIGHT BEARING RESTRICTIONS: No  FALLS:  Has patient fallen in last 6 months? Yes. Number of falls 1  LIVING ENVIRONMENT: Lives with: lives with their family Lives in: House/apartment Stairs: No Has following equipment at home: Single point cane  OCCUPATION: unemployeed  PLOF: Independent with household mobility with device  PATIENT GOALS:Increase my shoulder motion and strength  OBJECTIVE:   DIAGNOSTIC FINDINGS:  XRAY  PATIENT SURVEYS:  FOTO with goal of  COGNITION: Overall cognitive status: Within functional limits for tasks assessed     SENSATION: WFL  POSTURE: Wide BOS, upper crossed  UPPER EXTREMITY ROM:   Active / PassiveROM Right eval Left eval  Shoulder flexion 138/143 126/130  Shoulder extension 44/not tested 41/not tested  Shoulder abduction 118/142 108/130  Shoulder adduction    Shoulder internal rotation L1/90 L1/82  Shoulder external rotation (apleys) C4/82 C2/80  Elbow flexion WNL WNL  Elbow extension WNL WNL  Wrist flexion    Wrist extension    Wrist ulnar deviation    Wrist radial deviation    Wrist pronation    Wrist supination    (Blank rows = not tested)  UPPER EXTREMITY MMT:  MMT Right eval Left eval  Shoulder flexion 5 4+  Shoulder extension 5 5  Shoulder abduction 5 4+  Shoulder adduction    Shoulder internal rotation 4+ 4-  Shoulder external rotation 4+ 4+  Middle trapezius 2- 2-  Lower trapezius 2- 2-  Elbow flexion 5 5  Elbow extension 5 5  Wrist flexion    Wrist extension    Wrist ulnar deviation    Wrist radial deviation    Wrist pronation    Wrist supination    Grip strength (lbs)    (Blank rows = not tested)  SHOULDER SPECIAL TESTS: Impingement tests: Neer impingement test: positive , Hawkins/Kennedy  impingement test: positive , and Painful arc test: positive  impingement tests positive bilat with more pain with testing on RUE SLAP lesions: Crank test: negative and Biceps load test: negative  Rotator cuff assessment: Drop arm test: positive , Empty can test: positive , Full can test: negative, External rotation lag sign: negative, Internal rotation lag sign: positive , Gerber lift off test: negative, and Infraspinatus test: positive    JOINT MOBILITY TESTING:  Guarding throughout  PALPATION:  Concordant pain sign to subacromial space, supraspinatus  and infraspinatus insertion    TODAY'S TREATMENT:                                                                                                                                          UBE 79mn fwd 245m bwd L2 for low level strengthening and mobility  TRX walkouts 1 set of 6 PVC pole shoulder flexion walk ups 1 set of 6 reps 3 second holds at the top Wall pushups 2 sets of 10, pt cued for proper distance of feet from wall Theraball roll up the wall with alternating UE liftoff 2 sets of 5 reps, pt cued for proper foot placement and keeping upright posture Scaption RTB 2x 10 with consistent cuing for posture with good carry over RTB diagonals and horizontal 2 sets of 5 cycles Bilat ER RTB 3x 10 with cuing for posture and eccentric control with good carry over  PATIENT EDUCATION: Education details: Patient was educated on diagnosis, anatomy and pathology involved, prognosis, role of PT, and was given an HEP, demonstrating exercise with proper form following verbal and tactile cues, and was given a paper hand out to continue exercise at home. Pt was educated on and agreed to plan of care.  Person educated: Patient Education method: Explanation, Demonstration, and Verbal cues Education comprehension: verbalized understanding, returned demonstration, and verbal cues required  HOME EXERCISE PROGRAM: Access Code: [email or web address]  - Seated Shoulder  Flexion AAROM with Pulley Behind  - 1-3 x daily - 7 x weekly - 12-20 reps - Seated Shoulder Abduction AAROM with Pulley Behind  - 1-3 x daily - 7 x weekly - 12-20 reps - Seated Scapular Retraction  - 8 x daily - 7 x weekly - 12-20 reps  ASSESSMENT:  CLINICAL IMPRESSION:  PT continued and progressed therapeutic exercise to improve strength and mobility of L shoulder. Pt was able to complete exercises with no increase in L shoulder pain at the end of session. Pt requires seated rest break between after every two exercises and a standing break between sets. Pt demonstrates L shoulder is still limited in ROM and strength compared to the R shoulder. Patient will continue to benefit from skilled physical therapy to improve left scapulothoracic and scapulohumeral rhythm for increased independence with basic ADL.   OBJECTIVE IMPAIRMENTS: Abnormal gait, decreased activity tolerance, decreased endurance, decreased mobility, difficulty walking, decreased ROM, decreased strength, increased fascial restrictions, impaired perceived functional ability, increased muscle spasms, impaired flexibility, impaired sensation, impaired tone, impaired UE functional use, improper body mechanics, postural dysfunction, and pain.   ACTIVITY LIMITATIONS: carrying, lifting, sitting, standing, squatting, transfers, bed mobility, bathing, dressing, and reach over head  PARTICIPATION LIMITATIONS: cleaning, driving, shopping, community activity, and yard work  PERSONAL FACTORS: Age, Education, Fitness, Past/current experiences, Sex, Social background, Time since onset of injury/illness/exacerbation, Transportation, and 3+ comorbidities: BP, HLD, DM, obesity, chronic pain  are also affecting patient's functional outcome.  REHAB POTENTIAL: Good  CLINICAL DECISION MAKING: Evolving/moderate complexity  EVALUATION COMPLEXITY: Moderate   GOALS: Goals reviewed with patient? Yes  SHORT TERM GOALS: Target date: 02/14/2022   (Remove Blue Hyperlink)  Pt will be independent with HEP in order to improve strength and balance in order to decrease fall risk and improve function at home and work.  Baseline: HEP given Goal status: INITIAL   LONG TERM GOALS: Target date: 03/14/2022  (Remove Blue Hyperlink)  Pt will decrease worst pain as reported on NPRS by at least 3 points in order to demonstrate clinically significant reduction in pain.  Baseline: 8/10 Goal status: INITIAL  2.  Patient will increase FOTO score to 58 to demonstrate predicted increase in functional mobility to complete ADLs  Baseline: 50 Goal status: INITIAL  3.  Patient will demonstrate bilat overhead flex of 140d in order to complete overhead reaching ADLs Baseline: R/L AROM 138/126 Goal status: INITIAL  4.  Patient will be able to place 15# item on a shelf overhead to demonstrate ability to place groceries in cupboard/fridge Baseline: unable to lift any amount overhead Goal status: INITIAL  5.  Pt will be able to carry 10# in left hand for at least 157f in order to demonstrate carrying items into her home Baseline: unable to carry  Goal status: INITIAL   PLAN:  PT FREQUENCY: 1-2x/week  PT DURATION: 12 weeks  PLANNED INTERVENTIONS: Therapeutic exercises, Therapeutic activity, Neuromuscular re-education, Balance training, Gait training, Patient/Family education, Self Care, Joint mobilization, Joint manipulation, Stair training, Vestibular training, DME instructions, Dry Needling, Electrical stimulation, Spinal manipulation, Spinal mobilization, Cryotherapy, and Moist heat  PLAN FOR NEXT SESSION:L shoulder mobility, scapulohumeral rhythm  JMayo Ao SPT  CDurwin RegesDPT CDurwin Reges PT 01/17/2022, 9:15 AM

## 2022-01-20 ENCOUNTER — Encounter: Payer: Medicare Other | Admitting: Physical Therapy

## 2022-01-27 ENCOUNTER — Encounter: Payer: Medicare Other | Admitting: Physical Therapy

## 2022-01-29 ENCOUNTER — Encounter: Payer: Medicare Other | Admitting: Physical Therapy

## 2022-01-29 ENCOUNTER — Ambulatory Visit: Payer: Medicare Other | Admitting: Physical Therapy

## 2022-01-29 DIAGNOSIS — M25612 Stiffness of left shoulder, not elsewhere classified: Secondary | ICD-10-CM

## 2022-01-29 DIAGNOSIS — G8929 Other chronic pain: Secondary | ICD-10-CM

## 2022-01-29 DIAGNOSIS — M25512 Pain in left shoulder: Secondary | ICD-10-CM | POA: Diagnosis not present

## 2022-01-29 NOTE — Therapy (Unsigned)
OUTPATIENT PHYSICAL THERAPY SHOULDER EVALUATION   Patient Name: Christine Lawson MRN: [id number] DOB:[birth year], 61 y.o., female Today's Date: 01/30/2022   PT End of Session - 01/29/22 1338     Visit Number 5    Number of Visits 17    Date for PT Re-Evaluation 03/02/22    Authorization - Visit Number 5    Authorization - Number of Visits 10    Progress Note Due on Visit 10    PT Start Time 0160    PT Stop Time 1425    PT Time Calculation (min) 40 min    Activity Tolerance Patient tolerated treatment well;No increased pain    Behavior During Therapy WFL for tasks assessed/performed                  Past Medical History:  Diagnosis Date   Diabetes mellitus without complication ([place])    Endometrial adenocarcinoma ([place])    GERD (gastroesophageal reflux disease)    Hematochezia    Hyperlipidemia    Hypertension    IDA (iron deficiency anemia)    Inguinal lymphadenopathy 07/13/2015   Irritable bowel syndrome    Obesity    Osteoarthritis    Sleep apnea    Past Surgical History:  Procedure Laterality Date   ABDOMINAL HYSTERECTOMY  feb 2013   CARPAL TUNNEL RELEASE Left 03/20/2021   Procedure: CARPAL TUNNEL RELEASE ENDOSCOPIC;  Surgeon: Corky Mull, MD;  Location: [facility] ORS;  Service: Orthopedics;  Laterality: Left;   COLONOSCOPY WITH PROPOFOL N/A 08/02/2015   Procedure: COLONOSCOPY WITH PROPOFOL;  Surgeon: Manya Silvas, MD;  Location: [facility] ENDOSCOPY;  Service: Endoscopy;  Laterality: N/A;   ESOPHAGOGASTRODUODENOSCOPY (EGD) WITH PROPOFOL N/A 08/02/2015   Procedure: ESOPHAGOGASTRODUODENOSCOPY (EGD) WITH PROPOFOL;  Surgeon: Manya Silvas, MD;  Location: [facility] ENDOSCOPY;  Service: Endoscopy;  Laterality: N/A;   Patient Active Problem List   Diagnosis Date Noted   Lymphedema 01/17/2016   Venous (peripheral) insufficiency 01/17/2016   Pain in limb 01/17/2016   Iron deficiency anemia due to chronic blood loss 01/09/2016   History of endometrial cancer 08/01/2015    Endometrial adenocarcinoma ([place]) 07/13/2015    Class: History of   Inguinal lymphadenopathy 07/13/2015   Long term current use of insulin ([place]) 12/12/2013   Microalbuminuria 12/12/2013   Diabetes ([place]) 07/28/2013   BP (high blood pressure) 07/28/2013   HLD (hyperlipidemia) 07/28/2013   Adiposity 07/28/2013   Obstructive apnea 07/28/2013   Arthritis, degenerative 07/28/2013   Apnea, sleep 07/28/2013   Thyroid nodule 07/28/2013    PCP: Ginette Pitman MD  REFERRING PROVIDER: Arvella Nigh PA  REFERRING DIAG: L shoulder pain   THERAPY DIAG:  No diagnosis found.  Rationale for Evaluation and Treatment: Rehabilitation  ONSET DATE: unable to specify  SUBJECTIVE:  SUBJECTIVE STATEMENT: Pt reports 6-7/10 pain today. She reports compliance with HEP.   PERTINENT HISTORY: Pt returns to this clinic for evaluation of L shoulder pain. Ceased past PT for the shoulder after 4 visits d/t increased knee pain becoming more of an issue she preferred to be seen for. Her L shoulder pain has been the same since June 2023. Reports her pain is along the top anterior part of the shoulder. Reports current 6/10 shoulder pain; worst 8/10; best 5/10. Pain is increased by lifting, carrying, and overhead reach. Has some trouble pushing up from a chair and pushing her doors open. Pain is alleviated by rest. Patient is unemployed, enjoys playing games with her sister, reading/writing in her Bible, and going to church. Pt denies N/V, B&B changes, unexplained weight fluctuation, saddle paresthesia, fever, night sweats, or unrelenting night pain at this time.   PAIN:  Are you having pain? Yes: NPRS scale: 6/10 Pain location: L anterior/ superior shoulder Pain description: tight tension Aggravating factors: lifting, carrying, and overhead reach. Has  some trouble pushing up from a chair and pushing her doors open.  Relieving factors: relaxing  PRECAUTIONS: None  WEIGHT BEARING RESTRICTIONS: No  FALLS:  Has patient fallen in last 6 months? Yes. Number of falls 1  LIVING ENVIRONMENT: Lives with: lives with their family Lives in: House/apartment Stairs: No Has following equipment at home: Single point cane  OCCUPATION: unemployeed  PLOF: Independent with household mobility with device  PATIENT GOALS:Increase my shoulder motion and strength  OBJECTIVE:   DIAGNOSTIC FINDINGS:  XRAY  PATIENT SURVEYS:  FOTO with goal of  COGNITION: Overall cognitive status: Within functional limits for tasks assessed     SENSATION: WFL  POSTURE: Wide BOS, upper crossed  UPPER EXTREMITY ROM:   Active / PassiveROM Right eval Left eval  Shoulder flexion 138/143 126/130  Shoulder extension 44/not tested 41/not tested  Shoulder abduction 118/142 108/130  Shoulder adduction    Shoulder internal rotation L1/90 L1/82  Shoulder external rotation (apleys) C4/82 C2/80  Elbow flexion WNL WNL  Elbow extension WNL WNL  Wrist flexion    Wrist extension    Wrist ulnar deviation    Wrist radial deviation    Wrist pronation    Wrist supination    (Blank rows = not tested)  UPPER EXTREMITY MMT:  MMT Right eval Left eval  Shoulder flexion 5 4+  Shoulder extension 5 5  Shoulder abduction 5 4+  Shoulder adduction    Shoulder internal rotation 4+ 4-  Shoulder external rotation 4+ 4+  Middle trapezius 2- 2-  Lower trapezius 2- 2-  Elbow flexion 5 5  Elbow extension 5 5  Wrist flexion    Wrist extension    Wrist ulnar deviation    Wrist radial deviation    Wrist pronation    Wrist supination    Grip strength (lbs)    (Blank rows = not tested)  SHOULDER SPECIAL TESTS: Impingement tests: Neer impingement test: positive , Hawkins/Kennedy impingement test: positive , and Painful arc test: positive  impingement tests positive  bilat with more pain with testing on RUE SLAP lesions: Crank test: negative and Biceps load test: negative  Rotator cuff assessment: Drop arm test: positive , Empty can test: positive , Full can test: negative, External rotation lag sign: negative, Internal rotation lag sign: positive , Gerber lift off test: negative, and Infraspinatus test: positive    JOINT MOBILITY TESTING:  Guarding throughout  PALPATION:  Concordant pain sign to subacromial space, supraspinatus  and infraspinatus insertion    TODAY'S TREATMENT:                                                                                                                                          UBE 1mn fwd 281m bwd L2 for low level strengthening and mobility  TRX walkouts x15 Alt scap taps wall plank RTB 2x 12 with sticky note on wall for VC of target with good carry over Seated overhead press with cane to occipute 2x12 with consistent cuing for posture "sit up tall, keep head back" Scaption 2# DB bilat 2x 10 with back against wall for TC of posture with good carry over Bilat ER RTB 3x 10 with cuing for posture and eccentric control with good carry over  PATIENT EDUCATION: Education details: Patient was educated on diagnosis, anatomy and pathology involved, prognosis, role of PT, and was given an HEP, demonstrating exercise with proper form following verbal and tactile cues, and was given a paper hand out to continue exercise at home. Pt was educated on and agreed to plan of care.  Person educated: Patient Education method: Explanation, Demonstration, and Verbal cues Education comprehension: verbalized understanding, returned demonstration, and verbal cues required  HOME EXERCISE PROGRAM: Access Code: [email or web address]  - Seated Shoulder Flexion AAROM with Pulley Behind  - 1-3 x daily - 7 x weekly - 12-20 reps - Seated Shoulder Abduction AAROM with Pulley Behind  - 1-3 x daily - 7 x weekly - 12-20 reps - Seated Scapular Retraction  - 8  x daily - 7 x weekly - 12-20 reps  ASSESSMENT:  CLINICAL IMPRESSION:  PT continued and progressed therapeutic exercise to improve strength and mobility of L shoulder. Pt was able to complete exercises with no increase in L shoulder pain at the end of session. Pt is able to comply with cuing for proper technique of therex. Overhead mobility limited due to decreased thoracic mobility/postural abnormality. Patient with good effort throughout session. PT will continue progression as able.    OBJECTIVE IMPAIRMENTS: Abnormal gait, decreased activity tolerance, decreased endurance, decreased mobility, difficulty walking, decreased ROM, decreased strength, increased fascial restrictions, impaired perceived functional ability, increased muscle spasms, impaired flexibility, impaired sensation, impaired tone, impaired UE functional use, improper body mechanics, postural dysfunction, and pain.   ACTIVITY LIMITATIONS: carrying, lifting, sitting, standing, squatting, transfers, bed mobility, bathing, dressing, and reach over head  PARTICIPATION LIMITATIONS: cleaning, driving, shopping, community activity, and yard work  PERSONAL FACTORS: Age, Education, Fitness, Past/current experiences, Sex, Social background, Time since onset of injury/illness/exacerbation, Transportation, and 3+ comorbidities: BP, HLD, DM, obesity, chronic pain  are also affecting patient's functional outcome.   REHAB POTENTIAL: Good  CLINICAL DECISION MAKING: Evolving/moderate complexity  EVALUATION COMPLEXITY: Moderate   GOALS: Goals reviewed with patient? Yes  SHORT TERM GOALS: Target date: 02/27/2022  (Remove Blue Hyperlink)  Pt will be independent with HEP in order to  improve strength and balance in order to decrease fall risk and improve function at home and work.  Baseline: HEP given Goal status: INITIAL   LONG TERM GOALS: Target date: 03/27/2022  (Remove Blue Hyperlink)  Pt will decrease worst pain as reported on  NPRS by at least 3 points in order to demonstrate clinically significant reduction in pain.  Baseline: 8/10 Goal status: INITIAL  2.  Patient will increase FOTO score to 58 to demonstrate predicted increase in functional mobility to complete ADLs  Baseline: 50 Goal status: INITIAL  3.  Patient will demonstrate bilat overhead flex of 140d in order to complete overhead reaching ADLs Baseline: R/L AROM 138/126 Goal status: INITIAL  4.  Patient will be able to place 15# item on a shelf overhead to demonstrate ability to place groceries in cupboard/fridge Baseline: unable to lift any amount overhead Goal status: INITIAL  5.  Pt will be able to carry 10# in left hand for at least 127f in order to demonstrate carrying items into her home Baseline: unable to carry  Goal status: INITIAL   PLAN:  PT FREQUENCY: 1-2x/week  PT DURATION: 12 weeks  PLANNED INTERVENTIONS: Therapeutic exercises, Therapeutic activity, Neuromuscular re-education, Balance training, Gait training, Patient/Family education, Self Care, Joint mobilization, Joint manipulation, Stair training, Vestibular training, DME instructions, Dry Needling, Electrical stimulation, Spinal manipulation, Spinal mobilization, Cryotherapy, and Moist heat  PLAN FOR NEXT SESSION:L shoulder mobility, scapulohumeral rhythm  JMayo Ao SPT  CDurwin RegesDPT CDurwin Reges PT 01/30/2022, 12:41 PM

## 2022-02-03 ENCOUNTER — Inpatient Hospital Stay (HOSPITAL_BASED_OUTPATIENT_CLINIC_OR_DEPARTMENT_OTHER): Payer: Medicare Other | Admitting: Internal Medicine

## 2022-02-03 ENCOUNTER — Inpatient Hospital Stay: Payer: Medicare Other

## 2022-02-03 ENCOUNTER — Inpatient Hospital Stay: Payer: Medicare Other | Attending: Internal Medicine

## 2022-02-03 ENCOUNTER — Encounter: Payer: Self-pay | Admitting: Internal Medicine

## 2022-02-03 VITALS — BP 180/74 | HR 83 | Temp 97.2°F | Resp 18 | Wt 278.0 lb

## 2022-02-03 DIAGNOSIS — Z79899 Other long term (current) drug therapy: Secondary | ICD-10-CM | POA: Insufficient documentation

## 2022-02-03 DIAGNOSIS — Z91041 Radiographic dye allergy status: Secondary | ICD-10-CM | POA: Diagnosis not present

## 2022-02-03 DIAGNOSIS — E119 Type 2 diabetes mellitus without complications: Secondary | ICD-10-CM | POA: Diagnosis not present

## 2022-02-03 DIAGNOSIS — Z833 Family history of diabetes mellitus: Secondary | ICD-10-CM | POA: Insufficient documentation

## 2022-02-03 DIAGNOSIS — C541 Malignant neoplasm of endometrium: Secondary | ICD-10-CM | POA: Diagnosis not present

## 2022-02-03 DIAGNOSIS — D509 Iron deficiency anemia, unspecified: Secondary | ICD-10-CM | POA: Diagnosis not present

## 2022-02-03 DIAGNOSIS — K648 Other hemorrhoids: Secondary | ICD-10-CM | POA: Diagnosis not present

## 2022-02-03 DIAGNOSIS — D500 Iron deficiency anemia secondary to blood loss (chronic): Secondary | ICD-10-CM

## 2022-02-03 DIAGNOSIS — Z6841 Body Mass Index (BMI) 40.0 and over, adult: Secondary | ICD-10-CM | POA: Diagnosis not present

## 2022-02-03 DIAGNOSIS — Z882 Allergy status to sulfonamides status: Secondary | ICD-10-CM | POA: Insufficient documentation

## 2022-02-03 DIAGNOSIS — D75839 Thrombocytosis, unspecified: Secondary | ICD-10-CM | POA: Diagnosis not present

## 2022-02-03 LAB — IRON AND TIBC
Iron: 62 ug/dL (ref 28–170)
Saturation Ratios: 17 % (ref 10.4–31.8)
TIBC: 368 ug/dL (ref 250–450)
UIBC: 306 ug/dL

## 2022-02-03 LAB — CBC WITH DIFFERENTIAL/PLATELET
Abs Immature Granulocytes: 0.09 10*3/uL — ABNORMAL HIGH (ref 0.00–0.07)
Basophils Absolute: 0.1 10*3/uL (ref 0.0–0.1)
Basophils Relative: 1 %
Eosinophils Absolute: 0.3 10*3/uL (ref 0.0–0.5)
Eosinophils Relative: 3 %
HCT: 37.5 % (ref 36.0–46.0)
Hemoglobin: 11.5 g/dL — ABNORMAL LOW (ref 12.0–15.0)
Immature Granulocytes: 1 %
Lymphocytes Relative: 20 %
Lymphs Abs: 2 10*3/uL (ref 0.7–4.0)
MCH: 25.1 pg — ABNORMAL LOW (ref 26.0–34.0)
MCHC: 30.7 g/dL (ref 30.0–36.0)
MCV: 81.7 fL (ref 80.0–100.0)
Monocytes Absolute: 0.5 10*3/uL (ref 0.1–1.0)
Monocytes Relative: 5 %
Neutro Abs: 7.2 10*3/uL (ref 1.7–7.7)
Neutrophils Relative %: 70 %
Platelets: 428 10*3/uL — ABNORMAL HIGH (ref 150–400)
RBC: 4.59 MIL/uL (ref 3.87–5.11)
RDW: 15.4 % (ref 11.5–15.5)
WBC: 10.1 10*3/uL (ref 4.0–10.5)
nRBC: 0 % (ref 0.0–0.2)

## 2022-02-03 LAB — BASIC METABOLIC PANEL
Anion gap: 11 (ref 5–15)
BUN: 14 mg/dL (ref 6–20)
CO2: 27 mmol/L (ref 22–32)
Calcium: 8.8 mg/dL — ABNORMAL LOW (ref 8.9–10.3)
Chloride: 100 mmol/L (ref 98–111)
Creatinine, Ser: 0.8 mg/dL (ref 0.44–1.00)
GFR, Estimated: >60 mL/min (ref >60)
Glucose, Bld: 253 mg/dL — ABNORMAL HIGH (ref 70–99)
Potassium: 3.9 mmol/L (ref 3.5–5.1)
Sodium: 138 mmol/L (ref 135–145)

## 2022-02-03 LAB — FERRITIN: Ferritin: 158 ng/mL (ref 11–307)

## 2022-02-03 MED FILL — Ferumoxytol Inj 510 MG/17ML (30 MG/ML) (Elemental Fe): INTRAVENOUS | Qty: 17 | Status: AC

## 2022-02-03 NOTE — Progress Notes (Signed)
Patient denies new problems/concerns today.   °

## 2022-02-03 NOTE — Progress Notes (Signed)
[place] OFFICE PROGRESS NOTE  Patient Care Team: Tracie Harrier, MD as PCP - General (Internal Medicine) Clent Jacks, RN as Registered Nurse Cammie Sickle, MD as Consulting Physician (Oncology)   Cancer Staging  No matching staging information was found for the patient.   Oncology History  Endometrial adenocarcinoma ([place])  07/13/2015 Initial Diagnosis   Endometrial adenocarcinoma ([place])    Anemia of iron deficiency unresponsive to oral iron therapy - got IV Venofer 1 g in Feb 2015, then on oral Ferrous sulfate 1 tablet daily. (labs on 03/28/13 had showed hemoglobin 10.0, MCV 71.3, WBC 10,800, ANC 7990, ALC 1640, platelet count 564, ferritin 27, serum iron 33, iron saturation low at 7%, TIBC 452. In June 2014 - hemoglobin 9.8, WBC 10,700, platelets 551, LFT unremarkable except alkaline phos of 120, creatinine 0.6). Stool Hemoccult was negative x2 in November 2014.  Colonoscopy July 2013 had shown internal hemorrhoids.  INTERVAL HISTORY: Accompanied by twin sister.  Ambulating with a cane.  Morbidly obese.  Christine Lawson 61 y.o.  female pleasant patient above history of iron deficiency anemia; also history of early stage endometrial cancer is here for follow-up.  Patient denies any nausea vomiting abdominal pain.  Denies any fatigue.  Patient denies any blood in stools or black-colored stools.  Patient is on iron pills once a day.  Review of Systems  Constitutional:  Negative for chills, diaphoresis, fever, malaise/fatigue and weight loss.  HENT:  Negative for nosebleeds and sore throat.   Eyes:  Negative for double vision.  Respiratory:  Negative for cough, hemoptysis, sputum production and wheezing.   Cardiovascular:  Negative for chest pain, palpitations, orthopnea and leg swelling.  Gastrointestinal:  Negative for abdominal pain, blood in stool, constipation, diarrhea, heartburn, melena, nausea and vomiting.  Genitourinary:  Negative for  dysuria, frequency and urgency.  Musculoskeletal:  Negative for back pain and joint pain.  Skin: Negative.  Negative for itching and rash.  Neurological:  Negative for dizziness, tingling, focal weakness, weakness and headaches.  Endo/Heme/Allergies:  Does not bruise/bleed easily.  Psychiatric/Behavioral:  Negative for depression. The patient is not nervous/anxious and does not have insomnia.       PAST MEDICAL HISTORY :  Past Medical History:  Diagnosis Date   Diabetes mellitus without complication ([place])    Endometrial adenocarcinoma ([place])    GERD (gastroesophageal reflux disease)    Hematochezia    Hyperlipidemia    Hypertension    IDA (iron deficiency anemia)    Inguinal lymphadenopathy 07/13/2015   Irritable bowel syndrome    Obesity    Osteoarthritis    Sleep apnea     PAST SURGICAL HISTORY :   Past Surgical History:  Procedure Laterality Date   ABDOMINAL HYSTERECTOMY  feb 2013   CARPAL TUNNEL RELEASE Left 03/20/2021   Procedure: CARPAL TUNNEL RELEASE ENDOSCOPIC;  Surgeon: Corky Mull, MD;  Location: [facility] ORS;  Service: Orthopedics;  Laterality: Left;   COLONOSCOPY WITH PROPOFOL N/A 08/02/2015   Procedure: COLONOSCOPY WITH PROPOFOL;  Surgeon: Manya Silvas, MD;  Location: [facility] ENDOSCOPY;  Service: Endoscopy;  Laterality: N/A;   ESOPHAGOGASTRODUODENOSCOPY (EGD) WITH PROPOFOL N/A 08/02/2015   Procedure: ESOPHAGOGASTRODUODENOSCOPY (EGD) WITH PROPOFOL;  Surgeon: Manya Silvas, MD;  Location: [facility] ENDOSCOPY;  Service: Endoscopy;  Laterality: N/A;    FAMILY HISTORY :   Family History  Problem Relation Age of Onset   Diabetes Mother    Diabetes Father    Breast cancer Neg Hx  SOCIAL HISTORY:   Social History   Tobacco Use   Smoking status: Never   Smokeless tobacco: Never  Substance Use Topics   Alcohol use: No   Drug use: No    ALLERGIES:  is allergic to solifenacin, contrast media [iodinated contrast media], dicyclomine, isovue [iopamidol], and sulfa  antibiotics.  MEDICATIONS:  Current Outpatient Medications  Medication Sig Dispense Refill   amLODipine (NORVASC) 10 MG tablet Take 10 mg by mouth daily.     ascorbic acid (VITAMIN C) 500 MG tablet Take 500 mg by mouth daily.     aspirin 81 MG EC tablet Take 81 mg by mouth daily. Swallow whole.     budesonide-formoterol (SYMBICORT) 80-4.5 MCG/ACT inhaler Inhale 2 puffs into the lungs 2 (two) times daily as needed (shortness of breath).     cholecalciferol (VITAMIN D) 1000 UNITS tablet Take 1,000 Units by mouth daily.     diclofenac sodium (VOLTAREN) 1 % GEL Apply 1 application topically 2 (two) times daily as needed for pain.     enalapril (VASOTEC) 5 MG tablet Take 5 mg by mouth daily.     ferrous sulfate 325 (65 FE) MG tablet Take 325 mg by mouth 2 (two) times daily with a meal.      HUMALOG KWIKPEN 100 UNIT/ML KwikPen Inject 26-60 Units into the skin 3 (three) times daily. Inject 26 units into the skin with breakfast and lunch and inject 60 units with supper     insulin glargine, 2 Unit Dial, (TOUJEO MAX SOLOSTAR) 300 UNIT/ML Solostar Pen Inject 120-140 Units into the skin daily.     montelukast (SINGULAIR) 10 MG tablet Take 10 mg by mouth daily.     OZEMPIC, 0.25 OR 0.5 MG/DOSE, 2 MG/3ML SOPN SMARTSIG:1 SUB-Q Once a Week     pantoprazole (PROTONIX) 40 MG tablet Take 40 mg by mouth daily.     Peppermint Oil (IBGARD PO) Take 2 tablets by mouth daily.     rosuvastatin (CRESTOR) 5 MG tablet Take 5 mg by mouth daily.     vitamin B-12 (CYANOCOBALAMIN) 500 MCG tablet Take 500 mcg by mouth daily.     albuterol (PROVENTIL HFA;VENTOLIN HFA) 108 (90 Base) MCG/ACT inhaler Inhale 1 puff into the lungs every 6 (six) hours as needed for wheezing or shortness of breath. (Patient not taking: Reported on 07/22/2021)     HYDROcodone-acetaminophen (NORCO) 5-325 MG tablet Take 1 tablet by mouth every 6 (six) hours as needed for moderate pain. MAXIMUM TOTAL ACETAMINOPHEN DOSE IS 4000 MG PER DAY (Patient not  taking: Reported on 07/22/2021) 20 tablet 0   No current facility-administered medications for this visit.    PHYSICAL EXAMINATION: ECOG PERFORMANCE STATUS: 0 - Asymptomatic  BP (!) 180/74 (BP Location: Right Arm, Patient Position: Sitting)   Pulse 83   Temp (!) 97.2 F (36.2 C) (Tympanic)   Resp 18   Wt 278 lb (126.1 kg)   BMI 49.25 kg/m   Filed Weights   02/03/22 1300  Weight: 278 lb (126.1 kg)    Physical Exam Constitutional:      Comments: Alone.  Walking with a cane.  Obese.  HENT:     Head: Normocephalic and atraumatic.     Mouth/Throat:     Pharynx: No oropharyngeal exudate.  Eyes:     Pupils: Pupils are equal, round, and reactive to light.  Cardiovascular:     Rate and Rhythm: Normal rate and regular rhythm.  Pulmonary:     Effort: No respiratory distress.  Breath sounds: No wheezing.  Abdominal:     General: Bowel sounds are normal. There is no distension.     Palpations: Abdomen is soft. There is no mass.     Tenderness: There is no abdominal tenderness. There is no guarding or rebound.  Musculoskeletal:        General: No tenderness. Normal range of motion.     Cervical back: Normal range of motion and neck supple.  Skin:    General: Skin is warm.  Neurological:     Mental Status: She is alert and oriented to person, place, and time.  Psychiatric:        Mood and Affect: Affect normal.     LABORATORY DATA:  I have reviewed the data as listed    Component Value Date/Time   NA 138 02/03/2022 1304   NA 141 06/03/2013 2039   K 3.9 02/03/2022 1304   K 3.7 06/03/2013 2039   CL 100 02/03/2022 1304   CL 106 06/03/2013 2039   CO2 27 02/03/2022 1304   CO2 30 06/03/2013 2039   GLUCOSE 253 (H) 02/03/2022 1304   GLUCOSE 59 (L) 06/03/2013 2039   BUN 14 02/03/2022 1304   BUN 6 (L) 06/03/2013 2039   CREATININE 0.80 02/03/2022 1304   CREATININE 0.59 (L) 06/03/2013 2039   CALCIUM 8.8 (L) 02/03/2022 1304   CALCIUM 8.8 06/03/2013 2039   PROT 7.9  11/17/2018 1313   PROT 8.8 (H) 06/03/2013 2039   ALBUMIN 3.6 11/17/2018 1313   ALBUMIN 3.6 06/03/2013 2039   AST 26 11/17/2018 1313   AST 38 (H) 06/03/2013 2039   ALT 24 11/17/2018 1313   ALT 44 06/03/2013 2039   ALKPHOS 118 11/17/2018 1313   ALKPHOS 134 (H) 06/03/2013 2039   BILITOT 0.7 11/17/2018 1313   BILITOT 0.4 06/03/2013 2039   GFRNONAA >60 02/03/2022 1304   GFRNONAA >60 06/03/2013 2039   GFRAA >60 07/05/2019 1239   GFRAA >60 06/03/2013 2039    No results found for: "SPEP", "UPEP"  Lab Results  Component Value Date   WBC 10.1 02/03/2022   NEUTROABS 7.2 02/03/2022   HGB 11.5 (L) 02/03/2022   HCT 37.5 02/03/2022   MCV 81.7 02/03/2022   PLT 428 (H) 02/03/2022      Chemistry      Component Value Date/Time   NA 138 02/03/2022 1304   NA 141 06/03/2013 2039   K 3.9 02/03/2022 1304   K 3.7 06/03/2013 2039   CL 100 02/03/2022 1304   CL 106 06/03/2013 2039   CO2 27 02/03/2022 1304   CO2 30 06/03/2013 2039   BUN 14 02/03/2022 1304   BUN 6 (L) 06/03/2013 2039   CREATININE 0.80 02/03/2022 1304   CREATININE 0.59 (L) 06/03/2013 2039      Component Value Date/Time   CALCIUM 8.8 (L) 02/03/2022 1304   CALCIUM 8.8 06/03/2013 2039   ALKPHOS 118 11/17/2018 1313   ALKPHOS 134 (H) 06/03/2013 2039   AST 26 11/17/2018 1313   AST 38 (H) 06/03/2013 2039   ALT 24 11/17/2018 1313   ALT 44 06/03/2013 2039   BILITOT 0.7 11/17/2018 1313   BILITOT 0.4 06/03/2013 2039       RADIOGRAPHIC STUDIES: I have personally reviewed the radiological images as listed and agreed with the findings in the report. No results found.   ASSESSMENT & PLAN:  Iron deficiency anemia due to chronic blood loss Iron deficiency anemia-question etiology. NOV 2022- Today 11.5  Iron studies pending.  Continue by mouth iron PO one a day.  No IV iron today.   # History of endometrial cancer status post surgery-no adjuvant therapy; recent CT -NEG; contonue follow up with Gyn, Dr.Schermerhorn. STABLE  #  Mild thrombocytosis: likely secondary; however awaiting MPN panel.  Monitor for now.    DISPOSITION: labs print out # NO infusion  #  follow-up in 6 moths- MD- iron studies-Ferritin/CBC BMP; possible ferrahem IV- Dr.B   Orders Placed This Encounter  Procedures   CBC with Differential/Platelet    Standing Status:   Future    Standing Expiration Date:   16/05/8451   Basic metabolic panel    Standing Status:   Future    Standing Expiration Date:   02/03/2023   Ferritin    Standing Status:   Future    Standing Expiration Date:   02/04/2023   Iron and TIBC([facility])    Standing Status:   Future    Standing Expiration Date:   02/04/2023    All questions were answered. The patient knows to call the clinic with any problems, questions or concerns.      Cammie Sickle, MD 02/03/2022 2:18 PM

## 2022-02-03 NOTE — Assessment & Plan Note (Addendum)
Iron deficiency anemia-question etiology. NOV 2022- Today 11.5  Iron studies pending.  Continue by mouth iron PO one a day.  No IV iron today.   # History of endometrial cancer status post surgery-no adjuvant therapy; recent CT -NEG; contonue follow up with Gyn, Dr.Schermerhorn. STABLE  # Mild thrombocytosis: likely secondary; however awaiting MPN panel.  Monitor for now.    DISPOSITION: labs print out # NO infusion  #  follow-up in 6 moths- MD- iron studies-Ferritin/CBC BMP; possible ferrahem IV- Dr.B

## 2022-02-04 ENCOUNTER — Encounter: Payer: Medicare Other | Admitting: Physical Therapy

## 2022-02-06 ENCOUNTER — Encounter: Payer: Medicare Other | Admitting: Physical Therapy

## 2022-02-07 LAB — MISC LABCORP TEST (SEND OUT): Labcorp test code: 489450

## 2022-02-11 ENCOUNTER — Ambulatory Visit: Payer: Medicare Other | Admitting: Physical Therapy

## 2022-02-11 LAB — MPL MUTATION ANALYSIS

## 2022-02-11 LAB — JAK2 GENOTYPR

## 2022-02-18 ENCOUNTER — Encounter: Payer: Self-pay | Admitting: Physical Therapy

## 2022-02-18 ENCOUNTER — Ambulatory Visit: Payer: Medicare Other | Attending: Orthopedic Surgery | Admitting: Physical Therapy

## 2022-02-18 DIAGNOSIS — M25612 Stiffness of left shoulder, not elsewhere classified: Secondary | ICD-10-CM | POA: Diagnosis present

## 2022-02-18 DIAGNOSIS — M25512 Pain in left shoulder: Secondary | ICD-10-CM | POA: Diagnosis present

## 2022-02-18 DIAGNOSIS — G8929 Other chronic pain: Secondary | ICD-10-CM | POA: Diagnosis present

## 2022-02-18 NOTE — Therapy (Signed)
OUTPATIENT PHYSICAL THERAPY SHOULDER EVALUATION   Patient Name: Christine Lawson MRN: [id number] DOB:08-Feb-1961, 61 y.o., female Today's Date: 02/18/2022   PT End of Session - 02/18/22 1432     Visit Number 6    Number of Visits 17    Date for PT Re-Evaluation 03/02/22    Authorization Type [employer] Medicare    Authorization - Visit Number 6    Authorization - Number of Visits 10    Progress Note Due on Visit 10    PT Start Time 1435    PT Stop Time 5643    PT Time Calculation (min) 39 min    Activity Tolerance Patient tolerated treatment well;No increased pain    Behavior During Therapy WFL for tasks assessed/performed                  Past Medical History:  Diagnosis Date   Diabetes mellitus without complication ([place])    Endometrial adenocarcinoma ([place])    GERD (gastroesophageal reflux disease)    Hematochezia    Hyperlipidemia    Hypertension    IDA (iron deficiency anemia)    Inguinal lymphadenopathy 07/13/2015   Irritable bowel syndrome    Obesity    Osteoarthritis    Sleep apnea    Past Surgical History:  Procedure Laterality Date   ABDOMINAL HYSTERECTOMY  feb 2013   CARPAL TUNNEL RELEASE Left 03/20/2021   Procedure: CARPAL TUNNEL RELEASE ENDOSCOPIC;  Surgeon: Corky Mull, MD;  Location: [facility] ORS;  Service: Orthopedics;  Laterality: Left;   COLONOSCOPY WITH PROPOFOL N/A 08/02/2015   Procedure: COLONOSCOPY WITH PROPOFOL;  Surgeon: Manya Silvas, MD;  Location: [facility] ENDOSCOPY;  Service: Endoscopy;  Laterality: N/A;   ESOPHAGOGASTRODUODENOSCOPY (EGD) WITH PROPOFOL N/A 08/02/2015   Procedure: ESOPHAGOGASTRODUODENOSCOPY (EGD) WITH PROPOFOL;  Surgeon: Manya Silvas, MD;  Location: [facility] ENDOSCOPY;  Service: Endoscopy;  Laterality: N/A;   Patient Active Problem List   Diagnosis Date Noted   Hepatic steatosis 05/21/2019   Other proteinuria 01/25/2019   Vitamin D deficiency 04/13/2018   Gastroesophageal reflux disease with esophagitis 01/22/2018   Irritable  bowel syndrome with diarrhea 04/03/2016   Lymphedema 01/17/2016   Venous (peripheral) insufficiency 01/17/2016   Pain in limb 01/17/2016   Iron deficiency anemia due to chronic blood loss 01/09/2016   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   History of endometrial cancer 08/01/2015   Endometrial adenocarcinoma ([place]) 07/13/2015    Class: History of   Inguinal lymphadenopathy 07/13/2015   Long term current use of insulin ([place]) 12/12/2013   Microalbuminuria 12/12/2013   Diabetes ([place]) 07/28/2013   BP (high blood pressure) 07/28/2013   HLD (hyperlipidemia) 07/28/2013   Adiposity 07/28/2013   Obstructive apnea 07/28/2013   Arthritis, degenerative 07/28/2013   Apnea, sleep 07/28/2013   Thyroid nodule 07/28/2013    PCP: Ginette Pitman MD  REFERRING PROVIDER: Arvella Nigh PA  REFERRING DIAG: L shoulder pain   THERAPY DIAG:  Chronic left shoulder pain  Stiffness of left shoulder, not elsewhere classified  Rationale for Evaluation and Treatment: Rehabilitation  ONSET DATE: unable to specify  SUBJECTIVE:  SUBJECTIVE STATEMENT: Pt reports 6-7/10 pain today. She reports compliance with HEP. Shoulder is feeling pretty good overall  PERTINENT HISTORY: Pt returns to this clinic for evaluation of L shoulder pain. Ceased past PT for the shoulder after 4 visits d/t increased knee pain becoming more of an issue she preferred to be seen for. Her L shoulder pain has been the same since June 2023. Reports her pain is along the top anterior part of the shoulder. Reports current 6/10 shoulder pain; worst 8/10; best 5/10. Pain is increased by lifting, carrying, and overhead reach. Has some trouble pushing up from a chair and pushing her doors open. Pain is alleviated by rest. Patient is unemployed, enjoys playing games with her sister,  reading/writing in her Bible, and going to church. Pt denies N/V, B&B changes, unexplained weight fluctuation, saddle paresthesia, fever, night sweats, or unrelenting night pain at this time.   PAIN:  Are you having pain? Yes: NPRS scale: 6/10 Pain location: L anterior/ superior shoulder Pain description: tight tension Aggravating factors: lifting, carrying, and overhead reach. Has some trouble pushing up from a chair and pushing her doors open.  Relieving factors: relaxing  PRECAUTIONS: None  WEIGHT BEARING RESTRICTIONS: No  FALLS:  Has patient fallen in last 6 months? Yes. Number of falls 1  LIVING ENVIRONMENT: Lives with: lives with their family Lives in: House/apartment Stairs: No Has following equipment at home: Single point cane  OCCUPATION: unemployeed  PLOF: Independent with household mobility with device  PATIENT GOALS:Increase my shoulder motion and strength  OBJECTIVE:   DIAGNOSTIC FINDINGS:  XRAY  PATIENT SURVEYS:  FOTO with goal of  COGNITION: Overall cognitive status: Within functional limits for tasks assessed     SENSATION: WFL  POSTURE: Wide BOS, upper crossed  UPPER EXTREMITY ROM:   Active / PassiveROM Right eval Left eval  Shoulder flexion 138/143 126/130  Shoulder extension 44/not tested 41/not tested  Shoulder abduction 118/142 108/130  Shoulder adduction    Shoulder internal rotation L1/90 L1/82  Shoulder external rotation (apleys) C4/82 C2/80  Elbow flexion WNL WNL  Elbow extension WNL WNL  Wrist flexion    Wrist extension    Wrist ulnar deviation    Wrist radial deviation    Wrist pronation    Wrist supination    (Blank rows = not tested)  UPPER EXTREMITY MMT:  MMT Right eval Left eval  Shoulder flexion 5 4+  Shoulder extension 5 5  Shoulder abduction 5 4+  Shoulder adduction    Shoulder internal rotation 4+ 4-  Shoulder external rotation 4+ 4+  Middle trapezius 2- 2-  Lower trapezius 2- 2-  Elbow flexion 5 5   Elbow extension 5 5  Wrist flexion    Wrist extension    Wrist ulnar deviation    Wrist radial deviation    Wrist pronation    Wrist supination    Grip strength (lbs)    (Blank rows = not tested)  SHOULDER SPECIAL TESTS: Impingement tests: Neer impingement test: positive , Hawkins/Kennedy impingement test: positive , and Painful arc test: positive  impingement tests positive bilat with more pain with testing on RUE SLAP lesions: Crank test: negative and Biceps load test: negative  Rotator cuff assessment: Drop arm test: positive , Empty can test: positive , Full can test: negative, External rotation lag sign: negative, Internal rotation lag sign: positive , Gerber lift off test: negative, and Infraspinatus test: positive    JOINT MOBILITY TESTING:  Guarding throughout  PALPATION:  Concordant pain  sign to subacromial space, supraspinatus and infraspinatus insertion    TODAY'S TREATMENT:                                                                                                                                          UBE 32mn fwd 234m bwd L2 for low level strengthening and mobility  Landmine arom overhead flex x15 2sec hold in mx flex Seated shoulder press 4# DB x15; 5# DB x12 Y on wall x15 with good carry over of proper technique of therex Overhead wt'd ball bounces on wall 2x 30sec Overhead ball slam 15# 2x 6 with cuing for technique for starting position with good carry over Push up push with clap on wall 2x 6 with good carry over of technique  PATIENT EDUCATION: Education details: Patient was educated on diagnosis, anatomy and pathology involved, prognosis, role of PT, and was given an HEP, demonstrating exercise with proper form following verbal and tactile cues, and was given a paper hand out to continue exercise at home. Pt was educated on and agreed to plan of care.  Person educated: Patient Education method: Explanation, Demonstration, and Verbal cues Education  comprehension: verbalized understanding, returned demonstration, and verbal cues required  HOME EXERCISE PROGRAM: Access Code: [email or web address]  - Seated Shoulder Flexion AAROM with Pulley Behind  - 1-3 x daily - 7 x weekly - 12-20 reps - Seated Shoulder Abduction AAROM with Pulley Behind  - 1-3 x daily - 7 x weekly - 12-20 reps - Seated Scapular Retraction  - 8 x daily - 7 x weekly - 12-20 reps  ASSESSMENT:  CLINICAL IMPRESSION:  PT continued and progressed therapeutic exercise to improve strength and mobility of L shoulder. Pt is able to tolerate increased power progression with safety as well. Pt was able to complete exercises with no increase in L shoulder pain at the end of session. Pt is able to comply with cuing for proper technique of therex. Patient with good effort throughout session. PT will continue progression as able.    OBJECTIVE IMPAIRMENTS: Abnormal gait, decreased activity tolerance, decreased endurance, decreased mobility, difficulty walking, decreased ROM, decreased strength, increased fascial restrictions, impaired perceived functional ability, increased muscle spasms, impaired flexibility, impaired sensation, impaired tone, impaired UE functional use, improper body mechanics, postural dysfunction, and pain.   ACTIVITY LIMITATIONS: carrying, lifting, sitting, standing, squatting, transfers, bed mobility, bathing, dressing, and reach over head  PARTICIPATION LIMITATIONS: cleaning, driving, shopping, community activity, and yard work  PERSONAL FACTORS: Age, Education, Fitness, Past/current experiences, Sex, Social background, Time since onset of injury/illness/exacerbation, Transportation, and 3+ comorbidities: BP, HLD, DM, obesity, chronic pain  are also affecting patient's functional outcome.   REHAB POTENTIAL: Good  CLINICAL DECISION MAKING: Evolving/moderate complexity  EVALUATION COMPLEXITY: Moderate   GOALS: Goals reviewed with patient? Yes  SHORT TERM GOALS: Target  date: 03/18/2022  (Remove Blue Hyperlink)  Pt will be independent with  HEP in order to improve strength and balance in order to decrease fall risk and improve function at home and work.  Baseline: HEP given Goal status: INITIAL   LONG TERM GOALS: Target date: 04/15/2022  (Remove Blue Hyperlink)  Pt will decrease worst pain as reported on NPRS by at least 3 points in order to demonstrate clinically significant reduction in pain.  Baseline: 8/10 Goal status: INITIAL  2.  Patient will increase FOTO score to 58 to demonstrate predicted increase in functional mobility to complete ADLs  Baseline: 50 Goal status: INITIAL  3.  Patient will demonstrate bilat overhead flex of 140d in order to complete overhead reaching ADLs Baseline: R/L AROM 138/126 Goal status: INITIAL  4.  Patient will be able to place 15# item on a shelf overhead to demonstrate ability to place groceries in cupboard/fridge Baseline: unable to lift any amount overhead Goal status: INITIAL  5.  Pt will be able to carry 10# in left hand for at least 173f in order to demonstrate carrying items into her home Baseline: unable to carry  Goal status: INITIAL   PLAN:  PT FREQUENCY: 1-2x/week  PT DURATION: 12 weeks  PLANNED INTERVENTIONS: Therapeutic exercises, Therapeutic activity, Neuromuscular re-education, Balance training, Gait training, Patient/Family education, Self Care, Joint mobilization, Joint manipulation, Stair training, Vestibular training, DME instructions, Dry Needling, Electrical stimulation, Spinal manipulation, Spinal mobilization, Cryotherapy, and Moist heat  PLAN FOR NEXT SESSION:L shoulder mobility, scapulohumeral rhythm   CDurwin RegesDPT CDurwin Reges PT 02/18/2022, 3:13 PM

## 2022-02-26 ENCOUNTER — Ambulatory Visit: Payer: Medicare Other | Admitting: Physical Therapy

## 2022-03-05 ENCOUNTER — Encounter: Payer: Self-pay | Admitting: Internal Medicine

## 2022-03-18 ENCOUNTER — Ambulatory Visit: Payer: 59 | Admitting: Physical Therapy

## 2022-03-20 ENCOUNTER — Encounter: Payer: Self-pay | Admitting: Internal Medicine

## 2022-03-25 ENCOUNTER — Ambulatory Visit: Payer: 59 | Attending: Orthopedic Surgery | Admitting: Physical Therapy

## 2022-03-25 ENCOUNTER — Encounter: Payer: Self-pay | Admitting: Physical Therapy

## 2022-03-25 DIAGNOSIS — M25512 Pain in left shoulder: Secondary | ICD-10-CM | POA: Insufficient documentation

## 2022-03-25 DIAGNOSIS — G8929 Other chronic pain: Secondary | ICD-10-CM | POA: Insufficient documentation

## 2022-03-25 DIAGNOSIS — M25612 Stiffness of left shoulder, not elsewhere classified: Secondary | ICD-10-CM | POA: Insufficient documentation

## 2022-03-25 NOTE — Therapy (Signed)
OUTPATIENT PHYSICAL THERAPY SHOULDER EVALUATION   Patient Name: Christine Lawson MRN: [id number] DOB:05/09/1960, 62 y.o., female Today's Date: 03/25/2022   PT End of Session - 03/25/22 1448     Visit Number 7    Number of Visits 17    Date for PT Re-Evaluation 05/09/22    Authorization Type [employer]    Authorization - Visit Number 7    Authorization - Number of Visits 10    Progress Note Due on Visit 10    PT Start Time 3220    PT Stop Time 1510    PT Time Calculation (min) 38 min    Activity Tolerance Patient tolerated treatment well;No increased pain    Behavior During Therapy WFL for tasks assessed/performed                   Past Medical History:  Diagnosis Date   Diabetes mellitus without complication ([place])    Endometrial adenocarcinoma ([place])    GERD (gastroesophageal reflux disease)    Hematochezia    Hyperlipidemia    Hypertension    IDA (iron deficiency anemia)    Inguinal lymphadenopathy 07/13/2015   Irritable bowel syndrome    Obesity    Osteoarthritis    Sleep apnea    Past Surgical History:  Procedure Laterality Date   ABDOMINAL HYSTERECTOMY  feb 2013   CARPAL TUNNEL RELEASE Left 03/20/2021   Procedure: CARPAL TUNNEL RELEASE ENDOSCOPIC;  Surgeon: Corky Mull, MD;  Location: [facility] ORS;  Service: Orthopedics;  Laterality: Left;   COLONOSCOPY WITH PROPOFOL N/A 08/02/2015   Procedure: COLONOSCOPY WITH PROPOFOL;  Surgeon: Manya Silvas, MD;  Location: [facility] ENDOSCOPY;  Service: Endoscopy;  Laterality: N/A;   ESOPHAGOGASTRODUODENOSCOPY (EGD) WITH PROPOFOL N/A 08/02/2015   Procedure: ESOPHAGOGASTRODUODENOSCOPY (EGD) WITH PROPOFOL;  Surgeon: Manya Silvas, MD;  Location: [facility] ENDOSCOPY;  Service: Endoscopy;  Laterality: N/A;   Patient Active Problem List   Diagnosis Date Noted   Hepatic steatosis 05/21/2019   Other proteinuria 01/25/2019   Vitamin D deficiency 04/13/2018   Gastroesophageal reflux disease with esophagitis 01/22/2018   Irritable  bowel syndrome with diarrhea 04/03/2016   Lymphedema 01/17/2016   Venous (peripheral) insufficiency 01/17/2016   Pain in limb 01/17/2016   Iron deficiency anemia due to chronic blood loss 01/09/2016   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   History of endometrial cancer 08/01/2015   Endometrial adenocarcinoma ([place]) 07/13/2015    Class: History of   Inguinal lymphadenopathy 07/13/2015   Long term current use of insulin ([place]) 12/12/2013   Microalbuminuria 12/12/2013   Diabetes ([place]) 07/28/2013   BP (high blood pressure) 07/28/2013   HLD (hyperlipidemia) 07/28/2013   Adiposity 07/28/2013   Obstructive apnea 07/28/2013   Arthritis, degenerative 07/28/2013   Apnea, sleep 07/28/2013   Thyroid nodule 07/28/2013    PCP: Ginette Pitman MD  REFERRING PROVIDER: Arvella Nigh PA  REFERRING DIAG: L shoulder pain   THERAPY DIAG:  Chronic left shoulder pain  Stiffness of left shoulder, not elsewhere classified  Rationale for Evaluation and Treatment: Rehabilitation  ONSET DATE: unable to specify  SUBJECTIVE:  SUBJECTIVE STATEMENT: Pt reports 6-7/10 pain today. She reports compliance with HEP. Shoulder is feeling pretty good overall  PERTINENT HISTORY: Pt returns to this clinic for evaluation of L shoulder pain. Ceased past PT for the shoulder after 4 visits d/t increased knee pain becoming more of an issue she preferred to be seen for. Her L shoulder pain has been the same since June 2023. Reports her pain is along the top anterior part of the shoulder. Reports current 6/10 shoulder pain; worst 8/10; best 5/10. Pain is increased by lifting, carrying, and overhead reach. Has some trouble pushing up from a chair and pushing her doors open. Pain is alleviated by rest. Patient is unemployed, enjoys playing games with her sister,  reading/writing in her Bible, and going to church. Pt denies N/V, B&B changes, unexplained weight fluctuation, saddle paresthesia, fever, night sweats, or unrelenting night pain at this time.   PAIN:  Are you having pain? Yes: NPRS scale: 6/10 Pain location: L anterior/ superior shoulder Pain description: tight tension Aggravating factors: lifting, carrying, and overhead reach. Has some trouble pushing up from a chair and pushing her doors open.  Relieving factors: relaxing  PRECAUTIONS: None  WEIGHT BEARING RESTRICTIONS: No  FALLS:  Has patient fallen in last 6 months? Yes. Number of falls 1  LIVING ENVIRONMENT: Lives with: lives with their family Lives in: House/apartment Stairs: No Has following equipment at home: Single point cane  OCCUPATION: unemployeed  PLOF: Independent with household mobility with device  PATIENT GOALS:Increase my shoulder motion and strength  OBJECTIVE:   DIAGNOSTIC FINDINGS:  XRAY  PATIENT SURVEYS:  FOTO with goal of  COGNITION: Overall cognitive status: Within functional limits for tasks assessed     SENSATION: WFL  POSTURE: Wide BOS, upper crossed  UPPER EXTREMITY ROM:   Active / PassiveROM Right eval Left eval  Shoulder flexion 138/143 126/130  Shoulder extension 44/not tested 41/not tested  Shoulder abduction 118/142 108/130  Shoulder adduction    Shoulder internal rotation L1/90 L1/82  Shoulder external rotation (apleys) C4/82 C2/80  Elbow flexion WNL WNL  Elbow extension WNL WNL  Wrist flexion    Wrist extension    Wrist ulnar deviation    Wrist radial deviation    Wrist pronation    Wrist supination    (Blank rows = not tested)  UPPER EXTREMITY MMT:  MMT Right eval Left eval  Shoulder flexion 5 4+  Shoulder extension 5 5  Shoulder abduction 5 4+  Shoulder adduction    Shoulder internal rotation 4+ 4-  Shoulder external rotation 4+ 4+  Middle trapezius 2- 2-  Lower trapezius 2- 2-  Elbow flexion 5 5   Elbow extension 5 5  Wrist flexion    Wrist extension    Wrist ulnar deviation    Wrist radial deviation    Wrist pronation    Wrist supination    Grip strength (lbs)    (Blank rows = not tested)  SHOULDER SPECIAL TESTS: Impingement tests: Neer impingement test: positive , Hawkins/Kennedy impingement test: positive , and Painful arc test: positive  impingement tests positive bilat with more pain with testing on RUE SLAP lesions: Crank test: negative and Biceps load test: negative  Rotator cuff assessment: Drop arm test: positive , Empty can test: positive , Full can test: negative, External rotation lag sign: negative, Internal rotation lag sign: positive , Gerber lift off test: negative, and Infraspinatus test: positive    JOINT MOBILITY TESTING:  Guarding throughout  PALPATION:  Concordant pain  sign to subacromial space, supraspinatus and infraspinatus insertion    TODAY'S TREATMENT:                                                                                                                                          UBE 63mn fwd 256m bwd L2 for low level strengthening and mobility   Overhead 15# x1; 20# x1 raise Farmers carry 10# 10076fith SPC in RUE with ease  PT reviewed the following HEP with patient with patient able to demonstrate a set of the following with min cuing for correction needed. PT educated patient on parameters of therex (how/when to inc/decrease intensity, frequency, rep/set range, stretch hold time, and purpose of therex) with verbalized understanding.   - Seated Overhead Press with Dumbbells  - 1-2 x weekly - 3 sets - 10-12 reps - Seated Chest Press with Dumbbells with PLB  - 1-2 x weekly - 3 sets - 10-12 reps - Standing Shoulder External Rotation with Resistance  - 1-2 x weekly - 3 sets - 10-12 reps - Standing Full Range Shoulder Flexion with Dumbbells  - 1-2 x weekly - 3 sets - 8-12 reps - Low Trap Setting at [employer] 1-2 x weekly - 3 sets - 10-12  reps  PATIENT EDUCATION: Education details: Patient was educated on diagnosis, anatomy and pathology involved, prognosis, role of PT, and was given an HEP, demonstrating exercise with proper form following verbal and tactile cues, and was given a paper hand out to continue exercise at home. Pt was educated on and agreed to plan of care.  Person educated: Patient Education method: Explanation, Demonstration, and Verbal cues Education comprehension: verbalized understanding, returned demonstration, and verbal cues required  HOME EXERCISE PROGRAM: Access Code: [email or web address]  - Seated Shoulder Flexion AAROM with Pulley Behind  - 1-3 x daily - 7 x weekly - 12-20 reps - Seated Shoulder Abduction AAROM with Pulley Behind  - 1-3 x daily - 7 x weekly - 12-20 reps - Seated Scapular Retraction  - 8 x daily - 7 x weekly - 12-20 reps  ASSESSMENT:  CLINICAL IMPRESSION:  PT reassessed goals this session where patient has met all goals to safely d/c formal PT. Patient is able to demonstrate and verbalize understanding of all HEP recommendations with minimal corrections needed. Pt given clinic contact info should further questions or concerns arise. Pt to d/c PT.     OBJECTIVE IMPAIRMENTS: Abnormal gait, decreased activity tolerance, decreased endurance, decreased mobility, difficulty walking, decreased ROM, decreased strength, increased fascial restrictions, impaired perceived functional ability, increased muscle spasms, impaired flexibility, impaired sensation, impaired tone, impaired UE functional use, improper body mechanics, postural dysfunction, and pain.   ACTIVITY LIMITATIONS: carrying, lifting, sitting, standing, squatting, transfers, bed mobility, bathing, dressing, and reach over head  PARTICIPATION LIMITATIONS: cleaning, driving, shopping, community activity, and yard work  PERSONAL FACTORS: Age, Education, Fitness, Past/current experiences, Sex,  Social background, Time since onset of  injury/illness/exacerbation, Transportation, and 3+ comorbidities: BP, HLD, DM, obesity, chronic pain  are also affecting patient's functional outcome.   REHAB POTENTIAL: Good  CLINICAL DECISION MAKING: Evolving/moderate complexity  EVALUATION COMPLEXITY: Moderate   GOALS: Goals reviewed with patient? Yes  SHORT TERM GOALS: Target date: 04/22/2022  (Remove Blue Hyperlink)  Pt will be independent with HEP in order to improve strength and balance in order to decrease fall risk and improve function at home and work.  Baseline: HEP given Goal status: INITIAL   LONG TERM GOALS: Target date: 05/20/2022  (Remove Blue Hyperlink)  Pt will decrease worst pain as reported on NPRS by at least 3 points in order to demonstrate clinically significant reduction in pain.  Baseline: 8/10; 03/25/22 6/10 Goal status: Not met  2.  Patient will increase FOTO score to 58 to demonstrate predicted increase in functional mobility to complete ADLs  Baseline: 50 03/25/22 68 Goal status: MET  3.  Patient will demonstrate bilat overhead flex of 140d in order to complete overhead reaching ADLs Baseline: R/L AROM 138/126; 03/25/22 141/140 Goal status: MET  4.  Patient will be able to place 15# item on a shelf overhead to demonstrate ability to place groceries in cupboard/fridge Baseline: unable to lift any amount overhead 03/25/22 able to lift 20# overhead Goal status: MET  5.  Pt will be able to carry 10# in left hand for at least 171f in order to demonstrate carrying items into her home Baseline: unable to carry ; 03/25/22 able to complete with no increased pain  Goal status: MET   PLAN:  PT FREQUENCY: 1-2x/week  PT DURATION: 12 weeks  PLANNED INTERVENTIONS: Therapeutic exercises, Therapeutic activity, Neuromuscular re-education, Balance training, Gait training, Patient/Family education, Self Care, Joint mobilization, Joint manipulation, Stair training, Vestibular training, DME instructions, Dry  Needling, Electrical stimulation, Spinal manipulation, Spinal mobilization, Cryotherapy, and Moist heat  PLAN FOR NEXT SESSION:L shoulder mobility, scapulohumeral rhythm   CDurwin RegesDPT CDurwin Reges PT 03/25/2022, 3:09 PM

## 2022-03-31 ENCOUNTER — Ambulatory Visit: Payer: 59 | Admitting: Physical Therapy

## 2022-03-31 ENCOUNTER — Encounter: Payer: Self-pay | Admitting: Internal Medicine

## 2022-04-02 ENCOUNTER — Ambulatory Visit: Payer: 59 | Admitting: Physical Therapy

## 2022-04-08 ENCOUNTER — Ambulatory Visit: Payer: 59 | Admitting: Physical Therapy

## 2022-04-08 ENCOUNTER — Encounter: Payer: Self-pay | Admitting: Physical Therapy

## 2022-04-08 ENCOUNTER — Ambulatory Visit: Payer: 59 | Attending: Orthopedic Surgery | Admitting: Physical Therapy

## 2022-04-08 DIAGNOSIS — M25561 Pain in right knee: Secondary | ICD-10-CM | POA: Insufficient documentation

## 2022-04-08 DIAGNOSIS — G8929 Other chronic pain: Secondary | ICD-10-CM | POA: Diagnosis present

## 2022-04-08 NOTE — Therapy (Incomplete)
OUTPATIENT PHYSICAL THERAPY LOWER EXTREMITY EVALUATION   Patient Name: Christine Lawson MRN: [id number] DOB:08-20-1960, 62 y.o., female Today's Date: 04/08/2022  END OF SESSION:  PT End of Session - 04/08/22 1610     Visit Number 1    Number of Visits 17    Date for PT Re-Evaluation 05/09/22    PT Start Time 1436    PT Stop Time 1522    PT Time Calculation (min) 46 min    Activity Tolerance Patient tolerated treatment well    Behavior During Therapy [facility] for tasks assessed/performed             Past Medical History:  Diagnosis Date   Diabetes mellitus without complication ([place])    Endometrial adenocarcinoma ([place])    GERD (gastroesophageal reflux disease)    Hematochezia    Hyperlipidemia    Hypertension    IDA (iron deficiency anemia)    Inguinal lymphadenopathy 07/13/2015   Irritable bowel syndrome    Obesity    Osteoarthritis    Sleep apnea    Past Surgical History:  Procedure Laterality Date   ABDOMINAL HYSTERECTOMY  feb 2013   CARPAL TUNNEL RELEASE Left 03/20/2021   Procedure: CARPAL TUNNEL RELEASE ENDOSCOPIC;  Surgeon: Corky Mull, MD;  Location: [facility] ORS;  Service: Orthopedics;  Laterality: Left;   COLONOSCOPY WITH PROPOFOL N/A 08/02/2015   Procedure: COLONOSCOPY WITH PROPOFOL;  Surgeon: Manya Silvas, MD;  Location: [facility] ENDOSCOPY;  Service: Endoscopy;  Laterality: N/A;   ESOPHAGOGASTRODUODENOSCOPY (EGD) WITH PROPOFOL N/A 08/02/2015   Procedure: ESOPHAGOGASTRODUODENOSCOPY (EGD) WITH PROPOFOL;  Surgeon: Manya Silvas, MD;  Location: [facility] ENDOSCOPY;  Service: Endoscopy;  Laterality: N/A;   Patient Active Problem List   Diagnosis Date Noted   Hepatic steatosis 05/21/2019   Other proteinuria 01/25/2019   Vitamin D deficiency 04/13/2018   Gastroesophageal reflux disease with esophagitis 01/22/2018   Irritable bowel syndrome with diarrhea 04/03/2016   Lymphedema 01/17/2016   Venous (peripheral) insufficiency 01/17/2016   Pain in limb 01/17/2016   Iron  deficiency anemia due to chronic blood loss 01/09/2016   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   History of endometrial cancer 08/01/2015   Endometrial adenocarcinoma ([place]) 07/13/2015    Class: History of   Inguinal lymphadenopathy 07/13/2015   Long term current use of insulin ([place]) 12/12/2013   Microalbuminuria 12/12/2013   Diabetes ([place]) 07/28/2013   BP (high blood pressure) 07/28/2013   HLD (hyperlipidemia) 07/28/2013   Adiposity 07/28/2013   Obstructive apnea 07/28/2013   Arthritis, degenerative 07/28/2013   Apnea, sleep 07/28/2013   Thyroid nodule 07/28/2013    PCP: Tracie Harrier, MD  REFERRING PROVIDER: Tracie Harrier, MD  REFERRING DIAG: OA of right knee  THERAPY DIAG:  Chronic pain of right knee  Rationale for Evaluation and Treatment: Rehabilitation  ONSET DATE: Fall of 2023  SUBJECTIVE:   SUBJECTIVE STATEMENT: R knee pain  PERTINENT HISTORY: Pt is a 62 y.o. female who presents with chronic R knee pain that started in the Fall of 2023. Pt reports pain during walking and climbing stairs. Pt cannot recall a particular incidence that brought on her R knee pain. Pt states the pain has worsened since onset. Pt reports it's hard to WB and move around on the R knee. She recalls one instance where she had pain in her L knee at night and woke up the next morning with swelling in her calf and ankle. Mechanical sx present especially with walking, stairs, and getting in and out of the car. Pt  mentions rest, ice, heat, and Tylenol provide temporary relief for her pain. No falls in the past 6 months. Pt is on disability and enjoys reading the Bible, talking on the phone, watching movies, and singing with her sister, [name]. Pt denies N/V, B&B changes, unexplained weight fluctuation, saddle paresthesia, fever, night sweats, or unrelenting night pain at this time.    Takes time to get knee into extension  Has been woken up from sleep but has been able to go back to sleep Feels like  it's going to give out  Pain location: wraps all around knee Achy pain  When she's on it for a long time she has pain Wakes up with a bit of knee of stiffness; and goes down with walking and moving around  PAIN:  Are you having pain? Yes: NPRS scale: C - 7.5/10; W - 9.5/10 (up on it and walking); B - 5/10 Pain location: *** Pain description: Achy Aggravating factors: *** Relieving factors: Propping it up; ice and heat; Tylenol  PRECAUTIONS: {Therapy precautions:24002}  WEIGHT BEARING RESTRICTIONS: {Yes ***/No:24003}  FALLS:  Has patient fallen in last 6 months? No  LIVING ENVIRONMENT: Lives with: {OPRC lives with:25569::"lives with their family"} Lives in: {Lives in:25570} Stairs: {opstairs:27293} Has following equipment at home: {Assistive devices:23999} Handicap accessible apt complex Does not use SPC for short distances inside the apt Diabetes and takes a shot for it Hx of cancer (10 years in remission)  OCCUPATION: ***  PLOF: {PLOF:24004}  PATIENT GOALS: Get back to ADLs pain-free  NEXT MD VISIT: ***  OBJECTIVE:   DIAGNOSTIC FINDINGS: ***  PATIENT SURVEYS:  {rehab surveys:24030}  COGNITION: Overall cognitive status: {cognition:24006}     SENSATION: {sensation:27233}  EDEMA:  {edema:24020}  MUSCLE LENGTH: Hamstrings: Right *** deg; Left *** deg Thomas test: Right *** deg; Left *** deg  POSTURE: {posture:25561}  PALPATION: No joint line tenderness present  LOWER EXTREMITY ROM:  Active ROM Right eval Left eval  Hip flexion    Hip extension    Hip abduction    Hip adduction    Hip internal rotation    Hip external rotation    Knee flexion 52 63  Knee extension 7 7  Ankle dorsiflexion    Ankle plantarflexion    Ankle inversion    Ankle eversion     (Blank rows = not tested)  LOWER EXTREMITY MMT:  MMT Right eval Left eval  Hip flexion 4 4  Hip extension    Hip abduction    Hip adduction    Hip internal rotation    Hip external  rotation    Knee flexion    Knee extension 3+ 4  Ankle dorsiflexion 4 4  Ankle plantarflexion 5 5  Ankle inversion 5 5  Ankle eversion 5 5   (Blank rows = not tested) PAMs: lateral glide was painful (concordant); medial glide felt a little better with repeated  Tibiofemoral distracted felt good  Patellar mobs: superior and inferior glides were painful (concordant); lateral and medial glides felt good  LOWER EXTREMITY SPECIAL TESTS:  Knee special tests: Anterior drawer test: negative, Posterior drawer test: negative, Lachman Test: negative, and Apley's test: negative  FUNCTIONAL TESTS:  {Functional tests:24029}  GAIT: Distance walked: 10 meters Assistive device utilized: Single point cane Level of assistance: Complete Independence Comments: ***  12 fast  16 slow   TODAY'S TREATMENT:  DATE: ***    PATIENT EDUCATION:  Education details: *** Person educated: {Person educated:25204} Education method: {Education Method:25205} Education comprehension: {Education Comprehension:25206}  HOME EXERCISE PROGRAM: ***  ASSESSMENT:  CLINICAL IMPRESSION: Patient is a *** y.o. *** who was seen today for physical therapy evaluation and treatment for ***.   OBJECTIVE IMPAIRMENTS: {opptimpairments:25111}.   ACTIVITY LIMITATIONS: {activitylimitations:27494}  PARTICIPATION LIMITATIONS: {participationrestrictions:25113}  PERSONAL FACTORS: {Personal factors:25162} are also affecting patient's functional outcome.   REHAB POTENTIAL: {rehabpotential:25112}  CLINICAL DECISION MAKING: {clinical decision making:25114}  EVALUATION COMPLEXITY: {Evaluation complexity:25115}   GOALS: Goals reviewed with patient? {yes/no:20286}  SHORT TERM GOALS: Target date: *** *** Baseline: Goal status: {GOALSTATUS:25110}  2.  *** Baseline:  Goal status:  {GOALSTATUS:25110}  3.  *** Baseline:  Goal status: {GOALSTATUS:25110}  4.  *** Baseline:  Goal status: {GOALSTATUS:25110}  5.  *** Baseline:  Goal status: {GOALSTATUS:25110}  6.  *** Baseline:  Goal status: {GOALSTATUS:25110}  LONG TERM GOALS: Target date: ***  *** Baseline:  Goal status: {GOALSTATUS:25110}  2.  *** Baseline:  Goal status: {GOALSTATUS:25110}  3.  *** Baseline:  Goal status: {GOALSTATUS:25110}  4.  *** Baseline:  Goal status: {GOALSTATUS:25110}  5.  *** Baseline:  Goal status: {GOALSTATUS:25110}  6.  *** Baseline:  Goal status: {GOALSTATUS:25110}   PLAN:  PT FREQUENCY: {rehab frequency:25116}  PT DURATION: {rehab duration:25117}  PLANNED INTERVENTIONS: {rehab planned interventions:25118::"Therapeutic exercises","Therapeutic activity","Neuromuscular re-education","Balance training","Gait training","Patient/Family education","Self Care","Joint mobilization"}  PLAN FOR NEXT SESSION: Update HEP.    Stanford [place], Student-PT 04/08/2022, 4:39 PM

## 2022-04-09 NOTE — Therapy (Signed)
OUTPATIENT PHYSICAL THERAPY LOWER EXTREMITY EVALUATION   Patient Name: Christine Lawson MRN: [id number] DOB:09-09-1960, 62 y.o., female Today's Date: 04/09/2022  END OF SESSION:  PT End of Session - 04/08/22 1610     Visit Number 1    Number of Visits 17    Date for PT Re-Evaluation 05/09/22    PT Start Time 1436    PT Stop Time 1522    PT Time Calculation (min) 46 min    Activity Tolerance Patient tolerated treatment well    Behavior During Therapy [facility] for tasks assessed/performed             Past Medical History:  Diagnosis Date   Diabetes mellitus without complication ([place])    Endometrial adenocarcinoma ([place])    GERD (gastroesophageal reflux disease)    Hematochezia    Hyperlipidemia    Hypertension    IDA (iron deficiency anemia)    Inguinal lymphadenopathy 07/13/2015   Irritable bowel syndrome    Obesity    Osteoarthritis    Sleep apnea    Past Surgical History:  Procedure Laterality Date   ABDOMINAL HYSTERECTOMY  feb 2013   CARPAL TUNNEL RELEASE Left 03/20/2021   Procedure: CARPAL TUNNEL RELEASE ENDOSCOPIC;  Surgeon: Corky Mull, MD;  Location: [facility] ORS;  Service: Orthopedics;  Laterality: Left;   COLONOSCOPY WITH PROPOFOL N/A 08/02/2015   Procedure: COLONOSCOPY WITH PROPOFOL;  Surgeon: Manya Silvas, MD;  Location: [facility] ENDOSCOPY;  Service: Endoscopy;  Laterality: N/A;   ESOPHAGOGASTRODUODENOSCOPY (EGD) WITH PROPOFOL N/A 08/02/2015   Procedure: ESOPHAGOGASTRODUODENOSCOPY (EGD) WITH PROPOFOL;  Surgeon: Manya Silvas, MD;  Location: [facility] ENDOSCOPY;  Service: Endoscopy;  Laterality: N/A;   Patient Active Problem List   Diagnosis Date Noted   Hepatic steatosis 05/21/2019   Other proteinuria 01/25/2019   Vitamin D deficiency 04/13/2018   Gastroesophageal reflux disease with esophagitis 01/22/2018   Irritable bowel syndrome with diarrhea 04/03/2016   Lymphedema 01/17/2016   Venous (peripheral) insufficiency 01/17/2016   Pain in limb 01/17/2016   Iron  deficiency anemia due to chronic blood loss 01/09/2016   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   History of endometrial cancer 08/01/2015   Endometrial adenocarcinoma ([place]) 07/13/2015    Class: History of   Inguinal lymphadenopathy 07/13/2015   Long term current use of insulin ([place]) 12/12/2013   Microalbuminuria 12/12/2013   Diabetes ([place]) 07/28/2013   BP (high blood pressure) 07/28/2013   HLD (hyperlipidemia) 07/28/2013   Adiposity 07/28/2013   Obstructive apnea 07/28/2013   Arthritis, degenerative 07/28/2013   Apnea, sleep 07/28/2013   Thyroid nodule 07/28/2013    PCP: Tracie Harrier, MD  REFERRING PROVIDER: Tracie Harrier, MD  REFERRING DIAG: OA of right knee  THERAPY DIAG:  Chronic pain of right knee  Rationale for Evaluation and Treatment: Rehabilitation  ONSET DATE: Fall of 2023  SUBJECTIVE:   SUBJECTIVE STATEMENT: R knee pain  PERTINENT HISTORY: Pt is a 62 y.o. female who presents with chronic R knee pain that started in the Fall of 2023. Has done multiple bouts of PT with favorable results, then pain returns, per pt "because I don't keep up with my exercises". Pt reports pain during walking and climbing stairs. Pt cannot recall a particular incidence that brought on her R knee pain this episode. Pt reports it's intermittently felt like it's going to "give out". Pt states the pain has worsened since onset. Pt reports it's hard to WB and move around on the R knee. She recalls one instance where she had pain  in her R knee at night and woke up the next morning with swelling in her calf and ankle. No nocturnal awakening. Morning stiffness alleviates after about 30 mins of movement. Mechanical sx present especially with walking, stairs, and getting in and out of the car. ON/OFF: R knee pain comes on slowly and it takes a while for it to subside. Pt reports in a typical day she experiences pain when she's on it for a long time. Pt mentions rest, ice, heat, and Tylenol provide  temporary relief for her pain. No falls in the past 6 months. Pt ambulates using SPC with the exception of short distances inside her apartment. Pt is on disability and enjoys reading the Bible, talking on the phone, watching movies, and singing with her sister, Christine Lawson. PMH: diabetes (takes a shot to manage sx) and hx of CA (10 years in remission). Pt denies N/V, B&B changes, unexplained weight fluctuation, saddle paresthesia, fever, night sweats, or unrelenting night pain at this time.    PAIN:  Are you having pain? Yes: NPRS scale: C - 7.5/10; W - 9.5/10 (up on it and walking); B - 5/10 Pain location: Wraps around anterior and posterior aspects of R knee Pain description: Achy Aggravating factors: Walking, stairs, and getting in and out of the car Relieving factors: Propping it up; ice and heat; Tylenol  PRECAUTIONS: None  WEIGHT BEARING RESTRICTIONS: No  FALLS:  Has patient fallen in last 6 months? No  LIVING ENVIRONMENT: Lives with:  Sister Lives in: House/apartment Stairs: Handicap accessible apartment complex Has following equipment at home: Single point cane   OCCUPATION: Unemployed (on disability)  PLOF: Independent  PATIENT GOALS: Get back to ADLs pain-free.  NEXT MD VISIT: N/A  OBJECTIVE:   DIAGNOSTIC FINDINGS: N/A  PATIENT SURVEYS:  FOTO 101  COGNITION: Overall cognitive status: Within functional limits for tasks assessed     SENSATION: WFL  EDEMA:  N/A  MUSCLE LENGTH: Not assessed  POSTURE: rounded shoulders, forward head, increased lumbar lordosis, and flexed trunk   PALPATION: No joint line tenderness present  LOWER EXTREMITY ROM:  Active ROM Right eval Left eval  Hip flexion    Hip extension Unable to formally test- pt unwilling to lay prone- but likely at least 25% limited, visualized in gait and standing  Hip abduction    Hip adduction    Hip internal rotation    Hip external rotation    Knee flexion 52 63  Knee extension 7 7   Ankle dorsiflexion    Ankle plantarflexion    Ankle inversion    Ankle eversion     (Blank rows = not tested)  LOWER EXTREMITY MMT:  MMT Right eval Left eval  Hip flexion 4 4  Hip extension    Hip abduction    Hip adduction    Hip internal rotation    Hip external rotation    Knee flexion    Knee extension 3+ 4  Ankle dorsiflexion 4 4  Ankle plantarflexion 5 5  Ankle inversion 5 5  Ankle eversion 5 5   (Blank rows = not tested)  PAMs: - Tibiofemoral lateral glide: painful (concordant); hypomobile - Tibiofemoral medial glide: painful but felt a little better with repeated motion; hypomobile - Tibiofemoral distraction: pt experienced relief of sx - Patellar mobs:  - Superior and inferior glides: painful (concordant) - Lateral and medial glides: painful but felt better with repeated motion; hypomobile  LOWER EXTREMITY SPECIAL TESTS:  Knee special tests: Anterior drawer test: negative,  Posterior drawer test: negative, Lachman Test: negative, and Apley's test: negative Thessalys Negative  FUNCTIONAL TESTS:  10 meter walk test: self-selected - 0.63 m/s; fastest - 0.83 m/s  GAIT: Distance walked: 10 meters Assistive device utilized: Single point cane Level of assistance: Complete Independence Comments: Decreased step and stride length, ataxic gait - shuffled feet; minimal knee flexion   TODAY'S TREATMENT:                                                                                                                              DATE: 04/08/22  PT reviewed the following HEP with patient with patient able to demonstrate a set of the following with min cuing for correction needed. PT educated patient on parameters of therex (how/when to inc/decrease intensity, frequency, rep/set range, stretch hold time, and purpose of therex) with verbalized understanding.   Heel Slides x 12-20 reps LAQ with 5 sec hold x 10 reps HS stretch 3x x 30 sec hold (bilaterally)  PATIENT  EDUCATION:  Education details: Pt was educated on anatomical structures of the knee and why different positioning initiates symptoms. Pt also educated on hamstring stretch and adductor stretch which were given as HEP. Pt instructed and agreed to PT POC and treatment plan. Person educated: Patient Education method: Explanation, Demonstration, Tactile cues, Verbal cues, and Handouts Education comprehension: verbalized understanding, returned demonstration, and verbal cues required  HOME EXERCISE PROGRAM: Heel Slides x 12-20 reps LAQ with 5 sec hold x 10 reps HS stretch 3x x 30 sec hold (bilaterally)  ASSESSMENT:  CLINICAL IMPRESSION: Patient is a 62 y.o. female who was seen today for physical therapy evaluation and treatment for chronic R knee pain. Pt presents with S&S that are in alignment with R knee OA. Pt with impairments in decreased bilateral knee AROM, bilat hip and knee strength, gait abnormalities, decreased muscular endurance, decreased mobility, decreased motor control, and pain. Activity limitations in squatting, lifting, STS transfer, community ambulation, prolonged standing, stair negotiation. Participation limitations include functional ADLs that require WB on LLE. Would benefit from skilled PT to address aforementioned deficits and promote optimal return to PLOF and improve QoL.  OBJECTIVE IMPAIRMENTS: Abnormal gait, decreased activity tolerance, decreased balance, decreased endurance, decreased mobility, difficulty walking, decreased ROM, decreased strength, obesity, and pain.   ACTIVITY LIMITATIONS: carrying, lifting, standing, squatting, dressing, and locomotion level  PARTICIPATION LIMITATIONS: cleaning, community activity, and ADLs that require pt to WB on LLE  PERSONAL FACTORS: Fitness are also affecting patient's functional outcome.   REHAB POTENTIAL: Fair pt has taken ownership of not keeping up with her HEP after discharge so has a history of coming back   CLINICAL  DECISION MAKING: Stable/uncomplicated  EVALUATION COMPLEXITY: Low   GOALS: Goals reviewed with patient? Yes  SHORT TERM GOALS: Target date: 05/06/22 Pt will execute HEP independently with proper form and technique in order to return to PLOF at home. Baseline: HEP given on 04/08/22 Goal status: INITIAL   LONG  TERM GOALS: Target date: 06/03/22  Pt will increase FOTO score to 66 to demonstrate predicted increase in functional mobility to complete ADLs. Baseline: 52 Goal status: INITIAL  2.  Pt will demonstrate 10MWT speed of at least 1.0 m/s to demonstrate decreased fall risk and increased independence in community ambulation Baseline: self-selected - 0.63 m/s; fastest - 0.83 m/s Goal status: INITIAL  3.  Pt will demonstrate L knee mobility of 0-110d in order to be able to demonstrate normalized gait mechanics needed for household and community ambulation Baseline: 7-52d Goal status: INITIAL  4.  Pt will decrease worst pain as reported on NPS by at least 3 points in order to demonstrate clinically significant reduction in pain. Baseline: NPS - 9.5/10 Goal status: INITIAL       PLAN:  PT FREQUENCY: 2x/week  PT DURATION: 8 weeks  PLANNED INTERVENTIONS: Therapeutic exercises, Therapeutic activity, Neuromuscular re-education, Balance training, Gait training, Patient/Family education, and Stair training  PLAN FOR NEXT SESSION: Update HEP.  5xSTS test  Durwin Reges DPT Durwin Reges, PT 04/09/2022, 9:47 AM

## 2022-04-10 ENCOUNTER — Ambulatory Visit: Payer: 59 | Admitting: Physical Therapy

## 2022-04-15 ENCOUNTER — Ambulatory Visit: Payer: 59 | Admitting: Physical Therapy

## 2022-04-15 ENCOUNTER — Encounter: Payer: Self-pay | Admitting: Physical Therapy

## 2022-04-15 DIAGNOSIS — M25561 Pain in right knee: Secondary | ICD-10-CM | POA: Diagnosis not present

## 2022-04-15 DIAGNOSIS — G8929 Other chronic pain: Secondary | ICD-10-CM

## 2022-04-15 NOTE — Therapy (Signed)
OUTPATIENT PHYSICAL THERAPY LOWER EXTREMITY TREATMENT   Patient Name: Christine Lawson MRN: [id number] DOB:1961-02-13, 62 y.o., female Today's Date: 04/16/2022  END OF SESSION:  PT End of Session - 04/15/22 1620     Visit Number 2    Number of Visits 17    Date for PT Re-Evaluation 05/09/22    PT Start Time 1345    PT Stop Time 1430    PT Time Calculation (min) 45 min    Activity Tolerance Patient tolerated treatment well    Behavior During Therapy [facility] for tasks assessed/performed              Past Medical History:  Diagnosis Date   Diabetes mellitus without complication ([place])    Endometrial adenocarcinoma ([place])    GERD (gastroesophageal reflux disease)    Hematochezia    Hyperlipidemia    Hypertension    IDA (iron deficiency anemia)    Inguinal lymphadenopathy 07/13/2015   Irritable bowel syndrome    Obesity    Osteoarthritis    Sleep apnea    Past Surgical History:  Procedure Laterality Date   ABDOMINAL HYSTERECTOMY  feb 2013   CARPAL TUNNEL RELEASE Left 03/20/2021   Procedure: CARPAL TUNNEL RELEASE ENDOSCOPIC;  Surgeon: Corky Mull, MD;  Location: [facility] ORS;  Service: Orthopedics;  Laterality: Left;   COLONOSCOPY WITH PROPOFOL N/A 08/02/2015   Procedure: COLONOSCOPY WITH PROPOFOL;  Surgeon: Manya Silvas, MD;  Location: [facility] ENDOSCOPY;  Service: Endoscopy;  Laterality: N/A;   ESOPHAGOGASTRODUODENOSCOPY (EGD) WITH PROPOFOL N/A 08/02/2015   Procedure: ESOPHAGOGASTRODUODENOSCOPY (EGD) WITH PROPOFOL;  Surgeon: Manya Silvas, MD;  Location: [facility] ENDOSCOPY;  Service: Endoscopy;  Laterality: N/A;   Patient Active Problem List   Diagnosis Date Noted   Hepatic steatosis 05/21/2019   Other proteinuria 01/25/2019   Vitamin D deficiency 04/13/2018   Gastroesophageal reflux disease with esophagitis 01/22/2018   Irritable bowel syndrome with diarrhea 04/03/2016   Lymphedema 01/17/2016   Venous (peripheral) insufficiency 01/17/2016   Pain in limb 01/17/2016   Iron  deficiency anemia due to chronic blood loss 01/09/2016   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   History of endometrial cancer 08/01/2015   Endometrial adenocarcinoma ([place]) 07/13/2015    Class: History of   Inguinal lymphadenopathy 07/13/2015   Long term current use of insulin ([place]) 12/12/2013   Microalbuminuria 12/12/2013   Diabetes ([place]) 07/28/2013   BP (high blood pressure) 07/28/2013   HLD (hyperlipidemia) 07/28/2013   Adiposity 07/28/2013   Obstructive apnea 07/28/2013   Arthritis, degenerative 07/28/2013   Apnea, sleep 07/28/2013   Thyroid nodule 07/28/2013    PCP: Tracie Harrier, MD  REFERRING PROVIDER: Tracie Harrier, MD  REFERRING DIAG: OA of right knee  THERAPY DIAG:  Chronic pain of right knee  Rationale for Evaluation and Treatment: Rehabilitation  ONSET DATE: Fall of 2023  SUBJECTIVE:   SUBJECTIVE STATEMENT: Pt reports her R knee is feeling a "little achy". Pt states pain is more concentrated on the anterior aspect R knee. HEP is going okay but is unsure if she is executing them properly. NPS: 7.5/10 currently.   PERTINENT HISTORY: Pt is a 62 y.o. female who presents with chronic R knee pain that started in the Fall of 2023. Has done multiple bouts of PT with favorable results, then pain returns, per pt "because I don't keep up with my exercises". Pt reports pain during walking and climbing stairs. Pt cannot recall a particular incidence that brought on her R knee pain this episode. Pt reports it's  intermittently felt like it's going to "give out". Pt states the pain has worsened since onset. Pt reports it's hard to WB and move around on the R knee. She recalls one instance where she had pain in her R knee at night and woke up the next morning with swelling in her calf and ankle. No nocturnal awakening. Morning stiffness alleviates after about 30 mins of movement. Mechanical sx present especially with walking, stairs, and getting in and out of the car. ON/OFF: R knee  pain comes on slowly and it takes a while for it to subside. Pt reports in a typical day she experiences pain when she's on it for a long time. Pt mentions rest, ice, heat, and Tylenol provide temporary relief for her pain. No falls in the past 6 months. Pt ambulates using SPC with the exception of short distances inside her apartment. Pt is on disability and enjoys reading the Bible, talking on the phone, watching movies, and singing with her sister, Christine Lawson. PMH: diabetes (takes a shot to manage sx) and hx of CA (10 years in remission). Pt denies N/V, B&B changes, unexplained weight fluctuation, saddle paresthesia, fever, night sweats, or unrelenting night pain at this time.    PAIN:  Are you having pain? Yes: NPRS scale: C - 7.5/10; W - 9.5/10 (up on it and walking); B - 5/10 Pain location: Wraps around anterior and posterior aspects of R knee Pain description: Achy Aggravating factors: Walking, stairs, and getting in and out of the car Relieving factors: Propping it up; ice and heat; Tylenol  PRECAUTIONS: None  WEIGHT BEARING RESTRICTIONS: No  FALLS:  Has patient fallen in last 6 months? No  LIVING ENVIRONMENT: Lives with:  Sister Lives in: House/apartment Stairs: Handicap accessible apartment complex Has following equipment at home: Single point cane   OCCUPATION: Unemployed (on disability)  PLOF: Independent  PATIENT GOALS: Get back to ADLs pain-free.  NEXT MD VISIT: N/A  OBJECTIVE:   DIAGNOSTIC FINDINGS: N/A  PATIENT SURVEYS:  FOTO 51  COGNITION: Overall cognitive status: Within functional limits for tasks assessed     SENSATION: WFL  EDEMA:  N/A  MUSCLE LENGTH: Not assessed  POSTURE: rounded shoulders, forward head, increased lumbar lordosis, and flexed trunk   PALPATION: No joint line tenderness present  LOWER EXTREMITY ROM:  Active ROM Right eval Left eval  Hip flexion    Hip extension Unable to formally test- pt unwilling to lay prone- but  likely at least 25% limited, visualized in gait and standing  Hip abduction    Hip adduction    Hip internal rotation    Hip external rotation    Knee flexion 52 63  Knee extension 7 7  Ankle dorsiflexion    Ankle plantarflexion    Ankle inversion    Ankle eversion     (Blank rows = not tested)  LOWER EXTREMITY MMT:  MMT Right eval Left eval  Hip flexion 4 4  Hip extension    Hip abduction    Hip adduction    Hip internal rotation    Hip external rotation    Knee flexion    Knee extension 3+ 4  Ankle dorsiflexion 4 4  Ankle plantarflexion 5 5  Ankle inversion 5 5  Ankle eversion 5 5   (Blank rows = not tested)  PAMs: - Tibiofemoral lateral glide: painful (concordant); hypomobile - Tibiofemoral medial glide: painful but felt a little better with repeated motion; hypomobile - Tibiofemoral distraction: pt experienced relief of sx -  Patellar mobs:  - Superior and inferior glides: painful (concordant) - Lateral and medial glides: painful but felt better with repeated motion; hypomobile  LOWER EXTREMITY SPECIAL TESTS:  Knee special tests: Anterior drawer test: negative, Posterior drawer test: negative, Lachman Test: negative, and Apley's test: negative Thessalys Negative  FUNCTIONAL TESTS:  10 meter walk test: self-selected - 0.63 m/s; fastest - 0.83 m/s  GAIT: Distance walked: 10 meters Assistive device utilized: Single point cane Level of assistance: Complete Independence Comments: Decreased step and stride length, ataxic gait - shuffled feet; minimal knee flexion   TODAY'S TREATMENT:                                                                                                                              DATE: 04/15/22 - Nustep seat 6 UE 12 L3 5 mins for gentle RLE mobility and strengthening; SPM 80-85 throughout - Heel Slides x 12-20 reps (bilaterally) - LAQ with 5 sec hold x 10 reps - Standing marches at stairs with RUE support x 12 (bilaterally) - OMEGA Leg  Press 20# x 12 with cuing for eccentric control with good carry over - STS without UE support 2x 10 with VC and TC to maintain trunk upright - HS stretch 1 x 30 secs (bilaterally)  Last session: 04/08/22  Heel Slides x 12-20 reps LAQ with 5 sec hold x 10 reps HS stretch 3x x 30 sec hold (bilaterally)  PATIENT EDUCATION:  Education details: Pt was educated on anatomical structures of the knee and why different positioning initiates symptoms. Pt also educated on hamstring stretch and adductor stretch which were given as HEP. Pt instructed and agreed to PT POC and treatment plan. Person educated: Patient Education method: Explanation, Demonstration, Tactile cues, Verbal cues, and Handouts Education comprehension: verbalized understanding, returned demonstration, and verbal cues required  HOME EXERCISE PROGRAM: Heel Slides x 12-20 reps LAQ with 5 sec hold x 10 reps HS stretch 3x x 30 sec hold (bilaterally)  ASSESSMENT:  CLINICAL IMPRESSION: PT reviewed HEP with pt and pt demonstrated and verbalized thorough understanding. PT then initiated therex for increased R knee mobility and strength and progression toward normalized gait with success. Pt is able to demonstrate decent carry over with all demonstrations, VC, and TC given for therex. Pt is able to execute therex with proper form and technique and put forth great effort throughout session. No increase in pain through the entirety of the session. NPS: 4.5/10 at the end of the session. PT will continue progression as able. Pt would benefit from skilled PT to address aforementioned deficits and promote optimal return to PLOF and improve QoL.  OBJECTIVE IMPAIRMENTS: Abnormal gait, decreased activity tolerance, decreased balance, decreased endurance, decreased mobility, difficulty walking, decreased ROM, decreased strength, obesity, and pain.   ACTIVITY LIMITATIONS: carrying, lifting, standing, squatting, dressing, and locomotion  level  PARTICIPATION LIMITATIONS: cleaning, community activity, and ADLs that require pt to WB on LLE  PERSONAL FACTORS: Fitness are  also affecting patient's functional outcome.   REHAB POTENTIAL: Fair pt has taken ownership of not keeping up with her HEP after discharge so has a history of coming back   CLINICAL DECISION MAKING: Stable/uncomplicated  EVALUATION COMPLEXITY: Low   GOALS: Goals reviewed with patient? Yes  SHORT TERM GOALS: Target date: 05/06/22 Pt will execute HEP independently with proper form and technique in order to return to PLOF at home. Baseline: HEP given on 04/08/22 Goal status: INITIAL   LONG TERM GOALS: Target date: 06/03/22  Pt will increase FOTO score to 66 to demonstrate predicted increase in functional mobility to complete ADLs. Baseline: 52 Goal status: INITIAL  2.  Pt will demonstrate 10MWT speed of at least 1.0 m/s to demonstrate decreased fall risk and increased independence in community ambulation Baseline: self-selected - 0.63 m/s; fastest - 0.83 m/s Goal status: INITIAL  3.  Pt will demonstrate L knee mobility of 0-110d in order to be able to demonstrate normalized gait mechanics needed for household and community ambulation Baseline: 7-52d Goal status: INITIAL  4.  Pt will decrease worst pain as reported on NPS by at least 3 points in order to demonstrate clinically significant reduction in pain. Baseline: NPS - 9.5/10 Goal status: INITIAL       PLAN:  PT FREQUENCY: 2x/week  PT DURATION: 8 weeks  PLANNED INTERVENTIONS: Therapeutic exercises, Therapeutic activity, Neuromuscular re-education, Balance training, Gait training, Patient/Family education, and Stair training  PLAN FOR NEXT SESSION: Update HEP.  5xSTS test  Durwin Reges DPT Durwin Reges, PT 04/16/2022, 12:55 PM

## 2022-04-22 ENCOUNTER — Encounter: Payer: 59 | Admitting: Physical Therapy

## 2022-04-24 ENCOUNTER — Ambulatory Visit: Payer: 59 | Admitting: Physical Therapy

## 2022-04-24 ENCOUNTER — Encounter: Payer: Self-pay | Admitting: Physical Therapy

## 2022-04-24 DIAGNOSIS — G8929 Other chronic pain: Secondary | ICD-10-CM

## 2022-04-24 DIAGNOSIS — M25561 Pain in right knee: Secondary | ICD-10-CM | POA: Diagnosis not present

## 2022-04-24 NOTE — Therapy (Signed)
OUTPATIENT PHYSICAL THERAPY LOWER EXTREMITY TREATMENT   Patient Name: Christine Lawson MRN: [id number] DOB:June 23, 1960, 62 y.o., female Today's Date: 04/25/2022  END OF SESSION:  PT End of Session - 04/24/22 1515     Visit Number 3    Number of Visits 17    Date for PT Re-Evaluation 05/09/22    PT Start Time [id number]    PT Stop Time 1600    PT Time Calculation (min) 45 min    Activity Tolerance Patient tolerated treatment well    Behavior During Therapy [facility] for tasks assessed/performed               Past Medical History:  Diagnosis Date   Diabetes mellitus without complication ([place])    Endometrial adenocarcinoma ([place])    GERD (gastroesophageal reflux disease)    Hematochezia    Hyperlipidemia    Hypertension    IDA (iron deficiency anemia)    Inguinal lymphadenopathy 07/13/2015   Irritable bowel syndrome    Obesity    Osteoarthritis    Sleep apnea    Past Surgical History:  Procedure Laterality Date   ABDOMINAL HYSTERECTOMY  feb 2013   CARPAL TUNNEL RELEASE Left 03/20/2021   Procedure: CARPAL TUNNEL RELEASE ENDOSCOPIC;  Surgeon: Corky Mull, MD;  Location: [facility] ORS;  Service: Orthopedics;  Laterality: Left;   COLONOSCOPY WITH PROPOFOL N/A 08/02/2015   Procedure: COLONOSCOPY WITH PROPOFOL;  Surgeon: Manya Silvas, MD;  Location: [facility] ENDOSCOPY;  Service: Endoscopy;  Laterality: N/A;   ESOPHAGOGASTRODUODENOSCOPY (EGD) WITH PROPOFOL N/A 08/02/2015   Procedure: ESOPHAGOGASTRODUODENOSCOPY (EGD) WITH PROPOFOL;  Surgeon: Manya Silvas, MD;  Location: [facility] ENDOSCOPY;  Service: Endoscopy;  Laterality: N/A;   Patient Active Problem List   Diagnosis Date Noted   Hepatic steatosis 05/21/2019   Other proteinuria 01/25/2019   Vitamin D deficiency 04/13/2018   Gastroesophageal reflux disease with esophagitis 01/22/2018   Irritable bowel syndrome with diarrhea 04/03/2016   Lymphedema 01/17/2016   Venous (peripheral) insufficiency 01/17/2016   Pain in limb 01/17/2016   Iron  deficiency anemia due to chronic blood loss 01/09/2016   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   History of endometrial cancer 08/01/2015   Endometrial adenocarcinoma ([place]) 07/13/2015    Class: History of   Inguinal lymphadenopathy 07/13/2015   Long term current use of insulin ([place]) 12/12/2013   Microalbuminuria 12/12/2013   Diabetes ([place]) 07/28/2013   BP (high blood pressure) 07/28/2013   HLD (hyperlipidemia) 07/28/2013   Adiposity 07/28/2013   Obstructive apnea 07/28/2013   Arthritis, degenerative 07/28/2013   Apnea, sleep 07/28/2013   Thyroid nodule 07/28/2013    PCP: Tracie Harrier, MD  REFERRING PROVIDER: Tracie Harrier, MD  REFERRING DIAG: OA of right knee  THERAPY DIAG:  Chronic pain of right knee  Rationale for Evaluation and Treatment: Rehabilitation  ONSET DATE: Fall of 2023  SUBJECTIVE:   SUBJECTIVE STATEMENT: Pt reports her R knee is feeling "pretty achy". Before session today she was standing on her feet a lot. Pt states pain is more concentrated on the anterior aspect R knee. HEP is continuing to go well and she feels much more confident in performing the therex correctly. Pt mentions she feels the frequency and severity of the RLE pain is decreasing. Pt reports that "PT is definitely helping". NPS: 8.5/10 currently.   PERTINENT HISTORY: Pt is a 62 y.o. female who presents with chronic R knee pain that started in the Fall of 2023. Has done multiple bouts of PT with favorable results, then pain returns,  per pt "because I don't keep up with my exercises". Pt reports pain during walking and climbing stairs. Pt cannot recall a particular incidence that brought on her R knee pain this episode. Pt reports it's intermittently felt like it's going to "give out". Pt states the pain has worsened since onset. Pt reports it's hard to WB and move around on the R knee. She recalls one instance where she had pain in her R knee at night and woke up the next morning with swelling in her  calf and ankle. No nocturnal awakening. Morning stiffness alleviates after about 30 mins of movement. Mechanical sx present especially with walking, stairs, and getting in and out of the car. ON/OFF: R knee pain comes on slowly and it takes a while for it to subside. Pt reports in a typical day she experiences pain when she's on it for a long time. Pt mentions rest, ice, heat, and Tylenol provide temporary relief for her pain. No falls in the past 6 months. Pt ambulates using SPC with the exception of short distances inside her apartment. Pt is on disability and enjoys reading the Bible, talking on the phone, watching movies, and singing with her sister, Christine Lawson. PMH: diabetes (takes a shot to manage sx) and hx of CA (10 years in remission). Pt denies N/V, B&B changes, unexplained weight fluctuation, saddle paresthesia, fever, night sweats, or unrelenting night pain at this time.    PAIN:  Are you having pain? Yes: NPRS scale: C - 7.5/10; W - 9.5/10 (up on it and walking); B - 5/10 Pain location: Wraps around anterior and posterior aspects of R knee Pain description: Achy Aggravating factors: Walking, stairs, and getting in and out of the car Relieving factors: Propping it up; ice and heat; Tylenol  PRECAUTIONS: None  WEIGHT BEARING RESTRICTIONS: No  FALLS:  Has patient fallen in last 6 months? No  LIVING ENVIRONMENT: Lives with:  Sister Lives in: House/apartment Stairs: Handicap accessible apartment complex Has following equipment at home: Single point cane   OCCUPATION: Unemployed (on disability)  PLOF: Independent  PATIENT GOALS: Get back to ADLs pain-free.  NEXT MD VISIT: N/A  OBJECTIVE:   DIAGNOSTIC FINDINGS: N/A  PATIENT SURVEYS:  FOTO 61  COGNITION: Overall cognitive status: Within functional limits for tasks assessed     SENSATION: WFL  EDEMA:  N/A  MUSCLE LENGTH: Not assessed  POSTURE: rounded shoulders, forward head, increased lumbar lordosis, and flexed  trunk   PALPATION: No joint line tenderness present  LOWER EXTREMITY ROM:  Active ROM Right eval Left eval  Hip flexion    Hip extension Unable to formally test- pt unwilling to lay prone- but likely at least 25% limited, visualized in gait and standing  Hip abduction    Hip adduction    Hip internal rotation    Hip external rotation    Knee flexion 52 63  Knee extension 7 7  Ankle dorsiflexion    Ankle plantarflexion    Ankle inversion    Ankle eversion     (Blank rows = not tested)  LOWER EXTREMITY MMT:  MMT Right eval Left eval  Hip flexion 4 4  Hip extension    Hip abduction    Hip adduction    Hip internal rotation    Hip external rotation    Knee flexion    Knee extension 3+ 4  Ankle dorsiflexion 4 4  Ankle plantarflexion 5 5  Ankle inversion 5 5  Ankle eversion 5 5   (  Blank rows = not tested)  PAMs: - Tibiofemoral lateral glide: painful (concordant); hypomobile - Tibiofemoral medial glide: painful but felt a little better with repeated motion; hypomobile - Tibiofemoral distraction: pt experienced relief of sx - Patellar mobs:  - Superior and inferior glides: painful (concordant) - Lateral and medial glides: painful but felt better with repeated motion; hypomobile  LOWER EXTREMITY SPECIAL TESTS:  Knee special tests: Anterior drawer test: negative, Posterior drawer test: negative, Lachman Test: negative, and Apley's test: negative Thessalys Negative  FUNCTIONAL TESTS:  10 meter walk test: self-selected - 0.63 m/s; fastest - 0.83 m/s  GAIT: Distance walked: 10 meters Assistive device utilized: Single point cane Level of assistance: Complete Independence Comments: Decreased step and stride length, ataxic gait - shuffled feet; minimal knee flexion   TODAY'S TREATMENT:                                                                                                                              DATE: 04/24/22 Therex: - Nustep seat 6 UE 12 L3 5 mins for  gentle RLE mobility and strengthening; SPM 80-85 throughout - LAQ with 5 sec hold x 10 reps - Standing marches at stairs with RUE support x 12 (bilaterally) - OMEGA Leg Press 25# 2x x 12 with cuing for eccentric control with good carry over - Standing mini squats x 12 with carry over - STS without UE support 2x 10 with VC and TC to maintain trunk upright - RLE lateral step ups x 6, ceased due to improper form and fatigue  Last session: 04/15/22 - Nustep seat 6 UE 12 L3 5 mins for gentle RLE mobility and strengthening; SPM 80-85 throughout - Heel Slides x 12-20 reps (bilaterally) - LAQ with 5 sec hold x 10 reps - Standing marches at stairs with RUE support x 12 (bilaterally) - OMEGA Leg Press 20# x 12 with cuing for eccentric control with good carry over - STS without UE support 2x 10 with VC and TC to maintain trunk upright - HS stretch 1 x 30 secs (bilaterally)  Last session: 04/08/22  Heel Slides x 12-20 reps LAQ with 5 sec hold x 10 reps HS stretch 3x x 30 sec hold (bilaterally)  PATIENT EDUCATION:  Education details: Pt was educated on anatomical structures of the knee and why different positioning initiates symptoms. Pt also educated on hamstring stretch and adductor stretch which were given as HEP. Pt instructed and agreed to PT POC and treatment plan. Person educated: Patient Education method: Explanation, Demonstration, Tactile cues, Verbal cues, and Handouts Education comprehension: verbalized understanding, returned demonstration, and verbal cues required  HOME EXERCISE PROGRAM: Heel Slides x 12-20 reps LAQ with 5 sec hold x 10 reps HS stretch 3x x 30 sec hold (bilaterally)  ASSESSMENT:  CLINICAL IMPRESSION: PT continued therex progression for increased RLE mobility, strength, and normalized gait with success. Pt is able to demonstrate decent carry over with all demonstrations, VC, and TC given  for therex. Pt is able to execute therex with proper form and technique and put  forth great effort throughout session. No increase in pain through the entirety of the session. Pt unable to complete RLE lateral step ups wih proper form and technique; PT regressed accordingly. NPS: 7/10 at the end of the session. PT will continue progression as able. Pt would benefit from skilled PT to address aforementioned deficits and promote optimal return to PLOF and improve QoL.  OBJECTIVE IMPAIRMENTS: Abnormal gait, decreased activity tolerance, decreased balance, decreased endurance, decreased mobility, difficulty walking, decreased ROM, decreased strength, obesity, and pain.   ACTIVITY LIMITATIONS: carrying, lifting, standing, squatting, dressing, and locomotion level  PARTICIPATION LIMITATIONS: cleaning, community activity, and ADLs that require pt to WB on LLE  PERSONAL FACTORS: Fitness are also affecting patient's functional outcome.   REHAB POTENTIAL: Fair pt has taken ownership of not keeping up with her HEP after discharge so has a history of coming back   CLINICAL DECISION MAKING: Stable/uncomplicated  EVALUATION COMPLEXITY: Low   GOALS: Goals reviewed with patient? Yes  SHORT TERM GOALS: Target date: 05/06/22 Pt will execute HEP independently with proper form and technique in order to return to PLOF at home. Baseline: HEP given on 04/08/22 Goal status: INITIAL   LONG TERM GOALS: Target date: 06/03/22  Pt will increase FOTO score to 66 to demonstrate predicted increase in functional mobility to complete ADLs. Baseline: 52 Goal status: INITIAL  2.  Pt will demonstrate 10MWT speed of at least 1.0 m/s to demonstrate decreased fall risk and increased independence in community ambulation Baseline: self-selected - 0.63 m/s; fastest - 0.83 m/s Goal status: INITIAL  3.  Pt will demonstrate L knee mobility of 0-110d in order to be able to demonstrate normalized gait mechanics needed for household and community ambulation Baseline: 7-52d Goal status: INITIAL  4.  Pt will  decrease worst pain as reported on NPS by at least 3 points in order to demonstrate clinically significant reduction in pain. Baseline: NPS - 9.5/10 Goal status: INITIAL       PLAN:  PT FREQUENCY: 2x/week  PT DURATION: 8 weeks  PLANNED INTERVENTIONS: Therapeutic exercises, Therapeutic activity, Neuromuscular re-education, Balance training, Gait training, Patient/Family education, and Stair training  PLAN FOR NEXT SESSION: Update HEP.  5xSTS test  Durwin Reges DPT Durwin Reges, PT 04/25/2022, 9:54 AM

## 2022-04-29 ENCOUNTER — Encounter: Payer: Self-pay | Admitting: Physical Therapy

## 2022-04-29 ENCOUNTER — Ambulatory Visit: Payer: 59 | Admitting: Physical Therapy

## 2022-04-29 DIAGNOSIS — M25561 Pain in right knee: Secondary | ICD-10-CM | POA: Diagnosis not present

## 2022-04-29 DIAGNOSIS — G8929 Other chronic pain: Secondary | ICD-10-CM

## 2022-04-29 NOTE — Therapy (Signed)
OUTPATIENT PHYSICAL THERAPY LOWER EXTREMITY TREATMENT   Patient Name: Christine Lawson MRN: [id number] DOB:April 13, 1960, 62 y.o., female Today's Date: 04/29/2022  END OF SESSION:  PT End of Session - 04/29/22 1341     Visit Number 4    Number of Visits 17    Date for PT Re-Evaluation 05/09/22    PT Start Time 1345    PT Stop Time 1430    PT Time Calculation (min) 45 min    Activity Tolerance Patient tolerated treatment well    Behavior During Therapy [facility] for tasks assessed/performed                Past Medical History:  Diagnosis Date   Diabetes mellitus without complication ([place])    Endometrial adenocarcinoma ([place])    GERD (gastroesophageal reflux disease)    Hematochezia    Hyperlipidemia    Hypertension    IDA (iron deficiency anemia)    Inguinal lymphadenopathy 07/13/2015   Irritable bowel syndrome    Obesity    Osteoarthritis    Sleep apnea    Past Surgical History:  Procedure Laterality Date   ABDOMINAL HYSTERECTOMY  feb 2013   CARPAL TUNNEL RELEASE Left 03/20/2021   Procedure: CARPAL TUNNEL RELEASE ENDOSCOPIC;  Surgeon: Corky Mull, MD;  Location: [facility] ORS;  Service: Orthopedics;  Laterality: Left;   COLONOSCOPY WITH PROPOFOL N/A 08/02/2015   Procedure: COLONOSCOPY WITH PROPOFOL;  Surgeon: Manya Silvas, MD;  Location: [facility] ENDOSCOPY;  Service: Endoscopy;  Laterality: N/A;   ESOPHAGOGASTRODUODENOSCOPY (EGD) WITH PROPOFOL N/A 08/02/2015   Procedure: ESOPHAGOGASTRODUODENOSCOPY (EGD) WITH PROPOFOL;  Surgeon: Manya Silvas, MD;  Location: [facility] ENDOSCOPY;  Service: Endoscopy;  Laterality: N/A;   Patient Active Problem List   Diagnosis Date Noted   Hepatic steatosis 05/21/2019   Other proteinuria 01/25/2019   Vitamin D deficiency 04/13/2018   Gastroesophageal reflux disease with esophagitis 01/22/2018   Irritable bowel syndrome with diarrhea 04/03/2016   Lymphedema 01/17/2016   Venous (peripheral) insufficiency 01/17/2016   Pain in limb 01/17/2016    Iron deficiency anemia due to chronic blood loss 01/09/2016   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   History of endometrial cancer 08/01/2015   Endometrial adenocarcinoma ([place]) 07/13/2015    Class: History of   Inguinal lymphadenopathy 07/13/2015   Long term current use of insulin ([place]) 12/12/2013   Microalbuminuria 12/12/2013   Diabetes ([place]) 07/28/2013   BP (high blood pressure) 07/28/2013   HLD (hyperlipidemia) 07/28/2013   Adiposity 07/28/2013   Obstructive apnea 07/28/2013   Arthritis, degenerative 07/28/2013   Apnea, sleep 07/28/2013   Thyroid nodule 07/28/2013    PCP: Tracie Harrier, MD  REFERRING PROVIDER: Tracie Harrier, MD  REFERRING DIAG: OA of right knee  THERAPY DIAG:  Chronic pain of right knee  Rationale for Evaluation and Treatment: Rehabilitation  ONSET DATE: Fall of 2023  SUBJECTIVE:   SUBJECTIVE STATEMENT: Pt reports her R knee is feeling "so-so". Pt describes her pain as achy. Before treatment session today she stood on her feet a lot. Pt states pain is still more concentrated on the anterior aspect R knee. HEP is continuing to go well and she feels they are helping. NPS: 7.5/10 currently.   PERTINENT HISTORY: Pt is a 62 y.o. female who presents with chronic R knee pain that started in the Fall of 2023. Has done multiple bouts of PT with favorable results, then pain returns, per pt "because I don't keep up with my exercises". Pt reports pain during walking and climbing stairs. Pt  cannot recall a particular incidence that brought on her R knee pain this episode. Pt reports it's intermittently felt like it's going to "give out". Pt states the pain has worsened since onset. Pt reports it's hard to WB and move around on the R knee. She recalls one instance where she had pain in her R knee at night and woke up the next morning with swelling in her calf and ankle. No nocturnal awakening. Morning stiffness alleviates after about 30 mins of movement. Mechanical sx  present especially with walking, stairs, and getting in and out of the car. ON/OFF: R knee pain comes on slowly and it takes a while for it to subside. Pt reports in a typical day she experiences pain when she's on it for a long time. Pt mentions rest, ice, heat, and Tylenol provide temporary relief for her pain. No falls in the past 6 months. Pt ambulates using SPC with the exception of short distances inside her apartment. Pt is on disability and enjoys reading the Bible, talking on the phone, watching movies, and singing with her sister, [name]. PMH: diabetes (takes a shot to manage sx) and hx of CA (10 years in remission). Pt denies N/V, B&B changes, unexplained weight fluctuation, saddle paresthesia, fever, night sweats, or unrelenting night pain at this time.    PAIN:  Are you having pain? Yes: NPRS scale: C - 7.5/10; W - 9.5/10 (up on it and walking); B - 5/10 Pain location: Wraps around anterior and posterior aspects of R knee Pain description: Achy Aggravating factors: Walking, stairs, and getting in and out of the car Relieving factors: Propping it up; ice and heat; Tylenol  PRECAUTIONS: None  WEIGHT BEARING RESTRICTIONS: No  FALLS:  Has patient fallen in last 6 months? No  LIVING ENVIRONMENT: Lives with:  Sister Lives in: House/apartment Stairs: Handicap accessible apartment complex Has following equipment at home: Single point cane   OCCUPATION: Unemployed (on disability)  PLOF: Independent  PATIENT GOALS: Get back to ADLs pain-free.  NEXT MD VISIT: N/A  OBJECTIVE:   DIAGNOSTIC FINDINGS: N/A  PATIENT SURVEYS:  FOTO 76  COGNITION: Overall cognitive status: Within functional limits for tasks assessed     SENSATION: WFL  EDEMA:  N/A  MUSCLE LENGTH: Not assessed  POSTURE: rounded shoulders, forward head, increased lumbar lordosis, and flexed trunk   PALPATION: No joint line tenderness present  LOWER EXTREMITY ROM:  Active ROM Right eval Left eval   Hip flexion    Hip extension Unable to formally test- pt unwilling to lay prone- but likely at least 25% limited, visualized in gait and standing  Hip abduction    Hip adduction    Hip internal rotation    Hip external rotation    Knee flexion 52 63  Knee extension 7 7  Ankle dorsiflexion    Ankle plantarflexion    Ankle inversion    Ankle eversion     (Blank rows = not tested)  LOWER EXTREMITY MMT:  MMT Right eval Left eval  Hip flexion 4 4  Hip extension    Hip abduction    Hip adduction    Hip internal rotation    Hip external rotation    Knee flexion    Knee extension 3+ 4  Ankle dorsiflexion 4 4  Ankle plantarflexion 5 5  Ankle inversion 5 5  Ankle eversion 5 5   (Blank rows = not tested)  PAMs: - Tibiofemoral lateral glide: painful (concordant); hypomobile - Tibiofemoral medial glide: painful  but felt a little better with repeated motion; hypomobile - Tibiofemoral distraction: pt experienced relief of sx - Patellar mobs:  - Superior and inferior glides: painful (concordant) - Lateral and medial glides: painful but felt better with repeated motion; hypomobile  LOWER EXTREMITY SPECIAL TESTS:  Knee special tests: Anterior drawer test: negative, Posterior drawer test: negative, Lachman Test: negative, and Apley's test: negative Thessalys Negative  FUNCTIONAL TESTS:  10 meter walk test: self-selected - 0.63 m/s; fastest - 0.83 m/s  GAIT: Distance walked: 10 meters Assistive device utilized: Single point cane Level of assistance: Complete Independence Comments: Decreased step and stride length, ataxic gait - shuffled feet; minimal knee flexion   TODAY'S TREATMENT:                                                                                                                              DATE: 04/29/22 Therex: - Nustep seat 6 UE 12 L3 5 mins for gentle RLE mobility and strengthening; SPM 80-85 throughout - OMEGA Leg Press 30# 2x x 12 with cuing for eccentric  control with good carry over - STS without UE support 2x 10 with VC and TC to maintain upright trunk - Standing mini squats 2x x 12 cuing needed for toeing out with good carry over - LLE step up's/down's with mod assist RUE full support on handrial 2x x 10  Last session: 04/24/22 Therex: - Nustep seat 6 UE 12 L3 5 mins for gentle RLE mobility and strengthening; SPM 80-85 throughout - LAQ with 5 sec hold x 10 reps - Standing marches at stairs with RUE support x 12 (bilaterally) - OMEGA Leg Press 25# 2x x 12 with cuing for eccentric control with good carry over - Standing mini squats x 12 with carry over - STS without UE support 2x 10 with VC and TC to maintain trunk upright - RLE lateral step ups x 6, ceased due to improper form and fatigue  Last session: 04/15/22 - Nustep seat 6 UE 12 L3 5 mins for gentle RLE mobility and strengthening; SPM 80-85 throughout - Heel Slides x 12-20 reps (bilaterally) - LAQ with 5 sec hold x 10 reps - Standing marches at stairs with RUE support x 12 (bilaterally) - OMEGA Leg Press 20# x 12 with cuing for eccentric control with good carry over - STS without UE support 2x 10 with VC and TC to maintain trunk upright - HS stretch 1 x 30 secs (bilaterally)  Last session: 04/08/22  Heel Slides x 12-20 reps LAQ with 5 sec hold x 10 reps HS stretch 3x x 30 sec hold (bilaterally)  PATIENT EDUCATION:  Education details: Pt was educated on anatomical structures of the knee and why different positioning initiates symptoms. Pt also educated on hamstring stretch and adductor stretch which were given as HEP. Pt instructed and agreed to PT POC and treatment plan. Person educated: Patient Education method: Explanation, Demonstration, Tactile cues, Verbal cues, and Handouts Education comprehension:  verbalized understanding, returned demonstration, and verbal cues required  HOME EXERCISE PROGRAM: Heel Slides x 12-20 reps LAQ with 5 sec hold x 10 reps HS stretch 3x x 30  sec hold (bilaterally)  ASSESSMENT:  CLINICAL IMPRESSION: PT continued therex progression for increased RLE mobility, strength, and normalized gait with success. Pt is able to demonstrate decent carry over with all demonstrations, VC, and TC given for therex. Pt is able to execute therex with proper form and technique and put forth excellent effort throughout session. No increase in pain through the entirety of the session. Pt unable to complete RLE lateral step ups wih proper form and technique; PT regressed accordingly. NPS: 6.5/10 at the end of the session. PT will continue progression as able. Pt would benefit from skilled PT to address aforementioned deficits and promote optimal return to PLOF and improve QoL.  OBJECTIVE IMPAIRMENTS: Abnormal gait, decreased activity tolerance, decreased balance, decreased endurance, decreased mobility, difficulty walking, decreased ROM, decreased strength, obesity, and pain.   ACTIVITY LIMITATIONS: carrying, lifting, standing, squatting, dressing, and locomotion level  PARTICIPATION LIMITATIONS: cleaning, community activity, and ADLs that require pt to WB on LLE  PERSONAL FACTORS: Fitness are also affecting patient's functional outcome.   REHAB POTENTIAL: Fair pt has taken ownership of not keeping up with her HEP after discharge so has a history of coming back   CLINICAL DECISION MAKING: Stable/uncomplicated  EVALUATION COMPLEXITY: Low   GOALS: Goals reviewed with patient? Yes  SHORT TERM GOALS: Target date: 05/06/22 Pt will execute HEP independently with proper form and technique in order to return to PLOF at home. Baseline: HEP given on 04/08/22 Goal status: INITIAL   LONG TERM GOALS: Target date: 06/03/22  Pt will increase FOTO score to 66 to demonstrate predicted increase in functional mobility to complete ADLs. Baseline: 52 Goal status: INITIAL  2.  Pt will demonstrate 10MWT speed of at least 1.0 m/s to demonstrate decreased fall risk and  increased independence in community ambulation Baseline: self-selected - 0.63 m/s; fastest - 0.83 m/s Goal status: INITIAL  3.  Pt will demonstrate L knee mobility of 0-110d in order to be able to demonstrate normalized gait mechanics needed for household and community ambulation Baseline: 7-52d Goal status: INITIAL  4.  Pt will decrease worst pain as reported on NPS by at least 3 points in order to demonstrate clinically significant reduction in pain. Baseline: NPS - 9.5/10 Goal status: INITIAL       PLAN:  PT FREQUENCY: 2x/week  PT DURATION: 8 weeks  PLANNED INTERVENTIONS: Therapeutic exercises, Therapeutic activity, Neuromuscular re-education, Balance training, Gait training, Patient/Family education, and Stair training  PLAN FOR NEXT SESSION: Update HEP.  5xSTS test  Durwin Reges DPT [employer] [place], Student-PT 04/29/2022, 1:42 PM

## 2022-05-01 ENCOUNTER — Encounter: Payer: 59 | Admitting: Physical Therapy

## 2022-05-14 ENCOUNTER — Encounter: Payer: Self-pay | Admitting: Physical Therapy

## 2022-05-14 ENCOUNTER — Ambulatory Visit: Payer: 59 | Attending: Orthopedic Surgery | Admitting: Physical Therapy

## 2022-05-14 DIAGNOSIS — M25561 Pain in right knee: Secondary | ICD-10-CM | POA: Insufficient documentation

## 2022-05-14 DIAGNOSIS — M25512 Pain in left shoulder: Secondary | ICD-10-CM | POA: Diagnosis present

## 2022-05-14 DIAGNOSIS — G8929 Other chronic pain: Secondary | ICD-10-CM | POA: Insufficient documentation

## 2022-05-14 NOTE — Therapy (Signed)
OUTPATIENT PHYSICAL THERAPY LOWER EXTREMITY TREATMENT   Patient Name: Christine Lawson MRN: [id number] DOB:October 15, 1960, 62 y.o., female Today's Date: 05/14/2022  END OF SESSION:  PT End of Session - 05/14/22 1521     Visit Number 5    Number of Visits 17    Date for PT Re-Evaluation 06/22/22    Authorization Type [employer]    Authorization - Visit Number 5    Authorization - Number of Visits 10    Progress Note Due on Visit 10    PT Start Time [id number]    PT Stop Time 1555    PT Time Calculation (min) 38 min    Activity Tolerance Patient tolerated treatment well    Behavior During Therapy WFL for tasks assessed/performed                Past Medical History:  Diagnosis Date   Diabetes mellitus without complication ([place])    Endometrial adenocarcinoma ([place])    GERD (gastroesophageal reflux disease)    Hematochezia    Hyperlipidemia    Hypertension    IDA (iron deficiency anemia)    Inguinal lymphadenopathy 07/13/2015   Irritable bowel syndrome    Obesity    Osteoarthritis    Sleep apnea    Past Surgical History:  Procedure Laterality Date   ABDOMINAL HYSTERECTOMY  feb 2013   CARPAL TUNNEL RELEASE Left 03/20/2021   Procedure: CARPAL TUNNEL RELEASE ENDOSCOPIC;  Surgeon: Corky Mull, MD;  Location: [facility] ORS;  Service: Orthopedics;  Laterality: Left;   COLONOSCOPY WITH PROPOFOL N/A 08/02/2015   Procedure: COLONOSCOPY WITH PROPOFOL;  Surgeon: Manya Silvas, MD;  Location: [facility] ENDOSCOPY;  Service: Endoscopy;  Laterality: N/A;   ESOPHAGOGASTRODUODENOSCOPY (EGD) WITH PROPOFOL N/A 08/02/2015   Procedure: ESOPHAGOGASTRODUODENOSCOPY (EGD) WITH PROPOFOL;  Surgeon: Manya Silvas, MD;  Location: [facility] ENDOSCOPY;  Service: Endoscopy;  Laterality: N/A;   Patient Active Problem List   Diagnosis Date Noted   Hepatic steatosis 05/21/2019   Other proteinuria 01/25/2019   Vitamin D deficiency 04/13/2018   Gastroesophageal reflux disease with esophagitis 01/22/2018   Irritable  bowel syndrome with diarrhea 04/03/2016   Lymphedema 01/17/2016   Venous (peripheral) insufficiency 01/17/2016   Pain in limb 01/17/2016   Iron deficiency anemia due to chronic blood loss 01/09/2016   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   History of endometrial cancer 08/01/2015   Endometrial adenocarcinoma ([place]) 07/13/2015    Class: History of   Inguinal lymphadenopathy 07/13/2015   Long term current use of insulin ([place]) 12/12/2013   Microalbuminuria 12/12/2013   Diabetes ([place]) 07/28/2013   BP (high blood pressure) 07/28/2013   HLD (hyperlipidemia) 07/28/2013   Adiposity 07/28/2013   Obstructive apnea 07/28/2013   Arthritis, degenerative 07/28/2013   Apnea, sleep 07/28/2013   Thyroid nodule 07/28/2013    PCP: Tracie Harrier, MD  REFERRING PROVIDER: Tracie Harrier, MD  REFERRING DIAG: OA of right knee  THERAPY DIAG:  No diagnosis found.  Rationale for Evaluation and Treatment: Rehabilitation  ONSET DATE: Fall of 2023  SUBJECTIVE:   SUBJECTIVE STATEMENT: Pt reports she is doing well overall, current 7/10 pain. Has been doing her HEP and trying to walk.   PERTINENT HISTORY: Pt is a 62 y.o. female who presents with chronic R knee pain that started in the Fall of 2023. Has done multiple bouts of PT with favorable results, then pain returns, per pt "because I don't keep up with my exercises". Pt reports pain during walking and climbing stairs. Pt cannot recall a  particular incidence that brought on her R knee pain this episode. Pt reports it's intermittently felt like it's going to "give out". Pt states the pain has worsened since onset. Pt reports it's hard to WB and move around on the R knee. She recalls one instance where she had pain in her R knee at night and woke up the next morning with swelling in her calf and ankle. No nocturnal awakening. Morning stiffness alleviates after about 30 mins of movement. Mechanical sx present especially with walking, stairs, and getting in and  out of the car. ON/OFF: R knee pain comes on slowly and it takes a while for it to subside. Pt reports in a typical day she experiences pain when she's on it for a long time. Pt mentions rest, ice, heat, and Tylenol provide temporary relief for her pain. No falls in the past 6 months. Pt ambulates using SPC with the exception of short distances inside her apartment. Pt is on disability and enjoys reading the Bible, talking on the phone, watching movies, and singing with her sister, [name]. PMH: diabetes (takes a shot to manage sx) and hx of CA (10 years in remission). Pt denies N/V, B&B changes, unexplained weight fluctuation, saddle paresthesia, fever, night sweats, or unrelenting night pain at this time.    PAIN:  Are you having pain? Yes: NPRS scale: C - 7.5/10; W - 9.5/10 (up on it and walking); B - 5/10 Pain location: Wraps around anterior and posterior aspects of R knee Pain description: Achy Aggravating factors: Walking, stairs, and getting in and out of the car Relieving factors: Propping it up; ice and heat; Tylenol  PRECAUTIONS: None  WEIGHT BEARING RESTRICTIONS: No  FALLS:  Has patient fallen in last 6 months? No  LIVING ENVIRONMENT: Lives with:  Sister Lives in: House/apartment Stairs: Handicap accessible apartment complex Has following equipment at home: Single point cane   OCCUPATION: Unemployed (on disability)  PLOF: Independent  PATIENT GOALS: Get back to ADLs pain-free.  NEXT MD VISIT: N/A  OBJECTIVE:   DIAGNOSTIC FINDINGS: N/A  PATIENT SURVEYS:  FOTO 70  COGNITION: Overall cognitive status: Within functional limits for tasks assessed     SENSATION: WFL  EDEMA:  N/A  MUSCLE LENGTH: Not assessed  POSTURE: rounded shoulders, forward head, increased lumbar lordosis, and flexed trunk   PALPATION: No joint line tenderness present  LOWER EXTREMITY ROM:  Active ROM Right eval Left eval  Hip flexion    Hip extension Unable to formally test- pt  unwilling to lay prone- but likely at least 25% limited, visualized in gait and standing  Hip abduction    Hip adduction    Hip internal rotation    Hip external rotation    Knee flexion 52 63  Knee extension 7 7  Ankle dorsiflexion    Ankle plantarflexion    Ankle inversion    Ankle eversion     (Blank rows = not tested)  LOWER EXTREMITY MMT:  MMT Right eval Left eval  Hip flexion 4 4  Hip extension    Hip abduction    Hip adduction    Hip internal rotation    Hip external rotation    Knee flexion    Knee extension 3+ 4  Ankle dorsiflexion 4 4  Ankle plantarflexion 5 5  Ankle inversion 5 5  Ankle eversion 5 5   (Blank rows = not tested)  PAMs: - Tibiofemoral lateral glide: painful (concordant); hypomobile - Tibiofemoral medial glide: painful but felt a  little better with repeated motion; hypomobile - Tibiofemoral distraction: pt experienced relief of sx - Patellar mobs:  - Superior and inferior glides: painful (concordant) - Lateral and medial glides: painful but felt better with repeated motion; hypomobile  LOWER EXTREMITY SPECIAL TESTS:  Knee special tests: Anterior drawer test: negative, Posterior drawer test: negative, Lachman Test: negative, and Apley's test: negative Thessalys Negative  FUNCTIONAL TESTS:  10 meter walk test: self-selected - 0.63 m/s; fastest - 0.83 m/s  GAIT: Distance walked: 10 meters Assistive device utilized: Single point cane Level of assistance: Complete Independence Comments: Decreased step and stride length, ataxic gait - shuffled feet; minimal knee flexion   TODAY'S TREATMENT:                                                                                                                              DATE: 04/29/22 Therex: - Nustep seat 6 UE 12 L3 5 mins for gentle RLE mobility and strengthening; SPM 80-85 throughout - OMEGA Leg Press 30# x12; 35# x10 with cuing for eccentric control with good carry over - TRX squat with narrow  BOS for increased knee flex with success 2x 12 - Ball slams 15# 2x 5/6 with good carry over of initial cuing for technique - Lateral up and over 8in step alt x10  with bilat UE support; from 6in step x12    PATIENT EDUCATION:  Education details: Pt was educated on anatomical structures of the knee and why different positioning initiates symptoms. Pt also educated on hamstring stretch and adductor stretch which were given as HEP. Pt instructed and agreed to PT POC and treatment plan. Person educated: Patient Education method: Explanation, Demonstration, Tactile cues, Verbal cues, and Handouts Education comprehension: verbalized understanding, returned demonstration, and verbal cues required  HOME EXERCISE PROGRAM: Heel Slides x 12-20 reps LAQ with 5 sec hold x 10 reps HS stretch 3x x 30 sec hold (bilaterally)  ASSESSMENT:  CLINICAL IMPRESSION: PT continued therex progression for increased RLE mobility, strength, and normalized gait with success. Pt is motivated throughout session without increased pain. She is able to comply with all cuing for proper technique of therex. PT will continue progression as able. Pt would benefit from skilled PT to address aforementioned deficits and promote optimal return to PLOF and improve QoL.  OBJECTIVE IMPAIRMENTS: Abnormal gait, decreased activity tolerance, decreased balance, decreased endurance, decreased mobility, difficulty walking, decreased ROM, decreased strength, obesity, and pain.   ACTIVITY LIMITATIONS: carrying, lifting, standing, squatting, dressing, and locomotion level  PARTICIPATION LIMITATIONS: cleaning, community activity, and ADLs that require pt to WB on LLE  PERSONAL FACTORS: Fitness are also affecting patient's functional outcome.   REHAB POTENTIAL: Fair pt has taken ownership of not keeping up with her HEP after discharge so has a history of coming back   CLINICAL DECISION MAKING: Stable/uncomplicated  EVALUATION COMPLEXITY:  Low   GOALS: Goals reviewed with patient? Yes  SHORT TERM GOALS: Target date: 05/06/22 Pt will execute  HEP independently with proper form and technique in order to return to PLOF at home. Baseline: HEP given on 04/08/22 Goal status: INITIAL   LONG TERM GOALS: Target date: 06/03/22  Pt will increase FOTO score to 66 to demonstrate predicted increase in functional mobility to complete ADLs. Baseline: 52 Goal status: INITIAL  2.  Pt will demonstrate 10MWT speed of at least 1.0 m/s to demonstrate decreased fall risk and increased independence in community ambulation Baseline: self-selected - 0.63 m/s; fastest - 0.83 m/s Goal status: INITIAL  3.  Pt will demonstrate L knee mobility of 0-110d in order to be able to demonstrate normalized gait mechanics needed for household and community ambulation Baseline: 7-52d Goal status: INITIAL  4.  Pt will decrease worst pain as reported on NPS by at least 3 points in order to demonstrate clinically significant reduction in pain. Baseline: NPS - 9.5/10 Goal status: INITIAL       PLAN:  PT FREQUENCY: 2x/week  PT DURATION: 8 weeks  PLANNED INTERVENTIONS: Therapeutic exercises, Therapeutic activity, Neuromuscular re-education, Balance training, Gait training, Patient/Family education, and Stair training  PLAN FOR NEXT SESSION: Update HEP.  5xSTS test  Durwin Reges DPT Durwin Reges, PT 05/14/2022, 3:56 PM

## 2022-05-22 ENCOUNTER — Ambulatory Visit: Payer: 59 | Admitting: Physical Therapy

## 2022-05-22 ENCOUNTER — Encounter: Payer: Self-pay | Admitting: Physical Therapy

## 2022-05-22 DIAGNOSIS — M25561 Pain in right knee: Secondary | ICD-10-CM | POA: Diagnosis not present

## 2022-05-22 DIAGNOSIS — G8929 Other chronic pain: Secondary | ICD-10-CM

## 2022-05-22 NOTE — Therapy (Signed)
OUTPATIENT PHYSICAL THERAPY LOWER EXTREMITY TREATMENT   Patient Name: Christine Lawson MRN: [id number] DOB:08-04-1960, 62 y.o., female Today's Date: 05/22/2022  END OF SESSION:  PT End of Session - 05/22/22 1504     Visit Number 6    Number of Visits 17    Date for PT Re-Evaluation 06/22/22    Authorization Type [employer]    Authorization - Visit Number 6    Authorization - Number of Visits 10    Progress Note Due on Visit 10    PT Start Time [id number]    PT Stop Time 1536    PT Time Calculation (min) 38 min    Activity Tolerance Patient tolerated treatment well    Behavior During Therapy WFL for tasks assessed/performed                 Past Medical History:  Diagnosis Date   Diabetes mellitus without complication ([place])    Endometrial adenocarcinoma ([place])    GERD (gastroesophageal reflux disease)    Hematochezia    Hyperlipidemia    Hypertension    IDA (iron deficiency anemia)    Inguinal lymphadenopathy 07/13/2015   Irritable bowel syndrome    Obesity    Osteoarthritis    Sleep apnea    Past Surgical History:  Procedure Laterality Date   ABDOMINAL HYSTERECTOMY  feb 2013   CARPAL TUNNEL RELEASE Left 03/20/2021   Procedure: CARPAL TUNNEL RELEASE ENDOSCOPIC;  Surgeon: Christine Mull, MD;  Location: [facility] ORS;  Service: Orthopedics;  Laterality: Left;   COLONOSCOPY WITH PROPOFOL N/A 08/02/2015   Procedure: COLONOSCOPY WITH PROPOFOL;  Surgeon: Christine Silvas, MD;  Location: [facility] ENDOSCOPY;  Service: Endoscopy;  Laterality: N/A;   ESOPHAGOGASTRODUODENOSCOPY (EGD) WITH PROPOFOL N/A 08/02/2015   Procedure: ESOPHAGOGASTRODUODENOSCOPY (EGD) WITH PROPOFOL;  Surgeon: Christine Silvas, MD;  Location: [facility] ENDOSCOPY;  Service: Endoscopy;  Laterality: N/A;   Patient Active Problem List   Diagnosis Date Noted   Hepatic steatosis 05/21/2019   Other proteinuria 01/25/2019   Vitamin D deficiency 04/13/2018   Gastroesophageal reflux disease with esophagitis 01/22/2018    Irritable bowel syndrome with diarrhea 04/03/2016   Lymphedema 01/17/2016   Venous (peripheral) insufficiency 01/17/2016   Pain in limb 01/17/2016   Iron deficiency anemia due to chronic blood loss 01/09/2016   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   History of endometrial cancer 08/01/2015   Endometrial adenocarcinoma ([place]) 07/13/2015    Class: History of   Inguinal lymphadenopathy 07/13/2015   Long term current use of insulin ([place]) 12/12/2013   Microalbuminuria 12/12/2013   Diabetes ([place]) 07/28/2013   BP (high blood pressure) 07/28/2013   HLD (hyperlipidemia) 07/28/2013   Adiposity 07/28/2013   Obstructive apnea 07/28/2013   Arthritis, degenerative 07/28/2013   Apnea, sleep 07/28/2013   Thyroid nodule 07/28/2013    PCP: Christine Harrier, MD  REFERRING PROVIDER: Tracie Harrier, MD  REFERRING DIAG: OA of right knee  THERAPY DIAG:  Chronic pain of right knee  Chronic left shoulder pain  Rationale for Evaluation and Treatment: Rehabilitation  ONSET DATE: Fall of 2023  SUBJECTIVE:   SUBJECTIVE STATEMENT: Pt reports she is doing well overall, current 7/10 pain. Has been doing her HEP and trying to walk.   PERTINENT HISTORY: Pt is a 62 y.o. female who presents with chronic R knee pain that started in the Fall of 2023. Has done multiple bouts of PT with favorable results, then pain returns, per pt "because I don't keep up with my exercises". Pt reports pain during  walking and climbing stairs. Pt cannot recall a particular incidence that brought on her R knee pain this episode. Pt reports it's intermittently felt like it's going to "give out". Pt states the pain has worsened since onset. Pt reports it's hard to WB and move around on the R knee. She recalls one instance where she had pain in her R knee at night and woke up the next morning with swelling in her calf and ankle. No nocturnal awakening. Morning stiffness alleviates after about 30 mins of movement. Mechanical sx present  especially with walking, stairs, and getting in and out of the car. ON/OFF: R knee pain comes on slowly and it takes a while for it to subside. Pt reports in a typical day she experiences pain when she's on it for a long time. Pt mentions rest, ice, heat, and Tylenol provide temporary relief for her pain. No falls in the past 6 months. Pt ambulates using SPC with the exception of short distances inside her apartment. Pt is on disability and enjoys reading the Bible, talking on the phone, watching movies, and singing with her sister, Christine Lawson. PMH: diabetes (takes a shot to manage sx) and hx of CA (10 years in remission). Pt denies N/V, B&B changes, unexplained weight fluctuation, saddle paresthesia, fever, night sweats, or unrelenting night pain at this time.    PAIN:  Are you having pain? Yes: NPRS scale: C - 7.5/10; W - 9.5/10 (up on it and walking); B - 5/10 Pain location: Wraps around anterior and posterior aspects of R knee Pain description: Achy Aggravating factors: Walking, stairs, and getting in and out of the car Relieving factors: Propping it up; ice and heat; Tylenol  PRECAUTIONS: None  WEIGHT BEARING RESTRICTIONS: No  FALLS:  Has patient fallen in last 6 months? No  LIVING ENVIRONMENT: Lives with:  Sister Lives in: House/apartment Stairs: Handicap accessible apartment complex Has following equipment at home: Single point cane   OCCUPATION: Unemployed (on disability)  PLOF: Independent  PATIENT GOALS: Get back to ADLs pain-free.  NEXT MD VISIT: N/A  OBJECTIVE:   DIAGNOSTIC FINDINGS: N/A  PATIENT SURVEYS:  FOTO 25  COGNITION: Overall cognitive status: Within functional limits for tasks assessed     SENSATION: WFL  EDEMA:  N/A  MUSCLE LENGTH: Not assessed  POSTURE: rounded shoulders, forward head, increased lumbar lordosis, and flexed trunk   PALPATION: No joint line tenderness present  LOWER EXTREMITY ROM:  Active ROM Right eval Left eval  Hip  flexion    Hip extension Unable to formally test- pt unwilling to lay prone- but likely at least 25% limited, visualized in gait and standing  Hip abduction    Hip adduction    Hip internal rotation    Hip external rotation    Knee flexion 52 63  Knee extension 7 7  Ankle dorsiflexion    Ankle plantarflexion    Ankle inversion    Ankle eversion     (Blank rows = not tested)  LOWER EXTREMITY MMT:  MMT Right eval Left eval  Hip flexion 4 4  Hip extension    Hip abduction    Hip adduction    Hip internal rotation    Hip external rotation    Knee flexion    Knee extension 3+ 4  Ankle dorsiflexion 4 4  Ankle plantarflexion 5 5  Ankle inversion 5 5  Ankle eversion 5 5   (Blank rows = not tested)  PAMs: - Tibiofemoral lateral glide: painful (concordant); hypomobile -  Tibiofemoral medial glide: painful but felt a little better with repeated motion; hypomobile - Tibiofemoral distraction: pt experienced relief of sx - Patellar mobs:  - Superior and inferior glides: painful (concordant) - Lateral and medial glides: painful but felt better with repeated motion; hypomobile  LOWER EXTREMITY SPECIAL TESTS:  Knee special tests: Anterior drawer test: negative, Posterior drawer test: negative, Lachman Test: negative, and Apley's test: negative Thessalys Negative  FUNCTIONAL TESTS:  10 meter walk test: self-selected - 0.63 m/s; fastest - 0.83 m/s  GAIT: Distance walked: 10 meters Assistive device utilized: Single point cane Level of assistance: Complete Independence Comments: Decreased step and stride length, ataxic gait - shuffled feet; minimal knee flexion   TODAY'S TREATMENT:                                                                                                                              DATE: 04/29/22 Therex: - Nustep seat 6 UE 12 L3 5 mins for gentle RLE mobility and strengthening; SPM 80-85 throughout - OMEGA Leg Press  35# 2 x12 with cuing for eccentric control  with good carry over - STS to heel raise 2x 10 with good carry over of cuing for technique - Ball slams 15# 2x 5/6 with good carry over of initial cuing for technique - Lateral up and over 8in step alt x10  with bilat UE support; from 6in step x12    PATIENT EDUCATION:  Education details: Pt was educated on anatomical structures of the knee and why different positioning initiates symptoms. Pt also educated on hamstring stretch and adductor stretch which were given as HEP. Pt instructed and agreed to PT POC and treatment plan. Person educated: Patient Education method: Explanation, Demonstration, Tactile cues, Verbal cues, and Handouts Education comprehension: verbalized understanding, returned demonstration, and verbal cues required  HOME EXERCISE PROGRAM: Heel Slides x 12-20 reps LAQ with 5 sec hold x 10 reps HS stretch 3x x 30 sec hold (bilaterally)  ASSESSMENT:  CLINICAL IMPRESSION: PT continued therex progression for increased RLE mobility, strength, and normalized gait with success. Pt is motivated throughout session without increased pain. She is able to comply with all cuing for proper technique of therex. PT will continue progression as able. Pt would benefit from skilled PT to address aforementioned deficits and promote optimal return to PLOF and improve QoL.  OBJECTIVE IMPAIRMENTS: Abnormal gait, decreased activity tolerance, decreased balance, decreased endurance, decreased mobility, difficulty walking, decreased ROM, decreased strength, obesity, and pain.   ACTIVITY LIMITATIONS: carrying, lifting, standing, squatting, dressing, and locomotion level  PARTICIPATION LIMITATIONS: cleaning, community activity, and ADLs that require pt to WB on LLE  PERSONAL FACTORS: Fitness are also affecting patient's functional outcome.   REHAB POTENTIAL: Fair pt has taken ownership of not keeping up with her HEP after discharge so has a history of coming back   CLINICAL DECISION MAKING:  Stable/uncomplicated  EVALUATION COMPLEXITY: Low   GOALS: Goals reviewed with patient? Yes  SHORT  TERM GOALS: Target date: 05/06/22 Pt will execute HEP independently with proper form and technique in order to return to PLOF at home. Baseline: HEP given on 04/08/22 Goal status: INITIAL   LONG TERM GOALS: Target date: 06/03/22  Pt will increase FOTO score to 66 to demonstrate predicted increase in functional mobility to complete ADLs. Baseline: 52 Goal status: INITIAL  2.  Pt will demonstrate 10MWT speed of at least 1.0 m/s to demonstrate decreased fall risk and increased independence in community ambulation Baseline: self-selected - 0.63 m/s; fastest - 0.83 m/s Goal status: INITIAL  3.  Pt will demonstrate L knee mobility of 0-110d in order to be able to demonstrate normalized gait mechanics needed for household and community ambulation Baseline: 7-52d Goal status: INITIAL  4.  Pt will decrease worst pain as reported on NPS by at least 3 points in order to demonstrate clinically significant reduction in pain. Baseline: NPS - 9.5/10 Goal status: INITIAL       PLAN:  PT FREQUENCY: 2x/week  PT DURATION: 8 weeks  PLANNED INTERVENTIONS: Therapeutic exercises, Therapeutic activity, Neuromuscular re-education, Balance training, Gait training, Patient/Family education, and Stair training  PLAN FOR NEXT SESSION: Update HEP.  5xSTS test  Durwin Reges DPT Durwin Reges, PT 05/22/2022, 3:53 PM

## 2022-05-28 ENCOUNTER — Ambulatory Visit: Payer: 59 | Admitting: Physical Therapy

## 2022-06-05 ENCOUNTER — Ambulatory Visit: Payer: 59 | Admitting: Physical Therapy

## 2022-06-16 ENCOUNTER — Ambulatory Visit: Payer: 59 | Attending: Orthopedic Surgery

## 2022-06-16 DIAGNOSIS — G8929 Other chronic pain: Secondary | ICD-10-CM | POA: Insufficient documentation

## 2022-06-16 DIAGNOSIS — M25561 Pain in right knee: Secondary | ICD-10-CM | POA: Diagnosis present

## 2022-06-16 NOTE — Therapy (Signed)
OUTPATIENT PHYSICAL THERAPY LOWER EXTREMITY TREATMENT   Patient Name: Christine Lawson MRN: [id number] DOB:1960-06-02, 62 y.o., female Today's Date: 06/16/2022  END OF SESSION:  PT End of Session - 06/16/22 1345     Visit Number 7    Number of Visits 17    Date for PT Re-Evaluation 06/22/22    Authorization Type [employer] Medicare    Authorization - Number of Visits 10    Progress Note Due on Visit 10    PT Start Time 1445    PT Stop Time 1530    PT Time Calculation (min) 45 min    Activity Tolerance Patient tolerated treatment well    Behavior During Therapy WFL for tasks assessed/performed                 Past Medical History:  Diagnosis Date   Diabetes mellitus without complication ([facility])    Endometrial adenocarcinoma ([facility])    GERD (gastroesophageal reflux disease)    Hematochezia    Hyperlipidemia    Hypertension    IDA (iron deficiency anemia)    Inguinal lymphadenopathy 07/13/2015   Irritable bowel syndrome    Obesity    Osteoarthritis    Sleep apnea    Past Surgical History:  Procedure Laterality Date   ABDOMINAL HYSTERECTOMY  feb 2013   CARPAL TUNNEL RELEASE Left 03/20/2021   Procedure: CARPAL TUNNEL RELEASE ENDOSCOPIC;  Surgeon: Christena Flake, MD;  Location: [facility] ORS;  Service: Orthopedics;  Laterality: Left;   COLONOSCOPY WITH PROPOFOL N/A 08/02/2015   Procedure: COLONOSCOPY WITH PROPOFOL;  Surgeon: Scot Jun, MD;  Location: [facility] ENDOSCOPY;  Service: Endoscopy;  Laterality: N/A;   ESOPHAGOGASTRODUODENOSCOPY (EGD) WITH PROPOFOL N/A 08/02/2015   Procedure: ESOPHAGOGASTRODUODENOSCOPY (EGD) WITH PROPOFOL;  Surgeon: Scot Jun, MD;  Location[facility] END[facility]COPY;  Service: Endoscopy;  Laterality: N/A;   Patient Active Problem List   Diagnosis Date Noted   Hepatic steatosis 05/21/2019   Other proteinuria 01/25/2019   Vitamin D deficiency 04/13/2018   Gastroesophageal reflux disease with esophagitis 01/22/2018   Irritable bowel syndrome with diarrhea  04/03/2016   Lymphedema 01/17/2016   Venous (peripheral) insufficiency 01/17/2016   Pain in limb 01/17/2016   Iron deficiency anemia due to chronic blood loss 01/09/2016   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   History of endometrial cancer 08/01/2015   Endometrial adenocarcinoma 07/13/2015    Class: History of   Inguinal lymphadenopathy 07/13/2015   Long term current use of insulin 12/12/2013   Microalbuminuria 12/12/2013   Diabetes 07/28/2013   BP (high blood pressure) 07/28/2013   HLD (hyperlipidemia) 07/28/2013   Adiposity 07/28/2013   Obstructive apnea 07/28/2013   Arthritis, degenerative 07/28/2013   Apnea, sleep 07/28/2013   Thyroid nodule 07/28/2013    PCP: Barbette Reichmann, MD  REFERRING PROVIDER: Barbette Reichmann, MD  REFERRING DIAG: OA of right knee  THERAPY DIAG:  Chronic pain of right knee  Rationale for Evaluation and Treatment: Rehabilitation  ONSET DATE: Fall of 2023  SUBJECTIVE:   SUBJECTIVE STATEMENT:  Pt reports she is having 7/10 pain in the R knee at this point in time.  Pt states she feels as though she needs more therapy for a few more weeks.      PERTINENT HISTORY: Pt is a 62 y.o. female who presents with chronic R knee pain that started in the Fall of 2023. Has done multiple bouts of PT with favorable results, then pain returns, per pt "because I don't keep up with my exercises". Pt reports pain  during walking and climbing stairs. Pt cannot recall a particular incidence that brought on her R knee pain this episode. Pt reports it's intermittently felt like it's going to "give out". Pt states the pain has worsened since onset. Pt reports it's hard to WB and move around on the R knee. She recalls one instance where she had pain in her R knee at night and woke up the next morning with swelling in her calf and ankle. No nocturnal awakening. Morning stiffness alleviates after about 30 mins of movement. Mechanical sx present especially with walking, stairs, and  getting in and out of the car. ON/OFF: R knee pain comes on slowly and it takes a while for it to subside. Pt reports in a typical day she experiences pain when she's on it for a long time. Pt mentions rest, ice, heat, and Tylenol provide temporary relief for her pain. No falls in the past 6 months. Pt ambulates using SPC with the exception of short distances inside her apartment. Pt is on disability and enjoys reading the Bible, talking on the phone, watching movies, and singing with her sister, Christine Lawson. PMH: diabetes (takes a shot to manage sx) and hx of CA (10 years in remission). Pt denies N/V, B&B changes, unexplained weight fluctuation, saddle paresthesia, fever, night sweats, or unrelenting night pain at this time.    PAIN:  Are you having pain? Yes: NPRS scale: C - 7.5/10; W - 9.5/10 (up on it and walking); B - 5/10 Pain location: Wraps around anterior and posterior aspects of R knee Pain description: Achy Aggravating factors: Walking, stairs, and getting in and out of the car Relieving factors: Propping it up; ice and heat; Tylenol  PRECAUTIONS: None  WEIGHT BEARING RESTRICTIONS: No  FALLS:  Has patient fallen in last 6 months? No  LIVING ENVIRONMENT: Lives with:  Sister Lives in: House/apartment Stairs: Handicap accessible apartment complex Has following equipment at home: Single point cane   OCCUPATION: Unemployed (on disability)  PLOF: Independent  PATIENT GOALS: Get back to ADLs pain-free.  NEXT MD VISIT: N/A  OBJECTIVE:   DIAGNOSTIC FINDINGS: N/A  PATIENT SURVEYS:  FOTO 51  COGNITION: Overall cognitive status: Within functional limits for tasks assessed     SENSATION: WFL  EDEMA:  N/A  MUSCLE LENGTH: Not assessed  POSTURE: rounded shoulders, forward head, increased lumbar lordosis, and flexed trunk   PALPATION: No joint line tenderness present  LOWER EXTREMITY ROM:  Active ROM Right eval Left eval  Hip flexion    Hip extension Unable to  formally test- pt unwilling to lay prone- but likely at least 25% limited, visualized in gait and standing  Hip abduction    Hip adduction    Hip internal rotation    Hip external rotation    Knee flexion 52 63  Knee extension 7 7  Ankle dorsiflexion    Ankle plantarflexion    Ankle inversion    Ankle eversion     (Blank rows = not tested)  LOWER EXTREMITY MMT:  MMT Right eval Left eval  Hip flexion 4 4  Hip extension    Hip abduction    Hip adduction    Hip internal rotation    Hip external rotation    Knee flexion    Knee extension 3+ 4  Ankle dorsiflexion 4 4  Ankle plantarflexion 5 5  Ankle inversion 5 5  Ankle eversion 5 5   (Blank rows = not tested)  PAMs: - Tibiofemoral lateral glide: painful (concordant);  hypomobile - Tibiofemoral medial glide: painful but felt a little better with repeated motion; hypomobile - Tibiofemoral distraction: pt experienced relief of sx - Patellar mobs:  - Superior and inferior glides: painful (concordant) - Lateral and medial glides: painful but felt better with repeated motion; hypomobile  LOWER EXTREMITY SPECIAL TESTS:  Knee special tests: Anterior drawer test: negative, Posterior drawer test: negative, Lachman Test: negative, and Apley's test: negative Thessalys Negative  FUNCTIONAL TESTS:  10 meter walk test: self-selected - 0.63 m/s; fastest - 0.83 m/s  GAIT: Distance walked: 10 meters Assistive device utilized: Single point cane Level of assistance: Complete Independence Comments: Decreased step and stride length, ataxic gait - shuffled feet; minimal knee flexion   TODAY'S TREATMENT: DATE: 06/16/22   Therex: - Nustep seat 6 UE 12 L3 5 mins for gentle RLE mobility and strengthening; SPM 80-85 throughout - OMEGA Leg Press  47.5#, 2x12 with cuing for eccentric control with good carry over - STS to heel raise, 2x10 with good carry over of cuing for technique [employer], 15# 2x 5/6 with good carry over of initial  cuing for technique - Lateral up onto 6" step with use of UE's for balance and support at stairs, x10 each LE    PATIENT EDUCATION:  Education details: Pt was educated on anatomical structures of the knee and why different positioning initiates symptoms. Pt also educated on hamstring stretch and adductor stretch which were given as HEP. Pt instructed and agreed to PT POC and treatment plan. Person educated: Patient Education method: Explanation, Demonstration, Tactile cues, Verbal cues, and Handouts Education comprehension: verbalized understanding, returned demonstration, and verbal cues required  HOME EXERCISE PROGRAM: Heel Slides x 12-20 reps LAQ with 5 sec hold x 10 reps HS stretch 3x x 30 sec hold (bilaterally)  ASSESSMENT:  CLINICAL IMPRESSION:  Pt responded well to the exercises and put forth great effort throughout the session.  Pt is making good progress towards goals at this time, however would benefit from continued therapy and more visits.  Will re-assess when current certification ends and plan on adding a few weeks of continued therapy in order to get pt to be as close to pain free as possible, while also increasing strength.   Pt will continue to benefit from skilled therapy to address remaining deficits in order to improve overall QoL and return to PLOF.      OBJECTIVE IMPAIRMENTS: Abnormal gait, decreased activity tolerance, decreased balance, decreased endurance, decreased mobility, difficulty walking, decreased ROM, decreased strength, obesity, and pain.   ACTIVITY LIMITATIONS: carrying, lifting, standing, squatting, dressing, and locomotion level  PARTICIPATION LIMITATIONS: cleaning, community activity, and ADLs that require pt to WB on LLE  PERSONAL FACTORS: Fitness are also affecting patient's functional outcome.   REHAB POTENTIAL: Fair pt has taken ownership of not keeping up with her HEP after discharge so has a history of coming back   CLINICAL DECISION  MAKING: Stable/uncomplicated  EVALUATION COMPLEXITY: Low   GOALS: Goals reviewed with patient? Yes  SHORT TERM GOALS: Target date: 05/06/22 Pt will execute HEP independently with proper form and technique in order to return to PLOF at home. Baseline: HEP given on 04/08/22 Goal status: INITIAL   LONG TERM GOALS: Target date: 06/03/22  Pt will increase FOTO score to 66 to demonstrate predicted increase in functional mobility to complete ADLs. Baseline: 52 Goal status: INITIAL  2.  Pt will demonstrate speed of at least 1.0 m/s to demonstrate decreased fall risk and increased independence in  community ambulation Baseline: self-selected - 0.63 m/s; fastest - 0.83 m/s Goal status: INITIAL  3.  Pt will demonstrate L knee mobility of 0-110d in order to be able to demonstrate normalized gait mechanics needed for household and community ambulation Baseline: 7-52d Goal status: INITIAL  4.  Pt will decrease worst pain as reported on NPS by at least 3 points in order to demonstrate clinically significant reduction in pain. Baseline: NPS - 9.5/10 Goal status: INITIAL       PLAN:  PT FREQUENCY: 2x/week  PT DURATION: 8 weeks  PLANNED INTERVENTIONS: Therapeutic exercises, Therapeutic activity, Neuromuscular re-education, Balance training, Gait training, Patient/Family education, and Stair training  PLAN FOR NEXT SESSION:  Update HEP.  5xSTS test   Nolon Bussing, PT, DPT Physical Therapist - [facility]  06/16/22, 4:42 PM

## 2022-07-01 ENCOUNTER — Encounter: Payer: Self-pay | Admitting: Internal Medicine

## 2022-07-02 ENCOUNTER — Ambulatory Visit: Payer: 59

## 2022-07-07 ENCOUNTER — Ambulatory Visit: Payer: 59 | Attending: Orthopedic Surgery

## 2022-07-07 DIAGNOSIS — M25561 Pain in right knee: Secondary | ICD-10-CM | POA: Diagnosis present

## 2022-07-07 DIAGNOSIS — G8929 Other chronic pain: Secondary | ICD-10-CM | POA: Insufficient documentation

## 2022-07-07 NOTE — Therapy (Signed)
OUTPATIENT PHYSICAL THERAPY LOWER EXTREMITY TREATMENT   Patient Name: Christine Lawson MRN: [id number] DOB:03/02/1961, 62 y.o., female Today's Date: 07/07/2022  END OF SESSION:  PT End of Session - 07/07/22 1441     Visit Number 8    Number of Visits 17    Date for PT Re-Evaluation 06/22/22    Authorization Type [employer]    Authorization - Visit Number 8    Authorization - Number of Visits 10    Progress Note Due on Visit 10    PT Start Time 1434    PT Stop Time 1515    PT Time Calculation (min) 41 min    Activity Tolerance Patient tolerated treatment well    Behavior During Therapy WFL for tasks assessed/performed                 Past Medical History:  Diagnosis Date   Diabetes mellitus without complication (HCC)    Endometrial adenocarcinoma (HCC)    GERD (gastroesophageal reflux disease)    Hematochezia    Hyperlipidemia    Hypertension    IDA (iron deficiency anemia)    Inguinal lymphadenopathy 07/13/2015   Irritable bowel syndrome    Obesity    Osteoarthritis    Sleep apnea    Past Surgical History:  Procedure Laterality Date   ABDOMINAL HYSTERECTOMY  feb 2013   CARPAL TUNNEL RELEASE Left 03/20/2021   Procedure: CARPAL TUNNEL RELEASE ENDOSCOPIC;  Surgeon: Christena Flake, MD;  Location: [facility] ORS;  Service: Orthopedics;  Laterality: Left;   COLONOSCOPY WITH PROPOFOL N/A 08/02/2015   Procedure: COLONOSCOPY WITH PROPOFOL;  Surgeon: Scot Jun, MD;  Location: [facility] ENDOSCOPY;  Service: Endoscopy;  Laterality: N/A;   ESOPHAGOGASTRODUODENOSCOPY (EGD) WITH PROPOFOL N/A 08/02/2015   Procedure: ESOPHAGOGASTRODUODENOSCOPY (EGD) WITH PROPOFOL;  Surgeon: Scot Jun, MD;  Location: [facility] ENDOSCOPY;  Service: Endoscopy;  Laterality: N/A;   Patient Active Problem List   Diagnosis Date Noted   Hepatic steatosis 05/21/2019   Other proteinuria 01/25/2019   Vitamin D deficiency 04/13/2018   Gastroesophageal reflux disease with esophagitis 01/22/2018   Irritable  bowel syndrome with diarrhea 04/03/2016   Lymphedema 01/17/2016   Venous (peripheral) insufficiency 01/17/2016   Pain in limb 01/17/2016   Iron deficiency anemia due to chronic blood loss 01/09/2016   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   History of endometrial cancer 08/01/2015   Endometrial adenocarcinoma (HCC) 07/13/2015    Class: History of   Inguinal lymphadenopathy 07/13/2015   Long term current use of insulin (HCC) 12/12/2013   Microalbuminuria 12/12/2013   Diabetes (HCC) 07/28/2013   BP (high blood pressure) 07/28/2013   HLD (hyperlipidemia) 07/28/2013   Adiposity 07/28/2013   Obstructive apnea 07/28/2013   Arthritis, degenerative 07/28/2013   Apnea, sleep 07/28/2013   Thyroid nodule 07/28/2013    PCP: Barbette Reichmann, MD  REFERRING PROVIDER: Barbette Reichmann, MD  REFERRING DIAG: OA of right knee  THERAPY DIAG:  Chronic pain of right knee  Rationale for Evaluation and Treatment: Rehabilitation  ONSET DATE: Fall of 2023  SUBJECTIVE:   SUBJECTIVE STATEMENT:  Pt is reporting 7/10 pain upon arrival to the clinic.  Pt states she is having some complications with her stomach, but otherwise is doing well.     PERTINENT HISTORY: Pt is a 62 y.o. female who presents with chronic R knee pain that started in the Fall of 2023. Has done multiple bouts of PT with favorable results, then pain returns, per pt "because I don't keep up with my  exercises". Pt reports pain during walking and climbing stairs. Pt cannot recall a particular incidence that brought on her R knee pain this episode. Pt reports it's intermittently felt like it's going to "give out". Pt states the pain has worsened since onset. Pt reports it's hard to WB and move around on the R knee. She recalls one instance where she had pain in her R knee at night and woke up the next morning with swelling in her calf and ankle. No nocturnal awakening. Morning stiffness alleviates after about 30 mins of movement. Mechanical sx  present especially with walking, stairs, and getting in and out of the car. ON/OFF: R knee pain comes on slowly and it takes a while for it to subside. Pt reports in a typical day she experiences pain when she's on it for a long time. Pt mentions rest, ice, heat, and Tylenol provide temporary relief for her pain. No falls in the past 6 months. Pt ambulates using SPC with the exception of short distances inside her apartment. Pt is on disability and enjoys reading the Bible, talking on the phone, watching movies, and singing with her sister, [name]. PMH: diabetes (takes a shot to manage sx) and hx of CA (10 years in remission). Pt denies N/V, B&B changes, unexplained weight fluctuation, saddle paresthesia, fever, night sweats, or unrelenting night pain at this time.    PAIN: Are you having pain? Yes: NPRS scale: C - 7.5/10; W - 9.5/10 (up on it and walking); B - 5/10 Pain location: Wraps around anterior and posterior aspects of R knee Pain description: Achy Aggravating factors: Walking, stairs, and getting in and out of the car Relieving factors: Propping it up; ice and heat; Tylenol  PRECAUTIONS: None  WEIGHT BEARING RESTRICTIONS: No  FALLS: Has patient fallen in last 6 months? No  LIVING ENVIRONMENT: Lives with:  Sister Lives in: House/apartment Stairs: Handicap accessible apartment complex Has following equipment at home: Single point cane   OCCUPATION: Unemployed (on disability)  PLOF: Independent  PATIENT GOALS: Get back to ADLs pain-free.  NEXT MD VISIT: N/A  OBJECTIVE:   DIAGNOSTIC FINDINGS: N/A  PATIENT SURVEYS:  FOTO 71  COGNITION: Overall cognitive status: Within functional limits for tasks assessed     SENSATION: WFL  EDEMA:  N/A  MUSCLE LENGTH: Not assessed  POSTURE: rounded shoulders, forward head, increased lumbar lordosis, and flexed trunk   PALPATION: No joint line tenderness present  LOWER EXTREMITY ROM:  Active ROM Right eval Left eval  Right 07/07/22 Left 07/07/22  Hip flexion      Hip extension Unable to formally test- pt unwilling to lay prone- but likely at least 25% limited, visualized in gait and standing    Hip abduction      Hip adduction      Hip internal rotation      Hip external rotation      Knee flexion 52 63 76 deg 78 deg  Knee extension 7 7 6  deg 2 deg  Ankle dorsiflexion      Ankle plantarflexion      Ankle inversion      Ankle eversion       (Blank rows = not tested)  LOWER EXTREMITY MMT:  MMT Right eval Left eval  Hip flexion 4 4  Hip extension    Hip abduction    Hip adduction    Hip internal rotation    Hip external rotation    Knee flexion    Knee extension 3+ 4  Ankle dorsiflexion 4 4  Ankle plantarflexion 5 5  Ankle inversion 5 5  Ankle eversion 5 5   (Blank rows = not tested)  PAMs: - Tibiofemoral lateral glide: painful (concordant); hypomobile - Tibiofemoral medial glide: painful but felt a little better with repeated motion; hypomobile - Tibiofemoral distraction: pt experienced relief of sx - Patellar mobs:  - Superior and inferior glides: painful (concordant) - Lateral and medial glides: painful but felt better with repeated motion; hypomobile  LOWER EXTREMITY SPECIAL TESTS:  Knee special tests: Anterior drawer test: negative, Posterior drawer test: negative, Lachman Test: negative, and Apley's test: negative Thessalys Negative  FUNCTIONAL TESTS:  10 meter walk test: self-selected - 0.63 m/s; fastest - 0.83 m/s  GAIT: Distance walked: 10 meters Assistive device utilized: Single point cane Level of assistance: Complete Independence Comments: Decreased step and stride length, ataxic gait - shuffled feet; minimal knee flexion   TODAY'S TREATMENT: DATE: 07/07/22   Therex: - Nustep seat 6 UE 12 L3 5 mins for gentle RLE mobility and strengthening; SPM 80-85 throughout  Pt given updated HEP as noted and is able to demonstrate proper form with exercises.  Goal  assessment performed and noted below:     PATIENT EDUCATION:  Education details: Pt was educated on anatomical structures of the knee and why different positioning initiates symptoms. Pt also educated on hamstring stretch and adductor stretch which were given as HEP. Pt instructed and agreed to PT POC and treatment plan. Person educated: Patient Education method: Explanation, Demonstration, Tactile cues, Verbal cues, and Handouts Education comprehension: verbalized understanding, returned demonstration, and verbal cues required  HOME EXERCISE PROGRAM: Access Code: [email or web address] URL: [email or web address] Date: 07/07/2022 Prepared by: Tomasa Hose  Exercises - Supine Heel Slides  - 1 x daily - 7 x weekly - 3 sets - 10 reps - 2-3 hold - Seated Long Arc Quad  - 1 x daily - 7 x weekly - 3 sets - 10 reps - 2-3 hold - Seated Hamstring Stretch  - 1 x daily - 7 x weekly - 1 sets - 3 reps - 30 hold - Seated Hamstring Stretch (Mirrored)  - 1 x daily - 7 x weekly - 1 sets - 3 reps - 30 hold - Mini Squat with Counter Support  - 1 x daily - 7 x weekly - 3 sets - 10 reps - Standing Hip Abduction with Counter Support  - 1 x daily - 7 x weekly - 3 sets - 10 reps - Standing Hip Extension with Counter Support  - 1 x daily - 7 x weekly - 3 sets - 10 reps  ASSESSMENT:  CLINICAL IMPRESSION:  Pt has not progressed in therapy as expected, therefore pt is discharged at this time.  Pt has had some health complications that have limited her ability to perform exercises at times and improve overall ROM and strength of the B LE's.  Pt understanding of goal assessment and is ready for discharge at this time.   Pt advised to contact office if any concerns or questions arise in the meantime.  Pt is discharged at this time.    OBJECTIVE IMPAIRMENTS: Abnormal gait, decreased activity tolerance, decreased balance, decreased endurance, decreased mobility, difficulty walking, decreased ROM, decreased strength,  obesity, and pain.   ACTIVITY LIMITATIONS: carrying, lifting, standing, squatting, dressing, and locomotion level  PARTICIPATION LIMITATIONS: cleaning, community activity, and ADLs that require pt to WB on LLE  PERSONAL FACTORS: Fitness are also affecting patient's functional outcome.   REHAB POTENTIAL:  Fair pt has taken ownership of not keeping up with her HEP after discharge so has a history of coming back   CLINICAL DECISION MAKING: Stable/uncomplicated  EVALUATION COMPLEXITY: Low   GOALS: Goals reviewed with patient? Yes  SHORT TERM GOALS: Target date: 05/06/22 Pt will execute HEP independently with proper form and technique in order to return to PLOF at home. Baseline: HEP given on 04/08/22 Goal status: INITIAL   LONG TERM GOALS: Target date: 06/03/22  Pt will increase FOTO score to 66 to demonstrate predicted increase in functional mobility to complete ADLs. Baseline: 52 07/07/22: 54 Goal status: INITIAL  2.  Pt will demonstrate speed of at least 1.0 m/s to demonstrate decreased fall risk and increased independence in community ambulation Baseline: self-selected - 0.63 m/s; fastest - 0.83 m/s 07/07/22: self-selected - 0.49 m/s; fastest - 0.0.52 m/s Goal status: INITIAL  3.  Pt will demonstrate L knee mobility of 0-110d in order to be able to demonstrate normalized gait mechanics needed for household and community ambulation Baseline: 7-52d 07/07/22: 6/76 deg Goal status: INITIAL  4.  Pt will decrease worst pain as reported on NPS by at least 3 points in order to demonstrate clinically significant reduction in pain. Baseline: NPS - 9.5/10 07/07/22: 8.5/10 Goal status: INITIAL    PLAN:  PT FREQUENCY: 2x/week  PT DURATION: 8 weeks  PLANNED INTERVENTIONS: Therapeutic exercises, Therapeutic activity, Neuromuscular re-education, Balance training, Gait training, Patient/Family education, and Stair training  PLAN FOR NEXT SESSION:  Pt is d/c at this time.   Nolon Bussing, PT, DPT Physical Therapist - [facility]  07/07/22, 3:06 PM

## 2022-08-04 MED FILL — Ferumoxytol Inj 510 MG/17ML (30 MG/ML) (Elemental Fe): INTRAVENOUS | Qty: 17 | Status: AC

## 2022-08-05 ENCOUNTER — Inpatient Hospital Stay: Payer: 59

## 2022-08-05 ENCOUNTER — Inpatient Hospital Stay: Payer: 59 | Admitting: Internal Medicine

## 2022-08-11 ENCOUNTER — Other Ambulatory Visit: Payer: Self-pay | Admitting: Internal Medicine

## 2022-08-11 DIAGNOSIS — Z1231 Encounter for screening mammogram for malignant neoplasm of breast: Secondary | ICD-10-CM

## 2022-08-25 ENCOUNTER — Encounter: Payer: Self-pay | Admitting: Internal Medicine

## 2022-08-25 ENCOUNTER — Inpatient Hospital Stay: Payer: 59 | Attending: Internal Medicine | Admitting: Internal Medicine

## 2022-08-25 ENCOUNTER — Inpatient Hospital Stay: Payer: 59

## 2022-08-25 VITALS — BP 156/66 | HR 87 | Temp 97.4°F | Resp 16 | Ht 63.0 in | Wt 272.0 lb

## 2022-08-25 DIAGNOSIS — D500 Iron deficiency anemia secondary to blood loss (chronic): Secondary | ICD-10-CM | POA: Diagnosis not present

## 2022-08-25 DIAGNOSIS — Z882 Allergy status to sulfonamides status: Secondary | ICD-10-CM | POA: Diagnosis not present

## 2022-08-25 DIAGNOSIS — D75839 Thrombocytosis, unspecified: Secondary | ICD-10-CM | POA: Diagnosis not present

## 2022-08-25 DIAGNOSIS — K648 Other hemorrhoids: Secondary | ICD-10-CM | POA: Insufficient documentation

## 2022-08-25 DIAGNOSIS — Z833 Family history of diabetes mellitus: Secondary | ICD-10-CM | POA: Diagnosis not present

## 2022-08-25 DIAGNOSIS — Z90710 Acquired absence of both cervix and uterus: Secondary | ICD-10-CM | POA: Diagnosis not present

## 2022-08-25 DIAGNOSIS — Z8542 Personal history of malignant neoplasm of other parts of uterus: Secondary | ICD-10-CM | POA: Diagnosis not present

## 2022-08-25 DIAGNOSIS — Z79899 Other long term (current) drug therapy: Secondary | ICD-10-CM | POA: Insufficient documentation

## 2022-08-25 LAB — BASIC METABOLIC PANEL
Anion gap: 9 (ref 5–15)
BUN: 12 mg/dL (ref 6–20)
CO2: 30 mmol/L (ref 22–32)
Calcium: 8.8 mg/dL — ABNORMAL LOW (ref 8.9–10.3)
Chloride: 100 mmol/L (ref 98–111)
Creatinine, Ser: 0.74 mg/dL (ref 0.44–1.00)
GFR, Estimated: >60 mL/min (ref >60)
Glucose, Bld: 206 mg/dL — ABNORMAL HIGH (ref 70–99)
Potassium: 4 mmol/L (ref 3.5–5.1)
Sodium: 139 mmol/L (ref 135–145)

## 2022-08-25 LAB — CBC WITH DIFFERENTIAL/PLATELET
Abs Immature Granulocytes: 0.08 10*3/uL — ABNORMAL HIGH (ref 0.00–0.07)
Basophils Absolute: 0.1 10*3/uL (ref 0.0–0.1)
Basophils Relative: 1 %
Eosinophils Absolute: 0.3 10*3/uL (ref 0.0–0.5)
Eosinophils Relative: 3 %
HCT: 37.4 % (ref 36.0–46.0)
Hemoglobin: 11.6 g/dL — ABNORMAL LOW (ref 12.0–15.0)
Immature Granulocytes: 1 %
Lymphocytes Relative: 22 %
Lymphs Abs: 2.3 10*3/uL (ref 0.7–4.0)
MCH: 25.2 pg — ABNORMAL LOW (ref 26.0–34.0)
MCHC: 31 g/dL (ref 30.0–36.0)
MCV: 81.1 fL (ref 80.0–100.0)
Monocytes Absolute: 0.7 10*3/uL (ref 0.1–1.0)
Monocytes Relative: 6 %
Neutro Abs: 6.9 10*3/uL (ref 1.7–7.7)
Neutrophils Relative %: 67 %
Platelets: 417 10*3/uL — ABNORMAL HIGH (ref 150–400)
RBC: 4.61 MIL/uL (ref 3.87–5.11)
RDW: 14.5 % (ref 11.5–15.5)
WBC: 10.3 10*3/uL (ref 4.0–10.5)
nRBC: 0 % (ref 0.0–0.2)

## 2022-08-25 LAB — IRON AND TIBC
Iron: 59 ug/dL (ref 28–170)
Saturation Ratios: 17 % (ref 10.4–31.8)
TIBC: 342 ug/dL (ref 250–450)
UIBC: 283 ug/dL

## 2022-08-25 LAB — FERRITIN: Ferritin: 88 ng/mL (ref 11–307)

## 2022-08-25 NOTE — Progress Notes (Signed)
Pt feels pretty good. Has fatigue only if she over exerts herself. No blood in stool lately. Mild dyspnea with extended walking.

## 2022-08-25 NOTE — Assessment & Plan Note (Addendum)
Iron deficiency anemia-question etiology. NOV 2022- Today 11.6 Iron studies pending.  Currently iron PO one a day.  Recommend Iron PO BID. No IV iron today.   # History of endometrial cancer status post surgery-no adjuvant therapy; recent CT -NEG; contonue follow up with Gyn, Dr.Schermerhorn- stable.   # Mild thrombocytosis: likely secondary-  MPN panel- NEG.   Monitor for now.    DISPOSITION: labs print out # NO infusion  #  follow-up in 6 moths- MD- iron studies-Ferritin/CBC BMP; possible ferrahem IV- Dr.B

## 2022-08-25 NOTE — Progress Notes (Signed)
[facility] OFFICE PROGRESS NOTE  Patient Care Team: Barbette Reichmann, MD as PCP - General (Internal Medicine) Benita Gutter, RN as Registered Nurse Earna Coder, MD as Consulting Physician (Oncology)   Cancer Staging  No matching staging information was found for the patient.   Oncology History  Endometrial adenocarcinoma (HCC)  07/13/2015 Initial Diagnosis   Endometrial adenocarcinoma (HCC)    Anemia of iron deficiency unresponsive to oral iron therapy - got IV Venofer 1 g in Feb 2015, then on oral Ferrous sulfate 1 tablet daily. (labs on 03/28/13 had showed hemoglobin 10.0, MCV 71.3, WBC 10,800, ANC 7990, ALC 1640, platelet count 564, ferritin 27, serum iron 33, iron saturation low at 7%, TIBC 452. In June 2014 - hemoglobin 9.8, WBC 10,700, platelets 551, LFT unremarkable except alkaline phos of 120, creatinine 0.6). Stool Hemoccult was negative x2 in November 2014.  Colonoscopy July 2013 had shown internal hemorrhoids.  INTERVAL HISTORY: Alone.  Ambulating with a cane.  Morbidly obese.  Christine Lawson 62 y.o.  female pleasant patient above history of iron deficiency anemia; also history of early stage endometrial cancer is here for follow-up.  Patient denies any nausea vomiting abdominal pain.  Denies any fatigue.  Patient denies any blood in stools or black-colored stools.  Patient is on iron pills once a day.  Review of Systems  Constitutional:  Negative for chills, diaphoresis, fever, malaise/fatigue and weight loss.  HENT:  Negative for nosebleeds and sore throat.   Eyes:  Negative for double vision.  Respiratory:  Negative for cough, hemoptysis, sputum production and wheezing.   Cardiovascular:  Negative for chest pain, palpitations, orthopnea and leg swelling.  Gastrointestinal:  Negative for abdominal pain, blood in stool, constipation, diarrhea, heartburn, melena, nausea and vomiting.  Genitourinary:  Negative for dysuria, frequency and  urgency.  Musculoskeletal:  Negative for back pain and joint pain.  Skin: Negative.  Negative for itching and rash.  Neurological:  Negative for dizziness, tingling, focal weakness, weakness and headaches.  Endo/Heme/Allergies:  Does not bruise/bleed easily.  Psychiatric/Behavioral:  Negative for depression. The patient is not nervous/anxious and does not have insomnia.       PAST MEDICAL HISTORY :  Past Medical History:  Diagnosis Date   Diabetes mellitus without complication (HCC)    Endometrial adenocarcinoma (HCC)    GERD (gastroesophageal reflux disease)    Hematochezia    Hyperlipidemia    Hypertension    IDA (iron deficiency anemia)    Inguinal lymphadenopathy 07/13/2015   Irritable bowel syndrome    Obesity    Osteoarthritis    Sleep apnea     PAST SURGICAL HISTORY :   Past Surgical History:  Procedure Laterality Date   ABDOMINAL HYSTERECTOMY  feb 2013   CARPAL TUNNEL RELEASE Left 03/20/2021   Procedure: CARPAL TUNNEL RELEASE ENDOSCOPIC;  Surgeon: Christena Flake, MD;  Location: [facility] ORS;  Service: Orthopedics;  Laterality: Left;   COLONOSCOPY WITH PROPOFOL N/A 08/02/2015   Procedure: COLONOSCOPY WITH PROPOFOL;  Surgeon: Scot Jun, MD;  Location: [facility] ENDOSCOPY;  Service: Endoscopy;  Laterality: N/A;   ESOPHAGOGASTRODUODENOSCOPY (EGD) WITH PROPOFOL N/A 08/02/2015   Procedure: ESOPHAGOGASTRODUODENOSCOPY (EGD) WITH PROPOFOL;  Surgeon: Scot Jun, MD;  Location: [facility] ENDOSCOPY;  Service: Endoscopy;  Laterality: N/A;    FAMILY HISTORY :   Family History  Problem Relation Age of Onset   Diabetes Mother    Diabetes Father    Breast cancer Neg Hx     SOCIAL HISTORY:  Social History   Tobacco Use   Smoking status: Never   Smokeless tobacco: Never  Substance Use Topics   Alcohol use: No   Drug use: No    ALLERGIES:  is allergic to solifenacin, contrast media [iodinated contrast media], dicyclomine, isovue [iopamidol], and sulfa  antibiotics.  MEDICATIONS:  Current Outpatient Medications  Medication Sig Dispense Refill   albuterol (PROVENTIL HFA;VENTOLIN HFA) 108 (90 Base) MCG/ACT inhaler Inhale 1 puff into the lungs every 6 (six) hours as needed for wheezing or shortness of breath.     amLODipine (NORVASC) 10 MG tablet Take 10 mg by mouth daily.     ascorbic acid (VITAMIN C) 500 MG tablet Take 500 mg by mouth daily.     aspirin 81 MG EC tablet Take 81 mg by mouth daily. Swallow whole.     budesonide-formoterol (SYMBICORT) 80-4.5 MCG/ACT inhaler Inhale 2 puffs into the lungs 2 (two) times daily as needed (shortness of breath).     cholecalciferol (VITAMIN D) 1000 UNITS tablet Take 1,000 Units by mouth daily.     diclofenac sodium (VOLTAREN) 1 % GEL Apply 1 application topically 2 (two) times daily as needed for pain.     enalapril (VASOTEC) 5 MG tablet Take 5 mg by mouth daily.     ferrous sulfate 325 (65 FE) MG tablet Take 325 mg by mouth 2 (two) times daily with a meal.      HUMALOG KWIKPEN 100 UNIT/ML KwikPen Inject 26-60 Units into the skin 3 (three) times daily. Inject 26 units into the skin with breakfast and lunch and inject 60 units with supper     insulin glargine, 2 Unit Dial, (TOUJEO MAX SOLOSTAR) 300 UNIT/ML Solostar Pen Inject 120-140 Units into the skin daily.     montelukast (SINGULAIR) 10 MG tablet Take 10 mg by mouth daily.     pantoprazole (PROTONIX) 40 MG tablet Take 40 mg by mouth daily.     Peppermint Oil (IBGARD PO) Take 2 tablets by mouth daily.     rosuvastatin (CRESTOR) 5 MG tablet Take 5 mg by mouth daily.     VIBERZI 75 MG TABS Take 1 tablet by mouth 2 (two) times daily.     vitamin B-12 (CYANOCOBALAMIN) 500 MCG tablet Take 500 mcg by mouth daily.     No current facility-administered medications for this visit.    PHYSICAL EXAMINATION: ECOG PERFORMANCE STATUS: 0 - Asymptomatic  BP (!) 156/66 (BP Location: Left Arm, Patient Position: Sitting)   Pulse 87   Temp (!) 97.4 F (36.3 C)  (Tympanic)   Resp 16   Ht 5\' 3"  (1.6 m)   Wt 272 lb (123.4 kg)   SpO2 97%   BMI 48.18 kg/m   Filed Weights   08/25/22 1338  Weight: 272 lb (123.4 kg)    Physical Exam Constitutional:      Comments: Alone.  Walking with a cane.  Obese.  HENT:     Head: Normocephalic and atraumatic.     Mouth/Throat:     Pharynx: No oropharyngeal exudate.  Eyes:     Pupils: Pupils are equal, round, and reactive to light.  Cardiovascular:     Rate and Rhythm: Normal rate and regular rhythm.  Pulmonary:     Effort: No respiratory distress.     Breath sounds: No wheezing.  Abdominal:     General: Bowel sounds are normal. There is no distension.     Palpations: Abdomen is soft. There is no mass.  Tenderness: There is no abdominal tenderness. There is no guarding or rebound.  Musculoskeletal:        General: No tenderness. Normal range of motion.     Cervical back: Normal range of motion and neck supple.  Skin:    General: Skin is warm.  Neurological:     Mental Status: She is alert and oriented to person, place, and time.  Psychiatric:        Mood and Affect: Affect normal.     LABORATORY DATA:  I have reviewed the data as listed    Component Value Date/Time   NA 139 08/25/2022 1319   NA 141 06/03/2013 2039   K 4.0 08/25/2022 1319   K 3.7 06/03/2013 2039   CL 100 08/25/2022 1319   CL 106 06/03/2013 2039   CO2 30 08/25/2022 1319   CO2 30 06/03/2013 2039   GLUCOSE 206 (H) 08/25/2022 1319   GLUCOSE 59 (L) 06/03/2013 2039   BUN 12 08/25/2022 1319   BUN 6 (L) 06/03/2013 2039   CREATININE 0.74 08/25/2022 1319   CREATININE 0.59 (L) 06/03/2013 2039   CALCIUM 8.8 (L) 08/25/2022 1319   CALCIUM 8.8 06/03/2013 2039   PROT 7.9 11/17/2018 1313   PROT 8.8 (H) 06/03/2013 2039   ALBUMIN 3.6 11/17/2018 1313   ALBUMIN 3.6 06/03/2013 2039   AST 26 11/17/2018 1313   AST 38 (H) 06/03/2013 2039   ALT 24 11/17/2018 1313   ALT 44 06/03/2013 2039   ALKPHOS 118 11/17/2018 1313   ALKPHOS 134  (H) 06/03/2013 2039   BILITOT 0.7 11/17/2018 1313   BILITOT 0.4 06/03/2013 2039   GFRNONAA >60 08/25/2022 1319   GFRNONAA >60 06/03/2013 2039   GFRAA >60 07/05/2019 1239   GFRAA >60 06/03/2013 2039    No results found for: "SPEP", "UPEP"  Lab Results  Component Value Date   WBC 10.3 08/25/2022   NEUTROABS 6.9 08/25/2022   HGB 11.6 (L) 08/25/2022   HCT 37.4 08/25/2022   MCV 81.1 08/25/2022   PLT 417 (H) 08/25/2022      Chemistry      Component Value Date/Time   NA 139 08/25/2022 1319   NA 141 06/03/2013 2039   K 4.0 08/25/2022 1319   K 3.7 06/03/2013 2039   CL 100 08/25/2022 1319   CL 106 06/03/2013 2039   CO2 30 08/25/2022 1319   CO2 30 06/03/2013 2039   BUN 12 08/25/2022 1319   BUN 6 (L) 06/03/2013 2039   CREATININE 0.74 08/25/2022 1319   CREATININE 0.59 (L) 06/03/2013 2039      Component Value Date/Time   CALCIUM 8.8 (L) 08/25/2022 1319   CALCIUM 8.8 06/03/2013 2039   ALKPHOS 118 11/17/2018 1313   ALKPHOS 134 (H) 06/03/2013 2039   AST 26 11/17/2018 1313   AST 38 (H) 06/03/2013 2039   ALT 24 11/17/2018 1313   ALT 44 06/03/2013 2039   BILITOT 0.7 11/17/2018 1313   BILITOT 0.4 06/03/2013 2039       RADIOGRAPHIC STUDIES: I have personally reviewed the radiological images as listed and agreed with the findings in the report. No results found.   ASSESSMENT & PLAN:  Iron deficiency anemia due to chronic blood loss Iron deficiency anemia-question etiology. NOV 2022- Today 11.6 Iron studies pending.  Currently iron PO one a day.  Recommend Iron PO BID. No IV iron today.   # History of endometrial cancer status post surgery-no adjuvant therapy; recent CT -NEG; contonue follow up with Gyn, Dr.Schermerhorn-  stable.   # Mild thrombocytosis: likely secondary-  MPN panel- NEG.   Monitor for now.    DISPOSITION: labs print out # NO infusion  #  follow-up in 6 moths- MD- iron studies-Ferritin/CBC BMP; possible ferrahem IV- Dr.B   Orders Placed This Encounter   Procedures   CBC with Differential (Cancer Center Only)    Standing Status:   Future    Standing Expiration Date:   08/25/2023   Basic metabolic panel    Standing Status:   Future    Standing Expiration Date:   08/25/2023   Iron and TIBC    Standing Status:   Future    Standing Expiration Date:   08/25/2023   Ferritin    Standing Status:   Future    Standing Expiration Date:   08/25/2023    All questions were answered. The patient knows to call the clinic with any problems, questions or concerns.      Earna Coder, MD 08/25/2022 2:33 PM

## 2022-09-22 ENCOUNTER — Ambulatory Visit
Admission: RE | Admit: 2022-09-22 | Discharge: 2022-09-22 | Disposition: A | Discharge status: Home / Self Care | Payer: 59 | Source: Ambulatory Visit | Attending: Internal Medicine | Admitting: Internal Medicine

## 2022-09-22 DIAGNOSIS — Z1231 Encounter for screening mammogram for malignant neoplasm of breast: Secondary | ICD-10-CM | POA: Diagnosis present

## 2022-11-12 ENCOUNTER — Ambulatory Visit: Payer: 59

## 2022-11-24 ENCOUNTER — Ambulatory Visit: Payer: 59

## 2022-11-27 ENCOUNTER — Ambulatory Visit: Payer: 59

## 2022-12-23 ENCOUNTER — Ambulatory Visit: Payer: 59 | Attending: Internal Medicine

## 2022-12-23 DIAGNOSIS — M25561 Pain in right knee: Secondary | ICD-10-CM | POA: Insufficient documentation

## 2022-12-23 DIAGNOSIS — G8929 Other chronic pain: Secondary | ICD-10-CM | POA: Insufficient documentation

## 2022-12-23 DIAGNOSIS — M6281 Muscle weakness (generalized): Secondary | ICD-10-CM | POA: Insufficient documentation

## 2022-12-23 NOTE — Therapy (Cosign Needed)
OUTPATIENT PHYSICAL THERAPY LOWER EXTREMITY EVALUATION   Patient Name: Paiyton Dickerman MRN: [id number] DOB:1960-05-27, 62 y.o., female Today's Date: 12/24/2022  END OF SESSION:  PT End of Session - 12/23/22 1626     Visit Number 1    Number of Visits 17    Date for PT Re-Evaluation 02/17/23    PT Start Time 1522    PT Stop Time 1607    PT Time Calculation (min) 45 min    Activity Tolerance Patient tolerated treatment well    Behavior During Therapy [facility] for tasks assessed/performed             Past Medical History:  Diagnosis Date   Diabetes mellitus without complication (HCC)    Endometrial adenocarcinoma (HCC)    GERD (gastroesophageal reflux disease)    Hematochezia    Hyperlipidemia    Hypertension    IDA (iron deficiency anemia)    Inguinal lymphadenopathy 07/13/2015   Irritable bowel syndrome    Obesity    Osteoarthritis    Sleep apnea    Past Surgical History:  Procedure Laterality Date   ABDOMINAL HYSTERECTOMY  feb 2013   CARPAL TUNNEL RELEASE Left 03/20/2021   Procedure: CARPAL TUNNEL RELEASE ENDOSCOPIC;  Surgeon: Christena Flake, MD;  Location: [facility] ORS;  Service: Orthopedics;  Laterality: Left;   COLONOSCOPY WITH PROPOFOL N/A 08/02/2015   Procedure: COLONOSCOPY WITH PROPOFOL;  Surgeon: Scot Jun, MD;  Location: [facility] ENDOSCOPY;  Service: Endoscopy;  Laterality: N/A;   ESOPHAGOGASTRODUODENOSCOPY (EGD) WITH PROPOFOL N/A 08/02/2015   Procedure: ESOPHAGOGASTRODUODENOSCOPY (EGD) WITH PROPOFOL;  Surgeon: Scot Jun, MD;  Location: [facility] ENDOSCOPY;  Service: Endoscopy;  Laterality: N/A;   Patient Active Problem List   Diagnosis Date Noted   Hepatic steatosis 05/21/2019   Other proteinuria 01/25/2019   Vitamin D deficiency 04/13/2018   Gastroesophageal reflux disease with esophagitis 01/22/2018   Irritable bowel syndrome with diarrhea 04/03/2016   Lymphedema 01/17/2016   Venous (peripheral) insufficiency 01/17/2016   Pain in limb 01/17/2016   Iron  deficiency anemia due to chronic blood loss 01/09/2016   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   History of endometrial cancer 08/01/2015   Endometrial adenocarcinoma (HCC) 07/13/2015    Class: History of   Inguinal lymphadenopathy 07/13/2015   Long term current use of insulin (HCC) 12/12/2013   Microalbuminuria 12/12/2013   Diabetes (HCC) 07/28/2013   BP (high blood pressure) 07/28/2013   HLD (hyperlipidemia) 07/28/2013   Adiposity 07/28/2013   Obstructive apnea 07/28/2013   Arthritis, degenerative 07/28/2013   Apnea, sleep 07/28/2013   Thyroid nodule 07/28/2013    PCP: Barbette Reichmann, MD  REFERRING PROVIDER: Barbette Reichmann, MD  REFERRING DIAG:  Acute Pain, R Knee    THERAPY DIAG:  Chronic pain of right knee  Muscle weakness (generalized)  Rationale for Evaluation and Treatment: Rehabilitation  ONSET DATE: Several years ago   SUBJECTIVE:   SUBJECTIVE STATEMENT: Pt is a pleasant 62 y/o F presenting to OPPT w/ chronic R knee pain.  PERTINENT HISTORY: Pt is a 64 y/p F with chronic R knee pain. Pt reports this pain began several years ago, however her most recent flare up was following a fall that occurred in June/ July of 2024 off of her bed. Pt well known to clinic and has had prior bouts of PT for same condition with known R knee OA. Pt reports 6-7/10 NPS in the medial aspect of the R knee at rest, and 9/10 NPS with activity. Pt's aggravating factors include prolonged  walking, standing, and curb/ stair negotiation. Pt describes her pain as sharp and is felt mostly with knee flexion. Massaging her R knee and taking Tylenol daily, temporarily help to relieve pt's R knee pain. Pt reports intermittent N/T down the RLE.   PAIN:  Are you having pain? Yes: NPRS scale: 6-7/10 Pain location: Medial aspect of the R knee  Pain description: Sharp Aggravating factors: Prolonged walking, standing up, walking up stairs/ curbs, knee flexion Relieving factors: massaging, Tylenol  every day  9/10 R knee at worst with activity  PRECAUTIONS: None  RED FLAGS: None   WEIGHT BEARING RESTRICTIONS: No  FALLS:  Has patient fallen in last 6 months? Yes. Number of falls 1 June or July, 2024   LIVING ENVIRONMENT: Has following equipment at home: Dan Humphreys - 2 wheeled, [employer]  OCCUPATION: Retired  PLOF: Independent  PATIENT GOALS: "Try to get the leg to bend more and not pop".  NEXT MD VISIT: N/A  OBJECTIVE:  Note: Objective measures were completed at Evaluation unless otherwise noted.  DIAGNOSTIC FINDINGS: N/A  PATIENT SURVEYS:  FOTO 59/72  COGNITION: Overall cognitive status: Within functional limits for tasks assessed     SENSATION: LQNS: WFL  EDEMA:  Bilat LE edema noted 2/2 to pt dx with lymphedema.   MUSCLE LENGTH: Hamstrings (straight leg): Right 28 deg; Left 46 deg  POSTURE: rounded shoulders  PALPATION: TTP at medial and lateral aspect of anterior R and L knee.   LOWER EXTREMITY ROM:  Active ROM Right eval Left eval  Hip flexion    Hip extension    Hip abduction    Hip adduction    Hip internal rotation    Hip external rotation    Knee flexion 40* 91*  Knee extension 0 0  Ankle dorsiflexion    Ankle plantarflexion    Ankle inversion    Ankle eversion     (Blank rows = not tested)  LOWER EXTREMITY MMT:  MMT Right eval Left eval  Hip flexion 4- 4-  Hip extension    Hip abduction (screened in sitting)  4 4  Hip adduction (screened in sitting) 4 4  Hip internal rotation 4- 4-  Hip external rotation 4- 4-  Knee flexion 4 4  Knee extension 4 4  Ankle dorsiflexion 4 4  Ankle plantarflexion    Ankle inversion    Ankle eversion     (Blank rows = not tested)  LOWER EXTREMITY SPECIAL TESTS:  Hip special tests: Luisa Hart (FABER) test: negative and modified FADDIR: negative Knee special tests: Lachman Test: negative   FUNCTIONAL TESTS:  5 times sit to stand: 19.85 seconds 10 meter walk test: 0.52m/s  seconds  GAIT: Assistive device utilized: Quad cane small base Level of assistance: Modified independence Comments: Pt notes decreased stance phase and knee flexion of the RLE, significant RLE eversion and abduction also noted with ambulation. Circumductory in RLE swing phase due to limited R knee flexion.   TODAY'S TREATMENT:  DATE: 12/23/22  Eval only.     PATIENT EDUCATION:  Education details: Pt educated on utilizing the quad cane in the LUE instead of the RUE to maximize support of AD, and verbalized and demonstrated understanding.  Person educated: Patient Education method: Medical illustrator Education comprehension: verbalized understanding and returned demonstration  HOME EXERCISE PROGRAM: Deferred to next session.   ASSESSMENT:  CLINICAL IMPRESSION: Patient is a 62 y.o. F who was seen today for physical therapy evaluation and treatment for chronic R knee pain. Pt demonstrates moderate joint hypomobility in all planes w/ limited patellar movement and decreased bilat knee flexion AROM ( R: 40 deg, L: 91 deg), however is able to achieve terminal knee extension in bilat LE's. Pt presents with deficits including proximal hip weakness, painful WB with prolonged walking and stair negotiation, and significant pain limiting functional mobility.TTP noted w/ palpation at the medial and lateral aspect of the R knee. ACL testing of the R knee w/ Lachmans test resulted in negative w/ no concordant pain. These deficits are limiting participation in prolonged walking ADL's and stair negotiation due to moderate pain levels. Pt scoring below the age- matched norms (11.4 seconds) for her 5xSTS which was at 19.85 seconds and is categorized as a limited community ambulator based on her time (0.52m/s). Pt educated on proper quad cane management and usage of the  quad cane in the LUE as opposed to the RUE. Pt will benefit from skilled PT services to address these deficits to reduce pain and maximize functional capacity with walking, steps, and household ADL's.   OBJECTIVE IMPAIRMENTS: Abnormal gait, decreased balance, decreased knowledge of use of DME, decreased mobility, difficulty walking, decreased ROM, decreased strength, hypomobility, increased edema, impaired flexibility, and pain.   ACTIVITY LIMITATIONS: carrying, lifting, bending, standing, squatting, stairs, and transfers  PARTICIPATION LIMITATIONS: meal prep, cleaning, and community activity  PERSONAL FACTORS: Age, Time since onset of injury/illness/exacerbation, and 1-2 comorbidities: lymphedema, DM  are also affecting patient's functional outcome.   REHAB POTENTIAL: Fair multi- comorbidities  CLINICAL DECISION MAKING: Stable/uncomplicated  EVALUATION COMPLEXITY: Low   GOALS: Goals reviewed with patient? Yes  SHORT TERM GOALS: Target date: 01/20/23 Pt will be independent with HEP to improve R knee strength and decrease pain with functional activities  Baseline: 12/23/22: HEP deferred to next session.  Goal status: INITIAL  LONG TERM GOALS: Target date: 02/17/23  Pt will improve FOTO to target score to demonstrate clinically significant improvement in functional mobility.  Baseline: 12/23/22: 59/73 Goal status: INITIAL  2. Pt will report <7/10 pain in R knee w/ prolonged ambulation to display clinically significant pain improvement. Baseline: 12/23/22: 9/10 Goal status: INITIAL  3. Pt will improve 5xSTS to </= 11.4 seconds to display improvements in LE strength and equal age- matched norms.  Baseline: 12/23/22: 19.85 seconds Goal status: INITIAL  4. Pt will improve to >/= 1.0 m/s to display improvements in LE strength and functional mobility to be categorized as a limited community ambulator.  Baseline: 12/23/22: 0.48m/s (household ambulator)  Goal status:  INITIAL    PLAN:  PT FREQUENCY: 1-2x/week  PT DURATION: 8 weeks  PLANNED INTERVENTIONS: 97164- PT Re-evaluation, 97110-Therapeutic exercises, 97530- Therapeutic activity, 97112- Neuromuscular re-education, 97535- Self Care, 22025- Manual therapy, [phone]- Gait training, Patient/Family education, Balance training, Stair training, Joint mobilization, Joint manipulation, Cryotherapy, and Moist heat  PLAN FOR NEXT SESSION: Prescribe HEP, TUG time   Lovie Macadamia, SPT  Delphia Grates. Fairly IV, PT, DPT Physical Therapist- [place]  [facility]  12/24/2022, 11:58 AM

## 2022-12-24 NOTE — Addendum Note (Signed)
Addended by: Georgia Duff IV on: 12/24/2022 12:02 PM   Modules accepted: Orders

## 2023-01-14 ENCOUNTER — Ambulatory Visit: Payer: 59 | Attending: Internal Medicine

## 2023-01-14 DIAGNOSIS — M6281 Muscle weakness (generalized): Secondary | ICD-10-CM | POA: Diagnosis present

## 2023-01-14 DIAGNOSIS — M25561 Pain in right knee: Secondary | ICD-10-CM | POA: Insufficient documentation

## 2023-01-14 DIAGNOSIS — G8929 Other chronic pain: Secondary | ICD-10-CM | POA: Diagnosis present

## 2023-01-14 NOTE — Therapy (Signed)
OUTPATIENT PHYSICAL THERAPY LOWER EXTREMITY TREATMENT   Patient Name: Christine Lawson MRN: [id number] DOB:1960/06/06, 62 y.o., female Today's Date: 01/14/2023  END OF SESSION:  PT End of Session - 01/14/23 1435     Visit Number 2    Number of Visits 17    Date for PT Re-Evaluation 02/17/23    PT Start Time 1430    PT Stop Time 1514    PT Time Calculation (min) 44 min    Activity Tolerance Patient tolerated treatment well    Behavior During Therapy [facility] for tasks assessed/performed             Past Medical History:  Diagnosis Date   Diabetes mellitus without complication (HCC)    Endometrial adenocarcinoma (HCC)    GERD (gastroesophageal reflux disease)    Hematochezia    Hyperlipidemia    Hypertension    IDA (iron deficiency anemia)    Inguinal lymphadenopathy 07/13/2015   Irritable bowel syndrome    Obesity    Osteoarthritis    Sleep apnea    Past Surgical History:  Procedure Laterality Date   ABDOMINAL HYSTERECTOMY  feb 2013   CARPAL TUNNEL RELEASE Left 03/20/2021   Procedure: CARPAL TUNNEL RELEASE ENDOSCOPIC;  Surgeon: Christena Flake, MD;  Location: [facility] ORS;  Service: Orthopedics;  Laterality: Left;   COLONOSCOPY WITH PROPOFOL N/A 08/02/2015   Procedure: COLONOSCOPY WITH PROPOFOL;  Surgeon: Scot Jun, MD;  Location: [facility] ENDOSCOPY;  Service: Endoscopy;  Laterality: N/A;   ESOPHAGOGASTRODUODENOSCOPY (EGD) WITH PROPOFOL N/A 08/02/2015   Procedure: ESOPHAGOGASTRODUODENOSCOPY (EGD) WITH PROPOFOL;  Surgeon: Scot Jun, MD;  Location: [facility] ENDOSCOPY;  Service: Endoscopy;  Laterality: N/A;   Patient Active Problem List   Diagnosis Date Noted   Hepatic steatosis 05/21/2019   Other proteinuria 01/25/2019   Vitamin D deficiency 04/13/2018   Gastroesophageal reflux disease with esophagitis 01/22/2018   Irritable bowel syndrome with diarrhea 04/03/2016   Lymphedema 01/17/2016   Venous (peripheral) insufficiency 01/17/2016   Pain in limb 01/17/2016   Iron  deficiency anemia due to chronic blood loss 01/09/2016   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   History of endometrial cancer 08/01/2015   Endometrial adenocarcinoma (HCC) 07/13/2015    Class: History of   Inguinal lymphadenopathy 07/13/2015   Long term current use of insulin (HCC) 12/12/2013   Microalbuminuria 12/12/2013   Diabetes (HCC) 07/28/2013   BP (high blood pressure) 07/28/2013   HLD (hyperlipidemia) 07/28/2013   Adiposity 07/28/2013   Obstructive apnea 07/28/2013   Arthritis, degenerative 07/28/2013   Apnea, sleep 07/28/2013   Thyroid nodule 07/28/2013    PCP: Barbette Reichmann, MD  REFERRING PROVIDER: Barbette Reichmann, MD  REFERRING DIAG:  Acute Pain, R Knee    THERAPY DIAG:  Chronic pain of right knee  Muscle weakness (generalized)  Rationale for Evaluation and Treatment: Rehabilitation  ONSET DATE: Several years ago   SUBJECTIVE:   SUBJECTIVE STATEMENT: Pt reports 8/10 NPS R knee pain. Denies falls.   PERTINENT HISTORY: Pt is a 26 y/p F with chronic R knee pain. Pt reports this pain began several years ago, however her most recent flare up was following a fall that occurred in June/ July of 2024 off of her bed. Pt well known to clinic and has had prior bouts of PT for same condition with known R knee OA. Pt reports 6-7/10 NPS in the medial aspect of the R knee at rest, and 9/10 NPS with activity. Pt's aggravating factors include prolonged walking, standing, and curb/ stair  negotiation. Pt describes her pain as sharp and is felt mostly with knee flexion. Massaging her R knee and taking Tylenol daily, temporarily help to relieve pt's R knee pain. Pt reports intermittent N/T down the RLE.   PAIN:  Are you having pain? Yes: NPRS scale: 8/10 Pain location: Medial aspect of the R knee  Pain description: Sharp Aggravating factors: Prolonged walking, standing up, walking up stairs/ curbs, knee flexion Relieving factors: massaging, Tylenol every day  9/10 R knee at  worst with activity  PRECAUTIONS: None  RED FLAGS: None   WEIGHT BEARING RESTRICTIONS: No  FALLS:  Has patient fallen in last 6 months? Yes. Number of falls 1 June or July, 2024   LIVING ENVIRONMENT: Has following equipment at home: Dan Humphreys - 2 wheeled, [employer]  OCCUPATION: Retired  PLOF: Independent  PATIENT GOALS: "Try to get the leg to bend more and not pop".  NEXT MD VISIT: N/A  OBJECTIVE:  Note: Objective measures were completed at Evaluation unless otherwise noted.  DIAGNOSTIC FINDINGS: N/A  PATIENT SURVEYS:  FOTO 59/72  COGNITION: Overall cognitive status: Within functional limits for tasks assessed     SENSATION: LQNS: WFL  EDEMA:  Bilat LE edema noted 2/2 to pt dx with lymphedema.   MUSCLE LENGTH: Hamstrings (straight leg): Right 28 deg; Left 46 deg  POSTURE: rounded shoulders  PALPATION: TTP at medial and lateral aspect of anterior R and L knee.   LOWER EXTREMITY ROM:  Active ROM Right eval Left eval  Hip flexion    Hip extension    Hip abduction    Hip adduction    Hip internal rotation    Hip external rotation    Knee flexion 40* 91*  Knee extension 0 0  Ankle dorsiflexion    Ankle plantarflexion    Ankle inversion    Ankle eversion     (Blank rows = not tested)  LOWER EXTREMITY MMT:  MMT Right eval Left eval  Hip flexion 4- 4-  Hip extension    Hip abduction (screened in sitting)  4 4  Hip adduction (screened in sitting) 4 4  Hip internal rotation 4- 4-  Hip external rotation 4- 4-  Knee flexion 4 4  Knee extension 4 4  Ankle dorsiflexion 4 4  Ankle plantarflexion    Ankle inversion    Ankle eversion     (Blank rows = not tested)  LOWER EXTREMITY SPECIAL TESTS:  Hip special tests: Luisa Hart (FABER) test: negative and modified FADDIR: negative Knee special tests: Lachman Test: negative   FUNCTIONAL TESTS:  5 times sit to stand: 19.85 seconds 10 meter walk test: 0.68m/s seconds  GAIT: Assistive device utilized: Quad  cane small base Level of assistance: Modified independence Comments: Pt notes decreased stance phase and knee flexion of the RLE, significant RLE eversion and abduction also noted with ambulation. Circumductory in RLE swing phase due to limited R knee flexion.   TODAY'S TREATMENT:  DATE: 01/14/23  There.ex:  Nu-Step L2 for 5 min for knee flexion mobility. BUE/BLE.  STS: 3x6. VC's for reducing L lateral lean compensation. Fair carryover. Hook lying R heel slide: 2x12  SLR on RLE: 3x6 with VC's for maintaining TKE Alternating marches with BUE support: 3x8/LE, SBA.  Standing hip abduction with BUE support: 2x8/side. VC's for upright posture. Fair carryover.  X100' working on gait sequencing with QC in LUE. Consistent 2 point pattern after VC's. Limited R knee flexion in swing phase despite VC's to correct.    PATIENT EDUCATION:  Education details: Pt educated on utilizing the quad cane in the LUE instead of the RUE to maximize support of AD, and verbalized and demonstrated understanding.  Person educated: Patient Education method: Medical illustrator Education comprehension: verbalized understanding and returned demonstration  HOME EXERCISE PROGRAM: Access Code: [email or web address] URL: [email or web address] Date: 01/14/2023 Prepared by: Ronnie Derby  Exercises - Sit to Stand Without Arm Support  - 1 x daily - 4-5 x weekly - 3 sets - 6 reps - Supine Heel Slide  - 1 x daily - 7 x weekly - 3 sets - 15 reps - Active Straight Leg Raise with Quad Set  - 1 x daily - 4-5 x weekly - 3 sets - 8 reps  ASSESSMENT:  CLINICAL IMPRESSION: Pt arriving for first treatment. Focus of session on developing HEP for home completion. Pt reports pain reduction to 7/10 NPS post exercise. Pt presenting with continued R knee flexion limitations, hip muscle weakness and  gait abnormality with decreased knee flexion in swing phase on RLE increasing falls risk. Educated pt on reps/sets/frequency of exercises to address these deficits. Pt understanding. Pt will benefit from skilled PT services to address these deficits to reduce pain and maximize functional capacity with walking, steps, and household ADL's.   OBJECTIVE IMPAIRMENTS: Abnormal gait, decreased balance, decreased knowledge of use of DME, decreased mobility, difficulty walking, decreased ROM, decreased strength, hypomobility, increased edema, impaired flexibility, and pain.   ACTIVITY LIMITATIONS: carrying, lifting, bending, standing, squatting, stairs, and transfers  PARTICIPATION LIMITATIONS: meal prep, cleaning, and community activity  PERSONAL FACTORS: Age, Time since onset of injury/illness/exacerbation, and 1-2 comorbidities: lymphedema, DM  are also affecting patient's functional outcome.   REHAB POTENTIAL: Fair multi- comorbidities  CLINICAL DECISION MAKING: Stable/uncomplicated  EVALUATION COMPLEXITY: Low   GOALS: Goals reviewed with patient? Yes  SHORT TERM GOALS: Target date: 01/20/23 Pt will be independent with HEP to improve R knee strength and decrease pain with functional activities  Baseline: 12/23/22: HEP deferred to next session.  Goal status: INITIAL  LONG TERM GOALS: Target date: 02/17/23  Pt will improve FOTO to target score to demonstrate clinically significant improvement in functional mobility.  Baseline: 12/23/22: 59/73 Goal status: INITIAL  2. Pt will report <7/10 pain in R knee w/ prolonged ambulation to display clinically significant pain improvement. Baseline: 12/23/22: 9/10 Goal status: INITIAL  3. Pt will improve 5xSTS to </= 11.4 seconds to display improvements in LE strength and equal age- matched norms.  Baseline: 12/23/22: 19.85 seconds Goal status: INITIAL  4. Pt will improve to >/= 1.0 m/s to display improvements in LE strength and functional  mobility to be categorized as a limited community ambulator.  Baseline: 12/23/22: 0.77m/s (household ambulator)  Goal status: INITIAL    PLAN:  PT FREQUENCY: 1-2x/week  PT DURATION: 8 weeks  PLANNED INTERVENTIONS: 97164- PT Re-evaluation, 97110-Therapeutic exercises, 97530- Therapeutic activity, [id number]- Neuromuscular re-education, 97535- Self Care, 16109- Manual therapy, [phone]- Gait training,  Patient/Family education, Balance training, Stair training, Joint mobilization, Joint manipulation, Cryotherapy, and Moist heat  PLAN FOR NEXT SESSION: knee flexion exercises, hip strengthening, gait training.    Delphia Grates. Fairly IV, PT, DPT Physical Therapist- [place]  [facility]  01/14/2023, 3:19 PM

## 2023-01-20 ENCOUNTER — Ambulatory Visit: Payer: 59

## 2023-01-20 DIAGNOSIS — M25561 Pain in right knee: Secondary | ICD-10-CM | POA: Diagnosis not present

## 2023-01-20 DIAGNOSIS — G8929 Other chronic pain: Secondary | ICD-10-CM

## 2023-01-20 DIAGNOSIS — M6281 Muscle weakness (generalized): Secondary | ICD-10-CM

## 2023-01-20 NOTE — Therapy (Signed)
OUTPATIENT PHYSICAL THERAPY LOWER EXTREMITY TREATMENT   Patient Name: Christine Lawson MRN: [id number] DOB:07-Jan-1961, 62 y.o., female Today's Date: 01/20/2023  END OF SESSION:  PT End of Session - 01/20/23 1515     Visit Number 3    Number of Visits 17    Date for PT Re-Evaluation 02/17/23    PT Start Time 1516    PT Stop Time 1600    PT Time Calculation (min) 44 min    Activity Tolerance Patient tolerated treatment well    Behavior During Therapy [facility] for tasks assessed/performed             Past Medical History:  Diagnosis Date   Diabetes mellitus without complication (HCC)    Endometrial adenocarcinoma (HCC)    GERD (gastroesophageal reflux disease)    Hematochezia    Hyperlipidemia    Hypertension    IDA (iron deficiency anemia)    Inguinal lymphadenopathy 07/13/2015   Irritable bowel syndrome    Obesity    Osteoarthritis    Sleep apnea    Past Surgical History:  Procedure Laterality Date   ABDOMINAL HYSTERECTOMY  feb 2013   CARPAL TUNNEL RELEASE Left 03/20/2021   Procedure: CARPAL TUNNEL RELEASE ENDOSCOPIC;  Surgeon: Christena Flake, MD;  Location: [facility] ORS;  Service: Orthopedics;  Laterality: Left;   COLONOSCOPY WITH PROPOFOL N/A 08/02/2015   Procedure: COLONOSCOPY WITH PROPOFOL;  Surgeon: Scot Jun, MD;  Location: [facility] ENDOSCOPY;  Service: Endoscopy;  Laterality: N/A;   ESOPHAGOGASTRODUODENOSCOPY (EGD) WITH PROPOFOL N/A 08/02/2015   Procedure: ESOPHAGOGASTRODUODENOSCOPY (EGD) WITH PROPOFOL;  Surgeon: Scot Jun, MD;  Location: [facility] ENDOSCOPY;  Service: Endoscopy;  Laterality: N/A;   Patient Active Problem List   Diagnosis Date Noted   Hepatic steatosis 05/21/2019   Other proteinuria 01/25/2019   Vitamin D deficiency 04/13/2018   Gastroesophageal reflux disease with esophagitis 01/22/2018   Irritable bowel syndrome with diarrhea 04/03/2016   Lymphedema 01/17/2016   Venous (peripheral) insufficiency 01/17/2016   Pain in limb 01/17/2016   Iron  deficiency anemia due to chronic blood loss 01/09/2016   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   History of endometrial cancer 08/01/2015   Endometrial adenocarcinoma (HCC) 07/13/2015    Class: History of   Inguinal lymphadenopathy 07/13/2015   Long term current use of insulin (HCC) 12/12/2013   Microalbuminuria 12/12/2013   Diabetes (HCC) 07/28/2013   BP (high blood pressure) 07/28/2013   HLD (hyperlipidemia) 07/28/2013   Adiposity 07/28/2013   Obstructive apnea 07/28/2013   Arthritis, degenerative 07/28/2013   Apnea, sleep 07/28/2013   Thyroid nodule 07/28/2013    PCP: Barbette Reichmann, MD  REFERRING PROVIDER: Barbette Reichmann, MD  REFERRING DIAG:  Acute Pain, R Knee    THERAPY DIAG:  Chronic pain of right knee  Muscle weakness (generalized)  Rationale for Evaluation and Treatment: Rehabilitation  ONSET DATE: Several years ago   SUBJECTIVE:   SUBJECTIVE STATEMENT: Pt reports 8/10 NPS R knee pain. Denies falls. States pain decreased to 7/10 after last session but it was temporary.  PERTINENT HISTORY: Pt is a 32 y/p F with chronic R knee pain. Pt reports this pain began several years ago, however her most recent flare up was following a fall that occurred in June/ July of 2024 off of her bed. Pt well known to clinic and has had prior bouts of PT for same condition with known R knee OA. Pt reports 6-7/10 NPS in the medial aspect of the R knee at rest, and 9/10 NPS with  activity. Pt's aggravating factors include prolonged walking, standing, and curb/ stair negotiation. Pt describes her pain as sharp and is felt mostly with knee flexion. Massaging her R knee and taking Tylenol daily, temporarily help to relieve pt's R knee pain. Pt reports intermittent N/T down the RLE.   PAIN:  Are you having pain? Yes: NPRS scale: 8/10 Pain location: Medial aspect of the R knee  Pain description: Sharp Aggravating factors: Prolonged walking, standing up, walking up stairs/ curbs, knee  flexion Relieving factors: massaging, Tylenol every day  9/10 R knee at worst with activity  PRECAUTIONS: None  RED FLAGS: None   WEIGHT BEARING RESTRICTIONS: No  FALLS:  Has patient fallen in last 6 months? Yes. Number of falls 1 June or July, 2024   LIVING ENVIRONMENT: Has following equipment at home: Dan Humphreys - 2 wheeled, [employer]  OCCUPATION: Retired  PLOF: Independent  PATIENT GOALS: "Try to get the leg to bend more and not pop".  NEXT MD VISIT: N/A  OBJECTIVE:  Note: Objective measures were completed at Evaluation unless otherwise noted.  DIAGNOSTIC FINDINGS: N/A  PATIENT SURVEYS:  FOTO 59/72  COGNITION: Overall cognitive status: Within functional limits for tasks assessed     SENSATION: LQNS: WFL  EDEMA:  Bilat LE edema noted 2/2 to pt dx with lymphedema.   MUSCLE LENGTH: Hamstrings (straight leg): Right 28 deg; Left 46 deg  POSTURE: rounded shoulders  PALPATION: TTP at medial and lateral aspect of anterior R and L knee.   LOWER EXTREMITY ROM:  Active ROM Right eval Left eval  Hip flexion    Hip extension    Hip abduction    Hip adduction    Hip internal rotation    Hip external rotation    Knee flexion 40* 91*  Knee extension 0 0  Ankle dorsiflexion    Ankle plantarflexion    Ankle inversion    Ankle eversion     (Blank rows = not tested)  LOWER EXTREMITY MMT:  MMT Right eval Left eval  Hip flexion 4- 4-  Hip extension    Hip abduction (screened in sitting)  4 4  Hip adduction (screened in sitting) 4 4  Hip internal rotation 4- 4-  Hip external rotation 4- 4-  Knee flexion 4 4  Knee extension 4 4  Ankle dorsiflexion 4 4  Ankle plantarflexion    Ankle inversion    Ankle eversion     (Blank rows = not tested)  LOWER EXTREMITY SPECIAL TESTS:  Hip special tests: Luisa Hart (FABER) test: negative and modified FADDIR: negative Knee special tests: Lachman Test: negative   FUNCTIONAL TESTS:  5 times sit to stand: 19.85 seconds 10  meter walk test: 0.86m/s seconds  GAIT: Assistive device utilized: Quad cane small base Level of assistance: Modified independence Comments: Pt notes decreased stance phase and knee flexion of the RLE, significant RLE eversion and abduction also noted with ambulation. Circumductory in RLE swing phase due to limited R knee flexion.   TODAY'S TREATMENT:  DATE: 01/20/23  There.ex:  Nu-Step L3 for 5 min for knee flexion mobility. BUE/BLE.  Mini squats: 3x6 with TC's for buttocks to tap airex pad in grey chair to encourage knee flexion and hip hinge.  Hook lying R heel slide: 2x12. Reaching 75 degrees knee flexion SLR on RLE: 3x6 with VC's for maintaining TKE Alternating marches with BUE support: 3x8/LE, 3# AW's SBA.  Standing hip abduction with BUE support: 3x8/side. 3# AW's VC's for upright posture. Good carryover.  Standing R knee flexor lunge stretch on step with BUE support: 2x10, 3 sec holds/rep.   PATIENT EDUCATION:  Education details: Pt educated on utilizing the quad cane in the LUE instead of the RUE to maximize support of AD, and verbalized and demonstrated understanding.  Person educated: Patient Education method: Medical illustrator Education comprehension: verbalized understanding and returned demonstration  HOME EXERCISE PROGRAM: Access Code: [email or web address] URL: [email or web address] Date: 01/14/2023 Prepared by: Ronnie Derby  Exercises - Sit to Stand Without Arm Support  - 1 x daily - 4-5 x weekly - 3 sets - 6 reps - Supine Heel Slide  - 1 x daily - 7 x weekly - 3 sets - 15 reps - Active Straight Leg Raise with Quad Set  - 1 x daily - 4-5 x weekly - 3 sets - 8 reps  ASSESSMENT:  CLINICAL IMPRESSION: Continuing POC with focus on knee flexion exercises and proximal hip strengthening and knee strengthening. Pt has already  vastly improved her knee flexion AROM from 40 to 75 degrees but remains limited in knee flexion with squatting reliant on hip ER compensation to assist and also hip circumduction in gait. Future sessions to continue to focus on knee flexion, normalized gait mechanics with LRAD, and general knee and hip strengthening exercises. Pt will benefit from skilled PT services to address these deficits to reduce pain and maximize functional capacity with walking, steps, and household ADL's.   OBJECTIVE IMPAIRMENTS: Abnormal gait, decreased balance, decreased knowledge of use of DME, decreased mobility, difficulty walking, decreased ROM, decreased strength, hypomobility, increased edema, impaired flexibility, and pain.   ACTIVITY LIMITATIONS: carrying, lifting, bending, standing, squatting, stairs, and transfers  PARTICIPATION LIMITATIONS: meal prep, cleaning, and community activity  PERSONAL FACTORS: Age, Time since onset of injury/illness/exacerbation, and 1-2 comorbidities: lymphedema, DM  are also affecting patient's functional outcome.   REHAB POTENTIAL: Fair multi- comorbidities  CLINICAL DECISION MAKING: Stable/uncomplicated  EVALUATION COMPLEXITY: Low   GOALS: Goals reviewed with patient? Yes  SHORT TERM GOALS: Target date: 01/20/23 Pt will be independent with HEP to improve R knee strength and decrease pain with functional activities  Baseline: 12/23/22: HEP deferred to next session.  Goal status: INITIAL  LONG TERM GOALS: Target date: 02/17/23  Pt will improve FOTO to target score to demonstrate clinically significant improvement in functional mobility.  Baseline: 12/23/22: 59/73 Goal status: INITIAL  2. Pt will report <7/10 pain in R knee w/ prolonged ambulation to display clinically significant pain improvement. Baseline: 12/23/22: 9/10 Goal status: INITIAL  3. Pt will improve 5xSTS to </= 11.4 seconds to display improvements in LE strength and equal age- matched norms.   Baseline: 12/23/22: 19.85 seconds Goal status: INITIAL  4. Pt will improve to >/= 1.0 m/s to display improvements in LE strength and functional mobility to be categorized as a limited community ambulator.  Baseline: 12/23/22: 0.46m/s (household ambulator)  Goal status: INITIAL    PLAN:  PT FREQUENCY: 1-2x/week  PT DURATION: 8 weeks  PLANNED INTERVENTIONS: 16109- PT Re-evaluation,  97110-Therapeutic exercises, 97530- Therapeutic activity, [id number]- Neuromuscular re-education, 97535- Self Care, 40981- Manual therapy, [phone]- Gait training, Patient/Family education, Balance training, Stair training, Joint mobilization, Joint manipulation, Cryotherapy, and Moist heat  PLAN FOR NEXT SESSION: knee flexion exercises, hip strengthening, gait training.    Delphia Grates. Fairly IV, PT, DPT Physical Therapist- [place]  [facility]  01/20/2023, 4:34 PM

## 2023-01-22 ENCOUNTER — Ambulatory Visit: Payer: 59

## 2023-01-22 DIAGNOSIS — M6281 Muscle weakness (generalized): Secondary | ICD-10-CM

## 2023-01-22 DIAGNOSIS — M25561 Pain in right knee: Secondary | ICD-10-CM | POA: Diagnosis not present

## 2023-01-22 DIAGNOSIS — G8929 Other chronic pain: Secondary | ICD-10-CM

## 2023-01-22 NOTE — Therapy (Signed)
OUTPATIENT PHYSICAL THERAPY LOWER EXTREMITY TREATMENT   Patient Name: Christine Lawson MRN: [id number] DOB:1961/01/02, 62 y.o., female Today's Date: 01/23/2023  END OF SESSION:  PT End of Session - 01/22/23 1613     Visit Number 4    Number of Visits 17    Date for PT Re-Evaluation 02/17/23    PT Start Time 1612    PT Stop Time 1645    PT Time Calculation (min) 33 min    Activity Tolerance Patient tolerated treatment well    Behavior During Therapy [facility] for tasks assessed/performed             Past Medical History:  Diagnosis Date   Diabetes mellitus without complication (HCC)    Endometrial adenocarcinoma (HCC)    GERD (gastroesophageal reflux disease)    Hematochezia    Hyperlipidemia    Hypertension    IDA (iron deficiency anemia)    Inguinal lymphadenopathy 07/13/2015   Irritable bowel syndrome    Obesity    Osteoarthritis    Sleep apnea    Past Surgical History:  Procedure Laterality Date   ABDOMINAL HYSTERECTOMY  feb 2013   CARPAL TUNNEL RELEASE Left 03/20/2021   Procedure: CARPAL TUNNEL RELEASE ENDOSCOPIC;  Surgeon: Christena Flake, MD;  Location: [facility] ORS;  Service: Orthopedics;  Laterality: Left;   COLONOSCOPY WITH PROPOFOL N/A 08/02/2015   Procedure: COLONOSCOPY WITH PROPOFOL;  Surgeon: Scot Jun, MD;  Location: [facility] ENDOSCOPY;  Service: Endoscopy;  Laterality: N/A;   ESOPHAGOGASTRODUODENOSCOPY (EGD) WITH PROPOFOL N/A 08/02/2015   Procedure: ESOPHAGOGASTRODUODENOSCOPY (EGD) WITH PROPOFOL;  Surgeon: Scot Jun, MD;  Location: [facility] ENDOSCOPY;  Service: Endoscopy;  Laterality: N/A;   Patient Active Problem List   Diagnosis Date Noted   Hepatic steatosis 05/21/2019   Other proteinuria 01/25/2019   Vitamin D deficiency 04/13/2018   Gastroesophageal reflux disease with esophagitis 01/22/2018   Irritable bowel syndrome with diarrhea 04/03/2016   Lymphedema 01/17/2016   Venous (peripheral) insufficiency 01/17/2016   Pain in limb 01/17/2016   Iron  deficiency anemia due to chronic blood loss 01/09/2016   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   History of endometrial cancer 08/01/2015   Endometrial adenocarcinoma (HCC) 07/13/2015    Class: History of   Inguinal lymphadenopathy 07/13/2015   Long term current use of insulin (HCC) 12/12/2013   Microalbuminuria 12/12/2013   Diabetes (HCC) 07/28/2013   BP (high blood pressure) 07/28/2013   HLD (hyperlipidemia) 07/28/2013   Adiposity 07/28/2013   Obstructive apnea 07/28/2013   Arthritis, degenerative 07/28/2013   Apnea, sleep 07/28/2013   Thyroid nodule 07/28/2013    PCP: Barbette Reichmann, MD  REFERRING PROVIDER: Barbette Reichmann, MD  REFERRING DIAG:  Acute Pain, R Knee    THERAPY DIAG:  Chronic pain of right knee  Muscle weakness (generalized)  Rationale for Evaluation and Treatment: Rehabilitation  ONSET DATE: Several years ago   SUBJECTIVE:   SUBJECTIVE STATEMENT: Pt reports 8/10 NPS R knee pain. Has been walking a lot today running errands.   PERTINENT HISTORY: Pt is a 68 y/p F with chronic R knee pain. Pt reports this pain began several years ago, however her most recent flare up was following a fall that occurred in June/ July of 2024 off of her bed. Pt well known to clinic and has had prior bouts of PT for same condition with known R knee OA. Pt reports 6-7/10 NPS in the medial aspect of the R knee at rest, and 9/10 NPS with activity. Pt's aggravating factors include  prolonged walking, standing, and curb/ stair negotiation. Pt describes her pain as sharp and is felt mostly with knee flexion. Massaging her R knee and taking Tylenol daily, temporarily help to relieve pt's R knee pain. Pt reports intermittent N/T down the RLE.   PAIN:  Are you having pain? Yes: NPRS scale: 8/10 Pain location: Medial aspect of the R knee  Pain description: Sharp Aggravating factors: Prolonged walking, standing up, walking up stairs/ curbs, knee flexion Relieving factors: massaging,  Tylenol every day  9/10 R knee at worst with activity  PRECAUTIONS: None  RED FLAGS: None   WEIGHT BEARING RESTRICTIONS: No  FALLS:  Has patient fallen in last 6 months? Yes. Number of falls 1 June or July, 2024   LIVING ENVIRONMENT: Has following equipment at home: Dan Humphreys - 2 wheeled, [employer]  OCCUPATION: Retired  PLOF: Independent  PATIENT GOALS: "Try to get the leg to bend more and not pop".  NEXT MD VISIT: N/A  OBJECTIVE:  Note: Objective measures were completed at Evaluation unless otherwise noted.  DIAGNOSTIC FINDINGS: N/A  PATIENT SURVEYS:  FOTO 59/72  COGNITION: Overall cognitive status: Within functional limits for tasks assessed     SENSATION: LQNS: WFL  EDEMA:  Bilat LE edema noted 2/2 to pt dx with lymphedema.   MUSCLE LENGTH: Hamstrings (straight leg): Right 28 deg; Left 46 deg  POSTURE: rounded shoulders  PALPATION: TTP at medial and lateral aspect of anterior R and L knee.   LOWER EXTREMITY ROM:  Active ROM Right eval Left eval  Hip flexion    Hip extension    Hip abduction    Hip adduction    Hip internal rotation    Hip external rotation    Knee flexion 40* 91*  Knee extension 0 0  Ankle dorsiflexion    Ankle plantarflexion    Ankle inversion    Ankle eversion     (Blank rows = not tested)  LOWER EXTREMITY MMT:  MMT Right eval Left eval  Hip flexion 4- 4-  Hip extension    Hip abduction (screened in sitting)  4 4  Hip adduction (screened in sitting) 4 4  Hip internal rotation 4- 4-  Hip external rotation 4- 4-  Knee flexion 4 4  Knee extension 4 4  Ankle dorsiflexion 4 4  Ankle plantarflexion    Ankle inversion    Ankle eversion     (Blank rows = not tested)  LOWER EXTREMITY SPECIAL TESTS:  Hip special tests: Luisa Hart (FABER) test: negative and modified FADDIR: negative Knee special tests: Lachman Test: negative   FUNCTIONAL TESTS:  5 times sit to stand: 19.85 seconds 10 meter walk test: 0.75m/s  seconds  GAIT: Assistive device utilized: Quad cane small base Level of assistance: Modified independence Comments: Pt notes decreased stance phase and knee flexion of the RLE, significant RLE eversion and abduction also noted with ambulation. Circumductory in RLE swing phase due to limited R knee flexion.   TODAY'S TREATMENT:  DATE: 01/22/23  There.ex:  Seated R knee flexion AAROM on basketball. 2x12  Mini squats: 3x6 with TC's for buttocks to tap airex pad in grey chair to encourage knee flexion and hip hinge.  Alternating marches with BUE support: 3x8/LE, 3# AW's SBA.   Standing hip abduction with BUE support: 3x8/side. 3# AW's VC's for upright posture. Good carryover.   Gait 2x100' with focus on R knee flexion in swing phase. VC's for heel strike in initial contact. Seated rest b/t bouts. Good carryover after cuing.  Seated R knee LAQ with 7.5# AW: 2x12. VC's for upright posture and improving eccentric control.  PATIENT EDUCATION:  Education details: Pt educated on utilizing the quad cane in the LUE instead of the RUE to maximize support of AD, and verbalized and demonstrated understanding.  Person educated: Patient Education method: Medical illustrator Education comprehension: verbalized understanding and returned demonstration  HOME EXERCISE PROGRAM: Access Code: [email or web address] URL: [email or web address] Date: 01/14/2023 Prepared by: Ronnie Derby  Exercises - Sit to Stand Without Arm Support  - 1 x daily - 4-5 x weekly - 3 sets - 6 reps - Supine Heel Slide  - 1 x daily - 7 x weekly - 3 sets - 15 reps - Active Straight Leg Raise with Quad Set  - 1 x daily - 4-5 x weekly - 3 sets - 8 reps  ASSESSMENT:  CLINICAL IMPRESSION: Session limited today due to pt being late to appointment. Continuing with improving R knee flexion AROM  due to notable decreased flexion at eval, along with progressive LE strength. Heavy focus on hip and quad strength to improve R knee pain in the setting of OA. Continuing to progress normalized gait mechanics also with focus on knee flexion in swing phase and heel strike at initial contact to reduce falls risk but also to improve knee AROM and stability with stance phases when progressing LLE in swing phase. Pt understanding of all exercises with good ability to safely complete. Pt continues to have high pain levels with WB'ing activities and limitations in ROM. Pt will benefit from skilled PT services to address these deficits to reduce pain and maximize functional capacity with walking, steps, and household ADL's.   OBJECTIVE IMPAIRMENTS: Abnormal gait, decreased balance, decreased knowledge of use of DME, decreased mobility, difficulty walking, decreased ROM, decreased strength, hypomobility, increased edema, impaired flexibility, and pain.   ACTIVITY LIMITATIONS: carrying, lifting, bending, standing, squatting, stairs, and transfers  PARTICIPATION LIMITATIONS: meal prep, cleaning, and community activity  PERSONAL FACTORS: Age, Time since onset of injury/illness/exacerbation, and 1-2 comorbidities: lymphedema, DM  are also affecting patient's functional outcome.   REHAB POTENTIAL: Fair multi- comorbidities  CLINICAL DECISION MAKING: Stable/uncomplicated  EVALUATION COMPLEXITY: Low   GOALS: Goals reviewed with patient? Yes  SHORT TERM GOALS: Target date: 01/20/23 Pt will be independent with HEP to improve R knee strength and decrease pain with functional activities  Baseline: 12/23/22: HEP deferred to next session.  Goal status: INITIAL  LONG TERM GOALS: Target date: 02/17/23  Pt will improve FOTO to target score to demonstrate clinically significant improvement in functional mobility.  Baseline: 12/23/22: 59/73 Goal status: INITIAL  2. Pt will report <7/10 pain in R knee w/  prolonged ambulation to display clinically significant pain improvement. Baseline: 12/23/22: 9/10 Goal status: INITIAL  3. Pt will improve 5xSTS to </= 11.4 seconds to display improvements in LE strength and equal age- matched norms.  Baseline: 12/23/22: 19.85 seconds Goal status: INITIAL  4. Pt will improve to >/=  1.0 m/s to display improvements in LE strength and functional mobility to be categorized as a limited community ambulator.  Baseline: 12/23/22: 0.70m/s (household ambulator)  Goal status: INITIAL    PLAN:  PT FREQUENCY: 1-2x/week  PT DURATION: 8 weeks  PLANNED INTERVENTIONS: 97164- PT Re-evaluation, 97110-Therapeutic exercises, 97530- Therapeutic activity, 97112- Neuromuscular re-education, 97535- Self Care, 16109- Manual therapy, [phone]- Gait training, Patient/Family education, Balance training, Stair training, Joint mobilization, Joint manipulation, Cryotherapy, and Moist heat  PLAN FOR NEXT SESSION: knee flexion exercises, hip strengthening, gait training.    Delphia Grates. Fairly IV, PT, DPT Physical Therapist- [place]  [facility]  01/23/2023, 8:09 AM

## 2023-01-27 ENCOUNTER — Ambulatory Visit: Payer: 59

## 2023-02-03 ENCOUNTER — Encounter: Payer: 59 | Admitting: Physical Therapy

## 2023-02-03 ENCOUNTER — Ambulatory Visit: Payer: 59 | Attending: Internal Medicine

## 2023-02-03 DIAGNOSIS — M25561 Pain in right knee: Secondary | ICD-10-CM | POA: Diagnosis present

## 2023-02-03 DIAGNOSIS — M6281 Muscle weakness (generalized): Secondary | ICD-10-CM | POA: Diagnosis present

## 2023-02-03 DIAGNOSIS — G8929 Other chronic pain: Secondary | ICD-10-CM | POA: Diagnosis present

## 2023-02-03 DIAGNOSIS — M25612 Stiffness of left shoulder, not elsewhere classified: Secondary | ICD-10-CM

## 2023-02-03 DIAGNOSIS — M25512 Pain in left shoulder: Secondary | ICD-10-CM | POA: Insufficient documentation

## 2023-02-03 NOTE — Therapy (Signed)
OUTPATIENT PHYSICAL THERAPY TREATMENT   Patient Name: Christine Lawson MRN: [id number] DOB:[birth year], 62 y.o., female Today's Date: 02/03/2023  END OF SESSION:  PT End of Session - 02/03/23 1524     Visit Number 5    Number of Visits 17    Date for PT Re-Evaluation 02/17/23    Authorization Type [employer] Medicare    PT Start Time 1515    PT Stop Time 1555    PT Time Calculation (min) 40 min    Activity Tolerance Patient tolerated treatment well    Behavior During Therapy WFL for tasks assessed/performed             Past Medical History:  Diagnosis Date   Diabetes mellitus without complication (HCC)    Endometrial adenocarcinoma (HCC)    GERD (gastroesophageal reflux disease)    Hematochezia    Hyperlipidemia    Hypertension    IDA (iron deficiency anemia)    Inguinal lymphadenopathy 07/13/2015   Irritable bowel syndrome    Obesity    Osteoarthritis    Sleep apnea    Past Surgical History:  Procedure Laterality Date   ABDOMINAL HYSTERECTOMY  feb 2013   CARPAL TUNNEL RELEASE Left 03/20/2021   Procedure: CARPAL TUNNEL RELEASE ENDOSCOPIC;  Surgeon: Christena Flake, MD;  Location: [facility] ORS;  Service: Orthopedics;  Laterality: Left;   COLONOSCOPY WITH PROPOFOL N/A 08/02/2015   Procedure: COLONOSCOPY WITH PROPOFOL;  Surgeon: Scot Jun, MD;  Location: [facility] ENDOSCOPY;  Service: Endoscopy;  Laterality: N/A;   ESOPHAGOGASTRODUODENOSCOPY (EGD) WITH PROPOFOL N/A 08/02/2015   Procedure: ESOPHAGOGASTRODUODENOSCOPY (EGD) WITH PROPOFOL;  Surgeon: Scot Jun, MD;  Location: [facility] ENDOSCOPY;  Service: Endoscopy;  Laterality: N/A;   Patient Active Problem List   Diagnosis Date Noted   Hepatic steatosis 05/21/2019   Other proteinuria 01/25/2019   Vitamin D deficiency 04/13/2018   Gastroesophageal reflux disease with esophagitis 01/22/2018   Irritable bowel syndrome with diarrhea 04/03/2016   Lymphedema 01/17/2016   Venous (peripheral) insufficiency 01/17/2016   Pain in limb  01/17/2016   Iron deficiency anemia due to chronic blood loss 01/09/2016   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   History of endometrial cancer 08/01/2015   Endometrial adenocarcinoma (HCC) 07/13/2015    Class: History of   Inguinal lymphadenopathy 07/13/2015   Long term current use of insulin (HCC) 12/12/2013   Microalbuminuria 12/12/2013   Diabetes (HCC) 07/28/2013   BP (high blood pressure) 07/28/2013   HLD (hyperlipidemia) 07/28/2013   Adiposity 07/28/2013   Obstructive apnea 07/28/2013   Arthritis, degenerative 07/28/2013   Apnea, sleep 07/28/2013   Thyroid nodule 07/28/2013    PCP: Barbette Reichmann, MD  REFERRING PROVIDER: Barbette Reichmann, MD  REFERRING DIAG:  Acute Pain, R Knee    THERAPY DIAG:  Chronic pain of right knee  Muscle weakness (generalized)  Chronic left shoulder pain  Stiffness of left shoulder, not elsewhere classified  Rationale for Evaluation and Treatment: Rehabilitation  ONSET DATE: Several years ago   SUBJECTIVE:   SUBJECTIVE STATEMENT: No updates since last session. Working on [employer]. Pain is bad today, was active in community out at the store, could not obtain an electric cart, hence used buggy to walk and got tired. Still using nice on knee at home for pain.    PERTINENT HISTORY: Pt is a 64 y/p F with chronic R knee pain. Pt reports this pain began several years ago, however her most recent flare up was following a fall that occurred in June/ July of 2024  off of her bed. Pt well known to clinic and has had prior bouts of PT for same condition with known R knee OA. Pt reports 6-7/10 NPS in the medial aspect of the R knee at rest, and 9/10 NPS with activity. Pt's aggravating factors include prolonged walking, standing, and curb/ stair negotiation. Pt describes her pain as sharp and is felt mostly with knee flexion. Massaging her R knee and taking Tylenol daily, temporarily help to relieve pt's R knee pain. Pt reports intermittent N/T down the RLE.    PAIN:  Are you having pain? 9/10 knee pain   PRECAUTIONS: None  RED FLAGS: None   WEIGHT BEARING RESTRICTIONS: No  FALLS:  Has patient fallen in last 6 months? Yes. Number of falls 1 June or July, 2024   LIVING ENVIRONMENT: Has following equipment at home: Dan Humphreys - 2 wheeled, [employer]  OCCUPATION: Retired  PLOF: Independent  PATIENT GOALS: "Try to get the leg to bend more and not pop".  NEXT MD VISIT: N/A  OBJECTIVE:  Note: Objective measures were completed at Evaluation unless otherwise noted.  DIAGNOSTIC FINDINGS: N/A  PATIENT SURVEYS:  FOTO 59/72  COGNITION: Overall cognitive status: Within functional limits for tasks assessed     SENSATION: LQNS: WFL  EDEMA:  Bilat LE edema noted 2/2 to pt dx with lymphedema.   MUSCLE LENGTH: Hamstrings (straight leg): Right 28 deg; Left 46 deg  POSTURE: rounded shoulders  PALPATION: TTP at medial and lateral aspect of anterior R and L knee.   LOWER EXTREMITY ROM:  Active ROM Right eval Left eval  Hip flexion    Hip extension    Hip abduction    Hip adduction    Hip internal rotation    Hip external rotation    Knee flexion 40* 91*  Knee extension 0 0  Ankle dorsiflexion    Ankle plantarflexion    Ankle inversion    Ankle eversion     (Blank rows = not tested)  LOWER EXTREMITY MMT:  MMT Right eval Left eval  Hip flexion 4- 4-  Hip extension    Hip abduction (screened in sitting)  4 4  Hip adduction (screened in sitting) 4 4  Hip internal rotation 4- 4-  Hip external rotation 4- 4-  Knee flexion 4 4  Knee extension 4 4  Ankle dorsiflexion 4 4  Ankle plantarflexion    Ankle inversion    Ankle eversion     (Blank rows = not tested)  LOWER EXTREMITY SPECIAL TESTS:  Hip special tests: Luisa Hart (FABER) test: negative and modified FADDIR: negative Knee special tests: Lachman Test: negative   FUNCTIONAL TESTS:  5 times sit to stand: 19.85 seconds 10 meter walk test: 0.28m/s  seconds  GAIT: Assistive device utilized: Quad cane small base Level of assistance: Modified independence Comments: Pt notes decreased stance phase and knee flexion of the RLE, significant RLE eversion and abduction also noted with ambulation. Circumductory in RLE swing phase due to limited R knee flexion.   TODAY'S TREATMENT:  DATE: 01/22/23 -Nustep level 2: seat at 8 x2 minutes, seat at 6 x 2 minutes, seat at 5 x 2 minutes -STS from elevated Nustep seat 1x15, 1x10 (22.5 inch height, hands ad lib)  -seated marching 1x20, alternate pattern 1x12  -seated Rt foot on B-ball AAROM knee flexion 1x12  -lateral side stepping at support bar 1x15 3lb AW x 90 second  -seated flexion heel slides on slider (furniture) 3lb AW 1x15 -seated Rt ankle DF 3lb 1x15  -seated Rt SAQ in [employer] chair 0lb 1x15  -seated Rt ankle DF 3lb 1x15  -lateral side stepping at support bar 1x15 3lb AW x 90 second  -bilat heel raise x10 (only has partial range, asked to use support bar to advance possible height)     PATIENT EDUCATION:  Education details: Pt educated on utilizing the quad cane in the LUE instead of the RUE to maximize support of AD, and verbalized and demonstrated understanding.  Person educated: Patient Education method: Medical illustrator Education comprehension: verbalized understanding and returned demonstration  HOME EXERCISE PROGRAM: Access Code: [email or web address] URL: [email or web address] Date: 01/14/2023 Prepared by: Ronnie Derby  Exercises - Sit to Stand Without Arm Support  - 1 x daily - 4-5 x weekly - 3 sets - 6 reps - Supine Heel Slide  - 1 x daily - 7 x weekly - 3 sets - 15 reps - Active Straight Leg Raise with Quad Set  - 1 x daily - 4-5 x weekly - 3 sets - 8 reps  ASSESSMENT:  CLINICAL IMPRESSION: Continued to work on progressive ROM in Rt  knee and advanced load volume using both open chain and AMB based exercises. Rt knee flexion ROM remains grossly over 80 degrees. Pain is high on arrival and exit, but motivation and capacity to participate remain promising. No change in HEP assignments this date. Pt will benefit from skilled PT services to address these deficits to reduce pain and maximize functional capacity with walking, steps, and household ADL's.   OBJECTIVE IMPAIRMENTS: Abnormal gait, decreased balance, decreased knowledge of use of DME, decreased mobility, difficulty walking, decreased ROM, decreased strength, hypomobility, increased edema, impaired flexibility, and pain.   ACTIVITY LIMITATIONS: carrying, lifting, bending, standing, squatting, stairs, and transfers  PARTICIPATION LIMITATIONS: meal prep, cleaning, and community activity  PERSONAL FACTORS: Age, Time since onset of injury/illness/exacerbation, and 1-2 comorbidities: lymphedema, DM  are also affecting patient's functional outcome.   REHAB POTENTIAL: Fair multi- comorbidities  CLINICAL DECISION MAKING: Stable/uncomplicated  EVALUATION COMPLEXITY: Low   GOALS: Goals reviewed with patient? Yes  SHORT TERM GOALS: Target date: 01/20/23 Pt will be independent with HEP to improve R knee strength and decrease pain with functional activities  Baseline: 12/23/22: HEP deferred to next session.  Goal status: INITIAL  LONG TERM GOALS: Target date: 02/17/23  Pt will improve FOTO to target score to demonstrate clinically significant improvement in functional mobility.  Baseline: 12/23/22: 59/73 Goal status: INITIAL  2. Pt will report <7/10 pain in R knee w/ prolonged ambulation to display clinically significant pain improvement. Baseline: 12/23/22: 9/10 Goal status: INITIAL  3. Pt will improve 5xSTS to </= 11.4 seconds to display improvements in LE strength and equal age- matched norms.  Baseline: 12/23/22: 19.85 seconds Goal status: INITIAL  4. Pt will  improve to >/= 1.0 m/s to display improvements in LE strength and functional mobility to be categorized as a limited community ambulator.  Baseline: 12/23/22: 0.15m/s (household ambulator)  Goal status: INITIAL    PLAN:  PT  FREQUENCY: 1-2x/week  PT DURATION: 8 weeks  PLANNED INTERVENTIONS: 16109- PT Re-evaluation, 97110-Therapeutic exercises, 97530- Therapeutic activity, 97112- Neuromuscular re-education, 97535- Self Care, 60454- Manual therapy, [phone]- Gait training, Patient/Family education, Balance training, Stair training, Joint mobilization, Joint manipulation, Cryotherapy, and Moist heat  PLAN FOR NEXT SESSION: knee flexion exercises, hip strengthening, gait training.    3:27 PM, 02/03/23 Rosamaria Lints, PT, DPT Physical Therapist - [place] [phone] (Office)

## 2023-02-05 ENCOUNTER — Ambulatory Visit: Payer: 59

## 2023-02-05 DIAGNOSIS — M6281 Muscle weakness (generalized): Secondary | ICD-10-CM

## 2023-02-05 DIAGNOSIS — M25561 Pain in right knee: Secondary | ICD-10-CM | POA: Diagnosis not present

## 2023-02-05 DIAGNOSIS — G8929 Other chronic pain: Secondary | ICD-10-CM

## 2023-02-05 NOTE — Therapy (Signed)
OUTPATIENT PHYSICAL THERAPY TREATMENT   Patient Name: Christine Lawson MRN: [id number] DOB:1960-11-20, 62 y.o., female Today's Date: 02/05/2023  END OF SESSION:  PT End of Session - 02/05/23 1525     Visit Number 6    Number of Visits 17    Date for PT Re-Evaluation 02/17/23    Authorization Type UHC Medicare    PT Start Time 1520    PT Stop Time 1600    PT Time Calculation (min) 40 min    Activity Tolerance Patient tolerated treatment well    Behavior During Therapy WFL for tasks assessed/performed             Past Medical History:  Diagnosis Date   Diabetes mellitus without complication (HCC)    Endometrial adenocarcinoma (HCC)    GERD (gastroesophageal reflux disease)    Hematochezia    Hyperlipidemia    Hypertension    IDA (iron deficiency anemia)    Inguinal lymphadenopathy 07/13/2015   Irritable bowel syndrome    Obesity    Osteoarthritis    Sleep apnea    Past Surgical History:  Procedure Laterality Date   ABDOMINAL HYSTERECTOMY  feb 2013   CARPAL TUNNEL RELEASE Left 03/20/2021   Procedure: CARPAL TUNNEL RELEASE ENDOSCOPIC;  Surgeon: Christena Flake, MD;  Location: [facility] ORS;  Service: Orthopedics;  Laterality: Left;   COLONOSCOPY WITH PROPOFOL N/A 08/02/2015   Procedure: COLONOSCOPY WITH PROPOFOL;  Surgeon: Scot Jun, MD;  Location: [facility] ENDOSCOPY;  Service: Endoscopy;  Laterality: N/A;   ESOPHAGOGASTRODUODENOSCOPY (EGD) WITH PROPOFOL N/A 08/02/2015   Procedure: ESOPHAGOGASTRODUODENOSCOPY (EGD) WITH PROPOFOL;  Surgeon: Scot Jun, MD;  Location: [facility] ENDOSCOPY;  Service: Endoscopy;  Laterality: N/A;   Patient Active Problem List   Diagnosis Date Noted   Hepatic steatosis 05/21/2019   Other proteinuria 01/25/2019   Vitamin D deficiency 04/13/2018   Gastroesophageal reflux disease with esophagitis 01/22/2018   Irritable bowel syndrome with diarrhea 04/03/2016   Lymphedema 01/17/2016   Venous (peripheral) insufficiency 01/17/2016   Pain in limb  01/17/2016   Iron deficiency anemia due to chronic blood loss 01/09/2016   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   History of endometrial cancer 08/01/2015   Endometrial adenocarcinoma (HCC) 07/13/2015    Class: History of   Inguinal lymphadenopathy 07/13/2015   Long term current use of insulin (HCC) 12/12/2013   Microalbuminuria 12/12/2013   Diabetes (HCC) 07/28/2013   BP (high blood pressure) 07/28/2013   HLD (hyperlipidemia) 07/28/2013   Adiposity 07/28/2013   Obstructive apnea 07/28/2013   Arthritis, degenerative 07/28/2013   Apnea, sleep 07/28/2013   Thyroid nodule 07/28/2013    PCP: Barbette Reichmann, MD  REFERRING PROVIDER: Barbette Reichmann, MD  REFERRING DIAG:  Acute Pain, R Knee    THERAPY DIAG:  Chronic pain of right knee  Muscle weakness (generalized)  Rationale for Evaluation and Treatment: Rehabilitation  ONSET DATE: Several years ago   SUBJECTIVE:   SUBJECTIVE STATEMENT: Pt reports walking a lot at [employer] leading to a lot of pain. Pain currently 7/10 NPS.   PERTINENT HISTORY: Pt is a 89 y/p F with chronic R knee pain. Pt reports this pain began several years ago, however her most recent flare up was following a fall that occurred in June/ July of 2024 off of her bed. Pt well known to clinic and has had prior bouts of PT for same condition with known R knee OA. Pt reports 6-7/10 NPS in the medial aspect of the R knee at rest, and 9/10  NPS with activity. Pt's aggravating factors include prolonged walking, standing, and curb/ stair negotiation. Pt describes her pain as sharp and is felt mostly with knee flexion. Massaging her R knee and taking Tylenol daily, temporarily help to relieve pt's R knee pain. Pt reports intermittent N/T down the RLE.   PAIN:  Are you having pain? 9/10 knee pain   PRECAUTIONS: None  RED FLAGS: None   WEIGHT BEARING RESTRICTIONS: No  FALLS:  Has patient fallen in last 6 months? Yes. Number of falls 1 June or July, 2024    LIVING ENVIRONMENT: Has following equipment at home: Dan Humphreys - 2 wheeled, [employer]  OCCUPATION: Retired  PLOF: Independent  PATIENT GOALS: "Try to get the leg to bend more and not pop".  NEXT MD VISIT: N/A  OBJECTIVE:  Note: Objective measures were completed at Evaluation unless otherwise noted.  DIAGNOSTIC FINDINGS: N/A  PATIENT SURVEYS:  FOTO 59/72  COGNITION: Overall cognitive status: Within functional limits for tasks assessed     SENSATION: LQNS: WFL  EDEMA:  Bilat LE edema noted 2/2 to pt dx with lymphedema.   MUSCLE LENGTH: Hamstrings (straight leg): Right 28 deg; Left 46 deg  POSTURE: rounded shoulders  PALPATION: TTP at medial and lateral aspect of anterior R and L knee.   LOWER EXTREMITY ROM:  Active ROM Right eval Left eval  Hip flexion    Hip extension    Hip abduction    Hip adduction    Hip internal rotation    Hip external rotation    Knee flexion 40* 91*  Knee extension 0 0  Ankle dorsiflexion    Ankle plantarflexion    Ankle inversion    Ankle eversion     (Blank rows = not tested)  LOWER EXTREMITY MMT:  MMT Right eval Left eval  Hip flexion 4- 4-  Hip extension    Hip abduction (screened in sitting)  4 4  Hip adduction (screened in sitting) 4 4  Hip internal rotation 4- 4-  Hip external rotation 4- 4-  Knee flexion 4 4  Knee extension 4 4  Ankle dorsiflexion 4 4  Ankle plantarflexion    Ankle inversion    Ankle eversion     (Blank rows = not tested)  LOWER EXTREMITY SPECIAL TESTS:  Hip special tests: Luisa Hart (FABER) test: negative and modified FADDIR: negative Knee special tests: Lachman Test: negative   FUNCTIONAL TESTS:  5 times sit to stand: 19.85 seconds 10 meter walk test: 0.21m/s seconds  GAIT: Assistive device utilized: Quad cane small base Level of assistance: Modified independence Comments: Pt notes decreased stance phase and knee flexion of the RLE, significant RLE eversion and abduction also noted with  ambulation. Circumductory in RLE swing phase due to limited R knee flexion.   TODAY'S TREATMENT:  DATE: 02/05/23 Nu-step level 3: seat at 7 for 4 minutes, seat at 6 x 2 minutes Seated R foot on B-ball AAROM knee flexion 1x12  STS from elevated mat table, x10 (22" height), 2x10 (21" height).  Hook lying R knee SAQ: 3# AW, 3x8 RLE SLR with 3# AW: 3x6 Standing heel raises with BUE support: 2x15 Seated R ankle DF with 3# AW: 2x15  Seated R knee flexion AROM: 85 degrees  PATIENT EDUCATION:  Education details: Pt educated on utilizing the quad cane in the LUE instead of the RUE to maximize support of AD, and verbalized and demonstrated understanding.  Person educated: Patient Education method: Medical illustrator Education comprehension: verbalized understanding and returned demonstration  HOME EXERCISE PROGRAM: Access Code: [email or web address] URL: [email or web address] Date: 01/14/2023 Prepared by: Ronnie Derby  Exercises - Sit to Stand Without Arm Support  - 1 x daily - 4-5 x weekly - 3 sets - 6 reps - Supine Heel Slide  - 1 x daily - 7 x weekly - 3 sets - 15 reps - Active Straight Leg Raise with Quad Set  - 1 x daily - 4-5 x weekly - 3 sets - 8 reps  ASSESSMENT:  CLINICAL IMPRESSION: Continuing POC with further R knee flexion AROM exercise progressions and gross LE strengthening. Pt progressing in overall exercise volume and addition of resistance. Pt does overall demonstrate 85 degrees of knee flexion which is greatly improved from 40 degrees at eval with reduced pain levels at 6/10 NPS at end of session. Pt still remains with moderate knee pain, gait abnormality, AROM deficits and LE weakness leading to decreased function and ability to complete pain free ADL's and walking tasks. Pt will benefit from skilled PT services to address these deficits  to reduce pain and maximize functional capacity with walking, steps, and household ADL's.   OBJECTIVE IMPAIRMENTS: Abnormal gait, decreased balance, decreased knowledge of use of DME, decreased mobility, difficulty walking, decreased ROM, decreased strength, hypomobility, increased edema, impaired flexibility, and pain.   ACTIVITY LIMITATIONS: carrying, lifting, bending, standing, squatting, stairs, and transfers  PARTICIPATION LIMITATIONS: meal prep, cleaning, and community activity  PERSONAL FACTORS: Age, Time since onset of injury/illness/exacerbation, and 1-2 comorbidities: lymphedema, DM  are also affecting patient's functional outcome.   REHAB POTENTIAL: Fair multi- comorbidities  CLINICAL DECISION MAKING: Stable/uncomplicated  EVALUATION COMPLEXITY: Low   GOALS: Goals reviewed with patient? Yes  SHORT TERM GOALS: Target date: 01/20/23 Pt will be independent with HEP to improve R knee strength and decrease pain with functional activities  Baseline: 12/23/22: HEP deferred to next session.  Goal status: INITIAL  LONG TERM GOALS: Target date: 02/17/23  Pt will improve FOTO to target score to demonstrate clinically significant improvement in functional mobility.  Baseline: 12/23/22: 59/73 Goal status: INITIAL  2. Pt will report <7/10 pain in R knee w/ prolonged ambulation to display clinically significant pain improvement. Baseline: 12/23/22: 9/10 Goal status: INITIAL  3. Pt will improve 5xSTS to </= 11.4 seconds to display improvements in LE strength and equal age- matched norms.  Baseline: 12/23/22: 19.85 seconds Goal status: INITIAL  4. Pt will improve to >/= 1.0 m/s to display improvements in LE strength and functional mobility to be categorized as a limited community ambulator.  Baseline: 12/23/22: 0.12m/s (household ambulator)  Goal status: INITIAL    PLAN:  PT FREQUENCY: 1-2x/week  PT DURATION: 8 weeks  PLANNED INTERVENTIONS: 97164- PT Re-evaluation,  97110-Therapeutic exercises, 97530- Therapeutic activity, [id number]- Neuromuscular re-education, 97535- Self Care, 78295- Manual therapy,  29562- Gait training, Patient/Family education, Balance training, Stair training, Joint mobilization, Joint manipulation, Cryotherapy, and Moist heat  PLAN FOR NEXT SESSION: knee flexion exercises, hip strengthening, gait training.   Delphia Grates. Fairly IV, PT, DPT Physical Therapist- [place]  [facility]  4:04 PM, 02/05/23

## 2023-02-10 ENCOUNTER — Ambulatory Visit: Payer: 59

## 2023-02-10 DIAGNOSIS — M25561 Pain in right knee: Secondary | ICD-10-CM | POA: Diagnosis not present

## 2023-02-10 DIAGNOSIS — M6281 Muscle weakness (generalized): Secondary | ICD-10-CM

## 2023-02-10 DIAGNOSIS — G8929 Other chronic pain: Secondary | ICD-10-CM

## 2023-02-10 NOTE — Therapy (Signed)
OUTPATIENT PHYSICAL THERAPY TREATMENT   Patient Name: Christine Lawson MRN: [id number] DOB:12-03-1960, 62 y.o., female Today's Date: 02/10/2023  END OF SESSION:  PT End of Session - 02/10/23 1527     Visit Number 7    Number of Visits 17    Date for PT Re-Evaluation 02/17/23    Authorization Type [employer] Medicare    PT Start Time 1528    PT Stop Time 1600    PT Time Calculation (min) 32 min    Activity Tolerance Patient tolerated treatment well    Behavior During Therapy WFL for tasks assessed/performed             Past Medical History:  Diagnosis Date   Diabetes mellitus without complication (HCC)    Endometrial adenocarcinoma (HCC)    GERD (gastroesophageal reflux disease)    Hematochezia    Hyperlipidemia    Hypertension    IDA (iron deficiency anemia)    Inguinal lymphadenopathy 07/13/2015   Irritable bowel syndrome    Obesity    Osteoarthritis    Sleep apnea    Past Surgical History:  Procedure Laterality Date   ABDOMINAL HYSTERECTOMY  feb 2013   CARPAL TUNNEL RELEASE Left 03/20/2021   Procedure: CARPAL TUNNEL RELEASE ENDOSCOPIC;  Surgeon: Christena Flake, MD;  Location: [facility] ORS;  Service: Orthopedics;  Laterality: Left;   COLONOSCOPY WITH PROPOFOL N/A 08/02/2015   Procedure: COLONOSCOPY WITH PROPOFOL;  Surgeon: Scot Jun, MD;  Location: [facility] ENDOSCOPY;  Service: Endoscopy;  Laterality: N/A;   ESOPHAGOGASTRODUODENOSCOPY (EGD) WITH PROPOFOL N/A 08/02/2015   Procedure: ESOPHAGOGASTRODUODENOSCOPY (EGD) WITH PROPOFOL;  Surgeon: Scot Jun, MD;  Location: [facility] ENDOSCOPY;  Service: Endoscopy;  Laterality: N/A;   Patient Active Problem List   Diagnosis Date Noted   Hepatic steatosis 05/21/2019   Other proteinuria 01/25/2019   Vitamin D deficiency 04/13/2018   Gastroesophageal reflux disease with esophagitis 01/22/2018   Irritable bowel syndrome with diarrhea 04/03/2016   Lymphedema 01/17/2016   Venous (peripheral) insufficiency 01/17/2016   Pain in limb  01/17/2016   Iron deficiency anemia due to chronic blood loss 01/09/2016   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   History of endometrial cancer 08/01/2015   Endometrial adenocarcinoma (HCC) 07/13/2015    Class: History of   Inguinal lymphadenopathy 07/13/2015   Long term current use of insulin (HCC) 12/12/2013   Microalbuminuria 12/12/2013   Diabetes (HCC) 07/28/2013   BP (high blood pressure) 07/28/2013   HLD (hyperlipidemia) 07/28/2013   Adiposity 07/28/2013   Obstructive apnea 07/28/2013   Arthritis, degenerative 07/28/2013   Apnea, sleep 07/28/2013   Thyroid nodule 07/28/2013    PCP: Barbette Reichmann, MD  REFERRING PROVIDER: Barbette Reichmann, MD  REFERRING DIAG:  Acute Pain, R Knee    THERAPY DIAG:  Chronic pain of right knee  Muscle weakness (generalized)  Rationale for Evaluation and Treatment: Rehabilitation  ONSET DATE: Several years ago   SUBJECTIVE:   SUBJECTIVE STATEMENT: Pt reports 8/10 pain NPS due to walking around [employer] for groceries. Had stomach bug over the weekend with her knee pain generally being 7/10 NPS.   PERTINENT HISTORY: Pt is a 74 y/p F with chronic R knee pain. Pt reports this pain began several years ago, however her most recent flare up was following a fall that occurred in June/ July of 2024 off of her bed. Pt well known to clinic and has had prior bouts of PT for same condition with known R knee OA. Pt reports 6-7/10 NPS in the medial  aspect of the R knee at rest, and 9/10 NPS with activity. Pt's aggravating factors include prolonged walking, standing, and curb/ stair negotiation. Pt describes her pain as sharp and is felt mostly with knee flexion. Massaging her R knee and taking Tylenol daily, temporarily help to relieve pt's R knee pain. Pt reports intermittent N/T down the RLE.   PAIN:  Are you having pain? 8/10 knee pain   PRECAUTIONS: None  RED FLAGS: None   WEIGHT BEARING RESTRICTIONS: No  FALLS:  Has patient fallen in  last 6 months? Yes. Number of falls 1 June or July, 2024   LIVING ENVIRONMENT: Has following equipment at home: Dan Humphreys - 2 wheeled, [employer]  OCCUPATION: Retired  PLOF: Independent  PATIENT GOALS: "Try to get the leg to bend more and not pop".  NEXT MD VISIT: N/A  OBJECTIVE:  Note: Objective measures were completed at Evaluation unless otherwise noted.  DIAGNOSTIC FINDINGS: N/A  PATIENT SURVEYS:  FOTO 59/72  COGNITION: Overall cognitive status: Within functional limits for tasks assessed     SENSATION: LQNS: WFL  EDEMA:  Bilat LE edema noted 2/2 to pt dx with lymphedema.   MUSCLE LENGTH: Hamstrings (straight leg): Right 28 deg; Left 46 deg  POSTURE: rounded shoulders  PALPATION: TTP at medial and lateral aspect of anterior R and L knee.   LOWER EXTREMITY ROM:  Active ROM Right eval Left eval  Hip flexion    Hip extension    Hip abduction    Hip adduction    Hip internal rotation    Hip external rotation    Knee flexion 40* 91*  Knee extension 0 0  Ankle dorsiflexion    Ankle plantarflexion    Ankle inversion    Ankle eversion     (Blank rows = not tested)  LOWER EXTREMITY MMT:  MMT Right eval Left eval  Hip flexion 4- 4-  Hip extension    Hip abduction (screened in sitting)  4 4  Hip adduction (screened in sitting) 4 4  Hip internal rotation 4- 4-  Hip external rotation 4- 4-  Knee flexion 4 4  Knee extension 4 4  Ankle dorsiflexion 4 4  Ankle plantarflexion    Ankle inversion    Ankle eversion     (Blank rows = not tested)  LOWER EXTREMITY SPECIAL TESTS:  Hip special tests: Luisa Hart (FABER) test: negative and modified FADDIR: negative Knee special tests: Lachman Test: negative   FUNCTIONAL TESTS:  5 times sit to stand: 19.85 seconds 10 meter walk test: 0.53m/s seconds  GAIT: Assistive device utilized: Quad cane small base Level of assistance: Modified independence Comments: Pt notes decreased stance phase and knee flexion of the RLE,  significant RLE eversion and abduction also noted with ambulation. Circumductory in RLE swing phase due to limited R knee flexion.   TODAY'S TREATMENT:  DATE: 02/10/23  Seated R foot on B-ball AAROM knee flexion 2x15  TRX squats to standard height chair+ airex pad: 2x12 Standing heel to toe raises with BUE support. x20, VC's to prevent posterior lean.  Standing R/L hip flexion with 5# AW over top R foot: 2x8/LE  Seated R knee LAQ with 5# AW: x15    Seated R knee flexion AROM: 90 degrees  PATIENT EDUCATION:  Education details: Pt educated on utilizing the quad cane in the LUE instead of the RUE to maximize support of AD, and verbalized and demonstrated understanding.  Person educated: Patient Education method: Medical illustrator Education comprehension: verbalized understanding and returned demonstration  HOME EXERCISE PROGRAM: Access Code: [email or web address] URL: [email or web address] Date: 01/14/2023 Prepared by: Ronnie Derby  Exercises - Sit to Stand Without Arm Support  - 1 x daily - 4-5 x weekly - 3 sets - 6 reps - Supine Heel Slide  - 1 x daily - 7 x weekly - 3 sets - 15 reps - Active Straight Leg Raise with Quad Set  - 1 x daily - 4-5 x weekly - 3 sets - 8 reps  ASSESSMENT:  CLINICAL IMPRESSION: Session limited as pt late for appointment. Continuing with R knee flexion AROM and CKC and [place] hip and knee strengthening exercises incorporating compound movements incorporating motor patterns for gait mechanics. Pt tolerating session well reaching 90 degrees flexion at end of session but still requires regular VC's for knee flexion in swing phase of gait. Pt also continues to have limited standing endurance/strength needing seated rest breaks regularly. Pt will benefit from skilled PT services to address these deficits to reduce pain and  maximize functional capacity with walking, steps, and household ADL's.   OBJECTIVE IMPAIRMENTS: Abnormal gait, decreased balance, decreased knowledge of use of DME, decreased mobility, difficulty walking, decreased ROM, decreased strength, hypomobility, increased edema, impaired flexibility, and pain.   ACTIVITY LIMITATIONS: carrying, lifting, bending, standing, squatting, stairs, and transfers  PARTICIPATION LIMITATIONS: meal prep, cleaning, and community activity  PERSONAL FACTORS: Age, Time since onset of injury/illness/exacerbation, and 1-2 comorbidities: lymphedema, DM  are also affecting patient's functional outcome.   REHAB POTENTIAL: Fair multi- comorbidities  CLINICAL DECISION MAKING: Stable/uncomplicated  EVALUATION COMPLEXITY: Low   GOALS: Goals reviewed with patient? Yes  SHORT TERM GOALS: Target date: 01/20/23 Pt will be independent with HEP to improve R knee strength and decrease pain with functional activities  Baseline: 12/23/22: HEP deferred to next session.  Goal status: INITIAL  LONG TERM GOALS: Target date: 02/17/23  Pt will improve FOTO to target score to demonstrate clinically significant improvement in functional mobility.  Baseline: 12/23/22: 59/73 Goal status: INITIAL  2. Pt will report <7/10 pain in R knee w/ prolonged ambulation to display clinically significant pain improvement. Baseline: 12/23/22: 9/10 Goal status: INITIAL  3. Pt will improve 5xSTS to </= 11.4 seconds to display improvements in LE strength and equal age- matched norms.  Baseline: 12/23/22: 19.85 seconds Goal status: INITIAL  4. Pt will improve to >/= 1.0 m/s to display improvements in LE strength and functional mobility to be categorized as a limited community ambulator.  Baseline: 12/23/22: 0.53m/s (household ambulator)  Goal status: INITIAL    PLAN:  PT FREQUENCY: 1-2x/week  PT DURATION: 8 weeks  PLANNED INTERVENTIONS: 97164- PT Re-evaluation, 97110-Therapeutic  exercises, 97530- Therapeutic activity, 97112- Neuromuscular re-education, 97535- Self Care, 78295- Manual therapy, [phone]- Gait training, Patient/Family education, Balance training, Stair training, Joint mobilization, Joint manipulation, Cryotherapy, and Moist heat  PLAN FOR  NEXT SESSION: knee flexion exercises, hip strengthening, gait training.   Delphia Grates. Fairly IV, PT, DPT Physical Therapist- [place]  [facility]  4:16 PM, 02/10/23

## 2023-02-12 ENCOUNTER — Ambulatory Visit: Payer: 59

## 2023-02-17 ENCOUNTER — Ambulatory Visit: Payer: 59

## 2023-02-17 DIAGNOSIS — M6281 Muscle weakness (generalized): Secondary | ICD-10-CM

## 2023-02-17 DIAGNOSIS — G8929 Other chronic pain: Secondary | ICD-10-CM

## 2023-02-17 DIAGNOSIS — M25561 Pain in right knee: Secondary | ICD-10-CM | POA: Diagnosis not present

## 2023-02-17 NOTE — Therapy (Signed)
OUTPATIENT PHYSICAL THERAPY TREATMENT/RECERT   Patient Name: Christine Lawson MRN: [id number] DOB:01/12/1961, 62 y.o., female Today's Date: 02/17/2023  END OF SESSION:  PT End of Session - 02/17/23 1435     Visit Number 8    Number of Visits 17    Date for PT Re-Evaluation 04/14/23    Authorization Type [employer]HC Medicare    PT Start Time 1431    PT Stop Time 1510    PT Time Calculation (min) 39 min    Activity Tolerance Patient tolerated treatment well    Behavior During Therapy WFL for tasks assessed/performed             Past Medical History:  Diagnosis Date   Diabetes mellitus without complication (HCC)    Endometrial adenocarcinoma (HCC)    GERD (gastroesophageal reflux disease)    Hematochezia    Hyperlipidemia    Hypertension    IDA (iron deficiency anemia)    Inguinal lymphadenopathy 07/13/2015   Irritable bowel syndrome    Obesity    Osteoarthritis    Sleep apnea    Past Surgical History:  Procedure Laterality Date   ABDOMINAL HYSTERECTOMY  feb 2013   CARPAL TUNNEL RELEASE Left 03/20/2021   Procedure: CARPAL TUNNEL RELEASE ENDOSCOPIC;  Surgeon: Christena Flake, MD;  Location: [facility] ORS;  Service: Orthopedics;  Laterality: Left;   COLONOSCOPY WITH PROPOFOL N/A 08/02/2015   Procedure: COLONOSCOPY WITH PROPOFOL;  Surgeon: Scot Jun, MD;  Location: [facility] ENDOSCOPY;  Service: Endoscopy;  Laterality: N/A;   ESOPHAGOGASTRODUODENOSCOPY (EGD) WITH PROPOFOL N/A 08/02/2015   Procedure: ESOPHAGOGASTRODUODENOSCOPY (EGD) WITH PROPOFOL;  Surgeon: Scot Jun, MD;  Location: [facility] ENDOSCOPY;  Service: Endoscopy;  Laterality: N/A;   Patient Active Problem List   Diagnosis Date Noted   Hepatic steatosis 05/21/2019   Other proteinuria 01/25/2019   Vitamin D deficiency 04/13/2018   Gastroesophageal reflux disease with esophagitis 01/22/2018   Irritable bowel syndrome with diarrhea 04/03/2016   Lymphedema 01/17/2016   Venous (peripheral) insufficiency 01/17/2016   Pain in  limb 01/17/2016   Iron deficiency anemia due to chronic blood loss 01/09/2016   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   History of endometrial cancer 08/01/2015   Endometrial adenocarcinoma (HCC) 07/13/2015    Class: History of   Inguinal lymphadenopathy 07/13/2015   Long term current use of insulin (HCC) 12/12/2013   Microalbuminuria 12/12/2013   Diabetes (HCC) 07/28/2013   BP (high blood pressure) 07/28/2013   HLD (hyperlipidemia) 07/28/2013   Adiposity 07/28/2013   Obstructive apnea 07/28/2013   Arthritis, degenerative 07/28/2013   Apnea, sleep 07/28/2013   Thyroid nodule 07/28/2013    PCP: Barbette Reichmann, MD  REFERRING PROVIDER: Barbette Reichmann, MD  REFERRING DIAG:  Acute Pain, R Knee    THERAPY DIAG:  Chronic pain of right knee  Muscle weakness (generalized)  Rationale for Evaluation and Treatment: Rehabilitation  ONSET DATE: Several years ago   SUBJECTIVE:   SUBJECTIVE STATEMENT: Pt reports 8/10 pain NPS. Otherwise doing well. Feels like her R knee is stiff with gait. Limited swing phase noted with RLE circumduction compensation.  PERTINENT HISTORY: Pt is a 70 y/p F with chronic R knee pain. Pt reports this pain began several years ago, however her most recent flare up was following a fall that occurred in June/ July of 2024 off of her bed. Pt well known to clinic and has had prior bouts of PT for same condition with known R knee OA. Pt reports 6-7/10 NPS in the medial aspect of  the R knee at rest, and 9/10 NPS with activity. Pt's aggravating factors include prolonged walking, standing, and curb/ stair negotiation. Pt describes her pain as sharp and is felt mostly with knee flexion. Massaging her R knee and taking Tylenol daily, temporarily help to relieve pt's R knee pain. Pt reports intermittent N/T down the RLE.   PAIN:  Are you having pain? 8/10 knee pain   PRECAUTIONS: None  RED FLAGS: None   WEIGHT BEARING RESTRICTIONS: No  FALLS:  Has patient  fallen in last 6 months? Yes. Number of falls 1 June or July, 2024   LIVING ENVIRONMENT: Has following equipment at home: Dan Humphreys - 2 wheeled, [employer]  OCCUPATION: Retired  PLOF: Independent  PATIENT GOALS: "Try to get the leg to bend more and not pop".  NEXT MD VISIT: N/A  OBJECTIVE:  Note: Objective measures were completed at Evaluation unless otherwise noted.  DIAGNOSTIC FINDINGS: N/A  PATIENT SURVEYS:  FOTO 59/72  COGNITION: Overall cognitive status: Within functional limits for tasks assessed     SENSATION: LQNS: WFL  EDEMA:  Bilat LE edema noted 2/2 to pt dx with lymphedema.   MUSCLE LENGTH: Hamstrings (straight leg): Right 28 deg; Left 46 deg  POSTURE: rounded shoulders  PALPATION: TTP at medial and lateral aspect of anterior R and L knee.   LOWER EXTREMITY ROM:  Active ROM Right eval Left eval Left  02/17/23  Hip flexion     Hip extension     Hip abduction     Hip adduction     Hip internal rotation     Hip external rotation     Knee flexion 40* 91* 95  Knee extension 0 0 0  Ankle dorsiflexion     Ankle plantarflexion     Ankle inversion     Ankle eversion      (Blank rows = not tested)  LOWER EXTREMITY MMT:  MMT Right eval Left eval  Hip flexion 4- 4-  Hip extension    Hip abduction (screened in sitting)  4 4  Hip adduction (screened in sitting) 4 4  Hip internal rotation 4- 4-  Hip external rotation 4- 4-  Knee flexion 4 4  Knee extension 4 4  Ankle dorsiflexion 4 4  Ankle plantarflexion    Ankle inversion    Ankle eversion     (Blank rows = not tested)  LOWER EXTREMITY SPECIAL TESTS:  Hip special tests: Luisa Hart (FABER) test: negative and modified FADDIR: negative Knee special tests: Lachman Test: negative   FUNCTIONAL TESTS:  5 times sit to stand: 19.85 seconds 10 meter walk test: 0.69m/s seconds  GAIT: Assistive device utilized: Quad cane small base Level of assistance: Modified independence Comments: Pt notes decreased  stance phase and knee flexion of the RLE, significant RLE eversion and abduction also noted with ambulation. Circumductory in RLE swing phase due to limited R knee flexion.   TODAY'S TREATMENT:  DATE: 02/17/23  There.ex: Nu Step L3 seat 6 for 3 min. Seat 5 for 2 min for improving R knee flexion AROM.   Reassessment of goals as pt at end of POC. See clinical impression and goals for details.   Updated HEP. Min to mod multimodal cuing for form/technique.  Included hip abduction. Performed 2x8/LE  Mini squats: x8   Seated heel to toe raises. X8/direction  Knee AROM: 0-95 deg   PATIENT EDUCATION:  Education details: Pt educated on utilizing the quad cane in the LUE instead of the RUE to maximize support of AD, and verbalized and demonstrated understanding.  Person educated: Patient Education method: Medical illustrator Education comprehension: verbalized understanding and returned demonstration  HOME EXERCISE PROGRAM: Access Code: [email or web address] URL: [email or web address] Date: 02/17/2023 Prepared by: Ronnie Derby  Exercises - Supine Heel Slide  - 1 x daily - 7 x weekly - 3 sets - 15 reps - Active Straight Leg Raise with Quad Set  - 1 x daily - 4-5 x weekly - 3 sets - 8 reps - Mini Squat with Counter Support  - 1 x daily - 4-5 x weekly - 3 sets - 6 reps - Standing Hip Abduction with Counter Support  - 1 x daily - 4-5 x weekly - 3 sets - 8 reps - Seated Heel Toe Raises  - 1 x daily - 4-5 x weekly - 3 sets - 8 reps  Access Code: [email or web address] URL: [email or web address] Date: 01/14/2023 Prepared by: Ronnie Derby  Exercises - Sit to Stand Without Arm Support  - 1 x daily - 4-5 x weekly - 3 sets - 6 reps - Supine Heel Slide  - 1 x daily - 7 x weekly - 3 sets - 15 reps - Active Straight Leg Raise with Quad Set  - 1 x daily - 4-5 x  weekly - 3 sets - 8 reps  ASSESSMENT:  CLINICAL IMPRESSION: Pt at end of POC warranting re-certification. Pt generally has regressed in her goals but this is likely due to recent illness and worsening knee pain due to weather. Pt has regressed with regards to gait speed and FOTO with grossly similar pain levels with standing and walking indicative of no change with functional mobility. However, pt has significantly improved in her 5xSTS and also her knee flexion AROM from 40 degrees to 95 degrees. Pt did have ~ 2 weeks off from this POC due to transportation barriers. Although pt has generally regressed with some of her goals and functional mobility, I anticipate pt will make greater progress continuing PT POC with improved health and  compliance with adequate transportation. Pt remains greatly impacted with severe pain with walking community distances and prolonged standing and remains with limitations in knee flexion AROM. Pt will benefit from skilled PT services to address these deficits to reduce pain and maximize functional capacity with walking, steps, and household ADL's.   OBJECTIVE IMPAIRMENTS: Abnormal gait, decreased balance, decreased knowledge of use of DME, decreased mobility, difficulty walking, decreased ROM, decreased strength, hypomobility, increased edema, impaired flexibility, and pain.   ACTIVITY LIMITATIONS: carrying, lifting, bending, standing, squatting, stairs, and transfers  PARTICIPATION LIMITATIONS: meal prep, cleaning, and community activity  PERSONAL FACTORS: Age, Time since onset of injury/illness/exacerbation, and 1-2 comorbidities: lymphedema, DM  are also affecting patient's functional outcome.   REHAB POTENTIAL: Fair multi- comorbidities  CLINICAL DECISION MAKING: Stable/uncomplicated  EVALUATION COMPLEXITY: Low   GOALS: Goals reviewed with patient? Yes  SHORT TERM GOALS: Target date: 01/20/23 Pt will be  independent with HEP to improve R knee strength and  decrease pain with functional activities  Baseline: 12/23/22: HEP deferred to next session.; 02/17/23: 100% compliance.  Goal status: MET  LONG TERM GOALS: Target date: 04/13/22  Pt will improve FOTO to target score to demonstrate clinically significant improvement in functional mobility.  Baseline: 12/23/22: 59/73; 02/17/23: 46/73 Goal status: ON GOING  2. Pt will report <7/10 pain in R knee w/ prolonged ambulation to display clinically significant pain improvement. Baseline: 12/23/22: 9/10; 02/17/23: 8/10 NPS with walking Goal status: ON GOING  3. Pt will improve 5xSTS to </= 11.4 seconds to display improvements in LE strength and equal age- matched norms.  Baseline: 12/23/22: 19.85 seconds; 02/17/23: 13.93 seconds Goal status: ON GOING  4. Pt will improve to >/= 1.0 m/s to display improvements in LE strength and functional mobility to be categorized as a limited community ambulator.  Baseline: 12/23/22: 0.39m/s (household ambulator);  02/17/23: .43 m/s Goal status: ON GOING     PLAN:  PT FREQUENCY: 1-2x/week  PT DURATION: 8 weeks  PLANNED INTERVENTIONS: 97164- PT Re-evaluation, 97110-Therapeutic exercises, 97530- Therapeutic activity, 97112- Neuromuscular re-education, 97535- Self Care, 40981- Manual therapy, [phone]- Gait training, Patient/Family education, Balance training, Stair training, Joint mobilization, Joint manipulation, Cryotherapy, and Moist heat  PLAN FOR NEXT SESSION: knee flexion exercises, hip strengthening, gait training.   Delphia Grates. Fairly IV, PT, DPT Physical Therapist- [place]  [facility]  3:12 PM, 02/17/23

## 2023-02-19 ENCOUNTER — Ambulatory Visit: Payer: 59

## 2023-02-19 DIAGNOSIS — M6281 Muscle weakness (generalized): Secondary | ICD-10-CM

## 2023-02-19 DIAGNOSIS — M25561 Pain in right knee: Secondary | ICD-10-CM | POA: Diagnosis not present

## 2023-02-19 DIAGNOSIS — G8929 Other chronic pain: Secondary | ICD-10-CM

## 2023-02-19 NOTE — Therapy (Signed)
OUTPATIENT PHYSICAL THERAPY TREATMENT   Patient Name: Christine Lawson MRN: [id number] DOB:September 10, 1960, 62 y.o., female Today's Date: 02/19/2023  END OF SESSION:  PT End of Session - 02/19/23 1441     Visit Number 9    Number of Visits 17    Date for PT Re-Evaluation 04/14/23    Authorization Type [employer] Medicare    Authorization - Visit Number 9    PT Start Time 1435    PT Stop Time 1515    PT Time Calculation (min) 40 min    Activity Tolerance Patient tolerated treatment well    Behavior During Therapy WFL for tasks assessed/performed             Past Medical History:  Diagnosis Date   Diabetes mellitus without complication (HCC)    Endometrial adenocarcinoma (HCC)    GERD (gastroesophageal reflux disease)    Hematochezia    Hyperlipidemia    Hypertension    IDA (iron deficiency anemia)    Inguinal lymphadenopathy 07/13/2015   Irritable bowel syndrome    Obesity    Osteoarthritis    Sleep apnea    Past Surgical History:  Procedure Laterality Date   ABDOMINAL HYSTERECTOMY  feb 2013   CARPAL TUNNEL RELEASE Left 03/20/2021   Procedure: CARPAL TUNNEL RELEASE ENDOSCOPIC;  Surgeon: Christena Flake, MD;  Location: [facility] ORS;  Service: Orthopedics;  Laterality: Left;   COLONOSCOPY WITH PROPOFOL N/A 08/02/2015   Procedure: COLONOSCOPY WITH PROPOFOL;  Surgeon: Scot Jun, MD;  Location: [facility] ENDOSCOPY;  Service: Endoscopy;  Laterality: N/A;   ESOPHAGOGASTRODUODENOSCOPY (EGD) WITH PROPOFOL N/A 08/02/2015   Procedure: ESOPHAGOGASTRODUODENOSCOPY (EGD) WITH PROPOFOL;  Surgeon: Scot Jun, MD;  Location: [facility] ENDOSCOPY;  Service: Endoscopy;  Laterality: N/A;   Patient Active Problem List   Diagnosis Date Noted   Hepatic steatosis 05/21/2019   Other proteinuria 01/25/2019   Vitamin D deficiency 04/13/2018   Gastroesophageal reflux disease with esophagitis 01/22/2018   Irritable bowel syndrome with diarrhea 04/03/2016   Lymphedema 01/17/2016   Venous (peripheral)  insufficiency 01/17/2016   Pain in limb 01/17/2016   Iron deficiency anemia due to chronic blood loss 01/09/2016   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   History of endometrial cancer 08/01/2015   Endometrial adenocarcinoma (HCC) 07/13/2015    Class: History of   Inguinal lymphadenopathy 07/13/2015   Long term current use of insulin (HCC) 12/12/2013   Microalbuminuria 12/12/2013   Diabetes (HCC) 07/28/2013   BP (high blood pressure) 07/28/2013   HLD (hyperlipidemia) 07/28/2013   Adiposity 07/28/2013   Obstructive apnea 07/28/2013   Arthritis, degenerative 07/28/2013   Apnea, sleep 07/28/2013   Thyroid nodule 07/28/2013    PCP: Barbette Reichmann, MD  REFERRING PROVIDER: Barbette Reichmann, MD  REFERRING DIAG:  Acute Pain, R Knee    THERAPY DIAG:  Chronic pain of right knee  Muscle weakness (generalized)  Rationale for Evaluation and Treatment: Rehabilitation  ONSET DATE: Several years ago   SUBJECTIVE:   SUBJECTIVE STATEMENT: Pt reports 8/10 pain NPS. Had near fall with her RLE "giving out" on her. Denies any serious injury besides minor bruising on her L hand catching herself with the wall.    PERTINENT HISTORY: Pt is a 38 y/p F with chronic R knee pain. Pt reports this pain began several years ago, however her most recent flare up was following a fall that occurred in June/ July of 2024 off of her bed. Pt well known to clinic and has had prior bouts of PT for  same condition with known R knee OA. Pt reports 6-7/10 NPS in the medial aspect of the R knee at rest, and 9/10 NPS with activity. Pt's aggravating factors include prolonged walking, standing, and curb/ stair negotiation. Pt describes her pain as sharp and is felt mostly with knee flexion. Massaging her R knee and taking Tylenol daily, temporarily help to relieve pt's R knee pain. Pt reports intermittent N/T down the RLE.   PAIN:  Are you having pain? 8/10 knee pain   PRECAUTIONS: None  RED FLAGS: None   WEIGHT  BEARING RESTRICTIONS: No  FALLS:  Has patient fallen in last 6 months? Yes. Number of falls 1 June or July, 2024   LIVING ENVIRONMENT: Has following equipment at home: Dan Humphreys - 2 wheeled, [employer]  OCCUPATION: Retired  PLOF: Independent  PATIENT GOALS: "Try to get the leg to bend more and not pop".  NEXT MD VISIT: N/A  OBJECTIVE:  Note: Objective measures were completed at Evaluation unless otherwise noted.  DIAGNOSTIC FINDINGS: N/A  PATIENT SURVEYS:  FOTO 59/72  COGNITION: Overall cognitive status: Within functional limits for tasks assessed     SENSATION: LQNS: WFL  EDEMA:  Bilat LE edema noted 2/2 to pt dx with lymphedema.   MUSCLE LENGTH: Hamstrings (straight leg): Right 28 deg; Left 46 deg  POSTURE: rounded shoulders  PALPATION: TTP at medial and lateral aspect of anterior R and L knee.   LOWER EXTREMITY ROM:  Active ROM Right eval Left eval Left  02/17/23  Hip flexion     Hip extension     Hip abduction     Hip adduction     Hip internal rotation     Hip external rotation     Knee flexion 40* 91* 95  Knee extension 0 0 0  Ankle dorsiflexion     Ankle plantarflexion     Ankle inversion     Ankle eversion      (Blank rows = not tested)  LOWER EXTREMITY MMT:  MMT Right eval Left eval  Hip flexion 4- 4-  Hip extension    Hip abduction (screened in sitting)  4 4  Hip adduction (screened in sitting) 4 4  Hip internal rotation 4- 4-  Hip external rotation 4- 4-  Knee flexion 4 4  Knee extension 4 4  Ankle dorsiflexion 4 4  Ankle plantarflexion    Ankle inversion    Ankle eversion     (Blank rows = not tested)  LOWER EXTREMITY SPECIAL TESTS:  Hip special tests: Luisa Hart (FABER) test: negative and modified FADDIR: negative Knee special tests: Lachman Test: negative   FUNCTIONAL TESTS:  5 times sit to stand: 19.85 seconds 10 meter walk test: 0.45m/s seconds  GAIT: Assistive device utilized: Quad cane small base Level of assistance:  Modified independence Comments: Pt notes decreased stance phase and knee flexion of the RLE, significant RLE eversion and abduction also noted with ambulation. Circumductory in RLE swing phase due to limited R knee flexion.   TODAY'S TREATMENT:  DATE: 02/19/23  There.ex: Nu Step L3 seat 6 for 3 min. Seat 5 for 2 min for improving R knee flexion AROM.   TRX squats to chair + airex pad: 3x8  Standing hip abduction with 2# AW's: 3x8/side   Standing alternating hip flexion marches, 2# AW's: 3x8/LE  Seated R ankle DF with 2# AW on foot: 2x20  Regular seated rest breaks need due to knee pain and LE fatigue.  PATIENT EDUCATION:  Education details: Pt educated on utilizing the quad cane in the LUE instead of the RUE to maximize support of AD, and verbalized and demonstrated understanding.  Person educated: Patient Education method: Medical illustrator Education comprehension: verbalized understanding and returned demonstration  HOME EXERCISE PROGRAM: Access Code: [email or web address] URL: [email or web address] Date: 02/17/2023 Prepared by: Ronnie Derby  Exercises - Supine Heel Slide  - 1 x daily - 7 x weekly - 3 sets - 15 reps - Active Straight Leg Raise with Quad Set  - 1 x daily - 4-5 x weekly - 3 sets - 8 reps - Mini Squat with Counter Support  - 1 x daily - 4-5 x weekly - 3 sets - 6 reps - Standing Hip Abduction with Counter Support  - 1 x daily - 4-5 x weekly - 3 sets - 8 reps - Seated Heel Toe Raises  - 1 x daily - 4-5 x weekly - 3 sets - 8 reps  Access Code: [email or web address] URL: [email or web address] Date: 01/14/2023 Prepared by: Ronnie Derby  Exercises - Sit to Stand Without Arm Support  - 1 x daily - 4-5 x weekly - 3 sets - 6 reps - Supine Heel Slide  - 1 x daily - 7 x weekly - 3 sets - 15 reps - Active Straight Leg Raise with  Quad Set  - 1 x daily - 4-5 x weekly - 3 sets - 8 reps  ASSESSMENT:  CLINICAL IMPRESSION: Pt arriving to PT in good spirits despite near fall recently. Continuing POC with focus on progressive Le strengthening, knee flexion AROM and normalizing gait mechanics to improve pain and reduce falls risk. Continuing to tolerable, CKC exercises for knee flexion and quad/glut strengthening to assist in pt's knee pain. Also addressing ankle DF to assist in foot clearance due to limiting knee flexion on RLE in swing phase of gait. Pt with good tolerance of exercises but does need regular rests due to fatigue and knee pain. Pt will benefit from skilled PT services to address these deficits to reduce pain and maximize functional capacity with walking, steps, and household ADL's.   OBJECTIVE IMPAIRMENTS: Abnormal gait, decreased balance, decreased knowledge of use of DME, decreased mobility, difficulty walking, decreased ROM, decreased strength, hypomobility, increased edema, impaired flexibility, and pain.   ACTIVITY LIMITATIONS: carrying, lifting, bending, standing, squatting, stairs, and transfers  PARTICIPATION LIMITATIONS: meal prep, cleaning, and community activity  PERSONAL FACTORS: Age, Time since onset of injury/illness/exacerbation, and 1-2 comorbidities: lymphedema, DM  are also affecting patient's functional outcome.   REHAB POTENTIAL: Fair multi- comorbidities  CLINICAL DECISION MAKING: Stable/uncomplicated  EVALUATION COMPLEXITY: Low   GOALS: Goals reviewed with patient? Yes  SHORT TERM GOALS: Target date: 01/20/23 Pt will be independent with HEP to improve R knee strength and decrease pain with functional activities  Baseline: 12/23/22: HEP deferred to next session.; 02/17/23: 100% compliance.  Goal status: MET  LONG TERM GOALS: Target date: 04/13/22  Pt will improve FOTO to target score to demonstrate clinically significant improvement in functional mobility.  Baseline: 12/23/22:  16/10; 02/17/23: 96/04 Goal status: ON GOING  2. Pt will report <7/10 pain in R knee w/ prolonged ambulation to display clinically significant pain improvement. Baseline: 12/23/22: 9/10; 02/17/23: 8/10 NPS with walking Goal status: ON GOING  3. Pt will improve 5xSTS to </= 11.4 seconds to display improvements in LE strength and equal age- matched norms.  Baseline: 12/23/22: 19.85 seconds; 02/17/23: 13.93 seconds Goal status: ON GOING  4. Pt will improve to >/= 1.0 m/s to display improvements in LE strength and functional mobility to be categorized as a limited community ambulator.  Baseline: 12/23/22: 0.64m/s (household ambulator);  02/17/23: .43 m/s Goal status: ON GOING     PLAN:  PT FREQUENCY: 1-2x/week  PT DURATION: 8 weeks  PLANNED INTERVENTIONS: 97164- PT Re-evaluation, 97110-Therapeutic exercises, 97530- Therapeutic activity, 97112- Neuromuscular re-education, 97535- Self Care, 54098- Manual therapy, [phone]- Gait training, Patient/Family education, Balance training, Stair training, Joint mobilization, Joint manipulation, Cryotherapy, and Moist heat  PLAN FOR NEXT SESSION: knee flexion exercises, hip strengthening, gait training.   Delphia Grates. Fairly IV, PT, DPT Physical Therapist- [place]  [facility]  4:11 PM, 02/19/23

## 2023-02-23 ENCOUNTER — Ambulatory Visit: Payer: 59

## 2023-02-23 DIAGNOSIS — M6281 Muscle weakness (generalized): Secondary | ICD-10-CM

## 2023-02-23 DIAGNOSIS — M25561 Pain in right knee: Secondary | ICD-10-CM | POA: Diagnosis not present

## 2023-02-23 DIAGNOSIS — G8929 Other chronic pain: Secondary | ICD-10-CM

## 2023-02-23 NOTE — Therapy (Signed)
OUTPATIENT PHYSICAL THERAPY TREATMENT/PROGRESS NOTE  Dates of Reporting History: 12/23/22 - 02/23/23   Patient Name: Christine Lawson MRN: [id number] DOB:23-Jun-1960, 61 y.o., female Today's Date: 02/23/2023  END OF SESSION:  PT End of Session - 02/23/23 1442     Visit Number 10    Number of Visits 17    Date for PT Re-Evaluation 04/14/23    Authorization Type [employer]HC Medicare    PT Start Time 1442    PT Stop Time 1515    PT Time Calculation (min) 33 min    Activity Tolerance Patient tolerated treatment well    Behavior During Therapy WFL for tasks assessed/performed             Past Medical History:  Diagnosis Date   Diabetes mellitus without complication (HCC)    Endometrial adenocarcinoma (HCC)    GERD (gastroesophageal reflux disease)    Hematochezia    Hyperlipidemia    Hypertension    IDA (iron deficiency anemia)    Inguinal lymphadenopathy 07/13/2015   Irritable bowel syndrome    Obesity    Osteoarthritis    Sleep apnea    Past Surgical History:  Procedure Laterality Date   ABDOMINAL HYSTERECTOMY  feb 2013   CARPAL TUNNEL RELEASE Left 03/20/2021   Procedure: CARPAL TUNNEL RELEASE ENDOSCOPIC;  Surgeon: Christena Flake, MD;  Location: [facility] ORS;  Service: Orthopedics;  Laterality: Left;   COLONOSCOPY WITH PROPOFOL N/A 08/02/2015   Procedure: COLONOSCOPY WITH PROPOFOL;  Surgeon: Scot Jun, MD;  Location: [facility] ENDOSCOPY;  Service: Endoscopy;  Laterality: N/A;   ESOPHAGOGASTRODUODENOSCOPY (EGD) WITH PROPOFOL N/A 08/02/2015   Procedure: ESOPHAGOGASTRODUODENOSCOPY (EGD) WITH PROPOFOL;  Surgeon: Scot Jun, MD;  Location: [facility] ENDOSCOPY;  Service: Endoscopy;  Laterality: N/A;   Patient Active Problem List   Diagnosis Date Noted   Hepatic steatosis 05/21/2019   Other proteinuria 01/25/2019   Vitamin D deficiency 04/13/2018   Gastroesophageal reflux disease with esophagitis 01/22/2018   Irritable bowel syndrome with diarrhea 04/03/2016   Lymphedema 01/17/2016    Venous (peripheral) insufficiency 01/17/2016   Pain in limb 01/17/2016   Iron deficiency anemia due to chronic blood loss 01/09/2016   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   History of endometrial cancer 08/01/2015   Endometrial adenocarcinoma (HCC) 07/13/2015    Class: History of   Inguinal lymphadenopathy 07/13/2015   Long term current use of insulin (HCC) 12/12/2013   Microalbuminuria 12/12/2013   Diabetes (HCC) 07/28/2013   BP (high blood pressure) 07/28/2013   HLD (hyperlipidemia) 07/28/2013   Adiposity 07/28/2013   Obstructive apnea 07/28/2013   Arthritis, degenerative 07/28/2013   Apnea, sleep 07/28/2013   Thyroid nodule 07/28/2013    PCP: Barbette Reichmann, MD  REFERRING PROVIDER: Barbette Reichmann, MD  REFERRING DIAG:  Acute Pain, R Knee    THERAPY DIAG:  Chronic pain of right knee  Muscle weakness (generalized)  Rationale for Evaluation and Treatment: Rehabilitation  ONSET DATE: Several years ago   SUBJECTIVE:   SUBJECTIVE STATEMENT: Pt reports 8/10 pain NPS. Pt late to session. Has not had another fall.   PERTINENT HISTORY: Pt is a 30 y/p F with chronic R knee pain. Pt reports this pain began several years ago, however her most recent flare up was following a fall that occurred in June/ July of 2024 off of her bed. Pt well known to clinic and has had prior bouts of PT for same condition with known R knee OA. Pt reports 6-7/10 NPS in the medial aspect of the  R knee at rest, and 9/10 NPS with activity. Pt's aggravating factors include prolonged walking, standing, and curb/ stair negotiation. Pt describes her pain as sharp and is felt mostly with knee flexion. Massaging her R knee and taking Tylenol daily, temporarily help to relieve pt's R knee pain. Pt reports intermittent N/T down the RLE.   PAIN:  Are you having pain? 8/10 knee pain   PRECAUTIONS: None  RED FLAGS: None   WEIGHT BEARING RESTRICTIONS: No  FALLS:  Has patient fallen in last 6 months?  Yes. Number of falls 1 June or July, 2024   LIVING ENVIRONMENT: Has following equipment at home: Dan Humphreys - 2 wheeled, [employer]  OCCUPATION: Retired  PLOF: Independent  PATIENT GOALS: "Try to get the leg to bend more and not pop".  NEXT MD VISIT: N/A  OBJECTIVE:  Note: Objective measures were completed at Evaluation unless otherwise noted.  DIAGNOSTIC FINDINGS: N/A  PATIENT SURVEYS:  FOTO 59/72  COGNITION: Overall cognitive status: Within functional limits for tasks assessed     SENSATION: LQNS: WFL  EDEMA:  Bilat LE edema noted 2/2 to pt dx with lymphedema.   MUSCLE LENGTH: Hamstrings (straight leg): Right 28 deg; Left 46 deg  POSTURE: rounded shoulders  PALPATION: TTP at medial and lateral aspect of anterior R and L knee.   LOWER EXTREMITY ROM:  Active ROM Right eval Left eval Left  02/17/23  Hip flexion     Hip extension     Hip abduction     Hip adduction     Hip internal rotation     Hip external rotation     Knee flexion 40* 91* 95  Knee extension 0 0 0  Ankle dorsiflexion     Ankle plantarflexion     Ankle inversion     Ankle eversion      (Blank rows = not tested)  LOWER EXTREMITY MMT:  MMT Right eval Left eval  Hip flexion 4- 4-  Hip extension    Hip abduction (screened in sitting)  4 4  Hip adduction (screened in sitting) 4 4  Hip internal rotation 4- 4-  Hip external rotation 4- 4-  Knee flexion 4 4  Knee extension 4 4  Ankle dorsiflexion 4 4  Ankle plantarflexion    Ankle inversion    Ankle eversion     (Blank rows = not tested)  LOWER EXTREMITY SPECIAL TESTS:  Hip special tests: Luisa Hart (FABER) test: negative and modified FADDIR: negative Knee special tests: Lachman Test: negative   FUNCTIONAL TESTS:  5 times sit to stand: 19.85 seconds 10 meter walk test: 0.54m/s seconds  GAIT: Assistive device utilized: Quad cane small base Level of assistance: Modified independence Comments: Pt notes decreased stance phase and knee  flexion of the RLE, significant RLE eversion and abduction also noted with ambulation. Circumductory in RLE swing phase due to limited R knee flexion.   TODAY'S TREATMENT:  DATE: 02/23/23  There.ex:  FOTO: 56  Seated R knee flexion/extension AAROM on basketball: x20.   TRX squats to chair + airex pad: 3x8  Standing hip abduction with 2# AW's: 3x10/side   Standing alternating hip flexion marches, 2# AW's: 3x10/LE  Seated R ankle DF with 2# AW on foot: 2x20  Side stepping, CGA. 2x4 laps at 10' distance for glut med strengthening  Regular seated rest breaks need due to knee pain and LE fatigue.  PATIENT EDUCATION:  Education details: Pt educated on utilizing the quad cane in the LUE instead of the RUE to maximize support of AD, and verbalized and demonstrated understanding.  Person educated: Patient Education method: Medical illustrator Education comprehension: verbalized understanding and returned demonstration  HOME EXERCISE PROGRAM: Access Code: [email or web address] URL: [email or web address] Date: 02/17/2023 Prepared by: Ronnie Derby  Exercises - Supine Heel Slide  - 1 x daily - 7 x weekly - 3 sets - 15 reps - Active Straight Leg Raise with Quad Set  - 1 x daily - 4-5 x weekly - 3 sets - 8 reps - Mini Squat with Counter Support  - 1 x daily - 4-5 x weekly - 3 sets - 6 reps - Standing Hip Abduction with Counter Support  - 1 x daily - 4-5 x weekly - 3 sets - 8 reps - Seated Heel Toe Raises  - 1 x daily - 4-5 x weekly - 3 sets - 8 reps  Access Code: [email or web address] URL: [email or web address] Date: 01/14/2023 Prepared by: Ronnie Derby  Exercises - Sit to Stand Without Arm Support  - 1 x daily - 4-5 x weekly - 3 sets - 6 reps - Supine Heel Slide  - 1 x daily - 7 x weekly - 3 sets - 15 reps - Active Straight Leg Raise with Quad  Set  - 1 x daily - 4-5 x weekly - 3 sets - 8 reps  ASSESSMENT:  CLINICAL IMPRESSION: Pt on 10th visit warranting progress note. Goals reassessed two visits ago. See that session for specific details. Pt remains with same, moderate to severe knee pain with standing and walking but has made excellent improvements in her R knee AROM, 5xSTS and FOTO score indicative of improved functional mobility and LE strength. Session limited today as pt late, but continuing R knee mobility, hip strength, and tolerance for gait mobility. Patient's condition has the potential to improve in response to therapy. Maximum improvement is yet to be obtained. The anticipated improvement is attainable and reasonable in a generally predictable time. Pt will benefit from skilled PT services to address these deficits to reduce pain and maximize functional capacity with walking, steps, and household ADL's.   OBJECTIVE IMPAIRMENTS: Abnormal gait, decreased balance, decreased knowledge of use of DME, decreased mobility, difficulty walking, decreased ROM, decreased strength, hypomobility, increased edema, impaired flexibility, and pain.   ACTIVITY LIMITATIONS: carrying, lifting, bending, standing, squatting, stairs, and transfers  PARTICIPATION LIMITATIONS: meal prep, cleaning, and community activity  PERSONAL FACTORS: Age, Time since onset of injury/illness/exacerbation, and 1-2 comorbidities: lymphedema, DM  are also affecting patient's functional outcome.   REHAB POTENTIAL: Fair multi- comorbidities  CLINICAL DECISION MAKING: Stable/uncomplicated  EVALUATION COMPLEXITY: Low   GOALS: Goals reviewed with patient? Yes  SHORT TERM GOALS: Target date: 01/20/23 Pt will be independent with HEP to improve R knee strength and decrease pain with functional activities  Baseline: 12/23/22: HEP deferred to next session.; 02/17/23: 100% compliance.  Goal status: MET  LONG TERM GOALS: Target date: 04/13/22  Pt will improve FOTO  to target score to demonstrate clinically significant improvement in functional mobility.  Baseline: 12/23/22: 59/73; 02/17/23: 46/73 Goal status: ON GOING  2. Pt will report <7/10 pain in R knee w/ prolonged ambulation to display clinically significant pain improvement. Baseline: 12/23/22: 9/10; 02/17/23: 8/10 NPS with walking Goal status: ON GOING  3. Pt will improve 5xSTS to </= 11.4 seconds to display improvements in LE strength and equal age- matched norms.  Baseline: 12/23/22: 19.85 seconds; 02/17/23: 13.93 seconds Goal status: ON GOING  4. Pt will improve to >/= 1.0 m/s to display improvements in LE strength and functional mobility to be categorized as a limited community ambulator.  Baseline: 12/23/22: 0.56m/s (household ambulator);  02/17/23: .43 m/s Goal status: ON GOING     PLAN:  PT FREQUENCY: 1-2x/week  PT DURATION: 8 weeks  PLANNED INTERVENTIONS: 97164- PT Re-evaluation, 97110-Therapeutic exercises, 97530- Therapeutic activity, 97112- Neuromuscular re-education, 97535- Self Care, 28413- Manual therapy, [phone]- Gait training, Patient/Family education, Balance training, Stair training, Joint mobilization, Joint manipulation, Cryotherapy, and Moist heat  PLAN FOR NEXT SESSION: knee flexion exercises, hip strengthening, gait training.   Delphia Grates. Fairly IV, PT, DPT Physical Therapist- [place]  [facility]  3:24 PM, 02/23/23

## 2023-02-26 ENCOUNTER — Ambulatory Visit: Payer: 59

## 2023-02-26 DIAGNOSIS — M25561 Pain in right knee: Secondary | ICD-10-CM | POA: Diagnosis not present

## 2023-02-26 DIAGNOSIS — G8929 Other chronic pain: Secondary | ICD-10-CM

## 2023-02-26 DIAGNOSIS — M6281 Muscle weakness (generalized): Secondary | ICD-10-CM

## 2023-02-26 NOTE — Therapy (Signed)
OUTPATIENT PHYSICAL THERAPY TREATMENT  Patient Name: Christine Lawson MRN: [id number] DOB:Jul 21, 1960, 62 y.o., female Today's Date: 02/26/2023  END OF SESSION:  PT End of Session - 02/26/23 1440     Visit Number 11    Number of Visits 17    Date for PT Re-Evaluation 04/14/23    Authorization Type [employer] Medicare    Progress Note Due on Visit 10    PT Start Time 1430    PT Stop Time 1510    PT Time Calculation (min) 40 min    Activity Tolerance Patient tolerated treatment well    Behavior During Therapy WFL for tasks assessed/performed             Past Medical History:  Diagnosis Date   Diabetes mellitus without complication (HCC)    Endometrial adenocarcinoma (HCC)    GERD (gastroesophageal reflux disease)    Hematochezia    Hyperlipidemia    Hypertension    IDA (iron deficiency anemia)    Inguinal lymphadenopathy 07/13/2015   Irritable bowel syndrome    Obesity    Osteoarthritis    Sleep apnea    Past Surgical History:  Procedure Laterality Date   ABDOMINAL HYSTERECTOMY  feb 2013   CARPAL TUNNEL RELEASE Left 03/20/2021   Procedure: CARPAL TUNNEL RELEASE ENDOSCOPIC;  Surgeon: Christena Flake, MD;  Location: [facility] ORS;  Service: Orthopedics;  Laterality: Left;   COLONOSCOPY WITH PROPOFOL N/A 08/02/2015   Procedure: COLONOSCOPY WITH PROPOFOL;  Surgeon: Scot Jun, MD;  Location: [facility] ENDOSCOPY;  Service: Endoscopy;  Laterality: N/A;   ESOPHAGOGASTRODUODENOSCOPY (EGD) WITH PROPOFOL N/A 08/02/2015   Procedure: ESOPHAGOGASTRODUODENOSCOPY (EGD) WITH PROPOFOL;  Surgeon: Scot Jun, MD;  Location: [facility] ENDOSCOPY;  Service: Endoscopy;  Laterality: N/A;   Patient Active Problem List   Diagnosis Date Noted   Hepatic steatosis 05/21/2019   Other proteinuria 01/25/2019   Vitamin D deficiency 04/13/2018   Gastroesophageal reflux disease with esophagitis 01/22/2018   Irritable bowel syndrome with diarrhea 04/03/2016   Lymphedema 01/17/2016   Venous (peripheral)  insufficiency 01/17/2016   Pain in limb 01/17/2016   Iron deficiency anemia due to chronic blood loss 01/09/2016   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   History of endometrial cancer 08/01/2015   Endometrial adenocarcinoma (HCC) 07/13/2015    Class: History of   Inguinal lymphadenopathy 07/13/2015   Long term current use of insulin (HCC) 12/12/2013   Microalbuminuria 12/12/2013   Diabetes (HCC) 07/28/2013   BP (high blood pressure) 07/28/2013   HLD (hyperlipidemia) 07/28/2013   Adiposity 07/28/2013   Obstructive apnea 07/28/2013   Arthritis, degenerative 07/28/2013   Apnea, sleep 07/28/2013   Thyroid nodule 07/28/2013    PCP: Barbette Reichmann, MD  REFERRING PROVIDER: Barbette Reichmann, MD  REFERRING DIAG:  Acute Pain, R Knee    THERAPY DIAG:  Chronic pain of right knee  Muscle weakness (generalized)  Rationale for Evaluation and Treatment: Rehabilitation  ONSET DATE: Several years ago   SUBJECTIVE:   SUBJECTIVE STATEMENT: Pt reports 8/10 pain NPS. Pt reports a nice Christmas day with family.   PERTINENT HISTORY: Pt is a 29 y/p F with chronic R knee pain. Pt reports this pain began several years ago, however her most recent flare up was following a fall that occurred in June/ July of 2024 off of her bed. Pt well known to clinic and has had prior bouts of PT for same condition with known R knee OA. Pt reports 6-7/10 NPS in the medial aspect of the R knee  at rest, and 9/10 NPS with activity. Pt's aggravating factors include prolonged walking, standing, and curb/ stair negotiation. Pt describes her pain as sharp and is felt mostly with knee flexion. Massaging her R knee and taking Tylenol daily, temporarily help to relieve pt's R knee pain. Pt reports intermittent N/T down the RLE.   PAIN:  Are you having pain? 8/10 knee pain   PRECAUTIONS: None  RED FLAGS: None   WEIGHT BEARING RESTRICTIONS: No  FALLS:  Has patient fallen in last 6 months? Yes. Number of falls  2 June or July, 2024   LIVING ENVIRONMENT: Has following equipment at home: Dan Humphreys - 2 wheeled, [employer]  OCCUPATION: Retired  PLOF: Independent  PATIENT GOALS: "Try to get the leg to bend more and not pop".  NEXT MD VISIT: N/A  OBJECTIVE:  Note: Objective measures were completed at Evaluation unless otherwise noted.  DIAGNOSTIC FINDINGS: N/A  PATIENT SURVEYS:  FOTO 59/72  COGNITION: Overall cognitive status: Within functional limits for tasks assessed     SENSATION: LQNS: WFL  EDEMA:  Bilat LE edema noted 2/2 to pt dx with lymphedema.   MUSCLE LENGTH: Hamstrings (straight leg): Right 28 deg; Left 46 deg  POSTURE: rounded shoulders  PALPATION: TTP at medial and lateral aspect of anterior R and L knee.   LOWER EXTREMITY ROM:  Active ROM Right eval Left eval Left  02/17/23  Hip flexion     Hip extension     Hip abduction     Hip adduction     Hip internal rotation     Hip external rotation     Knee flexion 40* 91* 95  Knee extension 0 0 0  Ankle dorsiflexion     Ankle plantarflexion     Ankle inversion     Ankle eversion      (Blank rows = not tested)  LOWER EXTREMITY MMT:  MMT Right eval Left eval  Hip flexion 4- 4-  Hip extension    Hip abduction (screened in sitting)  4 4  Hip adduction (screened in sitting) 4 4  Hip internal rotation 4- 4-  Hip external rotation 4- 4-  Knee flexion 4 4  Knee extension 4 4  Ankle dorsiflexion 4 4  Ankle plantarflexion    Ankle inversion    Ankle eversion     (Blank rows = not tested)  LOWER EXTREMITY SPECIAL TESTS:  Hip special tests: Luisa Hart (FABER) test: negative and modified FADDIR: negative Knee special tests: Lachman Test: negative   FUNCTIONAL TESTS:  5 times sit to stand: 19.85 seconds 10 meter walk test: 0.32m/s seconds  GAIT: Assistive device utilized: Quad cane small base Level of assistance: Modified independence Comments: Pt notes decreased stance phase and knee flexion of the RLE,  significant RLE eversion and abduction also noted with ambulation. Circumductory in RLE swing phase due to limited R knee flexion.   TODAY'S TREATMENT:  DATE: 02/23/23  -AA/ROM Nustep level 3; <3 minutes at seat 5, <3 minutes at seat 5 -STS from gray cahir + airex x8, hands free *sit break -standing heel raises x20  *sit break -STS from gray chair + airex x8, hands free *sit break -standing heel raises x20  *sit break -seated marching, 3lb AW bilat x30 (weight advanced this session)  -standing hip ABDCT x10 bilat, 3lb AW (weight advanced this session)  -standing hammy curlz 1x10 3lb AW  *sit break  -standing hip ABDCT x10 bilat, 3lb AW  -standing hammy curlz 1x10 3lb AW *sit break  -4 inch FWD step up with 2 hand assist (too painful after 3 attempts) -2 inch forward step-ups x12, 2 hand support     PATIENT EDUCATION:  Education details: Continued focus of interventions in the context of recent   Person educated: Patient Education method: Medical illustrator Education comprehension: verbalized understanding and returned demonstration  HOME EXERCISE PROGRAM: Access Code: [email or web address] URL: [email or web address] Date: 02/17/2023 Prepared by: Ronnie Derby  Exercises - Supine Heel Slide  - 1 x daily - 7 x weekly - 3 sets - 15 reps - Active Straight Leg Raise with Quad Set  - 1 x daily - 4-5 x weekly - 3 sets - 8 reps - Mini Squat with Counter Support  - 1 x daily - 4-5 x weekly - 3 sets - 6 reps - Standing Hip Abduction with Counter Support  - 1 x daily - 4-5 x weekly - 3 sets - 8 reps - Seated Heel Toe Raises  - 1 x daily - 4-5 x weekly - 3 sets - 8 reps  Access Code: [email or web address] URL: [email or web address] Date: 01/14/2023 Prepared by: Ronnie Derby  Exercises - Sit to Stand Without Arm Support  - 1 x daily - 4-5 x  weekly - 3 sets - 6 reps - Supine Heel Slide  - 1 x daily - 7 x weekly - 3 sets - 15 reps - Active Straight Leg Raise with Quad Set  - 1 x daily - 4-5 x weekly - 3 sets - 8 reps  ASSESSMENT:  CLINICAL IMPRESSION: Continued with progressive ROM and loading exercises. Pt able to advance AW resistance. Pt still fatigues quickly, requires regular rest breaks. Integrated step ups today for first time, very much pain limited and strategy heavily adapted. The anticipated improvement is attainable and reasonable in a generally predictable time. Pt will benefit from skilled PT services to address these deficits to reduce pain and maximize functional capacity with walking, steps, and household ADL's.   OBJECTIVE IMPAIRMENTS: Abnormal gait, decreased balance, decreased knowledge of use of DME, decreased mobility, difficulty walking, decreased ROM, decreased strength, hypomobility, increased edema, impaired flexibility, and pain.   ACTIVITY LIMITATIONS: carrying, lifting, bending, standing, squatting, stairs, and transfers  PARTICIPATION LIMITATIONS: meal prep, cleaning, and community activity  PERSONAL FACTORS: Age, Time since onset of injury/illness/exacerbation, and 1-2 comorbidities: lymphedema, DM  are also affecting patient's functional outcome.   REHAB POTENTIAL: Fair multi- comorbidities  CLINICAL DECISION MAKING: Stable/uncomplicated  EVALUATION COMPLEXITY: Low   GOALS: Goals reviewed with patient? Yes  SHORT TERM GOALS: Target date: 01/20/23 Pt will be independent with HEP to improve R knee strength and decrease pain with functional activities  Baseline: 12/23/22: HEP deferred to next session.; 02/17/23: 100% compliance.  Goal status: MET  LONG TERM GOALS: Target date: 04/13/22  Pt will improve FOTO to target score to demonstrate clinically significant improvement in functional mobility.  Baseline: 12/23/22: 59/73; 02/17/23:  46/73 Goal status: ON GOING  2. Pt will report <7/10 pain  in R knee w/ prolonged ambulation to display clinically significant pain improvement. Baseline: 12/23/22: 9/10; 02/17/23: 8/10 NPS with walking Goal status: ON GOING  3. Pt will improve 5xSTS to </= 11.4 seconds to display improvements in LE strength and equal age- matched norms.  Baseline: 12/23/22: 19.85 seconds; 02/17/23: 13.93 seconds Goal status: ON GOING  4. Pt will improve to >/= 1.0 m/s to display improvements in LE strength and functional mobility to be categorized as a limited community ambulator.  Baseline: 12/23/22: 0.67m/s (household ambulator);  02/17/23: .43 m/s Goal status: ON GOING     PLAN:  PT FREQUENCY: 1-2x/week  PT DURATION: 8 weeks  PLANNED INTERVENTIONS: 97164- PT Re-evaluation, 97110-Therapeutic exercises, 97530- Therapeutic activity, 97112- Neuromuscular re-education, 97535- Self Care, 54098- Manual therapy, [phone]- Gait training, Patient/Family education, Balance training, Stair training, Joint mobilization, Joint manipulation, Cryotherapy, and Moist heat  PLAN FOR NEXT SESSION: knee flexion exercises, hip strengthening, gait training.   3:03 PM, 02/26/23 Rosamaria Lints, PT, DPT Physical Therapist - [place] [phone] (Office)

## 2023-03-02 ENCOUNTER — Ambulatory Visit: Payer: 59

## 2023-03-02 ENCOUNTER — Ambulatory Visit: Payer: 59 | Admitting: Nurse Practitioner

## 2023-03-02 ENCOUNTER — Inpatient Hospital Stay: Payer: 59

## 2023-03-03 ENCOUNTER — Telehealth: Payer: Self-pay

## 2023-03-03 ENCOUNTER — Ambulatory Visit: Payer: 59

## 2023-03-03 NOTE — Telephone Encounter (Signed)
Called patient about missed appointment today. Voicemail box is full so unable to leave message about next, follow up PT appointment.

## 2023-03-05 ENCOUNTER — Ambulatory Visit: Payer: 59

## 2023-03-10 ENCOUNTER — Ambulatory Visit: Payer: 59

## 2023-03-12 ENCOUNTER — Ambulatory Visit: Payer: 59

## 2023-03-13 ENCOUNTER — Inpatient Hospital Stay: Payer: 59 | Admitting: Nurse Practitioner

## 2023-03-13 ENCOUNTER — Ambulatory Visit: Payer: 59

## 2023-03-13 ENCOUNTER — Inpatient Hospital Stay: Payer: 59

## 2023-03-17 ENCOUNTER — Ambulatory Visit: Payer: 59 | Attending: Internal Medicine

## 2023-03-17 DIAGNOSIS — M6281 Muscle weakness (generalized): Secondary | ICD-10-CM | POA: Insufficient documentation

## 2023-03-17 DIAGNOSIS — G8929 Other chronic pain: Secondary | ICD-10-CM | POA: Diagnosis present

## 2023-03-17 DIAGNOSIS — M25561 Pain in right knee: Secondary | ICD-10-CM | POA: Diagnosis present

## 2023-03-17 NOTE — Therapy (Signed)
 OUTPATIENT PHYSICAL THERAPY TREATMENT  Patient Name: Christine Lawson MRN: [id number] DOB:November 07, 1960, 63 y.o., female Today's Date: 03/17/2023  END OF SESSION:  PT End of Session - 03/17/23 1346     Visit Number 12    Number of Visits 17    Date for PT Re-Evaluation 04/14/23    Authorization Type [employer] Medicare    Progress Note Due on Visit 10    PT Start Time 1345    PT Stop Time 1430    PT Time Calculation (min) 45 min    Activity Tolerance Patient tolerated treatment well    Behavior During Therapy WFL for tasks assessed/performed             Past Medical History:  Diagnosis Date   Diabetes mellitus without complication (HCC)    Endometrial adenocarcinoma (HCC)    GERD (gastroesophageal reflux disease)    Hematochezia    Hyperlipidemia    Hypertension    IDA (iron  deficiency anemia)    Inguinal lymphadenopathy 07/13/2015   Irritable bowel syndrome    Obesity    Osteoarthritis    Sleep apnea    Past Surgical History:  Procedure Laterality Date   ABDOMINAL HYSTERECTOMY  feb 2013   CARPAL TUNNEL RELEASE Left 03/20/2021   Procedure: CARPAL TUNNEL RELEASE ENDOSCOPIC;  Surgeon: Edie Norleen PARAS, MD;  Location: [facility] ORS;  Service: Orthopedics;  Laterality: Left;   COLONOSCOPY WITH PROPOFOL  N/A 08/02/2015   Procedure: COLONOSCOPY WITH PROPOFOL ;  Surgeon: Lamar ONEIDA Holmes, MD;  Location: [facility] ENDOSCOPY;  Service: Endoscopy;  Laterality: N/A;   ESOPHAGOGASTRODUODENOSCOPY (EGD) WITH PROPOFOL  N/A 08/02/2015   Procedure: ESOPHAGOGASTRODUODENOSCOPY (EGD) WITH PROPOFOL ;  Surgeon: Lamar ONEIDA Holmes, MD;  Location: [facility] ENDOSCOPY;  Service: Endoscopy;  Laterality: N/A;   Patient Active Problem List   Diagnosis Date Noted   Hepatic steatosis 05/21/2019   Other proteinuria 01/25/2019   Vitamin D deficiency 04/13/2018   Gastroesophageal reflux disease with esophagitis 01/22/2018   Irritable bowel syndrome with diarrhea 04/03/2016   Lymphedema 01/17/2016   Venous (peripheral)  insufficiency 01/17/2016   Pain in limb 01/17/2016   Iron  deficiency anemia due to chronic blood loss 01/09/2016   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   History of endometrial cancer 08/01/2015   Endometrial adenocarcinoma (HCC) 07/13/2015    Class: History of   Inguinal lymphadenopathy 07/13/2015   Long term current use of insulin (HCC) 12/12/2013   Microalbuminuria 12/12/2013   Diabetes (HCC) 07/28/2013   BP (high blood pressure) 07/28/2013   HLD (hyperlipidemia) 07/28/2013   Adiposity 07/28/2013   Obstructive apnea 07/28/2013   Arthritis, degenerative 07/28/2013   Apnea, sleep 07/28/2013   Thyroid nodule 07/28/2013    PCP: Sadie Manna, MD  REFERRING PROVIDER: Sadie Manna, MD  REFERRING DIAG:  Acute Pain, R Knee    THERAPY DIAG:  Chronic pain of right knee  Muscle weakness (generalized)  Rationale for Evaluation and Treatment: Rehabilitation  ONSET DATE: Several years ago   SUBJECTIVE:   SUBJECTIVE STATEMENT: Pt reports 8/10 pain NPS. Pt reports stomach issues are improving.   PERTINENT HISTORY: Pt is a 89 y/p F with chronic R knee pain. Pt reports this pain began several years ago, however her most recent flare up was following a fall that occurred in June/ July of 2024 off of her bed. Pt well known to clinic and has had prior bouts of PT for same condition with known R knee OA. Pt reports 6-7/10 NPS in the medial aspect of the R knee at rest,  and 9/10 NPS with activity. Pt's aggravating factors include prolonged walking, standing, and curb/ stair negotiation. Pt describes her pain as sharp and is felt mostly with knee flexion. Massaging her R knee and taking Tylenol  daily, temporarily help to relieve pt's R knee pain. Pt reports intermittent N/T down the RLE.   PAIN:  Are you having pain? 8/10 knee pain   PRECAUTIONS: None  RED FLAGS: None   WEIGHT BEARING RESTRICTIONS: No  FALLS:  Has patient fallen in last 6 months? Yes. Number of falls 2 June  or July, 2024   LIVING ENVIRONMENT: Has following equipment at home: Vannie - 2 wheeled, [employer]  OCCUPATION: Retired  PLOF: Independent  PATIENT GOALS: Try to get the leg to bend more and not pop.  NEXT MD VISIT: N/A  OBJECTIVE:  Note: Objective measures were completed at Evaluation unless otherwise noted.  DIAGNOSTIC FINDINGS: N/A  PATIENT SURVEYS:  FOTO 59/72  COGNITION: Overall cognitive status: Within functional limits for tasks assessed     SENSATION: LQNS: WFL  EDEMA:  Bilat LE edema noted 2/2 to pt dx with lymphedema.   MUSCLE LENGTH: Hamstrings (straight leg): Right 28 deg; Left 46 deg  POSTURE: rounded shoulders  PALPATION: TTP at medial and lateral aspect of anterior R and L knee.   LOWER EXTREMITY ROM:  Active ROM Right eval Left eval Left  02/17/23  Hip flexion     Hip extension     Hip abduction     Hip adduction     Hip internal rotation     Hip external rotation     Knee flexion 40* 91* 95  Knee extension 0 0 0  Ankle dorsiflexion     Ankle plantarflexion     Ankle inversion     Ankle eversion      (Blank rows = not tested)  LOWER EXTREMITY MMT:  MMT Right eval Left eval  Hip flexion 4- 4-  Hip extension    Hip abduction (screened in sitting)  4 4  Hip adduction (screened in sitting) 4 4  Hip internal rotation 4- 4-  Hip external rotation 4- 4-  Knee flexion 4 4  Knee extension 4 4  Ankle dorsiflexion 4 4  Ankle plantarflexion    Ankle inversion    Ankle eversion     (Blank rows = not tested)  LOWER EXTREMITY SPECIAL TESTS:  Hip special tests: Belvie (FABER) test: negative and modified FADDIR: negative Knee special tests: Lachman Test: negative   FUNCTIONAL TESTS:  5 times sit to stand: 19.85 seconds 10 meter walk test: 0.50m/s seconds  GAIT: Assistive device utilized: Quad cane small base Level of assistance: Modified independence Comments: Pt notes decreased stance phase and knee flexion of the RLE, significant  RLE eversion and abduction also noted with ambulation. Circumductory in RLE swing phase due to limited R knee flexion.   TODAY'S TREATMENT:  DATE: 03/17/23  There.ex: Nu-Step L2 for 5 min seat 6 for R knee flexion  Side stepping 2# AW's: 4x20'. VC's for correct QC sequencing.   Standing knee flexion with 3# AW's: 2x12, BUE support  Seated LAQ with 5# AW's. 3x10, RLE.   Gait training: 4 curb step ups: 2x6 reps. PT demo for correct LE sequencing and QC sequencing. Seated rest b/t sets. Generally good sequencing. Very min VC's for correct QC and LE performance.    PATIENT EDUCATION:  Education details: Continued focus of interventions in the context of recent   Person educated: Patient Education method: Medical Illustrator Education comprehension: verbalized understanding and returned demonstration  HOME EXERCISE PROGRAM: Access Code: [email or web address] URL: [email or web address] Date: 02/17/2023 Prepared by: Dorina Kingfisher  Exercises - Supine Heel Slide  - 1 x daily - 7 x weekly - 3 sets - 15 reps - Active Straight Leg Raise with Quad Set  - 1 x daily - 4-5 x weekly - 3 sets - 8 reps - Mini Squat with Counter Support  - 1 x daily - 4-5 x weekly - 3 sets - 6 reps - Standing Hip Abduction with Counter Support  - 1 x daily - 4-5 x weekly - 3 sets - 8 reps - Seated Heel Toe Raises  - 1 x daily - 4-5 x weekly - 3 sets - 8 reps  Access Code: [email or web address] URL: [email or web address] Date: 01/14/2023 Prepared by: Dorina Kingfisher  Exercises - Sit to Stand Without Arm Support  - 1 x daily - 4-5 x weekly - 3 sets - 6 reps - Supine Heel Slide  - 1 x daily - 7 x weekly - 3 sets - 15 reps - Active Straight Leg Raise with Quad Set  - 1 x daily - 4-5 x weekly - 3 sets - 8 reps  ASSESSMENT:  CLINICAL IMPRESSION: Continuing PT POC with  progressive ROM and loading exercises. Pt has been absent for ~2-3 weeks from PT due to stomach issues so reliant on seated rest breaks regularly. Initiating curb navigation today to reduce falls risk. Generally good understanding with correct LE and QC sequencing after PT demo and min multimodal cuing. Will continue to address in future sessions to ensure excellent carryover for community walking tasks. Pt remains with limited knee flexion AROM and painful weightbearing limiting household and community ADL completion. Pt will benefit from skilled PT services to address these deficits to reduce pain and maximize functional capacity with walking, steps, and household ADL's.   OBJECTIVE IMPAIRMENTS: Abnormal gait, decreased balance, decreased knowledge of use of DME, decreased mobility, difficulty walking, decreased ROM, decreased strength, hypomobility, increased edema, impaired flexibility, and pain.   ACTIVITY LIMITATIONS: carrying, lifting, bending, standing, squatting, stairs, and transfers  PARTICIPATION LIMITATIONS: meal prep, cleaning, and community activity  PERSONAL FACTORS: Age, Time since onset of injury/illness/exacerbation, and 1-2 comorbidities: lymphedema, DM  are also affecting patient's functional outcome.   REHAB POTENTIAL: Fair multi- comorbidities  CLINICAL DECISION MAKING: Stable/uncomplicated  EVALUATION COMPLEXITY: Low   GOALS: Goals reviewed with patient? Yes  SHORT TERM GOALS: Target date: 01/20/23 Pt will be independent with HEP to improve R knee strength and decrease pain with functional activities  Baseline: 12/23/22: HEP deferred to next session.; 02/17/23: 100% compliance.  Goal status: MET  LONG TERM GOALS: Target date: 04/13/22  Pt will improve FOTO to target score to demonstrate clinically significant improvement in functional mobility.  Baseline: 12/23/22: 59/73; 02/17/23: 46/73 Goal status: ON GOING  2. Pt will report <  7/10 pain in R knee w/ prolonged  ambulation to display clinically significant pain improvement. Baseline: 12/23/22: 9/10; 02/17/23: 8/10 NPS with walking Goal status: ON GOING  3. Pt will improve 5xSTS to </= 11.4 seconds to display improvements in LE strength and equal age- matched norms.  Baseline: 12/23/22: 19.85 seconds; 02/17/23: 13.93 seconds Goal status: ON GOING  4. Pt will improve to >/= 1.0 m/s to display improvements in LE strength and functional mobility to be categorized as a limited community ambulator.  Baseline: 12/23/22: 0.13m/s (household ambulator);  02/17/23: .43 m/s Goal status: ON GOING     PLAN:  PT FREQUENCY: 1-2x/week  PT DURATION: 8 weeks  PLANNED INTERVENTIONS: 97164- PT Re-evaluation, 97110-Therapeutic exercises, 97530- Therapeutic activity, 97112- Neuromuscular re-education, 97535- Self Care, [id number]- Manual therapy, [phone]- Gait training, Patient/Family education, Balance training, Stair training, Joint mobilization, Joint manipulation, Cryotherapy, and Moist heat  PLAN FOR NEXT SESSION: knee flexion exercises, hip strengthening, gait training with curbs.   Dorina HERO. Fairly IV, PT, DPT Physical Therapist- [place]  [facility]  2:27 PM, 03/17/23

## 2023-03-18 ENCOUNTER — Encounter: Payer: Self-pay | Admitting: Internal Medicine

## 2023-03-19 ENCOUNTER — Ambulatory Visit: Payer: 59

## 2023-03-19 DIAGNOSIS — M6281 Muscle weakness (generalized): Secondary | ICD-10-CM

## 2023-03-19 DIAGNOSIS — M25561 Pain in right knee: Secondary | ICD-10-CM | POA: Diagnosis not present

## 2023-03-19 DIAGNOSIS — G8929 Other chronic pain: Secondary | ICD-10-CM

## 2023-03-19 NOTE — Therapy (Signed)
OUTPATIENT PHYSICAL THERAPY TREATMENT  Patient Name: Christine Lawson MRN: [id number] DOB:04/19/1960, 63 y.o., female Today's Date: 03/19/2023  END OF SESSION:  PT End of Session - 03/19/23 1348     Visit Number 13    Number of Visits 17    Date for PT Re-Evaluation 04/14/23    Authorization Type [employer] Medicare    Progress Note Due on Visit 10    PT Start Time 1345    PT Stop Time 1430    PT Time Calculation (min) 45 min    Activity Tolerance Patient tolerated treatment well    Behavior During Therapy WFL for tasks assessed/performed             Past Medical History:  Diagnosis Date   Diabetes mellitus without complication (HCC)    Endometrial adenocarcinoma (HCC)    GERD (gastroesophageal reflux disease)    Hematochezia    Hyperlipidemia    Hypertension    IDA (iron deficiency anemia)    Inguinal lymphadenopathy 07/13/2015   Irritable bowel syndrome    Obesity    Osteoarthritis    Sleep apnea    Past Surgical History:  Procedure Laterality Date   ABDOMINAL HYSTERECTOMY  feb 2013   CARPAL TUNNEL RELEASE Left 03/20/2021   Procedure: CARPAL TUNNEL RELEASE ENDOSCOPIC;  Surgeon: Christena Flake, MD;  Location: [facility] ORS;  Service: Orthopedics;  Laterality: Left;   COLONOSCOPY WITH PROPOFOL N/A 08/02/2015   Procedure: COLONOSCOPY WITH PROPOFOL;  Surgeon: Scot Jun, MD;  Location: [facility] ENDOSCOPY;  Service: Endoscopy;  Laterality: N/A;   ESOPHAGOGASTRODUODENOSCOPY (EGD) WITH PROPOFOL N/A 08/02/2015   Procedure: ESOPHAGOGASTRODUODENOSCOPY (EGD) WITH PROPOFOL;  Surgeon: Scot Jun, MD;  Location: [facility] ENDOSCOPY;  Service: Endoscopy;  Laterality: N/A;   Patient Active Problem List   Diagnosis Date Noted   Hepatic steatosis 05/21/2019   Other proteinuria 01/25/2019   Vitamin D deficiency 04/13/2018   Gastroesophageal reflux disease with esophagitis 01/22/2018   Irritable bowel syndrome with diarrhea 04/03/2016   Lymphedema 01/17/2016   Venous (peripheral)  insufficiency 01/17/2016   Pain in limb 01/17/2016   Iron deficiency anemia due to chronic blood loss 01/09/2016   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   History of endometrial cancer 08/01/2015   Endometrial adenocarcinoma (HCC) 07/13/2015    Class: History of   Inguinal lymphadenopathy 07/13/2015   Long term current use of insulin (HCC) 12/12/2013   Microalbuminuria 12/12/2013   Diabetes (HCC) 07/28/2013   BP (high blood pressure) 07/28/2013   HLD (hyperlipidemia) 07/28/2013   Adiposity 07/28/2013   Obstructive apnea 07/28/2013   Arthritis, degenerative 07/28/2013   Apnea, sleep 07/28/2013   Thyroid nodule 07/28/2013    PCP: Barbette Reichmann, MD  REFERRING PROVIDER: Barbette Reichmann, MD  REFERRING DIAG:  Acute Pain, R Knee    THERAPY DIAG:  Chronic pain of right knee  Muscle weakness (generalized)  Rationale for Evaluation and Treatment: Rehabilitation  ONSET DATE: Several years ago   SUBJECTIVE:   SUBJECTIVE STATEMENT: Pt reports 8/10 pain NPS. Pt reports needing to use a scooter in [employer] due to knee pain.   PERTINENT HISTORY: Pt is a 35 y/p F with chronic R knee pain. Pt reports this pain began several years ago, however her most recent flare up was following a fall that occurred in June/ July of 2024 off of her bed. Pt well known to clinic and has had prior bouts of PT for same condition with known R knee OA. Pt reports 6-7/10 NPS in the  medial aspect of the R knee at rest, and 9/10 NPS with activity. Pt's aggravating factors include prolonged walking, standing, and curb/ stair negotiation. Pt describes her pain as sharp and is felt mostly with knee flexion. Massaging her R knee and taking Tylenol daily, temporarily help to relieve pt's R knee pain. Pt reports intermittent N/T down the RLE.   PAIN:  Are you having pain? 8/10 knee pain   PRECAUTIONS: None  RED FLAGS: None   WEIGHT BEARING RESTRICTIONS: No  FALLS:  Has patient fallen in last 6 months?  Yes. Number of falls 2 June or July, 2024   LIVING ENVIRONMENT: Has following equipment at home: [employer]re[employer] - 2 wheeled, [employer]  OCCUPATION: Retired  PLOF: Independent  PATIENT GOALS: "Try to get the leg to bend more and not pop".  NEXT MD VISIT: N/A  OBJECTIVE:  Note: Objective measures were completed at Evaluation unless otherwise noted.  DIAGNOSTIC FINDINGS: N/A  PATIENT SURVEYS:  FOTO 59/72  COGNITION: Overall cognitive status: Within functional limits for tasks assessed     SENSATION: LQNS: WFL  EDEMA:  Bilat LE edema noted 2/2 to pt dx with lymphedema.   MUSCLE LENGTH: Hamstrings (straight leg): Right 28 deg; Left 46 deg  POSTURE: rounded shoulders  PALPATION: TTP at medial and lateral aspect of anterior R and L knee.   LOWER EXTREMITY ROM:  Active ROM Right eval Left eval Left  02/17/23  Hip flexion     Hip extension     Hip abduction     Hip adduction     Hip internal rotation     Hip external rotation     Knee flexion 40* 91* 95  Knee extension 0 0 0  Ankle dorsiflexion     Ankle plantarflexion     Ankle inversion     Ankle eversion      (Blank rows = not tested)  LOWER EXTREMITY MMT:  MMT Right eval Left eval  Hip flexion 4- 4-  Hip extension    Hip abduction (screened in sitting)  4 4  Hip adduction (screened in sitting) 4 4  Hip internal rotation 4- 4-  Hip external rotation 4- 4-  Knee flexion 4 4  Knee extension 4 4  Ankle dorsiflexion 4 4  Ankle plantarflexion    Ankle inversion    Ankle eversion     (Blank rows = not tested)  LOWER EXTREMITY SPECIAL TESTS:  Hip special tests: Luisa Hart (FABER) test: negative and modified FADDIR: negative Knee special tests: Lachman Test: negative   FUNCTIONAL TESTS:  5 times sit to stand: 19.85 seconds 10 meter walk test: 0.30m/s seconds  GAIT: Assistive device utilized: Quad cane small base Level of assistance: Modified independence Comments: Pt notes decreased stance phase and knee  flexion of the RLE, significant RLE eversion and abduction also noted with ambulation. Circumductory in RLE swing phase due to limited R knee flexion.   TODAY'S TREATMENT:  DATE: 03/19/23  There.ex: Nu-Step L4 for 5 min seat 6 for R knee flexion AAROM  Side stepping 2# AW's: 2x6x10'. CGA. Improved form without QC today. Seated rest after 6 laps.   TRX squat to  grey chair + airex pad: 3x8  Standing knee flexion with 3# AW's: 2x12, BUE support  Gait training: 10 minutes 4" curb step ups: 1x4 reps. 1x6 reps. Excellent QC and LE sequencing for improved safety today. Reliant only once with min VC's with descent leading with RLE. VC's for R knee flexion with ascending as opposed to circumductory compensation. Fair carryover.    PATIENT EDUCATION:  Education details: Continued focus of interventions in the context of recent   Person educated: Patient Education method: Medical illustrator Education comprehension: verbalized understanding and returned demonstration  HOME EXERCISE PROGRAM: Access Code: [email or web address] URL: [email or web address] Date: 02/17/2023 Prepared by: Ronnie Derby  Exercises - Supine Heel Slide  - 1 x daily - 7 x weekly - 3 sets - 15 reps - Active Straight Leg Raise with Quad Set  - 1 x daily - 4-5 x weekly - 3 sets - 8 reps - Mini Squat with Counter Support  - 1 x daily - 4-5 x weekly - 3 sets - 6 reps - Standing Hip Abduction with Counter Support  - 1 x daily - 4-5 x weekly - 3 sets - 8 reps - Seated Heel Toe Raises  - 1 x daily - 4-5 x weekly - 3 sets - 8 reps  Access Code: [email or web address] URL: [email or web address] Date: 01/14/2023 Prepared by: Ronnie Derby  Exercises - Sit to Stand Without Arm Support  - 1 x daily - 4-5 x weekly - 3 sets - 6 reps - Supine Heel Slide  - 1 x daily - 7 x weekly - 3 sets - 15  reps - Active Straight Leg Raise with Quad Set  - 1 x daily - 4-5 x weekly - 3 sets - 8 reps  ASSESSMENT:  CLINICAL IMPRESSION: Continuing PT POC with progressive ROM and loading exercises. Pt with excellent carryover today with curb navigation with correct LE and QC sequencing indicating lower falls risk with community ADL navigation. Still reliant on regular VC's throughout session for R knee flexion in swing phases of gait, step ups, and other gait exercises. Pt remains with limited knee flexion AROM and painful weightbearing limiting household and community ADL completion. Pt will benefit from skilled PT services to address these deficits to reduce pain and maximize functional capacity with walking, steps, and household ADL's.    OBJECTIVE IMPAIRMENTS: Abnormal gait, decreased balance, decreased knowledge of use of DME, decreased mobility, difficulty walking, decreased ROM, decreased strength, hypomobility, increased edema, impaired flexibility, and pain.   ACTIVITY LIMITATIONS: carrying, lifting, bending, standing, squatting, stairs, and transfers  PARTICIPATION LIMITATIONS: meal prep, cleaning, and community activity  PERSONAL FACTORS: Age, Time since onset of injury/illness/exacerbation, and 1-2 comorbidities: lymphedema, DM  are also affecting patient's functional outcome.   REHAB POTENTIAL: Fair multi- comorbidities  CLINICAL DECISION MAKING: Stable/uncomplicated  EVALUATION COMPLEXITY: Low   GOALS: Goals reviewed with patient? Yes  SHORT TERM GOALS: Target date: 01/20/23 Pt will be independent with HEP to improve R knee strength and decrease pain with functional activities  Baseline: 12/23/22: HEP deferred to next session.; 02/17/23: 100% compliance.  Goal status: MET  LONG TERM GOALS: Target date: 04/13/22  Pt will improve FOTO to target score to demonstrate clinically significant improvement in functional mobility.  Baseline: 12/23/22: 59/73; 02/17/23: 16/10 Goal  status: ON GOING  2. Pt will report <7/10 pain in R knee w/ prolonged ambulation to display clinically significant pain improvement. Baseline: 12/23/22: 9/10; 02/17/23: 8/10 NPS with walking Goal status: ON GOING  3. Pt will improve 5xSTS to </= 11.4 seconds to display improvements in LE strength and equal age- matched norms.  Baseline: 12/23/22: 19.85 seconds; 02/17/23: 13.93 seconds Goal status: ON GOING  4. Pt will improve to >/= 1.0 m/s to display improvements in LE strength and functional mobility to be categorized as a limited community ambulator.  Baseline: 12/23/22: 0.57m/s (household ambulator);  02/17/23: .43 m/s Goal status: ON GOING     PLAN:  PT FREQUENCY: 1-2x/week  PT DURATION: 8 weeks  PLANNED INTERVENTIONS: 97164- PT Re-evaluation, 97110-Therapeutic exercises, 97530- Therapeutic activity, 97112- Neuromuscular re-education, 97535- Self Care, [id number]- Manual therapy, [phone]- Gait training, Patient/Family education, Balance training, Stair training, Joint mobilization, Joint manipulation, Cryotherapy, and Moist heat  PLAN FOR NEXT SESSION: knee flexion exercises, hip strengthening, gait training with curbs.   Delphia Grates. Fairly IV, PT, DPT Physical Therapist- [place]  [facility]  3:35 PM, 03/19/23

## 2023-03-24 ENCOUNTER — Ambulatory Visit: Payer: 59

## 2023-03-24 DIAGNOSIS — G8929 Other chronic pain: Secondary | ICD-10-CM

## 2023-03-24 DIAGNOSIS — M6281 Muscle weakness (generalized): Secondary | ICD-10-CM

## 2023-03-24 DIAGNOSIS — M25561 Pain in right knee: Secondary | ICD-10-CM | POA: Diagnosis not present

## 2023-03-24 NOTE — Therapy (Signed)
OUTPATIENT PHYSICAL THERAPY TREATMENT  Patient Name: Christine Lawson MRN: [id number] DOB:1961-02-13, 63 y.o., female Today's Date: 03/24/2023  END OF SESSION:  PT End of Session - 03/24/23 1435     Visit Number 14    Number of Visits 17    Date for PT Re-Evaluation 04/14/23    Authorization Type [employer] Medicare    Progress Note Due on Visit 10    PT Start Time 1430    PT Stop Time 1514    PT Time Calculation (min) 44 min    Activity Tolerance Patient tolerated treatment well    Behavior During Therapy WFL for tasks assessed/performed             Past Medical History:  Diagnosis Date   Diabetes mellitus without complication (HCC)    Endometrial adenocarcinoma (HCC)    GERD (gastroesophageal reflux disease)    Hematochezia    Hyperlipidemia    Hypertension    IDA (iron deficiency anemia)    Inguinal lymphadenopathy 07/13/2015   Irritable bowel syndrome    Obesity    Osteoarthritis    Sleep apnea    Past Surgical History:  Procedure Laterality Date   ABDOMINAL HYSTERECTOMY  feb 2013   CARPAL TUNNEL RELEASE Left 03/20/2021   Procedure: CARPAL TUNNEL RELEASE ENDOSCOPIC;  Surgeon: Christena Flake, MD;  Location: [facility] ORS;  Service: Orthopedics;  Laterality: Left;   COLONOSCOPY WITH PROPOFOL N/A 08/02/2015   Procedure: COLONOSCOPY WITH PROPOFOL;  Surgeon: Scot Jun, MD;  Location: [facility] ENDOSCOPY;  Service: Endoscopy;  Laterality: N/A;   ESOPHAGOGASTRODUODENOSCOPY (EGD) WITH PROPOFOL N/A 08/02/2015   Procedure: ESOPHAGOGASTRODUODENOSCOPY (EGD) WITH PROPOFOL;  Surgeon: Scot Jun, MD;  Location: [facility] ENDOSCOPY;  Service: Endoscopy;  Laterality: N/A;   Patient Active Problem List   Diagnosis Date Noted   Hepatic steatosis 05/21/2019   Other proteinuria 01/25/2019   Vitamin D deficiency 04/13/2018   Gastroesophageal reflux disease with esophagitis 01/22/2018   Irritable bowel syndrome with diarrhea 04/03/2016   Lymphedema 01/17/2016   Venous (peripheral)  insufficiency 01/17/2016   Pain in limb 01/17/2016   Iron deficiency anemia due to chronic blood loss 01/09/2016   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   History of endometrial cancer 08/01/2015   Endometrial adenocarcinoma (HCC) 07/13/2015    Class: History of   Inguinal lymphadenopathy 07/13/2015   Long term current use of insulin (HCC) 12/12/2013   Microalbuminuria 12/12/2013   Diabetes (HCC) 07/28/2013   BP (high blood pressure) 07/28/2013   HLD (hyperlipidemia) 07/28/2013   Adiposity 07/28/2013   Obstructive apnea 07/28/2013   Arthritis, degenerative 07/28/2013   Apnea, sleep 07/28/2013   Thyroid nodule 07/28/2013    PCP: Barbette Reichmann, MD  REFERRING PROVIDER: Barbette Reichmann, MD  REFERRING DIAG:  Acute Pain, R Knee    THERAPY DIAG:  Chronic pain of right knee  Muscle weakness (generalized)  Rationale for Evaluation and Treatment: Rehabilitation  ONSET DATE: Several years ago   SUBJECTIVE:   SUBJECTIVE STATEMENT: Pt reports 8.5/10 pain NPS. Has been working on [employer]. Affected by the cold weather as well.   PERTINENT HISTORY: Pt is a 28 y/p F with chronic R knee pain. Pt reports this pain began several years ago, however her most recent flare up was following a fall that occurred in June/ July of 2024 off of her bed. Pt well known to clinic and has had prior bouts of PT for same condition with known R knee OA. Pt reports 6-7/10 NPS in the medial  aspect of the R knee at rest, and 9/10 NPS with activity. Pt's aggravating factors include prolonged walking, standing, and curb/ stair negotiation. Pt describes her pain as sharp and is felt mostly with knee flexion. Massaging her R knee and taking Tylenol daily, temporarily help to relieve pt's R knee pain. Pt reports intermittent N/T down the RLE.   PAIN:  Are you having pain? 8.5/10 knee pain   PRECAUTIONS: None  RED FLAGS: None   WEIGHT BEARING RESTRICTIONS: No  FALLS:  Has patient fallen in last 6  months? Yes. Number of falls 2 June or July, 2024   LIVING ENVIRONMENT: Has following equipment at home: Dan Humphreys - 2 wheeled, [employer]  OCCUPATION: Retired  PLOF: Independent  PATIENT GOALS: "Try to get the leg to bend more and not pop".  NEXT MD VISIT: N/A  OBJECTIVE:  Note: Objective measures were completed at Evaluation unless otherwise noted.  DIAGNOSTIC FINDINGS: N/A  PATIENT SURVEYS:  FOTO 59/72  COGNITION: Overall cognitive status: Within functional limits for tasks assessed     SENSATION: LQNS: WFL  EDEMA:  Bilat LE edema noted 2/2 to pt dx with lymphedema.   MUSCLE LENGTH: Hamstrings (straight leg): Right 28 deg; Left 46 deg  POSTURE: rounded shoulders  PALPATION: TTP at medial and lateral aspect of anterior R and L knee.   LOWER EXTREMITY ROM:  Active ROM Right eval Left eval Left  02/17/23  Hip flexion     Hip extension     Hip abduction     Hip adduction     Hip internal rotation     Hip external rotation     Knee flexion 40* 91* 95  Knee extension 0 0 0  Ankle dorsiflexion     Ankle plantarflexion     Ankle inversion     Ankle eversion      (Blank rows = not tested)  LOWER EXTREMITY MMT:  MMT Right eval Left eval  Hip flexion 4- 4-  Hip extension    Hip abduction (screened in sitting)  4 4  Hip adduction (screened in sitting) 4 4  Hip internal rotation 4- 4-  Hip external rotation 4- 4-  Knee flexion 4 4  Knee extension 4 4  Ankle dorsiflexion 4 4  Ankle plantarflexion    Ankle inversion    Ankle eversion     (Blank rows = not tested)  LOWER EXTREMITY SPECIAL TESTS:  Hip special tests: Luisa Hart (FABER) test: negative and modified FADDIR: negative Knee special tests: Lachman Test: negative   FUNCTIONAL TESTS:  5 times sit to stand: 19.85 seconds 10 meter walk test: 0.79m/s seconds  GAIT: Assistive device utilized: Quad cane small base Level of assistance: Modified independence Comments: Pt notes decreased stance phase and  knee flexion of the RLE, significant RLE eversion and abduction also noted with ambulation. Circumductory in RLE swing phase due to limited R knee flexion.   TODAY'S TREATMENT:  DATE: 03/24/23  There.ex: Nu-Step L5 for 5 min seat 6 for R knee flexion AAROM  Side stepping 3# AW's: 2x6x10'. CGA. Improved form without QC today. Seated rest after 6 laps.   STS from mat table with 3 KG med ball: 1x8. 2x10.   Sled pushes 4x 10 meters, 20#. Working on hip and knee extension strength CGA,     R Knee flexion in sitting: 95 degrees   Gait training: 13 minutes 4" curb step ups with 3# AW's: x8 reps. Excellent QC and LE sequencing for improved safety today. Reliant only once with min VC's with descent leading with RLE. VC's for R knee flexion with ascending as opposed to circumductory compensation. Fair carryover.   Gait 2x100' with QC in LUE. Working on B foot clearance in swing phase and primarily R knee flexion in swing phase. CGA. Decreased foot clearance by end of second lap due to fatigue.    PATIENT EDUCATION:  Education details: Continued focus of interventions in the context of recent   Person educated: Patient Education method: Medical illustrator Education comprehension: verbalized understanding and returned demonstration  HOME EXERCISE PROGRAM: Access Code: [email or web address] URL: [email or web address] Date: 02/17/2023 Prepared by: Ronnie Derby  Exercises - Supine Heel Slide  - 1 x daily - 7 x weekly - 3 sets - 15 reps - Active Straight Leg Raise with Quad Set  - 1 x daily - 4-5 x weekly - 3 sets - 8 reps - Mini Squat with Counter Support  - 1 x daily - 4-5 x weekly - 3 sets - 6 reps - Standing Hip Abduction with Counter Support  - 1 x daily - 4-5 x weekly - 3 sets - 8 reps - Seated Heel Toe Raises  - 1 x daily - 4-5 x weekly - 3 sets - 8  reps  Access Code: [email or web address] URL: [email or web address] Date: 01/14/2023 Prepared by: Ronnie Derby  Exercises - Sit to Stand Without Arm Support  - 1 x daily - 4-5 x weekly - 3 sets - 6 reps - Supine Heel Slide  - 1 x daily - 7 x weekly - 3 sets - 15 reps - Active Straight Leg Raise with Quad Set  - 1 x daily - 4-5 x weekly - 3 sets - 8 reps  ASSESSMENT:  CLINICAL IMPRESSION: Continuing PT POC with progressive ROM and loading exercises. Pt with excellent carryover today with curb navigation with correct LE and QC sequencing indicating lower falls risk with community ADL navigation with addition of ankle weights this date. Still reliant on regular VC's throughout session for R knee flexion in swing phases of gait, step ups, and other gait exercises. Pt remains with limited knee flexion AROM and painful weightbearing limiting household and community ADL completion. Pt will benefit from skilled PT services to address these deficits to reduce pain and maximize functional capacity with walking, steps, and household ADL's.    OBJECTIVE IMPAIRMENTS: Abnormal gait, decreased balance, decreased knowledge of use of DME, decreased mobility, difficulty walking, decreased ROM, decreased strength, hypomobility, increased edema, impaired flexibility, and pain.   ACTIVITY LIMITATIONS: carrying, lifting, bending, standing, squatting, stairs, and transfers  PARTICIPATION LIMITATIONS: meal prep, cleaning, and community activity  PERSONAL FACTORS: Age, Time since onset of injury/illness/exacerbation, and 1-2 comorbidities: lymphedema, DM  are also affecting patient's functional outcome.   REHAB POTENTIAL: Fair multi- comorbidities  CLINICAL DECISION MAKING: Stable/uncomplicated  EVALUATION COMPLEXITY: Low   GOALS: Goals reviewed with patient? Yes  SHORT TERM GOALS: Target date:  01/20/23 Pt will be independent with HEP to improve R knee strength and decrease pain with functional  activities  Baseline: 12/23/22: HEP deferred to next session.; 02/17/23: 100% compliance.  Goal status: MET  LONG TERM GOALS: Target date: 04/13/22  Pt will improve FOTO to target score to demonstrate clinically significant improvement in functional mobility.  Baseline: 12/23/22: 59/73; 02/17/23: 46/73 Goal status: ON GOING  2. Pt will report <7/10 pain in R knee w/ prolonged ambulation to display clinically significant pain improvement. Baseline: 12/23/22: 9/10; 02/17/23: 8/10 NPS with walking Goal status: ON GOING  3. Pt will improve 5xSTS to </= 11.4 seconds to display improvements in LE strength and equal age- matched norms.  Baseline: 12/23/22: 19.85 seconds; 02/17/23: 13.93 seconds Goal status: ON GOING  4. Pt will improve to >/= 1.0 m/s to display improvements in LE strength and functional mobility to be categorized as a limited community ambulator.  Baseline: 12/23/22: 0.35m/s (household ambulator);  02/17/23: .43 m/s Goal status: ON GOING     PLAN:  PT FREQUENCY: 1-2x/week  PT DURATION: 8 weeks  PLANNED INTERVENTIONS: 97164- PT Re-evaluation, 97110-Therapeutic exercises, 97530- Therapeutic activity, 97112- Neuromuscular re-education, 97535- Self Care, 84696- Manual therapy, [phone]- Gait training, Patient/Family education, Balance training, Stair training, Joint mobilization, Joint manipulation, Cryotherapy, and Moist heat  PLAN FOR NEXT SESSION: knee flexion exercises, hip strengthening, gait training with curbs.   Delphia Grates. Fairly IV, PT, DPT Physical Therapist- [place]  [facility] [place] [facility]  3:47 PM, 03/24/23

## 2023-03-26 ENCOUNTER — Ambulatory Visit: Payer: 59

## 2023-03-31 ENCOUNTER — Ambulatory Visit: Payer: 59

## 2023-03-31 DIAGNOSIS — M6281 Muscle weakness (generalized): Secondary | ICD-10-CM

## 2023-03-31 DIAGNOSIS — M25561 Pain in right knee: Secondary | ICD-10-CM | POA: Diagnosis not present

## 2023-03-31 DIAGNOSIS — G8929 Other chronic pain: Secondary | ICD-10-CM

## 2023-03-31 NOTE — Therapy (Signed)
OUTPATIENT PHYSICAL THERAPY TREATMENT  Patient Name: Christine Lawson MRN: [id number] DOB:September 05, 1960, 63 y.o., female Today's Date: 03/31/2023  END OF SESSION:  PT End of Session - 03/31/23 1349     Visit Number 15    Number of Visits 17    Date for PT Re-Evaluation 04/14/23    Authorization Type [employer] Medicare    Progress Note Due on Visit 10    PT Start Time 1345    PT Stop Time 1430    PT Time Calculation (min) 45 min    Activity Tolerance Patient tolerated treatment well    Behavior During Therapy WFL for tasks assessed/performed             Past Medical History:  Diagnosis Date   Diabetes mellitus without complication (HCC)    Endometrial adenocarcinoma (HCC)    GERD (gastroesophageal reflux disease)    Hematochezia    Hyperlipidemia    Hypertension    IDA (iron deficiency anemia)    Inguinal lymphadenopathy 07/13/2015   Irritable bowel syndrome    Obesity    Osteoarthritis    Sleep apnea    Past Surgical History:  Procedure Laterality Date   ABDOMINAL HYSTERECTOMY  feb 2013   CARPAL TUNNEL RELEASE Left 03/20/2021   Procedure: CARPAL TUNNEL RELEASE ENDOSCOPIC;  Surgeon: Christena Flake, MD;  Location: [facility] ORS;  Service: Orthopedics;  Laterality: Left;   COLONOSCOPY WITH PROPOFOL N/A 08/02/2015   Procedure: COLONOSCOPY WITH PROPOFOL;  Surgeon: Scot Jun, MD;  Location: [facility] ENDOSCOPY;  Service: Endoscopy;  Laterality: N/A;   ESOPHAGOGASTRODUODENOSCOPY (EGD) WITH PROPOFOL N/A 08/02/2015   Procedure: ESOPHAGOGASTRODUODENOSCOPY (EGD) WITH PROPOFOL;  Surgeon: Scot Jun, MD;  Location: [facility] ENDOSCOPY;  Service: Endoscopy;  Laterality: N/A;   Patient Active Problem List   Diagnosis Date Noted   Hepatic steatosis 05/21/2019   Other proteinuria 01/25/2019   Vitamin D deficiency 04/13/2018   Gastroesophageal reflux disease with esophagitis 01/22/2018   Irritable bowel syndrome with diarrhea 04/03/2016   Lymphedema 01/17/2016   Venous (peripheral)  insufficiency 01/17/2016   Pain in limb 01/17/2016   Iron deficiency anemia due to chronic blood loss 01/09/2016   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   History of endometrial cancer 08/01/2015   Endometrial adenocarcinoma (HCC) 07/13/2015    Class: History of   Inguinal lymphadenopathy 07/13/2015   Long term current use of insulin (HCC) 12/12/2013   Microalbuminuria 12/12/2013   Diabetes (HCC) 07/28/2013   BP (high blood pressure) 07/28/2013   HLD (hyperlipidemia) 07/28/2013   Adiposity 07/28/2013   Obstructive apnea 07/28/2013   Arthritis, degenerative 07/28/2013   Apnea, sleep 07/28/2013   Thyroid nodule 07/28/2013    PCP: Barbette Reichmann, MD  REFERRING PROVIDER: Barbette Reichmann, MD  REFERRING DIAG:  Acute Pain, R Knee    THERAPY DIAG:  Chronic pain of right knee  Muscle weakness (generalized)  Rationale for Evaluation and Treatment: Rehabilitation  ONSET DATE: Several years ago   SUBJECTIVE:   SUBJECTIVE STATEMENT: Pt reports 8/10 pain NPS. States her R/L legs have buckled on her some.   PERTINENT HISTORY: Pt is a 14 y/p F with chronic R knee pain. Pt reports this pain began several years ago, however her most recent flare up was following a fall that occurred in June/ July of 2024 off of her bed. Pt well known to clinic and has had prior bouts of PT for same condition with known R knee OA. Pt reports 6-7/10 NPS in the medial aspect of the R  knee at rest, and 9/10 NPS with activity. Pt's aggravating factors include prolonged walking, standing, and curb/ stair negotiation. Pt describes her pain as sharp and is felt mostly with knee flexion. Massaging her R knee and taking Tylenol daily, temporarily help to relieve pt's R knee pain. Pt reports intermittent N/T down the RLE.   PAIN:  Are you having pain? 8/10 knee pain   PRECAUTIONS: None  RED FLAGS: None   WEIGHT BEARING RESTRICTIONS: No  FALLS:  Has patient fallen in last 6 months? Yes. Number of falls  2 June or July, 2024   LIVING ENVIRONMENT: Has following equipment at home: Dan Humphreys - 2 wheeled, [employer]  OCCUPATION: Retired  PLOF: Independent  PATIENT GOALS: "Try to get the leg to bend more and not pop".  NEXT MD VISIT: N/A  OBJECTIVE:  Note: Objective measures were completed at Evaluation unless otherwise noted.  DIAGNOSTIC FINDINGS: N/A  PATIENT SURVEYS:  FOTO 59/72  COGNITION: Overall cognitive status: Within functional limits for tasks assessed     SENSATION: LQNS: WFL  EDEMA:  Bilat LE edema noted 2/2 to pt dx with lymphedema.   MUSCLE LENGTH: Hamstrings (straight leg): Right 28 deg; Left 46 deg  POSTURE: rounded shoulders  PALPATION: TTP at medial and lateral aspect of anterior R and L knee.   LOWER EXTREMITY ROM:  Active ROM Right eval Left eval Left  02/17/23  Hip flexion     Hip extension     Hip abduction     Hip adduction     Hip internal rotation     Hip external rotation     Knee flexion 40* 91* 95  Knee extension 0 0 0  Ankle dorsiflexion     Ankle plantarflexion     Ankle inversion     Ankle eversion      (Blank rows = not tested)  LOWER EXTREMITY MMT:  MMT Right eval Left eval  Hip flexion 4- 4-  Hip extension    Hip abduction (screened in sitting)  4 4  Hip adduction (screened in sitting) 4 4  Hip internal rotation 4- 4-  Hip external rotation 4- 4-  Knee flexion 4 4  Knee extension 4 4  Ankle dorsiflexion 4 4  Ankle plantarflexion    Ankle inversion    Ankle eversion     (Blank rows = not tested)  LOWER EXTREMITY SPECIAL TESTS:  Hip special tests: Luisa Hart (FABER) test: negative and modified FADDIR: negative Knee special tests: Lachman Test: negative   FUNCTIONAL TESTS:  5 times sit to stand: 19.85 seconds 10 meter walk test: 0.83m/s seconds  GAIT: Assistive device utilized: Quad cane small base Level of assistance: Modified independence Comments: Pt notes decreased stance phase and knee flexion of the RLE,  significant RLE eversion and abduction also noted with ambulation. Circumductory in RLE swing phase due to limited R knee flexion.   TODAY'S TREATMENT:  DATE: 03/31/23  There.ex: Nu-Step L5 for 5 min seat 6 for R knee flexion AAROM  Curb navigation with 4" step: x6 reps. CGA.   Sled pushes 20#. 4x10 meters.   10# KB dead lifts to 9" step. 3x8.   Squats at TRX to 11.5" step + 2 airex pads: 3x6, CGA.  Seated RLE LAQ: 10#, AW: 3x12.   R Knee flexion in sitting: 100 degrees   PATIENT EDUCATION:  Education details: Continued focus of interventions in the context of recent   Person educated: Patient Education method: Medical illustrator Education comprehension: verbalized understanding and returned demonstration  HOME EXERCISE PROGRAM: Access Code: [email or web address] URL: [email or web address] Date: 02/17/2023 Prepared by: Ronnie Derby  Exercises - Supine Heel Slide  - 1 x daily - 7 x weekly - 3 sets - 15 reps - Active Straight Leg Raise with Quad Set  - 1 x daily - 4-5 x weekly - 3 sets - 8 reps - Mini Squat with Counter Support  - 1 x daily - 4-5 x weekly - 3 sets - 6 reps - Standing Hip Abduction with Counter Support  - 1 x daily - 4-5 x weekly - 3 sets - 8 reps - Seated Heel Toe Raises  - 1 x daily - 4-5 x weekly - 3 sets - 8 reps  Access Code: [email or web address] URL: [email or web address] Date: 01/14/2023 Prepared by: Ronnie Derby  Exercises - Sit to Stand Without Arm Support  - 1 x daily - 4-5 x weekly - 3 sets - 6 reps - Supine Heel Slide  - 1 x daily - 7 x weekly - 3 sets - 15 reps - Active Straight Leg Raise with Quad Set  - 1 x daily - 4-5 x weekly - 3 sets - 8 reps  ASSESSMENT:  CLINICAL IMPRESSION: Continuing PT POC with progressive ROM and loading exercises. Pt continues to display good to excellent carryover with  her curb navigation. Pt able to progress in her compound strength exercises this date and attaining 100 degrees in knee flexion by end of session. Pt continues to require seated rest breaks due to LE fatigue but is improving in general for tolerance for progressive exercises. Pt remains with limited knee flexion AROM and painful weightbearing limiting household and community ADL completion. Pt will benefit from skilled PT services to address these deficits to reduce pain and maximize functional capacity with walking, steps, and household ADL's.   OBJECTIVE IMPAIRMENTS: Abnormal gait, decreased balance, decreased knowledge of use of DME, decreased mobility, difficulty walking, decreased ROM, decreased strength, hypomobility, increased edema, impaired flexibility, and pain.   ACTIVITY LIMITATIONS: carrying, lifting, bending, standing, squatting, stairs, and transfers  PARTICIPATION LIMITATIONS: meal prep, cleaning, and community activity  PERSONAL FACTORS: Age, Time since onset of injury/illness/exacerbation, and 1-2 comorbidities: lymphedema, DM  are also affecting patient's functional outcome.   REHAB POTENTIAL: Fair multi- comorbidities  CLINICAL DECISION MAKING: Stable/uncomplicated  EVALUATION COMPLEXITY: Low   GOALS: Goals reviewed with patient? Yes  SHORT TERM GOALS: Target date: 01/20/23 Pt will be independent with HEP to improve R knee strength and decrease pain with functional activities  Baseline: 12/23/22: HEP deferred to next session.; 02/17/23: 100% compliance.  Goal status: MET  LONG TERM GOALS: Target date: 04/13/22  Pt will improve FOTO to target score to demonstrate clinically significant improvement in functional mobility.  Baseline: 12/23/22: 59/73; 02/17/23: 46/73 Goal status: ON GOING  2. Pt will report <7/10 pain in R knee w/ prolonged ambulation to display clinically significant pain  improvement. Baseline: 12/23/22: 9/10; 02/17/23: 8/10 NPS with walking Goal  status: ON GOING  3. Pt will improve 5xSTS to </= 11.4 seconds to display improvements in LE strength and equal age- matched norms.  Baseline: 12/23/22: 19.85 seconds; 02/17/23: 13.93 seconds Goal status: ON GOING  4. Pt will improve to >/= 1.0 m/s to display improvements in LE strength and functional mobility to be categorized as a limited community ambulator.  Baseline: 12/23/22: 0.41m/s (household ambulator);  02/17/23: .43 m/s Goal status: ON GOING     PLAN:  PT FREQUENCY: 1-2x/week  PT DURATION: 8 weeks  PLANNED INTERVENTIONS: 97164- PT Re-evaluation, 97110-Therapeutic exercises, 97530- Therapeutic activity, 97112- Neuromuscular re-education, 97535- Self Care, 36644- Manual therapy, [phone]- Gait training, Patient/Family education, Balance training, Stair training, Joint mobilization, Joint manipulation, Cryotherapy, and Moist heat  PLAN FOR NEXT SESSION: knee flexion exercises, hip strengthening, gait training with curbs.   Delphia Grates. Fairly IV, PT, DPT Physical Therapist- [place]  [id number]  2:31 PM, 03/31/23

## 2023-04-02 ENCOUNTER — Ambulatory Visit: Payer: 59

## 2023-04-02 DIAGNOSIS — M25561 Pain in right knee: Secondary | ICD-10-CM | POA: Diagnosis not present

## 2023-04-02 DIAGNOSIS — G8929 Other chronic pain: Secondary | ICD-10-CM

## 2023-04-02 DIAGNOSIS — M6281 Muscle weakness (generalized): Secondary | ICD-10-CM

## 2023-04-02 NOTE — Therapy (Signed)
OUTPATIENT PHYSICAL THERAPY TREATMENT  Patient Name: Christine Lawson MRN: [id number] DOB:1960-06-15, 63 y.o., female Today's Date: 04/02/2023  END OF SESSION:  PT End of Session - 04/02/23 1347     Visit Number 16    Number of Visits 17    Date for PT Re-Evaluation 04/14/23    Authorization Type [employer] Medicare    Progress Note Due on Visit 10    PT Start Time 1344    PT Stop Time 1428    PT Time Calculation (min) 44 min    Activity Tolerance Patient tolerated treatment well    Behavior During Therapy WFL for tasks assessed/performed             Past Medical History:  Diagnosis Date   Diabetes mellitus without complication (HCC)    Endometrial adenocarcinoma (HCC)    GERD (gastroesophageal reflux disease)    Hematochezia    Hyperlipidemia    Hypertension    IDA (iron deficiency anemia)    Inguinal lymphadenopathy 07/13/2015   Irritable bowel syndrome    Obesity    Osteoarthritis    Sleep apnea    Past Surgical History:  Procedure Laterality Date   ABDOMINAL HYSTERECTOMY  feb 2013   CARPAL TUNNEL RELEASE Left 03/20/2021   Procedure: CARPAL TUNNEL RELEASE ENDOSCOPIC;  Surgeon: Christena Flake, MD;  Location: [facility] ORS;  Service: Orthopedics;  Laterality: Left;   COLONOSCOPY WITH PROPOFOL N/A 08/02/2015   Procedure: COLONOSCOPY WITH PROPOFOL;  Surgeon: Scot Jun, MD;  Location: [facility] ENDOSCOPY;  Service: Endoscopy;  Laterality: N/A;   ESOPHAGOGASTRODUODENOSCOPY (EGD) WITH PROPOFOL N/A 08/02/2015   Procedure: ESOPHAGOGASTRODUODENOSCOPY (EGD) WITH PROPOFOL;  Surgeon: Scot Jun, MD;  Location: [facility] ENDOSCOPY;  Service: Endoscopy;  Laterality: N/A;   Patient Active Problem List   Diagnosis Date Noted   Hepatic steatosis 05/21/2019   Other proteinuria 01/25/2019   Vitamin D deficiency 04/13/2018   Gastroesophageal reflux disease with esophagitis 01/22/2018   Irritable bowel syndrome with diarrhea 04/03/2016   Lymphedema 01/17/2016   Venous (peripheral)  insufficiency 01/17/2016   Pain in limb 01/17/2016   Iron deficiency anemia due to chronic blood loss 01/09/2016   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   History of endometrial cancer 08/01/2015   Endometrial adenocarcinoma (HCC) 07/13/2015    Class: History of   Inguinal lymphadenopathy 07/13/2015   Long term current use of insulin (HCC) 12/12/2013   Microalbuminuria 12/12/2013   Diabetes (HCC) 07/28/2013   BP (high blood pressure) 07/28/2013   HLD (hyperlipidemia) 07/28/2013   Adiposity 07/28/2013   Obstructive apnea 07/28/2013   Arthritis, degenerative 07/28/2013   Apnea, sleep 07/28/2013   Thyroid nodule 07/28/2013    PCP: Barbette Reichmann, MD  REFERRING PROVIDER: Barbette Reichmann, MD  REFERRING DIAG:  Acute Pain, R Knee    THERAPY DIAG:  Chronic pain of right knee  Muscle weakness (generalized)  Rationale for Evaluation and Treatment: Rehabilitation  ONSET DATE: Several years ago   SUBJECTIVE:   SUBJECTIVE STATEMENT: Pt reports 9/10 pain NPS in her R knee.  PERTINENT HISTORY: Pt is a 63 y/o F with chronic R knee pain. Pt reports this pain began several years ago, however her most recent flare up was following a fall that occurred in June/ July of 2024 off of her bed. Pt well known to clinic and has had prior bouts of PT for same condition with known R knee OA. Pt reports 6-7/10 NPS in the medial aspect of the R knee at rest, and 9/10 NPS  with activity. Pt's aggravating factors include prolonged walking, standing, and curb/ stair negotiation. Pt describes her pain as sharp and is felt mostly with knee flexion. Massaging her R knee and taking Tylenol daily, temporarily help to relieve pt's R knee pain. Pt reports intermittent N/T down the RLE.   PAIN:  Are you having pain? 8/10 knee pain   PRECAUTIONS: None  RED FLAGS: None   WEIGHT BEARING RESTRICTIONS: No  FALLS:  Has patient fallen in last 6 months? Yes. Number of falls 2 June or July, 2024   LIVING  ENVIRONMENT: Has following equipment at home: Dan Humphreys - 2 wheeled, [employer]  OCCUPATION: Retired  PLOF: Independent  PATIENT GOALS: "Try to get the leg to bend more and not pop".  NEXT MD VISIT: N/A  OBJECTIVE:  Note: Objective measures were completed at Evaluation unless otherwise noted.  DIAGNOSTIC FINDINGS: N/A  PATIENT SURVEYS:  FOTO 59/72  COGNITION: Overall cognitive status: Within functional limits for tasks assessed     SENSATION: LQNS: WFL  EDEMA:  Bilat LE edema noted 2/2 to pt dx with lymphedema.   MUSCLE LENGTH: Hamstrings (straight leg): Right 28 deg; Left 46 deg  POSTURE: rounded shoulders  PALPATION: TTP at medial and lateral aspect of anterior R and L knee.   LOWER EXTREMITY ROM:  Active ROM Right eval Left eval Left  02/17/23  Hip flexion     Hip extension     Hip abduction     Hip adduction     Hip internal rotation     Hip external rotation     Knee flexion 40* 91* 95  Knee extension 0 0 0  Ankle dorsiflexion     Ankle plantarflexion     Ankle inversion     Ankle eversion      (Blank rows = not tested)  LOWER EXTREMITY MMT:  MMT Right eval Left eval  Hip flexion 4- 4-  Hip extension    Hip abduction (screened in sitting)  4 4  Hip adduction (screened in sitting) 4 4  Hip internal rotation 4- 4-  Hip external rotation 4- 4-  Knee flexion 4 4  Knee extension 4 4  Ankle dorsiflexion 4 4  Ankle plantarflexion    Ankle inversion    Ankle eversion     (Blank rows = not tested)  LOWER EXTREMITY SPECIAL TESTS:  Hip special tests: Luisa Hart (FABER) test: negative and modified FADDIR: negative Knee special tests: Lachman Test: negative   FUNCTIONAL TESTS:  5 times sit to stand: 19.85 seconds 10 meter walk test: 0.58m/s seconds  GAIT: Assistive device utilized: Quad cane small base Level of assistance: Modified independence Comments: Pt notes decreased stance phase and knee flexion of the RLE, significant RLE eversion and  abduction also noted with ambulation. Circumductory in RLE swing phase due to limited R knee flexion.   TODAY'S TREATMENT:  DATE: 04/02/23  There.ex: Nu-Step L5 for 5 min seat 6 for R knee flexion AAROM  Curb navigation with 6" step: 2x6 reps. CGA. Min VC's for correct LE sequencing.   X6 laps with 3 hurdle lateral step overs with BUE support for foot clearance, hip flexion and abduction strength.   20# KB dead lifts to 9" step. 3x8  Squats at TRX to 11.5" step + airex pad: 3x6, CGA.    R Knee flexion in sitting: 100 degrees post session   PATIENT EDUCATION:  Education details: Continued focus of interventions in the context of recent   Person educated: Patient Education method: Medical illustrator Education comprehension: verbalized understanding and returned demonstration  HOME EXERCISE PROGRAM: Access Code: [email or web address] URL: [email or web address] Date: 02/17/2023 Prepared by: Ronnie Derby  Exercises - Supine Heel Slide  - 1 x daily - 7 x weekly - 3 sets - 15 reps - Active Straight Leg Raise with Quad Set  - 1 x daily - 4-5 x weekly - 3 sets - 8 reps - Mini Squat with Counter Support  - 1 x daily - 4-5 x weekly - 3 sets - 6 reps - Standing Hip Abduction with Counter Support  - 1 x daily - 4-5 x weekly - 3 sets - 8 reps - Seated Heel Toe Raises  - 1 x daily - 4-5 x weekly - 3 sets - 8 reps  Access Code: [email or web address] URL: [email or web address] Date: 01/14/2023 Prepared by: Ronnie Derby  Exercises - Sit to Stand Without Arm Support  - 1 x daily - 4-5 x weekly - 3 sets - 6 reps - Supine Heel Slide  - 1 x daily - 7 x weekly - 3 sets - 15 reps - Active Straight Leg Raise with Quad Set  - 1 x daily - 4-5 x weekly - 3 sets - 8 reps  ASSESSMENT:  CLINICAL IMPRESSION: Continuing PT POC with progressive ROM and loading  exercises. Pt continues to display good to excellent carryover with her curb navigation with ability to asc/desc elevated stp from 4-6" to assist in strength and also reduced falls risk with community activities. Continuing to address proximal strength and foot clearance activities to assist in improved functional mobility. Knee pain continues to be grossly unchanged from eval but does appear stronger and with greater knee flexion AROM to 100 degrees. Pt will benefit from skilled PT services to address these deficits to reduce pain and maximize functional capacity with walking, steps, and household ADL's.   OBJECTIVE IMPAIRMENTS: Abnormal gait, decreased balance, decreased knowledge of use of DME, decreased mobility, difficulty walking, decreased ROM, decreased strength, hypomobility, increased edema, impaired flexibility, and pain.   ACTIVITY LIMITATIONS: carrying, lifting, bending, standing, squatting, stairs, and transfers  PARTICIPATION LIMITATIONS: meal prep, cleaning, and community activity  PERSONAL FACTORS: Age, Time since onset of injury/illness/exacerbation, and 1-2 comorbidities: lymphedema, DM  are also affecting patient's functional outcome.   REHAB POTENTIAL: Fair multi- comorbidities  CLINICAL DECISION MAKING: Stable/uncomplicated  EVALUATION COMPLEXITY: Low   GOALS: Goals reviewed with patient? Yes  SHORT TERM GOALS: Target date: 01/20/23 Pt will be independent with HEP to improve R knee strength and decrease pain with functional activities  Baseline: 12/23/22: HEP deferred to next session.; 02/17/23: 100% compliance.  Goal status: MET  LONG TERM GOALS: Target date: 04/13/22  Pt will improve FOTO to target score to demonstrate clinically significant improvement in functional mobility.  Baseline: 12/23/22: 59/73; 02/17/23: 46/73 Goal status: ON GOING  2. Pt will report <7/10  pain in R knee w/ prolonged ambulation to display clinically significant pain  improvement. Baseline: 12/23/22: 9/10; 02/17/23: 8/10 NPS with walking Goal status: ON GOING  3. Pt will improve 5xSTS to </= 11.4 seconds to display improvements in LE strength and equal age- matched norms.  Baseline: 12/23/22: 19.85 seconds; 02/17/23: 13.93 seconds Goal status: ON GOING  4. Pt will improve to >/= 1.0 m/s to display improvements in LE strength and functional mobility to be categorized as a limited community ambulator.  Baseline: 12/23/22: 0.66m/s (household ambulator);  02/17/23: .43 m/s Goal status: ON GOING     PLAN:  PT FREQUENCY: 1-2x/week  PT DURATION: 8 weeks  PLANNED INTERVENTIONS: 97164- PT Re-evaluation, 97110-Therapeutic exercises, 97530- Therapeutic activity, 97112- Neuromuscular re-education, 97535- Self Care, 96222- Manual therapy, [phone]- Gait training, Patient/Family education, Balance training, Stair training, Joint mobilization, Joint manipulation, Cryotherapy, and Moist heat  PLAN FOR NEXT SESSION: knee flexion exercises, hip strengthening, gait training with curbs.   Delphia Grates. Fairly IV, PT, DPT Physical Therapist- [place]  [facility]  2:41 PM, 04/02/23

## 2023-04-04 ENCOUNTER — Encounter: Payer: Self-pay | Admitting: Internal Medicine

## 2023-04-09 ENCOUNTER — Inpatient Hospital Stay: Payer: 59 | Admitting: Nurse Practitioner

## 2023-04-09 ENCOUNTER — Inpatient Hospital Stay: Payer: 59

## 2023-04-09 ENCOUNTER — Encounter: Payer: Self-pay | Admitting: Nurse Practitioner

## 2023-04-09 ENCOUNTER — Inpatient Hospital Stay: Payer: 59 | Attending: Internal Medicine

## 2023-04-09 VITALS — BP 157/63 | HR 94 | Temp 98.0°F | Ht 63.0 in | Wt 272.8 lb

## 2023-04-09 DIAGNOSIS — D500 Iron deficiency anemia secondary to blood loss (chronic): Secondary | ICD-10-CM | POA: Insufficient documentation

## 2023-04-09 DIAGNOSIS — N840 Polyp of corpus uteri: Secondary | ICD-10-CM | POA: Insufficient documentation

## 2023-04-09 DIAGNOSIS — Z90710 Acquired absence of both cervix and uterus: Secondary | ICD-10-CM | POA: Insufficient documentation

## 2023-04-09 DIAGNOSIS — Z833 Family history of diabetes mellitus: Secondary | ICD-10-CM | POA: Insufficient documentation

## 2023-04-09 DIAGNOSIS — Z68.41 Body mass index (BMI) pediatric, greater than or equal to 140% of the 95th percentile for age: Secondary | ICD-10-CM | POA: Insufficient documentation

## 2023-04-09 DIAGNOSIS — Z90722 Acquired absence of ovaries, bilateral: Secondary | ICD-10-CM | POA: Diagnosis not present

## 2023-04-09 DIAGNOSIS — Z79899 Other long term (current) drug therapy: Secondary | ICD-10-CM | POA: Diagnosis not present

## 2023-04-09 DIAGNOSIS — Z882 Allergy status to sulfonamides status: Secondary | ICD-10-CM | POA: Diagnosis not present

## 2023-04-09 DIAGNOSIS — C541 Malignant neoplasm of endometrium: Secondary | ICD-10-CM | POA: Diagnosis present

## 2023-04-09 DIAGNOSIS — I10 Essential (primary) hypertension: Secondary | ICD-10-CM | POA: Insufficient documentation

## 2023-04-09 DIAGNOSIS — K648 Other hemorrhoids: Secondary | ICD-10-CM | POA: Insufficient documentation

## 2023-04-09 LAB — CBC WITH DIFFERENTIAL (CANCER CENTER ONLY)
Abs Immature Granulocytes: 0.04 10*3/uL (ref 0.00–0.07)
Basophils Absolute: 0.1 10*3/uL (ref 0.0–0.1)
Basophils Relative: 1 %
Eosinophils Absolute: 0.3 10*3/uL (ref 0.0–0.5)
Eosinophils Relative: 3 %
HCT: 37.2 % (ref 36.0–46.0)
Hemoglobin: 11.8 g/dL — ABNORMAL LOW (ref 12.0–15.0)
Immature Granulocytes: 0 %
Lymphocytes Relative: 20 %
Lymphs Abs: 1.9 10*3/uL (ref 0.7–4.0)
MCH: 25.9 pg — ABNORMAL LOW (ref 26.0–34.0)
MCHC: 31.7 g/dL (ref 30.0–36.0)
MCV: 81.6 fL (ref 80.0–100.0)
Monocytes Absolute: 0.4 10*3/uL (ref 0.1–1.0)
Monocytes Relative: 4 %
Neutro Abs: 7.2 10*3/uL (ref 1.7–7.7)
Neutrophils Relative %: 72 %
Platelet Count: 384 10*3/uL (ref 150–400)
RBC: 4.56 MIL/uL (ref 3.87–5.11)
RDW: 14.2 % (ref 11.5–15.5)
WBC Count: 9.8 10*3/uL (ref 4.0–10.5)
nRBC: 0 % (ref 0.0–0.2)

## 2023-04-09 LAB — BASIC METABOLIC PANEL
Anion gap: 10 (ref 5–15)
BUN: 10 mg/dL (ref 6–20)
CO2: 28 mmol/L (ref 22–32)
Calcium: 8.6 mg/dL — ABNORMAL LOW (ref 8.9–10.3)
Chloride: 98 mmol/L (ref 98–111)
Creatinine, Ser: 0.78 mg/dL (ref 0.44–1.00)
GFR, Estimated: >60 mL/min (ref >60)
Glucose, Bld: 310 mg/dL — ABNORMAL HIGH (ref 70–99)
Potassium: 3.9 mmol/L (ref 3.5–5.1)
Sodium: 136 mmol/L (ref 135–145)

## 2023-04-09 LAB — IRON AND TIBC
Iron: 62 ug/dL (ref 28–170)
Saturation Ratios: 17 % (ref 10.4–31.8)
TIBC: 372 ug/dL (ref 250–450)
UIBC: 310 ug/dL

## 2023-04-09 LAB — FERRITIN: Ferritin: 68 ng/mL (ref 11–307)

## 2023-04-09 MED ORDER — GENTLE IRON 28-60-0.008-0.4 MG PO CAPS: 2.0000 | ORAL_CAPSULE | Freq: Every day | ORAL | 3 refills | Status: AC

## 2023-04-09 NOTE — Patient Instructions (Signed)
 Stop the iron  supplement you've been taking because it may be upsetting your stomach. Try Gentle Iron , a product from [employer]. You can ask the pharmacist to help you in locating it. It was a pleasure meeting you today and thank you for allowing me to participate in your care. -Tinnie Dawn, NP

## 2023-04-09 NOTE — Progress Notes (Signed)
 Fatigue/weakness: no Dyspena: no Light headedness: occasionally Blood in stool: no

## 2023-04-09 NOTE — Progress Notes (Signed)
 [facility] OFFICE PROGRESS NOTE  Patient Care Team: Sadie Manna, MD as PCP - General (Internal Medicine) Rennie Cindy SAUNDERS, MD as Consulting Physician (Oncology)   Cancer Staging  No matching staging information was found for the patient.  Oncology History  Endometrial adenocarcinoma (HCC)  07/13/2015 Initial Diagnosis   Endometrial adenocarcinoma (HCC)    Anemia of iron  deficiency unresponsive to oral iron  therapy - got IV Venofer 1 g in Feb 2015, then on oral Ferrous sulfate 1 tablet daily. (labs on 03/28/13 had showed hemoglobin 10.0, MCV 71.3, WBC 10,800, ANC 7990, ALC 1640, platelet count 564, ferritin 27, serum iron  33, iron  saturation low at 7%, TIBC 452. In June 2014 - hemoglobin 9.8, WBC 10,700, platelets 551, LFT unremarkable except alkaline phos of 120, creatinine 0.6). Stool Hemoccult was negative x2 in November 2014. Colonoscopy July 2013 had shown internal hemorrhoids.  Endometrial Cancer- (Dr. Lovetta)- 04/08/11- in office biopsy of endometrial polyp showed adenocarcinoma, well differentiated. She underwent TAH-BSO with DR. Thurnell Pinal and Dr. Lovetta at [facility], grade 1-2, stage Ia (invasion 4 of 20 mm, no LVSI). No adjuvant treatment. NED since.   INTERVAL HISTORY: Alone. Ambulating with a cane. Morbidly obese.  Christine Lawson 63 y.o. female pleasant patient with above history of iron  deficiency anemia and early stage endometrial cancer, who returns to clinic for follow up. Denies any neurologic complaints. Denies recent fevers or illnesses. Denies vaginal bleeding. Denies any easy bleeding or bruising. No melena or hematochezia. No pica or restless leg. Reports good appetite and denies weight loss. Denies chest pain. Denies any nausea, vomiting, constipation, or diarrhea. Denies urinary complaints. Patient offers no further specific complaints today.  Review of Systems  Constitutional:  Negative for chills, diaphoresis, fever,  malaise/fatigue and weight loss.  HENT:  Negative for nosebleeds and sore throat.   Eyes:  Negative for double vision.  Respiratory:  Negative for cough, hemoptysis, sputum production and wheezing.   Cardiovascular:  Negative for chest pain, palpitations, orthopnea and leg swelling.  Gastrointestinal:  Negative for abdominal pain, blood in stool, constipation, diarrhea, heartburn, melena, nausea and vomiting.  Genitourinary:  Negative for dysuria, frequency, hematuria and urgency.  Musculoskeletal:  Positive for joint pain. Negative for back pain and falls.  Skin: Negative.  Negative for itching and rash.  Neurological:  Negative for dizziness, tingling, focal weakness, weakness and headaches.  Endo/Heme/Allergies:  Does not bruise/bleed easily.  Psychiatric/Behavioral:  Negative for depression. The patient is not nervous/anxious and does not have insomnia.     PAST MEDICAL HISTORY :  Past Medical History:  Diagnosis Date   Diabetes mellitus without complication (HCC)    Endometrial adenocarcinoma (HCC)    GERD (gastroesophageal reflux disease)    Hematochezia    Hyperlipidemia    Hypertension    IDA (iron  deficiency anemia)    Inguinal lymphadenopathy 07/13/2015   Irritable bowel syndrome    Obesity    Osteoarthritis    Sleep apnea     PAST SURGICAL HISTORY :   Past Surgical History:  Procedure Laterality Date   ABDOMINAL HYSTERECTOMY  feb 2013   CARPAL TUNNEL RELEASE Left 03/20/2021   Procedure: CARPAL TUNNEL RELEASE ENDOSCOPIC;  Surgeon: Edie Norleen PARAS, MD;  Location: [facility] ORS;  Service: Orthopedics;  Laterality: Left;   COLONOSCOPY WITH PROPOFOL  N/A 08/02/2015   Procedure: COLONOSCOPY WITH PROPOFOL ;  Surgeon: Lamar ONEIDA Holmes, MD;  Location: [facility] ENDOSCOPY;  Service: Endoscopy;  Laterality: N/A;   ESOPHAGOGASTRODUODENOSCOPY (EGD) WITH PROPOFOL  N/A 08/02/2015  Procedure: ESOPHAGOGASTRODUODENOSCOPY (EGD) WITH PROPOFOL ;  Surgeon: Lamar ONEIDA Holmes, MD;  Location: [facility] ENDOSCOPY;   Service: Endoscopy;  Laterality: N/A;    FAMILY HISTORY :   Family History  Problem Relation Age of Onset   Diabetes Mother    Diabetes Father    Breast cancer Neg Hx     SOCIAL HISTORY:   Social History   Tobacco Use   Smoking status: Never   Smokeless tobacco: Never  Substance Use Topics   Alcohol use: No   Drug use: No    ALLERGIES:  is allergic to solifenacin, contrast media [iodinated contrast media], dicyclomine, isovue [iopamidol], and sulfa antibiotics.  MEDICATIONS:  Current Outpatient Medications  Medication Sig Dispense Refill   albuterol  (PROVENTIL  HFA;VENTOLIN  HFA) 108 (90 Base) MCG/ACT inhaler Inhale 1 puff into the lungs every 6 (six) hours as needed for wheezing or shortness of breath.     amLODipine (NORVASC) 10 MG tablet Take 10 mg by mouth daily.     ascorbic acid (VITAMIN C) 500 MG tablet Take 500 mg by mouth daily.     aspirin  81 MG EC tablet Take 81 mg by mouth daily. Swallow whole.     budesonide-formoterol (SYMBICORT) 80-4.5 MCG/ACT inhaler Inhale 2 puffs into the lungs 2 (two) times daily as needed (shortness of breath).     cholecalciferol (VITAMIN D) 1000 UNITS tablet Take 1,000 Units by mouth daily.     diclofenac sodium (VOLTAREN) 1 % GEL Apply 1 application topically 2 (two) times daily as needed for pain.     enalapril (VASOTEC) 5 MG tablet Take 5 mg by mouth daily.     Fe Bisgly-Vit C-Vit B12-FA (GENTLE IRON ) 28-60-0.008-0.4 MG CAPS Take 2 tablets by mouth daily. 90 capsule 3   HUMALOG KWIKPEN 100 UNIT/ML KwikPen Inject 26-60 Units into the skin 3 (three) times daily. Inject 26 units into the skin with breakfast and lunch and inject 60 units with supper     insulin glargine, 2 Unit Dial, (TOUJEO MAX SOLOSTAR) 300 UNIT/ML Solostar Pen Inject 120-140 Units into the skin daily.     montelukast (SINGULAIR) 10 MG tablet Take 10 mg by mouth daily.     pantoprazole (PROTONIX) 40 MG tablet Take 40 mg by mouth daily.     Peppermint Oil (IBGARD PO) Take 2  tablets by mouth daily.     rosuvastatin (CRESTOR) 5 MG tablet Take 5 mg by mouth daily.     VIBERZI 75 MG TABS Take 1 tablet by mouth 2 (two) times daily.     vitamin B-12 (CYANOCOBALAMIN) 500 MCG tablet Take 500 mcg by mouth daily.     No current facility-administered medications for this visit.    PHYSICAL EXAMINATION: ECOG PERFORMANCE STATUS: 0 - Asymptomatic  BP (!) 157/63 (BP Location: Left Arm, Patient Position: Sitting, Cuff Size: Large)   Pulse 94   Temp 98 F (36.7 C) (Tympanic)   Ht 5' 3 (1.6 m)   Wt 272 lb 12.8 oz (123.7 kg)   SpO2 97%   BMI 48.32 kg/m   Filed Weights   04/09/23 1344  Weight: 272 lb 12.8 oz (123.7 kg)     Physical Exam Vitals reviewed.  Constitutional:      Appearance: She is obese.     Comments: Unaccompanied.   HENT:     Head: Normocephalic and atraumatic.  Eyes:     Pupils: Pupils are equal, round, and reactive to light.  Pulmonary:     Effort: No respiratory distress.  Abdominal:     General: There is no distension.     Palpations: Abdomen is soft.     Tenderness: There is no abdominal tenderness. There is no guarding.  Musculoskeletal:     Comments: Cane for ambulation.   Skin:    General: Skin is warm.  Neurological:     Mental Status: She is alert and oriented to person, place, and time.  Psychiatric:        Mood and Affect: Mood and affect normal.        Behavior: Behavior normal.     LABORATORY DATA:  I have reviewed the data as listed  Lab Results  Component Value Date   WBC 9.8 04/09/2023   NEUTROABS 7.2 04/09/2023   HGB 11.8 (L) 04/09/2023   HCT 37.2 04/09/2023   MCV 81.6 04/09/2023   PLT 384 04/09/2023  Iron /TIBC/Ferritin/ %Sat    Component Value Date/Time   IRON  59 08/25/2022 1319   IRON  88 03/17/2014 1031   TIBC 342 08/25/2022 1319   TIBC 350 03/17/2014 1031   FERRITIN 88 08/25/2022 1319   FERRITIN 176 03/17/2014 1031   IRONPCTSAT 17 08/25/2022 1319   IRONPCTSAT 25 03/17/2014 1031     RADIOGRAPHIC STUDIES: I have personally reviewed the radiological images as listed and agreed with the findings in the report. No results found.   ASSESSMENT & PLAN:   Iron  deficiency anemia due to chronic blood loss Iron  deficiency anemia-question etiology. NOV 2022. Currently on oral iron  twice a day. Hemoglobin is stable at 11.8. Ferritin and iron  studies pending. She has GI upset with ferrous sulfate. Recommend rotating to gentle iron , ferrous bisglyccinate 28 mg. Take 2 daily. Hold off on feraheme  infusion today.    # History of endometrial cancer- diagnosed 2013, s/p TAH-BSO w/ staging. No adjuvant therapy. NED since. She continues annual surveillance pelvic exam with Dr. Lovetta. Asymptomatic of recurrent disease today. Continue surveillance with gyn.     # Mild thrombocytosis: likely secondary-  MPN panel- NEG. Today, plt normalized at 384. Monitor.     DISPOSITION: # NO infusion  #  follow-up in 6 moths- MD- iron  studies-Ferritin/CBC BMP; possible feraheme  IV- Dr.B    No problem-specific Assessment & Plan notes found for this encounter.  No orders of the defined types were placed in this encounter.  All questions were answered. The patient knows to call the clinic with any problems, questions or concerns.     Christine KANDICE Dawn, NP 04/09/2023

## 2023-04-09 NOTE — Addendum Note (Signed)
 Addended by: Vivi Grit on: 04/09/2023 02:35 PM   Modules accepted: Orders

## 2023-04-14 ENCOUNTER — Ambulatory Visit: Payer: 59

## 2023-04-16 ENCOUNTER — Ambulatory Visit: Payer: 59 | Attending: Internal Medicine

## 2023-06-03 NOTE — Congregational Nurse Program (Signed)
  Dept: [phone]   Congregational Nurse Program Note  Date of Encounter: 05/20/2023  Clinic visit with her twin sister, both have type II diabetes and have difficulty maintaining blood glucose within range.  AC dinner blood glucose 154, discussed times to take insulin and normal pre-meal blood glucose levels. Past Medical History: Past Medical History:  Diagnosis Date   Diabetes mellitus without complication (HCC)    Endometrial adenocarcinoma (HCC)    GERD (gastroesophageal reflux disease)    Hematochezia    Hyperlipidemia    Hypertension    IDA (iron deficiency anemia)    Inguinal lymphadenopathy 07/13/2015   Irritable bowel syndrome    Obesity    Osteoarthritis    Sleep apnea     Encounter Details:  Community Questionnaire - 05/20/23 1600       Questionnaire   Ask client: Do you give verbal consent for me to treat you today? Yes    Student Assistance N/A    Location Patient Served  [facility]    Encounter Setting CN site    Population Status Unknown   Lives with twin sister in an [employer] apartment   Insurance Medicaid    Insurance/Financial Assistance Referral N/A    Medication N/A    Medical Provider Yes    Screening Referrals Made N/A    Medical Referrals Made N/A    Medical Appointment Completed N/A    CNP Interventions Advocate/Support;Counsel;Educate;Spiritual Care    Screenings CN Performed Blood Pressure;Blood Glucose    ED Visit Averted N/A    Life-Saving Intervention Made N/A

## 2023-08-02 NOTE — Therapy (Signed)
 OUTPATIENT PHYSICAL THERAPY KNEE EVALUATION  Patient Name: Christine Lawson MRN: [id number] DOB:April 09, 1960, 63 y.o., female Today's Date: 08/04/2023  END OF SESSION:  PT End of Session - 08/04/23 1434     Visit Number 1    Number of Visits 17    Date for PT Re-Evaluation 09/29/23    Authorization Type [employer] Medicare    Progress Note Due on Visit 10    PT Start Time 1430    PT Stop Time 1515    PT Time Calculation (min) 45 min    Activity Tolerance Patient tolerated treatment well    Behavior During Therapy WFL for tasks assessed/performed             Past Medical History:  Diagnosis Date   Diabetes mellitus without complication (HCC)    Endometrial adenocarcinoma (HCC)    GERD (gastroesophageal reflux disease)    Hematochezia    Hyperlipidemia    Hypertension    IDA (iron  deficiency anemia)    Inguinal lymphadenopathy 07/13/2015   Irritable bowel syndrome    Obesity    Osteoarthritis    Sleep apnea    Past Surgical History:  Procedure Laterality Date   ABDOMINAL HYSTERECTOMY  feb 2013   CARPAL TUNNEL RELEASE Left 03/20/2021   Procedure: CARPAL TUNNEL RELEASE ENDOSCOPIC;  Surgeon: Elner Hahn, MD;  Location: [facility] ORS;  Service: Orthopedics;  Laterality: Left;   COLONOSCOPY WITH PROPOFOL  N/A 08/02/2015   Procedure: COLONOSCOPY WITH PROPOFOL ;  Surgeon: Cassie Click, MD;  Location: [facility] ENDOSCOPY;  Service: Endoscopy;  Laterality: N/A;   ESOPHAGOGASTRODUODENOSCOPY (EGD) WITH PROPOFOL  N/A 08/02/2015   Procedure: ESOPHAGOGASTRODUODENOSCOPY (EGD) WITH PROPOFOL ;  Surgeon: Cassie Click, MD;  Location: [facility] ENDOSCOPY;  Service: Endoscopy;  Laterality: N/A;   Patient Active Problem List   Diagnosis Date Noted   Hepatic steatosis 05/21/2019   Other proteinuria 01/25/2019   Vitamin D deficiency 04/13/2018   Gastroesophageal reflux disease with esophagitis 01/22/2018   Irritable bowel syndrome with diarrhea 04/03/2016   Lymphedema 01/17/2016   Venous (peripheral)  insufficiency 01/17/2016   Pain in limb 01/17/2016   Iron  deficiency anemia due to chronic blood loss 01/09/2016   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   History of endometrial cancer 08/01/2015   Endometrial adenocarcinoma (HCC) 07/13/2015    Class: History of   Inguinal lymphadenopathy 07/13/2015   Long term current use of insulin (HCC) 12/12/2013   Microalbuminuria 12/12/2013   Diabetes (HCC) 07/28/2013   BP (high blood pressure) 07/28/2013   HLD (hyperlipidemia) 07/28/2013   Adiposity 07/28/2013   Obstructive apnea 07/28/2013   Arthritis, degenerative 07/28/2013   Apnea, sleep 07/28/2013   Thyroid nodule 07/28/2013    PCP: Antonio Baumgarten, MD  REFERRING PROVIDER: Antonio Baumgarten, MD  REFERRING DIAG:  M17.11 (ICD-10-CM) - Unilateral primary osteoarthritis, right knee  M25.562 (ICD-10-CM) - Pain in left knee  R26.2 (ICD-10-CM) - Ambulatory dysfunction  R29.898 (ICD-10-CM) - Bilateral leg weakness  Z91.81 (ICD-10-CM) - At risk for fall due to comorbid condition   RATIONALE FOR EVALUATION AND TREATMENT: Rehabilitation  THERAPY DIAG: Muscle weakness (generalized)  Chronic pain of left knee  Chronic pain of right knee  Other abnormalities of gait and mobility  ONSET DATE: Chronic Knee Pain  FOLLOW-UP APPT SCHEDULED WITH REFERRING PROVIDER: Didn't Address    SUBJECTIVE:  SUBJECTIVE STATEMENT:  Pt is a pleasant 63 y/o F presenting to OPPT w/ chronic R knee pain.  PERTINENT HISTORY:   Pt is a 63 y/p F with chronic R knee pain. Pt reports that she hasn't had a fall.  Pt well known to clinic and has had prior bouts of PT for same condition with known R knee OA. She reports intermittent bouts of LE weakness in RLE/LLE depending on how long she stands. Pt reports 6-7/10 NPS in the medial  aspect of the R knee at rest, and 9/10 NPS with activity. Pt's aggravating factors include prolonged walking, standing, sitting for a long time, step and curb navigation. Relieving factors ice modalities, some gentle ROM exercises. Pt reports intermittent N/T down the RLE. She reports independence with walking and use of [employer] but requires assistance from aid for basic ADLs.      PAIN:   Pain Intensity: Present: 7/10, Best: 7/10, Worst: 9/10 Pain location:  R/L Knee  Pain quality: constant, sharp, and aching  Radiating pain: Yes  Swelling: Yes  Popping, catching, locking: Yes  Numbness/Tingling: Yes Focal weakness or buckling: Yes 24-hour pain behavior: Activity Dependent  History of prior back, hip, or knee injury, pain, surgery, or therapy: Yes Imaging: No  Prior level of function: Independent with household mobility with device, Needs assistance with ADLs, and Needs assistance with homemaking  Red flags: Negative for personal history of cancer, chills/fever, night sweats, nausea, vomiting, unexplained weight gain/loss, unrelenting pain  PRECAUTIONS: Fall  WEIGHT BEARING RESTRICTIONS: No  FALLS: Has patient fallen in last 6 months? No  Living Environment Lives with: lives with their family and lives in an assisted living facility Lives in: House/apartment  Hobbies: Hanging out with sister, reading, TV, walking  Patient Goals: "    OBJECTIVE:   Patient Surveys  LEFS: (See below at goals)  Cognition Patient is oriented to person, place, and time.  Recent memory is intact.  Remote memory is intact.  Attention span and concentration are intact.  Expressive speech is intact.  Patient's fund of knowledge is within normal limits for educational level.    Gross Musculoskeletal Assessment Tremor: None Bulk: Normal Tone: Normal Edema: Significant swelling in bilateral lower leg, medial/lateral joint line of the knee  GAIT: Distance walked: 7m Assistive device  utilized: Quad cane large base Level of assistance: Modified independence Comments: Decreased velocity, decrease step length bilaterally, circumduction in RLE, decreased knee flexion bilaterally   Posture: Seated: Rounded Shoulders, Forward posture, increased kyphosis, LE positioned in hip abduction and external rotation  AROM AROM (Normal range in degrees) AROM  Hip Right Left  Flexion (125)    Extension (15)    Abduction (40)    Adduction     Internal Rotation (45)    External Rotation (45)        Knee    Flexion (135) 75 83  Extension (0) 0 0      Ankle    Dorsiflexion (20)    Plantarflexion (50)    Inversion (35)    Eversion (15    (* = pain; Blank rows = not tested)  LE MMT: MMT (out of 5) Right Left  Hip flexion 3+ 3+  Hip extension    Hip abduction    Hip adduction    Hip internal rotation 3+ 3+  Hip external rotation 3+ 3+  Knee flexion 4 4  Knee extension 4 4  Ankle dorsiflexion 5 5  Ankle plantarflexion 5 5  Ankle  inversion    Ankle eversion    (* = pain; Blank rows = not tested)  Sensation Grossly intact to light touch bilateral LEs as determined by testing dermatomes L2-S2. Proprioception, and hot/cold testing deferred on this date.  Reflexes Deferred  Muscle Length Hamstring: Deferred Quadriceps: Deferred  Palpation Location LEFT  RIGHT           Quadriceps 1 1  Medial Hamstrings 1 1  Lateral Hamstrings    Lateral Hamstring tendon 1 1  Medial Hamstring tendon 1 1  Quadriceps tendon    Patella    Patellar Tendon 1 1  Tibial Tuberosity    Medial joint line 2 2  Lateral joint line 2 2  MCL 1 1  LCL 1 1  Adductor Tubercle    Pes Anserine tendon    Infrapatellar fat pad    Fibular head    Popliteal fossa    (Blank rows = not tested) Graded on 0-4 scale (0 = no pain, 1 = pain, 2 = pain with wincing/grimacing/flinching, 3 = pain with withdrawal, 4 = unwilling to allow palpation), (Blank rows = not tested)  Passive Accessory  Motion Deferred   SPECIAL TESTS  MCL: Valgus Stress (30 degrees flexion): R: Positive for concordant pain L: Negative  LCL: Varus Stress (30 degrees flexion): R: Positive for concordant pain L: Negavtive  Functional Tests:   5XSTS: 12.67s  :  Stairs: Level of Assistance: Complete Independence, [employer] Stair Negotiation Technique: Step to Pattern with Bilateral Rails Number of Stairs: 4   TODAY'S TREATMENT: DATE: 08/04/2023:  Eval Only.  PATIENT EDUCATION:  Education details: HEP, POC, Prognosis Person educated: Patient Education method: Explanation, Demonstration, and Handouts Education comprehension: verbalized understanding and returned demonstration   HOME EXERCISE PROGRAM:   Access Code: [email or web address] URL: [email or web address] Date: 08/04/2023 Prepared by: Veryl Gottron Eliezer Khawaja  Exercises - Supine Heel Slide with Strap  - 1 x daily - 7 x weekly - 3 sets - 10 reps - Seated Long Arc Quad  - 1 x daily - 7 x weekly - 3 sets - 12-15 reps - Seated Heel Raise  - 1 x daily - 7 x weekly - 3 sets - 12-15 reps - Seated Hamstring Stretch  - 1 x daily - 7 x weekly - 3 sets - 10-15s hold    ASSESSMENT:  CLINICAL IMPRESSION: Christine Lawson is a 63 y.o. pleasant female seen for managemnt of chronic bilateral knee pain. Patient presents with deficits in activity tolerance, LE strength and significant decreased knee ROM (See above at ROM and MMT). Obs significant for bilateral swelling in ankles and knees. Gait presents with deficits in decreased stance time in L leg and antalgic pattern; pt uses quad based cane in order to maintain balance. 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 She self reported 30 / 80 = 37.5 % on the LEFS indicating significant disability to LE function. TTP along R medial and lateral knee  joint lines, R MCL and LCL and distal hamstring tendons (bilat). Pt with significant muscle spasm in R hamstring muscle  belly; mitigated with passive stretching. Initial HEP provided focused on knee strengthening and knee flexion ROM; education provided to family and patient. PT education on importance of elevation and ankle pumps in order to reduce lower leg swelling.  Based on the patients performance she will benefit from skilled physical therapy in order to improve functional mobility and QoL.  OBJECTIVE IMPAIRMENTS: Abnormal gait, decreased activity  tolerance, decreased balance, decreased endurance, decreased mobility, difficulty walking, decreased ROM, decreased strength, increased edema, increased muscle spasms, impaired flexibility, and pain.   ACTIVITY LIMITATIONS: carrying, lifting, bending, standing, squatting, sleeping, stairs, transfers, bed mobility, bathing, toileting, dressing, and locomotion level  PARTICIPATION LIMITATIONS: cleaning, driving, shopping, community activity, and yard work  PERSONAL FACTORS: Age, Behavior pattern, Past/current experiences, Time since onset of injury/illness/exacerbation, and 3+ comorbidities: HTN, DM, Venous insufficiency are also affecting patient's functional outcome.   REHAB POTENTIAL: Fair Multiple chronic co morbidities   CLINICAL DECISION MAKING: Evolving/moderate complexity  EVALUATION COMPLEXITY: Moderate   GOALS: Goals reviewed with patient? Yes  SHORT TERM GOALS: Target date: 09/01/2023  Pt will be independent with HEP to improve strength and decrease knee pain to improve pain-free function at home and work. Baseline: Initial Provided Goal status: INITIAL   LONG TERM GOALS: Target date: 09/29/2023  Pt will increase by at least 0.13 m/s in order to demonstrate clinically significant improvement in community ambulation.   Baseline:  Goal status: INITIAL  2.  Pt will decrease worst knee pain by at least 3 points on the NPRS in order to demonstrate clinically significant reduction in knee pain. Baseline: 9/10 NPS Goal status: INITIAL  3.  Pt  will decrease LEFS score by at least 9 points in order demonstrate clinically significant reduction in knee pain/disability.       Baseline: 30 / 80 = 37.5 % Goal status: INITIAL  4.  Pt will increase strength of bilateral Hip Flexion, IR/ER, knee ext/flex by at least 1/2 MMT grade in order to demonstrate improvement in strength and function  Baseline: 08/04/2023: R/L   Hip flexion 3+ 3+  Hip extension    Hip abduction    Hip adduction    Hip internal rotation 3+ 3+  Hip external rotation 3+ 3+  Knee flexion 4 4  Knee extension 4 4   Goal status: INITIAL   PLAN: PT FREQUENCY: 1-2x/week  PT DURATION: 8 weeks  PLANNED INTERVENTIONS: Therapeutic exercises, Therapeutic activity, Neuromuscular re-education, Balance training, Gait training, Patient/Family education, Self Care, Joint mobilization, Joint manipulation, Vestibular training, Canalith repositioning, Orthotic/Fit training, DME instructions, Dry Needling, Electrical stimulation, Spinal manipulation, Spinal mobilization, Cryotherapy, Moist heat, Taping, Traction, Manual therapy, and Re-evaluation.  PLAN FOR NEXT SESSION: Review HEP; Initiate LE strength; LE mobility    Satira Curet PT, DPT Physical Therapist- [facility]  08/04/2023, 11:09 PM

## 2023-08-04 ENCOUNTER — Ambulatory Visit: Attending: Internal Medicine

## 2023-08-04 DIAGNOSIS — M25561 Pain in right knee: Secondary | ICD-10-CM | POA: Insufficient documentation

## 2023-08-04 DIAGNOSIS — R2689 Other abnormalities of gait and mobility: Secondary | ICD-10-CM | POA: Insufficient documentation

## 2023-08-04 DIAGNOSIS — M6281 Muscle weakness (generalized): Secondary | ICD-10-CM | POA: Diagnosis present

## 2023-08-04 DIAGNOSIS — G8929 Other chronic pain: Secondary | ICD-10-CM | POA: Diagnosis present

## 2023-08-04 DIAGNOSIS — M25562 Pain in left knee: Secondary | ICD-10-CM | POA: Diagnosis present

## 2023-08-12 ENCOUNTER — Encounter

## 2023-08-19 ENCOUNTER — Encounter

## 2023-08-25 ENCOUNTER — Ambulatory Visit

## 2023-08-25 DIAGNOSIS — R2689 Other abnormalities of gait and mobility: Secondary | ICD-10-CM

## 2023-08-25 DIAGNOSIS — M6281 Muscle weakness (generalized): Secondary | ICD-10-CM

## 2023-08-25 DIAGNOSIS — G8929 Other chronic pain: Secondary | ICD-10-CM

## 2023-08-25 NOTE — Therapy (Signed)
 OUTPATIENT PHYSICAL THERAPY KNEE TREATMENT  Patient Name: Christine Lawson MRN: [id number] DOB:Jul 23, 1960, 63 y.o., female Today's Date: 08/25/2023  END OF SESSION:  PT End of Session - 08/25/23 1515     Visit Number 2    Number of Visits 17    Date for PT Re-Evaluation 09/29/23    Authorization Type [employer]    Progress Note Due on Visit 10    PT Start Time 1515    PT Stop Time 1600    PT Time Calculation (min) 45 min    Activity Tolerance Patient tolerated treatment well    Behavior During Therapy WFL for tasks assessed/performed          Past Medical History:  Diagnosis Date   Diabetes mellitus without complication (HCC)    Endometrial adenocarcinoma (HCC)    GERD (gastroesophageal reflux disease)    Hematochezia    Hyperlipidemia    Hypertension    IDA (iron  deficiency anemia)    Inguinal lymphadenopathy 07/13/2015   Irritable bowel syndrome    Obesity    Osteoarthritis    Sleep apnea    Past Surgical History:  Procedure Laterality Date   ABDOMINAL HYSTERECTOMY  feb 2013   CARPAL TUNNEL RELEASE Left 03/20/2021   Procedure: CARPAL TUNNEL RELEASE ENDOSCOPIC;  Surgeon: Edie Norleen PARAS, MD;  Location: [facility] ORS;  Service: Orthopedics;  Laterality: Left;   COLONOSCOPY WITH PROPOFOL  N/A 08/02/2015   Procedure: COLONOSCOPY WITH PROPOFOL ;  Surgeon: Lamar ONEIDA Holmes, MD;  Location: [facility] ENDOSCOPY;  Service: Endoscopy;  Laterality: N/A;   ESOPHAGOGASTRODUODENOSCOPY (EGD) WITH PROPOFOL  N/A 08/02/2015   Procedure: ESOPHAGOGASTRODUODENOSCOPY (EGD) WITH PROPOFOL ;  Surgeon: Lamar ONEIDA Holmes, MD;  Location: [facility] ENDOSCOPY;  Service: Endoscopy;  Laterality: N/A;   Patient Active Problem List   Diagnosis Date Noted   Hepatic steatosis 05/21/2019   Other proteinuria 01/25/2019   Vitamin D deficiency 04/13/2018   Gastroesophageal reflux disease with esophagitis 01/22/2018   Irritable bowel syndrome with diarrhea 04/03/2016   Lymphedema 01/17/2016   Venous (peripheral)  insufficiency 01/17/2016   Pain in limb 01/17/2016   Iron  deficiency anemia due to chronic blood loss 01/09/2016   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   History of endometrial cancer 08/01/2015   Endometrial adenocarcinoma (HCC) 07/13/2015    Class: History of   Inguinal lymphadenopathy 07/13/2015   Long term current use of insulin (HCC) 12/12/2013   Microalbuminuria 12/12/2013   Diabetes (HCC) 07/28/2013   BP (high blood pressure) 07/28/2013   HLD (hyperlipidemia) 07/28/2013   Adiposity 07/28/2013   Obstructive apnea 07/28/2013   Arthritis, degenerative 07/28/2013   Apnea, sleep 07/28/2013   Thyroid nodule 07/28/2013    PCP: Sadie Manna, MD  REFERRING PROVIDER: Sadie Manna, MD  REFERRING DIAG:  M17.11 (ICD-10-CM) - Unilateral primary osteoarthritis, right knee  M25.562 (ICD-10-CM) - Pain in left knee  R26.2 (ICD-10-CM) - Ambulatory dysfunction  R29.898 (ICD-10-CM) - Bilateral leg weakness  Z91.81 (ICD-10-CM) - At risk for fall due to comorbid condition   RATIONALE FOR EVALUATION AND TREATMENT: Rehabilitation  THERAPY DIAG: Muscle weakness (generalized)  Chronic pain of left knee  Chronic pain of right knee  Other abnormalities of gait and mobility  ONSET DATE: Chronic Knee Pain  FOLLOW-UP APPT SCHEDULED WITH REFERRING PROVIDER: Didn't Address    SUBJECTIVE:  SUBJECTIVE STATEMENT:  Pt is a pleasant 63 y/o F presenting to OPPT w/ chronic R knee pain.  PERTINENT HISTORY:   Pt is a 63 y/p F with chronic R knee pain. Pt reports that she hasn't had a fall.  Pt well known to clinic and has had prior bouts of PT for same condition with known R knee OA. She reports intermittent bouts of LE weakness in RLE/LLE depending on how long she stands. Pt reports 6-7/10 NPS in the medial  aspect of the R knee at rest, and 9/10 NPS with activity. Pt's aggravating factors include prolonged walking, standing, sitting for a long time, step and curb navigation. Relieving factors ice modalities, some gentle ROM exercises. Pt reports intermittent N/T down the RLE. She reports independence with walking and use of [employer] but requires assistance from aid for basic ADLs.      PAIN:   Pain Intensity: Present: 7/10, Best: 7/10, Worst: 9/10 Pain location:  R/L Knee  Pain quality: constant, sharp, and aching  Radiating pain: Yes  Swelling: Yes  Popping, catching, locking: Yes  Numbness/Tingling: Yes Focal weakness or buckling: Yes 24-hour pain behavior: Activity Dependent  History of prior back, hip, or knee injury, pain, surgery, or therapy: Yes Imaging: No  Prior level of function: Independent with household mobility with device, Needs assistance with ADLs, and Needs assistance with homemaking  Red flags: Negative for personal history of cancer, chills/fever, night sweats, nausea, vomiting, unexplained weight gain/loss, unrelenting pain  PRECAUTIONS: Fall  WEIGHT BEARING RESTRICTIONS: No  FALLS: Has patient fallen in last 6 months? No  Living Environment Lives with: lives with their family and lives in an assisted living facility Lives in: House/apartment  Hobbies: Hanging out with sister, reading, TV, walking  Patient Goals:     OBJECTIVE:   Patient Surveys  LEFS: (See below at goals)  Cognition Patient is oriented to person, place, and time.  Recent memory is intact.  Remote memory is intact.  Attention span and concentration are intact.  Expressive speech is intact.  Patient's fund of knowledge is within normal limits for educational level.    Gross Musculoskeletal Assessment Tremor: None Bulk: Normal Tone: Normal Edema: Significant swelling in bilateral lower leg, medial/lateral joint line of the knee  GAIT: Distance walked: 108m Assistive device  utilized: Quad cane large base Level of assistance: Modified independence Comments: Decreased velocity, decrease step length bilaterally, circumduction in RLE, decreased knee flexion bilaterally   Posture: Seated: Rounded Shoulders, Forward posture, increased kyphosis, LE positioned in hip abduction and external rotation  AROM AROM (Normal range in degrees) AROM  Hip Right Left  Flexion (125)    Extension (15)    Abduction (40)    Adduction     Internal Rotation (45)    External Rotation (45)        Knee    Flexion (135) 75 83  Extension (0) 0 0      Ankle    Dorsiflexion (20)    Plantarflexion (50)    Inversion (35)    Eversion (15    (* = pain; Blank rows = not tested)  LE MMT: MMT (out of 5) Right Left  Hip flexion 3+ 3+  Hip extension    Hip abduction    Hip adduction    Hip internal rotation 3+ 3+  Hip external rotation 3+ 3+  Knee flexion 4 4  Knee extension 4 4  Ankle dorsiflexion 5 5  Ankle plantarflexion 5 5  Ankle  inversion    Ankle eversion    (* = pain; Blank rows = not tested)  Sensation Grossly intact to light touch bilateral LEs as determined by testing dermatomes L2-S2. Proprioception, and hot/cold testing deferred on this date.  Reflexes Deferred  Muscle Length Hamstring: Deferred Quadriceps: Deferred  Palpation Location LEFT  RIGHT           Quadriceps 1 1  Medial Hamstrings 1 1  Lateral Hamstrings    Lateral Hamstring tendon 1 1  Medial Hamstring tendon 1 1  Quadriceps tendon    Patella    Patellar Tendon 1 1  Tibial Tuberosity    Medial joint line 2 2  Lateral joint line 2 2  MCL 1 1  LCL 1 1  Adductor Tubercle    Pes Anserine tendon    Infrapatellar fat pad    Fibular head    Popliteal fossa    (Blank rows = not tested) Graded on 0-4 scale (0 = no pain, 1 = pain, 2 = pain with wincing/grimacing/flinching, 3 = pain with withdrawal, 4 = unwilling to allow palpation), (Blank rows = not tested)  Passive Accessory  Motion Deferred   SPECIAL TESTS  MCL: Valgus Stress (30 degrees flexion): R: Positive for concordant pain L: Negative  LCL: Varus Stress (30 degrees flexion): R: Positive for concordant pain L: Negavtive  Functional Tests:   5XSTS: 12.67s  :  Stairs: Level of Assistance: Complete Independence, [employer] Stair Negotiation Technique: Step to Pattern with Bilateral Rails Number of Stairs: 4   TODAY'S TREATMENT: DATE: 08/25/2023:  Subjective: Patient reports 8/10 NPS in the bilateral knees. She has been doing some of the HEP at home in order to help with knee stiffness. Arrived with family. No further questions or concerns at start of session.   Therapeutic Exercise:   NuStep L5-1 x 5 min x UE/LE (Seat 8) for LE warm up, endurance and strength; PT manually adjusted resistance throughout bout.    Hip Matrix Cable Machine   Hip Flexion     R/L: 1 x 10 10#, 2 x 10 15#   Seated LAQ    R/L: 2 x 10 5# AW    Hamstring Curl    L: 1 x 10, Blue TB, 1 x 5 Blue TB (muscle spasm)     Seated Hamstring Stretch    30s/bout x 5 in order to improve ROM and tissue extensibility  Therapeutic Activity    Forward Step Up/Down at 6 Step (BUE Support)    2 x 20 steps    Sit to stand from mat table (19)    1 x 10 with 3Kg Med ball    1 x 10 with 2 Kg MB throw   1 x 10 with 3 Kg MB throw   PATIENT EDUCATION:  Education details: HEP, POC, Prognosis Person educated: Patient Education method: Explanation, Demonstration, and Handouts Education comprehension: verbalized understanding and returned demonstration   HOME EXERCISE PROGRAM:   Access Code: [email or web address] URL: [email or web address] Date: 08/04/2023 Prepared by: Lonni Taela Charbonneau  Exercises - Supine Heel Slide with Strap  - 1 x daily - 7 x weekly - 3 sets - 10 reps - Seated Long Arc Quad  - 1 x daily - 7 x weekly - 3 sets - 12-15 reps - Seated Heel Raise  - 1 x daily - 7 x weekly - 3 sets - 12-15 reps - Seated  Hamstring Stretch  - 1 x daily - 7 x weekly - 3  sets - 10-15s hold    ASSESSMENT:  CLINICAL IMPRESSION: Patient returns to OPPT for first f/u in management of bilateral knee pain. PT focused on increasing functional strength , cardiovascular endurance and knee ROM. Left hamstring spasm was observed during curls, requiring a reduction in volume. Flexibility was addressed through prolonged seated hamstring stretching. Functional training included step-up/downs and loaded sit-to-stand tasks to enhance transitional movement capacity and improve ADL performance. Patient tolerated session with mild muscle irritability noted but completed all prescribed tasks with encouragement and cueing. Based on today's performance, pt will continue to benefit from skilled PT in order to facilitate return to PLOF and improve QoL.   OBJECTIVE IMPAIRMENTS: Abnormal gait, decreased activity tolerance, decreased balance, decreased endurance, decreased mobility, difficulty walking, decreased ROM, decreased strength, increased edema, increased muscle spasms, impaired flexibility, and pain.   ACTIVITY LIMITATIONS: carrying, lifting, bending, standing, squatting, sleeping, stairs, transfers, bed mobility, bathing, toileting, dressing, and locomotion level  PARTICIPATION LIMITATIONS: cleaning, driving, shopping, community activity, and yard work  PERSONAL FACTORS: Age, Behavior pattern, Past/current experiences, Time since onset of injury/illness/exacerbation, and 3+ comorbidities: HTN, DM, Venous insufficiency are also affecting patient's functional outcome.   REHAB POTENTIAL: Fair Multiple chronic co morbidities   CLINICAL DECISION MAKING: Evolving/moderate complexity  EVALUATION COMPLEXITY: Moderate   GOALS: Goals reviewed with patient? Yes  SHORT TERM GOALS: Target date: 09/22/2023  Pt will be independent with HEP to improve strength and decrease knee pain to improve pain-free function at home and work. Baseline:  Initial Provided Goal status: INITIAL   LONG TERM GOALS: Target date: 10/20/2023  Pt will increase by at least 0.13 m/s in order to demonstrate clinically significant improvement in community ambulation.   Baseline:  Goal status: INITIAL  2.  Pt will decrease worst knee pain by at least 3 points on the NPRS in order to demonstrate clinically significant reduction in knee pain. Baseline: 9/10 NPS Goal status: INITIAL  3.  Pt will decrease LEFS score by at least 9 points in order demonstrate clinically significant reduction in knee pain/disability.       Baseline: 30 / 80 = 37.5 % Goal status: INITIAL  4.  Pt will increase strength of bilateral Hip Flexion, IR/ER, knee ext/flex by at least 1/2 MMT grade in order to demonstrate improvement in strength and function  Baseline: 08/04/2023: R/L   Hip flexion 3+ 3+  Hip extension    Hip abduction    Hip adduction    Hip internal rotation 3+ 3+  Hip external rotation 3+ 3+  Knee flexion 4 4  Knee extension 4 4   Goal status: INITIAL   PLAN: PT FREQUENCY: 1-2x/week  PT DURATION: 8 weeks  PLANNED INTERVENTIONS: Therapeutic exercises, Therapeutic activity, Neuromuscular re-education, Balance training, Gait training, Patient/Family education, Self Care, Joint mobilization, Joint manipulation, Vestibular training, Canalith repositioning, Orthotic/Fit training, DME instructions, Dry Needling, Electrical stimulation, Spinal manipulation, Spinal mobilization, Cryotherapy, Moist heat, Taping, Traction, Manual therapy, and Re-evaluation.  PLAN FOR NEXT SESSION: Review HEP; Initiate LE strength; LE mobility    Lonni Pall PT, DPT Physical Therapist- [place]  [facility]  08/25/2023, 4:10 PM

## 2023-09-02 ENCOUNTER — Ambulatory Visit

## 2023-09-08 ENCOUNTER — Ambulatory Visit: Attending: Internal Medicine

## 2023-09-08 DIAGNOSIS — M25562 Pain in left knee: Secondary | ICD-10-CM | POA: Diagnosis present

## 2023-09-08 DIAGNOSIS — R2689 Other abnormalities of gait and mobility: Secondary | ICD-10-CM | POA: Insufficient documentation

## 2023-09-08 DIAGNOSIS — M25561 Pain in right knee: Secondary | ICD-10-CM | POA: Insufficient documentation

## 2023-09-08 DIAGNOSIS — M6281 Muscle weakness (generalized): Secondary | ICD-10-CM | POA: Diagnosis present

## 2023-09-08 DIAGNOSIS — G8929 Other chronic pain: Secondary | ICD-10-CM | POA: Diagnosis present

## 2023-09-08 NOTE — Therapy (Signed)
 OUTPATIENT PHYSICAL THERAPY KNEE TREATMENT  Patient Name: Gurneet Matarese MRN: [id number] DOB:[birth year], 63 y.o., female Today's Date: 09/08/2023  END OF SESSION:  PT End of Session - 09/08/23 1514     Visit Number 3    Number of Visits 17    Date for PT Re-Evaluation 09/29/23    Authorization Type [employer]    Progress Note Due on Visit 10    PT Start Time 1515    PT Stop Time 1555    PT Time Calculation (min) 40 min    Activity Tolerance Patient tolerated treatment well    Behavior During Therapy WFL for tasks assessed/performed          Past Medical History:  Diagnosis Date   Diabetes mellitus without complication (HCC)    Endometrial adenocarcinoma (HCC)    GERD (gastroesophageal reflux disease)    Hematochezia    Hyperlipidemia    Hypertension    IDA (iron  deficiency anemia)    Inguinal lymphadenopathy 07/13/2015   Irritable bowel syndrome    Obesity    Osteoarthritis    Sleep apnea    Past Surgical History:  Procedure Laterality Date   ABDOMINAL HYSTERECTOMY  feb 2013   CARPAL TUNNEL RELEASE Left 03/20/2021   Procedure: CARPAL TUNNEL RELEASE ENDOSCOPIC;  Surgeon: Edie Norleen PARAS, MD;  Location: [facility] ORS;  Service: Orthopedics;  Laterality: Left;   COLONOSCOPY WITH PROPOFOL  N/A 08/02/2015   Procedure: COLONOSCOPY WITH PROPOFOL ;  Surgeon: Lamar ONEIDA Holmes, MD;  Location: [facility] ENDOSCOPY;  Service: Endoscopy;  Laterality: N/A;   ESOPHAGOGASTRODUODENOSCOPY (EGD) WITH PROPOFOL  N/A 08/02/2015   Procedure: ESOPHAGOGASTRODUODENOSCOPY (EGD) WITH PROPOFOL ;  Surgeon: Lamar ONEIDA Holmes, MD;  Location: [facility] ENDOSCOPY;  Service: Endoscopy;  Laterality: N/A;   Patient Active Problem List   Diagnosis Date Noted   Hepatic steatosis 05/21/2019   Other proteinuria 01/25/2019   Vitamin D deficiency 04/13/2018   Gastroesophageal reflux disease with esophagitis 01/22/2018   Irritable bowel syndrome with diarrhea 04/03/2016   Lymphedema 01/17/2016   Venous (peripheral)  insufficiency 01/17/2016   Pain in limb 01/17/2016   Iron  deficiency anemia due to chronic blood loss 01/09/2016   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   History of endometrial cancer 08/01/2015   Endometrial adenocarcinoma (HCC) 07/13/2015    Class: History of   Inguinal lymphadenopathy 07/13/2015   Long term current use of insulin (HCC) 12/12/2013   Microalbuminuria 12/12/2013   Diabetes (HCC) 07/28/2013   BP (high blood pressure) 07/28/2013   HLD (hyperlipidemia) 07/28/2013   Adiposity 07/28/2013   Obstructive apnea 07/28/2013   Arthritis, degenerative 07/28/2013   Apnea, sleep 07/28/2013   Thyroid nodule 07/28/2013    PCP: Sadie Manna, MD  REFERRING PROVIDER: Sadie Manna, MD  REFERRING DIAG:  M17.11 (ICD-10-CM) - Unilateral primary osteoarthritis, right knee  M25.562 (ICD-10-CM) - Pain in left knee  R26.2 (ICD-10-CM) - Ambulatory dysfunction  R29.898 (ICD-10-CM) - Bilateral leg weakness  Z91.81 (ICD-10-CM) - At risk for fall due to comorbid condition   RATIONALE FOR EVALUATION AND TREATMENT: Rehabilitation  THERAPY DIAG: Muscle weakness (generalized)  Chronic pain of left knee  Chronic pain of right knee  Other abnormalities of gait and mobility  ONSET DATE: Chronic Knee Pain  FOLLOW-UP APPT SCHEDULED WITH REFERRING PROVIDER: Didn't Address    SUBJECTIVE:  SUBJECTIVE STATEMENT:  Pt is a pleasant 63 y/o F presenting to OPPT w/ chronic R knee pain.  PERTINENT HISTORY:   Pt is a 78 y/p F with chronic R knee pain. Pt reports that she hasn't had a fall.  Pt well known to clinic and has had prior bouts of PT for same condition with known R knee OA. She reports intermittent bouts of LE weakness in RLE/LLE depending on how long she stands. Pt reports 6-7/10 NPS in the medial  aspect of the R knee at rest, and 9/10 NPS with activity. Pt's aggravating factors include prolonged walking, standing, sitting for a long time, step and curb navigation. Relieving factors ice modalities, some gentle ROM exercises. Pt reports intermittent N/T down the RLE. She reports independence with walking and use of [employer] but requires assistance from aid for basic ADLs.      PAIN:   Pain Intensity: Present: 7/10, Best: 7/10, Worst: 9/10 Pain location:  R/L Knee  Pain quality: constant, sharp, and aching  Radiating pain: Yes  Swelling: Yes  Popping, catching, locking: Yes  Numbness/Tingling: Yes Focal weakness or buckling: Yes 24-hour pain behavior: Activity Dependent  History of prior back, hip, or knee injury, pain, surgery, or therapy: Yes Imaging: No  Prior level of function: Independent with household mobility with device, Needs assistance with ADLs, and Needs assistance with homemaking  Red flags: Negative for personal history of cancer, chills/fever, night sweats, nausea, vomiting, unexplained weight gain/loss, unrelenting pain  PRECAUTIONS: Fall  WEIGHT BEARING RESTRICTIONS: No  FALLS: Has patient fallen in last 6 months? No  Living Environment Lives with: lives with their family and lives in an assisted living facility Lives in: House/apartment  Hobbies: Hanging out with sister, reading, TV, walking  Patient Goals:     OBJECTIVE:   Patient Surveys  LEFS: (See below at goals)  Cognition Patient is oriented to person, place, and time.  Recent memory is intact.  Remote memory is intact.  Attention span and concentration are intact.  Expressive speech is intact.  Patient's fund of knowledge is within normal limits for educational level.    Gross Musculoskeletal Assessment Tremor: None Bulk: Normal Tone: Normal Edema: Significant swelling in bilateral lower leg, medial/lateral joint line of the knee  GAIT: Distance walked: 80m Assistive device  utilized: Quad cane large base Level of assistance: Modified independence Comments: Decreased velocity, decrease step length bilaterally, circumduction in RLE, decreased knee flexion bilaterally   Posture: Seated: Rounded Shoulders, Forward posture, increased kyphosis, LE positioned in hip abduction and external rotation  AROM AROM (Normal range in degrees) AROM  Hip Right Left  Flexion (125)    Extension (15)    Abduction (40)    Adduction     Internal Rotation (45)    External Rotation (45)        Knee    Flexion (135) 75 83  Extension (0) 0 0      Ankle    Dorsiflexion (20)    Plantarflexion (50)    Inversion (35)    Eversion (15    (* = pain; Blank rows = not tested)  LE MMT: MMT (out of 5) Right Left  Hip flexion 3+ 3+  Hip extension    Hip abduction    Hip adduction    Hip internal rotation 3+ 3+  Hip external rotation 3+ 3+  Knee flexion 4 4  Knee extension 4 4  Ankle dorsiflexion 5 5  Ankle plantarflexion 5 5  Ankle  inversion    Ankle eversion    (* = pain; Blank rows = not tested)  Sensation Grossly intact to light touch bilateral LEs as determined by testing dermatomes L2-S2. Proprioception, and hot/cold testing deferred on this date.  Reflexes Deferred  Muscle Length Hamstring: Deferred Quadriceps: Deferred  Palpation Location LEFT  RIGHT           Quadriceps 1 1  Medial Hamstrings 1 1  Lateral Hamstrings    Lateral Hamstring tendon 1 1  Medial Hamstring tendon 1 1  Quadriceps tendon    Patella    Patellar Tendon 1 1  Tibial Tuberosity    Medial joint line 2 2  Lateral joint line 2 2  MCL 1 1  LCL 1 1  Adductor Tubercle    Pes Anserine tendon    Infrapatellar fat pad    Fibular head    Popliteal fossa    (Blank rows = not tested) Graded on 0-4 scale (0 = no pain, 1 = pain, 2 = pain with wincing/grimacing/flinching, 3 = pain with withdrawal, 4 = unwilling to allow palpation), (Blank rows = not tested)  Passive Accessory  Motion Deferred   SPECIAL TESTS  MCL: Valgus Stress (30 degrees flexion): R: Positive for concordant pain L: Negative  LCL: Varus Stress (30 degrees flexion): R: Positive for concordant pain L: Negavtive  Functional Tests:   5XSTS: 12.67s  :  Stairs: Level of Assistance: Complete Independence, [employer] Stair Negotiation Technique: Step to Pattern with Bilateral Rails Number of Stairs: 4   TODAY'S TREATMENT: DATE: 09/08/2023:  Subjective: Patient reports 8/10 NPS in the bilateral knees. Patient reports that she performs HEP with sister and is able to perform recumbent bike for 15 min. Arrived with family. No further questions or concerns at start of session.   Therapeutic Exercise:   NuStep L5-1 x 5 min x UE/LE x SPM > 80  (Seat 8) for LE warm up, endurance and strength; PT manually adjusted resistance throughout bout.    Hip Matrix Cable Machine   Hip Flexion     R/L: 3 x 10 25#   OMEGA Cable Machine    1 x 10 20#    1 x 10 15#   1 x 10 10#   Hamstring Curl    L: 1 x 10, Blue TB, 1 x 5 Blue TB (muscle spasm)     Seated Hamstring Stretch    30s/bout x 5 in order to improve ROM and tissue extensibility   Bilateral Heel Raise with BUE support 2 x 15 x 1s hold   Therapeutic Activity    Forward Step Up/Down at 6 Step (BUE Support)    2 x 20 steps  Sled Push   2 x 25' 70# Sled  2 x 25' 70# with PT on Rolling stool (PT 190#)    PATIENT EDUCATION:  Education details: HEP, POC, Prognosis Person educated: Patient Education method: Explanation, Demonstration, and Handouts Education comprehension: verbalized understanding and returned demonstration   HOME EXERCISE PROGRAM:   Access Code: [email or web address] URL: [email or web address] Date: 08/04/2023 Prepared by: Lonni Pall  Exercises - Supine Heel Slide with Strap  - 1 x daily - 7 x weekly - 3 sets - 10 reps - Seated Long Arc Quad  - 1 x daily - 7 x weekly - 3 sets - 12-15 reps - Seated  Heel Raise  - 1 x daily - 7 x weekly - 3 sets - 12-15 reps - Seated Hamstring Stretch  -  1 x daily - 7 x weekly - 3 sets - 10-15s hold    ASSESSMENT:  CLINICAL IMPRESSION: Continued PT POC focused on management of bilateral knee pain. Good participation throughout entire session; no muscle spasms from the R hamstring limiting today's session. PT focused on increasing hip flexor group and functional strength. Good demo of increased functional strength as patient able to sled push against PT without exacerbation of knee pain. Pt required multiple seated rest breaks due to LE fatigue. Patient's knee pain still limiting her from walking long distances and recreational activities. Based on today's performance, pt will continue to benefit from skilled PT in order to facilitate return to PLOF and improve QoL.   OBJECTIVE IMPAIRMENTS: Abnormal gait, decreased activity tolerance, decreased balance, decreased endurance, decreased mobility, difficulty walking, decreased ROM, decreased strength, increased edema, increased muscle spasms, impaired flexibility, and pain.   ACTIVITY LIMITATIONS: carrying, lifting, bending, standing, squatting, sleeping, stairs, transfers, bed mobility, bathing, toileting, dressing, and locomotion level  PARTICIPATION LIMITATIONS: cleaning, driving, shopping, community activity, and yard work  PERSONAL FACTORS: Age, Behavior pattern, Past/current experiences, Time since onset of injury/illness/exacerbation, and 3+ comorbidities: HTN, DM, Venous insufficiency are also affecting patient's functional outcome.   REHAB POTENTIAL: Fair Multiple chronic co morbidities   CLINICAL DECISION MAKING: Evolving/moderate complexity  EVALUATION COMPLEXITY: Moderate   GOALS: Goals reviewed with patient? Yes  SHORT TERM GOALS: Target date: 10/06/2023  Pt will be independent with HEP to improve strength and decrease knee pain to improve pain-free function at home and work. Baseline: Initial  Provided Goal status: INITIAL   LONG TERM GOALS: Target date: 11/03/2023  Pt will increase by at least 0.13 m/s in order to demonstrate clinically significant improvement in community ambulation.   Baseline:  Goal status: INITIAL  2.  Pt will decrease worst knee pain by at least 3 points on the NPRS in order to demonstrate clinically significant reduction in knee pain. Baseline: 9/10 NPS Goal status: INITIAL  3.  Pt will decrease LEFS score by at least 9 points in order demonstrate clinically significant reduction in knee pain/disability.       Baseline: 30 / 80 = 37.5 % Goal status: INITIAL  4.  Pt will increase strength of bilateral Hip Flexion, IR/ER, knee ext/flex by at least 1/2 MMT grade in order to demonstrate improvement in strength and function  Baseline: 08/04/2023: R/L   Hip flexion 3+ 3+  Hip extension    Hip abduction    Hip adduction    Hip internal rotation 3+ 3+  Hip external rotation 3+ 3+  Knee flexion 4 4  Knee extension 4 4   Goal status: INITIAL   PLAN: PT FREQUENCY: 1-2x/week  PT DURATION: 8 weeks  PLANNED INTERVENTIONS: Therapeutic exercises, Therapeutic activity, Neuromuscular re-education, Balance training, Gait training, Patient/Family education, Self Care, Joint mobilization, Joint manipulation, Vestibular training, Canalith repositioning, Orthotic/Fit training, DME instructions, Dry Needling, Electrical stimulation, Spinal manipulation, Spinal mobilization, Cryotherapy, Moist heat, Taping, Traction, Manual therapy, and Re-evaluation.  PLAN FOR NEXT SESSION: Review HEP; Progress functional strength; Progress RLE/LLE ROM    Lonni Pall PT, DPT Physical Therapist- [facility]  [facility]  09/08/2023, 3:17 PM

## 2023-09-14 ENCOUNTER — Ambulatory Visit

## 2023-09-16 ENCOUNTER — Ambulatory Visit

## 2023-09-21 ENCOUNTER — Ambulatory Visit

## 2023-09-23 ENCOUNTER — Ambulatory Visit

## 2023-09-28 ENCOUNTER — Ambulatory Visit

## 2023-09-30 ENCOUNTER — Ambulatory Visit

## 2023-10-08 ENCOUNTER — Telehealth: Payer: Self-pay | Admitting: *Deleted

## 2023-10-08 NOTE — Telephone Encounter (Signed)
 Sending message to the schedulers so that she can get a new appointment

## 2023-10-09 ENCOUNTER — Inpatient Hospital Stay: Payer: 59

## 2023-10-09 ENCOUNTER — Inpatient Hospital Stay: Payer: 59 | Admitting: Nurse Practitioner

## 2023-10-19 ENCOUNTER — Inpatient Hospital Stay

## 2023-10-19 ENCOUNTER — Inpatient Hospital Stay: Admitting: Nurse Practitioner

## 2023-10-22 ENCOUNTER — Inpatient Hospital Stay (HOSPITAL_BASED_OUTPATIENT_CLINIC_OR_DEPARTMENT_OTHER): Admitting: Nurse Practitioner

## 2023-10-22 ENCOUNTER — Inpatient Hospital Stay

## 2023-10-22 ENCOUNTER — Encounter: Payer: Self-pay | Admitting: Nurse Practitioner

## 2023-10-22 ENCOUNTER — Inpatient Hospital Stay: Attending: Nurse Practitioner

## 2023-10-22 VITALS — BP 153/85 | HR 85 | Temp 98.9°F | Resp 16 | Wt 261.0 lb

## 2023-10-22 DIAGNOSIS — Z7982 Long term (current) use of aspirin: Secondary | ICD-10-CM | POA: Diagnosis not present

## 2023-10-22 DIAGNOSIS — Z90722 Acquired absence of ovaries, bilateral: Secondary | ICD-10-CM | POA: Diagnosis not present

## 2023-10-22 DIAGNOSIS — N840 Polyp of corpus uteri: Secondary | ICD-10-CM | POA: Insufficient documentation

## 2023-10-22 DIAGNOSIS — D500 Iron deficiency anemia secondary to blood loss (chronic): Secondary | ICD-10-CM

## 2023-10-22 DIAGNOSIS — Z882 Allergy status to sulfonamides status: Secondary | ICD-10-CM | POA: Insufficient documentation

## 2023-10-22 DIAGNOSIS — M2550 Pain in unspecified joint: Secondary | ICD-10-CM | POA: Insufficient documentation

## 2023-10-22 DIAGNOSIS — Z79899 Other long term (current) drug therapy: Secondary | ICD-10-CM | POA: Diagnosis not present

## 2023-10-22 DIAGNOSIS — Z91041 Radiographic dye allergy status: Secondary | ICD-10-CM | POA: Diagnosis not present

## 2023-10-22 DIAGNOSIS — Z833 Family history of diabetes mellitus: Secondary | ICD-10-CM | POA: Insufficient documentation

## 2023-10-22 DIAGNOSIS — D509 Iron deficiency anemia, unspecified: Secondary | ICD-10-CM | POA: Diagnosis not present

## 2023-10-22 DIAGNOSIS — Z90710 Acquired absence of both cervix and uterus: Secondary | ICD-10-CM | POA: Diagnosis not present

## 2023-10-22 DIAGNOSIS — C541 Malignant neoplasm of endometrium: Secondary | ICD-10-CM | POA: Diagnosis present

## 2023-10-22 DIAGNOSIS — K648 Other hemorrhoids: Secondary | ICD-10-CM | POA: Diagnosis not present

## 2023-10-22 DIAGNOSIS — E785 Hyperlipidemia, unspecified: Secondary | ICD-10-CM | POA: Diagnosis not present

## 2023-10-22 DIAGNOSIS — D75839 Thrombocytosis, unspecified: Secondary | ICD-10-CM | POA: Diagnosis not present

## 2023-10-22 LAB — CBC WITH DIFFERENTIAL (CANCER CENTER ONLY)
Abs Immature Granulocytes: 0.05 K/uL (ref 0.00–0.07)
Basophils Absolute: 0 K/uL (ref 0.0–0.1)
Basophils Relative: 0 %
Eosinophils Absolute: 0.2 K/uL (ref 0.0–0.5)
Eosinophils Relative: 2 %
HCT: 39.6 % (ref 36.0–46.0)
Hemoglobin: 12.3 g/dL (ref 12.0–15.0)
Immature Granulocytes: 1 %
Lymphocytes Relative: 18 %
Lymphs Abs: 1.6 K/uL (ref 0.7–4.0)
MCH: 25.6 pg — ABNORMAL LOW (ref 26.0–34.0)
MCHC: 31.1 g/dL (ref 30.0–36.0)
MCV: 82.5 fL (ref 80.0–100.0)
Monocytes Absolute: 0.5 K/uL (ref 0.1–1.0)
Monocytes Relative: 5 %
Neutro Abs: 6.7 K/uL (ref 1.7–7.7)
Neutrophils Relative %: 74 %
Platelet Count: 390 K/uL (ref 150–400)
RBC: 4.8 MIL/uL (ref 3.87–5.11)
RDW: 14.3 % (ref 11.5–15.5)
WBC Count: 9.1 K/uL (ref 4.0–10.5)
nRBC: 0 % (ref 0.0–0.2)

## 2023-10-22 LAB — IRON AND TIBC
Iron: 62 ug/dL (ref 28–170)
Saturation Ratios: 18 % (ref 10.4–31.8)
TIBC: 349 ug/dL (ref 250–450)
UIBC: 287 ug/dL

## 2023-10-22 LAB — FERRITIN: Ferritin: 65 ng/mL (ref 11–307)

## 2023-10-22 LAB — BASIC METABOLIC PANEL WITH GFR
Anion gap: 9 (ref 5–15)
BUN: 13 mg/dL (ref 8–23)
CO2: 28 mmol/L (ref 22–32)
Calcium: 8.8 mg/dL — ABNORMAL LOW (ref 8.9–10.3)
Chloride: 103 mmol/L (ref 98–111)
Creatinine, Ser: 0.82 mg/dL (ref 0.44–1.00)
GFR, Estimated: >60 mL/min (ref >60)
Glucose, Bld: 141 mg/dL — ABNORMAL HIGH (ref 70–99)
Potassium: 3.7 mmol/L (ref 3.5–5.1)
Sodium: 140 mmol/L (ref 135–145)

## 2023-10-22 NOTE — Progress Notes (Signed)
 [facility] OFFICE PROGRESS NOTE  Patient Care Team: Sadie Manna, MD as PCP - General (Internal Medicine) Rennie Cindy SAUNDERS, MD as Consulting Physician (Oncology)   Cancer Staging  No matching staging information was found for the patient.  Oncology History  Endometrial adenocarcinoma (HCC)  07/13/2015 Initial Diagnosis   Endometrial adenocarcinoma (HCC)    Anemia of iron  deficiency unresponsive to oral iron  therapy - got IV Venofer 1 g in Feb 2015, then on oral Ferrous sulfate 1 tablet daily. (labs on 03/28/13 had showed hemoglobin 10.0, MCV 71.3, WBC 10,800, ANC 7990, ALC 1640, platelet count 564, ferritin 27, serum iron  33, iron  saturation low at 7%, TIBC 452. In June 2014 - hemoglobin 9.8, WBC 10,700, platelets 551, LFT unremarkable except alkaline phos of 120, creatinine 0.6). Stool Hemoccult was negative x2 in November 2014. Colonoscopy July 2013 had shown internal hemorrhoids.  Endometrial Cancer- (Dr. Lovetta)- 04/08/11- in office biopsy of endometrial polyp showed adenocarcinoma, well differentiated. She underwent TAH-BSO with DR. Thurnell Pinal and Dr. Lovetta at [facility], grade 1-2, stage Ia (invasion 4 of 20 mm, no LVSI). No adjuvant treatment. NED since.   INTERVAL HISTORY: Alone. Ambulating with a cane. Morbidly obese.  Christine Lawson 63 y.o. female pleasant patient with above history of iron  deficiency anemia and early stage endometrial cancer in 2013, who returns to clinic for follow up. Denies any neurologic complaints. Denies recent fevers or illnesses. Denies vaginal bleeding. Denies any easy bleeding or bruising. No melena or hematochezia. No pica or restless leg. Reports good appetite and denies weight loss. Denies chest pain. Denies any nausea, vomiting, constipation, or diarrhea. Denies urinary complaints. Patient offers no further specific complaints today.  Review of Systems  Constitutional:  Negative for chills, diaphoresis, fever,  malaise/fatigue and weight loss.  HENT:  Negative for nosebleeds and sore throat.   Eyes:  Negative for double vision.  Respiratory:  Negative for cough, hemoptysis, sputum production and wheezing.   Cardiovascular:  Negative for chest pain, palpitations, orthopnea and leg swelling.  Gastrointestinal:  Negative for abdominal pain, blood in stool, constipation, diarrhea, heartburn, melena, nausea and vomiting.  Genitourinary:  Negative for dysuria, frequency, hematuria and urgency.  Musculoskeletal:  Positive for joint pain. Negative for back pain and falls.  Skin: Negative.  Negative for itching and rash.  Neurological:  Negative for dizziness, tingling, focal weakness, weakness and headaches.  Endo/Heme/Allergies:  Does not bruise/bleed easily.  Psychiatric/Behavioral:  Negative for depression. The patient is not nervous/anxious and does not have insomnia.     PAST MEDICAL HISTORY :  Past Medical History:  Diagnosis Date   Diabetes mellitus without complication (HCC)    Endometrial adenocarcinoma (HCC)    GERD (gastroesophageal reflux disease)    Hematochezia    Hyperlipidemia    Hypertension    IDA (iron  deficiency anemia)    Inguinal lymphadenopathy 07/13/2015   Irritable bowel syndrome    Obesity    Osteoarthritis    Sleep apnea     PAST SURGICAL HISTORY :   Past Surgical History:  Procedure Laterality Date   ABDOMINAL HYSTERECTOMY  feb 2013   CARPAL TUNNEL RELEASE Left 03/20/2021   Procedure: CARPAL TUNNEL RELEASE ENDOSCOPIC;  Surgeon: Edie Norleen PARAS, MD;  Location: [facility] ORS;  Service: Orthopedics;  Laterality: Left;   COLONOSCOPY WITH PROPOFOL  N/A 08/02/2015   Procedure: COLONOSCOPY WITH PROPOFOL ;  Surgeon: Lamar ONEIDA Holmes, MD;  Location: [facility] ENDOSCOPY;  Service: Endoscopy;  Laterality: N/A;   ESOPHAGOGASTRODUODENOSCOPY (EGD) WITH PROPOFOL  N/A 08/02/2015  Procedure: ESOPHAGOGASTRODUODENOSCOPY (EGD) WITH PROPOFOL ;  Surgeon: Lamar ONEIDA Holmes, MD;  Location: [facility] ENDOSCOPY;   Service: Endoscopy;  Laterality: N/A;    FAMILY HISTORY :   Family History  Problem Relation Age of Onset   Diabetes Mother    Diabetes Father    Breast cancer Neg Hx     SOCIAL HISTORY:   Social History   Tobacco Use   Smoking status: Never   Smokeless tobacco: Never  Substance Use Topics   Alcohol use: No   Drug use: No    ALLERGIES:  is allergic to solifenacin, contrast media [iodinated contrast media], dicyclomine, isovue [iopamidol], and sulfa antibiotics.  MEDICATIONS:  Current Outpatient Medications  Medication Sig Dispense Refill   albuterol  (PROVENTIL  HFA;VENTOLIN  HFA) 108 (90 Base) MCG/ACT inhaler Inhale 1 puff into the lungs every 6 (six) hours as needed for wheezing or shortness of breath.     amLODipine (NORVASC) 10 MG tablet Take 10 mg by mouth daily.     ascorbic acid (VITAMIN C) 500 MG tablet Take 500 mg by mouth daily.     aspirin 81 MG EC tablet Take 81 mg by mouth daily. Swallow whole.     budesonide-formoterol (SYMBICORT) 80-4.5 MCG/ACT inhaler Inhale 2 puffs into the lungs 2 (two) times daily as needed (shortness of breath).     cholecalciferol (VITAMIN D) 1000 UNITS tablet Take 1,000 Units by mouth daily.     diclofenac sodium (VOLTAREN) 1 % GEL Apply 1 application topically 2 (two) times daily as needed for pain.     enalapril (VASOTEC) 5 MG tablet Take 5 mg by mouth daily.     Fe Bisgly-Vit C-Vit B12-FA (GENTLE IRON ) 28-60-0.008-0.4 MG CAPS Take 2 tablets by mouth daily. 90 capsule 3   HUMALOG KWIKPEN 100 UNIT/ML KwikPen Inject 26-60 Units into the skin 3 (three) times daily. Inject 26 units into the skin with breakfast and lunch and inject 60 units with supper     insulin glargine, 2 Unit Dial, (TOUJEO MAX SOLOSTAR) 300 UNIT/ML Solostar Pen Inject 120-140 Units into the skin daily.     montelukast (SINGULAIR) 10 MG tablet Take 10 mg by mouth daily.     pantoprazole (PROTONIX) 40 MG tablet Take 40 mg by mouth daily.     Peppermint Oil (IBGARD PO) Take 2  tablets by mouth daily.     rosuvastatin (CRESTOR) 5 MG tablet Take 5 mg by mouth daily.     VIBERZI 75 MG TABS Take 1 tablet by mouth 2 (two) times daily.     vitamin B-12 (CYANOCOBALAMIN) 500 MCG tablet Take 500 mcg by mouth daily.     No current facility-administered medications for this visit.    PHYSICAL EXAMINATION: ECOG PERFORMANCE STATUS: 0 - Asymptomatic There were no vitals taken for this visit. There were no vitals filed for this visit. Physical Exam Vitals reviewed.  Constitutional:      Appearance: She is obese.     Comments: Unaccompanied.   HENT:     Head: Normocephalic and atraumatic.  Eyes:     Pupils: Pupils are equal, round, and reactive to light.  Pulmonary:     Effort: No respiratory distress.  Abdominal:     General: There is no distension.     Palpations: Abdomen is soft.     Tenderness: There is no abdominal tenderness. There is no guarding.  Musculoskeletal:     Comments: Cane for ambulation.   Skin:    General: Skin is warm.  Neurological:  Mental Status: She is alert and oriented to person, place, and time.  Psychiatric:        Mood and Affect: Mood and affect normal.        Behavior: Behavior normal.     LABORATORY DATA:  I have reviewed the data as listed  Lab Results  Component Value Date   WBC 9.8 04/09/2023   NEUTROABS 7.2 04/09/2023   HGB 11.8 (L) 04/09/2023   HCT 37.2 04/09/2023   MCV 81.6 04/09/2023   PLT 384 04/09/2023  Iron /TIBC/Ferritin/ %Sat    Component Value Date/Time   IRON  62 04/09/2023 1328   IRON  88 03/17/2014 1031   TIBC 372 04/09/2023 1328   TIBC 350 03/17/2014 1031   FERRITIN 68 04/09/2023 1328   FERRITIN 176 03/17/2014 1031   IRONPCTSAT 17 04/09/2023 1328   IRONPCTSAT 25 03/17/2014 1031    RADIOGRAPHIC STUDIES: I have personally reviewed the radiological images as listed and agreed with the findings in the report. No results found.   ASSESSMENT & PLAN:   Iron  deficiency anemia due to chronic blood  loss Iron  deficiency anemia-question etiology. NOV 2022. Previous GI upset with ferrous sulfate and I recommended rotating to gentle iron , ferrous biglycinate 28 mg, take 2 tablet dialy. Tolerating well. She has not yet required IV iron . Hmg today is 12.3. Ferritin and iron  studies pending. Hold off on IV Feraheme  today.    # History of endometrial cancer- diagnosed 2013, s/p TAH-BSO w/ staging. No adjuvant therapy. NED since. She continues annual surveillance pelvic exam with Dr. Lovetta. Asymptomatic of recurrent disease today. Continue surveillance with gyn.     # Mild thrombocytosis: likely secondary-  MPN panel- NEG. Today, plt normalized at 390. Monitor.     DISPOSITION: # NO infusion  #  follow-up in 6 moths- labs (cbc, bmp, ferritin, iron  studies), Dr Rennie- la   No problem-specific Assessment & Plan notes found for this encounter.  No orders of the defined types were placed in this encounter.  All questions were answered. The patient knows to call the clinic with any problems, questions or concerns.     Tinnie KANDICE Dawn, NP 10/22/2023

## 2023-10-26 ENCOUNTER — Ambulatory Visit: Attending: Internal Medicine

## 2023-10-26 DIAGNOSIS — M25562 Pain in left knee: Secondary | ICD-10-CM | POA: Diagnosis present

## 2023-10-26 DIAGNOSIS — R2689 Other abnormalities of gait and mobility: Secondary | ICD-10-CM | POA: Diagnosis present

## 2023-10-26 DIAGNOSIS — G8929 Other chronic pain: Secondary | ICD-10-CM | POA: Diagnosis present

## 2023-10-26 DIAGNOSIS — M25561 Pain in right knee: Secondary | ICD-10-CM | POA: Diagnosis present

## 2023-10-26 DIAGNOSIS — M6281 Muscle weakness (generalized): Secondary | ICD-10-CM | POA: Insufficient documentation

## 2023-10-26 NOTE — Therapy (Signed)
 OUTPATIENT PHYSICAL THERAPY KNEE TREATMENT/RE CERTIFICATION  Patient Name: Christine Lawson MRN: [id number] DOB:Jul 02, 1960, 63 y.o., female Today's Date: 10/26/2023  END OF SESSION:  PT End of Session - 10/26/23 1508     Visit Number 4    Number of Visits 17    Date for PT Re-Evaluation 12/21/23    Authorization Type [employer]    Progress Note Due on Visit 10    PT Start Time 1510    PT Stop Time 1550    PT Time Calculation (min) 40 min    Activity Tolerance Patient tolerated treatment well    Behavior During Therapy WFL for tasks assessed/performed           Past Medical History:  Diagnosis Date   Diabetes mellitus without complication (HCC)    Endometrial adenocarcinoma (HCC)    GERD (gastroesophageal reflux disease)    Hematochezia    Hyperlipidemia    Hypertension    IDA (iron  deficiency anemia)    Inguinal lymphadenopathy 07/13/2015   Irritable bowel syndrome    Obesity    Osteoarthritis    Sleep apnea    Past Surgical History:  Procedure Laterality Date   ABDOMINAL HYSTERECTOMY  feb 2013   CARPAL TUNNEL RELEASE Left 03/20/2021   Procedure: CARPAL TUNNEL RELEASE ENDOSCOPIC;  Surgeon: Edie Norleen PARAS, MD;  Location: [facility] ORS;  Service: Orthopedics;  Laterality: Left;   COLONOSCOPY WITH PROPOFOL  N/A 08/02/2015   Procedure: COLONOSCOPY WITH PROPOFOL ;  Surgeon: Lamar ONEIDA Holmes, MD;  Location: [facility] ENDOSCOPY;  Service: Endoscopy;  Laterality: N/A;   ESOPHAGOGASTRODUODENOSCOPY (EGD) WITH PROPOFOL  N/A 08/02/2015   Procedure: ESOPHAGOGASTRODUODENOSCOPY (EGD) WITH PROPOFOL ;  Surgeon: Lamar ONEIDA Holmes, MD;  Location: [facility] ENDOSCOPY;  Service: Endoscopy;  Laterality: N/A;   Patient Active Problem List   Diagnosis Date Noted   Hepatic steatosis 05/21/2019   Other proteinuria 01/25/2019   Vitamin D deficiency 04/13/2018   Gastroesophageal reflux disease with esophagitis 01/22/2018   Irritable bowel syndrome with diarrhea 04/03/2016   Lymphedema 01/17/2016   Venous  (peripheral) insufficiency 01/17/2016   Pain in limb 01/17/2016   Iron  deficiency anemia due to chronic blood loss 01/09/2016   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   History of endometrial cancer 08/01/2015   Endometrial adenocarcinoma (HCC) 07/13/2015    Class: History of   Inguinal lymphadenopathy 07/13/2015   Long term current use of insulin (HCC) 12/12/2013   Microalbuminuria 12/12/2013   Diabetes (HCC) 07/28/2013   BP (high blood pressure) 07/28/2013   HLD (hyperlipidemia) 07/28/2013   Adiposity 07/28/2013   Obstructive apnea 07/28/2013   Arthritis, degenerative 07/28/2013   Apnea, sleep 07/28/2013   Thyroid nodule 07/28/2013    PCP: Christine Manna, MD  REFERRING PROVIDER: Sadie Manna, MD  REFERRING DIAG:  M17.11 (ICD-10-CM) - Unilateral primary osteoarthritis, right knee  M25.562 (ICD-10-CM) - Pain in left knee  R26.2 (ICD-10-CM) - Ambulatory dysfunction  R29.898 (ICD-10-CM) - Bilateral leg weakness  Z91.81 (ICD-10-CM) - At risk for fall due to comorbid condition   RATIONALE FOR EVALUATION AND TREATMENT: Rehabilitation  THERAPY DIAG: Muscle weakness (generalized)  Chronic pain of left knee  Chronic pain of right knee  Other abnormalities of gait and mobility  ONSET DATE: Chronic Knee Pain  FOLLOW-UP APPT SCHEDULED WITH REFERRING PROVIDER: Didn't Address    SUBJECTIVE:  SUBJECTIVE STATEMENT:  Pt is a pleasant 63 y/o F presenting to OPPT w/ chronic R knee pain.  PERTINENT HISTORY:   Pt is a 53 y/p F with chronic R knee pain. Pt reports that she hasn't had a fall.  Pt well known to clinic and has had prior bouts of PT for same condition with known R knee OA. She reports intermittent bouts of LE weakness in RLE/LLE depending on how long she stands. Pt reports 6-7/10 NPS in the  medial aspect of the R knee at rest, and 9/10 NPS with activity. Pt's aggravating factors include prolonged walking, standing, sitting for a long time, step and curb navigation. Relieving factors ice modalities, some gentle ROM exercises. Pt reports intermittent N/T down the RLE. She reports independence with walking and use of [employer] but requires assistance from aid for basic ADLs.      PAIN:   Pain Intensity: Present: 7/10, Best: 7/10, Worst: 9/10 Pain location:  R/L Knee  Pain quality: constant, sharp, and aching  Radiating pain: Yes  Swelling: Yes  Popping, catching, locking: Yes  Numbness/Tingling: Yes Focal weakness or buckling: Yes 24-hour pain behavior: Activity Dependent  History of prior back, hip, or knee injury, pain, surgery, or therapy: Yes Imaging: No  Prior level of function: Independent with household mobility with device, Needs assistance with ADLs, and Needs assistance with homemaking  Red flags: Negative for personal history of cancer, chills/fever, night sweats, nausea, vomiting, unexplained weight gain/loss, unrelenting pain  PRECAUTIONS: Fall  WEIGHT BEARING RESTRICTIONS: No  FALLS: Has patient fallen in last 6 months? No  Living Environment Lives with: lives with their family and lives in an assisted living facility Lives in: House/apartment  Hobbies: Hanging out with sister, reading, TV, walking  Patient Goals:     OBJECTIVE:   Patient Surveys  LEFS: (See below at goals)  Cognition Patient is oriented to person, place, and time.  Recent memory is intact.  Remote memory is intact.  Attention span and concentration are intact.  Expressive speech is intact.  Patient's fund of knowledge is within normal limits for educational level.    Gross Musculoskeletal Assessment Tremor: None Bulk: Normal Tone: Normal Edema: Significant swelling in bilateral lower leg, medial/lateral joint line of the knee  GAIT: Distance walked: 11m Assistive  device utilized: Quad cane large base Level of assistance: Modified independence Comments: Decreased velocity, decrease step length bilaterally, circumduction in RLE, decreased knee flexion bilaterally   Posture: Seated: Rounded Shoulders, Forward posture, increased kyphosis, LE positioned in hip abduction and external rotation  AROM AROM (Normal range in degrees) AROM  Hip Right Left  Flexion (125)    Extension (15)    Abduction (40)    Adduction     Internal Rotation (45)    External Rotation (45)        Knee    Flexion (135) 75 83  Extension (0) 0 0      Ankle    Dorsiflexion (20)    Plantarflexion (50)    Inversion (35)    Eversion (15    (* = pain; Blank rows = not tested)  LE MMT: MMT (out of 5) Right Left  Hip flexion 3+ 3+  Hip extension    Hip abduction    Hip adduction    Hip internal rotation 3+ 3+  Hip external rotation 3+ 3+  Knee flexion 4 4  Knee extension 4 4  Ankle dorsiflexion 5 5  Ankle plantarflexion 5 5  Ankle  inversion    Ankle eversion    (* = pain; Blank rows = not tested)  Sensation Grossly intact to light touch bilateral LEs as determined by testing dermatomes L2-S2. Proprioception, and hot/cold testing deferred on this date.  Reflexes Deferred  Muscle Length Hamstring: Deferred Quadriceps: Deferred  Palpation Location LEFT  RIGHT           Quadriceps 1 1  Medial Hamstrings 1 1  Lateral Hamstrings    Lateral Hamstring tendon 1 1  Medial Hamstring tendon 1 1  Quadriceps tendon    Patella    Patellar Tendon 1 1  Tibial Tuberosity    Medial joint line 2 2  Lateral joint line 2 2  MCL 1 1  LCL 1 1  Adductor Tubercle    Pes Anserine tendon    Infrapatellar fat pad    Fibular head    Popliteal fossa    (Blank rows = not tested) Graded on 0-4 scale (0 = no pain, 1 = pain, 2 = pain with wincing/grimacing/flinching, 3 = pain with withdrawal, 4 = unwilling to allow palpation), (Blank rows = not tested)  Passive Accessory  Motion Deferred   SPECIAL TESTS  MCL: Valgus Stress (30 degrees flexion): R: Positive for concordant pain L: Negative  LCL: Varus Stress (30 degrees flexion): R: Positive for concordant pain L: Negavtive  Functional Tests:   5XSTS: 12.67s  :  Stairs: Level of Assistance: Complete Independence, [employer] Stair Negotiation Technique: Step to Pattern with Bilateral Rails Number of Stairs: 4   TODAY'S TREATMENT: DATE: 10/26/23  Subjective: Patient enthusiastic to return to OPPT. Patient overcoming a COPD exacerbation and has recently has been given a couple inhalers. No further questions or concerns at start of session.   Therapeutic Exercise:   Hip Matrix Cable Machine   Hip Flexion     R/L: 3 x 10 25#   Seated Knee Extension    R/L: 3 x 15 - 7.5# AW   Seated Hamstring Curl   R/L: 2 x 10 - Green TB around ankle     Therapeutic Activity:  NuStep L4-1 x 5 min x UE/LE (Seat 9) for LE endurance and strength; PT manually adjusted per patient tolerance.    TRX Squats  for squatting capacity   3 x 10 (Seated rest break b/t sets)  Forward Step Up/Down for capacity for stairs and walking 2 x 10 - BUE Youth worker Push ti increasing walking capacity   4 x 68m - 70# Sled    PATIENT EDUCATION:  Education details: HEP, Exercise Technique  Person educated: Patient Education method: Explanation, Demonstration, and Handouts Education comprehension: verbalized understanding and returned demonstration   HOME EXERCISE PROGRAM:   Access Code: [email or web address] URL: [email or web address] Date: 08/04/2023 Prepared by: Lonni Romeka Scifres  Exercises - Supine Heel Slide with Strap  - 1 x daily - 7 x weekly - 3 sets - 10 reps - Seated Long Arc Quad  - 1 x daily - 7 x weekly - 3 sets - 12-15 reps - Seated Heel Raise  - 1 x daily - 7 x weekly - 3 sets - 12-15 reps - Seated Hamstring Stretch  - 1 x daily - 7 x weekly - 3 sets - 10-15s hold     ASSESSMENT:  CLINICAL IMPRESSION: Continued PT POC focused on management of bilateral knee pain. Reassessment of goals due to over one mos hiatus from pulmonary sickness ( bronchitis). Patient still presenting with limitations in BLE  ROM, strength, ambulation speed and function per goals (See below). She tolerated remainder of the session focused on increasing knee ROM and strength within tolerance. Pt required multiple seated rest breaks due to pulmonary fatigue. One instance of patient requiring inhaler in order to decrease exacerbation.  Patient's knee pain still limiting her from walking long distances and recreational activities. PT requesting an additional 8 weeks 1x/week of skilled PT interventions in order to facilitate return to PLOF and improve QoL   OBJECTIVE IMPAIRMENTS: Abnormal gait, decreased activity tolerance, decreased balance, decreased endurance, decreased mobility, difficulty walking, decreased ROM, decreased strength, increased edema, increased muscle spasms, impaired flexibility, and pain.   ACTIVITY LIMITATIONS: carrying, lifting, bending, standing, squatting, sleeping, stairs, transfers, bed mobility, bathing, toileting, dressing, and locomotion level  PARTICIPATION LIMITATIONS: cleaning, driving, shopping, community activity, and yard work  PERSONAL FACTORS: Age, Behavior pattern, Past/current experiences, Time since onset of injury/illness/exacerbation, and 3+ comorbidities: HTN, DM, Venous insufficiency are also affecting patient's functional outcome.   REHAB POTENTIAL: Fair Multiple chronic co morbidities   CLINICAL DECISION MAKING: Evolving/moderate complexity  EVALUATION COMPLEXITY: Moderate   GOALS: Goals reviewed with patient? Yes  SHORT TERM GOALS: Target date: 11/23/2023  Pt will be independent with HEP to improve strength and decrease knee pain to improve pain-free function at home and work. Baseline: Initial Provided Goal status: INITIAL   LONG  TERM GOALS: Target date: 12/21/2023  Pt will increase by at least 0.13 m/s in order to demonstrate clinically significant improvement in community ambulation.   Baseline: 10/26/2023: .47 m/s Goal status: INITIAL  2.  Pt will decrease worst knee pain by at least 3 points on the NPRS in order to demonstrate clinically significant reduction in knee pain. Baseline: 9/10 NPS; 10/26/2023: 8/10 NPS  Goal status: INITIAL  3.  Pt will decrease LEFS score by at least 9 points in order demonstrate clinically significant reduction in knee pain/disability.       Baseline: 30 / 80 = 37.5 %; 10/26/2023:  Goal status: INITIAL  4.  Pt will increase strength of bilateral Hip Flexion, IR/ER, knee ext/flex by at least 1/2 MMT grade in order to demonstrate improvement in strength and function  Baseline: 08/04/2023: R/L    R L  R/L 10/26/2023  Hip flexion 3+ 3+ 4-/4-  Hip internal rotation 3+ 3+ 3+/3+  Hip external rotation 3+ 3+ 3+/3+  Knee flexion 4 4 4/4  Knee extension 4 4 4/4   Goal status: INITIAL   PLAN: PT FREQUENCY: 1-2x/week  PT DURATION: 8 weeks  PLANNED INTERVENTIONS: Therapeutic exercises, Therapeutic activity, Neuromuscular re-education, Balance training, Gait training, Patient/Family education, Self Care, Joint mobilization, Joint manipulation, Vestibular training, Canalith repositioning, Orthotic/Fit training, DME instructions, Dry Needling, Electrical stimulation, Spinal manipulation, Spinal mobilization, Cryotherapy, Moist heat, Taping, Traction, Manual therapy, and Re-evaluation.  PLAN FOR NEXT SESSION: Review HEP; Progress functional strength; Progress RLE/LLE ROM    Lonni Pall PT, DPT Physical Therapist- [facility]  10/26/2023, 3:08 PM

## 2023-10-28 ENCOUNTER — Encounter

## 2023-11-03 ENCOUNTER — Other Ambulatory Visit: Payer: Self-pay | Admitting: Internal Medicine

## 2023-11-03 ENCOUNTER — Ambulatory Visit: Attending: Internal Medicine

## 2023-11-03 DIAGNOSIS — R2689 Other abnormalities of gait and mobility: Secondary | ICD-10-CM | POA: Insufficient documentation

## 2023-11-03 DIAGNOSIS — G8929 Other chronic pain: Secondary | ICD-10-CM | POA: Insufficient documentation

## 2023-11-03 DIAGNOSIS — M25562 Pain in left knee: Secondary | ICD-10-CM | POA: Diagnosis present

## 2023-11-03 DIAGNOSIS — M6281 Muscle weakness (generalized): Secondary | ICD-10-CM | POA: Diagnosis present

## 2023-11-03 DIAGNOSIS — Z1231 Encounter for screening mammogram for malignant neoplasm of breast: Secondary | ICD-10-CM

## 2023-11-03 DIAGNOSIS — M25561 Pain in right knee: Secondary | ICD-10-CM | POA: Diagnosis present

## 2023-11-03 NOTE — Therapy (Signed)
 OUTPATIENT PHYSICAL THERAPY KNEE TREATMENT  Patient Name: Christine Lawson MRN: [id number] DOB:12-Mar-1960, 63 y.o., female Today's Date: 11/03/2023  END OF SESSION:  PT End of Session - 11/03/23 1644     Visit Number 5    Number of Visits 17    Date for PT Re-Evaluation 12/21/23    Authorization Type [employer]    Progress Note Due on Visit 10    PT Start Time 1646    PT Stop Time 1730    PT Time Calculation (min) 44 min    Activity Tolerance Patient tolerated treatment well    Behavior During Therapy WFL for tasks assessed/performed           Past Medical History:  Diagnosis Date   Diabetes mellitus without complication (HCC)    Endometrial adenocarcinoma (HCC)    GERD (gastroesophageal reflux disease)    Hematochezia    Hyperlipidemia    Hypertension    IDA (iron  deficiency anemia)    Inguinal lymphadenopathy 07/13/2015   Irritable bowel syndrome    Obesity    Osteoarthritis    Sleep apnea    Past Surgical History:  Procedure Laterality Date   ABDOMINAL HYSTERECTOMY  feb 2013   CARPAL TUNNEL RELEASE Left 03/20/2021   Procedure: CARPAL TUNNEL RELEASE ENDOSCOPIC;  Surgeon: Edie Norleen PARAS, MD;  Location: [facility] ORS;  Service: Orthopedics;  Laterality: Left;   COLONOSCOPY WITH PROPOFOL  N/A 08/02/2015   Procedure: COLONOSCOPY WITH PROPOFOL ;  Surgeon: Lamar ONEIDA Holmes, MD;  Location: [facility] ENDOSCOPY;  Service: Endoscopy;  Laterality: N/A;   ESOPHAGOGASTRODUODENOSCOPY (EGD) WITH PROPOFOL  N/A 08/02/2015   Procedure: ESOPHAGOGASTRODUODENOSCOPY (EGD) WITH PROPOFOL ;  Surgeon: Lamar ONEIDA Holmes, MD;  Location: [facility] ENDOSCOPY;  Service: Endoscopy;  Laterality: N/A;   Patient Active Problem List   Diagnosis Date Noted   Hepatic steatosis 05/21/2019   Other proteinuria 01/25/2019   Vitamin D deficiency 04/13/2018   Gastroesophageal reflux disease with esophagitis 01/22/2018   Irritable bowel syndrome with diarrhea 04/03/2016   Lymphedema 01/17/2016   Venous (peripheral)  insufficiency 01/17/2016   Pain in limb 01/17/2016   Iron  deficiency anemia due to chronic blood loss 01/09/2016   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   History of endometrial cancer 08/01/2015   Endometrial adenocarcinoma (HCC) 07/13/2015    Class: History of   Inguinal lymphadenopathy 07/13/2015   Long term current use of insulin (HCC) 12/12/2013   Microalbuminuria 12/12/2013   Diabetes (HCC) 07/28/2013   BP (high blood pressure) 07/28/2013   HLD (hyperlipidemia) 07/28/2013   Adiposity 07/28/2013   Obstructive apnea 07/28/2013   Arthritis, degenerative 07/28/2013   Apnea, sleep 07/28/2013   Thyroid nodule 07/28/2013    PCP: Sadie Manna, MD  REFERRING PROVIDER: Sadie Manna, MD  REFERRING DIAG:  M17.11 (ICD-10-CM) - Unilateral primary osteoarthritis, right knee  M25.562 (ICD-10-CM) - Pain in left knee  R26.2 (ICD-10-CM) - Ambulatory dysfunction  R29.898 (ICD-10-CM) - Bilateral leg weakness  Z91.81 (ICD-10-CM) - At risk for fall due to comorbid condition   RATIONALE FOR EVALUATION AND TREATMENT: Rehabilitation  THERAPY DIAG: Muscle weakness (generalized)  Chronic pain of left knee  Chronic pain of right knee  Other abnormalities of gait and mobility  ONSET DATE: Chronic Knee Pain  FOLLOW-UP APPT SCHEDULED WITH REFERRING PROVIDER: Didn't Address    SUBJECTIVE:  SUBJECTIVE STATEMENT:  Pt is a pleasant 63 y/o F presenting to OPPT w/ chronic R knee pain.  PERTINENT HISTORY:   Pt is a 52 y/p F with chronic R knee pain. Pt reports that she hasn't had a fall.  Pt well known to clinic and has had prior bouts of PT for same condition with known R knee OA. She reports intermittent bouts of LE weakness in RLE/LLE depending on how long she stands. Pt reports 6-7/10 NPS in the medial  aspect of the R knee at rest, and 9/10 NPS with activity. Pt's aggravating factors include prolonged walking, standing, sitting for a long time, step and curb navigation. Relieving factors ice modalities, some gentle ROM exercises. Pt reports intermittent N/T down the RLE. She reports independence with walking and use of [employer] but requires assistance from aid for basic ADLs.      PAIN:   Pain Intensity: Present: 7/10, Best: 7/10, Worst: 9/10 Pain location:  R/L Knee  Pain quality: constant, sharp, and aching  Radiating pain: Yes  Swelling: Yes  Popping, catching, locking: Yes  Numbness/Tingling: Yes Focal weakness or buckling: Yes 24-hour pain behavior: Activity Dependent  History of prior back, hip, or knee injury, pain, surgery, or therapy: Yes Imaging: No  Prior level of function: Independent with household mobility with device, Needs assistance with ADLs, and Needs assistance with homemaking  Red flags: Negative for personal history of cancer, chills/fever, night sweats, nausea, vomiting, unexplained weight gain/loss, unrelenting pain  PRECAUTIONS: Fall  WEIGHT BEARING RESTRICTIONS: No  FALLS: Has patient fallen in last 6 months? No  Living Environment Lives with: lives with their family and lives in an assisted living facility Lives in: House/apartment  Hobbies: Hanging out with sister, reading, TV, walking  Patient Goals:     OBJECTIVE:   Patient Surveys  LEFS: (See below at goals)  Cognition Patient is oriented to person, place, and time.  Recent memory is intact.  Remote memory is intact.  Attention span and concentration are intact.  Expressive speech is intact.  Patient's fund of knowledge is within normal limits for educational level.    Gross Musculoskeletal Assessment Tremor: None Bulk: Normal Tone: Normal Edema: Significant swelling in bilateral lower leg, medial/lateral joint line of the knee  GAIT: Distance walked: 66m Assistive device  utilized: Quad cane large base Level of assistance: Modified independence Comments: Decreased velocity, decrease step length bilaterally, circumduction in RLE, decreased knee flexion bilaterally   Posture: Seated: Rounded Shoulders, Forward posture, increased kyphosis, LE positioned in hip abduction and external rotation  AROM AROM (Normal range in degrees) AROM  Hip Right Left  Flexion (125)    Extension (15)    Abduction (40)    Adduction     Internal Rotation (45)    External Rotation (45)        Knee    Flexion (135) 75 83  Extension (0) 0 0      Ankle    Dorsiflexion (20)    Plantarflexion (50)    Inversion (35)    Eversion (15    (* = pain; Blank rows = not tested)  LE MMT: MMT (out of 5) Right Left  Hip flexion 3+ 3+  Hip extension    Hip abduction    Hip adduction    Hip internal rotation 3+ 3+  Hip external rotation 3+ 3+  Knee flexion 4 4  Knee extension 4 4  Ankle dorsiflexion 5 5  Ankle plantarflexion 5 5  Ankle  inversion    Ankle eversion    (* = pain; Blank rows = not tested)  Sensation Grossly intact to light touch bilateral LEs as determined by testing dermatomes L2-S2. Proprioception, and hot/cold testing deferred on this date.  Reflexes Deferred  Muscle Length Hamstring: Deferred Quadriceps: Deferred  Palpation Location LEFT  RIGHT           Quadriceps 1 1  Medial Hamstrings 1 1  Lateral Hamstrings    Lateral Hamstring tendon 1 1  Medial Hamstring tendon 1 1  Quadriceps tendon    Patella    Patellar Tendon 1 1  Tibial Tuberosity    Medial joint line 2 2  Lateral joint line 2 2  MCL 1 1  LCL 1 1  Adductor Tubercle    Pes Anserine tendon    Infrapatellar fat pad    Fibular head    Popliteal fossa    (Blank rows = not tested) Graded on 0-4 scale (0 = no pain, 1 = pain, 2 = pain with wincing/grimacing/flinching, 3 = pain with withdrawal, 4 = unwilling to allow palpation), (Blank rows = not tested)  Passive Accessory  Motion Deferred   SPECIAL TESTS  MCL: Valgus Stress (30 degrees flexion): R: Positive for concordant pain L: Negative  LCL: Varus Stress (30 degrees flexion): R: Positive for concordant pain L: Negavtive  Functional Tests:   5XSTS: 12.67s  :  Stairs: Level of Assistance: Complete Independence, [employer] Stair Negotiation Technique: Step to Pattern with Bilateral Rails Number of Stairs: 4   TODAY'S TREATMENT: DATE: 11/03/23  Subjective: Pt reports with knee pain. Currently 7.5/10 NPS.   Therapeutic Exercise:   Hip Matrix Cable Machine   Hip Flexion     R/L: 3 x 10 25#   Seated Knee Extension    R/L: 3 x 15 - 7.5# AW   Seated Hamstring Curl   R/L: 2 x 10 - Green TB around ankle     Therapeutic Activity:  NuStep L4-1 x 5 min x UE/LE (Seat 9) for LE endurance and strength; PT manually adjusted per patient tolerance.    TRX Squats for squatting capacity   3 x 10 (Seated rest break b/t sets)  Forward Step Up/Down for capacity for stairs and walking x 10 - BUE [employer]. Severe pain on RLE with step up. Discontinued   Sled Push to increasing walking capacity   4 x 88m - 70# Sled   Side stepping for hip abduction strength: 2x6, 10'/way. SBA.    5 min not billed for bathroom break during session.   PATIENT EDUCATION:  Education details: HEP, Exercise Technique  Person educated: Patient Education method: Explanation, Demonstration, and Handouts Education comprehension: verbalized understanding and returned demonstration   HOME EXERCISE PROGRAM:   Access Code: [email or web address] URL: [email or web address] Date: 08/04/2023 Prepared by: Lonni Go  Exercises - Supine Heel Slide with Strap  - 1 x daily - 7 x weekly - 3 sets - 10 reps - Seated Long Arc Quad  - 1 x daily - 7 x weekly - 3 sets - 12-15 reps - Seated Heel Raise  - 1 x daily - 7 x weekly - 3 sets - 12-15 reps - Seated Hamstring Stretch  - 1 x daily - 7 x weekly - 3 sets - 10-15s  hold    ASSESSMENT:  CLINICAL IMPRESSION: Continued PT POC focused on management of bilateral knee pain. Pt continues to need regular seated rest breaks and mod VC's for improving CKC  knee flexion with exercise. Pt with good flexion abilities but limits her mobility due to her pain levels especially with CKC WB activities. Pt remains motivated to participate working on RLE strength and mobility with focus on functional tasks to work on ADL completion. PT will benefit from skilled PT interventions in order to facilitate return to PLOF and improve QoL.    OBJECTIVE IMPAIRMENTS: Abnormal gait, decreased activity tolerance, decreased balance, decreased endurance, decreased mobility, difficulty walking, decreased ROM, decreased strength, increased edema, increased muscle spasms, impaired flexibility, and pain.   ACTIVITY LIMITATIONS: carrying, lifting, bending, standing, squatting, sleeping, stairs, transfers, bed mobility, bathing, toileting, dressing, and locomotion level  PARTICIPATION LIMITATIONS: cleaning, driving, shopping, community activity, and yard work  PERSONAL FACTORS: Age, Behavior pattern, Past/current experiences, Time since onset of injury/illness/exacerbation, and 3+ comorbidities: HTN, DM, Venous insufficiency are also affecting patient's functional outcome.   REHAB POTENTIAL: Fair Multiple chronic co morbidities   CLINICAL DECISION MAKING: Evolving/moderate complexity  EVALUATION COMPLEXITY: Moderate   GOALS: Goals reviewed with patient? Yes  SHORT TERM GOALS: Target date: 12/01/2023  Pt will be independent with HEP to improve strength and decrease knee pain to improve pain-free function at home and work. Baseline: Initial Provided Goal status: INITIAL   LONG TERM GOALS: Target date: 12/29/2023  Pt will increase by at least 0.13 m/s in order to demonstrate clinically significant improvement in community ambulation.   Baseline: 10/26/2023: .47 m/s Goal status:  INITIAL  2.  Pt will decrease worst knee pain by at least 3 points on the NPRS in order to demonstrate clinically significant reduction in knee pain. Baseline: 9/10 NPS; 10/26/2023: 8/10 NPS  Goal status: INITIAL  3.  Pt will decrease LEFS score by at least 9 points in order demonstrate clinically significant reduction in knee pain/disability.       Baseline: 30 / 80 = 37.5 %; 10/26/2023:  Goal status: INITIAL  4.  Pt will increase strength of bilateral Hip Flexion, IR/ER, knee ext/flex by at least 1/2 MMT grade in order to demonstrate improvement in strength and function  Baseline: 08/04/2023: R/L    R L  R/L 10/26/2023  Hip flexion 3+ 3+ 4-/4-  Hip internal rotation 3+ 3+ 3+/3+  Hip external rotation 3+ 3+ 3+/3+  Knee flexion 4 4 4/4  Knee extension 4 4 4/4   Goal status: INITIAL   PLAN: PT FREQUENCY: 1-2x/week  PT DURATION: 8 weeks  PLANNED INTERVENTIONS: Therapeutic exercises, Therapeutic activity, Neuromuscular re-education, Balance training, Gait training, Patient/Family education, Self Care, Joint mobilization, Joint manipulation, Vestibular training, Canalith repositioning, Orthotic/Fit training, DME instructions, Dry Needling, Electrical stimulation, Spinal manipulation, Spinal mobilization, Cryotherapy, Moist heat, Taping, Traction, Manual therapy, and Re-evaluation.  PLAN FOR NEXT SESSION: Review HEP; Progress functional strength; Progress RLE/LLE ROM   Dorina HERO. Fairly IV, PT, DPT Physical Therapist- [place]  [facility] 11/03/2023, 5:39 PM

## 2023-11-16 ENCOUNTER — Encounter

## 2023-11-18 NOTE — Therapy (Signed)
 OUTPATIENT PHYSICAL THERAPY KNEE TREATMENT  Patient Name: Christine Lawson MRN: [id number] DOB:13-Jan-1961, 63 y.o., female Today's Date: 11/19/2023  END OF SESSION:  PT End of Session - 11/19/23 1559     Visit Number 6    Number of Visits 17    Date for Recertification  12/21/23    Authorization Type [employer]    Progress Note Due on Visit 10    PT Start Time 1600    PT Stop Time 1640    PT Time Calculation (min) 40 min    Activity Tolerance Patient tolerated treatment well    Behavior During Therapy [facility] for tasks assessed/performed            Past Medical History:  Diagnosis Date   Diabetes mellitus without complication (HCC)    Endometrial adenocarcinoma (HCC)    GERD (gastroesophageal reflux disease)    Hematochezia    Hyperlipidemia    Hypertension    IDA (iron  deficiency anemia)    Inguinal lymphadenopathy 07/13/2015   Irritable bowel syndrome    Obesity    Osteoarthritis    Sleep apnea    Past Surgical History:  Procedure Laterality Date   ABDOMINAL HYSTERECTOMY  feb 2013   CARPAL TUNNEL RELEASE Left 03/20/2021   Procedure: CARPAL TUNNEL RELEASE ENDOSCOPIC;  Surgeon: Edie Norleen PARAS, MD;  Location: [facility] ORS;  Service: Orthopedics;  Laterality: Left;   COLONOSCOPY WITH PROPOFOL  N/A 08/02/2015   Procedure: COLONOSCOPY WITH PROPOFOL ;  Surgeon: Lamar ONEIDA Holmes, MD;  Location: [facility] ENDOSCOPY;  Service: Endoscopy;  Laterality: N/A;   ESOPHAGOGASTRODUODENOSCOPY (EGD) WITH PROPOFOL  N/A 08/02/2015   Procedure: ESOPHAGOGASTRODUODENOSCOPY (EGD) WITH PROPOFOL ;  Surgeon: Lamar ONEIDA Holmes, MD;  Location: [facility] ENDOSCOPY;  Service: Endoscopy;  Laterality: N/A;   Patient Active Problem List   Diagnosis Date Noted   Hepatic steatosis 05/21/2019   Other proteinuria 01/25/2019   Vitamin D deficiency 04/13/2018   Gastroesophageal reflux disease with esophagitis 01/22/2018   Irritable bowel syndrome with diarrhea 04/03/2016   Lymphedema 01/17/2016   Venous (peripheral)  insufficiency 01/17/2016   Pain in limb 01/17/2016   Iron  deficiency anemia due to chronic blood loss 01/09/2016   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   History of endometrial cancer 08/01/2015   Endometrial adenocarcinoma (HCC) 07/13/2015    Class: History of   Inguinal lymphadenopathy 07/13/2015   Long term current use of insulin (HCC) 12/12/2013   Microalbuminuria 12/12/2013   Diabetes (HCC) 07/28/2013   BP (high blood pressure) 07/28/2013   HLD (hyperlipidemia) 07/28/2013   Adiposity 07/28/2013   Obstructive apnea 07/28/2013   Arthritis, degenerative 07/28/2013   Apnea, sleep 07/28/2013   Thyroid nodule 07/28/2013    PCP: Sadie Manna, MD  REFERRING PROVIDER: Sadie Manna, MD  REFERRING DIAG:  M17.11 (ICD-10-CM) - Unilateral primary osteoarthritis, right knee  M25.562 (ICD-10-CM) - Pain in left knee  R26.2 (ICD-10-CM) - Ambulatory dysfunction  R29.898 (ICD-10-CM) - Bilateral leg weakness  Z91.81 (ICD-10-CM) - At risk for fall due to comorbid condition   RATIONALE FOR EVALUATION AND TREATMENT: Rehabilitation  THERAPY DIAG: Muscle weakness (generalized)  Chronic pain of left knee  Chronic pain of right knee  Other abnormalities of gait and mobility  ONSET DATE: Chronic Knee Pain  FOLLOW-UP APPT SCHEDULED WITH REFERRING PROVIDER: Didn't Address    SUBJECTIVE:  SUBJECTIVE STATEMENT:  Pt is a pleasant 63 y/o F presenting to OPPT w/ chronic R knee pain.  PERTINENT HISTORY:   Pt is a 63 y/p F with chronic R knee pain. Pt reports that she hasn't had a fall.  Pt well known to clinic and has had prior bouts of PT for same condition with known R knee OA. She reports intermittent bouts of LE weakness in RLE/LLE depending on how long she stands. Pt reports 6-7/10 NPS in the medial  aspect of the R knee at rest, and 9/10 NPS with activity. Pt's aggravating factors include prolonged walking, standing, sitting for a long time, step and curb navigation. Relieving factors ice modalities, some gentle ROM exercises. Pt reports intermittent N/T down the RLE. She reports independence with walking and use of [employer] but requires assistance from aid for basic ADLs.      PAIN:   Pain Intensity: Present: 7/10, Best: 7/10, Worst: 9/10 Pain location:  R/L Knee  Pain quality: constant, sharp, and aching  Radiating pain: Yes  Swelling: Yes  Popping, catching, locking: Yes  Numbness/Tingling: Yes Focal weakness or buckling: Yes 24-hour pain behavior: Activity Dependent  History of prior back, hip, or knee injury, pain, surgery, or therapy: Yes Imaging: No  Prior level of function: Independent with household mobility with device, Needs assistance with ADLs, and Needs assistance with homemaking  Red flags: Negative for personal history of cancer, chills/fever, night sweats, nausea, vomiting, unexplained weight gain/loss, unrelenting pain  PRECAUTIONS: Fall  WEIGHT BEARING RESTRICTIONS: No  FALLS: Has patient fallen in last 6 months? No  Living Environment Lives with: lives with their family and lives in an assisted living facility Lives in: House/apartment  Hobbies: Hanging out with sister, reading, TV, walking  Patient Goals:     OBJECTIVE:   Patient Surveys  LEFS: (See below at goals)  Cognition Patient is oriented to person, place, and time.  Recent memory is intact.  Remote memory is intact.  Attention span and concentration are intact.  Expressive speech is intact.  Patient's fund of knowledge is within normal limits for educational level.    Gross Musculoskeletal Assessment Tremor: None Bulk: Normal Tone: Normal Edema: Significant swelling in bilateral lower leg, medial/lateral joint line of the knee  GAIT: Distance walked: 49m Assistive device  utilized: Quad cane large base Level of assistance: Modified independence Comments: Decreased velocity, decrease step length bilaterally, circumduction in RLE, decreased knee flexion bilaterally   Posture: Seated: Rounded Shoulders, Forward posture, increased kyphosis, LE positioned in hip abduction and external rotation  AROM AROM (Normal range in degrees) AROM  Hip Right Left  Flexion (125)    Extension (15)    Abduction (40)    Adduction     Internal Rotation (45)    External Rotation (45)        Knee    Flexion (135) 75 83  Extension (0) 0 0      Ankle    Dorsiflexion (20)    Plantarflexion (50)    Inversion (35)    Eversion (15    (* = pain; Blank rows = not tested)  LE MMT: MMT (out of 5) Right Left  Hip flexion 3+ 3+  Hip extension    Hip abduction    Hip adduction    Hip internal rotation 3+ 3+  Hip external rotation 3+ 3+  Knee flexion 4 4  Knee extension 4 4  Ankle dorsiflexion 5 5  Ankle plantarflexion 5 5  Ankle  inversion    Ankle eversion    (* = pain; Blank rows = not tested)  Sensation Grossly intact to light touch bilateral LEs as determined by testing dermatomes L2-S2. Proprioception, and hot/cold testing deferred on this date.  Reflexes Deferred  Muscle Length Hamstring: Deferred Quadriceps: Deferred  Palpation Location LEFT  RIGHT           Quadriceps 1 1  Medial Hamstrings 1 1  Lateral Hamstrings    Lateral Hamstring tendon 1 1  Medial Hamstring tendon 1 1  Quadriceps tendon    Patella    Patellar Tendon 1 1  Tibial Tuberosity    Medial joint line 2 2  Lateral joint line 2 2  MCL 1 1  LCL 1 1  Adductor Tubercle    Pes Anserine tendon    Infrapatellar fat pad    Fibular head    Popliteal fossa    (Blank rows = not tested) Graded on 0-4 scale (0 = no pain, 1 = pain, 2 = pain with wincing/grimacing/flinching, 3 = pain with withdrawal, 4 = unwilling to allow palpation), (Blank rows = not tested)  Passive Accessory  Motion Deferred   SPECIAL TESTS  MCL: Valgus Stress (30 degrees flexion): R: Positive for concordant pain L: Negative  LCL: Varus Stress (30 degrees flexion): R: Positive for concordant pain L: Negavtive  Functional Tests:   5XSTS: 12.67s  :  Stairs: Level of Assistance: Complete Independence, [employer] Stair Negotiation Technique: Step to Pattern with Bilateral Rails Number of Stairs: 4   TODAY'S TREATMENT: DATE: 11/19/23   Subjective: Patient reports 7/10 knee pain.   Therapeutic Exercise:   Hip Matrix Cable Machine   Hip Flexion     R/L: 3 x 10 25#   Seated Knee Extension    R/L: 3 x 15 - 7.5# AW   Seated Hamstring Curl   R/L: 2 x 10 - Green TB around ankle     Therapeutic Activity:  NuStep L4-1 x 5 min x UE/LE (Seat 9) for LE endurance and strength; PT manually adjusted per patient tolerance.    TRX Squats for squatting capacity   3 x 10 (Seated rest break b/t sets)  Side stepping for hip abduction strength: 2x6, 10'/way. SBA.   Fwd step down 6 step x 8 leading with each LE    PATIENT EDUCATION:  Education details: HEP, Exercise Technique  Person educated: Patient Education method: Explanation, Demonstration, and Handouts Education comprehension: verbalized understanding and returned demonstration   HOME EXERCISE PROGRAM:   Access Code: [email or web address] URL: [email or web address] Date: 08/04/2023 Prepared by: Lonni Go  Exercises - Supine Heel Slide with Strap  - 1 x daily - 7 x weekly - 3 sets - 10 reps - Seated Long Arc Quad  - 1 x daily - 7 x weekly - 3 sets - 12-15 reps - Seated Heel Raise  - 1 x daily - 7 x weekly - 3 sets - 12-15 reps - Seated Hamstring Stretch  - 1 x daily - 7 x weekly - 3 sets - 10-15s hold    ASSESSMENT:  CLINICAL IMPRESSION:   Continued PT POC focused on management of bilateral knee pain. Session focused on BLE strengthening. Continues to require frequent seated rest breaks due to fatigue.  Complaining of knee pain with forward step downs. PT will benefit from skilled PT interventions in order to facilitate return to PLOF and improve QoL.    OBJECTIVE IMPAIRMENTS: Abnormal gait, decreased activity tolerance, decreased balance, decreased  endurance, decreased mobility, difficulty walking, decreased ROM, decreased strength, increased edema, increased muscle spasms, impaired flexibility, and pain.   ACTIVITY LIMITATIONS: carrying, lifting, bending, standing, squatting, sleeping, stairs, transfers, bed mobility, bathing, toileting, dressing, and locomotion level  PARTICIPATION LIMITATIONS: cleaning, driving, shopping, community activity, and yard work  PERSONAL FACTORS: Age, Behavior pattern, Past/current experiences, Time since onset of injury/illness/exacerbation, and 3+ comorbidities: HTN, DM, Venous insufficiency are also affecting patient's functional outcome.   REHAB POTENTIAL: Fair Multiple chronic co morbidities   CLINICAL DECISION MAKING: Evolving/moderate complexity  EVALUATION COMPLEXITY: Moderate   GOALS: Goals reviewed with patient? Yes  SHORT TERM GOALS: Target date: 12/17/2023  Pt will be independent with HEP to improve strength and decrease knee pain to improve pain-free function at home and work. Baseline: Initial Provided Goal status: INITIAL   LONG TERM GOALS: Target date: 01/14/2024  Pt will increase by at least 0.13 m/s in order to demonstrate clinically significant improvement in community ambulation.   Baseline: 10/26/2023: .47 m/s Goal status: INITIAL  2.  Pt will decrease worst knee pain by at least 3 points on the NPRS in order to demonstrate clinically significant reduction in knee pain. Baseline: 9/10 NPS; 10/26/2023: 8/10 NPS  Goal status: INITIAL  3.  Pt will decrease LEFS score by at least 9 points in order demonstrate clinically significant reduction in knee pain/disability.       Baseline: 30 / 80 = 37.5 %; 10/26/2023:  Goal  status: INITIAL  4.  Pt will increase strength of bilateral Hip Flexion, IR/ER, knee ext/flex by at least 1/2 MMT grade in order to demonstrate improvement in strength and function  Baseline: 08/04/2023: R/L    R L  R/L 10/26/2023  Hip flexion 3+ 3+ 4-/4-  Hip internal rotation 3+ 3+ 3+/3+  Hip external rotation 3+ 3+ 3+/3+  Knee flexion 4 4 4/4  Knee extension 4 4 4/4   Goal status: INITIAL   PLAN: PT FREQUENCY: 1-2x/week  PT DURATION: 8 weeks  PLANNED INTERVENTIONS: Therapeutic exercises, Therapeutic activity, Neuromuscular re-education, Balance training, Gait training, Patient/Family education, Self Care, Joint mobilization, Joint manipulation, Vestibular training, Canalith repositioning, Orthotic/Fit training, DME instructions, Dry Needling, Electrical stimulation, Spinal manipulation, Spinal mobilization, Cryotherapy, Moist heat, Taping, Traction, Manual therapy, and Re-evaluation.  PLAN FOR NEXT SESSION: Review HEP; Progress functional strength; Progress RLE/LLE ROM   Maryanne Finder, PT, DPT Physical Therapist - [facility]  [facility] 11/19/2023, 3:59 PM

## 2023-11-19 ENCOUNTER — Ambulatory Visit

## 2023-11-19 DIAGNOSIS — M6281 Muscle weakness (generalized): Secondary | ICD-10-CM | POA: Diagnosis not present

## 2023-11-19 DIAGNOSIS — R2689 Other abnormalities of gait and mobility: Secondary | ICD-10-CM

## 2023-11-19 DIAGNOSIS — G8929 Other chronic pain: Secondary | ICD-10-CM

## 2023-11-25 ENCOUNTER — Encounter

## 2023-11-26 ENCOUNTER — Encounter

## 2023-12-07 NOTE — Therapy (Incomplete)
 OUTPATIENT PHYSICAL THERAPY KNEE TREATMENT  Patient Name: Christine Lawson MRN: [id number] DOB:27-Oct-1960, 63 y.o., female Today's Date: 12/07/2023  END OF SESSION:      Past Medical History:  Diagnosis Date   Diabetes mellitus without complication (HCC)    Endometrial adenocarcinoma (HCC)    GERD (gastroesophageal reflux disease)    Hematochezia    Hyperlipidemia    Hypertension    IDA (iron  deficiency anemia)    Inguinal lymphadenopathy 07/13/2015   Irritable bowel syndrome    Obesity    Osteoarthritis    Sleep apnea    Past Surgical History:  Procedure Laterality Date   ABDOMINAL HYSTERECTOMY  feb 2013   CARPAL TUNNEL RELEASE Left 03/20/2021   Procedure: CARPAL TUNNEL RELEASE ENDOSCOPIC;  Surgeon: Edie Norleen PARAS, MD;  Location: [facility] ORS;  Service: Orthopedics;  Laterality: Left;   COLONOSCOPY WITH PROPOFOL  N/A 08/02/2015   Procedure: COLONOSCOPY WITH PROPOFOL ;  Surgeon: Lamar ONEIDA Holmes, MD;  Location: [facility] ENDOSCOPY;  Service: Endoscopy;  Laterality: N/A;   ESOPHAGOGASTRODUODENOSCOPY (EGD) WITH PROPOFOL  N/A 08/02/2015   Procedure: ESOPHAGOGASTRODUODENOSCOPY (EGD) WITH PROPOFOL ;  Surgeon: Lamar ONEIDA Holmes, MD;  Location: [facility] ENDOSCOPY;  Service: Endoscopy;  Laterality: N/A;   Patient Active Problem List   Diagnosis Date Noted   Hepatic steatosis 05/21/2019   Other proteinuria 01/25/2019   Vitamin D deficiency 04/13/2018   Gastroesophageal reflux disease with esophagitis 01/22/2018   Irritable bowel syndrome with diarrhea 04/03/2016   Lymphedema 01/17/2016   Venous (peripheral) insufficiency 01/17/2016   Pain in limb 01/17/2016   Iron  deficiency anemia due to chronic blood loss 01/09/2016   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   History of endometrial cancer 08/01/2015   Endometrial adenocarcinoma (HCC) 07/13/2015    Class: History of   Inguinal lymphadenopathy 07/13/2015   Long term current use of insulin (HCC) 12/12/2013   Microalbuminuria 12/12/2013   Diabetes  (HCC) 07/28/2013   BP (high blood pressure) 07/28/2013   HLD (hyperlipidemia) 07/28/2013   Adiposity 07/28/2013   Obstructive apnea 07/28/2013   Arthritis, degenerative 07/28/2013   Apnea, sleep 07/28/2013   Thyroid nodule 07/28/2013    PCP: Sadie Manna, MD  REFERRING PROVIDER: Sadie Manna, MD  REFERRING DIAG:  M17.11 (ICD-10-CM) - Unilateral primary osteoarthritis, right knee  M25.562 (ICD-10-CM) - Pain in left knee  R26.2 (ICD-10-CM) - Ambulatory dysfunction  R29.898 (ICD-10-CM) - Bilateral leg weakness  Z91.81 (ICD-10-CM) - At risk for fall due to comorbid condition   RATIONALE FOR EVALUATION AND TREATMENT: Rehabilitation  THERAPY DIAG: Muscle weakness (generalized)  Chronic pain of left knee  Chronic pain of right knee  Other abnormalities of gait and mobility  ONSET DATE: Chronic Knee Pain  FOLLOW-UP APPT SCHEDULED WITH REFERRING PROVIDER: Didn't Address    SUBJECTIVE:  SUBJECTIVE STATEMENT:  Pt is a pleasant 63 y/o F presenting to OPPT w/ chronic R knee pain.  PERTINENT HISTORY:   Pt is a 39 y/p F with chronic R knee pain. Pt reports that she hasn't had a fall.  Pt well known to clinic and has had prior bouts of PT for same condition with known R knee OA. She reports intermittent bouts of LE weakness in RLE/LLE depending on how long she stands. Pt reports 6-7/10 NPS in the medial aspect of the R knee at rest, and 9/10 NPS with activity. Pt's aggravating factors include prolonged walking, standing, sitting for a long time, step and curb navigation. Relieving factors ice modalities, some gentle ROM exercises. Pt reports intermittent N/T down the RLE. She reports independence with walking and use of [employer] but requires assistance from aid for basic ADLs.      PAIN:   Pain  Intensity: Present: 7/10, Best: 7/10, Worst: 9/10 Pain location:  R/L Knee  Pain quality: constant, sharp, and aching  Radiating pain: Yes  Swelling: Yes  Popping, catching, locking: Yes  Numbness/Tingling: Yes Focal weakness or buckling: Yes 24-hour pain behavior: Activity Dependent  History of prior back, hip, or knee injury, pain, surgery, or therapy: Yes Imaging: No  Prior level of function: Independent with household mobility with device, Needs assistance with ADLs, and Needs assistance with homemaking  Red flags: Negative for personal history of cancer, chills/fever, night sweats, nausea, vomiting, unexplained weight gain/loss, unrelenting pain  PRECAUTIONS: Fall  WEIGHT BEARING RESTRICTIONS: No  FALLS: Has patient fallen in last 6 months? No  Living Environment Lives with: lives with their family and lives in an assisted living facility Lives in: House/apartment  Hobbies: Hanging out with sister, reading, TV, walking  Patient Goals:     OBJECTIVE:   Patient Surveys  LEFS: (See below at goals)  Cognition Patient is oriented to person, place, and time.  Recent memory is intact.  Remote memory is intact.  Attention span and concentration are intact.  Expressive speech is intact.  Patient's fund of knowledge is within normal limits for educational level.    Gross Musculoskeletal Assessment Tremor: None Bulk: Normal Tone: Normal Edema: Significant swelling in bilateral lower leg, medial/lateral joint line of the knee  GAIT: Distance walked: 18m Assistive device utilized: Quad cane large base Level of assistance: Modified independence Comments: Decreased velocity, decrease step length bilaterally, circumduction in RLE, decreased knee flexion bilaterally   Posture: Seated: Rounded Shoulders, Forward posture, increased kyphosis, LE positioned in hip abduction and external rotation  AROM AROM (Normal range in degrees) AROM  Hip Right Left  Flexion (125)     Extension (15)    Abduction (40)    Adduction     Internal Rotation (45)    External Rotation (45)        Knee    Flexion (135) 75 83  Extension (0) 0 0      Ankle    Dorsiflexion (20)    Plantarflexion (50)    Inversion (35)    Eversion (15    (* = pain; Blank rows = not tested)  LE MMT: MMT (out of 5) Right Left  Hip flexion 3+ 3+  Hip extension    Hip abduction    Hip adduction    Hip internal rotation 3+ 3+  Hip external rotation 3+ 3+  Knee flexion 4 4  Knee extension 4 4  Ankle dorsiflexion 5 5  Ankle plantarflexion 5 5  Ankle  inversion    Ankle eversion    (* = pain; Blank rows = not tested)  Sensation Grossly intact to light touch bilateral LEs as determined by testing dermatomes L2-S2. Proprioception, and hot/cold testing deferred on this date.  Reflexes Deferred  Muscle Length Hamstring: Deferred Quadriceps: Deferred  Palpation Location LEFT  RIGHT           Quadriceps 1 1  Medial Hamstrings 1 1  Lateral Hamstrings    Lateral Hamstring tendon 1 1  Medial Hamstring tendon 1 1  Quadriceps tendon    Patella    Patellar Tendon 1 1  Tibial Tuberosity    Medial joint line 2 2  Lateral joint line 2 2  MCL 1 1  LCL 1 1  Adductor Tubercle    Pes Anserine tendon    Infrapatellar fat pad    Fibular head    Popliteal fossa    (Blank rows = not tested) Graded on 0-4 scale (0 = no pain, 1 = pain, 2 = pain with wincing/grimacing/flinching, 3 = pain with withdrawal, 4 = unwilling to allow palpation), (Blank rows = not tested)  Passive Accessory Motion Deferred   SPECIAL TESTS  MCL: Valgus Stress (30 degrees flexion): R: Positive for concordant pain L: Negative  LCL: Varus Stress (30 degrees flexion): R: Positive for concordant pain L: Negavtive  Functional Tests:   5XSTS: 12.67s  :  Stairs: Level of Assistance: Complete Independence, [employer] Stair Negotiation Technique: Step to Pattern with Bilateral Rails Number of Stairs:  4   TODAY'S TREATMENT: DATE: 12/07/23  ***  Subjective: Patient reports 7/10 knee pain.   Therapeutic Exercise:   Hip Matrix Cable Machine   Hip Flexion     R/L: 3 x 10 25#   Seated Knee Extension    R/L: 3 x 15 - 7.5# AW   Seated Hamstring Curl   R/L: 2 x 10 - Green TB around ankle     Therapeutic Activity:  NuStep L4-1 x 5 min x UE/LE (Seat 9) for LE endurance and strength; PT manually adjusted per patient tolerance.    TRX Squats for squatting capacity   3 x 10 (Seated rest break b/t sets)  Side stepping for hip abduction strength: 2x6, 10'/way. SBA.   Fwd step down 6 step x 8 leading with each LE    PATIENT EDUCATION:  Education details: HEP, Exercise Technique  Person educated: Patient Education method: Explanation, Demonstration, and Handouts Education comprehension: verbalized understanding and returned demonstration   HOME EXERCISE PROGRAM:   Access Code: [email or web address] URL: [email or web address] Date: 08/04/2023 Prepared by: Lonni Go  Exercises - Supine Heel Slide with Strap  - 1 x daily - 7 x weekly - 3 sets - 10 reps - Seated Long Arc Quad  - 1 x daily - 7 x weekly - 3 sets - 12-15 reps - Seated Heel Raise  - 1 x daily - 7 x weekly - 3 sets - 12-15 reps - Seated Hamstring Stretch  - 1 x daily - 7 x weekly - 3 sets - 10-15s hold    ASSESSMENT:  CLINICAL IMPRESSION:  ***  Continued PT POC focused on management of bilateral knee pain. Session focused on BLE strengthening. Continues to require frequent seated rest breaks due to fatigue. Complaining of knee pain with forward step downs. PT will benefit from skilled PT interventions in order to facilitate return to PLOF and improve QoL.    OBJECTIVE IMPAIRMENTS: Abnormal gait, decreased activity tolerance, decreased balance,  decreased endurance, decreased mobility, difficulty walking, decreased ROM, decreased strength, increased edema, increased muscle spasms, impaired flexibility, and  pain.   ACTIVITY LIMITATIONS: carrying, lifting, bending, standing, squatting, sleeping, stairs, transfers, bed mobility, bathing, toileting, dressing, and locomotion level  PARTICIPATION LIMITATIONS: cleaning, driving, shopping, community activity, and yard work  PERSONAL FACTORS: Age, Behavior pattern, Past/current experiences, Time since onset of injury/illness/exacerbation, and 3+ comorbidities: HTN, DM, Venous insufficiency are also affecting patient's functional outcome.   REHAB POTENTIAL: Fair Multiple chronic co morbidities   CLINICAL DECISION MAKING: Evolving/moderate complexity  EVALUATION COMPLEXITY: Moderate   GOALS: Goals reviewed with patient? Yes  SHORT TERM GOALS: Target date: 01/04/2024  Pt will be independent with HEP to improve strength and decrease knee pain to improve pain-free function at home and work. Baseline: Initial Provided Goal status: INITIAL   LONG TERM GOALS: Target date: 02/01/2024  Pt will increase by at least 0.13 m/s in order to demonstrate clinically significant improvement in community ambulation.   Baseline: 10/26/2023: .47 m/s Goal status: INITIAL  2.  Pt will decrease worst knee pain by at least 3 points on the NPRS in order to demonstrate clinically significant reduction in knee pain. Baseline: 9/10 NPS; 10/26/2023: 8/10 NPS  Goal status: INITIAL  3.  Pt will decrease LEFS score by at least 9 points in order demonstrate clinically significant reduction in knee pain/disability.       Baseline: 30 / 80 = 37.5 %; 10/26/2023:  Goal status: INITIAL  4.  Pt will increase strength of bilateral Hip Flexion, IR/ER, knee ext/flex by at least 1/2 MMT grade in order to demonstrate improvement in strength and function  Baseline: 08/04/2023: R/L    R L  R/L 10/26/2023  Hip flexion 3+ 3+ 4-/4-  Hip internal rotation 3+ 3+ 3+/3+  Hip external rotation 3+ 3+ 3+/3+  Knee flexion 4 4 4/4  Knee extension 4 4 4/4   Goal status:  INITIAL   PLAN: PT FREQUENCY: 1-2x/week  PT DURATION: 8 weeks  PLANNED INTERVENTIONS: Therapeutic exercises, Therapeutic activity, Neuromuscular re-education, Balance training, Gait training, Patient/Family education, Self Care, Joint mobilization, Joint manipulation, Vestibular training, Canalith repositioning, Orthotic/Fit training, DME instructions, Dry Needling, Electrical stimulation, Spinal manipulation, Spinal mobilization, Cryotherapy, Moist heat, Taping, Traction, Manual therapy, and Re-evaluation.  PLAN FOR NEXT SESSION: Review HEP; Progress functional strength; Progress RLE/LLE ROM   Maryanne Finder, PT, DPT Physical Therapist - [place]  [facility] 12/07/2023, 9:34 AM

## 2023-12-08 ENCOUNTER — Ambulatory Visit

## 2023-12-08 DIAGNOSIS — M6281 Muscle weakness (generalized): Secondary | ICD-10-CM

## 2023-12-08 DIAGNOSIS — G8929 Other chronic pain: Secondary | ICD-10-CM

## 2023-12-08 DIAGNOSIS — R2689 Other abnormalities of gait and mobility: Secondary | ICD-10-CM

## 2023-12-13 ENCOUNTER — Emergency Department

## 2023-12-13 ENCOUNTER — Other Ambulatory Visit: Payer: Self-pay

## 2023-12-13 ENCOUNTER — Emergency Department
Admission: EM | Admit: 2023-12-13 | Discharge: 2023-12-13 | Disposition: A | Discharge status: Home / Self Care | Attending: Emergency Medicine | Admitting: Emergency Medicine

## 2023-12-13 DIAGNOSIS — J45909 Unspecified asthma, uncomplicated: Secondary | ICD-10-CM | POA: Insufficient documentation

## 2023-12-13 DIAGNOSIS — R0789 Other chest pain: Secondary | ICD-10-CM | POA: Diagnosis present

## 2023-12-13 DIAGNOSIS — E119 Type 2 diabetes mellitus without complications: Secondary | ICD-10-CM | POA: Diagnosis not present

## 2023-12-13 DIAGNOSIS — D72829 Elevated white blood cell count, unspecified: Secondary | ICD-10-CM | POA: Insufficient documentation

## 2023-12-13 DIAGNOSIS — R079 Chest pain, unspecified: Secondary | ICD-10-CM

## 2023-12-13 LAB — TROPONIN I (HIGH SENSITIVITY)
Troponin I (High Sensitivity): 8 ng/L (ref <18)
Troponin I (High Sensitivity): 9 ng/L (ref <18)

## 2023-12-13 LAB — CBC
HCT: 41.8 % (ref 36.0–46.0)
Hemoglobin: 12.8 g/dL (ref 12.0–15.0)
MCH: 25.3 pg — ABNORMAL LOW (ref 26.0–34.0)
MCHC: 30.6 g/dL (ref 30.0–36.0)
MCV: 82.6 fL (ref 80.0–100.0)
Platelets: 488 K/uL — ABNORMAL HIGH (ref 150–400)
RBC: 5.06 MIL/uL (ref 3.87–5.11)
RDW: 13.9 % (ref 11.5–15.5)
WBC: 12.6 K/uL — ABNORMAL HIGH (ref 4.0–10.5)
nRBC: 0 % (ref 0.0–0.2)

## 2023-12-13 LAB — COMPREHENSIVE METABOLIC PANEL WITH GFR
ALT: 24 U/L (ref 0–44)
AST: 29 U/L (ref 15–41)
Albumin: 3.5 g/dL (ref 3.5–5.0)
Alkaline Phosphatase: 109 U/L (ref 38–126)
Anion gap: 11 (ref 5–15)
BUN: 8 mg/dL (ref 8–23)
CO2: 28 mmol/L (ref 22–32)
Calcium: 8.9 mg/dL (ref 8.9–10.3)
Chloride: 101 mmol/L (ref 98–111)
Creatinine, Ser: 0.85 mg/dL (ref 0.44–1.00)
GFR, Estimated: >60 mL/min (ref >60)
Glucose, Bld: 114 mg/dL — ABNORMAL HIGH (ref 70–99)
Potassium: 3.9 mmol/L (ref 3.5–5.1)
Sodium: 140 mmol/L (ref 135–145)
Total Bilirubin: 0.7 mg/dL (ref 0.0–1.2)
Total Protein: 8.1 g/dL (ref 6.5–8.1)

## 2023-12-13 LAB — LIPASE, BLOOD: Lipase: 26 U/L (ref 11–51)

## 2023-12-13 MED ORDER — IPRATROPIUM-ALBUTEROL 0.5-2.5 (3) MG/3ML IN SOLN
3.0000 mL | Freq: Once | RESPIRATORY_TRACT | Status: AC
  Administered 2023-12-13: 3 mL via RESPIRATORY_TRACT
  Filled 2023-12-13: qty 3

## 2023-12-13 MED ORDER — FAMOTIDINE 20 MG PO TABS: 20.0000 mg | ORAL_TABLET | Freq: Two times a day (BID) | ORAL | 0 refills | Status: AC

## 2023-12-13 MED ORDER — ASPIRIN 325 MG PO TABS
325.0000 mg | ORAL_TABLET | Freq: Once | ORAL | Status: AC
  Administered 2023-12-13: 325 mg via ORAL
  Filled 2023-12-13: qty 1

## 2023-12-13 NOTE — ED Triage Notes (Signed)
 Pt to ED via POV from Home for CP that started yesterday. Pt stating pain in mid chest, does endorse SOB and some weakness. Pt rates pain 9/10. Pt denies any heart hx. Pt stating felt some discomfort yesterday morning, but got worse later that afternoon. Pt denies pain radiating anywhere else.

## 2023-12-13 NOTE — ED Provider Notes (Signed)
 Procedures     ----------------------------------------- 5:28 PM on 12/13/2023 -----------------------------------------  Patient continues to feel well.  Serial troponins are normal.  Cardiology referral ordered.  Recommend trial of Pepcid in the meantime.  Stable for discharge.    Viviann Pastor, MD 12/13/23 1728

## 2023-12-13 NOTE — ED Provider Notes (Signed)
 [facility] Provider Note    Event Date/Time   First MD Initiated Contact with Patient 12/13/23 1335     (approximate)   History   Chest Pain   HPI  Christine Lawson is a 63 year old female with history of diabetes, hyperlipidemia, GERD presenting to the emergency department for evaluation of chest pain.  Patient reports that last night she had onset of chest pain described as a tightness in the center of her chest, nonradiating.  Not specifically pleuritic or exertional.  Denies history of similar.  Does not think she has had prior cardiac workup.  Does have history of asthma, been using her rescue inhaler which she thinks may be helping.  Physical Exam   Triage Vital Signs: ED Triage Vitals  Encounter Vitals Group     BP 12/13/23 1325 (!) 170/65     Girls Systolic BP Percentile --      Girls Diastolic BP Percentile --      Boys Systolic BP Percentile --      Boys Diastolic BP Percentile --      Pulse Rate 12/13/23 1325 88     Resp 12/13/23 1325 18     Temp 12/13/23 1325 98.5 F (36.9 C)     Temp Source 12/13/23 1325 Oral     SpO2 12/13/23 1325 94 %     Weight 12/13/23 1326 160 lb (72.6 kg)     Height 12/13/23 1326 5' 3 (1.6 m)     Head Circumference --      Peak Flow --      Pain Score 12/13/23 1326 9     Pain Loc --      Pain Education --      Exclude from Growth Chart --     Most recent vital signs: Vitals:   12/13/23 1325 12/13/23 1400  BP: (!) 170/65 (!) 158/57  Pulse: 88 98  Resp: 18 (!) 22  Temp: 98.5 F (36.9 C)   SpO2: 94% 100%     General: Awake, interactive  CV:  Good peripheral perfusion, regular rate Resp:  Unlabored respirations, lungs mildly diminished without frank wheezing Abd:  Nondistended, soft, nontender Neuro:  Symmetric facial movement, fluid speech   ED Results / Procedures / Treatments   Labs (all labs ordered are listed, but only abnormal results are displayed) Labs Reviewed  CBC - Abnormal;  Notable for the following components:      Result Value   WBC 12.6 (*)    MCH 25.3 (*)    Platelets 488 (*)    All other components within normal limits  COMPREHENSIVE METABOLIC PANEL WITH GFR - Abnormal; Notable for the following components:   Glucose, Bld 114 (*)    All other components within normal limits  LIPASE, BLOOD  TROPONIN I (HIGH SENSITIVITY)  TROPONIN I (HIGH SENSITIVITY)     EKG EKG independently reviewed and interpreted by myself demonstrates:  EKG demonstrates sinus rhythm at a rate of 92, PR 152, QRS 72, QTc 432, no acute ST changes, PVC noted, no STEMI  RADIOLOGY Imaging independently reviewed and interpreted by myself demonstrates:    Formal Radiology Read:  DG Chest 2 View Result Date: 12/13/2023 CLINICAL DATA:  Chest pain and shortness of breath. EXAM: CHEST - 2 VIEW COMPARISON:  06/03/2013. FINDINGS: The heart is enlarged and the mediastinal contour is within normal limits. No consolidation, effusion, or pneumothorax is seen. Degenerative changes are present in the thoracic spine. No acute osseous abnormality. IMPRESSION:  No active cardiopulmonary disease. Electronically Signed   By: Leita Birmingham M.D.   On: 12/13/2023 14:44    PROCEDURES:  Critical Care performed: No  Procedures   MEDICATIONS ORDERED IN ED: Medications  aspirin tablet 325 mg (325 mg Oral Given 12/13/23 1452)  ipratropium-albuterol  (DUONEB) 0.5-2.5 (3) MG/3ML nebulizer solution 3 mL (3 mLs Nebulization Given 12/13/23 1452)     IMPRESSION / MDM / ASSESSMENT AND PLAN / ED COURSE  I reviewed the triage vital signs and the nursing notes.  Differential diagnosis includes, but is not limited to, ACS, pneumonia, pneumothorax, GERD, pancreatitis, asthma flare  Patient's presentation is most consistent with acute presentation with potential threat to life or bodily function.  63 year old female presenting to the emergency department for evaluation of chest pain.  But otherwise stable  vitals on presentation.  Blood pressure improved without intervention.  Labs with mild leukocytosis with WC of 12.6, normal hemoglobin.  CMP without significant derangement.  Negative initial troponin.  Normal lipase.  CXR without focal consolidation.  Signed out to oncoming physician at 1500 pending second troponin and disposition.  If patient symptoms are improved and repeat troponin is negative, suspect she may be stable for discharge with outpatient cardiology follow-up.     FINAL CLINICAL IMPRESSION(S) / ED DIAGNOSES   Final diagnoses:  Nonspecific chest pain     Rx / DC Orders   ED Discharge Orders     None        Note:  This document was prepared using Dragon voice recognition software and may include unintentional dictation errors.   Levander Slate, MD 12/13/23 [phone]

## 2023-12-17 ENCOUNTER — Encounter

## 2023-12-22 NOTE — Progress Notes (Signed)
 Chronic Care Management Plan of Care  PCP: Sadie Tamra Cal, MD   Summary/Recommendations:  Assessment: Diabetes Mellitus is under poor control, >/= 8.0 .   Plan: Continue current medications   CGM sensor replaced in office today and samples given to patient. Counseled to always place sensors on back of the upper arm and remove and replace sensors that are painful or bleeding after insertion.   Christine Lawson is a 63 y.o. female who presents for Chronic Care Management for Diabetes and Hypertension per referral from [facility], Tamra Cal, MD. This comprehensive care plan has been tailored for this patient.   Christine Lawson has verbally consented to participate in Chronic Care Management Services (CCM). Patient is aware of the cost sharing cost. Patient is aware that only one practitioner can furnish and be paid for CCM services during a calendar month. Patient is aware that he/she/they has the right to stop CCM services at any time (effective at the end of the calendar month.)   Subjective   Patient Concerns: Sensor issues   Current diet: Patient reports varied diet, but does endorse concentrated carbohydrates (apple, banana, cookies). Trying to limit fried foods   Current exercise: Some walking    Diabetes Mellitus Type II  Unstable  Current meds: Toujeo 140-160 units daily  Humalog 26 / 24 / 60 units  If sugar is under 100 before the meal, take 1/2 the dose (30 if supper, 12 if lunch, 13 if breakfast). If her sugar is 100-150 before the meal, take about 3/4 of the dose (45 if supper, 18 if lunch, 19 if breakfast) If her sugar is over 150 before the meal, take the whole dose (60 if supper, 24 if lunch, 26 if breakfast). If she is over 250 before any meal, she should add 4 to the meal dose. Meds Tried/Failed: Metformin, Ozempic (GI)   Patient currently uses CGM - Dexcom  blood glucose monitoring. Reports recent issue with sensor that was very painful +  bruising when inserted.     Any hypoglycemic episodes: Yes - treats with glucose tablets or ginger ale   Hypertension   Unstable Current meds: Amlodipine 10 mg daily  Enalapril 5 mg daily  Meds Tried/Failed:   BP Readings from Last 3 Encounters:  12/22/23 126/72  11/26/23 118/70  10/15/23 134/67    Denies: chest pain on exertion, chest pain at rest, palpitations, edema, and syncope   Avoiding excessive salt intake: Yes   Hyperlipidemia   Unstable Current meds: Rosuvastatin 5 mg daily  Meds Tried/Failed:   Medication Compliance and Access:  Taking medications as directed:  Yes   Able to afford medications: Yes  Objective   There were no vitals filed for this visit.  There is no height or weight on file to calculate BMI. Home vitals:    There is no height or weight on file to calculate BMI.            Most recent labs: HgbA1c:  Lab Results  Component Value Date   HGBA1C 8.7 (H) 10/08/2023   HGBA1C 9.0 (H) 06/15/2023   HGBA1C 9.7 (H) 04/30/2023     LDL:  Lab Results  Component Value Date   LDLCALC 107 06/15/2023     The 10-year ASCVD risk score (Arnett DK, et al., 2019) is: 17.5%   Values used to calculate the score:     Age: 75 years     Clinically relevant sex: Female     Is Non-Hispanic African American: Yes  Diabetic: Yes     Tobacco smoker: No     Systolic Blood Pressure: 126 mmHg     Is BP treated: Yes     HDL Cholesterol: 47 mg/dL     Total Cholesterol: 206 mg/dL   Assessment and Care Plan   Diabetes Mellitus Assessment: Diabetes Mellitus is under poor control, >/= 8.0 . Blood sugars are at goal 39% of the time, high 32% of the time, and very high 28% of the time.  Complications: Microalbuminuria  and neuropathy Plan: Continue current medications   CGM sensor replaced in office today and samples given to patient. Counseled to always place sensors on back of the upper arm and remove and replace sensors that are painful or bleeding  after insertion.   Hypertension Assessment: Blood pressure is under good control today, >/= 140/90 Plan: Continue current medication(s).  Statin Use  Assessment:    The patient has the following risk factors: Diabetes Mellitus     Last LDL is: not at goal, >/=70    Patient is currently:  Taking a statin.  Plan: Continue current medications    Goals Addressed   None     This interdisciplinary team is responsible for creating and maintaining this shared plan of care.   Patient Care Team: Sadie Tamra Cal, MD as PCP - General (Pulmonary Disease) [place] - GENERIC as PCP - [facility] Dingeldein, Elspeth LABOR, MD as Physician (Ophthalmology) Sandria Selma Maduro, CPP as Pharmacist (Internal Medicine)  Follow-up   Follow up: 2 weeks  Future Appointments     Date/Time Provider Department Center Visit Type   12/22/2023 3:00 PM Sandria Selma Maduro, CPP [facility] C CHRONIC CARE MANAGEMENT   01/06/2024 2:00 PM Sandria Selma Maduro, CPP [facility] C CHRONIC CARE MANAGEMENT   01/07/2024 2:30 PM Callwood, Cara Endow, MD [facility] C NEW PATIENT   02/04/2024 2:45 PM Sadie Tamra Cal, MD [facility] MARYL BROCKS [facility] OFFICE VISIT   02/09/2024 3:30 PM Samuella Twyla Rocks, PA [facility] C FOLLOW UP   02/11/2024 2:15 PM Cherilyn Debby Quivers, MD [facility] C RETURN VISIT   10/04/2024 2:45 PM Theotis Lavelle BRAVO, MD [facility] C RETURN VISIT      The care plan was provided for the patient either in written format or through MyChart.  I spent 15 minutes with patient and/or family on this visit today.   Attestation Statement:   I personally performed the service, incident to.  (I2)   ALEXANDRE MADURO SANDRIA, CPP  Provider countersignature:VISHWANATH CAL SADIE, MD

## 2023-12-31 ENCOUNTER — Ambulatory Visit: Attending: Internal Medicine

## 2023-12-31 DIAGNOSIS — R2689 Other abnormalities of gait and mobility: Secondary | ICD-10-CM | POA: Diagnosis present

## 2023-12-31 DIAGNOSIS — M25562 Pain in left knee: Secondary | ICD-10-CM | POA: Insufficient documentation

## 2023-12-31 DIAGNOSIS — G8929 Other chronic pain: Secondary | ICD-10-CM | POA: Diagnosis present

## 2023-12-31 DIAGNOSIS — M6281 Muscle weakness (generalized): Secondary | ICD-10-CM | POA: Diagnosis present

## 2023-12-31 DIAGNOSIS — M25561 Pain in right knee: Secondary | ICD-10-CM | POA: Diagnosis present

## 2023-12-31 NOTE — Therapy (Signed)
 OUTPATIENT PHYSICAL THERAPY KNEE TREATMENT/RECERTIFICATION   Patient Name: Christine Lawson MRN: [id number] DOB:30-Nov-1960, 63 y.o., female Today's Date: 12/31/2023  END OF SESSION:  PT End of Session - 12/31/23 1522     Visit Number 7    Number of Visits 29    Date for Recertification  02/11/24    Authorization Type [employer]    Progress Note Due on Visit 10    PT Start Time 1517    PT Stop Time 1557    PT Time Calculation (min) 40 min    Activity Tolerance Patient tolerated treatment well    Behavior During Therapy [facility] for tasks assessed/performed            Past Medical History:  Diagnosis Date   Diabetes mellitus without complication (HCC)    Endometrial adenocarcinoma (HCC)    GERD (gastroesophageal reflux disease)    Hematochezia    Hyperlipidemia    Hypertension    IDA (iron  deficiency anemia)    Inguinal lymphadenopathy 07/13/2015   Irritable bowel syndrome    Obesity    Osteoarthritis    Sleep apnea    Past Surgical History:  Procedure Laterality Date   ABDOMINAL HYSTERECTOMY  feb 2013   CARPAL TUNNEL RELEASE Left 03/20/2021   Procedure: CARPAL TUNNEL RELEASE ENDOSCOPIC;  Surgeon: Edie Norleen PARAS, MD;  Location: [facility] ORS;  Service: Orthopedics;  Laterality: Left;   COLONOSCOPY WITH PROPOFOL  N/A 08/02/2015   Procedure: COLONOSCOPY WITH PROPOFOL ;  Surgeon: Lamar ONEIDA Holmes, MD;  Location: [facility] ENDOSCOPY;  Service: Endoscopy;  Laterality: N/A;   ESOPHAGOGASTRODUODENOSCOPY (EGD) WITH PROPOFOL  N/A 08/02/2015   Procedure: ESOPHAGOGASTRODUODENOSCOPY (EGD) WITH PROPOFOL ;  Surgeon: Lamar ONEIDA Holmes, MD;  Location: [facility] ENDOSCOPY;  Service: Endoscopy;  Laterality: N/A;   Patient Active Problem List   Diagnosis Date Noted   Hepatic steatosis 05/21/2019   Other proteinuria 01/25/2019   Vitamin D deficiency 04/13/2018   Gastroesophageal reflux disease with esophagitis 01/22/2018   Irritable bowel syndrome with diarrhea 04/03/2016   Lymphedema 01/17/2016   Venous  (peripheral) insufficiency 01/17/2016   Pain in limb 01/17/2016   Iron  deficiency anemia due to chronic blood loss 01/09/2016   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   History of endometrial cancer 08/01/2015   Endometrial adenocarcinoma (HCC) 07/13/2015    Class: History of   Inguinal lymphadenopathy 07/13/2015   Long term current use of insulin (HCC) 12/12/2013   Microalbuminuria 12/12/2013   Diabetes (HCC) 07/28/2013   BP (high blood pressure) 07/28/2013   HLD (hyperlipidemia) 07/28/2013   Adiposity 07/28/2013   Obstructive apnea 07/28/2013   Arthritis, degenerative 07/28/2013   Apnea, sleep 07/28/2013   Thyroid nodule 07/28/2013    PCP: Sadie Manna, MD  REFERRING PROVIDER: Sadie Manna, MD  REFERRING DIAG:  M17.11 (ICD-10-CM) - Unilateral primary osteoarthritis, right knee  M25.562 (ICD-10-CM) - Pain in left knee  R26.2 (ICD-10-CM) - Ambulatory dysfunction  R29.898 (ICD-10-CM) - Bilateral leg weakness  Z91.81 (ICD-10-CM) - At risk for fall due to comorbid condition   RATIONALE FOR EVALUATION AND TREATMENT: Rehabilitation  THERAPY DIAG: Muscle weakness (generalized)  Chronic pain of left knee  Chronic pain of right knee  Other abnormalities of gait and mobility  ONSET DATE: Chronic Knee Pain  FOLLOW-UP APPT SCHEDULED WITH REFERRING PROVIDER: Didn't Address    SUBJECTIVE:  SUBJECTIVE STATEMENT:  Pt is a pleasant 63 y/o F presenting to OPPT w/ chronic R knee pain.  PERTINENT HISTORY:   Pt is a 63 y/p F with chronic R knee pain. Pt reports that she hasn't had a fall.  Pt well known to clinic and has had prior bouts of PT for same condition with known R knee OA. She reports intermittent bouts of LE weakness in RLE/LLE depending on how long she stands. Pt reports 6-7/10 NPS in the  medial aspect of the R knee at rest, and 9/10 NPS with activity. Pt's aggravating factors include prolonged walking, standing, sitting for a long time, step and curb navigation. Relieving factors ice modalities, some gentle ROM exercises. Pt reports intermittent N/T down the RLE. She reports independence with walking and use of [employer] but requires assistance from aid for basic ADLs.      PAIN:   Pain Intensity: Present: 7/10, Best: 7/10, Worst: 9/10 Pain location:  R/L Knee  Pain quality: constant, sharp, and aching  Radiating pain: Yes  Swelling: Yes  Popping, catching, locking: Yes  Numbness/Tingling: Yes Focal weakness or buckling: Yes 24-hour pain behavior: Activity Dependent  History of prior back, hip, or knee injury, pain, surgery, or therapy: Yes Imaging: No  Prior level of function: Independent with household mobility with device, Needs assistance with ADLs, and Needs assistance with homemaking  Red flags: Negative for personal history of cancer, chills/fever, night sweats, nausea, vomiting, unexplained weight gain/loss, unrelenting pain  PRECAUTIONS: Fall  WEIGHT BEARING RESTRICTIONS: No  FALLS: Has patient fallen in last 6 months? No  Living Environment Lives with: lives with their family and lives in an assisted living facility Lives in: House/apartment  Hobbies: Hanging out with sister, reading, TV, walking  Patient Goals:     OBJECTIVE:   Patient Surveys  LEFS: (See below at goals)  Cognition Patient is oriented to person, place, and time.  Recent memory is intact.  Remote memory is intact.  Attention span and concentration are intact.  Expressive speech is intact.  Patient's fund of knowledge is within normal limits for educational level.    Gross Musculoskeletal Assessment Tremor: None Bulk: Normal Tone: Normal Edema: Significant swelling in bilateral lower leg, medial/lateral joint line of the knee  GAIT: Distance walked: 52m Assistive  device utilized: Quad cane large base Level of assistance: Modified independence Comments: Decreased velocity, decrease step length bilaterally, circumduction in RLE, decreased knee flexion bilaterally   Posture: Seated: Rounded Shoulders, Forward posture, increased kyphosis, LE positioned in hip abduction and external rotation  AROM AROM (Normal range in degrees) AROM  Hip Right Left  Flexion (125)    Extension (15)    Abduction (40)    Adduction     Internal Rotation (45)    External Rotation (45)        Knee    Flexion (135) 75 83  Extension (0) 0 0      Ankle    Dorsiflexion (20)    Plantarflexion (50)    Inversion (35)    Eversion (15    (* = pain; Blank rows = not tested)  LE MMT: MMT (out of 5) Right Left  Hip flexion 3+ 3+  Hip extension    Hip abduction    Hip adduction    Hip internal rotation 3+ 3+  Hip external rotation 3+ 3+  Knee flexion 4 4  Knee extension 4 4  Ankle dorsiflexion 5 5  Ankle plantarflexion 5 5  Ankle  inversion    Ankle eversion    (* = pain; Blank rows = not tested)  Sensation Grossly intact to light touch bilateral LEs as determined by testing dermatomes L2-S2. Proprioception, and hot/cold testing deferred on this date.  Reflexes Deferred  Muscle Length Hamstring: Deferred Quadriceps: Deferred  Palpation Location LEFT  RIGHT           Quadriceps 1 1  Medial Hamstrings 1 1  Lateral Hamstrings    Lateral Hamstring tendon 1 1  Medial Hamstring tendon 1 1  Quadriceps tendon    Patella    Patellar Tendon 1 1  Tibial Tuberosity    Medial joint line 2 2  Lateral joint line 2 2  MCL 1 1  LCL 1 1  Adductor Tubercle    Pes Anserine tendon    Infrapatellar fat pad    Fibular head    Popliteal fossa    (Blank rows = not tested) Graded on 0-4 scale (0 = no pain, 1 = pain, 2 = pain with wincing/grimacing/flinching, 3 = pain with withdrawal, 4 = unwilling to allow palpation), (Blank rows = not tested)  Passive Accessory  Motion Deferred   SPECIAL TESTS  MCL: Valgus Stress (30 degrees flexion): R: Positive for concordant pain L: Negative  LCL: Varus Stress (30 degrees flexion): R: Positive for concordant pain L: Negavtive  Functional Tests:   5XSTS: 12.67s  :  Stairs: Level of Assistance: Complete Independence, [employer] Stair Negotiation Technique: Step to Pattern with Bilateral Rails Number of Stairs: 4   TODAY'S TREATMENT: DATE: 12/31/23   Subjective: Patient reports 8/10 pain in both of her knees. Patient states that reason for missing other appointments were because of chest pain. Dr ruled out Heart attack and stroke; patient reports that the pain was from acid reflux. She reports that she will see her heart doctor   Therapeutic Exercise:  Seated Knee Extension    R/L: 3 x 15 - 7.5# AW   Seated Hamstring Curl   R/L: 2 x 10 - Green TB around ankle   Reviewed HEP and and provided updated printout. Encouraged adherence in order to reduce stiffness in R/L knee joint.     Therapeutic Activity:  NuStep L4-1 x 6 min x UE/LE (Seat 9) for LE endurance and strength; PT manually adjusted per patient tolerance.    Step Up and Step Down (6) - BUE Support   3 x 12 Reps (seated rest break)  Sit to Stand with OH press with Med ball (from elevated mat table - 23)  3 x 10 - 3 Kg med ball   PATIENT EDUCATION:  Education details: HEP, Exercise Technique  Person educated: Patient Education method: Explanation, Demonstration, and Handouts Education comprehension: verbalized understanding and returned demonstration   HOME EXERCISE PROGRAM:  Access Code: [email or web address] URL: [email or web address] Date: 12/31/2023 Prepared by: Lonni Pall  Exercises - Supine Heel Slide with Strap  - 1 x daily - 7 x weekly - 3 sets - 10 reps - Seated Long Arc Quad  - 1 x daily - 7 x weekly - 3 sets - 12-15 reps - Seated Heel Raise  - 1 x daily - 7 x weekly - 3 sets - 12-15 reps - Seated  Hamstring Stretch  - 1 x daily - 7 x weekly - 3 sets - 10-15s hold - Sit to Stand with Armchair  - 1 x daily - 7 x weekly - 3 sets - 8-10 reps - Heel Raises with Counter Support  -  1 x daily - 7 x weekly - 3 sets - 8-10 reps  Access Code: [email or web address] URL: [email or web address] Date: 08/04/2023 Prepared by: Lonni Nubia Ziesmer  Exercises - Supine Heel Slide with Strap  - 1 x daily - 7 x weekly - 3 sets - 10 reps - Seated Long Arc Quad  - 1 x daily - 7 x weekly - 3 sets - 12-15 reps - Seated Heel Raise  - 1 x daily - 7 x weekly - 3 sets - 12-15 reps - Seated Hamstring Stretch  - 1 x daily - 7 x weekly - 3 sets - 10-15s hold    ASSESSMENT:  CLINICAL IMPRESSION:   Continued PT POC focused on management of bilateral knee pain. Patient returned to OPPT following a small hiatus due to cardiovascular concerns. PT reassessed patient status since beginning of POC. Patient continues to have significant pain in bilateral ankles wth prolonged walking and dependent positions. LEFS self reported outcome with improved score however 41/80 still indicates moderate to significant disability. Gait speed still remains at a limited community ambulating speed (.41 m/s) and she requires small base quad cane in order to maintain balance. She tolerated remainder of the session with appropriate rest breaks in order to mitigate fatigue. Patient's condition has the potential to improve in response to therapy. Maximum improvement is yet to be obtained. The anticipated improvement is attainable and reasonable in a generally predictable time. PT requesting for 1x/week for an additional 6 weeks in order to improve current QoL and maximize safe discharge.  OBJECTIVE IMPAIRMENTS: Abnormal gait, decreased activity tolerance, decreased balance, decreased endurance, decreased mobility, difficulty walking, decreased ROM, decreased strength, increased edema, increased muscle spasms, impaired flexibility, and pain.   ACTIVITY  LIMITATIONS: carrying, lifting, bending, standing, squatting, sleeping, stairs, transfers, bed mobility, bathing, toileting, dressing, and locomotion level  PARTICIPATION LIMITATIONS: cleaning, driving, shopping, community activity, and yard work  PERSONAL FACTORS: Age, Behavior pattern, Past/current experiences, Time since onset of injury/illness/exacerbation, and 3+ comorbidities: HTN, DM, Venous insufficiency are also affecting patient's functional outcome.   REHAB POTENTIAL: Fair Multiple chronic co morbidities   CLINICAL DECISION MAKING: Evolving/moderate complexity  EVALUATION COMPLEXITY: Moderate   GOALS: Goals reviewed with patient? Yes  SHORT TERM GOALS: Target date:  09/01/2023   Pt will be independent with HEP to improve strength and decrease knee pain to improve pain-free function at home and work. Baseline: 50% adherence  Goal status: PRogressing   LONG TERM GOALS: Target date: 02/11/2024  Pt will increase by at least 0.13 m/s in order to demonstrate clinically significant improvement in community ambulation.   Baseline: 10/26/2023: .47 m/s (Quad Cane); 12/31/2023: .45 m/s  Goal status: Progressing  2.  Pt will decrease worst knee pain by at least 3 points on the NPRS in order to demonstrate clinically significant reduction in knee pain. Baseline: 9/10 NPS; 10/26/2023: 8/10 NPS; 12/31/2023: 8/10 NPS Goal status: Progressing  3.  Pt will increase LEFS score by at least 9 points in order demonstrate clinically significant reduction in knee pain/disability.       Baseline: 30 / 80 = 37.5 %; 10/26/2023: Not documented; 12/31/2023: 41/80 (80/80 = no disability) Goal status: Progressing   4.  Pt will increase strength of bilateral Hip Flexion, IR/ER, knee ext/flex by at least 1/2 MMT grade in order to demonstrate improvement in strength and function  Baseline: 08/04/2023: R/L    R L  R/L 10/26/2023  Hip flexion 3+ 3+ 4-/4-  Hip internal rotation 3+ 3+ 3+/3+  Hip  external rotation 3+ 3+ 3+/3+  Knee flexion 4 4 4/4  Knee extension 4 4 4/4   Goal status: Progressing   PLAN: PT FREQUENCY: 1-2x/week  PT DURATION: 6 weeks  PLANNED INTERVENTIONS: Therapeutic exercises, Therapeutic activity, Neuromuscular re-education, Balance training, Gait training, Patient/Family education, Self Care, Joint mobilization, Joint manipulation, Vestibular training, Canalith repositioning, Orthotic/Fit training, DME instructions, Dry Needling, Electrical stimulation, Spinal manipulation, Spinal mobilization, Cryotherapy, Moist heat, Taping, Traction, Manual therapy, and Re-evaluation.  PLAN FOR NEXT SESSION: Review HEP; Progress functional strength; Progress RLE/LLE ROM   Lonni Pall PT, DPT Physical Therapist- [facility]   [facility] 12/31/2023, 6:38 PM

## 2024-01-01 ENCOUNTER — Encounter: Payer: Self-pay | Admitting: Internal Medicine

## 2024-01-05 ENCOUNTER — Ambulatory Visit

## 2024-01-21 ENCOUNTER — Encounter

## 2024-02-02 ENCOUNTER — Ambulatory Visit: Attending: Internal Medicine

## 2024-02-02 DIAGNOSIS — M25561 Pain in right knee: Secondary | ICD-10-CM | POA: Insufficient documentation

## 2024-02-02 DIAGNOSIS — G8929 Other chronic pain: Secondary | ICD-10-CM | POA: Insufficient documentation

## 2024-02-02 DIAGNOSIS — R2689 Other abnormalities of gait and mobility: Secondary | ICD-10-CM | POA: Diagnosis present

## 2024-02-02 DIAGNOSIS — M6281 Muscle weakness (generalized): Secondary | ICD-10-CM | POA: Insufficient documentation

## 2024-02-02 DIAGNOSIS — M25562 Pain in left knee: Secondary | ICD-10-CM | POA: Insufficient documentation

## 2024-02-02 NOTE — Therapy (Signed)
 OUTPATIENT PHYSICAL THERAPY KNEE TREATMENT/RECERTIFICATION   Patient Name: Christine Lawson MRN: [id number] DOB:Apr 27, 1960, 63 y.o., female Today's Date: 02/02/2024  END OF SESSION:  PT End of Session - 02/02/24 1513     Visit Number 8    Number of Visits 29    Date for Recertification  02/11/24    Authorization Type [employer]    Progress Note Due on Visit 10    PT Start Time 1515    PT Stop Time 1555    PT Time Calculation (min) 40 min    Activity Tolerance Patient tolerated treatment well    Behavior During Therapy WFL for tasks assessed/performed            Past Medical History:  Diagnosis Date   Diabetes mellitus without complication (HCC)    Endometrial adenocarcinoma (HCC)    GERD (gastroesophageal reflux disease)    Hematochezia    Hyperlipidemia    Hypertension    IDA (iron  deficiency anemia)    Inguinal lymphadenopathy 07/13/2015   Irritable bowel syndrome    Obesity    Osteoarthritis    Sleep apnea    Past Surgical History:  Procedure Laterality Date   ABDOMINAL HYSTERECTOMY  feb 2013   CARPAL TUNNEL RELEASE Left 03/20/2021   Procedure: CARPAL TUNNEL RELEASE ENDOSCOPIC;  Surgeon: Edie Norleen PARAS, MD;  Location: [facility] ORS;  Service: Orthopedics;  Laterality: Left;   COLONOSCOPY WITH PROPOFOL  N/A 08/02/2015   Procedure: COLONOSCOPY WITH PROPOFOL ;  Surgeon: Lamar ONEIDA Holmes, MD;  Location: [facility] ENDOSCOPY;  Service: Endoscopy;  Laterality: N/A;   ESOPHAGOGASTRODUODENOSCOPY (EGD) WITH PROPOFOL  N/A 08/02/2015   Procedure: ESOPHAGOGASTRODUODENOSCOPY (EGD) WITH PROPOFOL ;  Surgeon: Lamar ONEIDA Holmes, MD;  Location: [facility] ENDOSCOPY;  Service: Endoscopy;  Laterality: N/A;   Patient Active Problem List   Diagnosis Date Noted   Hepatic steatosis 05/21/2019   Other proteinuria 01/25/2019   Vitamin D deficiency 04/13/2018   Gastroesophageal reflux disease with esophagitis 01/22/2018   Irritable bowel syndrome with diarrhea 04/03/2016   Lymphedema 01/17/2016   Venous  (peripheral) insufficiency 01/17/2016   Pain in limb 01/17/2016   Iron  deficiency anemia due to chronic blood loss 01/09/2016   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   History of endometrial cancer 08/01/2015   Endometrial adenocarcinoma (HCC) 07/13/2015    Class: History of   Inguinal lymphadenopathy 07/13/2015   Long term current use of insulin (HCC) 12/12/2013   Microalbuminuria 12/12/2013   Diabetes (HCC) 07/28/2013   BP (high blood pressure) 07/28/2013   HLD (hyperlipidemia) 07/28/2013   Adiposity 07/28/2013   Obstructive apnea 07/28/2013   Arthritis, degenerative 07/28/2013   Apnea, sleep 07/28/2013   Thyroid nodule 07/28/2013    PCP: Sadie Manna, MD  REFERRING PROVIDER: Sadie Manna, MD  REFERRING DIAG:  M17.11 (ICD-10-CM) - Unilateral primary osteoarthritis, right knee  M25.562 (ICD-10-CM) - Pain in left knee  R26.2 (ICD-10-CM) - Ambulatory dysfunction  R29.898 (ICD-10-CM) - Bilateral leg weakness  Z91.81 (ICD-10-CM) - At risk for fall due to comorbid condition   RATIONALE FOR EVALUATION AND TREATMENT: Rehabilitation  THERAPY DIAG: Muscle weakness (generalized)  Chronic pain of left knee  Chronic pain of right knee  Other abnormalities of gait and mobility  ONSET DATE: Chronic Knee Pain  FOLLOW-UP APPT SCHEDULED WITH REFERRING PROVIDER: Didn't Address    SUBJECTIVE:  SUBJECTIVE STATEMENT:  Pt is a pleasant 63 y/o F presenting to OPPT w/ chronic R knee pain.  PERTINENT HISTORY:   Pt is a 6 y/p F with chronic R knee pain. Pt reports that she hasn't had a fall.  Pt well known to clinic and has had prior bouts of PT for same condition with known R knee OA. She reports intermittent bouts of LE weakness in RLE/LLE depending on how long she stands. Pt reports 6-7/10 NPS in the  medial aspect of the R knee at rest, and 9/10 NPS with activity. Pt's aggravating factors include prolonged walking, standing, sitting for a long time, step and curb navigation. Relieving factors ice modalities, some gentle ROM exercises. Pt reports intermittent N/T down the RLE. She reports independence with walking and use of [employer] but requires assistance from aid for basic ADLs.      PAIN:   Pain Intensity: Present: 7/10, Best: 7/10, Worst: 9/10 Pain location:  R/L Knee  Pain quality: constant, sharp, and aching  Radiating pain: Yes  Swelling: Yes  Popping, catching, locking: Yes  Numbness/Tingling: Yes Focal weakness or buckling: Yes 24-hour pain behavior: Activity Dependent  History of prior back, hip, or knee injury, pain, surgery, or therapy: Yes Imaging: No  Prior level of function: Independent with household mobility with device, Needs assistance with ADLs, and Needs assistance with homemaking  Red flags: Negative for personal history of cancer, chills/fever, night sweats, nausea, vomiting, unexplained weight gain/loss, unrelenting pain  PRECAUTIONS: Fall  WEIGHT BEARING RESTRICTIONS: No  FALLS: Has patient fallen in last 6 months? No  Living Environment Lives with: lives with their family and lives in an assisted living facility Lives in: House/apartment  Hobbies: Hanging out with sister, reading, TV, walking  Patient Goals:     OBJECTIVE:   Patient Surveys  LEFS: (See below at goals)  Cognition Patient is oriented to person, place, and time.  Recent memory is intact.  Remote memory is intact.  Attention span and concentration are intact.  Expressive speech is intact.  Patient's fund of knowledge is within normal limits for educational level.    Gross Musculoskeletal Assessment Tremor: None Bulk: Normal Tone: Normal Edema: Significant swelling in bilateral lower leg, medial/lateral joint line of the knee  GAIT: Distance walked: 82m Assistive  device utilized: Quad cane large base Level of assistance: Modified independence Comments: Decreased velocity, decrease step length bilaterally, circumduction in RLE, decreased knee flexion bilaterally   Posture: Seated: Rounded Shoulders, Forward posture, increased kyphosis, LE positioned in hip abduction and external rotation  AROM AROM (Normal range in degrees) AROM  Hip Right Left  Flexion (125)    Extension (15)    Abduction (40)    Adduction     Internal Rotation (45)    External Rotation (45)        Knee    Flexion (135) 75 83  Extension (0) 0 0      Ankle    Dorsiflexion (20)    Plantarflexion (50)    Inversion (35)    Eversion (15    (* = pain; Blank rows = not tested)  LE MMT: MMT (out of 5) Right Left  Hip flexion 3+ 3+  Hip extension    Hip abduction    Hip adduction    Hip internal rotation 3+ 3+  Hip external rotation 3+ 3+  Knee flexion 4 4  Knee extension 4 4  Ankle dorsiflexion 5 5  Ankle plantarflexion 5 5  Ankle  inversion    Ankle eversion    (* = pain; Blank rows = not tested)  Sensation Grossly intact to light touch bilateral LEs as determined by testing dermatomes L2-S2. Proprioception, and hot/cold testing deferred on this date.  Reflexes Deferred  Muscle Length Hamstring: Deferred Quadriceps: Deferred  Palpation Location LEFT  RIGHT           Quadriceps 1 1  Medial Hamstrings 1 1  Lateral Hamstrings    Lateral Hamstring tendon 1 1  Medial Hamstring tendon 1 1  Quadriceps tendon    Patella    Patellar Tendon 1 1  Tibial Tuberosity    Medial joint line 2 2  Lateral joint line 2 2  MCL 1 1  LCL 1 1  Adductor Tubercle    Pes Anserine tendon    Infrapatellar fat pad    Fibular head    Popliteal fossa    (Blank rows = not tested) Graded on 0-4 scale (0 = no pain, 1 = pain, 2 = pain with wincing/grimacing/flinching, 3 = pain with withdrawal, 4 = unwilling to allow palpation), (Blank rows = not tested)  Passive Accessory  Motion Deferred   SPECIAL TESTS  MCL: Valgus Stress (30 degrees flexion): R: Positive for concordant pain L: Negative  LCL: Varus Stress (30 degrees flexion): R: Positive for concordant pain L: Negavtive  Functional Tests:   5XSTS: 12.67s  :  Stairs: Level of Assistance: Complete Independence, [employer] Stair Negotiation Technique: Step to Pattern with Bilateral Rails Number of Stairs: 4   TODAY'S TREATMENT: DATE: 02/02/24   Subjective: Patient reports 7/10 NPS in the R knee. Patient reports that she went to her heart doctor to clear her chest pain. She reports that the chest pain is from acid reflux. No further questions or concerns   Therapeutic Exercise:  Seated Knee Extension    R/L: 3 x 15 - 5# AW    Seated Hamstring Curl   R/L: 2 x 10 - Green TB around ankle    Standing Heel Raises   3 x 15 reps   Reviewed HEP and and provided updated printout. Encouraged adherence for hamstring stretches in order to reduce stiffness in R/L knee joint and muscle tension..   Therapeutic Activity:  NuStep L4-1 x 6 min x UE/LE (Seat 9) x > 100 SPM for LE endurance and strength for walking; PT manually adjusted per patient tolerance.    TRX Squats for Squatting Capacity  3 x 8   Sit to Stand with Med ball throw (2 Kg MB)  1 x 10 - 20 Elevated Mat Table   1 x 12- 20 Elevated mat table   5 min unbilled due to restroom break mid session.   PATIENT EDUCATION:  Education details: HEP, Exercise Technique  Person educated: Patient Education method: Explanation, Demonstration, and Handouts Education comprehension: verbalized understanding and returned demonstration   HOME EXERCISE PROGRAM:  Access Code: [email or web address] URL: [email or web address] Date: 12/31/2023 Prepared by: Lonni Pall  Exercises - Supine Heel Slide with Strap  - 1 x daily - 7 x weekly - 3 sets - 10 reps - Seated Long Arc Quad  - 1 x daily - 7 x weekly - 3 sets - 12-15 reps - Seated Heel  Raise  - 1 x daily - 7 x weekly - 3 sets - 12-15 reps - Seated Hamstring Stretch  - 1 x daily - 7 x weekly - 3 sets - 10-15s hold - Sit to Stand with Armchair  -  1 x daily - 7 x weekly - 3 sets - 8-10 reps - Heel Raises with Counter Support  - 1 x daily - 7 x weekly - 3 sets - 8-10 reps  Access Code: [email or web address] URL: [email or web address] Date: 08/04/2023 Prepared by: Lonni Bonna Steury  Exercises - Supine Heel Slide with Strap  - 1 x daily - 7 x weekly - 3 sets - 10 reps - Seated Long Arc Quad  - 1 x daily - 7 x weekly - 3 sets - 12-15 reps - Seated Heel Raise  - 1 x daily - 7 x weekly - 3 sets - 12-15 reps - Seated Hamstring Stretch  - 1 x daily - 7 x weekly - 3 sets - 10-15s hold    ASSESSMENT:  CLINICAL IMPRESSION:   Continued PT POC focused on management of bilateral knee pain. Pt continues to need regular seated rest breaks due to muscle fatigue. Patient with good ability to perform squats with increased repetitions however limited due to severe pain in bilateral knees. She continues to remain motivated to improve functional tasks however has poor adherence to HEP. Based on today's session she will benefit from skilled PT interventions in order to facilitate return to PLOF and improve QoL.   OBJECTIVE IMPAIRMENTS: Abnormal gait, decreased activity tolerance, decreased balance, decreased endurance, decreased mobility, difficulty walking, decreased ROM, decreased strength, increased edema, increased muscle spasms, impaired flexibility, and pain.   ACTIVITY LIMITATIONS: carrying, lifting, bending, standing, squatting, sleeping, stairs, transfers, bed mobility, bathing, toileting, dressing, and locomotion level  PARTICIPATION LIMITATIONS: cleaning, driving, shopping, community activity, and yard work  PERSONAL FACTORS: Age, Behavior pattern, Past/current experiences, Time since onset of injury/illness/exacerbation, and 3+ comorbidities: HTN, DM, Venous insufficiency are also  affecting patient's functional outcome.   REHAB POTENTIAL: Fair Multiple chronic co morbidities   CLINICAL DECISION MAKING: Evolving/moderate complexity  EVALUATION COMPLEXITY: Moderate   GOALS: Goals reviewed with patient? Yes  SHORT TERM GOALS: Target date:  09/01/2023   Pt will be independent with HEP to improve strength and decrease knee pain to improve pain-free function at home and work. Baseline: 50% adherence  Goal status: PRogressing   LONG TERM GOALS: Target date: 02/11/2024  Pt will increase by at least 0.13 m/s in order to demonstrate clinically significant improvement in community ambulation.   Baseline: 10/26/2023: .47 m/s (Quad Cane); 12/31/2023: .45 m/s  Goal status: Progressing  2.  Pt will decrease worst knee pain by at least 3 points on the NPRS in order to demonstrate clinically significant reduction in knee pain. Baseline: 9/10 NPS; 10/26/2023: 8/10 NPS; 12/31/2023: 8/10 NPS Goal status: Progressing  3.  Pt will increase LEFS score by at least 9 points in order demonstrate clinically significant reduction in knee pain/disability.       Baseline: 30 / 80 = 37.5 %; 10/26/2023: Not documented; 12/31/2023: 41/80 (80/80 = no disability) Goal status: Progressing   4.  Pt will increase strength of bilateral Hip Flexion, IR/ER, knee ext/flex by at least 1/2 MMT grade in order to demonstrate improvement in strength and function  Baseline: 08/04/2023: R/L    R L  R/L 10/26/2023  Hip flexion 3+ 3+ 4-/4-  Hip internal rotation 3+ 3+ 3+/3+  Hip external rotation 3+ 3+ 3+/3+  Knee flexion 4 4 4/4  Knee extension 4 4 4/4   Goal status: Progressing   PLAN: PT FREQUENCY: 1-2x/week  PT DURATION: 6 weeks  PLANNED INTERVENTIONS: Therapeutic exercises, Therapeutic activity, Neuromuscular re-education, Balance training, Gait training, Patient/Family  education, Self Care, Joint mobilization, Joint manipulation, Vestibular training, Canalith repositioning,  Orthotic/Fit training, DME instructions, Dry Needling, Electrical stimulation, Spinal manipulation, Spinal mobilization, Cryotherapy, Moist heat, Taping, Traction, Manual therapy, and Re-evaluation.  PLAN FOR NEXT SESSION: Review HEP; Progress functional strength; Progress RLE/LLE ROM   Lonni Pall PT, DPT Physical Therapist- [facility] 02/02/2024, 3:13 PM

## 2024-02-03 ENCOUNTER — Ambulatory Visit

## 2024-02-11 ENCOUNTER — Ambulatory Visit

## 2024-02-23 ENCOUNTER — Ambulatory Visit

## 2024-02-29 ENCOUNTER — Ambulatory Visit

## 2024-03-07 ENCOUNTER — Ambulatory Visit

## 2024-03-07 ENCOUNTER — Ambulatory Visit
Admission: RE | Admit: 2024-03-07 | Discharge: 2024-03-07 | Disposition: A | Discharge status: Home / Self Care | Source: Ambulatory Visit | Attending: Internal Medicine | Admitting: Internal Medicine

## 2024-03-07 DIAGNOSIS — Z1231 Encounter for screening mammogram for malignant neoplasm of breast: Secondary | ICD-10-CM | POA: Diagnosis present

## 2024-03-15 ENCOUNTER — Ambulatory Visit

## 2024-03-29 ENCOUNTER — Ambulatory Visit

## 2024-04-06 ENCOUNTER — Ambulatory Visit

## 2024-04-07 ENCOUNTER — Other Ambulatory Visit: Payer: Self-pay | Admitting: Surgery

## 2024-04-07 DIAGNOSIS — C50411 Malignant neoplasm of upper-outer quadrant of right female breast: Secondary | ICD-10-CM

## 2024-04-15 ENCOUNTER — Ambulatory Visit

## 2024-04-25 ENCOUNTER — Other Ambulatory Visit

## 2024-04-25 ENCOUNTER — Ambulatory Visit: Admitting: Internal Medicine
